# Patient Record
Sex: Female | Born: 1950 | Race: Black or African American | Hispanic: No | Marital: Married | Smoking: Former smoker
Health system: Southern US, Community
[De-identification: ages and names within clinical notes are randomized; demographics above are authoritative.]

## PROBLEM LIST (undated history)

## (undated) DIAGNOSIS — K219 Gastro-esophageal reflux disease without esophagitis: Secondary | ICD-10-CM

## (undated) DIAGNOSIS — F0390 Unspecified dementia without behavioral disturbance: Secondary | ICD-10-CM

## (undated) DIAGNOSIS — I10 Essential (primary) hypertension: Secondary | ICD-10-CM

## (undated) DIAGNOSIS — A0472 Enterocolitis due to Clostridium difficile, not specified as recurrent: Secondary | ICD-10-CM

## (undated) DIAGNOSIS — C649 Malignant neoplasm of unspecified kidney, except renal pelvis: Secondary | ICD-10-CM

## (undated) DIAGNOSIS — C55 Malignant neoplasm of uterus, part unspecified: Secondary | ICD-10-CM

## (undated) DIAGNOSIS — N289 Disorder of kidney and ureter, unspecified: Secondary | ICD-10-CM

## (undated) DIAGNOSIS — D649 Anemia, unspecified: Secondary | ICD-10-CM

## (undated) HISTORY — PX: AV FISTULA PLACEMENT: SHX1204

## (undated) HISTORY — PX: ABDOMINAL HYSTERECTOMY: SHX81

## (undated) HISTORY — PX: KIDNEY TRANSPLANT: SHX239

## (undated) HISTORY — PX: BREAST BIOPSY: SHX20

## (undated) HISTORY — PX: NEPHRECTOMY TRANSPLANTED ORGAN: SUR880

---

## 2014-02-21 ENCOUNTER — Inpatient Hospital Stay: Payer: Self-pay | Admitting: Internal Medicine

## 2014-02-21 LAB — COMPREHENSIVE METABOLIC PANEL
ANION GAP: 11 (ref 7–16)
AST: 21 U/L (ref 15–37)
Albumin: 2.6 g/dL — ABNORMAL LOW (ref 3.4–5.0)
Alkaline Phosphatase: 63 U/L
BUN: 16 mg/dL (ref 7–18)
Bilirubin,Total: 0.5 mg/dL (ref 0.2–1.0)
CHLORIDE: 104 mmol/L (ref 98–107)
CO2: 25 mmol/L (ref 21–32)
Calcium, Total: 8.2 mg/dL — ABNORMAL LOW (ref 8.5–10.1)
Creatinine: 1.68 mg/dL — ABNORMAL HIGH (ref 0.60–1.30)
EGFR (Non-African Amer.): 33 — ABNORMAL LOW
GFR CALC AF AMER: 40 — AB
Glucose: 120 mg/dL — ABNORMAL HIGH (ref 65–99)
Osmolality: 282 (ref 275–301)
POTASSIUM: 3.2 mmol/L — AB (ref 3.5–5.1)
SGPT (ALT): 14 U/L
Sodium: 140 mmol/L (ref 136–145)
TOTAL PROTEIN: 7 g/dL (ref 6.4–8.2)

## 2014-02-21 LAB — URINALYSIS, COMPLETE
Bilirubin,UR: NEGATIVE
GLUCOSE, UR: NEGATIVE mg/dL (ref 0–75)
Nitrite: POSITIVE
Ph: 6 (ref 4.5–8.0)
Protein: 100
RBC,UR: 2 /HPF (ref 0–5)
SPECIFIC GRAVITY: 1.013 (ref 1.003–1.030)
Squamous Epithelial: 8
WBC UR: 666 /HPF (ref 0–5)

## 2014-02-21 LAB — CBC
HCT: 31.7 % — ABNORMAL LOW (ref 35.0–47.0)
HGB: 10.2 g/dL — ABNORMAL LOW (ref 12.0–16.0)
MCH: 24.4 pg — ABNORMAL LOW (ref 26.0–34.0)
MCHC: 32.3 g/dL (ref 32.0–36.0)
MCV: 76 fL — AB (ref 80–100)
PLATELETS: 140 10*3/uL — AB (ref 150–440)
RBC: 4.19 10*6/uL (ref 3.80–5.20)
RDW: 14.8 % — ABNORMAL HIGH (ref 11.5–14.5)
WBC: 15.2 10*3/uL — AB (ref 3.6–11.0)

## 2014-02-22 LAB — BASIC METABOLIC PANEL
ANION GAP: 10 (ref 7–16)
BUN: 16 mg/dL (ref 7–18)
CALCIUM: 8.7 mg/dL (ref 8.5–10.1)
CO2: 25 mmol/L (ref 21–32)
Chloride: 105 mmol/L (ref 98–107)
Creatinine: 1.76 mg/dL — ABNORMAL HIGH (ref 0.60–1.30)
EGFR (African American): 38 — ABNORMAL LOW
EGFR (Non-African Amer.): 31 — ABNORMAL LOW
Glucose: 116 mg/dL — ABNORMAL HIGH (ref 65–99)
Osmolality: 282 (ref 275–301)
Potassium: 3.5 mmol/L (ref 3.5–5.1)
Sodium: 140 mmol/L (ref 136–145)

## 2014-02-22 LAB — CBC WITH DIFFERENTIAL/PLATELET
BASOS ABS: 0.1 10*3/uL (ref 0.0–0.1)
Basophil %: 0.8 %
EOS PCT: 0 %
Eosinophil #: 0 10*3/uL (ref 0.0–0.7)
HCT: 33.8 % — AB (ref 35.0–47.0)
HGB: 10.7 g/dL — ABNORMAL LOW (ref 12.0–16.0)
LYMPHS ABS: 1.1 10*3/uL (ref 1.0–3.6)
LYMPHS PCT: 7.3 %
MCH: 24.3 pg — ABNORMAL LOW (ref 26.0–34.0)
MCHC: 31.6 g/dL — ABNORMAL LOW (ref 32.0–36.0)
MCV: 77 fL — ABNORMAL LOW (ref 80–100)
Monocyte #: 0.9 x10 3/mm (ref 0.2–0.9)
Monocyte %: 6.2 %
NEUTROS ABS: 13 10*3/uL — AB (ref 1.4–6.5)
Neutrophil %: 85.7 %
PLATELETS: 131 10*3/uL — AB (ref 150–440)
RBC: 4.39 10*6/uL (ref 3.80–5.20)
RDW: 15.1 % — AB (ref 11.5–14.5)
WBC: 15.1 10*3/uL — ABNORMAL HIGH (ref 3.6–11.0)

## 2014-02-22 LAB — PHOSPHORUS: PHOSPHORUS: 3.7 mg/dL (ref 2.5–4.9)

## 2014-02-23 LAB — RENAL FUNCTION PANEL
Albumin: 2.1 g/dL — ABNORMAL LOW (ref 3.4–5.0)
Anion Gap: 8 (ref 7–16)
BUN: 14 mg/dL (ref 7–18)
CALCIUM: 7.8 mg/dL — AB (ref 8.5–10.1)
CHLORIDE: 106 mmol/L (ref 98–107)
Co2: 26 mmol/L (ref 21–32)
Creatinine: 1.35 mg/dL — ABNORMAL HIGH (ref 0.60–1.30)
EGFR (Non-African Amer.): 42 — ABNORMAL LOW
GFR CALC AF AMER: 51 — AB
Glucose: 90 mg/dL (ref 65–99)
Osmolality: 279 (ref 275–301)
PHOSPHORUS: 2.3 mg/dL — AB (ref 2.5–4.9)
POTASSIUM: 3.2 mmol/L — AB (ref 3.5–5.1)
Sodium: 140 mmol/L (ref 136–145)

## 2014-02-23 LAB — CBC WITH DIFFERENTIAL/PLATELET
BASOS PCT: 0.9 %
Basophil #: 0.1 10*3/uL (ref 0.0–0.1)
EOS PCT: 0.5 %
Eosinophil #: 0 10*3/uL (ref 0.0–0.7)
HCT: 28.5 % — AB (ref 35.0–47.0)
HGB: 9.1 g/dL — AB (ref 12.0–16.0)
LYMPHS ABS: 0.6 10*3/uL — AB (ref 1.0–3.6)
LYMPHS PCT: 6.6 %
MCH: 24.3 pg — ABNORMAL LOW (ref 26.0–34.0)
MCHC: 31.9 g/dL — ABNORMAL LOW (ref 32.0–36.0)
MCV: 76 fL — ABNORMAL LOW (ref 80–100)
MONOS PCT: 11.6 %
Monocyte #: 1.1 x10 3/mm — ABNORMAL HIGH (ref 0.2–0.9)
Neutrophil #: 7.3 10*3/uL — ABNORMAL HIGH (ref 1.4–6.5)
Neutrophil %: 80.4 %
Platelet: 115 10*3/uL — ABNORMAL LOW (ref 150–440)
RBC: 3.73 10*6/uL — AB (ref 3.80–5.20)
RDW: 14.6 % — ABNORMAL HIGH (ref 11.5–14.5)
WBC: 9 10*3/uL (ref 3.6–11.0)

## 2014-02-23 LAB — URINE CULTURE

## 2014-02-23 LAB — CLOSTRIDIUM DIFFICILE(ARMC)

## 2014-02-24 LAB — BASIC METABOLIC PANEL
ANION GAP: 6 — AB (ref 7–16)
BUN: 14 mg/dL (ref 7–18)
Calcium, Total: 7.8 mg/dL — ABNORMAL LOW (ref 8.5–10.1)
Chloride: 105 mmol/L (ref 98–107)
Co2: 28 mmol/L (ref 21–32)
Creatinine: 1.38 mg/dL — ABNORMAL HIGH (ref 0.60–1.30)
EGFR (African American): 50 — ABNORMAL LOW
EGFR (Non-African Amer.): 41 — ABNORMAL LOW
Glucose: 154 mg/dL — ABNORMAL HIGH (ref 65–99)
Osmolality: 281 (ref 275–301)
Potassium: 2.8 mmol/L — ABNORMAL LOW (ref 3.5–5.1)
SODIUM: 139 mmol/L (ref 136–145)

## 2014-02-24 LAB — FOLATE: FOLIC ACID: 25.9 ng/mL — AB (ref 3.1–17.5)

## 2014-02-24 LAB — TSH: THYROID STIMULATING HORM: 0.41 u[IU]/mL — AB

## 2014-02-25 LAB — URINALYSIS, COMPLETE
BACTERIA: NONE SEEN
Bilirubin,UR: NEGATIVE
GLUCOSE, UR: NEGATIVE mg/dL (ref 0–75)
Ketone: NEGATIVE
LEUKOCYTE ESTERASE: NEGATIVE
Nitrite: NEGATIVE
PROTEIN: NEGATIVE
Ph: 6 (ref 4.5–8.0)
Specific Gravity: 1.009 (ref 1.003–1.030)
Squamous Epithelial: 1

## 2014-02-25 LAB — T4, FREE: FREE THYROXINE: 1.37 ng/dL (ref 0.76–1.46)

## 2014-02-25 LAB — BASIC METABOLIC PANEL
Anion Gap: 8 (ref 7–16)
BUN: 11 mg/dL (ref 7–18)
CALCIUM: 7.7 mg/dL — AB (ref 8.5–10.1)
CO2: 24 mmol/L (ref 21–32)
Chloride: 110 mmol/L — ABNORMAL HIGH (ref 98–107)
Creatinine: 1.22 mg/dL (ref 0.60–1.30)
EGFR (African American): 57 — ABNORMAL LOW
EGFR (Non-African Amer.): 47 — ABNORMAL LOW
GLUCOSE: 90 mg/dL (ref 65–99)
Osmolality: 282 (ref 275–301)
POTASSIUM: 3.3 mmol/L — AB (ref 3.5–5.1)
Sodium: 142 mmol/L (ref 136–145)

## 2014-02-26 LAB — CULTURE, BLOOD (SINGLE)

## 2014-06-15 NOTE — H&P (Signed)
PATIENT NAME:  [name], [name] MR#:  [id number] DATE OF BIRTH:  Oct 28, 1950  DATE OF ADMISSION:  02/21/2014  REFERRING DOCTOR: Lennette Bihari A. Paduchowski, MD   PRIMARY CARE DOCTOR: Nonlocal.   ADMITTING DOCTOR: Juluis Mire, MD   CHIEF COMPLAINT:  1.  Fever and malaise  for the past 2 days.  2.  Dysuria for the past 3 to 4 days.   HISTORY OF PRESENT ILLNESS: A 64 year old African American female recently moved to [place] from [place] about 5 days ago, states that she has been having some urinary symptoms, namely frequency, urgency with dysuria for the past 3 to 4 days and this morning was noted to develop generalized weakness with malaise and fever. Hence brought to the Emergency Room for further evaluation. The  patient was residing in [place] and recently moved to [place] early part of this weekend, currently does not have a local doctor and is scheduled to be seen at [facility] for which she has an appointment in February 2016. Meanwhile she developed these urinary symptoms and fever with malaise. She does have a history of urinary tract infections. Last urinary tract infection was about 2 weeks ago for which she was treated with oral antibiotics and she does not know the name of the antibiotics. She does have some chills but denies any chest pain, no cough, no nausea, no vomiting, no diarrhea and no abdominal pain. She denies any blood in the urine. The patient has a history of renal transplantation done about 5 years ago and since then she has been doing okay until about a few months ago, that was about a couple of months ago, she had the first urinary tract infection, which was treated successfully with oral antibiotics and subsequently she had another urinary tract infection about 2 weeks ago, was treated with oral antibiotics at that time too.   In the Emergency Room, the patient was evaluated by the ED physician and was found to have a temperature of 102.3 degrees  Fahrenheit and the lab work revealed elevated white blood cell count of 15.2 and grossly abnormal urinalysis suggestive of urinary tract infection. Hence, hospitalist service was consulted for further evaluation and management. Blood cultures were obtained and patient was also started on IV ceftriaxone.    PAST MEDICAL HISTORY:  1.  Hypertension.  2.  Status post renal transplantation done about 5 years ago.  3.  History of renal cell carcinoma, status post left nephrectomy about 6 years ago.    PAST SURGICAL HISTORY:  1.  Status post left nephrectomy 6 years ago for renal carcinoma.  2.  Status post renal transplantation done about 5 years ago.  3.  Status post cholecystectomy in the remote past.   ALLERGIES: LATEX.   FAMILY HISTORY: Two brothers with end-stage renal disease, one sister with anemia and mother with hypertension and diabetes runs in many family members.   SOCIAL HISTORY: She is married, currently disabled, history of smoking in the past, quit about 20 years ago. Denies any alcohol or substance abuse. She just moved from [place] to [place] and currently living with her daughter.   HOME MEDICATIONS:  1.  Calcitriol 0.25 mcg oral capsule, 1 capsule orally once a day.  2.  Folic acid 1 mg oral tablet, 1 tablet orally once a day.  3.  Metoprolol 25 mg tablet orally, 1 tablet orally once a day.  4.  Mycophenolic acid 99991111 mg oral delayed release capsule, 1 capsule orally 2 times  a day.  5.  Ondansetron 4 mg oral tablet, 1 tablet orally every 8 hours as needed for nausea, vomiting.  6.  Prednisone 5 mg oral tablet, 1 tablet orally once a day.  7.  Sirolimus 1 mg tablet, 6 tablets orally once a day.    REVIEW OF SYSTEMS:  CONSTITUTIONAL: Positive for fever and chills and positive for generalized weakness and fatigue of 1 to 2 days' duration.  EYES: Negative for blurred vision, double vision. No pain. No redness. No discharge.  EARS, NOSE, AND THROAT: Negative for  tinnitus, ear pain, hearing loss, epistaxis, nasal discharge, difficulty swallowing.  RESPIRATORY: Negative for cough, wheezing, dyspnea, hemoptysis, painful respiration.  CARDIOVASCULAR: Negative for chest pain, palpitations, dizziness, syncopal episodes, orthopnea, dyspnea on exertion, pedal edema.  GASTROINTESTINAL: Negative for nausea, vomiting, diarrhea, abdominal pain, constipation, hematemesis, melena, rectal bleeding, GERD symptoms, change in bowel habits.  GENITOURINARY: Positive for dysuria, frequency and urgency for the past 4 to 5 days. No hematuria.  ENDOCRINE: Negative for polyuria, nocturia. No heat or cold intolerance.  HEMATOLOGIC AND LYMPHATICS: Negative for easy bruising, bleeding, swollen glands.  INTEGUMENTARY: Negative for acne, skin rash, or lesions.  MUSCULOSKELETAL: Negative for neck pain, back pain or shoulder pain. No history of arthritis, gout.  NEUROLOGICAL: Negative for focal weakness or numbness. No tremors. No history of CVA, TIA, seizure disorder.  PSYCHIATRIC: Negative for anxiety, insomnia, depression.   PHYSICAL EXAMINATION:  VITAL SIGNS: Temperature 102.3 degrees Fahrenheit, pulse rate of 100 per minute, respirations 27 per minute, blood pressure 152/89, O2 saturation is 96% on room air.  GENERAL: Well-built, well-nourished, alert, in no acute distress, comfortably resting in the bed at this time.  HEAD: Atraumatic, normocephalic.  EYES: Pupils equal, react to light and accommodation, no conjunctival pallor, no scleral icterus, extraocular movements intact.  NOSE: No drainage. No external lesions.  EARS: No drainage. No external lesions.  ORAL CAVITY: No mucosal lesions. No exudates.  NECK: Supple. No JVD. No thyromegaly. No carotid bruit. Range of motion of neck within normal limits.  RESPIRATORY: Good respiratory effort. Not using accessory muscles of respiration. Bilateral vesicular breath sounds present. No rales or rhonchi.  CARDIOVASCULAR: S1, S2  regular. No murmurs, gallops, or clicks appreciated. Peripheral pulses equal at carotid, femoral, and pedal pulses, no peripheral edema.  GASTROINTESTINAL: Abdomen soft, nontender. No hepatosplenomegaly. No masses. No rigidity. No guarding. Bowel sounds present and equal in all 4 quadrants. Transplanted kidney palpable in the right lower abdomen, no local tenderness.  GENITOURINARY: Deferred.  MUSCULOSKELETAL: No joint or tenderness or effusion. Range of motion adequate. Strength and tone equal bilaterally.  SKIN: Inspection within normal limits. No obvious wound.   LYMPHATIC: No cervical lymphadenopathy.  VASCULAR: Good dorsalis pedis and posterior tibial pulses. AV fistula on the left forearm with a palpable bruit present.  NEUROLOGICAL: Alert, awake, and oriented x 3. Cranial nerves II through XII grossly intact. No sensory deficit. Motor strength 5/5 in both upper and lower extremities. DTRs 2+ bilateral and symmetrical. Plantars downgoing.  PSYCHIATRIC: Alert, awake, and oriented x 3, judgment,  insight adequate.    ANCILLARY DATA:  LABS:  Serum glucose 120, BUN 16, creatinine 1.68, sodium 140, potassium 3.2, chloride 104, bicarbonate 25, total calcium 8.2, total protein 7.0, albumin 2.6, total bilirubin 0.5, alkaline phosphatase 63, AST is 21, ALT 14, WBC 15.2, hemoglobin 10.2, hematocrit 31.7, platelet count 140. Urine analysis: Cloudy, nitrite positive, leukocyte esterase 3+, WBCs 666 per high-power field, bacteria 2+, WBC clumps present.  IMAGING STUDY:  Chest x-ray:   No active disease.   ASSESSMENT AND PLAN: A 64 year old [place] American female with a past medical history of renal transplantation done about 5 years ago, hypertension, and history of renal carcinoma, status post left nephrectomy in the past, recently moved from [place] to [place], presents with fever, malaise of 2 day duration and dysuria for the past 3 to 4 days, found to have a fever of 102 degrees Fahrenheit  with leukocytosis and abnormal urinalysis consistent with urinary tract infection, rule out pyelonephritis.  1.  Urinary tract infection, rule out pyelonephritis.  Plan: Admit to medical floor. Blood and urine cultures obtained. IV antibiotics, Rocephin, Tylenol, follow up cultures and order renal ultrasound in view of transplanted kidney.  2.  History of renal transplantation done about 5 years ago, on immunosuppression medications. The patient is stable. Continue home medications.  3.  Hypertension, stable on home medications. Continue same.  4.  Elevated creatinine of 1.6. Baseline creatinine not known, acute versus chronic. Plan: Gentle IV hydration, monitor BMP, avoid nephrotoxic agents, obtain old medical records.  5.  Hypokalemia, mild. Plan: Potassium replacement, follow up BMP.  6.  Anemia, mild, seems chronic but obtain old medical records and monitor CBC and consider further workup accordingly.  7.  Deep vein thrombosis prophylaxis, subcutaneous Lovenox.  8.  Gastrointestinal prophylaxis- proton pump inhibitor.   CODE STATUS: Full code.   TIME SPENT: 55 minutes.   ____________________________ Juluis Mire, MD enr:AT D: 02/22/2014 00:54:08 ET T: 02/22/2014 01:25:11 ET JOB#: [id number]  cc: Juluis Mire, MD, <Dictator> Juluis Mire MD ELECTRONICALLY SIGNED 02/22/2014 15:34

## 2014-06-15 NOTE — Discharge Summary (Signed)
PATIENT NAME:  [name], BOLAN MR#:  [id number] DATE OF BIRTH:  1950-12-30  DATE OF ADMISSION:  02/21/2014 DATE OF DISCHARGE:  02/25/2014  ADMITTING PHYSICIAN: Juluis Mire, MD  DISCHARGING PHYSICIAN: Gladstone Lighter, MD   PRIMARY CARE PHYSICIAN: Nonlocal, in [place].  CONSULTATIONS IN THE HOSPITAL:  1.  Nephrology consultation with Mamie Levers, MD.  2.  Psychiatric consultation with Gonzella Lex, MD.  3.  Neurology consultation by Mila Homer Tamala Julian, MD.  DISCHARGE DIAGNOSES:  1.  Metabolic encephalopathy.  2.  Pseudodementia.  3.  Depression.  4.  Cognitive impairment.  5.  Acute renal failure.  6.  History of chronic kidney disease, status post renal transplant.  7.  Hypertension.  8.  Escherichia coli urinary tract infection.   DISCHARGE HOME MEDICATIONS:  1.  Prednisone 5 mg p.o. daily.  2.  Metoprolol 25 mg p.o. daily.  3.  Zofran 4 mg q. 8 hours p.r.n. for nausea and vomiting.  4.  Calcitriol 0.25 mcg oral capsule daily.  5.  Folic acid 1 mg p.o. daily.  6.  Sirolimus 6 mg p.o. daily.  7.  Mycophenolic acid 99991111 mg p.o. b.i.d.  8.  Levaquin 250 mg p.o. daily for 3 more days.  9.  Remeron 30 mg p.o. at bedtime.   DISCHARGE DIET: Low-sodium diet.   DISCHARGE ACTIVITY: As tolerated.    FOLLOWUP INSTRUCTIONS:  1.  Follow up with Dr. Juleen China in 1 week.  2.  PCP followup in 3 weeks.  3.  Outpatient physical therapy if needed.   LABORATORY AND IMAGING STUDIES PRIOR TO DISCHARGE: Urinalysis negative for any infection. Sodium 142, potassium 3.3, chloride 110, bicarbonate 24, BUN 11, creatinine 1.2, glucose 90, and calcium of 7.7. Free T4 is within normal limits at 1.37. Vitamin B12 is at 410 pg/mL. RPR test is nonreactive. Folic acid is elevated at 25. Stool for Clostridium difficile is negative. WBC 9.8, hemoglobin 9.1, hematocrit 28.3, platelet count 115,000.   Ultrasound of the kidneys bilaterally showing right kidney and left kidney are not  visualized. Right lower quadrant transplant kidney demonstrates diffusely increased parenchymal echotexture with loss of normal corticomedullary distinction.   Blood cultures are negative. Urine cultures growing pansensitive Escherichia coli.   Admission creatinine at 1.68, with a positive urine on admission.   BRIEF HOSPITAL COURSE: [name] is a 64 year old African American female with past medical history significant for end-stage renal disease, status post renal transplant, and hypertension who was brought in by her daughter secondary to fever, malaise and also cognitive impairment.  1.  Metabolic encephalopathy. Her change in mental status initially was thought secondary to metabolic encephalopathy from a urinary tract infection. Urine cultures were growing pansensitive Escherichia coli. She was appropriately treated and she will take Levaquin for 3 more days to finish off her course. However, her daughter was saying that the patient has been having waxing and waning mental status for almost 3 weeks; that was the reason the daughter got her from [place], as the patient was staying by herself and was having some memory issues. Initially thought to be dementia. CT of the head was done, which did not show any acute changes. Seen by a neurologist, who felt this was also due to dementia, triggered by a lot of stressors and worsened anxiety in life. A psychiatric consult was requested, and the patient was discharged on mirtazapine and was requested to follow up as an outpatient. However, with treatment of her underlying UTI, her mental status has  improved a lot, and she was oriented prior to discharge.  2.  Acute renal failure with history of renal transplant. Baseline creatinine not known; however, with gentle hydration and treatment of her urinary tract infection, creatinine improved up to 1.2 prior to discharge. She will follow with nephrology as an outpatient. All of her transplant medications were  continued at this time.   Her course has been otherwise uneventful in the hospital. She worked with physical therapy, who recommended outpatient services.   DISCHARGE CONDITION: Stable.   DISCHARGE DISPOSITION: Home.   TIME SPENT ON DISCHARGE: 40 minutes.    ____________________________ Gladstone Lighter, MD rk:MT D: 02/26/2014 11:24:00 ET T: 02/26/2014 15:14:41 ET JOB#: [id number]  cc: Gladstone Lighter, MD, <Dictator> Gladstone Lighter MD ELECTRONICALLY SIGNED 03/03/2014 16:19

## 2014-06-15 NOTE — Consult Note (Signed)
Referring Physician:  Azucena Freed :   Primary Care Physician:  Azucena Freed : [facility], [address], [place], [place] [address], [place] [phone]  Reason for Consult: Admit Date: 23-Feb-2014  Chief Complaint: confusion  Reason for Consult: confusion   History of Present Illness: History of Present Illness:   64 yo RHD F presents to [facility] from home due to increasing confusion.  Per daughter, pt originally lived in [place] but there have been a lot of health and personal issues going on so she bought pt back to [place].  Per family, over the past 3 weeks, pt has had a gradual decline in her mental status and has been forgetting more.  Pt knows that she is forgetting more and its stressing her out as well.  Pt denies headache or weakness or numbness.  Pt does report feeling sad.  ROS:  General fatigue   HEENT no complaints   Lungs no complaints   Cardiac no complaints   GI no complaints   GU no complaints   Musculoskeletal no complaints   Extremities no complaints   Skin no complaints   Neuro no complaints   Endocrine no complaints   Psych depression   Past Medical/Surgical Hx:  hypertension:   kidney transplant:   Past Medical/ Surgical Hx:  Past Medical History reviewed by me as above   Past Surgical History reviewed by me as above   Home Medications: Medication Instructions Last Modified Date/Time  calcitriol 0.25 mcg oral capsule 1 cap(s) orally once a day 08-Jan-16 23:10  predniSONE 5 mg oral tablet 1 tab(s) orally once a day 08-Jan-16 23:10  metoprolol 25 mg oral tablet 1 tab(s) orally once a day 08-Jan-16 23:10  sirolimus 1 mg oral tablet 6 tab(s) orally once a day 09-MM[facility]-68 08:81  folic acid 1 mg oral tablet 1 tab(s) orally once a day 08-Jan-16 10:31  mycophenolic acid 594 mg oral delayed release tablet 1 tab(s) orally 2 times a day 08-Jan-16 23:10  ondansetron 4 mg oral tablet 1 tab(s) orally every 8 hours, As Needed - for  Nausea, Vomiting 08-Jan-16 23:10   Allergies:  Latex: Hives  Allergies:  Allergies NKDA   Social/Family History: Employment Status: disabled  Lives With: children  Living Arrangements: apartment  Social History: no tob, no EtOH, no illicits  Family History: no seizures, no stroke   Vital Signs: **Vital Signs.:   11-Jan-16 08:26  Vital Signs Type Routine  Temperature Temperature (F) 98.2  Celsius 36.7  Temperature Source oral  Pulse Pulse 74  Respirations Respirations 18  Systolic BP Systolic BP 585  Diastolic BP (mmHg) Diastolic BP (mmHg) 71  Mean BP 83  Pulse Ox % Pulse Ox % 99  Pulse Ox Activity Level  At rest  Oxygen Delivery Room Air/ 21 %   Physical Exam: General: overweight, NAD, slightly depressed mood  HEENT: normocephalic, sclera nonicteric, oropharynx clear  Neck: supple, no JVD, no bruits  Chest: CTA B, no wheezing, good movement  Cardiac: RRR, no murmurs, no edema, 2+ pulses  Extremities: no C/C/E, FROM   Neurologic Exam: Mental Status: alert but oriented only to person and place, difficulty with concentration and short term memory, can only name 8 animals in one minute due to poor concentration, 23/30 MMSE due to above as well, nl speech and language  Cranial Nerves: PERRLA, EOMI, nl VF, face symmetric, tongue midline, shoulder shrug equal  Motor Exam: 5/5 B normal, tone, no tremor  Deep Tendon Reflexes: 1+/4 B, plantars  downgoing B, no Hoffman  Sensory Exam: pinprick, temperature, and vibration intact B  Coordination: FTN and HTS WNL   Lab Results: Hepatic:  08-Jan-16 21:53   Bilirubin, Total 0.5  Alkaline Phosphatase 63 (46-116 NOTE: New Reference Range 09/03/13)  SGPT (ALT) 14 (14-63 NOTE: New Reference Range 09/03/13)  SGOT (AST) 21  Total Protein, Serum 7.0  10-Jan-16 09:48   Albumin, Serum  2.1  Routine Micro:  08-Jan-16 21:53   Organism Name Escherichia coli  Organism Quantity >100,000 CFU/ML  Nitrofurantoin Sensitivity S   Cefazolin Sensitivity S  Ampicillin Sensitivity S  Ceftriaxone Sensitivity S  Ciprofloxacin Sensitivity S  Gentamicin Sensitivity S  Imipenem Sensitivity S  Levofloxacin Sensitivity S  Trimethoprim/Sulfamethoxazole Sensitivty S  Specimen Source CLEAN CATCH  Organism 1 >100,000 CFU/ML Escherichia coli  Result(s) reported on 23 Feb 2014 at 12:02PM.  Culture Comment ID TO FOLLOW SENSITIVITIES TO FOLLOW  Result(s) reported on 22 Feb 2014 at 11:01AM.  10-Jan-16 10:50   Micro Text Report CLOSTRIDIUM DIFFICILE   C.DIFFICILE ANTIGEN       C.DIFFICILE GDH ANTIGEN : NEGATIVE   C.DIFFICILE TOXIN A/B     C.DIFFICILE TOXINS A AND B : NEGATIVE   INTERPRETATION            Negative for C. difficile.    ANTIBIOTIC                        Routine Chem:  10-Jan-16 09:48   Phosphorus, Serum  2.3  11-Jan-16 10:12   Glucose, Serum  154  BUN 14  Creatinine (comp)  1.38  Sodium, Serum 139  Potassium, Serum  2.8  Chloride, Serum 105  CO2, Serum 28  Calcium (Total), Serum  7.8  Anion Gap  6  Osmolality (calc) 281  eGFR (African American)  50  eGFR (Non-African American)  41 (eGFR values <81m/min/1.73 m2 may be an indication of chronic kidney disease (CKD). Calculated eGFR, using the MRDR Study equation, is useful in  patients with stable renal function. The eGFR calculation will not be reliable in acutely ill patients when serum creatinine is changing rapidly. It is not useful in patients on dialysis. The eGFR calculation may not be applicable to patients at the low and high extremes of body sizes, pregnant women, and vegetarians.)  Routine UA:  08-Jan-16 21:53   Color (UA) Yellow  Clarity (UA) Cloudy  Glucose (UA) Negative  Bilirubin (UA) Negative  Ketones (UA) Trace  Specific Gravity (UA) 1.013  Blood (UA) 1+  pH (UA) 6.0  Protein (UA) 100 mg/dL  Nitrite (UA) Positive  Leukocyte Esterase (UA) 3+ (Result(s) reported on 21 Feb 2014 at 10:53PM.)  RBC (UA) 2 /HPF  WBC (UA) 666 /HPF   Bacteria (UA) 2+  Epithelial Cells (UA) 8 /HPF  WBC Clump (UA) PRESENT  Mucous (UA) PRESENT (Result(s) reported on 21 Feb 2014 at 10:53PM.)  Routine Hem:  10-Jan-16 09:48   WBC (CBC) 9.0  RBC (CBC)  3.73  Hemoglobin (CBC)  9.1  Hematocrit (CBC)  28.5  Platelet Count (CBC)  115  MCV  76  MCH  24.3  MCHC  31.9  RDW  14.6  Neutrophil % 80.4  Lymphocyte % 6.6  Monocyte % 11.6  Eosinophil % 0.5  Basophil % 0.9  Neutrophil #  7.3  Lymphocyte #  0.6  Monocyte #  1.1  Eosinophil # 0.0  Basophil # 0.1 (Result(s) reported on 23 Feb 2014 at 10:09AM.)   Radiology Results:  CT:    11-Jan-16 12:15, CT Head Without Contrast  CT Head Without Contrast   REASON FOR EXAM:    confusion  COMMENTS:       PROCEDURE: CT  - CT HEAD WITHOUT CONTRAST  - Feb 24 2014 12:15PM     CLINICAL DATA:  Altered mammary and confusion over the past 2-3  weeks.    EXAM:  CT HEAD WITHOUT CONTRAST    TECHNIQUE:  Contiguous axial images were obtained from the base of the skull  through the vertex without intravenous contrast.  COMPARISON:  None.    FINDINGS:  The ventricles are normal in size and configuration. No extra-axial  fluid collections are identified. The gray-white differentiation is  normal. No CT findings for acute intracranial process such as  hemorrhage or infarction. No mass lesions. The brainstem and  cerebellum are grossly normal.    The bony structures are intact. The paranasal sinuses and mastoid  air cells are clear. The globes are intact.     IMPRESSION:  No acute intracranial findings or mass lesions.  Electronically Signed    By: Kalman Jewels M.D.    On: 02/24/2014 12:47         Verified By: Marlane Hatcher, M.D.,   Radiology Impression: Radiology Impression: MRI of brain personally reviewed by me and shows trace atrophy and trace white matter changes   Impression/Recommendations: Recommendations:   EEG normal notes reviewed by me reviewed by me   Probable  pseudodementia-  pt has marked problems with concentration and is very anxious during exam.  Pt has a lot of stressors in her life including health and personal issues that is making this worse.  Highly doubt neurodegerative d/o or encephalopathy. psychiatric consult and probable neuropsych testing as outpatient check B12/folate, TSH and RPR to make sure they are not contributing will follow briefly  Electronic Signatures: Jamison Neighbor (MD)  (Signed 11-Jan-16 14:30)  Authored: REFERRING PHYSICIAN, Primary Care Physician, Consult, History of Present Illness, Review of Systems, PAST MEDICAL/SURGICAL HISTORY, HOME MEDICATIONS, ALLERGIES, Social/Family History, NURSING VITAL SIGNS, Physical Exam-, LAB RESULTS, RADIOLOGY RESULTS, Recommendations   Last Updated: 11-Jan-16 14:30 by Jamison Neighbor (MD)

## 2014-06-15 NOTE — Consult Note (Signed)
PATIENT NAME:  Sheri, Navarro MR#:  [id number] DATE OF BIRTH:  [birth year]  DATE OF CONSULTATION:  02/25/2014  CONSULTING PHYSICIAN:  Gonzella Lex, MD  IDENTIFYING INFORMATION AND REASON FOR CONSULTATION:  A 64 year old woman with minimal past known psychiatric history, who presented to the hospital several days ago with fever and sepsis from urinary tract infection. 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   CHIEF COMPLAINT:  "My memory is bad."   HISTORY OF PRESENT ILLNESS:  Information is obtained from the patient and the chart. Even after recovering from her urinary tract infection, the patient was noted to be slow in her thinking with impaired memory. Neurology was consulted. Dr. 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 That has been going on for months, but seems to be getting a little bit worse. She has difficulty sleeping at night, both falling asleep and waking up a lot. Appetite has been poor, and she says she has lost about 50 pounds in the last year. The patient denies any thought about killing herself, but says she does feel hopeless at times. The biggest stress she can identify relates to her separation from her husband. Even after talking to the patient and her daughter, I am still not clear on the timeline. It sounds like she and her husband have been separated for quite a while, but that it has been relatively recently that he struck up an affair with the patient's younger sister. According to both the patient and her daughter, this has been an extreme increase in stress on her that has made the patient unable to continue taking care of herself and so she has located herself from  [place] down here to [place] just within the last couple of weeks. She is not currently getting any kind of treatment for depression.   PAST PSYCHIATRIC HISTORY:  She says that she used to drink, but that it has been over 20 years ago since she was drinking quite a bit. She thinks that she may have seen a psychiatrist once, but cannot be sure. She does not know of any psychiatric medicine. No psychiatric hospitalization. No history of suicide attempts.   SOCIAL HISTORY:  Separated from her husband. She has an adult daughter who lives down here in [place], with whom she is now living. Two adult sons still live in [place], [place]. The patient said she had to get out of [facility] because it is too expensive. She was not a very good historian at telling me the full timeline of her recent move or explaining it in detail.   FAMILY HISTORY:  Positive family history of drinking and drug abuse.   PAST MEDICAL HISTORY:  The patient has renal failure and was on dialysis for years, but then got a kidney transplant. She has been status post kidney transplant for a few years now. She has a history of recurrent urinary tract infections, history of high blood pressure, and diarrhea, which is improved.   CURRENT MEDICATIONS:  Calcitriol 0.25 mg per day, levofloxacin 250 mg a day, metoprolol 25 mg a day, mycophenolic acid [id number] mg a day, prednisone 5 mg a day, Zantac 150 mg twice a day, potassium 40  mEq a day.   ALLERGIES:  LATEX.   SOCIAL HISTORY:  Just recently relocated to [place] from [place] to live with her daughter.   REVIEW OF SYSTEMS:  Feels hopeless, down, and negative. Poor sleep. Losing weight. Denies suicidal ideation. Denies hallucinations. Energy level is feeling better. No acute pain.   MENTAL STATUS EXAMINATION:  Slightly disheveled woman who looks older than her stated age. Cooperative with the interview. Good eye contact. Normal psychomotor activity. Speech is  normal rate, tone, and volume. Affect is somewhat flattish. Mood is stated as being worried. Thoughts are lucid but slow. Sometimes, her story does not hang together very well. She cannot remember many details. Denies auditory or visual hallucinations. Denies suicidal or homicidal ideation. She is oriented to her situation and the year. She can repeat 3 words immediately, but remembers none of them at 3 minutes. Dr. 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 Judgment and insight are possibly impaired. Intelligence is probably average at baseline.   LABORATORY RESULTS:  I had a repeat urinalysis done today just to make sure that her UTI was treated, and it is now normal. TSH is normal. Stabilizing chemistry profile. Head CT shows no acute intracranial findings.   VITAL SIGNS:  At the time of discharge, blood pressure 151/82, pulse 67, respirations 18, temperature 98.1.   ASSESSMENT:  A 64 year old woman who is reporting multiple symptoms that would be consistent with depression. Collateral information from her daughter is that the patient has been under a lot of stress recently. The patient is particularly interested in wanting to get some sleep. She could have some chronic cognitive impairment from her years on dialysis and the toll taken by that and by the transplant, but I agree it sounds like at least some of this is cognitive impairment from depression. She is not acutely suicidal or requiring hospital level treatment, but would benefit from followup treatment.   TREATMENT PLAN:  Reviewed history and treatment plan with the patient. Start mirtazapine 30 mg at night for depression. Side effects discussed. The patient agrees to the plan. Include this in her discharge information:  She should follow up with a primary care doctor.   DIAGNOSIS, PRINCIPAL AND PRIMARY:  AXIS I:  Depression, not otherwise specified.   SECONDARY DIAGNOSES: AXIS I:  Cognitive  impairment, unclear etiology, possibly related to depression.   AXIS II:  Deferred.   AXIS III:  Recent urinary tract infection, history of renal transplant, multiple life stresses.   ____________________________ Gonzella Lex, MD jtc:nb D: 02/25/2014 22:53:48 ET T: 02/26/2014 01:57:26 ET JOB#: [id number]  cc: Gonzella Lex, MD, <Dictator> Gonzella Lex MD ELECTRONICALLY SIGNED 03/26/2014 17:17

## 2014-06-15 NOTE — Consult Note (Signed)
Brief Consult Note: Diagnosis: depression nos.   Patient was seen by consultant.   Consult note dictated.   Recommend further assessment or treatment.   Orders entered.   Discussed with Attending MD.   Comments: Psychiatry: PAtient seen, chart reviewwed, discussed with hhospitalist and with daughter. PAtient with cognitive problems but also some symptoms c/w depression. Suggest trial of mirtazapine 30mg  qhs. Patient agrees. Script added. Will need follow up with PCP at least.  Electronic Signatures: Gonzella Lex (MD)  (Signed 12-Jan-16 16:31)  Authored: Brief Consult Note   Last Updated: 12-Jan-16 16:31 by Gonzella Lex (MD)

## 2014-07-06 ENCOUNTER — Encounter: Payer: Self-pay | Admitting: *Deleted

## 2014-07-06 ENCOUNTER — Inpatient Hospital Stay
Admission: EM | Admit: 2014-07-06 | Discharge: 2014-07-08 | DRG: 699 | Disposition: A | Discharge status: Home / Self Care | Payer: Medicare Other | Attending: Internal Medicine | Admitting: Internal Medicine

## 2014-07-06 DIAGNOSIS — N2581 Secondary hyperparathyroidism of renal origin: Secondary | ICD-10-CM | POA: Diagnosis present

## 2014-07-06 DIAGNOSIS — N179 Acute kidney failure, unspecified: Secondary | ICD-10-CM | POA: Diagnosis present

## 2014-07-06 DIAGNOSIS — Z87891 Personal history of nicotine dependence: Secondary | ICD-10-CM

## 2014-07-06 DIAGNOSIS — Y830 Surgical operation with transplant of whole organ as the cause of abnormal reaction of the patient, or of later complication, without mention of misadventure at the time of the procedure: Secondary | ICD-10-CM | POA: Diagnosis present

## 2014-07-06 DIAGNOSIS — D649 Anemia, unspecified: Secondary | ICD-10-CM | POA: Diagnosis present

## 2014-07-06 DIAGNOSIS — Z79899 Other long term (current) drug therapy: Secondary | ICD-10-CM

## 2014-07-06 DIAGNOSIS — Z7952 Long term (current) use of systemic steroids: Secondary | ICD-10-CM | POA: Diagnosis not present

## 2014-07-06 DIAGNOSIS — T8611 Kidney transplant rejection: Secondary | ICD-10-CM | POA: Diagnosis present

## 2014-07-06 DIAGNOSIS — I10 Essential (primary) hypertension: Secondary | ICD-10-CM | POA: Diagnosis present

## 2014-07-06 DIAGNOSIS — Z85528 Personal history of other malignant neoplasm of kidney: Secondary | ICD-10-CM

## 2014-07-06 DIAGNOSIS — Z905 Acquired absence of kidney: Secondary | ICD-10-CM | POA: Diagnosis present

## 2014-07-06 DIAGNOSIS — R233 Spontaneous ecchymoses: Secondary | ICD-10-CM

## 2014-07-06 DIAGNOSIS — Z940 Kidney transplant status: Secondary | ICD-10-CM

## 2014-07-06 DIAGNOSIS — R252 Cramp and spasm: Secondary | ICD-10-CM | POA: Diagnosis present

## 2014-07-06 HISTORY — DX: Essential (primary) hypertension: I10

## 2014-07-06 HISTORY — DX: Gastro-esophageal reflux disease without esophagitis: K21.9

## 2014-07-06 LAB — COMPREHENSIVE METABOLIC PANEL
ALT: 17 U/L (ref 14–54)
ANION GAP: 6 (ref 5–15)
AST: 23 U/L (ref 15–41)
Albumin: 3.5 g/dL (ref 3.5–5.0)
Alkaline Phosphatase: 42 U/L (ref 38–126)
BILIRUBIN TOTAL: 0.3 mg/dL (ref 0.3–1.2)
BUN: 28 mg/dL — ABNORMAL HIGH (ref 6–20)
CO2: 27 mmol/L (ref 22–32)
Calcium: 8.8 mg/dL — ABNORMAL LOW (ref 8.9–10.3)
Chloride: 109 mmol/L (ref 101–111)
Creatinine, Ser: 1.31 mg/dL — ABNORMAL HIGH (ref 0.44–1.00)
GFR calc Af Amer: 49 mL/min — ABNORMAL LOW (ref >60)
GFR calc non Af Amer: 42 mL/min — ABNORMAL LOW (ref >60)
Glucose, Bld: 104 mg/dL — ABNORMAL HIGH (ref 65–99)
Potassium: 3.9 mmol/L (ref 3.5–5.1)
Sodium: 142 mmol/L (ref 135–145)
Total Protein: 6.8 g/dL (ref 6.5–8.1)

## 2014-07-06 LAB — CBC WITH DIFFERENTIAL/PLATELET
BASOS PCT: 1 %
Basophils Absolute: 0.1 10*3/uL (ref 0–0.1)
EOS ABS: 0.1 10*3/uL (ref 0–0.7)
Eosinophils Relative: 2 %
HCT: 30.1 % — ABNORMAL LOW (ref 35.0–47.0)
Hemoglobin: 9.6 g/dL — ABNORMAL LOW (ref 12.0–16.0)
LYMPHS PCT: 28 %
Lymphs Abs: 1.2 10*3/uL (ref 1.0–3.6)
MCH: 25.3 pg — ABNORMAL LOW (ref 26.0–34.0)
MCHC: 32 g/dL (ref 32.0–36.0)
MCV: 79.2 fL — ABNORMAL LOW (ref 80.0–100.0)
MONOS PCT: 16 %
Monocytes Absolute: 0.7 10*3/uL (ref 0.2–0.9)
Neutro Abs: 2.3 10*3/uL (ref 1.4–6.5)
Neutrophils Relative %: 53 %
PLATELETS: 152 10*3/uL (ref 150–440)
RBC: 3.8 MIL/uL (ref 3.80–5.20)
RDW: 15.5 % — AB (ref 11.5–14.5)
WBC: 4.4 10*3/uL (ref 3.6–11.0)

## 2014-07-06 LAB — PROTIME-INR
INR: 0.98
PROTHROMBIN TIME: 13.2 s (ref 11.4–15.0)

## 2014-07-06 LAB — APTT: APTT: 34 s (ref 24–36)

## 2014-07-06 MED ORDER — HYDRALAZINE HCL 20 MG/ML IJ SOLN
10.0000 mg | INTRAMUSCULAR | Status: DC | PRN
  Administered 2014-07-06: 10 mg via INTRAVENOUS

## 2014-07-06 MED ORDER — ONDANSETRON HCL 4 MG PO TABS: 4.0000 mg | ORAL_TABLET | Freq: Four times a day (QID) | ORAL | Status: DC | PRN

## 2014-07-06 MED ORDER — MORPHINE SULFATE 2 MG/ML IJ SOLN: 2.0000 mg | INTRAMUSCULAR | Status: DC | PRN

## 2014-07-06 MED ORDER — ACETAMINOPHEN 650 MG RE SUPP: 650.0000 mg | Freq: Four times a day (QID) | RECTAL | Status: DC | PRN

## 2014-07-06 MED ORDER — METHYLPREDNISOLONE SODIUM SUCC 125 MG IJ SOLR
INTRAMUSCULAR | Status: AC
  Administered 2014-07-06: 125 mg via INTRAVENOUS
  Filled 2014-07-06: qty 2

## 2014-07-06 MED ORDER — HYDRALAZINE HCL 20 MG/ML IJ SOLN
INTRAMUSCULAR | Status: AC
  Filled 2014-07-06: qty 1

## 2014-07-06 MED ORDER — METHYLPREDNISOLONE SODIUM SUCC 125 MG IJ SOLR
60.0000 mg | Freq: Every day | INTRAMUSCULAR | Status: DC
  Administered 2014-07-07 – 2014-07-08 (×2): 60 mg via INTRAVENOUS
  Filled 2014-07-06 (×2): qty 2

## 2014-07-06 MED ORDER — ACETAMINOPHEN 325 MG PO TABS: 650.0000 mg | ORAL_TABLET | Freq: Four times a day (QID) | ORAL | Status: DC | PRN

## 2014-07-06 MED ORDER — SODIUM CHLORIDE 0.9 % IV SOLN
INTRAVENOUS | Status: DC
  Administered 2014-07-07 – 2014-07-08 (×4): via INTRAVENOUS

## 2014-07-06 MED ORDER — METHYLPREDNISOLONE SODIUM SUCC 125 MG IJ SOLR
125.0000 mg | Freq: Once | INTRAMUSCULAR | Status: AC
  Administered 2014-07-06: 125 mg via INTRAVENOUS

## 2014-07-06 MED ORDER — ONDANSETRON HCL 4 MG/2ML IJ SOLN: 4.0000 mg | Freq: Four times a day (QID) | INTRAMUSCULAR | Status: DC | PRN

## 2014-07-06 NOTE — H&P (Signed)
[facility] NAME: [name]    MR#:  [id number]  DATE OF BIRTH:  [birth year]   DATE OF ADMISSION:  07/06/2014  PRIMARY CARE PHYSICIAN: No PCP Per Patient   REQUESTING/REFERRING PHYSICIAN: Owens Shark  CHIEF COMPLAINT:   Chief Complaint  Patient presents with  . Bleeding/Bruising    HISTORY OF PRESENT ILLNESS:  [name]  is a 64 y.o. female with a known history of renal cell carcinoma status post right-sided nephrectomy, status post renal transplant 5 years ago presenting with unexplained bruising. She is a somewhat poor historian however states she is presenting to the hospital for bruising bilateral upper extremities which is been present for over one week total duration. She also attests to having some cramping in her extremities, as well as subjective chills. She denies any further symptomatology Emergency department course: Given laboratory data worsening GFR, case was discussed with nephrology who have concern for transplant rejection thus requested admission  PAST MEDICAL HISTORY:   Past Medical History  Diagnosis Date  . Cancer     PAST SURGICAL HISTORY:   Past Surgical History  Procedure Laterality Date  . Kidney transplant    . Abdominal hysterectomy      SOCIAL HISTORY:   History  Substance Use Topics  . Smoking status: Former Research scientist (life sciences)  . Smokeless tobacco: Not on file  . Alcohol Use: No    FAMILY HISTORY:  History reviewed. No pertinent family history.  DRUG ALLERGIES:   Allergies  Allergen Reactions  . Lisinopril Cough    REVIEW OF SYSTEMS:  REVIEW OF SYSTEMS:  CONSTITUTIONAL: Denies fevers, fatigue, weakness. Positive subjective chills EYES: Denies blurred vision, double vision, or eye pain.  EARS, NOSE, THROAT: Denies tinnitus, ear pain, hearing loss.  RESPIRATORY: denies cough, shortness of breath, wheezing  CARDIOVASCULAR: Denies chest pain, palpitations, edema.   GASTROINTESTINAL: Denies nausea, vomiting, diarrhea, abdominal pain.  GENITOURINARY: Denies dysuria, hematuria.  ENDOCRINE: Denies nocturia or thyroid problems. HEMATOLOGIC AND LYMPHATIC: Positive for easy bruising, denies bleeding SKIN: Denies rash or lesions.  MUSCULOSKELETAL: Denies pain in neck, back, shoulder, knees, hips, or further arthritic symptoms.  NEUROLOGIC: Denies paralysis, paresthesias.  PSYCHIATRIC: Denies anxiety or depressive symptoms. Otherwise full review of systems performed by me is negative.   MEDICATIONS AT HOME:   Prior to Admission medications   Medication Sig Start Date End Date Taking? Authorizing Provider  calcitRIOL (ROCALTROL) 0.25 MCG capsule Take 0.25 mcg by mouth daily.   Yes Historical Provider, MD  folic acid (FOLVITE) 1 MG tablet Take 1 mg by mouth daily.   Yes Historical Provider, MD  metoprolol succinate (TOPROL-XL) 25 MG 24 hr tablet Take 25 mg by mouth daily.   Yes Historical Provider, MD  mycophenolate (MYFORTIC) 180 MG EC tablet Take 180 mg by mouth 2 (two) times daily.   Yes Historical Provider, MD  predniSONE (DELTASONE) 5 MG tablet Take 5 mg by mouth daily with breakfast.   Yes Historical Provider, MD  sirolimus (RAPAMUNE) 1 MG tablet Take 6 mg by mouth daily.   Yes Historical Provider, MD      VITAL SIGNS:  Blood pressure 183/74, pulse 62, temperature 97.9 F (36.6 C), temperature source Oral, resp. rate 16, height 4\' 11"  (1.499 m), weight 200 lb (90.719 kg), SpO2 97 %.  PHYSICAL EXAMINATION:  VITAL SIGNS: Filed Vitals:   07/06/14 2300  BP: 183/74  Pulse: 62  Temp:   Resp:    GENERAL:63 y.o.female currently  in no acute distress.  HEAD: Normocephalic, atraumatic.  EYES: Pupils equal, round, reactive to light. Extraocular muscles intact. No scleral icterus.  MOUTH: Moist mucosal membrane. Dentition intact. No abscess noted.  EAR, NOSE, THROAT: Clear without exudates. No external lesions.  NECK: Supple. No thyromegaly. No  nodules. No JVD.  PULMONARY: Clear to ascultation, without wheeze rails or rhonci. No use of accessory muscles, Good respiratory effort. good air entry bilaterally CHEST: Nontender to palpation.  CARDIOVASCULAR: S1 and S2. Regular rate and rhythm. No murmurs, rubs, or gallops. No edema. Pedal pulses 2+ bilaterally.  GASTROINTESTINAL: Soft, nontender, nondistended. No masses. Positive bowel sounds. No hepatosplenomegaly.  MUSCULOSKELETAL: No swelling, clubbing, or edema. Range of motion full in all extremities.  NEUROLOGIC: Cranial nerves II through XII are intact. No gross focal neurological deficits. Sensation intact. Reflexes intact.  SKIN: No ulceration, lesions, rashes, or cyanosis. Skin warm and dry. Turgor intact. Small areas of ecchymosis bilateral upper extremities PSYCHIATRIC: Mood, affect within normal limits. The patient is awake, alert and oriented x 3. Insight, judgment intact.    LABORATORY PANEL:   CBC  Recent Labs Lab 07/06/14 2035  WBC 4.4  HGB 9.6*  HCT 30.1*  PLT 152   ------------------------------------------------------------------------------------------------------------------  Chemistries   Recent Labs Lab 07/06/14 2035  NA 142  K 3.9  CL 109  CO2 27  GLUCOSE 104*  BUN 28*  CREATININE 1.31*  CALCIUM 8.8*  AST 23  ALT 17  ALKPHOS 42  BILITOT 0.3   ------------------------------------------------------------------------------------------------------------------  Cardiac Enzymes No results for input(s): TROPONINI in the last 168 hours. ------------------------------------------------------------------------------------------------------------------  RADIOLOGY:  No results found.  EKG:  No orders found for this or any previous visit.  IMPRESSION AND PLAN:   64 year old African female history of renal cell carcinoma status post nephrectomy status post transplant presenting with bruising for one week total duration. Case discussed from ER  staff to nephrology concern for transplant rejection.  1. Renal transplant rejection: Received Solu-Medrol, continue with further steroids, sirolimus, Myfortic, consult nephrology as they are already aware of the case 2. Essential hypertension, poorly controlled: Restart home medications as when necessary hydralazine 3.Venous thromboembolism prophylactic: Heparin subcutaneous      All the records are reviewed and case discussed with ED provider. Management plans discussed with the patient, family and they are in agreement.  CODE STATUS: Full  TOTAL TIME TAKING CARE OF THIS PATIENT: 35 minutes.    Hower,  Karenann Cai.D on 07/06/2014 at 11:27 PM  Between 7am to 6pm - Pager - [phone]  After 6pm: House Pager: - [phone]  [facility]  Office  [phone]  CC: Primary care physician; No PCP Per Patient

## 2014-07-06 NOTE — ED Notes (Signed)
Patient states that she noticed bruising to bilateral arms last night with no injury. Patient denies taking blood thinners. Patient has a history of kidney transplant 5 years ago.

## 2014-07-06 NOTE — ED Provider Notes (Signed)
[facility] Emergency Department Provider Note  ____________________________________________  Time seen: 9:30 PM  I have reviewed the triage vital signs and the nursing notes.   HISTORY  Chief Complaint Bleeding/Bruising     HPI [name] is a 64 y.o. female presents with nontraumatic bruising in the bilateral upper extremities with no history of injury times one day. Patient denies any anticoagulation. Of note patient had a renal transplant performed 5 years ago at [facility] in [place]. Patient was last seen by Dr. Candiss Norse (nephrology) one month ago point was scheduled for October)   Past Medical History  Diagnosis Date  . Cancer     There are no active problems to display for this patient.   Past Surgical History  Procedure Laterality Date  . Kidney transplant    . Abdominal hysterectomy      No current outpatient prescriptions on file.  Allergies Lisinopril  History reviewed. No pertinent family history.  Social History History  Substance Use Topics  . Smoking status: Former Research scientist (life sciences)  . Smokeless tobacco: Not on file  . Alcohol Use: No    Review of Systems  Constitutional: Negative for fever. Eyes: Negative for visual changes. ENT: Negative for sore throat. Cardiovascular: Negative for chest pain. Respiratory: Negative for shortness of breath. Gastrointestinal: Negative for abdominal pain, vomiting and diarrhea. Genitourinary: Negative for dysuria. Musculoskeletal: Negative for back pain. Skin: Bilateral upper extremity bruising Neurological: Negative for headaches, focal weakness or numbness.   10-point ROS otherwise negative.  ____________________________________________   PHYSICAL EXAM:  VITAL SIGNS: ED Triage Vitals  Enc Vitals Group     BP 07/06/14 2023 181/80 mmHg     Pulse Rate 07/06/14 2023 76     Resp 07/06/14 2023 18     Temp 07/06/14 2023 97.9 F (36.6 C)     Temp Source 07/06/14 2023 Oral      SpO2 07/06/14 2023 97 %     Weight 07/06/14 2023 200 lb (90.719 kg)     Height 07/06/14 2023 4\' 11"  (1.499 m)     Head Cir --      Peak Flow --      Pain Score 07/06/14 2023 6     Pain Loc --      Pain Edu? --      Excl. in [place]? --      Constitutional: Alert and oriented. Well appearing and in no distress. Eyes: Conjunctivae are normal. PERRL. Normal extraocular movements. ENT   Head: Normocephalic and atraumatic.   Nose: No congestion/rhinnorhea.   Mouth/Throat: Mucous membranes are moist.   Neck: No stridor. Cardiovascular: Normal rate, regular rhythm. Normal and symmetric distal pulses are present in all extremities. No murmurs, rubs, or gallops. Respiratory: Normal respiratory effort without tachypnea nor retractions. Breath sounds are clear and equal bilaterally. No wheezes/rales/rhonchi. Gastrointestinal: Soft and nontender. No distention. There is no CVA tenderness. Genitourinary: deferred Musculoskeletal: Nontender with normal range of motion in all extremities. No joint effusions.  No lower extremity tenderness nor edema. Neurologic:  Normal speech and language. No gross focal neurologic deficits are appreciated. Speech is normal.  Skin:  Bilateral upper extremity bruising noted. Psychiatric: Mood and affect are normal. Speech and behavior are normal. Patient exhibits appropriate insight and judgment.  ____________________________________________    LABS (pertinent positives/negatives)  Labs Reviewed  CBC WITH DIFFERENTIAL/PLATELET - Abnormal; Notable for the following:    Hemoglobin 9.6 (*)    HCT 30.1 (*)    MCV 79.2 (*)  MCH 25.3 (*)    RDW 15.5 (*)    All other components within normal limits  COMPREHENSIVE METABOLIC PANEL - Abnormal; Notable for the following:    Glucose, Bld 104 (*)    BUN 28 (*)    Creatinine, Ser 1.31 (*)    Calcium 8.8 (*)    GFR calc non Af Amer 42 (*)    GFR calc Af Amer 49 (*)    All other components within normal  limits  PROTIME-INR  APTT     ________   ____________________________________________   INITIAL IMPRESSION / ASSESSMENT AND PLAN / ED COURSE  Pertinent labs & imaging results that were available during my care of the patient were reviewed by me and considered in my medical decision making (see chart for details).  History of physical exam, lab data concerning for renal transplant rejection. As such patient was discussed with Dr. Harvest Dark (nephrology) who agreed with this possibility given decrease in GFR from 64 in the office currently 49 in the emergency department. Dr. Landry Mellow Route agreed to IV Solu-Medrol administration here in the emergency department. He requested that the patient be admitted here at [facility] for further evaluation. As such patient was discussed with Dr. Lavetta Nielsen for hospital admission.  ____________________________________________   FINAL CLINICAL IMPRESSION(S) / ED DIAGNOSES  Final diagnoses:  Bruising, spontaneous  Acute renal transplant rejection      Gregor Hams, MD 07/06/14 2147

## 2014-07-07 ENCOUNTER — Encounter: Payer: Self-pay | Admitting: *Deleted

## 2014-07-07 LAB — C DIFFICILE QUICK SCREEN W PCR REFLEX
C DIFFICILE (CDIFF) INTERP: NEGATIVE
C DIFFICILE (CDIFF) TOXIN: NOT DETECTED
C Diff antigen: NOT DETECTED

## 2014-07-07 LAB — BASIC METABOLIC PANEL
ANION GAP: 6 (ref 5–15)
BUN: 27 mg/dL — ABNORMAL HIGH (ref 6–20)
CALCIUM: 8.4 mg/dL — AB (ref 8.9–10.3)
CO2: 23 mmol/L (ref 22–32)
Chloride: 108 mmol/L (ref 101–111)
Creatinine, Ser: 1.15 mg/dL — ABNORMAL HIGH (ref 0.44–1.00)
GFR calc Af Amer: 57 mL/min — ABNORMAL LOW (ref >60)
GFR, EST NON AFRICAN AMERICAN: 50 mL/min — AB (ref >60)
GLUCOSE: 192 mg/dL — AB (ref 65–99)
POTASSIUM: 3.8 mmol/L (ref 3.5–5.1)
Sodium: 137 mmol/L (ref 135–145)

## 2014-07-07 LAB — CBC
HCT: 32.1 % — ABNORMAL LOW (ref 35.0–47.0)
Hemoglobin: 10.4 g/dL — ABNORMAL LOW (ref 12.0–16.0)
MCH: 25.4 pg — AB (ref 26.0–34.0)
MCHC: 32.6 g/dL (ref 32.0–36.0)
MCV: 77.9 fL — ABNORMAL LOW (ref 80.0–100.0)
Platelets: 164 10*3/uL (ref 150–440)
RBC: 4.11 MIL/uL (ref 3.80–5.20)
RDW: 15.6 % — AB (ref 11.5–14.5)
WBC: 4.9 10*3/uL (ref 3.6–11.0)

## 2014-07-07 MED ORDER — CALCITRIOL 0.25 MCG PO CAPS
0.2500 ug | ORAL_CAPSULE | Freq: Every day | ORAL | Status: DC
  Administered 2014-07-07 – 2014-07-08 (×2): 0.25 ug via ORAL
  Filled 2014-07-07 (×2): qty 1

## 2014-07-07 MED ORDER — METOPROLOL SUCCINATE ER 25 MG PO TB24
25.0000 mg | ORAL_TABLET | Freq: Every day | ORAL | Status: DC
  Administered 2014-07-07 – 2014-07-08 (×2): 25 mg via ORAL
  Filled 2014-07-07 (×2): qty 1

## 2014-07-07 MED ORDER — DIPHENHYDRAMINE HCL 25 MG PO CAPS
25.0000 mg | ORAL_CAPSULE | Freq: Once | ORAL | Status: AC
  Administered 2014-07-07: 25 mg via ORAL
  Filled 2014-07-07: qty 1

## 2014-07-07 MED ORDER — FOLIC ACID 1 MG PO TABS
1.0000 mg | ORAL_TABLET | Freq: Every day | ORAL | Status: DC
  Administered 2014-07-07 – 2014-07-08 (×2): 1 mg via ORAL
  Filled 2014-07-07 (×2): qty 1

## 2014-07-07 MED ORDER — MYCOPHENOLATE SODIUM 180 MG PO TBEC
180.0000 mg | DELAYED_RELEASE_TABLET | Freq: Two times a day (BID) | ORAL | Status: DC
  Administered 2014-07-07 – 2014-07-08 (×3): 180 mg via ORAL
  Filled 2014-07-07 (×5): qty 1

## 2014-07-07 MED ORDER — SIROLIMUS 1 MG PO TABS
6.0000 mg | ORAL_TABLET | Freq: Every day | ORAL | Status: DC
  Administered 2014-07-07 – 2014-07-08 (×2): 6 mg via ORAL
  Filled 2014-07-07: qty 6
  Filled 2014-07-07: qty 1
  Filled 2014-07-07: qty 6

## 2014-07-07 NOTE — Progress Notes (Signed)
Initial Nutrition Assessment  DOCUMENTATION CODES:     INTERVENTION:   (Meals and snacks: Cater to pt prefences. )  NUTRITION DIAGNOSIS:   (no nutritional dx at this time) related to   as evidenced by  .    GOAL:  Other (Comment) (Goal would be for pt to eat at least 85% of meals)    MONITOR:   (Energy intake, Electrolyte and renal profile)  REASON FOR ASSESSMENT:  Other (Comment) (diagnosis)    ASSESSMENT:  Pt admitted with bleeding, bruising. Has not been taking antirejection medication for the last 1-2 weeks secondary to recent move.  Kidney transplant 5 years ago  Past Medical History  Diagnosis Date  . Cancer   . Hypertension   . GERD (gastroesophageal reflux disease)     Electrolyte and Renal Profile:    Recent Labs Lab 07/06/14 2035 07/07/14 0457  BUN 28* 27*  CREATININE 1.31* 1.15*  NA 142 137  K 3.9 3.8   Medications: NS at [id number], solumedrol, folic acid  Pt reports good appetite prior to admission and during admission   Height:  Ht Readings from Last 1 Encounters:  07/06/14 4\' 11"  (1.499 m)    Weight: stable weight prior to admission  Wt Readings from Last 1 Encounters:  07/06/14 200 lb (90.719 kg)      Wt Readings from Last 10 Encounters:  07/06/14 200 lb (90.719 kg)    BMI:  Body mass index is 40.37 kg/(m^2).     Skin:  Reviewed, no issues  Diet Order:  Diet regular Room service appropriate?: Yes; Fluid consistency:: Thin  EDUCATION NEEDS:  No education needs identified at this time   Intake/Output Summary (Last 24 hours) at 07/07/14 1348 Last data filed at 07/07/14 0900  Gross per 24 hour  Intake    905 ml  Output   1850 ml  Net   -945 ml      LOW Care Level Dyanna Seiter B. Zenia Resides, [place], [place] (pager)

## 2014-07-07 NOTE — Progress Notes (Signed)
[facility]                                                                                                                                                                                            Patient Demographics   [name], is a 63 y.o. female, DOB - 17-Sep-1950, [id number]  Admit date - 07/06/2014   Admitting Physician Lytle Butte, MD  Outpatient Primary MD for the patient is No PCP Per Patient  LOS - 1  Chief Complaint  Patient presents with  . Bleeding/Bruising      Patient with history of renal transplant done in [place], who is followed by local nephrology here state that she started having bleeding and bruising. She recently moved here from [place] 2 months ago and during the transition she did not have her anti transplant medications, for 1-2 weeks. Patient came to the ER with the complaint of bleeding and bruising on the nephrologist on-call told the ED physician that she should be admitted and started on IV steroids. Other than the bleeding and bruising she has no other complaints  Review of Systems:   CONSTITUTIONAL: No documented fever. No fatigue, weakness. No weight gain, no weight loss.  EYES: No blurry or double vision.  ENT: No tinnitus. No postnasal drip. No redness of the oropharynx.  RESPIRATORY: No cough, no wheeze, no hemoptysis. No dyspnea.  CARDIOVASCULAR: No chest pain. No orthopnea. No palpitations. No syncope.  GASTROINTESTINAL: No nausea, no vomiting or diarrhea. No abdominal pain. No melena or hematochezia.  GENITOURINARY: No dysuria or hematuria.  ENDOCRINE: No polyuria or nocturia. No heat or cold intolerance.  HEMATOLOGY: No anemia. Positive bruising. Positive bleeding.  INTEGUMENTARY: No rashes. No lesions.  MUSCULOSKELETAL: No arthritis. No swelling. No gout.  NEUROLOGIC: No numbness, tingling, or ataxia. No seizure-type activity.  PSYCHIATRIC: No anxiety. No insomnia. No ADD.     Vitals:   Filed Vitals:   07/06/14 2300 07/06/14 2330 07/06/14 2345 07/07/14 0826  BP: 183/74 183/74 177/67 132/60  Pulse: 62  68 43  Temp:   98 F (36.7 C) 97.5 F (36.4 C)  TempSrc:   Oral Oral  Resp:   24   Height:      Weight:      SpO2: 97%  100% 98%    Wt Readings from Last 3 Encounters:  07/06/14 90.719 kg (200 lb)     Intake/Output Summary (Last 24 hours) at 07/07/14 1211 Last data filed at 07/07/14 0900  Gross per 24 hour  Intake    905 ml  Output   1850 ml  Net   -945 ml    Physical Exam:   GENERAL: Pleasant-appearing in no apparent distress.  HEAD, EYES, EARS, NOSE AND THROAT: Atraumatic, normocephalic. Extraocular muscles are intact. Pupils equal and reactive to light. Sclerae anicteric. No conjunctival injection. No oro-pharyngeal erythema.  NECK: Supple. There is no jugular venous distention. No bruits, no lymphadenopathy, no thyromegaly.  HEART: Regular rate and rhythm, tachycardic. No murmurs, no rubs, no clicks.  LUNGS: Clear to auscultation bilaterally. No rales or rhonchi. No wheezes.  ABDOMEN: Soft, flat, nontender, nondistended. Has good bowel sounds. No hepatosplenomegaly appreciated.  EXTREMITIES: No evidence of any cyanosis, clubbing, or peripheral edema.  +2 pedal and radial pulses bilaterally.  NEUROLOGIC: The patient is alert, awake, and oriented x3 with no focal motor or sensory deficits appreciated bilaterally.  SKIN: Ecchymosis noted on the arms.  Psych: Not anxious, depressed LN: No inguinal LN enlargement    Antibiotics   Anti-infectives    None      Medications   Scheduled Meds: . calcitRIOL  0.25 mcg Oral Daily  . folic acid  1 mg Oral Daily  . methylPREDNISolone (SOLU-MEDROL) injection  60 mg Intravenous Daily  . metoprolol succinate  25 mg Oral Daily  . mycophenolate  180 mg Oral BID  . sirolimus  6 mg Oral Daily   Continuous Infusions: . sodium chloride 100 mL/hr at 07/07/14 1123   PRN Meds:.acetaminophen  **OR** acetaminophen, hydrALAZINE, morphine injection, ondansetron **OR** ondansetron (ZOFRAN) IV   Data Review:   Micro Results No results found for this or any previous visit (from the past 240 hour(s)).  Radiology Reports No results found.   CBC  Recent Labs Lab 07/06/14 2035 07/07/14 0457  WBC 4.4 4.9  HGB 9.6* 10.4*  HCT 30.1* 32.1*  PLT 152 164  MCV 79.2* 77.9*  MCH 25.3* 25.4*  MCHC 32.0 32.6  RDW 15.5* 15.6*  LYMPHSABS 1.2  --   MONOABS 0.7  --   EOSABS 0.1  --   BASOSABS 0.1  --     Chemistries   Recent Labs Lab 07/06/14 2035 07/07/14 0457  NA 142 137  K 3.9 3.8  CL 109 108  CO2 27 23  GLUCOSE 104* 192*  BUN 28* 27*  CREATININE 1.31* 1.15*  CALCIUM 8.8* 8.4*  AST 23  --   ALT 17  --   ALKPHOS 42  --   BILITOT 0.3  --    ------------------------------------------------------------------------------------------------------------------ estimated creatinine clearance is 49.2 mL/min (by C-G formula based on Cr of 1.15). ------------------------------------------------------------------------------------------------------------------ No results for input(s): HGBA1C in the last 72 hours. ------------------------------------------------------------------------------------------------------------------ No results for input(s): CHOL, HDL, LDLCALC, TRIG, CHOLHDL, LDLDIRECT in the last 72 hours. ------------------------------------------------------------------------------------------------------------------ No results for input(s): TSH, T4TOTAL, T3FREE, THYROIDAB in the last 72 hours.  Invalid input(s): FREET3 ------------------------------------------------------------------------------------------------------------------ No results for input(s): VITAMINB12, FOLATE, FERRITIN, TIBC, IRON, RETICCTPCT in the last 72 hours.  Coagulation profile  Recent Labs Lab 07/06/14 2035  INR 0.98    No results for input(s): DDIMER in the last 72 hours.  Cardiac  Enzymes No results for input(s): CKMB, TROPONINI, MYOGLOBIN in the last 168 hours.  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    Essential hypertension: Continue hydralazine when necessary, as well as metoprolol   H/O kidney transplant: As per nephrology      Code Status Orders  Start     Ordered   07/06/14 2201  Full code   Continuous     07/06/14 2201      Family Communication: patient  Disposition Plan: home   Procedures none   Consults  nephrology   DVT Prophylaxis  SCDs   Lab Results  Component Value Date   PLT 164 07/07/2014     Time Spent in minutes   72min   Dustin Flock M.D on 07/07/2014 at 12:11 PM  Between 7am to 6pm - Pager - [phone]  After 6pm go to [email or web address] - password [email or web address] [facility]   Office  [phone]

## 2014-07-07 NOTE — Progress Notes (Signed)
Subjective:  Patient is followed by our office for CKD post transplant She presented to ER because of few bumps on her arms and legs They are purplish lesions - not tender or itchy In ER was found to have cr higher than her baseline Admitted for evaluation Patient reports she ran out of sirolimus. Did not take for about a week Now she has her meds at home   Objective:  Vital signs in last 24 hours:  Temp:  [97.5 F (36.4 C)-98 F (36.7 C)] 97.5 F (36.4 C) (05/23 1554) Pulse Rate:  [43-76] 76 (05/23 1554) Resp:  [16-24] 24 (05/22 2345) BP: (132-183)/(60-80) 146/69 mmHg (05/23 1554) SpO2:  [97 %-100 %] 99 % (05/23 1554) Weight:  [90.719 kg (200 lb)] 90.719 kg (200 lb) (05/22 2023)  Weight change:  Filed Weights   07/06/14 2023  Weight: 90.719 kg (200 lb)    Intake/Output: I/O last 3 completed shifts: In: 0  Out: 1550 [Urine:1550]     Physical Exam: General: NAD, ambulatory  HEENT anicteric  Neck supple  Pulm/lungs Clear b/l  CVS/Heart Regular, no rub  Abdomen:  Soft, non tender  Extremities: No edema  Neurologic: Alert, otiented  Skin: Purplish lesions over arms and legs, not raised, no tender  Access: Left arm AVG       Basic Metabolic Panel:  Recent Labs Lab 07/06/14 2035 07/07/14 0457  NA 142 137  K 3.9 3.8  CL 109 108  CO2 27 23  GLUCOSE 104* 192*  BUN 28* 27*  CREATININE 1.31* 1.15*  CALCIUM 8.8* 8.4*     CBC:  Recent Labs Lab 07/06/14 2035 07/07/14 0457  WBC 4.4 4.9  NEUTROABS 2.3  --   HGB 9.6* 10.4*  HCT 30.1* 32.1*  MCV 79.2* 77.9*  PLT 152 164      Microbiology: Results for orders placed or performed during the hospital encounter of 07/06/14  C difficile quick scan w PCR reflex [facility])     Status: None   Collection Time: 07/07/14 12:46 PM  Result Value Ref Range Status   C Diff antigen NOT DETECTED  Final   C Diff toxin NOT DETECTED  Final   C Diff interpretation Negative for C. difficile  Final    Coagulation  Studies:  Recent Labs  07/06/14 2035  LABPROT 13.2  INR 0.98    Urinalysis: No results for input(s): COLORURINE, LABSPEC, PHURINE, GLUCOSEU, HGBUR, BILIRUBINUR, KETONESUR, PROTEINUR, UROBILINOGEN, NITRITE, LEUKOCYTESUR in the last 72 hours.  Invalid input(s): APPERANCEUR    Imaging: No results found.   Medications:   . sodium chloride 100 mL/hr at 07/07/14 1123   . calcitRIOL  0.25 mcg Oral Daily  . folic acid  1 mg Oral Daily  . methylPREDNISolone (SOLU-MEDROL) injection  60 mg Intravenous Daily  . metoprolol succinate  25 mg Oral Daily  . mycophenolate  180 mg Oral BID  . sirolimus  6 mg Oral Daily   acetaminophen **OR** acetaminophen, hydrALAZINE, morphine injection, ondansetron **OR** ondansetron (ZOFRAN) IV  Assessment/ Plan:  64 y.o. AA female with ESRD status post deceased donor renal transplant at [facility], [place], [employer] in 2011, hypertension, renal cell carcinoma status post left nephrectomy, cholecystectomy, secondary hyperparathyroidism, anemia  1. ARF 2. Renal Transplant status  Plan:  DDX include rejection, volume depletion S Cr has improved with iv solumedrol Patient has started taking her anti rejection meds Continue home dose of immunosuppression meds If S Cr remains good, may be able to be d/c tomorrow  LOS: 1 Seferino Oscar 5/23/20165:11 PM

## 2014-07-07 NOTE — Progress Notes (Signed)
   07/07/14 0900  Clinical Encounter Type  Visited With Patient  Visit Type Spiritual support  Referral From Nurse  Spiritual Encounters  Spiritual Needs Prayer  Stress Factors  Patient Stress Factors None identified  Family Stress Factors Exhausted  Advance Directives (For Healthcare)  Does patient have an advance directive? No  Would patient like information on creating an advanced directive? No - patient declined information  Chaplain Marcello Moores engaged patient and supported her with prayer and encouragement.

## 2014-07-08 LAB — BASIC METABOLIC PANEL
Anion gap: 6 (ref 5–15)
BUN: 33 mg/dL — AB (ref 6–20)
CO2: 26 mmol/L (ref 22–32)
Calcium: 8.3 mg/dL — ABNORMAL LOW (ref 8.9–10.3)
Chloride: 109 mmol/L (ref 101–111)
Creatinine, Ser: 1.17 mg/dL — ABNORMAL HIGH (ref 0.44–1.00)
GFR calc non Af Amer: 49 mL/min — ABNORMAL LOW (ref >60)
GFR, EST AFRICAN AMERICAN: 56 mL/min — AB (ref >60)
Glucose, Bld: 112 mg/dL — ABNORMAL HIGH (ref 65–99)
Potassium: 4 mmol/L (ref 3.5–5.1)
SODIUM: 141 mmol/L (ref 135–145)

## 2014-07-08 NOTE — Discharge Summary (Signed)
[name], 64 y.o., DOB 12-Oct-1950, MRN [id number]. Admission date: 07/06/2014 Discharge Date 07/08/2014 Primary MD No PCP Per Patient Admitting Physician Lytle Butte, MD  Admission Diagnosis  Acute renal transplant rejection [T86.11] Bruising, spontaneous [R23.3]  Discharge Diagnosis   Active Problems:   Renal transplant rejection   Essential hypertension   H/O kidney transplant   Past Medical History  Diagnosis Date  . Cancer   . Hypertension   . GERD (gastroesophageal reflux disease)     Past Surgical History  Procedure Laterality Date  . Kidney transplant    . Abdominal hysterectomy        Hospital Course See H&P, Labs, Consult and Test reports for all details in brief, patient was admitted for  Chief Complaint  Patient presents with  . Bleeding/Bruising   was seen in the ED with the complaint of having some bruising. There was some concern that she may have renal transplant rejection her creatinine was slightly abnormal. She was given IV fluids and with hydration her renal function did improve. Nephrology feels that this is not a renal transplant rejection. She'll be followed up this Friday for laboratory work and then she'll see Dr. Candiss Norse on Monday. Ration has no further symptoms and is stable for discharge.  Active Problems:   Renal transplant rejection   Essential hypertension   H/O kidney transplant    Consults  nephrology  Significant Tests:  See full reports for all details    No results found.     Today   Subjective:   [name] patient has no further symptoms and doing well seen by Dr. Candiss Norse of nephrology he has okayed her to be discharged.  Objective:   Blood pressure 154/65, pulse 62, temperature 97.6 F (36.4 C), temperature source Oral, resp. rate 16, height 4\' 11"  (1.499 m), weight 90.719 kg (200 lb), SpO2 100 %.  .  Intake/Output Summary (Last 24 hours) at 07/08/14 1618 Last data filed at 07/08/14 [id number]  Gross per 24 hour   Intake 3772.34 ml  Output    775 ml  Net 2997.34 ml    Exam VITAL SIGNS: Blood pressure 154/65, pulse 62, temperature 97.6 F (36.4 C), temperature source Oral, resp. rate 16, height 4\' 11"  (1.499 m), weight 90.719 kg (200 lb), SpO2 100 %.  GENERAL:  64 y.o.-year-old patient lying in the bed with no acute distress.  EYES: Pupils equal, round, reactive to light and accommodation. No scleral icterus. Extraocular muscles intact.  HEENT: Head atraumatic, normocephalic. Oropharynx and nasopharynx clear.  NECK:  Supple, no jugular venous distention. No thyroid enlargement, no tenderness.  LUNGS: Normal breath sounds bilaterally, no wheezing, rales,rhonchi or crepitation. No use of accessory muscles of respiration.  CARDIOVASCULAR: S1, S2 normal. No murmurs, rubs, or gallops.  ABDOMEN: Soft, nontender, nondistended. Bowel sounds present. No organomegaly or mass.  EXTREMITIES: No pedal edema, cyanosis, or clubbing.  NEUROLOGIC: Cranial nerves II through XII are intact. Muscle strength 5/5 in all extremities. Sensation intact. Gait not checked.  PSYCHIATRIC: The patient is alert and oriented x 3.  SKIN: No obvious rash, lesion, or ulcer.   Data Review   Cultures  CBC w Diff: Lab Results  Component Value Date   WBC 4.9 07/07/2014   WBC 9.0 02/23/2014   HGB 10.4* 07/07/2014   HGB 9.1* 02/23/2014   HCT 32.1* 07/07/2014   HCT 28.5* 02/23/2014   PLT 164 07/07/2014   PLT 115* 02/23/2014   LYMPHOPCT 28 07/06/2014   LYMPHOPCT 6.6 02/23/2014  MONOPCT 16 07/06/2014   MONOPCT 11.6 02/23/2014   EOSPCT 2 07/06/2014   EOSPCT 0.5 02/23/2014   BASOPCT 1 07/06/2014   BASOPCT 0.9 02/23/2014   CMP: Lab Results  Component Value Date   NA 141 07/08/2014   NA 142 02/25/2014   K 4.0 07/08/2014   K 3.3* 02/25/2014   CL 109 07/08/2014   CL 110* 02/25/2014   CO2 26 07/08/2014   CO2 24 02/25/2014   BUN 33* 07/08/2014   BUN 11 02/25/2014   CREATININE 1.17* 07/08/2014   CREATININE 1.22  02/25/2014   PROT 6.8 07/06/2014   PROT 7.0 02/21/2014   ALBUMIN 3.5 07/06/2014   ALBUMIN 2.1* 02/23/2014   BILITOT 0.3 07/06/2014   ALKPHOS 42 07/06/2014   ALKPHOS 63 02/21/2014   AST 23 07/06/2014   AST 21 02/21/2014   ALT 17 07/06/2014   ALT 14 02/21/2014  .  Micro Results Recent Results (from the past 240 hour(s))  C difficile quick scan w PCR reflex [facility])     Status: None   Collection Time: 07/07/14 12:46 PM  Result Value Ref Range Status   C Diff antigen NOT DETECTED  Final   C Diff toxin NOT DETECTED  Final   C Diff interpretation Negative for C. difficile  Final        Discharge Instructions      Follow-up Information    Follow up with Murlean Iba, MD. Go on 07/15/2014.   Specialty:  Internal Medicine   Why:  at 10:30am    Contact information:   [place]  [address] [phone]       Follow up with Murlean Iba, MD. Go on 07/11/2014.   Specialty:  Internal Medicine   Why:  Between 8:30am-4:00pm for lab work   Contact information:   [place] [address] [phone]       Discharge Medications     Medication List    TAKE these medications        calcitRIOL 0.25 MCG capsule  Commonly known as:  ROCALTROL  Take 0.25 mcg by mouth daily.     folic acid 1 MG tablet  Commonly known as:  FOLVITE  Take 1 mg by mouth daily.     meloxicam 15 MG tablet  Commonly known as:  MOBIC  Take 15 mg by mouth daily.     metoprolol succinate 25 MG 24 hr tablet  Commonly known as:  TOPROL-XL  Take 25 mg by mouth daily.     mycophenolate 180 MG EC tablet  Commonly known as:  MYFORTIC  Take 180 mg by mouth 2 (two) times daily.     predniSONE 5 MG tablet  Commonly known as:  DELTASONE  Take 5 mg by mouth daily with breakfast.     sirolimus 1 MG tablet  Commonly known as:  RAPAMUNE  Take 6 mg by mouth daily.         Total Time in preparing paper work, data evaluation and todays exam  - 35 minutes  Dustin Flock M.D on 07/08/2014 at [place] PM  [facility]   Office  [phone]

## 2014-07-08 NOTE — Progress Notes (Signed)
Pt discharged to home. IV sites removed. Pt discharge summary given. Ted hose given to patient as ordered.

## 2014-07-08 NOTE — Progress Notes (Signed)
  Subjective:  Patient is overall doing well. No complaints of SOB and leg swelling.  Ser Creatinine 1.17 today    Objective:  Vital signs in last 24 hours:  Temp:  [97.5 F (36.4 C)-97.6 F (36.4 C)] 97.6 F (36.4 C) (05/24 0922) Pulse Rate:  [62-76] 62 (05/24 0922) Resp:  [16] 16 (05/23 2339) BP: (146-154)/(46-69) 154/65 mmHg (05/24 0922) SpO2:  [99 %-100 %] 100 % (05/24 0922)  Weight change:  Filed Weights   07/06/14 2023  Weight: 90.719 kg (200 lb)    Intake/Output: I/O last 3 completed shifts: In: [id number] [P.O.:480; I.V.:4224] Out: 2375 [Urine:2375]     Physical Exam: General: NAD, ambulatory  HEENT anicteric  Neck supple  Pulm/lungs Clear b/l  CVS/Heart Regular, no rub  Abdomen:  Soft, non tender  Extremities: No edema  Neurologic: Alert, otiented  Skin: Purplish lesions over arms and legs, not raised, no tender-   ?bruises  Access: Left arm AVG       Basic Metabolic Panel:  Recent Labs Lab 07/06/14 2035 07/07/14 0457 07/08/14 0432  NA 142 137 141  K 3.9 3.8 4.0  CL 109 108 109  CO2 27 23 26   GLUCOSE 104* 192* 112*  BUN 28* 27* 33*  CREATININE 1.31* 1.15* 1.17*  CALCIUM 8.8* 8.4* 8.3*     CBC:  Recent Labs Lab 07/06/14 2035 07/07/14 0457  WBC 4.4 4.9  NEUTROABS 2.3  --   HGB 9.6* 10.4*  HCT 30.1* 32.1*  MCV 79.2* 77.9*  PLT 152 164      Microbiology: Results for orders placed or performed during the hospital encounter of 07/06/14  C difficile quick scan w PCR reflex [facility])     Status: None   Collection Time: 07/07/14 12:46 PM  Result Value Ref Range Status   C Diff antigen NOT DETECTED  Final   C Diff toxin NOT DETECTED  Final   C Diff interpretation Negative for C. difficile  Final    Coagulation Studies:  Recent Labs  07/06/14 2035  LABPROT 13.2  INR 0.98    Urinalysis: No results for input(s): COLORURINE, LABSPEC, PHURINE, GLUCOSEU, HGBUR, BILIRUBINUR, KETONESUR, PROTEINUR, UROBILINOGEN, NITRITE, LEUKOCYTESUR in  the last 72 hours.  Invalid input(s): APPERANCEUR    Imaging: No results found.   Medications:   . sodium chloride 100 mL/hr at 07/08/14 0711   . calcitRIOL  0.25 mcg Oral Daily  . folic acid  1 mg Oral Daily  . methylPREDNISolone (SOLU-MEDROL) injection  60 mg Intravenous Daily  . metoprolol succinate  25 mg Oral Daily  . mycophenolate  180 mg Oral BID  . sirolimus  6 mg Oral Daily   acetaminophen **OR** acetaminophen, hydrALAZINE, morphine injection, ondansetron **OR** ondansetron (ZOFRAN) IV  Assessment/ Plan:  64 y.o. AA female with ESRD status post deceased donor renal transplant at [facility], [place], [place] in 2011, hypertension, renal cell carcinoma status post left nephrectomy, cholecystectomy, secondary hyperparathyroidism, anemia  1. ARF 2. Renal Transplant status  Plan:   Continue home dose of immunosuppression meds Can be d/c today Follow up in office for labs later this week. Will schedule office visit for following week     LOS: 2 Yamilet Mcfayden 5/24/20169:58 AM

## 2014-07-08 NOTE — Discharge Instructions (Signed)
°  DIET:  Renal diet  DISCHARGE CONDITION:  Good  ACTIVITY:  Activity as tolerated  OXYGEN:  Home Oxygen: No.   Oxygen Delivery: room air  DISCHARGE LOCATION:  home   If you experience worsening of your admission symptoms, develop shortness of breath, life threatening emergency, suicidal or homicidal thoughts you must seek medical attention immediately by calling 911 or calling your MD immediately  if symptoms less severe.  You Must read complete instructions/literature along with all the possible adverse reactions/side effects for all the Medicines you take and that have been prescribed to you. Take any new Medicines after you have completely understood and accpet all the possible adverse reactions/side effects.   Please note  You were cared for by a hospitalist during your hospital stay. If you have any questions about your discharge medications or the care you received while you were in the hospital after you are discharged, you can call the unit and asked to speak with the hospitalist on call if the hospitalist that took care of you is not available. Once you are discharged, your primary care physician will handle any further medical issues. Please note that NO REFILLS for any discharge medications will be authorized once you are discharged, as it is imperative that you return to your primary care physician (or establish a relationship with a primary care physician if you do not have one) for your aftercare needs so that they can reassess your need for medications and monitor your lab values.

## 2014-07-19 ENCOUNTER — Emergency Department
Admission: EM | Admit: 2014-07-19 | Discharge: 2014-07-19 | Disposition: A | Discharge status: Home / Self Care | Payer: Medicare Other | Attending: Student | Admitting: Student

## 2014-07-19 ENCOUNTER — Emergency Department: Payer: Medicare Other

## 2014-07-19 DIAGNOSIS — Z7952 Long term (current) use of systemic steroids: Secondary | ICD-10-CM | POA: Insufficient documentation

## 2014-07-19 DIAGNOSIS — Z87891 Personal history of nicotine dependence: Secondary | ICD-10-CM | POA: Diagnosis not present

## 2014-07-19 DIAGNOSIS — Z79899 Other long term (current) drug therapy: Secondary | ICD-10-CM | POA: Diagnosis not present

## 2014-07-19 DIAGNOSIS — M79602 Pain in left arm: Secondary | ICD-10-CM | POA: Diagnosis not present

## 2014-07-19 DIAGNOSIS — I10 Essential (primary) hypertension: Secondary | ICD-10-CM | POA: Diagnosis not present

## 2014-07-19 HISTORY — DX: Anemia, unspecified: D64.9

## 2014-07-19 HISTORY — DX: Disorder of kidney and ureter, unspecified: N28.9

## 2014-07-19 LAB — CBC WITH DIFFERENTIAL/PLATELET
BASOS ABS: 0.1 10*3/uL (ref 0–0.1)
Basophils Relative: 1 %
Eosinophils Absolute: 0.1 10*3/uL (ref 0–0.7)
Eosinophils Relative: 2 %
HCT: 31.1 % — ABNORMAL LOW (ref 35.0–47.0)
Hemoglobin: 9.9 g/dL — ABNORMAL LOW (ref 12.0–16.0)
Lymphocytes Relative: 23 %
Lymphs Abs: 1.1 10*3/uL (ref 1.0–3.6)
MCH: 25 pg — ABNORMAL LOW (ref 26.0–34.0)
MCHC: 31.8 g/dL — ABNORMAL LOW (ref 32.0–36.0)
MCV: 78.7 fL — ABNORMAL LOW (ref 80.0–100.0)
Monocytes Absolute: 0.8 10*3/uL (ref 0.2–0.9)
Monocytes Relative: 17 %
NEUTROS ABS: 2.7 10*3/uL (ref 1.4–6.5)
NEUTROS PCT: 57 %
Platelets: 160 10*3/uL (ref 150–440)
RBC: 3.96 MIL/uL (ref 3.80–5.20)
RDW: 15.2 % — AB (ref 11.5–14.5)
WBC: 4.7 10*3/uL (ref 3.6–11.0)

## 2014-07-19 LAB — COMPREHENSIVE METABOLIC PANEL
ALBUMIN: 3.3 g/dL — AB (ref 3.5–5.0)
ALT: 14 U/L (ref 14–54)
ANION GAP: 6 (ref 5–15)
AST: 20 U/L (ref 15–41)
Alkaline Phosphatase: 41 U/L (ref 38–126)
BUN: 37 mg/dL — ABNORMAL HIGH (ref 6–20)
CALCIUM: 8.9 mg/dL (ref 8.9–10.3)
CO2: 28 mmol/L (ref 22–32)
CREATININE: 1.3 mg/dL — AB (ref 0.44–1.00)
Chloride: 106 mmol/L (ref 101–111)
GFR calc non Af Amer: 43 mL/min — ABNORMAL LOW (ref >60)
GFR, EST AFRICAN AMERICAN: 50 mL/min — AB (ref >60)
Glucose, Bld: 136 mg/dL — ABNORMAL HIGH (ref 65–99)
Potassium: 3.6 mmol/L (ref 3.5–5.1)
SODIUM: 140 mmol/L (ref 135–145)
Total Bilirubin: 0.3 mg/dL (ref 0.3–1.2)
Total Protein: 6.6 g/dL (ref 6.5–8.1)

## 2014-07-19 NOTE — Discharge Instructions (Signed)
See Dr. Candiss Norse as soon as possible for follow-up. Return to emergency department if you develop severe or worsening pain in your left arm, if the arm becomes numb, weak or cool to the touch, if it becomes red, swollen, is draining pus, fevers, chills, chest pain, difficulty breathing, swelling, or for any other concerns.

## 2014-07-19 NOTE — ED Provider Notes (Signed)
[facility] Emergency Department Provider Note  ____________________________________________  Time seen: Approximately 7:06 PM  I have reviewed the triage vital signs and the nursing notes.   HISTORY  Chief Complaint Arm Pain    HPI [name] is a 63 y.o. female with known history of renal cell carcinoma status post right-sided nephrectomy, status post renal transplant 5 years ago, HTN who presents for evaluation of atraumatic left arm pain. Patient reports that she has had constant mild pain in the left arm associated with her old AV fistula. She reports the fistula has not been accessed in 5 years, since her transplant. She reports the pain is dull, worse with movement. She's had no fevers or chills, no vomiting or diarrhea, no chest pain or difficulty breathing. Current severity is mild. Of note, she was admitted to the hospital and discharged on 07/08/2014 after having labs drawn which were concerning for acute renal transplant rejection however ultimately, she was thought not to be in rejection. She reports that she has been feeling well since discharge.   Past Medical History  Diagnosis Date  . Cancer   . Hypertension   . GERD (gastroesophageal reflux disease)   . Renal disorder   . Anemia     Patient Active Problem List   Diagnosis Date Noted  . 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 Essential hypertension 07/06/2014  . H/O kidney transplant 07/06/2014    Past Surgical History  Procedure Laterality Date  . Kidney transplant    . Abdominal hysterectomy    . Nephrectomy transplanted organ    . Av fistula placement      Current Outpatient Rx  Name  Route  Sig  Dispense  Refill  . calcitRIOL (ROCALTROL) 0.25 MCG capsule   Oral   Take 0.25 mcg by mouth daily.         . folic acid (FOLVITE) 1 MG tablet   Oral   Take 1 mg by mouth daily.         . meloxicam (MOBIC) 15 MG tablet   Oral   Take 15 mg by mouth daily.         .  metoprolol succinate (TOPROL-XL) 25 MG 24 hr tablet   Oral   Take 25 mg by mouth daily.         . mycophenolate (MYFORTIC) 180 MG EC tablet   Oral   Take 180 mg by mouth 2 (two) times daily.         . predniSONE (DELTASONE) 5 MG tablet   Oral   Take 5 mg by mouth daily with breakfast.         . sirolimus (RAPAMUNE) 1 MG tablet   Oral   Take 6 mg by mouth daily.           Allergies Lisinopril  No family history on file.  Social History History  Substance Use Topics  . Smoking status: Former Research scientist (life sciences)  . Smokeless tobacco: Never Used  . Alcohol Use: No    Review of Systems Constitutional: No fever/chills Eyes: No visual changes. ENT: No sore throat. Cardiovascular: Denies chest pain. Respiratory: Denies shortness of breath. Gastrointestinal: No abdominal pain.  No nausea, no vomiting.  No diarrhea.  No constipation. Genitourinary: Negative for dysuria. Musculoskeletal: Negative for back pain. Skin: Negative for rash. Neurological: Negative for headaches, focal weakness or numbness.  10-point ROS otherwise negative.  ____________________________________________   PHYSICAL EXAM:  VITAL SIGNS: ED Triage Vitals  Enc Vitals Group  BP 07/19/14 1718 134/64 mmHg     Pulse Rate 07/19/14 1718 74     Resp 07/19/14 1718 18     Temp 07/19/14 1718 98.4 F (36.9 C)     Temp Source 07/19/14 1718 Oral     SpO2 07/19/14 1718 99 %     Weight 07/19/14 1718 203 lb (92.08 kg)     Height 07/19/14 1718 4\' 11"  (1.499 m)     Head Cir --      Peak Flow --      Pain Score 07/19/14 1719 6     Pain Loc --      Pain Edu? --      Excl. in [place]? --     Constitutional: Alert and oriented. Well appearing and in no acute distress. Eyes: Conjunctivae are normal. PERRL. EOMI. Head: Atraumatic. Nose: No congestion/rhinnorhea. Mouth/Throat: Mucous membranes are moist.  Oropharynx non-erythematous. Neck: No stridor.   Cardiovascular: Normal rate, regular rhythm. Grossly normal  heart sounds.  Good peripheral circulation. Respiratory: Normal respiratory effort.  No retractions. Lungs CTAB. Gastrointestinal: Soft and nontender. No distention. No abdominal bruits. No CVA tenderness. Genitourinary: deferred Musculoskeletal: No lower extremity tenderness nor edema.  No joint effusions. Left arm with well-appearing AV fistula near the [facility] with palpable pulse and associated thrill, no erythema or drainage, no tenderness, induration or fluctuance. Neurologic:  Normal speech and language. No gross focal neurologic deficits are appreciated. Speech is normal. No gait instability. Skin:  Skin is warm, dry and intact. No rash noted. Psychiatric: Mood and affect are normal. Speech and behavior are normal.  ____________________________________________   LABS (all labs ordered are listed, but only abnormal results are displayed)  Labs Reviewed  CBC WITH DIFFERENTIAL/PLATELET  COMPREHENSIVE METABOLIC PANEL   ____________________________________________  EKG  none ____________________________________________  RADIOLOGY  Venous doppler ultrasound of left arm FINDINGS: Contralateral Subclavian Vein: Respiratory phasicity is normal and symmetric with the symptomatic side. No evidence of thrombus. Normal compressibility.  Internal Jugular Vein: No evidence of thrombus. Normal compressibility, respiratory phasicity and response to augmentation.  Subclavian Vein: No evidence of thrombus. Normal compressibility, respiratory phasicity and response to augmentation.  Axillary Vein: No evidence of thrombus. Normal compressibility, respiratory phasicity and response to augmentation.  Cephalic Vein: No evidence of thrombus. Normal compressibility, respiratory phasicity and response to augmentation.  Basilic Vein: No evidence of thrombus. Normal compressibility, respiratory phasicity and response to augmentation.  Brachial Veins: No evidence of thrombus. Normal  compressibility, respiratory phasicity and response to augmentation.  Radial Veins: No evidence of thrombus. Normal compressibility, respiratory phasicity and response to augmentation.  Ulnar Veins: No evidence of thrombus. Normal compressibility, respiratory phasicity and response to augmentation.  Venous Reflux: None visualized.  Other Findings: Probable fistula LEFT antecubital fossa at site of palpable concern is widely patent without thrombus.  IMPRESSION: No evidence of deep venous thrombosis in LEFT upper extremity. ____________________________________________   PROCEDURES  Procedure(s) performed: None  Critical Care performed: No  ____________________________________________   INITIAL IMPRESSION / ASSESSMENT AND PLAN / ED COURSE  Pertinent labs & imaging results that were available during my care of the patient were reviewed by me and considered in my medical decision making (see chart for details).  [name] is a 64 y.o. female with known history of renal cell carcinoma status post right-sided nephrectomy, status post renal transplant 5 years ago, HTN who presents for evaluation of atraumatic left arm pain. On exam, she is very well-appearing in no acute distress. Vital signs stable,  she is afebrile. Left arm with functioning AV fistula, no tenderness, no fluctuance, strong pulse is a with a fistula and she has a strong left radial pulse. Not consistent with cellulitis or abscess, not consistent with acute arterial occlusion. Known is accessed her fistula in over 5 years. She appears quite well. Will plan for basic screening labs and anticipate discharge.  ----------------------------------------- 9:31 PM on 07/19/2014 ----------------------------------------- CBC shows stable anemia. Creatinine 1.3, discussed with Dr. Candiss Norse and it was 1.21 on 07/11/2014 so this is near her baseline. He agrees with plan to discharge home and she will follow-up with him in clinic.  Return precautions discussed with the patient she is comfortable with discharge.  ____________________________________________   FINAL CLINICAL IMPRESSION(S) / ED DIAGNOSES  Final diagnoses:  Arm pain, left      Joanne Gavel, MD 07/19/14 2131

## 2014-07-19 NOTE — ED Notes (Signed)
Pt resting in bed, daughter at bedside, no distress noted.

## 2014-07-19 NOTE — ED Notes (Signed)
Pt c/o increasing pain to the dialysis fistula site in the left upper arm over the past 2 days.the patient is a kidney transplant

## 2014-09-25 ENCOUNTER — Encounter: Payer: Self-pay | Admitting: Emergency Medicine

## 2014-09-25 ENCOUNTER — Emergency Department
Admission: EM | Admit: 2014-09-25 | Discharge: 2014-09-25 | Disposition: A | Discharge status: Home / Self Care | Payer: Medicare Other | Attending: Student | Admitting: Student

## 2014-09-25 DIAGNOSIS — Z87891 Personal history of nicotine dependence: Secondary | ICD-10-CM | POA: Diagnosis not present

## 2014-09-25 DIAGNOSIS — Y9389 Activity, other specified: Secondary | ICD-10-CM | POA: Diagnosis not present

## 2014-09-25 DIAGNOSIS — I10 Essential (primary) hypertension: Secondary | ICD-10-CM | POA: Diagnosis not present

## 2014-09-25 DIAGNOSIS — X58XXXA Exposure to other specified factors, initial encounter: Secondary | ICD-10-CM | POA: Insufficient documentation

## 2014-09-25 DIAGNOSIS — M79602 Pain in left arm: Secondary | ICD-10-CM

## 2014-09-25 DIAGNOSIS — Z791 Long term (current) use of non-steroidal anti-inflammatories (NSAID): Secondary | ICD-10-CM | POA: Diagnosis not present

## 2014-09-25 DIAGNOSIS — Y9289 Other specified places as the place of occurrence of the external cause: Secondary | ICD-10-CM | POA: Diagnosis not present

## 2014-09-25 DIAGNOSIS — M62838 Other muscle spasm: Secondary | ICD-10-CM | POA: Diagnosis not present

## 2014-09-25 DIAGNOSIS — S4992XA Unspecified injury of left shoulder and upper arm, initial encounter: Secondary | ICD-10-CM | POA: Diagnosis not present

## 2014-09-25 DIAGNOSIS — Z79899 Other long term (current) drug therapy: Secondary | ICD-10-CM | POA: Diagnosis not present

## 2014-09-25 DIAGNOSIS — Y998 Other external cause status: Secondary | ICD-10-CM | POA: Diagnosis not present

## 2014-09-25 LAB — COMPREHENSIVE METABOLIC PANEL
ALBUMIN: 3.4 g/dL — AB (ref 3.5–5.0)
ALK PHOS: 48 U/L (ref 38–126)
ALT: 14 U/L (ref 14–54)
AST: 24 U/L (ref 15–41)
Anion gap: 9 (ref 5–15)
BUN: 32 mg/dL — AB (ref 6–20)
CO2: 26 mmol/L (ref 22–32)
Calcium: 9 mg/dL (ref 8.9–10.3)
Chloride: 104 mmol/L (ref 101–111)
Creatinine, Ser: 1.28 mg/dL — ABNORMAL HIGH (ref 0.44–1.00)
GFR calc Af Amer: 50 mL/min — ABNORMAL LOW (ref >60)
GFR calc non Af Amer: 44 mL/min — ABNORMAL LOW (ref >60)
Glucose, Bld: 140 mg/dL — ABNORMAL HIGH (ref 65–99)
POTASSIUM: 3.2 mmol/L — AB (ref 3.5–5.1)
SODIUM: 139 mmol/L (ref 135–145)
TOTAL PROTEIN: 7.2 g/dL (ref 6.5–8.1)
Total Bilirubin: <0.1 mg/dL — ABNORMAL LOW (ref 0.3–1.2)

## 2014-09-25 LAB — CK: Total CK: 154 U/L (ref 38–234)

## 2014-09-25 LAB — CBC WITH DIFFERENTIAL/PLATELET
Basophils Absolute: 0.1 10*3/uL (ref 0–0.1)
Basophils Relative: 1 %
Eosinophils Absolute: 0.1 10*3/uL (ref 0–0.7)
Eosinophils Relative: 3 %
HCT: 33.3 % — ABNORMAL LOW (ref 35.0–47.0)
Hemoglobin: 10.7 g/dL — ABNORMAL LOW (ref 12.0–16.0)
LYMPHS PCT: 27 %
Lymphs Abs: 1.1 10*3/uL (ref 1.0–3.6)
MCH: 25.2 pg — ABNORMAL LOW (ref 26.0–34.0)
MCHC: 32.1 g/dL (ref 32.0–36.0)
MCV: 78.4 fL — ABNORMAL LOW (ref 80.0–100.0)
Monocytes Absolute: 0.6 10*3/uL (ref 0.2–0.9)
Monocytes Relative: 15 %
NEUTROS ABS: 2.2 10*3/uL (ref 1.4–6.5)
Neutrophils Relative %: 54 %
PLATELETS: 155 10*3/uL (ref 150–440)
RBC: 4.25 MIL/uL (ref 3.80–5.20)
RDW: 15 % — AB (ref 11.5–14.5)
WBC: 4 10*3/uL (ref 3.6–11.0)

## 2014-09-25 NOTE — ED Notes (Signed)
States she has a fistula in left arm  And is having pain to same arm today described as muscle cramping type pain . Pt is not on dialysis due to transplant

## 2014-09-25 NOTE — ED Provider Notes (Signed)
[facility] Emergency Department Provider Note  ____________________________________________  Time seen: Approximately 6:56 PM  I have reviewed the triage vital signs and the nursing notes.   HISTORY  Chief Complaint Arm Pain    HPI [name] is a 64 y.o. female with known history of renal cell carcinoma status post right-sided nephrectomy, status post renal transplant 5 years ago, HTN who presents for evaluation of resolved atraumatic left arm pain, sudden onset today. 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 She reports she suddenly developed pain in the left arm near her old fistula. Her hands cramped up and contracted briefly. This resolved spontaneously. Currently she has no pain.  She reports the fistula has not been accessed in 5 years, since her transplant. No modifying factors. She's had no fevers or chills, no vomiting or diarrhea, no chest pain or difficulty breathing.   Past Medical History  Diagnosis Date  . Cancer   . Hypertension   . GERD (gastroesophageal reflux disease)   . Renal disorder   . Anemia     Patient Active Problem List   Diagnosis Date Noted  . 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 Essential hypertension 07/06/2014  . H/O kidney transplant 07/06/2014    Past Surgical History  Procedure Laterality Date  . Kidney transplant    . Abdominal hysterectomy    . Nephrectomy transplanted organ    . Av fistula placement      Current Outpatient Rx  Name  Route  Sig  Dispense  Refill  . calcitRIOL (ROCALTROL) 0.25 MCG capsule   Oral   Take 0.25 mcg by mouth daily.         . citalopram (CELEXA) 20 MG tablet   Oral   Take 20 mg by mouth daily.         . folic acid (FOLVITE) 1 MG tablet   Oral   Take 1 mg by mouth daily.         . furosemide (LASIX) 20 MG tablet   Oral   Take 20 mg by mouth daily.         Marland Kitchen losartan (COZAAR) 25 MG tablet   Oral   Take 25 mg by mouth  daily.         . meloxicam (MOBIC) 15 MG tablet   Oral   Take 15 mg by mouth daily.         . metoprolol succinate (TOPROL-XL) 25 MG 24 hr tablet   Oral   Take 25 mg by mouth daily.         . mycophenolate (MYFORTIC) 180 MG EC tablet   Oral   Take 180 mg by mouth 2 (two) times daily.         Marland Kitchen POTASSIUM PO   Oral   Take 1 tablet by mouth 2 (two) times daily.         . pravastatin (PRAVACHOL) 20 MG tablet   Oral   Take 20 mg by mouth at bedtime.         . predniSONE (DELTASONE) 5 MG tablet   Oral   Take 5 mg by mouth daily.          . sirolimus (RAPAMUNE) 1 MG tablet   Oral   Take 6 mg by mouth daily.           Allergies Lisinopril  No family history on file.  Social History Social History  Substance Use Topics  . Smoking  status: Former Research scientist (life sciences)  . Smokeless tobacco: Never Used  . Alcohol Use: No    Review of Systems Constitutional: No fever/chills Eyes: No visual changes. ENT: No sore throat. Cardiovascular: Denies chest pain. Respiratory: Denies shortness of breath. Gastrointestinal: No abdominal pain.  No nausea, no vomiting.  No diarrhea.  No constipation. Genitourinary: Negative for dysuria. Musculoskeletal: Negative for back pain. Skin: Negative for rash. Neurological: Negative for headaches, focal weakness or numbness.  10-point ROS otherwise negative.  ____________________________________________   PHYSICAL EXAM:  VITAL SIGNS: ED Triage Vitals  Enc Vitals Group     BP 09/25/14 1813 144/61 mmHg     Pulse Rate 09/25/14 1813 71     Resp 09/25/14 1813 18     Temp 09/25/14 1813 98.1 F (36.7 C)     Temp Source 09/25/14 1813 Oral     SpO2 09/25/14 1813 100 %     Weight 09/25/14 1813 217 lb (98.431 kg)     Height 09/25/14 1813 4\' 11"  (1.499 m)     Head Cir --      Peak Flow --      Pain Score 09/25/14 1813 3     Pain Loc --      Pain Edu? --      Excl. in [place]? --     Constitutional: Alert and oriented. Well appearing and  in no acute distress. Eyes: Conjunctivae are normal. PERRL. EOMI. Head: Atraumatic. Nose: No congestion/rhinnorhea. Mouth/Throat: Mucous membranes are moist.  Oropharynx non-erythematous. Neck: No stridor. Cardiovascular: Normal rate, regular rhythm. Grossly normal heart sounds.  Good peripheral circulation. Respiratory: Normal respiratory effort.  No retractions. Lungs CTAB. Gastrointestinal: Soft and nontender. No distention. No abdominal bruits. No CVA tenderness. Genitourinary: deferred Musculoskeletal: No lower extremity tenderness nor edema.  No joint effusions. Left arm with well-appearing AV fistula near the [facility] with palpable pulse and associated thrill, no erythema or drainage, no tenderness, induration or fluctuance.2+ left radial pulse Neurologic:  Normal speech and language. No gross focal neurologic deficits are appreciated. No gait instability. Skin:  Skin is warm, dry and intact. No rash noted. Psychiatric: Mood and affect are normal. Speech and behavior are normal.  ____________________________________________   LABS (all labs ordered are listed, but only abnormal results are displayed)  Labs Reviewed  CBC WITH DIFFERENTIAL/PLATELET - Abnormal; Notable for the following:    Hemoglobin 10.7 (*)    HCT 33.3 (*)    MCV 78.4 (*)    MCH 25.2 (*)    RDW 15.0 (*)    All other components within normal limits  COMPREHENSIVE METABOLIC PANEL - Abnormal; Notable for the following:    Potassium 3.2 (*)    Glucose, Bld 140 (*)    BUN 32 (*)    Creatinine, Ser 1.28 (*)    Albumin 3.4 (*)    Total Bilirubin <0.1 (*)    GFR calc non Af Amer 44 (*)    GFR calc Af Amer 50 (*)    All other components within normal limits  CK   ____________________________________________  EKG  none ____________________________________________  RADIOLOGY  none ____________________________________________   PROCEDURES  Procedure(s) performed: None  Critical Care performed:  No  ____________________________________________   INITIAL IMPRESSION / ASSESSMENT AND PLAN / ED COURSE  Pertinent labs & imaging results that were available during my care of the patient were reviewed by me and considered in my medical decision making (see chart for details).  [name] is a 64 y.o. female with known history  of renal cell carcinoma status post right-sided nephrectomy, status post renal transplant 5 years ago, HTN who presents for evaluation of resolved atraumatic left arm pain. On exam, she is very well-appearing and in no acute distress. Vital signs stable, she is afebrile. She has a very well-appearing fistula in the left arm with palpable thrill, no tenderness,  No erythema, no swelling or fluctuance - not consistent with cellulitis or abscess. She has no asymmetry of the upper extremities, no swelling and I saw her for similar pain in June of this year during which time ultrasound was negative for acute DVT. Suspect muscle spasm as the cause of her pain. Spasms have resolved at this time. Labs reviewed and are notable for chronic stable anemia. Creatinine at baseline. CK not elevated. Discussed return precautions, close PCP follow-up and she is comfortable with the discharge plan. ____________________________________________   FINAL CLINICAL IMPRESSION(S) / ED DIAGNOSES  Final diagnoses:  Pain of left arm  Muscle spasm      Joanne Gavel, MD 09/25/14 2138

## 2014-09-25 NOTE — Discharge Instructions (Signed)
Return immediately to the emergency department for recurrence of left arm pain, if the arm becomes swollen, warm or red, blue or cold, numb or weak, swollen or tender, or for any other concerns. Otherwise, see your primary care doctor as soon as possible regarding today's visit.

## 2014-12-03 ENCOUNTER — Emergency Department: Payer: Medicare Other

## 2014-12-03 ENCOUNTER — Encounter: Payer: Self-pay | Admitting: *Deleted

## 2014-12-03 ENCOUNTER — Emergency Department
Admission: EM | Admit: 2014-12-03 | Discharge: 2014-12-03 | Disposition: A | Discharge status: Home / Self Care | Payer: Medicare Other | Attending: Emergency Medicine | Admitting: Emergency Medicine

## 2014-12-03 DIAGNOSIS — Y998 Other external cause status: Secondary | ICD-10-CM | POA: Insufficient documentation

## 2014-12-03 DIAGNOSIS — S8991XA Unspecified injury of right lower leg, initial encounter: Secondary | ICD-10-CM | POA: Diagnosis present

## 2014-12-03 DIAGNOSIS — Z87891 Personal history of nicotine dependence: Secondary | ICD-10-CM | POA: Diagnosis not present

## 2014-12-03 DIAGNOSIS — Y9389 Activity, other specified: Secondary | ICD-10-CM | POA: Diagnosis not present

## 2014-12-03 DIAGNOSIS — Z791 Long term (current) use of non-steroidal anti-inflammatories (NSAID): Secondary | ICD-10-CM | POA: Insufficient documentation

## 2014-12-03 DIAGNOSIS — I10 Essential (primary) hypertension: Secondary | ICD-10-CM | POA: Insufficient documentation

## 2014-12-03 DIAGNOSIS — Y9289 Other specified places as the place of occurrence of the external cause: Secondary | ICD-10-CM | POA: Insufficient documentation

## 2014-12-03 DIAGNOSIS — Z79899 Other long term (current) drug therapy: Secondary | ICD-10-CM | POA: Diagnosis not present

## 2014-12-03 DIAGNOSIS — S40011A Contusion of right shoulder, initial encounter: Secondary | ICD-10-CM | POA: Diagnosis not present

## 2014-12-03 DIAGNOSIS — W19XXXA Unspecified fall, initial encounter: Secondary | ICD-10-CM | POA: Diagnosis not present

## 2014-12-03 DIAGNOSIS — S8002XA Contusion of left knee, initial encounter: Secondary | ICD-10-CM | POA: Diagnosis not present

## 2014-12-03 DIAGNOSIS — S8001XA Contusion of right knee, initial encounter: Secondary | ICD-10-CM | POA: Diagnosis not present

## 2014-12-03 MED ORDER — HYDROCODONE-ACETAMINOPHEN 5-325 MG PO TABS: 1.0000 | ORAL_TABLET | ORAL | Status: DC | PRN

## 2014-12-03 NOTE — ED Provider Notes (Signed)
[facility] Emergency Department Provider Note  ____________________________________________  Time seen: Approximately 12:46 PM  I have reviewed the triage vital signs and the nursing notes.   HISTORY  Chief Complaint Fall  HPI [name] is a 64 y.o. female is here with complaint of bilateral knee and right shoulder pain after a mechanical fall 2 weeks ago. Patient states that she fell to her knees and then over to the right. She denies any loss of consciousness with this injury. She has taken some Tylenol infrequently for her knee pain and shoulder pain. She states that range of motion with her shoulder is restricted that she has continued to walk. She has had a history of previous knee pain and recently got a cortisone injection in her left knee. She states her right knee is worse with pain in her left. Currently she rates her pain a 3 out of 10. Pain is worse with walking and decreased with lying down or sitting.   Past Medical History  Diagnosis Date  . Cancer ([place])   . Hypertension   . GERD (gastroesophageal reflux disease)   . Renal disorder   . Anemia     Patient Active Problem List   Diagnosis Date Noted  . 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 Essential hypertension 07/06/2014  . H/O kidney transplant 07/06/2014    Past Surgical History  Procedure Laterality Date  . Kidney transplant    . Abdominal hysterectomy    . Nephrectomy transplanted organ    . Av fistula placement      Current Outpatient Rx  Name  Route  Sig  Dispense  Refill  . calcitRIOL (ROCALTROL) 0.25 MCG capsule   Oral   Take 0.25 mcg by mouth daily.         . citalopram (CELEXA) 20 MG tablet   Oral   Take 20 mg by mouth daily.         . folic acid (FOLVITE) 1 MG tablet   Oral   Take 1 mg by mouth daily.         . furosemide (LASIX) 20 MG tablet   Oral   Take 20 mg by mouth daily.         Marland Kitchen HYDROcodone-acetaminophen (NORCO/VICODIN) 5-325 MG  tablet   Oral   Take 1 tablet by mouth every 4 (four) hours as needed for moderate pain.   20 tablet   0   . losartan (COZAAR) 25 MG tablet   Oral   Take 25 mg by mouth daily.         . meloxicam (MOBIC) 15 MG tablet   Oral   Take 15 mg by mouth daily.         . metoprolol succinate (TOPROL-XL) 25 MG 24 hr tablet   Oral   Take 25 mg by mouth daily.         . mycophenolate (MYFORTIC) 180 MG EC tablet   Oral   Take 180 mg by mouth 2 (two) times daily.         Marland Kitchen POTASSIUM PO   Oral   Take 1 tablet by mouth 2 (two) times daily.         . pravastatin (PRAVACHOL) 20 MG tablet   Oral   Take 20 mg by mouth at bedtime.         . predniSONE (DELTASONE) 5 MG tablet   Oral   Take 5 mg by mouth daily.          Marland Kitchen  sirolimus (RAPAMUNE) 1 MG tablet   Oral   Take 6 mg by mouth daily.           Allergies Lisinopril  No family history on file.  Social History Social History  Substance Use Topics  . Smoking status: Former Research scientist (life sciences)  . Smokeless tobacco: Never Used  . Alcohol Use: No    Review of Systems Constitutional: No fever/chills Eyes: No visual changes. Cardiovascular: Denies chest pain. Respiratory: Denies shortness of breath. Gastrointestinal: No abdominal pain.  No nausea, no vomiting. Musculoskeletal: Negative for back pain. Positive right shoulder pain and bilateral knee pain. Skin: Negative for rash. Neurological: Negative for headaches, focal weakness or numbness.  10-point ROS otherwise negative.  ____________________________________________   PHYSICAL EXAM:  VITAL SIGNS: ED Triage Vitals  Enc Vitals Group     BP 12/03/14 1211 138/57 mmHg     Pulse Rate 12/03/14 1211 69     Resp 12/03/14 1211 16     Temp 12/03/14 1211 98.3 F (36.8 C)     Temp src --      SpO2 12/03/14 1211 97 %     Weight 12/03/14 1211 217 lb (98.431 kg)     Height 12/03/14 1211 4\' 11"  (1.499 m)     Head Cir --      Peak Flow --      Pain Score 12/03/14 1210 3      Pain Loc --      Pain Edu? --      Excl. in [place]? --     Constitutional: Alert and oriented. Well appearing and in no acute distress. Eyes: Conjunctivae are normal. PERRL. EOMI. Head: Atraumatic. Nose: No congestion/rhinnorhea. Neck: No stridor.   Cardiovascular: Normal rate, regular rhythm. Grossly normal heart sounds.  Good peripheral circulation. Respiratory: Normal respiratory effort.  No retractions. Lungs CTAB. Gastrointestinal: Soft and nontender. No distention.  Musculoskeletal: Moderate tenderness on palpation of the right shoulder with restricted range of motion in abduction. No gross deformity was noted. Right knee with moderate tenderness on palpation anteriorly, no effusion was noted. Left knee no effusion and minimal tenderness on palpation. Range of motion bilateral knees with some crepitus and range of motion is slightly. Neurologic:  Normal speech and language. No gross focal neurologic deficits are appreciated. No gait instability. Skin:  Skin is warm, dry and intact. No rash noted. No abrasions or ecchymosis was noted Psychiatric: Mood and affect are normal. Speech and behavior are normal.  ____________________________________________   LABS (all labs ordered are listed, but only abnormal results are displayed)  Labs Reviewed - No data to display  RADIOLOGY X-ray right knee is negative for acute fracture or dislocation. There is no effusion seen per radiologist. Osteoarthritis is present. Right shoulder no acute injury per radiologist. Leana Gamer, personally viewed and evaluated these images (plain radiographs) as part of my medical decision making.  ____________________________________________   PROCEDURES  Procedure(s) performed: None  Critical Care performed: No  ____________________________________________   INITIAL IMPRESSION / ASSESSMENT AND PLAN / ED COURSE  Pertinent labs & imaging results that were available during my care of the  patient were reviewed by me and considered in my medical decision making (see chart for details)  Patient was made aware that her x-rays did not show any fracture but does show arthritis. Patient cannot take NSAIDs due to renal disease. Patient was given a prescription for Norco as needed for pain. She is to follow-up with her PCP or follow-up with Dr.  Sabra Heck if any continued problems with her knee..   ____________________________________________   FINAL CLINICAL IMPRESSION(S) / ED DIAGNOSES  Final diagnoses:  Contusion, knee, right, initial encounter  Contusion of left knee, initial encounter  Shoulder contusion, right, initial encounter      Johnn Hai, PA-C 12/03/14 1521  Lisa Roca, MD 12/03/14 1610

## 2014-12-03 NOTE — ED Notes (Signed)
Pt reports fall two weeks ago, continues to have right shoulder pain and bilateral knee pain.

## 2014-12-03 NOTE — Discharge Instructions (Signed)
Contusion °A contusion is a deep bruise. Contusions happen when an injury causes bleeding under the skin. Symptoms of bruising include pain, swelling, and discolored skin. The skin may turn blue, purple, or yellow. °HOME CARE  °· Rest the injured area. °· If told, put ice on the injured area. °· Put ice in a plastic bag. °· Place a towel between your skin and the bag. °· Leave the ice on for 20 minutes, 2-3 times per day. °· If told, put light pressure (compression) on the injured area using an elastic bandage. Make sure the bandage is not too tight. Remove it and put it back on as told by your doctor. °· If possible, raise (elevate) the injured area above the level of your heart while you are sitting or lying down. °· Take over-the-counter and prescription medicines only as told by your doctor. °GET HELP IF: °· Your symptoms do not get better after several days of treatment. °· Your symptoms get worse. °· You have trouble moving the injured area. °GET HELP RIGHT AWAY IF:  °· You have very bad pain. °· You have a loss of feeling (numbness) in a hand or foot. °· Your hand or foot turns pale or cold. °  °This information is not intended to replace advice given to you by your health care provider. Make sure you discuss any questions you have with your health care provider. °  °Document Released: 07/20/2007 Document Revised: 10/22/2014 Document Reviewed: 06/18/2014 °Elsevier Interactive Patient Education ©2016 Elsevier Inc[employer] ° °Cryotherapy °Cryotherapy is when you put ice on your injury. Ice helps lessen pain and puffiness (swelling) after an injury. Ice works the best when you start using it in the first 24 to 48 hours after an injury. °HOME CARE °· Put a dry or damp towel between the ice pack and your skin. °· You may press gently on the ice pack. °· Leave the ice on for no more than 10 to 20 minutes at a time. °· Check your skin after 5 minutes to make sure your skin is okay. °· Rest at least 20 minutes between ice  pack uses. °· Stop using ice when your skin loses feeling (numbness). °· Do not use ice on someone who cannot tell you when it hurts. This includes small children and people with memory problems (dementia). °GET HELP RIGHT AWAY IF: °· You have white spots on your skin. °· Your skin turns blue or pale. °· Your skin feels waxy or hard. °· Your puffiness gets worse. °MAKE SURE YOU:  °· Understand these instructions. °· Will watch your condition. °· Will get help right away if you are not doing well or get worse. °  °This information is not intended to replace advice given to you by your health care provider. Make sure you discuss any questions you have with your health care provider. °  °Document Released: 07/20/2007 Document Revised: 04/25/2011 Document Reviewed: 09/23/2010 °Elsevier Interactive Patient Education ©2016 Elsevier Inc. ° °

## 2015-02-01 ENCOUNTER — Emergency Department: Payer: Medicare Other

## 2015-02-01 ENCOUNTER — Encounter: Payer: Self-pay | Admitting: Emergency Medicine

## 2015-02-01 ENCOUNTER — Emergency Department
Admission: EM | Admit: 2015-02-01 | Discharge: 2015-02-01 | Disposition: A | Discharge status: Home / Self Care | Payer: Medicare Other | Attending: Emergency Medicine | Admitting: Emergency Medicine

## 2015-02-01 DIAGNOSIS — Z87891 Personal history of nicotine dependence: Secondary | ICD-10-CM | POA: Diagnosis not present

## 2015-02-01 DIAGNOSIS — I10 Essential (primary) hypertension: Secondary | ICD-10-CM | POA: Diagnosis not present

## 2015-02-01 DIAGNOSIS — Y9289 Other specified places as the place of occurrence of the external cause: Secondary | ICD-10-CM | POA: Insufficient documentation

## 2015-02-01 DIAGNOSIS — Y998 Other external cause status: Secondary | ICD-10-CM | POA: Insufficient documentation

## 2015-02-01 DIAGNOSIS — Y9389 Activity, other specified: Secondary | ICD-10-CM | POA: Insufficient documentation

## 2015-02-01 DIAGNOSIS — S20211A Contusion of right front wall of thorax, initial encounter: Secondary | ICD-10-CM | POA: Diagnosis not present

## 2015-02-01 DIAGNOSIS — W01198A Fall on same level from slipping, tripping and stumbling with subsequent striking against other object, initial encounter: Secondary | ICD-10-CM | POA: Insufficient documentation

## 2015-02-01 DIAGNOSIS — Z79899 Other long term (current) drug therapy: Secondary | ICD-10-CM | POA: Diagnosis not present

## 2015-02-01 DIAGNOSIS — Z791 Long term (current) use of non-steroidal anti-inflammatories (NSAID): Secondary | ICD-10-CM | POA: Diagnosis not present

## 2015-02-01 DIAGNOSIS — Z7952 Long term (current) use of systemic steroids: Secondary | ICD-10-CM | POA: Diagnosis not present

## 2015-02-01 DIAGNOSIS — S29001A Unspecified injury of muscle and tendon of front wall of thorax, initial encounter: Secondary | ICD-10-CM | POA: Diagnosis present

## 2015-02-01 MED ORDER — HYDROCODONE-ACETAMINOPHEN 5-325 MG PO TABS: 1.0000 | ORAL_TABLET | Freq: Four times a day (QID) | ORAL | Status: DC | PRN

## 2015-02-01 NOTE — ED Provider Notes (Signed)
[facility] Emergency Department Provider Note  ____________________________________________  Time seen: Approximately 10:44 AM  I have reviewed the triage vital signs and the nursing notes.   HISTORY  Chief Complaint Fall  HPI [name] is a 64 y.o. female to complain of right lateral rib pain. Patient has a history of falling 2 weeks ago against a chair that she tripped on. There was no head injury or loss of consciousness per patient and daughter. Daughter states that she is continued to have pain with deep breaths. Patient is been taking Tylenol without complete relief. She has not taken any Tylenol today.She denies any other pain elsewhere. Currently her pain is 8 out of 10.   Past Medical History  Diagnosis Date  . Cancer ([place])   . Hypertension   . GERD (gastroesophageal reflux disease)   . Renal disorder   . Anemia     Patient Active Problem List   Diagnosis Date Noted  . 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 Essential hypertension 07/06/2014  . H/O kidney transplant 07/06/2014    Past Surgical History  Procedure Laterality Date  . Kidney transplant    . Abdominal hysterectomy    . Nephrectomy transplanted organ    . Av fistula placement      Current Outpatient Rx  Name  Route  Sig  Dispense  Refill  . calcitRIOL (ROCALTROL) 0.25 MCG capsule   Oral   Take 0.25 mcg by mouth daily.         . citalopram (CELEXA) 20 MG tablet   Oral   Take 20 mg by mouth daily.         . folic acid (FOLVITE) 1 MG tablet   Oral   Take 1 mg by mouth daily.         . furosemide (LASIX) 20 MG tablet   Oral   Take 20 mg by mouth daily.         Marland Kitchen HYDROcodone-acetaminophen (NORCO/VICODIN) 5-325 MG tablet   Oral   Take 1 tablet by mouth every 6 (six) hours as needed for moderate pain.   20 tablet   0   . losartan (COZAAR) 25 MG tablet   Oral   Take 25 mg by mouth daily.         . meloxicam (MOBIC) 15 MG tablet   Oral   Take  15 mg by mouth daily.         . metoprolol succinate (TOPROL-XL) 25 MG 24 hr tablet   Oral   Take 25 mg by mouth daily.         . mycophenolate (MYFORTIC) 180 MG EC tablet   Oral   Take 180 mg by mouth 2 (two) times daily.         Marland Kitchen POTASSIUM PO   Oral   Take 1 tablet by mouth 2 (two) times daily.         . pravastatin (PRAVACHOL) 20 MG tablet   Oral   Take 20 mg by mouth at bedtime.         . predniSONE (DELTASONE) 5 MG tablet   Oral   Take 5 mg by mouth daily.          . sirolimus (RAPAMUNE) 1 MG tablet   Oral   Take 6 mg by mouth daily.           Allergies Lisinopril  History reviewed. No pertinent family history.  Social History Social History  Substance Use Topics  .  Smoking status: Former Research scientist (life sciences)  . Smokeless tobacco: Never Used  . Alcohol Use: No    Review of Systems Constitutional: No fever/chills Eyes: No visual changes. ENT: No trauma Cardiovascular: Denies chest pain. Respiratory: Denies shortness of breath. Right rib pain with deep breaths. Gastrointestinal: No abdominal pain.  No nausea, no vomiting.   Musculoskeletal: Negative for back pain. Rib pain. Skin: Negative for rash. Neurological: Negative for headaches, focal weakness or numbness.  10-point ROS otherwise negative.  ____________________________________________   PHYSICAL EXAM:  VITAL SIGNS: ED Triage Vitals  Enc Vitals Group     BP --      Pulse Rate 02/01/15 1012 75     Resp --      Temp 02/01/15 1012 98.4 F (36.9 C)     Temp Source 02/01/15 1012 Oral     SpO2 02/01/15 1012 94 %     Weight 02/01/15 1012 213 lb (96.616 kg)     Height 02/01/15 1012 4\' 11"  (1.499 m)     Head Cir --      Peak Flow --      Pain Score 02/01/15 1012 8     Pain Loc --      Pain Edu? --      Excl. in [place]? --     Constitutional: Alert and oriented. Well appearing and in no acute distress. Eyes: Conjunctivae are normal. PERRL. EOMI. Head: Atraumatic. Nose: No  congestion/rhinnorhea. Neck: No stridor.  No cervical tenderness on palpation posteriorly. Range of motion of neck without any restrictions. Cardiovascular: Normal rate, regular rhythm. Grossly normal heart sounds.  Good peripheral circulation. Respiratory: Normal respiratory effort.  No retractions. Lungs CTAB.  Tenderness right lateral rib cage without any gross deformity. No soft tissue edema present. Gastrointestinal: Soft and nontender. No distention.  Musculoskeletal: No lower extremity tenderness nor edema.  No joint effusions. Neurologic:  Normal speech and language. No gross focal neurologic deficits are appreciated. No gait instability. Skin:  Skin is warm, dry and intact. No rash noted. No ecchymosis or abrasions were noted. Psychiatric: Mood and affect are normal. Speech and behavior are normal.  ____________________________________________   LABS (all labs ordered are listed, but only abnormal results are displayed)  Labs Reviewed - No data to display   RADIOLOGY  Right rib x-ray per radiologist shows no fracture. I, Johnn Hai, personally viewed and evaluated these images (plain radiographs) as part of my medical decision making.  ____________________________________________   PROCEDURES  Procedure(s) performed: None  Critical Care performed: No  ____________________________________________   INITIAL IMPRESSION / ASSESSMENT AND PLAN / ED COURSE  Pertinent labs & imaging results that were available during my care of the patient were reviewed by me and considered in my medical decision making (see chart for details).  Patient is to follow-up with her primary care doctor if any continued problems. She is to continue taking Tylenol arthritis and began taking Norco every 6 hours as needed for severe pain. Daughter was present and will make arrangements for follow-up. ____________________________________________   FINAL CLINICAL IMPRESSION(S) / ED  DIAGNOSES  Final diagnoses:  Contusion of ribs, right, initial encounter      Johnn Hai, PA-C 02/01/15 1218  Ahmed Prima, MD 02/01/15 1538

## 2015-02-01 NOTE — ED Notes (Signed)
Pt reports that she fell 2 weeks ago and is now having pain in her right ribcage. She states she tripped over chair and hit her ribs then. Daughter states pain has been steady for 1 week and "every time she breathes it hurts." Pt alert, seems a bit confused and looks to daughter to answer questions.

## 2015-02-01 NOTE — ED Notes (Signed)
Pt presents to ER with rib pain due to a fall when she tripped and landed on right side. Pt denies hip pain . No obvious injuries present.

## 2015-02-01 NOTE — Discharge Instructions (Signed)
Chest Contusion A chest contusion is a deep bruise on your chest area. Contusions are the result of an injury that caused bleeding under the skin. A chest contusion may involve bruising of the skin, muscles, or ribs. The contusion may turn blue, purple, or yellow. Minor injuries will give you a painless contusion, but more severe contusions may stay painful and swollen for a few weeks. CAUSES  A contusion is usually caused by a blow, trauma, or direct force to an area of the body. SYMPTOMS   Swelling and redness of the injured area.  Discoloration of the injured area.  Tenderness and soreness of the injured area.  Pain. DIAGNOSIS  The diagnosis can be made by taking a history and performing a physical exam. An X-ray, CT scan, or MRI may be needed to determine if there were any associated injuries, such as broken bones (fractures) or internal injuries. TREATMENT  Often, the best treatment for a chest contusion is resting, icing, and applying cold compresses to the injured area. Deep breathing exercises may be recommended to reduce the risk of pneumonia. Over-the-counter medicines may also be recommended for pain control. HOME CARE INSTRUCTIONS   Put ice on the injured area.  Put ice in a plastic bag.  Place a towel between your skin and the bag.  Leave the ice on for 15-20 minutes, 03-04 times a day.  Only take over-the-counter or prescription medicines as directed by your caregiver. Your caregiver may recommend avoiding anti-inflammatory medicines (aspirin, ibuprofen, and naproxen) for 48 hours because these medicines may increase bruising.  Rest the injured area.  Perform deep-breathing exercises as directed by your caregiver.  Stop smoking if you smoke.  Do not lift objects over 5 pounds (2.3 kg) for 3 days or longer if recommended by your caregiver. SEEK IMMEDIATE MEDICAL CARE IF:   You have increased bruising or swelling.  You have pain that is getting worse.  You have  difficulty breathing.  You have dizziness, weakness, or fainting.  You have blood in your urine or stool.  You cough up or vomit blood.  Your swelling or pain is not relieved with medicines. MAKE SURE YOU:   Understand these instructions.  Will watch your condition.  Will get help right away if you are not doing well or get worse.   This information is not intended to replace advice given to you by your health care provider. Make sure you discuss any questions you have with your health care provider.   Document Released: 10/26/2000 Document Revised: 10/26/2011 Document Reviewed: 07/25/2011 Elsevier Interactive Patient Education 2016 [place] with your primary care doctor if any continued problems. Do not take Tylenol arthritis with this medication. Norco as needed for severe pain only yesterday active.

## 2015-02-13 ENCOUNTER — Emergency Department
Admission: EM | Admit: 2015-02-13 | Discharge: 2015-02-13 | Disposition: A | Discharge status: Home / Self Care | Payer: Medicare Other | Attending: Emergency Medicine | Admitting: Emergency Medicine

## 2015-02-13 DIAGNOSIS — Y998 Other external cause status: Secondary | ICD-10-CM | POA: Diagnosis not present

## 2015-02-13 DIAGNOSIS — Y93E1 Activity, personal bathing and showering: Secondary | ICD-10-CM | POA: Insufficient documentation

## 2015-02-13 DIAGNOSIS — Z79899 Other long term (current) drug therapy: Secondary | ICD-10-CM | POA: Diagnosis not present

## 2015-02-13 DIAGNOSIS — S0003XA Contusion of scalp, initial encounter: Secondary | ICD-10-CM | POA: Diagnosis not present

## 2015-02-13 DIAGNOSIS — Z7952 Long term (current) use of systemic steroids: Secondary | ICD-10-CM | POA: Insufficient documentation

## 2015-02-13 DIAGNOSIS — Z87891 Personal history of nicotine dependence: Secondary | ICD-10-CM | POA: Diagnosis not present

## 2015-02-13 DIAGNOSIS — I10 Essential (primary) hypertension: Secondary | ICD-10-CM | POA: Diagnosis not present

## 2015-02-13 DIAGNOSIS — Z791 Long term (current) use of non-steroidal anti-inflammatories (NSAID): Secondary | ICD-10-CM | POA: Insufficient documentation

## 2015-02-13 DIAGNOSIS — W2209XA Striking against other stationary object, initial encounter: Secondary | ICD-10-CM | POA: Diagnosis not present

## 2015-02-13 DIAGNOSIS — Y9289 Other specified places as the place of occurrence of the external cause: Secondary | ICD-10-CM | POA: Insufficient documentation

## 2015-02-13 DIAGNOSIS — S0990XA Unspecified injury of head, initial encounter: Secondary | ICD-10-CM | POA: Diagnosis present

## 2015-02-13 NOTE — ED Notes (Signed)
Pt states struck head on sharp cabinet while getting out of bath. Pt with small hematoma noted to right parietal scalp. Cms intact in all extremities, no loc per pt. Pt denies neck pain.

## 2015-02-13 NOTE — Discharge Instructions (Signed)

## 2015-02-13 NOTE — ED Provider Notes (Signed)
[facility] Emergency Department Provider Note ____________________________________________  Time seen: Approximately 11:11 PM  I have reviewed the triage vital signs and the nursing notes.   HISTORY  Chief Complaint Head Injury  HPI [name] is a 64 y.o. female who presents to the emergency department for evaluation of a head injury. While getting out of the shower she struck her head on the corner of the cabinet. She denies loss of consciousness. She denies vomiting or feeling dizzy. She has had no trouble concentrating. She denies laceration. She is not on any blood thinners.    Past Medical History  Diagnosis Date  . Cancer ([place])   . Hypertension   . GERD (gastroesophageal reflux disease)   . Renal disorder   . Anemia     Patient Active Problem List   Diagnosis Date Noted  . 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 Essential hypertension 07/06/2014  . H/O kidney transplant 07/06/2014    Past Surgical History  Procedure Laterality Date  . Kidney transplant    . Abdominal hysterectomy    . Nephrectomy transplanted organ    . Av fistula placement      Current Outpatient Rx  Name  Route  Sig  Dispense  Refill  . calcitRIOL (ROCALTROL) 0.25 MCG capsule   Oral   Take 0.25 mcg by mouth daily.         . citalopram (CELEXA) 20 MG tablet   Oral   Take 20 mg by mouth daily.         . folic acid (FOLVITE) 1 MG tablet   Oral   Take 1 mg by mouth daily.         . furosemide (LASIX) 20 MG tablet   Oral   Take 20 mg by mouth daily.         Marland Kitchen HYDROcodone-acetaminophen (NORCO/VICODIN) 5-325 MG tablet   Oral   Take 1 tablet by mouth every 6 (six) hours as needed for moderate pain.   20 tablet   0   . losartan (COZAAR) 25 MG tablet   Oral   Take 25 mg by mouth daily.         . meloxicam (MOBIC) 15 MG tablet   Oral   Take 15 mg by mouth daily.         . metoprolol succinate (TOPROL-XL) 25 MG 24 hr tablet   Oral   Take  25 mg by mouth daily.         . mycophenolate (MYFORTIC) 180 MG EC tablet   Oral   Take 180 mg by mouth 2 (two) times daily.         Marland Kitchen POTASSIUM PO   Oral   Take 1 tablet by mouth 2 (two) times daily.         . pravastatin (PRAVACHOL) 20 MG tablet   Oral   Take 20 mg by mouth at bedtime.         . predniSONE (DELTASONE) 5 MG tablet   Oral   Take 5 mg by mouth daily.          . sirolimus (RAPAMUNE) 1 MG tablet   Oral   Take 6 mg by mouth daily.           Allergies Lisinopril  No family history on file.  Social History Social History  Substance Use Topics  . Smoking status: Former Research scientist (life sciences)  . Smokeless tobacco: Never Used  . Alcohol Use: No    Review  of Systems Constitutional: No fever/chills Eyes: No visual changes. ENT: No sore throat. Cardiovascular: Denies chest pain. Respiratory: Denies shortness of breath. Gastrointestinal: No abdominal pain.  No nausea, no vomiting. Genitourinary: Negative for dysuria. Musculoskeletal: Negative for back pain. Skin: Negative for rash. Neurological: Negative for headaches, focal weakness or numbness.  10-point ROS otherwise negative.  ____________________________________________   PHYSICAL EXAM:  VITAL SIGNS: ED Triage Vitals  Enc Vitals Group     BP 02/13/15 2159 153/68 mmHg     Pulse Rate 02/13/15 2159 67     Resp 02/13/15 2159 16     Temp 02/13/15 2159 98 F (36.7 C)     Temp Source 02/13/15 2159 Oral     SpO2 02/13/15 2159 100 %     Weight 02/13/15 2159 210 lb (95.255 kg)     Height 02/13/15 2159 4\' 11"  (1.499 m)     Head Cir --      Peak Flow --      Pain Score 02/13/15 2159 8     Pain Loc --      Pain Edu? --      Excl. in [place]? --     Constitutional: Alert and oriented. Well appearing and in no acute distress. Eyes: Conjunctivae are normal. PERRL. EOMI. Head: Hematoma palpable in the parietal area without associated abrasion or laceration. Nose: No congestion/rhinnorhea. Mouth/Throat:  Mucous membranes are moist.  Oropharynx non-erythematous. Neck: No stridor.   Cardiovascular: Good peripheral circulation. Respiratory: Normal respiratory effort.   Gastrointestinal: Soft and nontender. No distention. Musculoskeletal: No lower extremity tenderness nor edema.  No joint effusions. Neurologic:  Normal speech and language. No gross focal neurologic deficits are appreciated. No gait instability. Romberg negative. Cranial nerves intact as tested. Skin:  Skin is warm, dry and intact. No rash noted. Psychiatric: Mood and affect are normal. Speech and behavior are normal.  ____________________________________________   LABS (all labs ordered are listed, but only abnormal results are displayed)  Labs Reviewed - No data to display ____________________________________________  EKG   ____________________________________________  RADIOLOGY  Not indicated ____________________________________________   PROCEDURES  Procedure(s) performed: None  Critical Care performed: No  ____________________________________________   INITIAL IMPRESSION / ASSESSMENT AND PLAN / ED COURSE  Pertinent labs & imaging results that were available during my care of the patient were reviewed by me and considered in my medical decision making (see chart for details).  Patient was advised to apply an ice pack to the hematoma. She was advised to return to the emergency department for any symptoms of concern. ____________________________________________   FINAL CLINICAL IMPRESSION(S) / ED DIAGNOSES  Final diagnoses:  Scalp hematoma, initial encounter      Victorino Dike, FNP 02/13/15 2316  Nance Pear, MD 02/14/15 0001

## 2015-02-13 NOTE — ED Notes (Signed)
AAOx3.  Skin warm and dry.  NAD 

## 2015-02-15 HISTORY — PX: BREAST BIOPSY: SHX20

## 2015-06-05 ENCOUNTER — Emergency Department: Payer: Medicare Other

## 2015-06-05 ENCOUNTER — Emergency Department
Admission: EM | Admit: 2015-06-05 | Discharge: 2015-06-05 | Disposition: A | Discharge status: Other Acute Inpatient Hospital | Payer: Medicare Other | Attending: Emergency Medicine | Admitting: Emergency Medicine

## 2015-06-05 DIAGNOSIS — Z859 Personal history of malignant neoplasm, unspecified: Secondary | ICD-10-CM | POA: Diagnosis not present

## 2015-06-05 DIAGNOSIS — Z87891 Personal history of nicotine dependence: Secondary | ICD-10-CM | POA: Insufficient documentation

## 2015-06-05 DIAGNOSIS — Z940 Kidney transplant status: Secondary | ICD-10-CM | POA: Diagnosis not present

## 2015-06-05 DIAGNOSIS — N12 Tubulo-interstitial nephritis, not specified as acute or chronic: Secondary | ICD-10-CM

## 2015-06-05 DIAGNOSIS — I10 Essential (primary) hypertension: Secondary | ICD-10-CM | POA: Insufficient documentation

## 2015-06-05 DIAGNOSIS — Z79899 Other long term (current) drug therapy: Secondary | ICD-10-CM | POA: Diagnosis not present

## 2015-06-05 DIAGNOSIS — R1031 Right lower quadrant pain: Secondary | ICD-10-CM | POA: Insufficient documentation

## 2015-06-05 DIAGNOSIS — Z7952 Long term (current) use of systemic steroids: Secondary | ICD-10-CM | POA: Diagnosis not present

## 2015-06-05 LAB — URINALYSIS COMPLETE WITH MICROSCOPIC (ARMC ONLY)
Bilirubin Urine: NEGATIVE
Glucose, UA: NEGATIVE mg/dL
Ketones, ur: NEGATIVE mg/dL
Nitrite: NEGATIVE
PH: 5 (ref 5.0–8.0)
Protein, ur: 100 mg/dL — AB
Specific Gravity, Urine: 1.012 (ref 1.005–1.030)

## 2015-06-05 LAB — COMPREHENSIVE METABOLIC PANEL
ALK PHOS: 62 U/L (ref 38–126)
ALT: 24 U/L (ref 14–54)
AST: 40 U/L (ref 15–41)
Albumin: 2.8 g/dL — ABNORMAL LOW (ref 3.5–5.0)
Anion gap: 12 (ref 5–15)
BUN: 37 mg/dL — AB (ref 6–20)
CALCIUM: 8.2 mg/dL — AB (ref 8.9–10.3)
CHLORIDE: 102 mmol/L (ref 101–111)
CO2: 24 mmol/L (ref 22–32)
CREATININE: 3.36 mg/dL — AB (ref 0.44–1.00)
GFR calc non Af Amer: 13 mL/min — ABNORMAL LOW (ref >60)
GFR, EST AFRICAN AMERICAN: 16 mL/min — AB (ref >60)
Glucose, Bld: 176 mg/dL — ABNORMAL HIGH (ref 65–99)
Potassium: 3.4 mmol/L — ABNORMAL LOW (ref 3.5–5.1)
Sodium: 138 mmol/L (ref 135–145)
Total Bilirubin: 1.4 mg/dL — ABNORMAL HIGH (ref 0.3–1.2)
Total Protein: 6.9 g/dL (ref 6.5–8.1)

## 2015-06-05 LAB — CBC
HCT: 30.9 % — ABNORMAL LOW (ref 35.0–47.0)
HEMOGLOBIN: 10 g/dL — AB (ref 12.0–16.0)
MCH: 23.7 pg — AB (ref 26.0–34.0)
MCHC: 32.5 g/dL (ref 32.0–36.0)
MCV: 72.9 fL — AB (ref 80.0–100.0)
PLATELETS: 111 10*3/uL — AB (ref 150–440)
RBC: 4.24 MIL/uL (ref 3.80–5.20)
RDW: 15.6 % — ABNORMAL HIGH (ref 11.5–14.5)
WBC: 13.2 10*3/uL — AB (ref 3.6–11.0)

## 2015-06-05 LAB — LIPASE, BLOOD: LIPASE: 10 U/L — AB (ref 11–51)

## 2015-06-05 LAB — LACTIC ACID, PLASMA: Lactic Acid, Venous: 1.3 mmol/L (ref 0.5–2.0)

## 2015-06-05 MED ORDER — ONDANSETRON HCL 4 MG/2ML IJ SOLN
4.0000 mg | Freq: Once | INTRAMUSCULAR | Status: AC
  Administered 2015-06-05: 4 mg via INTRAVENOUS
  Filled 2015-06-05: qty 2

## 2015-06-05 MED ORDER — HYDROMORPHONE HCL 1 MG/ML IJ SOLN
0.5000 mg | Freq: Once | INTRAMUSCULAR | Status: AC
  Administered 2015-06-05: 0.5 mg via INTRAVENOUS
  Filled 2015-06-05: qty 1

## 2015-06-05 MED ORDER — SODIUM CHLORIDE 0.9 % IV BOLUS (SEPSIS)
500.0000 mL | Freq: Once | INTRAVENOUS | Status: AC
  Administered 2015-06-05: 500 mL via INTRAVENOUS

## 2015-06-05 MED ORDER — DEXTROSE 5 % IV SOLN
1.0000 g | Freq: Once | INTRAVENOUS | Status: AC
  Administered 2015-06-05: 1 g via INTRAVENOUS
  Filled 2015-06-05: qty 10

## 2015-06-05 MED ORDER — DIATRIZOATE MEGLUMINE & SODIUM 66-10 % PO SOLN
15.0000 mL | ORAL | Status: AC
  Administered 2015-06-05 (×2): 15 mL via ORAL
  Filled 2015-06-05: qty 30

## 2015-06-05 NOTE — ED Notes (Signed)
Pt asked for a warm blanket but after rechecking their temp (new temp @ 1921: 100.7 F ) they were informed that she could only have a regular sheet. Pt. Asked for pain medication (verbalized that it was all throughout their body :pain scale of 10) RN has been notified

## 2015-06-05 NOTE — ED Notes (Signed)
Pt incontinent of stool. Pt cleaned and changed.

## 2015-06-05 NOTE — ED Notes (Signed)
Attempted to call report for the third time; transfer line reports that they were hung up on.  Transfer line made aware that pt is on the way via EMS.  Report given to my charge RN in the event [facility] calls back for report.

## 2015-06-05 NOTE — ED Notes (Signed)
Attempted to call report via transfer line; Tanya, charge RN unable to take report at this time and ask that we call back in 15 minutes.  Info passed on to next shift RN, Jun 13, 2022.

## 2015-06-05 NOTE — ED Notes (Signed)
Pt placed on 2L nasal cannula to maintain sat > 90%

## 2015-06-05 NOTE — ED Notes (Signed)
Esign pad not working; pt Barrister's clerk form for transfer

## 2015-06-05 NOTE — ED Provider Notes (Signed)
Time Seen: Approximately 1515  I have reviewed the triage notes  Chief Complaint: Abdominal Pain   History of Present Illness: [name] is a 65 y.o. female who presents with abdominal pain that started yesterday. According to EMS the patient's been having trouble with her bowels and has a history of a renal transplant. Patient is very poor historian and can't tell me when, her transplant occurred. She states her daughter may be able to answer more questions for [place]. She states that she knows it was done in [place]. The patient has a presentation with a fever. She denies any nausea or vomiting. When asked where her abdominal pain is she points to the right lower quadrant. She is not sure which side her renal transplant is located. She denies any back or flank pain. She denies any chest pain or shortness of breath.   Past Medical History  Diagnosis Date  . Cancer ([place])   . Hypertension   . GERD (gastroesophageal reflux disease)   . Renal disorder   . Anemia     Patient Active Problem List   Diagnosis Date Noted  . 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 Essential hypertension 07/06/2014  . H/O kidney transplant 07/06/2014    Past Surgical History  Procedure Laterality Date  . Kidney transplant    . Abdominal hysterectomy    . Nephrectomy transplanted organ    . Av fistula placement      Past Surgical History  Procedure Laterality Date  . Kidney transplant    . Abdominal hysterectomy    . Nephrectomy transplanted organ    . Av fistula placement      Current Outpatient Rx  Name  Route  Sig  Dispense  Refill  . calcitRIOL (ROCALTROL) 0.25 MCG capsule   Oral   Take 0.25 mcg by mouth daily.         . citalopram (CELEXA) 20 MG tablet   Oral   Take 20 mg by mouth daily.         . folic acid (FOLVITE) 1 MG tablet   Oral   Take 1 mg by mouth daily.         . furosemide (LASIX) 20 MG tablet   Oral   Take 20 mg by mouth daily.         Marland Kitchen  HYDROcodone-acetaminophen (NORCO/VICODIN) 5-325 MG tablet   Oral   Take 1 tablet by mouth every 6 (six) hours as needed for moderate pain.   20 tablet   0   . losartan (COZAAR) 25 MG tablet   Oral   Take 25 mg by mouth daily.         . meloxicam (MOBIC) 15 MG tablet   Oral   Take 15 mg by mouth daily.         . metoprolol succinate (TOPROL-XL) 25 MG 24 hr tablet   Oral   Take 25 mg by mouth daily.         . mycophenolate (MYFORTIC) 180 MG EC tablet   Oral   Take 180 mg by mouth 2 (two) times daily.         Marland Kitchen POTASSIUM PO   Oral   Take 1 tablet by mouth 2 (two) times daily.         . pravastatin (PRAVACHOL) 20 MG tablet   Oral   Take 20 mg by mouth at bedtime.         . predniSONE (DELTASONE) 5 MG tablet  Oral   Take 5 mg by mouth daily.          . sirolimus (RAPAMUNE) 1 MG tablet   Oral   Take 6 mg by mouth daily.           Allergies:  Lisinopril  Family History: No family history on file.  Social History: Social History  Substance Use Topics  . Smoking status: Former Research scientist (life sciences)  . Smokeless tobacco: Never Used  . Alcohol Use: No     Review of Systems:   10 point review of systems was performed and was otherwise negative: Review of systems is difficult due to the patient being a poor historian and I'm not sure what her baseline mental status is. Constitutional: Fever identified here in emergency department Eyes: No visual disturbances ENT: No sore throat, ear pain Cardiac: No chest pain Respiratory: No shortness of breath, wheezing, or stridor Abdomen: Patient points to the right lower quadrant Endocrine: No weight loss, No night sweats Extremities: No peripheral edema, cyanosis Skin: No rashes, easy bruising Neurologic: No focal weakness, trouble with speech or swollowing Urologic: No dysuria, Hematuria, or urinary frequency   Physical Exam:  ED Triage Vitals  Enc Vitals Group     BP 06/05/15 1436 133/66 mmHg     Pulse Rate  06/05/15 1436 112     Resp 06/05/15 1436 20     Temp 06/05/15 1436 100.2 F (37.9 C)     Temp Source 06/05/15 1436 Oral     SpO2 06/05/15 1436 100 %     Weight 06/05/15 1436 203 lb 11.2 oz (92.398 kg)     Height 06/05/15 1436 4\' 11"  (1.499 m)     Head Cir --      Peak Flow --      Pain Score --      Pain Loc --      Pain Edu? --      Excl. in [place]? --     General: Awake , Alert , and Oriented times 3; GCS 15 Head: Normal cephalic , atraumatic Eyes: Pupils equal , round, reactive to light Nose/Throat: No nasal drainage, patent upper airway without erythema or exudate. Very dry mucous membranes and poor dentition Neck: Supple, Full range of motion, No anterior adenopathy or palpable thyroid masses Lungs: Clear to ascultation without wheezes , rhonchi, or rales Heart: Regular rate, regular rhythm without murmurs , gallops , or rubs Abdomen: Mild tenderness to deep palpation without rebound, guarding , or rigidity; bowel sounds positive and symmetric in all 4 quadrants. No organomegaly .        Extremities: 2 plus symmetric pulses. No edema, clubbing or cyanosis Neurologic: normal ambulation, Motor symmetric without deficits, sensory intact Skin: warm, dry, no rashes   Labs:   All laboratory work was reviewed including any pertinent negatives or positives listed below:  Labs Reviewed  CBC - Abnormal; Notable for the following:    WBC 13.2 (*)    Hemoglobin 10.0 (*)    HCT 30.9 (*)    MCV 72.9 (*)    MCH 23.7 (*)    RDW 15.6 (*)    Platelets 111 (*)    All other components within normal limits  COMPREHENSIVE METABOLIC PANEL - Abnormal; Notable for the following:    Potassium 3.4 (*)    Glucose, Bld 176 (*)    BUN 37 (*)    Creatinine, Ser 3.36 (*)    Calcium 8.2 (*)    Albumin 2.8 (*)  Total Bilirubin 1.4 (*)    GFR calc non Af Amer 13 (*)    GFR calc Af Amer 16 (*)    All other components within normal limits  LIPASE, BLOOD - Abnormal; Notable for the following:     Lipase 10 (*)    All other components within normal limits  URINALYSIS COMPLETEWITH MICROSCOPIC ([facility] ONLY) - Abnormal; Notable for the following:    Color, Urine AMBER (*)    APPearance CLOUDY (*)    Hgb urine dipstick 2+ (*)    Protein, ur 100 (*)    Leukocytes, UA 2+ (*)    Bacteria, UA MANY (*)    Squamous Epithelial / LPF 0-5 (*)    All other components within normal limits  URINE CULTURE  CULTURE, BLOOD (ROUTINE X 2)  CULTURE, BLOOD (ROUTINE X 2)  LACTIC ACID, PLASMA  LACTIC ACID, PLASMA   Review of laboratory work showed some comparison renal insufficiency and also significant urinary tract infection. Blood cultures 2 and urine culture pending at this time      Radiology: *  CT ABDOMEN AND PELVIS WITHOUT CONTRAST  TECHNIQUE: Multidetector CT imaging of the abdomen and pelvis was performed following the standard protocol without IV contrast.  COMPARISON: None.  FINDINGS: Despite efforts by the technologist and patient, motion artifact is present on today's exam and could not be eliminated. This reduces exam sensitivity and specificity.  Lower chest: Mild airspace opacity in both lower lobes, part of which probably represents volume loss in the right lower lobe. Moderate pericardial effusion. Trace bilateral pleural effusions.  Hepatobiliary: Cholecystectomy.  Pancreas: Unremarkable  Spleen: Unremarkable  Adrenals/Urinary Tract: Left nephrectomy, severe atrophy of the native right kidney. There stranding around the transplant kidney in the right iliac fossa, but no overt hydronephrosis. Urinary bladder unremarkable.  Stomach/Bowel: Appendix not well seen. Orally administered contrast extends through to the rectum. There is some poor definition of fat planes in the right lower quadrant adjacent to the transplant kidney. No dilated bowel.  Vascular/Lymphatic: Aortoiliac atherosclerotic vascular disease.  Reproductive: Uterus absent. No contour  abnormality in the expected location of the ovaries.  Other: Trace free pelvic fluid.  Musculoskeletal: Ventral midline hernia mesh in the vicinity of the umbilicus. No significant recurrent hernia in this vicinity.  Avascular necrosis of both femoral heads, more notable on the right, without significant collapse. Degenerative loss of articular space in both hips.  IMPRESSION: 1. There stranding around the right pelvic transplant kidney but no hydronephrosis or hydroureter. Correlate with any clinical indicators of rejection or inflammation, including renal function and urinalysis. 2. Small amount of free pelvic fluid, etiology unknown. 3. Moderate size pericardial effusion. If the patient has jugular venous distension or other signs of tamponade such as backs triad, then further workup with echocardiography would be suggested. The chronicity of this pericardial effusion is not established. There are also trace bilateral pleural effusions. 4. Faint patchy airspace opacities in the lung bases, primarily from volume loss. 5. Aortoiliac atherosclerotic vascular disease. 6. 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 7. Appendix not well seen. Reduced negative predictive value for appendicitis given the stranding in the right lower quadrant in the vicinity of the renal transplant.      I personally reviewed the radiologic studies    Critical Care:  CRITICAL CARE Performed by: Daymon Larsen   Total critical care time: 37 minutes  Critical care time was exclusive of separately billable procedures  and treating other patients.  Critical care was necessary to treat or prevent imminent or life-threatening deterioration.  Critical care was time spent personally by me on the following activities: development of treatment plan with patient and/or surrogate as well as nursing, discussions with consultants, evaluation  of patient's response to treatment, examination of patient, obtaining history from patient or surrogate, ordering and performing treatments and interventions, ordering and review of laboratory studies, ordering and review of radiographic studies, pulse oximetry and re-evaluation of patient's condition. , evaluation, treatment for urinary tract infection with renal insufficiency and a renal transplant patient Initiation of transfer  ED Course:  Patient's stay here was uneventful and she was initiated on IV Rocephin after panculture was performed. Her differential includes acute appendicitis and/or rejection of her renal transplant which is most likely the highest concern. Patient's case was reviewed with local nephrology who felt since patient is a transplant patient that she should be transferred to a tertiary care center that can handle this complication. The patient has arrangements made with [facility] to be transferred to the hospital under the nephrology/transplant service. Was given IV Dilaudid here for pain and also initiated on fluid bolus. Conversation with family in representative at the bedside agrees with transfer at this time.    Assessment:  Renal insufficiency Urinary tract infection Possible renal transplant rejection   Final Clinical Impression:   Final diagnoses:  None     Plan: * Transferred to [facility].            Daymon Larsen, MD 06/05/15 2011

## 2015-06-05 NOTE — ED Notes (Signed)
Pt came to ED c/o abdominal pain since yesterday. Pt reports lower abdominal pain. Per EMS, patient was having trouble moving her bowels but pt denies. Pt has history of kidney transplant. Diagnosed with UTI and taking amoxicillin.

## 2015-06-05 NOTE — ED Notes (Signed)
Pt transported to CT ?

## 2015-06-06 LAB — BLOOD CULTURE ID PANEL (REFLEXED)
ACINETOBACTER BAUMANNII: NOT DETECTED
CANDIDA KRUSEI: NOT DETECTED
CANDIDA PARAPSILOSIS: NOT DETECTED
CARBAPENEM RESISTANCE: NOT DETECTED
Candida albicans: NOT DETECTED
Candida glabrata: NOT DETECTED
Candida tropicalis: NOT DETECTED
ENTEROCOCCUS SPECIES: NOT DETECTED
Enterobacter cloacae complex: NOT DETECTED
Enterobacteriaceae species: DETECTED — AB
Escherichia coli: NOT DETECTED
Haemophilus influenzae: NOT DETECTED
KLEBSIELLA OXYTOCA: NOT DETECTED
KLEBSIELLA PNEUMONIAE: DETECTED — AB
LISTERIA MONOCYTOGENES: NOT DETECTED
Methicillin resistance: NOT DETECTED
Neisseria meningitidis: NOT DETECTED
Proteus species: NOT DETECTED
Pseudomonas aeruginosa: NOT DETECTED
SERRATIA MARCESCENS: NOT DETECTED
STAPHYLOCOCCUS AUREUS BCID: NOT DETECTED
Staphylococcus species: NOT DETECTED
Streptococcus agalactiae: NOT DETECTED
Streptococcus pneumoniae: NOT DETECTED
Streptococcus pyogenes: NOT DETECTED
Streptococcus species: NOT DETECTED
VANCOMYCIN RESISTANCE: NOT DETECTED

## 2015-06-06 NOTE — Progress Notes (Signed)
Reported BCID results with Klebsiella pneumoniae to RN and MD at [place] [facility], PharmD Clinical Pharmacist

## 2015-06-07 LAB — URINE CULTURE

## 2015-06-08 LAB — CULTURE, BLOOD (ROUTINE X 2)

## 2015-06-09 NOTE — Progress Notes (Signed)
Reported blood culture sensitivities to RN Arliss Journey at [place], PharmD Clinical Pharmacist

## 2015-06-17 ENCOUNTER — Other Ambulatory Visit: Payer: Self-pay | Admitting: Preventative Medicine

## 2015-06-17 DIAGNOSIS — Z1231 Encounter for screening mammogram for malignant neoplasm of breast: Secondary | ICD-10-CM

## 2015-07-24 ENCOUNTER — Ambulatory Visit: Payer: Medicare Other

## 2015-07-26 ENCOUNTER — Emergency Department
Admission: EM | Admit: 2015-07-26 | Discharge: 2015-07-26 | Disposition: A | Discharge status: Home / Self Care | Payer: Medicare Other | Attending: Emergency Medicine | Admitting: Emergency Medicine

## 2015-07-26 DIAGNOSIS — Z87891 Personal history of nicotine dependence: Secondary | ICD-10-CM | POA: Insufficient documentation

## 2015-07-26 DIAGNOSIS — Z79899 Other long term (current) drug therapy: Secondary | ICD-10-CM | POA: Diagnosis not present

## 2015-07-26 DIAGNOSIS — I10 Essential (primary) hypertension: Secondary | ICD-10-CM | POA: Insufficient documentation

## 2015-07-26 DIAGNOSIS — R197 Diarrhea, unspecified: Secondary | ICD-10-CM | POA: Diagnosis present

## 2015-07-26 DIAGNOSIS — A0472 Enterocolitis due to Clostridium difficile, not specified as recurrent: Secondary | ICD-10-CM

## 2015-07-26 DIAGNOSIS — A047 Enterocolitis due to Clostridium difficile: Secondary | ICD-10-CM | POA: Insufficient documentation

## 2015-07-26 LAB — URINALYSIS COMPLETE WITH MICROSCOPIC (ARMC ONLY)
BACTERIA UA: NONE SEEN
Bilirubin Urine: NEGATIVE
GLUCOSE, UA: NEGATIVE mg/dL
Hgb urine dipstick: NEGATIVE
Ketones, ur: NEGATIVE mg/dL
Nitrite: NEGATIVE
Protein, ur: NEGATIVE mg/dL
Specific Gravity, Urine: 1.011 (ref 1.005–1.030)
pH: 7 (ref 5.0–8.0)

## 2015-07-26 LAB — CBC
HEMATOCRIT: 25.7 % — AB (ref 35.0–47.0)
Hemoglobin: 8.5 g/dL — ABNORMAL LOW (ref 12.0–16.0)
MCH: 24.7 pg — AB (ref 26.0–34.0)
MCHC: 33.2 g/dL (ref 32.0–36.0)
MCV: 74.6 fL — ABNORMAL LOW (ref 80.0–100.0)
Platelets: 200 10*3/uL (ref 150–440)
RBC: 3.45 MIL/uL — ABNORMAL LOW (ref 3.80–5.20)
RDW: 18 % — AB (ref 11.5–14.5)
WBC: 4 10*3/uL (ref 3.6–11.0)

## 2015-07-26 LAB — COMPREHENSIVE METABOLIC PANEL
ALBUMIN: 3.3 g/dL — AB (ref 3.5–5.0)
ALT: 9 U/L — ABNORMAL LOW (ref 14–54)
ANION GAP: 9 (ref 5–15)
AST: 13 U/L — ABNORMAL LOW (ref 15–41)
Alkaline Phosphatase: 42 U/L (ref 38–126)
BUN: 23 mg/dL — AB (ref 6–20)
CALCIUM: 8.5 mg/dL — AB (ref 8.9–10.3)
CO2: 25 mmol/L (ref 22–32)
CREATININE: 1.5 mg/dL — AB (ref 0.44–1.00)
Chloride: 104 mmol/L (ref 101–111)
GFR calc non Af Amer: 36 mL/min — ABNORMAL LOW (ref >60)
GFR, EST AFRICAN AMERICAN: 41 mL/min — AB (ref >60)
Glucose, Bld: 105 mg/dL — ABNORMAL HIGH (ref 65–99)
POTASSIUM: 3 mmol/L — AB (ref 3.5–5.1)
Sodium: 138 mmol/L (ref 135–145)
Total Protein: 7 g/dL (ref 6.5–8.1)

## 2015-07-26 MED ORDER — SODIUM CHLORIDE 0.9 % IV BOLUS (SEPSIS)
1000.0000 mL | Freq: Once | INTRAVENOUS | Status: AC
  Administered 2015-07-26: 1000 mL via INTRAVENOUS

## 2015-07-26 NOTE — ED Provider Notes (Signed)
[facility] Emergency Department Provider Note  Time seen: 10:24 AM  I have reviewed the triage vital signs and the nursing notes.   HISTORY  Chief Complaint Diarrhea    HPI [name] is a 65 y/o female with a past medical history of hypertension, gastric reflux, anemia, CK D, who presents to the emergency department with diarrhea. According to the patient for the past 3 weeks she has been having significant diarrhea. She has been followed at [facility]. Patient was diagnosed with C. difficile any urinary tract infection. She has been taking vancomycin now for 3 weeks, states over the past 24 hours the diarrhea has increased, she states at least 5 episodes over the past 24 hours. Denies any abdominal pain. Patient states she has been taken her vancomycin as prescribed. Denies any fever. States she came to the ER because of the increased diarrhea.    Past Medical History  Diagnosis Date  . Cancer ([place])   . Hypertension   . GERD (gastroesophageal reflux disease)   . Renal disorder   . Anemia     Patient Active Problem List   Diagnosis Date Noted  . 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 Essential hypertension 07/06/2014  . H/O kidney transplant 07/06/2014    Past Surgical History  Procedure Laterality Date  . Kidney transplant    . Abdominal hysterectomy    . Nephrectomy transplanted organ    . Av fistula placement      Current Outpatient Rx  Name  Route  Sig  Dispense  Refill  . atorvastatin (LIPITOR) 40 MG tablet   Oral   Take 40 mg by mouth at bedtime.         . calcitRIOL (ROCALTROL) 0.25 MCG capsule   Oral   Take 0.25 mcg by mouth daily.         . citalopram (CELEXA) 20 MG tablet   Oral   Take 20 mg by mouth daily.         . folic acid (FOLVITE) 1 MG tablet   Oral   Take 1 mg by mouth 2 (two) times daily.          . furosemide (LASIX) 20 MG tablet   Oral   Take 20 mg by mouth daily.         Marland Kitchen  HYDROcodone-acetaminophen (NORCO/VICODIN) 5-325 MG tablet   Oral   Take 1 tablet by mouth every 6 (six) hours as needed for moderate pain.   20 tablet   0   . losartan (COZAAR) 25 MG tablet   Oral   Take 25 mg by mouth daily.         . metoprolol succinate (TOPROL-XL) 25 MG 24 hr tablet   Oral   Take 25 mg by mouth daily.         . mycophenolate (MYFORTIC) 180 MG EC tablet   Oral   Take 180 mg by mouth 2 (two) times daily.         . Potassium 99 MG TABS   Oral   Take 99 mg by mouth 2 (two) times daily.         . predniSONE (DELTASONE) 5 MG tablet   Oral   Take 5 mg by mouth daily with breakfast.          . sirolimus (RAPAMUNE) 1 MG tablet   Oral   Take 6 mg by mouth daily.           Allergies Lisinopril  No family history on file.  Social History Social History  Substance Use Topics  . Smoking status: Former Research scientist (life sciences)  . Smokeless tobacco: Never Used  . Alcohol Use: No    Review of Systems Constitutional: Negative for fever. Cardiovascular: Negative for chest pain. Respiratory: Negative for shortness of breath. Gastrointestinal: Negative for abdominal pain. Positive for diarrhea. Musculoskeletal: Negative for back pain. Neurological: Negative for headache 10-point ROS otherwise negative.  ____________________________________________   PHYSICAL EXAM:  VITAL SIGNS: ED Triage Vitals  Enc Vitals Group     BP 07/26/15 0942 114/66 mmHg     Pulse Rate 07/26/15 0942 74     Resp 07/26/15 0942 18     Temp 07/26/15 0942 98.4 F (36.9 C)     Temp Source 07/26/15 0942 Oral     SpO2 07/26/15 0942 97 %     Weight 07/26/15 0942 196 lb (88.905 kg)     Height 07/26/15 0942 4\' 11"  (1.499 m)     Head Cir --      Peak Flow --      Pain Score 07/26/15 0942 0     Pain Loc --      Pain Edu? --      Excl. in [place]? --     Constitutional: Alert and oriented. Well appearing and in no distress. Eyes: Normal exam ENT   Head: Normocephalic and  atraumatic.   Mouth/Throat: Mucous membranes are moist. Cardiovascular: Normal rate, regular rhythm. No murmur Respiratory: Normal respiratory effort without tachypnea nor retractions. Breath sounds are clear Gastrointestinal: Soft and nontender. No distention. Musculoskeletal: Nontender with normal range of motion in all extremities.  Neurologic:  Normal speech and language. No gross focal neurologic deficits Skin:  Skin is warm, dry and intact.  Psychiatric: Mood and affect are normal. Speech and behavior are normal.   ____________________________________________   INITIAL IMPRESSION / ASSESSMENT AND PLAN / ED COURSE  Pertinent labs & imaging results that were available during my care of the patient were reviewed by me and considered in my medical decision making (see chart for details).  The patient presents the emergency department with diarrhea increasing over the past 1-2 days. Patient has had diarrhea for the past 3 weeks being treated with vancomycin. Patient states initial improvement but now the diarrhea has once again increased greater than 5 episodes of the past 24 hours. Patient states also she was diagnosed with a recent urinary tract infection. We will check urinalysis, check labs and close to monitor the patient in the emergency department. We'll IV hydrate. Overall the patient appears well, no distress with normal vitals.  Patient's labs are largely at her baseline/within normal limits. White blood cell count is normal. Urinalysis is equivocal for urinary tract infection, appears much improved from prior urinalysis. We will not start in about X at this time as the patient is currently taking oral vancomycin as well as oral Augmentin. I sent a urine culture. Patient has not been able to provide a stool stable in the emergency department states she has not had nursed have a bowel movement. Patient has been in the emergency department over 5 hours at this point. I discussed with  patient given normal labs, without very frequent bowel movements to continue the oral vancomycin, and follow up with her primary care doctor. Patient and her husband are agreeable to this plan.  ____________________________________________   FINAL CLINICAL IMPRESSION(S) / ED DIAGNOSES  C. difficile diarrhea   Harvest Dark, MD 07/26/15 1428

## 2015-07-26 NOTE — ED Notes (Addendum)
Patient presents to the ED with diarrhea over 3 weeks.  Patient's doctor prescribed a medication for patient's diarrhea but medication has not improved diarrhea.  Patient's husband states that the doctor told her she has C-diff and a urinary tract infection.  Patient reports about 5 episodes of diarrhea in the past 24 hours.  Patient denies that the doctor prescribed any antibiotics.  Patient is in no obvious distress at this time.  When this RN looked at patient's pills, the medication patient thought was to improve diarrhea was vancomycin.  Patient educated on the importance of taking antibiotics as prescribed.  Patient states, "I've been taking them four times a day like they said."

## 2015-07-26 NOTE — ED Notes (Signed)
Patient unable to give urine or stool sample at this time. Patient denies having any bowel movements while being here.

## 2015-07-26 NOTE — Discharge Instructions (Signed)
Clostridium Difficile Infection Clostridium difficile (C. difficile or C. diff) is a germ found in the intestines. C. difficile infection can happen after taking antibiotic medicines. Antiobiotics may cause the C. difficile to grow out of control and make a toxin that causes the infection. C. difficile infection can cause watery poop (diarrhea) or severe disease. HOME CARE  Drink enough fluids to keep your pee (urine) clear or pale yellow. Avoid milk, caffeine, and alcohol.  Ask your doctor how to replace the body fluids you lost (rehydrate).  Eat small meals often. Avoid eating large meals.  Take your antibiotics as told. Finish them even if you start to feel better.  Do not  take medicines that help watery poop. These medicines may stop you from healing as fast or cause problems.  Wash your hands after using the bathroom and before touching food. Make sure people who live with you wash their hands often.  Clean all surfaces in your home. Use a product that has chlorine bleach in it. GET HELP IF:  Your watery poop lasts longer than expected.  Your watery poop comes back after you finish your antibiotic medicine.  You feel very dry or thirsty (dehydrated).  You have a fever. GET HELP RIGHT AWAY IF:   You have more stomach pain or tenderness.  You have blood in your poop (stool). Your poop may look black and tar-like.  You cannot eat or drink without throwing up (vomiting).   This information is not intended to replace advice given to you by your health care provider. Make sure you discuss any questions you have with your health care provider.   Document Released: 11/28/2008 Document Revised: 02/21/2014 Document Reviewed: 08/04/2014 Elsevier Interactive Patient Education 2016 [place] What is Clostridium difficile infection?  Clostridium difficile [pronounced Klo-STRID-ee-um dif-uh-SEEL], also known as "C. diff" [See-dif], is a germ that can cause diarrhea. Most  cases of C. diff infection occur in patients taking antibiotics. The most common symptoms of a C. diff infection include:  Watery diarrhea  Fever  Loss of appetite  Nausea  Belly pain and tenderness Who is most likely to get C. diff infection? The elderly and people with certain medical problems have the greatest chance of getting C. diff. C. diff spores can live outside the human body for a very long time and may be found on things in the environment such as bed linens, bed rails, bathroom fixtures, and medical equipment. C. diff infection can spread from person-to-person on contaminated equipment and on the hands of doctors, nurses, other healthcare providers and visitors. Can C. diff infection be treated? Yes, there are antibiotics that can be used to treat C. diff. In some severe cases, a person might have to have surgery to remove the infected part of the intestines. This surgery is needed in only 1 or 2 out of every 100 persons with C. diff. What are some of the things that hospitals are doing to prevent C. diff infections? To prevent C. diff infections, doctors, nurses, and other healthcare providers:  Clean their hands with soap and water or an alcohol-based hand rub before and after caring for every patient. This can prevent C. diff and other germs from being passed from one patient to another on their hands.  Carefully clean hospital rooms and medical equipment that have been used for patients with C. diff.  Use Contact Precautions to prevent C. diff from spreading to other patients. Contact Precautions mean:  Whenever possible, patients with  C. diff will have a single room or share a room only with someone else who also has C. diff.  Healthcare providers will put on gloves and wear a gown over their clothing while taking care of patients with C. diff.  Visitors may also be asked to wear a gown and gloves.  When leaving the room, hospital providers and visitors remove their  gown and gloves and clean their hands.  Patients on Contact Precautions are asked to stay in their hospital rooms as much as possible. They should not go to common areas, such as the gift shop or cafeteria. They can go to other areas of the hospital for treatments and tests.  Only give patients antibiotics when it is necessary. What can I do to help prevent C. diff infections?  Make sure that all doctors, nurses, and other healthcare providers clean their hands with soap and water or an alcohol-based hand rub before and after caring for you.  If you do not see your providers clean their hands, please ask them to do so.  Only take antibiotics as prescribed by your doctor.  Be sure to clean your own hands often, especially after using the bathroom and before eating. Can my friends and family get C. diff when they visit me? C. diff infection usually does not occur in persons who are not taking antibiotics. Visitors are not likely to get C. diff. Still, to make it safer for visitors, they should:  Clean their hands before they enter your room and as they leave your room  Ask the nurse if they need to wear protective gowns and gloves when they visit you. What do I need to do when I go home from the hospital? Once you are back at home, you can return to your normal routine. Often, the diarrhea will be better or completely gone before you go home. This makes giving C. diff to other people much less likely. There are a few things you should do, however, to lower the chances of developing C. diff infection again or of spreading it to others.  If you are given a prescription to treat C. diff, take the medicine exactly as prescribed by your doctor and pharmacist. Do not take half-doses or stop before you run out.  Wash your hands often, especially after going to the bathroom and before preparing food.  People who live with you should wash their hands often as well.  If you develop more diarrhea  after you get home, tell your doctor immediately.  Your doctor may give you additional instructions. If you have questions, please ask your doctor or nurse. Developed and co-sponsored by [employer] for [place] [phone]); Infectious Diseases Society of [place] (IDSA); [place]; Association for Professionals in Infection Control and Epidemiology (APIC); Centers for Disease Control and Prevention (CDC); and [employer].   This information is not intended to replace advice given to you by your health care provider. Make sure you discuss any questions you have with your health care provider.   Document Released: 02/05/2013 Document Revised: 06/17/2014 Document Reviewed: 04/16/2014 Elsevier Interactive Patient Education [employer].

## 2015-08-07 ENCOUNTER — Other Ambulatory Visit: Payer: Self-pay | Admitting: Preventative Medicine

## 2015-08-07 ENCOUNTER — Ambulatory Visit
Admission: RE | Admit: 2015-08-07 | Discharge: 2015-08-07 | Disposition: A | Discharge status: Home / Self Care | Payer: Medicare Other | Source: Ambulatory Visit | Attending: Preventative Medicine | Admitting: Preventative Medicine

## 2015-08-07 DIAGNOSIS — R921 Mammographic calcification found on diagnostic imaging of breast: Secondary | ICD-10-CM | POA: Diagnosis not present

## 2015-08-07 DIAGNOSIS — Z1231 Encounter for screening mammogram for malignant neoplasm of breast: Secondary | ICD-10-CM | POA: Diagnosis present

## 2015-08-21 ENCOUNTER — Emergency Department
Admission: EM | Admit: 2015-08-21 | Discharge: 2015-08-21 | Disposition: A | Discharge status: Home / Self Care | Payer: Medicare Other | Attending: Student | Admitting: Student

## 2015-08-21 ENCOUNTER — Encounter: Payer: Self-pay | Admitting: Emergency Medicine

## 2015-08-21 DIAGNOSIS — I10 Essential (primary) hypertension: Secondary | ICD-10-CM | POA: Diagnosis not present

## 2015-08-21 DIAGNOSIS — N390 Urinary tract infection, site not specified: Secondary | ICD-10-CM

## 2015-08-21 DIAGNOSIS — Z79899 Other long term (current) drug therapy: Secondary | ICD-10-CM | POA: Diagnosis not present

## 2015-08-21 DIAGNOSIS — R300 Dysuria: Secondary | ICD-10-CM | POA: Diagnosis present

## 2015-08-21 DIAGNOSIS — Z87891 Personal history of nicotine dependence: Secondary | ICD-10-CM | POA: Insufficient documentation

## 2015-08-21 LAB — URINALYSIS COMPLETE WITH MICROSCOPIC (ARMC ONLY)
BILIRUBIN URINE: NEGATIVE
Glucose, UA: NEGATIVE mg/dL
Ketones, ur: NEGATIVE mg/dL
Nitrite: NEGATIVE
PH: 6 (ref 5.0–8.0)
PROTEIN: 30 mg/dL — AB
Specific Gravity, Urine: 1.01 (ref 1.005–1.030)

## 2015-08-21 MED ORDER — SULFAMETHOXAZOLE-TRIMETHOPRIM 800-160 MG PO TABS: 1.0000 | ORAL_TABLET | Freq: Two times a day (BID) | ORAL | Status: DC

## 2015-08-21 MED ORDER — PHENAZOPYRIDINE HCL 200 MG PO TABS: 200.0000 mg | ORAL_TABLET | Freq: Three times a day (TID) | ORAL | Status: DC | PRN

## 2015-08-21 NOTE — Discharge Instructions (Signed)

## 2015-08-21 NOTE — ED Provider Notes (Signed)
[facility] Emergency Department Provider Note  ____________________________________________  Time seen: Approximately 10:14 AM  I have reviewed the triage vital signs and the nursing notes.   HISTORY  Chief Complaint Dysuria    HPI [name] is a 65 y.o. female presents for evaluation of dysuria times last couple days. Denies any fever chills nausea vomiting. States it hurts to go to the bathroom. Denies any back pain. Describes pain as minimal and more uncomfortable illness.   Past Medical History  Diagnosis Date  . Cancer ([place])   . Hypertension   . GERD (gastroesophageal reflux disease)   . Renal disorder   . Anemia     Patient Active Problem List   Diagnosis Date Noted  . 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 Essential hypertension 07/06/2014  . H/O kidney transplant 07/06/2014    Past Surgical History  Procedure Laterality Date  . Kidney transplant    . Abdominal hysterectomy    . Nephrectomy transplanted organ    . Av fistula placement    . Breast biopsy      Patient is unsure but she thinks she remember a Bx    Current Outpatient Rx  Name  Route  Sig  Dispense  Refill  . atorvastatin (LIPITOR) 40 MG tablet   Oral   Take 40 mg by mouth at bedtime.         . calcitRIOL (ROCALTROL) 0.25 MCG capsule   Oral   Take 0.25 mcg by mouth daily.         . citalopram (CELEXA) 20 MG tablet   Oral   Take 20 mg by mouth daily.         . folic acid (FOLVITE) 1 MG tablet   Oral   Take 1 mg by mouth 2 (two) times daily.          . furosemide (LASIX) 20 MG tablet   Oral   Take 20 mg by mouth daily.         Marland Kitchen HYDROcodone-acetaminophen (NORCO/VICODIN) 5-325 MG tablet   Oral   Take 1 tablet by mouth every 6 (six) hours as needed for moderate pain.   20 tablet   0   . losartan (COZAAR) 25 MG tablet   Oral   Take 25 mg by mouth daily.         . metoprolol succinate (TOPROL-XL) 25 MG 24 hr tablet   Oral   Take 25  mg by mouth daily.         . mycophenolate (MYFORTIC) 180 MG EC tablet   Oral   Take 180 mg by mouth 2 (two) times daily.         . phenazopyridine (PYRIDIUM) 200 MG tablet   Oral   Take 1 tablet (200 mg total) by mouth 3 (three) times daily as needed for pain.   20 tablet   0   . Potassium 99 MG TABS   Oral   Take 99 mg by mouth 2 (two) times daily.         . predniSONE (DELTASONE) 5 MG tablet   Oral   Take 5 mg by mouth daily with breakfast.          . sirolimus (RAPAMUNE) 1 MG tablet   Oral   Take 6 mg by mouth daily.         Marland Kitchen sulfamethoxazole-trimethoprim (BACTRIM DS,SEPTRA DS) 800-160 MG tablet   Oral   Take 1 tablet by mouth 2 (two) times  daily.   20 tablet   0     Allergies Lisinopril  Family History  Problem Relation Age of Onset  . Breast cancer Neg Hx     Social History Social History  Substance Use Topics  . Smoking status: Former Research scientist (life sciences)  . Smokeless tobacco: Never Used  . Alcohol Use: No    Review of Systems Constitutional: No fever/chills Cardiovascular: Denies chest pain. Respiratory: Denies shortness of breath. Gastrointestinal: No abdominal pain.  No nausea, no vomiting.  No diarrhea.  No constipation. Genitourinary: Positive for dysuria Musculoskeletal: Negative for back pain. Skin: Negative for rash. Neurological: Negative for headaches, focal weakness or numbness.  10-point ROS otherwise negative.  ____________________________________________   PHYSICAL EXAM:  VITAL SIGNS: ED Triage Vitals  Enc Vitals Group     BP 08/21/15 1001 125/68 mmHg     Pulse Rate 08/21/15 1001 65     Resp 08/21/15 1001 18     Temp 08/21/15 1001 98.2 F (36.8 C)     Temp Source 08/21/15 1001 Oral     SpO2 08/21/15 1001 97 %     Weight 08/21/15 1001 200 lb (90.719 kg)     Height 08/21/15 1001 4\' 11"  (1.499 m)     Head Cir --      Peak Flow --      Pain Score 08/21/15 1000 1     Pain Loc --      Pain Edu? --      Excl. in [place]? --      Constitutional: Alert and oriented. Well appearing and in no acute distress. Cardiovascular: Normal rate, regular rhythm. Grossly normal heart sounds.  Good peripheral circulation. Respiratory: Normal respiratory effort.  No retractions. Lungs CTAB. Gastrointestinal: Soft and nontender. No distention. No abdominal bruits. No CVA tenderness. Musculoskeletal: No lower extremity tenderness nor edema.  No joint effusions. Neurologic:  Normal speech and language. No gross focal neurologic deficits are appreciated. No gait instability. Skin:  Skin is warm, dry and intact. No rash noted. Psychiatric: Mood and affect are normal. Speech and behavior are normal.  ____________________________________________   LABS (all labs ordered are listed, but only abnormal results are displayed)  Labs Reviewed  URINALYSIS COMPLETEWITH MICROSCOPIC ([place] ONLY) - Abnormal; Notable for the following:    Color, Urine YELLOW (*)    APPearance CLOUDY (*)    Hgb urine dipstick 1+ (*)    Protein, ur 30 (*)    Leukocytes, UA 3+ (*)    Bacteria, UA RARE (*)    Squamous Epithelial / LPF 0-5 (*)    All other components within normal limits     PROCEDURES  Procedure(s) performed: None  Critical Care performed: No  ____________________________________________   INITIAL IMPRESSION / ASSESSMENT AND PLAN / ED COURSE  Pertinent labs & imaging results that were available during my care of the patient were reviewed by me and considered in my medical decision making (see chart for details).  Acute urinary tract infection. Rx given for Bactrim DS twice a day 20 and Pyridium 200 mg 3 times a day #6. Patient follow-up with PCP or return to ER with any worsening symptomology. ____________________________________________   FINAL CLINICAL IMPRESSION(S) / ED DIAGNOSES  Final diagnoses:  Urinary tract infection without hematuria, site unspecified     This chart was dictated using voice recognition  software/Dragon. Despite best efforts to proofread, errors can occur which can change the meaning. Any change was purely unintentional.   Arlyss Repress, PA-C 08/21/15 1116  Joanne Gavel, MD 08/21/15 [phone]

## 2015-08-21 NOTE — ED Notes (Signed)
Per family she developed some dysuria 2 days ago  denies any fever or abd pain

## 2015-08-21 NOTE — ED Notes (Signed)
C/o burning with urination x 2 days.  Denies abd pain.

## 2015-09-04 ENCOUNTER — Other Ambulatory Visit: Payer: Self-pay | Admitting: Preventative Medicine

## 2015-09-04 DIAGNOSIS — R921 Mammographic calcification found on diagnostic imaging of breast: Secondary | ICD-10-CM

## 2015-09-16 ENCOUNTER — Ambulatory Visit
Admission: RE | Admit: 2015-09-16 | Discharge: 2015-09-16 | Disposition: A | Discharge status: Home / Self Care | Payer: Medicare Other | Source: Ambulatory Visit | Attending: Preventative Medicine | Admitting: Preventative Medicine

## 2015-09-16 DIAGNOSIS — R921 Mammographic calcification found on diagnostic imaging of breast: Secondary | ICD-10-CM

## 2015-09-18 ENCOUNTER — Other Ambulatory Visit: Payer: Self-pay | Admitting: Preventative Medicine

## 2015-09-18 DIAGNOSIS — R921 Mammographic calcification found on diagnostic imaging of breast: Secondary | ICD-10-CM

## 2015-09-25 ENCOUNTER — Ambulatory Visit
Admission: RE | Admit: 2015-09-25 | Discharge: 2015-09-25 | Disposition: A | Discharge status: Home / Self Care | Payer: Medicare Other | Source: Ambulatory Visit | Attending: Preventative Medicine | Admitting: Preventative Medicine

## 2015-09-25 DIAGNOSIS — R921 Mammographic calcification found on diagnostic imaging of breast: Secondary | ICD-10-CM | POA: Insufficient documentation

## 2015-09-28 LAB — SURGICAL PATHOLOGY

## 2015-12-02 ENCOUNTER — Other Ambulatory Visit: Payer: Self-pay | Admitting: Family Medicine

## 2015-12-02 DIAGNOSIS — Z13820 Encounter for screening for osteoporosis: Secondary | ICD-10-CM

## 2016-01-14 ENCOUNTER — Ambulatory Visit
Admission: RE | Admit: 2016-01-14 | Discharge: 2016-01-14 | Disposition: A | Discharge status: Home / Self Care | Payer: Medicare Other | Source: Ambulatory Visit | Attending: Family Medicine | Admitting: Family Medicine

## 2016-01-14 DIAGNOSIS — Z13820 Encounter for screening for osteoporosis: Secondary | ICD-10-CM

## 2016-01-14 DIAGNOSIS — M85851 Other specified disorders of bone density and structure, right thigh: Secondary | ICD-10-CM | POA: Insufficient documentation

## 2016-01-14 DIAGNOSIS — Z780 Asymptomatic menopausal state: Secondary | ICD-10-CM | POA: Insufficient documentation

## 2016-01-14 DIAGNOSIS — M8588 Other specified disorders of bone density and structure, other site: Secondary | ICD-10-CM | POA: Diagnosis not present

## 2016-06-15 ENCOUNTER — Emergency Department
Admission: EM | Admit: 2016-06-15 | Discharge: 2016-06-15 | Disposition: A | Discharge status: Home / Self Care | Payer: Medicare Other | Attending: Emergency Medicine | Admitting: Emergency Medicine

## 2016-06-15 ENCOUNTER — Encounter: Payer: Self-pay | Admitting: Emergency Medicine

## 2016-06-15 DIAGNOSIS — Z79899 Other long term (current) drug therapy: Secondary | ICD-10-CM | POA: Insufficient documentation

## 2016-06-15 DIAGNOSIS — M79641 Pain in right hand: Secondary | ICD-10-CM

## 2016-06-15 DIAGNOSIS — Z859 Personal history of malignant neoplasm, unspecified: Secondary | ICD-10-CM | POA: Insufficient documentation

## 2016-06-15 DIAGNOSIS — Z87891 Personal history of nicotine dependence: Secondary | ICD-10-CM | POA: Diagnosis not present

## 2016-06-15 DIAGNOSIS — I10 Essential (primary) hypertension: Secondary | ICD-10-CM | POA: Insufficient documentation

## 2016-06-15 LAB — CBC WITH DIFFERENTIAL/PLATELET
Basophils Absolute: 0.1 10*3/uL (ref 0–0.1)
Basophils Relative: 1 %
Eosinophils Absolute: 0 10*3/uL (ref 0–0.7)
Eosinophils Relative: 0 %
HCT: 32.8 % — ABNORMAL LOW (ref 35.0–47.0)
Hemoglobin: 10.6 g/dL — ABNORMAL LOW (ref 12.0–16.0)
LYMPHS ABS: 0.6 10*3/uL — AB (ref 1.0–3.6)
LYMPHS PCT: 12 %
MCH: 24.8 pg — ABNORMAL LOW (ref 26.0–34.0)
MCHC: 32.3 g/dL (ref 32.0–36.0)
MCV: 76.8 fL — AB (ref 80.0–100.0)
MONO ABS: 0.9 10*3/uL (ref 0.2–0.9)
MONOS PCT: 17 %
NEUTROS ABS: 3.7 10*3/uL (ref 1.4–6.5)
NEUTROS PCT: 70 %
PLATELETS: 173 10*3/uL (ref 150–440)
RBC: 4.27 MIL/uL (ref 3.80–5.20)
RDW: 14.8 % — AB (ref 11.5–14.5)
WBC: 5.2 10*3/uL (ref 3.6–11.0)

## 2016-06-15 LAB — BASIC METABOLIC PANEL
ANION GAP: 10 (ref 5–15)
BUN: 29 mg/dL — ABNORMAL HIGH (ref 6–20)
CALCIUM: 8 mg/dL — AB (ref 8.9–10.3)
CO2: 27 mmol/L (ref 22–32)
Chloride: 102 mmol/L (ref 101–111)
Creatinine, Ser: 1.81 mg/dL — ABNORMAL HIGH (ref 0.44–1.00)
GFR, EST AFRICAN AMERICAN: 33 mL/min — AB (ref >60)
GFR, EST NON AFRICAN AMERICAN: 28 mL/min — AB (ref >60)
GLUCOSE: 120 mg/dL — AB (ref 65–99)
POTASSIUM: 3.3 mmol/L — AB (ref 3.5–5.1)
Sodium: 139 mmol/L (ref 135–145)

## 2016-06-15 LAB — MAGNESIUM: MAGNESIUM: 2.3 mg/dL (ref 1.7–2.4)

## 2016-06-15 MED ORDER — POTASSIUM CHLORIDE CRYS ER 20 MEQ PO TBCR
20.0000 meq | EXTENDED_RELEASE_TABLET | Freq: Once | ORAL | Status: AC
  Administered 2016-06-15: 20 meq via ORAL
  Filled 2016-06-15: qty 1

## 2016-06-15 MED ORDER — SODIUM CHLORIDE 0.9 % IV SOLN
1.0000 g | Freq: Once | INTRAVENOUS | Status: AC
  Administered 2016-06-15: 1 g via INTRAVENOUS
  Filled 2016-06-15: qty 10

## 2016-06-15 MED ORDER — SODIUM CHLORIDE 0.9 % IV BOLUS (SEPSIS)
500.0000 mL | Freq: Once | INTRAVENOUS | Status: AC
  Administered 2016-06-15: 500 mL via INTRAVENOUS

## 2016-06-15 NOTE — ED Notes (Signed)
[name] pt's daughter  # [phone] Call if anything changes

## 2016-06-15 NOTE — ED Provider Notes (Signed)
[facility] Emergency Department Provider Note  ____________________________________________   I have reviewed the triage vital signs and the nursing notes.   HISTORY  Chief Complaint Hand Pain   History limited by: Not Limited   HPI [name] is a 66 y.o. female who presents to the emergency department today because of concerns for an episode of hand pain. The patient states that occurred earlier today. Came on all of a sudden. She describes the pain as being located over her palm. She also felt like her hand was cramping at that time. The symptoms lasted for a few minutes before resolving on their own. Patient states that at the same time she also had some face cramping. She denies any change in sensation or numbness to her face. Denies any change in vision. She states that this had resolved by the time of my examination. She denies any recent illness. No recent fevers, headache, chest pain or shortness of breath.    Past Medical History:  Diagnosis Date  . Anemia   . Cancer ([place])   . GERD (gastroesophageal reflux disease)   . Hypertension   . Renal disorder     Patient Active Problem List   Diagnosis Date Noted  . 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 Essential hypertension 07/06/2014  . H/O kidney transplant 07/06/2014    Past Surgical History:  Procedure Laterality Date  . ABDOMINAL HYSTERECTOMY    . AV FISTULA PLACEMENT    . BREAST BIOPSY     Patient is unsure but she thinks she remember a Bx  . KIDNEY TRANSPLANT    . NEPHRECTOMY TRANSPLANTED ORGAN      Prior to Admission medications   Medication Sig Start Date End Date Taking? Authorizing Provider  atorvastatin (LIPITOR) 40 MG tablet Take 40 mg by mouth at bedtime.    Historical Provider, MD  calcitRIOL (ROCALTROL) 0.25 MCG capsule Take 0.25 mcg by mouth daily.    Historical Provider, MD  citalopram (CELEXA) 20 MG tablet Take 20 mg by mouth daily.    Historical Provider, MD   folic acid (FOLVITE) 1 MG tablet Take 1 mg by mouth 2 (two) times daily.     Historical Provider, MD  furosemide (LASIX) 20 MG tablet Take 20 mg by mouth daily.    Historical Provider, MD  HYDROcodone-acetaminophen (NORCO/VICODIN) 5-325 MG tablet Take 1 tablet by mouth every 6 (six) hours as needed for moderate pain. 02/01/15   Johnn Hai, PA-C  losartan (COZAAR) 25 MG tablet Take 25 mg by mouth daily.    Historical Provider, MD  metoprolol succinate (TOPROL-XL) 25 MG 24 hr tablet Take 25 mg by mouth daily.    Historical Provider, MD  mycophenolate (MYFORTIC) 180 MG EC tablet Take 180 mg by mouth 2 (two) times daily.    Historical Provider, MD  phenazopyridine (PYRIDIUM) 200 MG tablet Take 1 tablet (200 mg total) by mouth 3 (three) times daily as needed for pain. 08/21/15 08/20/16  Pierce Crane Beers, PA-C  Potassium 99 MG TABS Take 99 mg by mouth 2 (two) times daily.    Historical Provider, MD  predniSONE (DELTASONE) 5 MG tablet Take 5 mg by mouth daily with breakfast.     Historical Provider, MD  sirolimus (RAPAMUNE) 1 MG tablet Take 6 mg by mouth daily.    Historical Provider, MD  sulfamethoxazole-trimethoprim (BACTRIM DS,SEPTRA DS) 800-160 MG tablet Take 1 tablet by mouth 2 (two) times daily. 08/21/15   Arlyss Repress, PA-C  Allergies Lisinopril  Family History  Problem Relation Age of Onset  . Breast cancer Neg Hx     Social History Social History  Substance Use Topics  . Smoking status: Former Research scientist (life sciences)  . Smokeless tobacco: Never Used  . Alcohol use No    Review of Systems Constitutional: No fever/chills Eyes: No visual changes. ENT: No sore throat. Cardiovascular: Denies chest pain. Respiratory: Denies shortness of breath. Gastrointestinal: No abdominal pain.  No nausea, no vomiting.  No diarrhea.   Genitourinary: Negative for dysuria. Musculoskeletal: Positive for right arm cramping and pain. Skin: Negative for rash. Neurological: Negative for headaches, focal weakness  or numbness.  ____________________________________________   PHYSICAL EXAM:  VITAL SIGNS: ED Triage Vitals  Enc Vitals Group     BP --      Pulse --      Resp --      Temp --      Temp src --      SpO2 --      Weight 06/15/16 1453 195 lb (88.5 kg)     Height 06/15/16 1453 4\' 11"  (1.499 m)     Head Circumference --      Peak Flow --      Pain Score 06/15/16 1452 5     Pain Loc --      Pain Edu? --      Excl. in [place]? --      Constitutional: Alert and oriented. Well appearing and in no distress. Eyes: Conjunctivae are normal. Normal extraocular movements. ENT   Head: Normocephalic and atraumatic.   Nose: No congestion/rhinnorhea.   Mouth/Throat: Mucous membranes are moist.   Neck: No stridor. Hematological/Lymphatic/Immunilogical: No cervical lymphadenopathy. Cardiovascular: Normal rate, regular rhythm.  No murmurs, rubs, or gallops.  Respiratory: Normal respiratory effort without tachypnea nor retractions. Breath sounds are clear and equal bilaterally. No wheezes/rales/rhonchi. Gastrointestinal: Soft and non tender. No rebound. No guarding.  Genitourinary: Deferred Musculoskeletal: Normal range of motion in all extremities. No lower extremity edema. Neurologic:  Normal speech and language. No gross focal neurologic deficits are appreciated.  Skin:  Skin is warm, dry and intact. No rash noted. Psychiatric: Mood and affect are normal. Speech and behavior are normal. Patient exhibits appropriate insight and judgment.  ____________________________________________    LABS (pertinent positives/negatives)  Labs Reviewed  CBC WITH DIFFERENTIAL/PLATELET - Abnormal; Notable for the following:       Result Value   Hemoglobin 10.6 (*)    HCT 32.8 (*)    MCV 76.8 (*)    MCH 24.8 (*)    RDW 14.8 (*)    Lymphs Abs 0.6 (*)    All other components within normal limits  BASIC METABOLIC PANEL - Abnormal; Notable for the following:    Potassium 3.3 (*)    Glucose,  Bld 120 (*)    BUN 29 (*)    Creatinine, Ser 1.81 (*)    Calcium 8.0 (*)    GFR calc non Af Amer 28 (*)    GFR calc Af Amer 33 (*)    All other components within normal limits  MAGNESIUM     ____________________________________________   EKG  None  ____________________________________________    RADIOLOGY  None  ____________________________________________   PROCEDURES  Procedures  ____________________________________________   INITIAL IMPRESSION / ASSESSMENT AND PLAN / ED COURSE  Pertinent labs & imaging results that were available during my care of the patient were reviewed by me and considered in my medical decision making (see chart for  details).  Patient presents to the emergency department today because of concerns for right hand pain and cramping as well as some facial cramping that has since resolved. Blood work does show that the patient is somewhat hypokalemic. Additionally the patient has a slightly elevated creatinine of her baseline. Will plan on giving IV fluids as well as some calcium and potassium for repletion. Given that the patient's symptoms have resolved I do feel she would be good pain candidate for further outpatient workup. Did discuss with patient importance of following up with her primary care physician. ____________________________________________   FINAL CLINICAL IMPRESSION(S) / ED DIAGNOSES  Final diagnoses:  Right hand pain     Note: This dictation was prepared with Dragon dictation. Any transcriptional errors that result from this process are unintentional     Nance Pear, MD 06/15/16 [phone]

## 2016-06-15 NOTE — ED Triage Notes (Signed)
States has had history of hand and leg cramping since kidney transplant approx 10 years ago, however states approx 1 hour ago got worse. Cam by EMS. Relieved by triage.

## 2016-06-15 NOTE — Discharge Instructions (Signed)
Please seek medical attention for any high fevers, chest pain, shortness of breath, change in behavior, persistent vomiting, bloody stool or any other new or concerning symptoms.  

## 2016-06-29 ENCOUNTER — Encounter: Payer: Self-pay | Admitting: Emergency Medicine

## 2016-06-29 ENCOUNTER — Emergency Department: Payer: Medicare Other

## 2016-06-29 ENCOUNTER — Inpatient Hospital Stay
Admission: EM | Admit: 2016-06-29 | Discharge: 2016-07-02 | DRG: 871 | Disposition: A | Discharge status: Home / Self Care | Payer: Medicare Other | Attending: Internal Medicine | Admitting: Internal Medicine

## 2016-06-29 DIAGNOSIS — Z823 Family history of stroke: Secondary | ICD-10-CM

## 2016-06-29 DIAGNOSIS — Z85528 Personal history of other malignant neoplasm of kidney: Secondary | ICD-10-CM | POA: Diagnosis not present

## 2016-06-29 DIAGNOSIS — Z9049 Acquired absence of other specified parts of digestive tract: Secondary | ICD-10-CM | POA: Diagnosis not present

## 2016-06-29 DIAGNOSIS — Z940 Kidney transplant status: Secondary | ICD-10-CM | POA: Diagnosis not present

## 2016-06-29 DIAGNOSIS — I313 Pericardial effusion (noninflammatory): Secondary | ICD-10-CM | POA: Diagnosis present

## 2016-06-29 DIAGNOSIS — R509 Fever, unspecified: Secondary | ICD-10-CM

## 2016-06-29 DIAGNOSIS — Z90710 Acquired absence of both cervix and uterus: Secondary | ICD-10-CM

## 2016-06-29 DIAGNOSIS — B961 Klebsiella pneumoniae [K. pneumoniae] as the cause of diseases classified elsewhere: Secondary | ICD-10-CM | POA: Diagnosis present

## 2016-06-29 DIAGNOSIS — N390 Urinary tract infection, site not specified: Secondary | ICD-10-CM | POA: Diagnosis present

## 2016-06-29 DIAGNOSIS — Z87440 Personal history of urinary (tract) infections: Secondary | ICD-10-CM

## 2016-06-29 DIAGNOSIS — K219 Gastro-esophageal reflux disease without esophagitis: Secondary | ICD-10-CM | POA: Diagnosis present

## 2016-06-29 DIAGNOSIS — G9341 Metabolic encephalopathy: Secondary | ICD-10-CM | POA: Diagnosis present

## 2016-06-29 DIAGNOSIS — I10 Essential (primary) hypertension: Secondary | ICD-10-CM | POA: Diagnosis present

## 2016-06-29 DIAGNOSIS — A4159 Other Gram-negative sepsis: Principal | ICD-10-CM | POA: Diagnosis present

## 2016-06-29 DIAGNOSIS — E782 Mixed hyperlipidemia: Secondary | ICD-10-CM | POA: Diagnosis present

## 2016-06-29 DIAGNOSIS — Z7952 Long term (current) use of systemic steroids: Secondary | ICD-10-CM

## 2016-06-29 DIAGNOSIS — Z905 Acquired absence of kidney: Secondary | ICD-10-CM | POA: Diagnosis not present

## 2016-06-29 DIAGNOSIS — Z79899 Other long term (current) drug therapy: Secondary | ICD-10-CM | POA: Diagnosis not present

## 2016-06-29 DIAGNOSIS — A419 Sepsis, unspecified organism: Secondary | ICD-10-CM | POA: Diagnosis present

## 2016-06-29 DIAGNOSIS — F0390 Unspecified dementia without behavioral disturbance: Secondary | ICD-10-CM | POA: Diagnosis present

## 2016-06-29 DIAGNOSIS — Z87891 Personal history of nicotine dependence: Secondary | ICD-10-CM

## 2016-06-29 HISTORY — DX: Unspecified dementia, unspecified severity, without behavioral disturbance, psychotic disturbance, mood disturbance, and anxiety: F03.90

## 2016-06-29 LAB — CBC WITH DIFFERENTIAL/PLATELET
BASOS ABS: 0.1 10*3/uL (ref 0–0.1)
Basophils Relative: 1 %
Eosinophils Absolute: 0 10*3/uL (ref 0–0.7)
Eosinophils Relative: 0 %
HEMATOCRIT: 31.4 % — AB (ref 35.0–47.0)
HEMOGLOBIN: 10.2 g/dL — AB (ref 12.0–16.0)
LYMPHS ABS: 0.9 10*3/uL — AB (ref 1.0–3.6)
LYMPHS PCT: 9 %
MCH: 25.2 pg — AB (ref 26.0–34.0)
MCHC: 32.5 g/dL (ref 32.0–36.0)
MCV: 77.4 fL — ABNORMAL LOW (ref 80.0–100.0)
Monocytes Absolute: 0.9 10*3/uL (ref 0.2–0.9)
Monocytes Relative: 8 %
NEUTROS ABS: 9 10*3/uL — AB (ref 1.4–6.5)
Neutrophils Relative %: 82 %
Platelets: 276 10*3/uL (ref 150–440)
RBC: 4.06 MIL/uL (ref 3.80–5.20)
RDW: 14.6 % — ABNORMAL HIGH (ref 11.5–14.5)
WBC: 10.9 10*3/uL (ref 3.6–11.0)

## 2016-06-29 LAB — URINALYSIS, COMPLETE (UACMP) WITH MICROSCOPIC
BILIRUBIN URINE: NEGATIVE
Glucose, UA: NEGATIVE mg/dL
Ketones, ur: NEGATIVE mg/dL
Nitrite: NEGATIVE
PH: 6 (ref 5.0–8.0)
Protein, ur: NEGATIVE mg/dL
SPECIFIC GRAVITY, URINE: 1.009 (ref 1.005–1.030)

## 2016-06-29 LAB — COMPREHENSIVE METABOLIC PANEL
ALT: 14 U/L (ref 14–54)
ANION GAP: 10 (ref 5–15)
AST: 18 U/L (ref 15–41)
Albumin: 3.2 g/dL — ABNORMAL LOW (ref 3.5–5.0)
Alkaline Phosphatase: 56 U/L (ref 38–126)
BUN: 21 mg/dL — AB (ref 6–20)
CO2: 28 mmol/L (ref 22–32)
Calcium: 8.3 mg/dL — ABNORMAL LOW (ref 8.9–10.3)
Chloride: 97 mmol/L — ABNORMAL LOW (ref 101–111)
Creatinine, Ser: 1.63 mg/dL — ABNORMAL HIGH (ref 0.44–1.00)
GFR calc Af Amer: 37 mL/min — ABNORMAL LOW (ref >60)
GFR, EST NON AFRICAN AMERICAN: 32 mL/min — AB (ref >60)
Glucose, Bld: 120 mg/dL — ABNORMAL HIGH (ref 65–99)
POTASSIUM: 3.4 mmol/L — AB (ref 3.5–5.1)
Sodium: 135 mmol/L (ref 135–145)
TOTAL PROTEIN: 7.7 g/dL (ref 6.5–8.1)
Total Bilirubin: 0.5 mg/dL (ref 0.3–1.2)

## 2016-06-29 LAB — LIPASE, BLOOD: LIPASE: 17 U/L (ref 11–51)

## 2016-06-29 LAB — TROPONIN I

## 2016-06-29 LAB — LACTIC ACID, PLASMA: Lactic Acid, Venous: 0.9 mmol/L (ref 0.5–1.9)

## 2016-06-29 MED ORDER — PIPERACILLIN-TAZOBACTAM 3.375 G IVPB
3.3750 g | Freq: Three times a day (TID) | INTRAVENOUS | Status: DC
Start: 2016-06-30 — End: 2016-06-30
  Administered 2016-06-30: 3.375 g via INTRAVENOUS
  Filled 2016-06-29 (×3): qty 50

## 2016-06-29 MED ORDER — DONEPEZIL HCL 5 MG PO TABS
5.0000 mg | ORAL_TABLET | Freq: Every day | ORAL | Status: DC
  Administered 2016-06-30 – 2016-07-02 (×3): 5 mg via ORAL
  Filled 2016-06-29 (×3): qty 1

## 2016-06-29 MED ORDER — ONDANSETRON HCL 4 MG/2ML IJ SOLN: 4.0000 mg | Freq: Four times a day (QID) | INTRAMUSCULAR | Status: DC | PRN

## 2016-06-29 MED ORDER — MYCOPHENOLATE SODIUM 180 MG PO TBEC
180.0000 mg | DELAYED_RELEASE_TABLET | Freq: Two times a day (BID) | ORAL | Status: DC
  Administered 2016-06-30 – 2016-07-02 (×6): 180 mg via ORAL
  Filled 2016-06-29 (×7): qty 1

## 2016-06-29 MED ORDER — VANCOMYCIN HCL IN DEXTROSE 1-5 GM/200ML-% IV SOLN
1000.0000 mg | Freq: Once | INTRAVENOUS | Status: AC
  Administered 2016-06-29: 1000 mg via INTRAVENOUS
  Filled 2016-06-29: qty 200

## 2016-06-29 MED ORDER — CITALOPRAM HYDROBROMIDE 20 MG PO TABS
20.0000 mg | ORAL_TABLET | Freq: Every day | ORAL | Status: DC
  Administered 2016-06-30 – 2016-07-02 (×3): 20 mg via ORAL
  Filled 2016-06-29 (×3): qty 1

## 2016-06-29 MED ORDER — ONDANSETRON HCL 4 MG/2ML IJ SOLN
INTRAMUSCULAR | Status: AC
  Administered 2016-06-29: 4 mg via INTRAVENOUS
  Filled 2016-06-29: qty 2

## 2016-06-29 MED ORDER — ONDANSETRON HCL 4 MG/2ML IJ SOLN
4.0000 mg | Freq: Once | INTRAMUSCULAR | Status: AC
  Administered 2016-06-29: 4 mg via INTRAVENOUS

## 2016-06-29 MED ORDER — METOPROLOL SUCCINATE ER 25 MG PO TB24
25.0000 mg | ORAL_TABLET | Freq: Every day | ORAL | Status: DC
  Administered 2016-06-30 – 2016-07-02 (×3): 25 mg via ORAL
  Filled 2016-06-29 (×3): qty 1

## 2016-06-29 MED ORDER — FUROSEMIDE 20 MG PO TABS: 20.0000 mg | ORAL_TABLET | Freq: Every day | ORAL | Status: DC

## 2016-06-29 MED ORDER — MORPHINE SULFATE (PF) 4 MG/ML IV SOLN
4.0000 mg | Freq: Once | INTRAVENOUS | Status: AC
  Administered 2016-06-29: 4 mg via INTRAVENOUS

## 2016-06-29 MED ORDER — HEPARIN SODIUM (PORCINE) 5000 UNIT/ML IJ SOLN
5000.0000 [IU] | Freq: Three times a day (TID) | INTRAMUSCULAR | Status: DC
  Administered 2016-06-30 – 2016-07-02 (×7): 5000 [IU] via SUBCUTANEOUS
  Filled 2016-06-29 (×7): qty 1

## 2016-06-29 MED ORDER — ACETAMINOPHEN 650 MG RE SUPP: 650.0000 mg | Freq: Four times a day (QID) | RECTAL | Status: DC | PRN

## 2016-06-29 MED ORDER — LOSARTAN POTASSIUM 25 MG PO TABS
25.0000 mg | ORAL_TABLET | Freq: Every day | ORAL | Status: DC
  Administered 2016-06-30 – 2016-07-02 (×3): 25 mg via ORAL
  Filled 2016-06-29 (×3): qty 1

## 2016-06-29 MED ORDER — ACETAMINOPHEN 325 MG PO TABS
650.0000 mg | ORAL_TABLET | Freq: Four times a day (QID) | ORAL | Status: DC | PRN
  Administered 2016-06-30: 650 mg via ORAL
  Filled 2016-06-29: qty 2

## 2016-06-29 MED ORDER — ONDANSETRON HCL 4 MG PO TABS: 4.0000 mg | ORAL_TABLET | Freq: Four times a day (QID) | ORAL | Status: DC | PRN

## 2016-06-29 MED ORDER — SIROLIMUS 1 MG PO TABS
3.0000 mg | ORAL_TABLET | Freq: Every day | ORAL | Status: DC
  Administered 2016-06-30 – 2016-07-02 (×3): 3 mg via ORAL
  Filled 2016-06-29 (×3): qty 3

## 2016-06-29 MED ORDER — VANCOMYCIN HCL IN DEXTROSE 1-5 GM/200ML-% IV SOLN
1000.0000 mg | INTRAVENOUS | Status: DC
  Filled 2016-06-29: qty 200

## 2016-06-29 MED ORDER — ATORVASTATIN CALCIUM 20 MG PO TABS
40.0000 mg | ORAL_TABLET | Freq: Every day | ORAL | Status: DC
Start: 2016-06-29 — End: 2016-07-02
  Administered 2016-06-30 – 2016-07-01 (×3): 40 mg via ORAL
  Filled 2016-06-29 (×3): qty 2

## 2016-06-29 MED ORDER — PIPERACILLIN-TAZOBACTAM 3.375 G IVPB 30 MIN
3.3750 g | Freq: Once | INTRAVENOUS | Status: AC
  Administered 2016-06-29: 3.375 g via INTRAVENOUS
  Filled 2016-06-29: qty 50

## 2016-06-29 MED ORDER — PREDNISONE 5 MG PO TABS
5.0000 mg | ORAL_TABLET | Freq: Every day | ORAL | Status: DC
  Administered 2016-06-30 – 2016-07-02 (×3): 5 mg via ORAL
  Filled 2016-06-29 (×3): qty 1

## 2016-06-29 MED ORDER — IOPAMIDOL (ISOVUE-300) INJECTION 61%
30.0000 mL | Freq: Once | INTRAVENOUS | Status: AC
  Administered 2016-06-29: 30 mL via ORAL

## 2016-06-29 MED ORDER — SODIUM CHLORIDE 0.9 % IV SOLN
INTRAVENOUS | Status: AC
  Administered 2016-06-30: via INTRAVENOUS

## 2016-06-29 MED ORDER — MORPHINE SULFATE (PF) 4 MG/ML IV SOLN
INTRAVENOUS | Status: AC
  Administered 2016-06-29: 4 mg via INTRAVENOUS
  Filled 2016-06-29: qty 1

## 2016-06-29 NOTE — H&P (Signed)
[facility] [place] NAME: [name]    MR#:  [id number]  DATE OF BIRTH:  11-Oct-1950  DATE OF ADMISSION:  06/29/2016  PRIMARY CARE PHYSICIAN: [facility] [place]   REQUESTING/REFERRING PHYSICIAN: Clearnce Hasten, MD  CHIEF COMPLAINT:   Chief Complaint  Patient presents with  . Abdominal Pain    HISTORY OF PRESENT ILLNESS:  [name]  is a 66 y.o. female who presents with Abdominal pain. Patient is demented and is unable to contribute much to her history of present illness, information was taken collaterally from her daughter. Her daughter is not present in the room when this Probation officer examined the patient. Per ED physician and her daughter stated that her mother began to have pain today in her abdomen, around the site of her renal transplant graft. She was brought to the ED for evaluation, here she was found to have UTI and sepsis criteria. Hospitalists were called for admission  PAST MEDICAL HISTORY:   Past Medical History:  Diagnosis Date  . Anemia   . Cancer ([place])   . Dementia   . GERD (gastroesophageal reflux disease)   . Hypertension   . Renal disorder     PAST SURGICAL HISTORY:   Past Surgical History:  Procedure Laterality Date  . ABDOMINAL HYSTERECTOMY    . AV FISTULA PLACEMENT    . BREAST BIOPSY     Patient is unsure but she thinks she remember a Bx  . KIDNEY TRANSPLANT    . NEPHRECTOMY TRANSPLANTED ORGAN      SOCIAL HISTORY:   Social History  Substance Use Topics  . Smoking status: Former Research scientist (life sciences)  . Smokeless tobacco: Never Used  . Alcohol use No    FAMILY HISTORY:   Family History  Problem Relation Age of Onset  . Stroke Mother   . Breast cancer Neg Hx     DRUG ALLERGIES:   Allergies  Allergen Reactions  . Ace Inhibitors Cough  . Lisinopril Cough    MEDICATIONS AT HOME:   Prior to Admission medications   Medication Sig Start Date End Date Taking? Authorizing Provider   atorvastatin (LIPITOR) 40 MG tablet Take 40 mg by mouth at bedtime.   Yes [provider]  calcitRIOL (ROCALTROL) 0.25 MCG capsule Take 0.25 mcg by mouth daily.   Yes [provider]  citalopram (CELEXA) 20 MG tablet Take 20 mg by mouth daily.   Yes [provider]  donepezil (ARICEPT) 5 MG tablet Take 5 mg by mouth daily. 04/05/16  Yes [provider]  folic acid (FOLVITE) 1 MG tablet Take 1 mg by mouth 2 (two) times daily.    Yes [provider]  furosemide (LASIX) 20 MG tablet Take 20 mg by mouth daily.   Yes [provider]  losartan (COZAAR) 25 MG tablet Take 25 mg by mouth daily.   Yes [provider]  mycophenolate (MYFORTIC) 180 MG EC tablet Take 180 mg by mouth 2 (two) times daily.   Yes [provider]  potassium chloride SA (K-DUR,KLOR-CON) 20 MEQ tablet Take 20 mEq by mouth daily. 05/13/16  Yes [provider]  predniSONE (DELTASONE) 5 MG tablet Take 5 mg by mouth daily with breakfast.    Yes [provider]  sirolimus (RAPAMUNE) 1 MG tablet Take 3 mg by mouth daily.    Yes [provider]  HYDROcodone-acetaminophen (NORCO/VICODIN) 5-325 MG tablet Take 1 tablet by mouth every 6 (six) hours as needed  for moderate pain. Patient not taking: Reported on 06/29/2016 02/01/15   Johnn Hai, PA-C  metoprolol succinate (TOPROL-XL) 25 MG 24 hr tablet Take 25 mg by mouth daily.    [provider]  phenazopyridine (PYRIDIUM) 200 MG tablet Take 1 tablet (200 mg total) by mouth 3 (three) times daily as needed for pain. Patient not taking: Reported on 06/29/2016 08/21/15 08/20/16  Beers, Pierce Crane, PA-C  sulfamethoxazole-trimethoprim (BACTRIM DS,SEPTRA DS) 800-160 MG tablet Take 1 tablet by mouth 2 (two) times daily. Patient not taking: Reported on 06/29/2016 08/21/15   Arlyss Repress, PA-C    REVIEW OF SYSTEMS:  Review of Systems  Unable to perform ROS: Dementia     VITAL SIGNS:    Vitals:   06/29/16 1903 06/29/16 2000 06/29/16 2030 06/29/16 2101  BP: (!) 182/84 (!) 133/93 129/77   Pulse:  77 78   Resp:  19 18   Temp:    99.7 F (37.6 C)  TempSrc:    Oral  SpO2:  97% 98%   Weight:       Wt Readings from Last 3 Encounters:  06/29/16 86.2 kg (190 lb)  06/15/16 88.5 kg (195 lb)  08/21/15 90.7 kg (200 lb)    PHYSICAL EXAMINATION:  Physical Exam  Vitals reviewed. Constitutional: She appears well-developed and well-nourished. No distress.  HENT:  Head: Normocephalic and atraumatic.  Mouth/Throat: Oropharynx is clear and moist.  Eyes: Conjunctivae and EOM are normal. Pupils are equal, round, and reactive to light. No scleral icterus.  Neck: Normal range of motion. Neck supple. No JVD present. No thyromegaly present.  Cardiovascular: Normal rate, regular rhythm and intact distal pulses.  Exam reveals no gallop and no friction rub.   No murmur heard. Respiratory: Effort normal and breath sounds normal. No respiratory distress. She has no wheezes. She has no rales.  GI: Soft. Bowel sounds are normal. She exhibits no distension. There is tenderness.  Musculoskeletal: Normal range of motion. She exhibits no edema.  No arthritis, no gout  Lymphadenopathy:    She has no cervical adenopathy.  Neurological: She is alert. No cranial nerve deficit.  Unable to fully assess due to dementia  Skin: Skin is warm and dry. No rash noted. No erythema.  Psychiatric:  Unable to assess due to dementia    LABORATORY PANEL:   CBC  Recent Labs Lab 06/29/16 1916  WBC 10.9  HGB 10.2*  HCT 31.4*  PLT 276   ------------------------------------------------------------------------------------------------------------------  Chemistries   Recent Labs Lab 06/29/16 1916  NA 135  K 3.4*  CL 97*  CO2 28  GLUCOSE 120*  BUN 21*  CREATININE 1.63*  CALCIUM 8.3*  AST 18  ALT 14  ALKPHOS 56  BILITOT 0.5    ------------------------------------------------------------------------------------------------------------------  Cardiac Enzymes  Recent Labs Lab 06/29/16 1916  TROPONINI <0.03   ------------------------------------------------------------------------------------------------------------------  RADIOLOGY:  Ct Abdomen Pelvis Wo Contrast  Result Date: 06/29/2016 CLINICAL DATA:  66 year old female with abdominal and pelvic pain with fever. History of kidney transplant. EXAM: CT ABDOMEN AND PELVIS WITHOUT CONTRAST TECHNIQUE: Multidetector CT imaging of the abdomen and pelvis was performed following the standard protocol without IV contrast. COMPARISON:  06/05/2015 FINDINGS: Please note that parenchymal abnormalities may be missed without intravenous contrast. Lower chest: A moderate pericardial effusion has slightly increased since 2017. Cardiomegaly again noted. Hepatobiliary: The liver is unremarkable. The patient is status post cholecystectomy. Mild intrahepatic and CBD dilatation noted to the ampulla without definite obstructing cause. Pancreas: Unremarkable Spleen: Unremarkable Adrenals/Urinary Tract:  A right pelvic renal transplant identified and has a similar appearance to 2017. Atrophic native kidneys noted. The adrenal glands and bladder are unremarkable. Stomach/Bowel: A definite bowel abnormality identified. No definite bowel wall thickening or bowel dilatation. Vascular/Lymphatic: Aortic atherosclerotic calcifications noted without aneurysm. No enlarged lymph nodes identified. Reproductive: Status post hysterectomy. No adnexal masses. Other: No ascites, focal collection or pneumoperitoneum. Musculoskeletal: No acute or significant osseous findings. IMPRESSION: Enlarging moderate pericardial effusion.  Cardiomegaly again noted. Mild intrahepatic and CBD dilatation to the ampulla without definite obstructing cause. Aortic atherosclerotic calcifications. Electronically Signed   By: Margarette Canada M.D.   On: 06/29/2016 21:41   Dg Chest Port 1 View  Result Date: 06/29/2016 CLINICAL DATA:  Initial evaluation for acute fever. EXAM: PORTABLE CHEST 1 VIEW COMPARISON:  Prior radiograph from Feb 22, 2015. FINDINGS: Fairly pronounced cardiomegaly, slightly progressed from previous. Mediastinal silhouette normal. Aortic atherosclerosis. Lungs normally inflated. No focal infiltrates. No pulmonary edema or pleural effusion. No pneumothorax. No acute osseus abnormality. IMPRESSION: 1. No active cardiopulmonary disease. 2. Pronounced cardiomegaly, slightly worsened relative to 2016. Electronically Signed   By: Jeannine Boga M.D.   On: 06/29/2016 19:39    EKG:   Orders placed or performed during the hospital encounter of 06/29/16  . ED EKG 12-Lead  . ED EKG 12-Lead    IMPRESSION AND PLAN:  Principal Problem:   Sepsis ([place]) - due to UTI, IV antibiotics in place, urine culture sent from the ED, patient is hemodynamically stable with lactic acid within normal limits Active Problems:   UTI (urinary tract infection) - antibiotics and culture as above   Essential hypertension - stable, continue home meds   H/O kidney transplant - continue immunosuppressants, nephrology consult   Dementia - continue home meds  All the records are reviewed and case discussed with ED provider. Management plans discussed with the patient and/or family.  DVT PROPHYLAXIS: SubQ heparin  PROPHYLAXIS: None  ADMISSION STATUS: Inpatient  CODE STATUS: Full Code Status History    Date Active Date Inactive Code Status Order ID Comments User Context   07/06/2014 10:01 PM 07/08/2014  3:37 PM Full Code [id number]  Hower, Aaron Mose, MD ED      TOTAL TIME TAKING CARE OF THIS PATIENT: 45 minutes.   Katesha Eichel [place] 06/29/2016, 10:28 PM  [facility]  Office  [phone]  CC: Primary care physician; [facility]  Note:  This document was prepared using Dragon voice  recognition software and may include unintentional dictation errors.

## 2016-06-29 NOTE — ED Triage Notes (Signed)
Pt has had abdominal pain but is unable to give much details. No vomiting/diarrhea.  Pt is febrile. New onset dementia. Pt disoriented to time and difficult to redirect for tasks.

## 2016-06-29 NOTE — Progress Notes (Signed)
Pharmacy Antibiotic Note  [name] is a 66 y.o. female admitted on 06/29/2016 with sepsis.  Pharmacy has been consulted for Zosyn and Vancomycin dosing. Patient received vancomycin 1gm IV and zosyn 3.375 IV x 1 dose in ED.   Plan: Ke: 0.028    T1/2: 25   Vd: 25  DW: 60kg   Will start patient on Vancomycin 1g IV every 36 hours with 18 hour stack dosing. Will plan for trough prior to 4th dose. Will monitor renal function daily and adjust dose as needed. Calculated trough at C[facility] is 15.  Will order Zosyn 3.375 IV EI every 8 hours.   Weight: 190 lb (86.2 kg)  Temp (24hrs), Avg:101.1 F (38.4 C), Min:101.1 F (38.4 C), Max:101.1 F (38.4 C)   Recent Labs Lab 06/29/16 1916  WBC 10.9    Estimated Creatinine Clearance: 29.5 mL/min (A) (by C-G formula based on SCr of 1.81 mg/dL (H)).    Allergies  Allergen Reactions  . Ace Inhibitors Cough  . Lisinopril Cough    Antimicrobials this admission: 5/16 vancomycin >>  5/16 Zosyn >>   Dose adjustments this admission:  Microbiology results: 5/16 BCx: sent 5/16 UCx: sent  Thank you for allowing pharmacy to be a part of this patient's care.  Pernell Dupre, PharmD, BCPS Clinical Pharmacist 06/29/2016 8:23 PM

## 2016-06-29 NOTE — ED Provider Notes (Signed)
[facility] Emergency Department Provider Note  ____________________________________________   First MD Initiated Contact with Patient 06/29/16 1900     (approximate)  I have reviewed the triage vital signs and the nursing notes.   HISTORY  Chief Complaint Abdominal Pain   HPI [name] is a 66 y.o. female with a history of dementia as well as a kidney transplant 10 years ago in [place] who is presenting to the emergency department with 2 hours of lower abdominal pain. Patient says that when she is standing there is no pain. However, she has a history that is confounded by her dementia. Daughter says the pain started about 2 hours ago. Otherwise she has been feeling well. No nausea vomiting or diarrhea. No cough or runny nose. Patient is compliant with her medications which include sirolimus.   Past Medical History:  Diagnosis Date  . Anemia   . Cancer ([place])   . Dementia   . GERD (gastroesophageal reflux disease)   . Hypertension   . Renal disorder     Patient Active Problem List   Diagnosis Date Noted  . 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 Essential hypertension 07/06/2014  . H/O kidney transplant 07/06/2014    Past Surgical History:  Procedure Laterality Date  . ABDOMINAL HYSTERECTOMY    . AV FISTULA PLACEMENT    . BREAST BIOPSY     Patient is unsure but she thinks she remember a Bx  . KIDNEY TRANSPLANT    . NEPHRECTOMY TRANSPLANTED ORGAN      Prior to Admission medications   Medication Sig Start Date End Date Taking? Authorizing Provider  atorvastatin (LIPITOR) 40 MG tablet Take 40 mg by mouth at bedtime.   Yes [provider]  calcitRIOL (ROCALTROL) 0.25 MCG capsule Take 0.25 mcg by mouth daily.   Yes [provider]  citalopram (CELEXA) 20 MG tablet Take 20 mg by mouth daily.   Yes [provider]  donepezil (ARICEPT) 5 MG tablet Take 5 mg by mouth daily. 04/05/16  Yes [provider]   folic acid (FOLVITE) 1 MG tablet Take 1 mg by mouth 2 (two) times daily.    Yes [provider]  furosemide (LASIX) 20 MG tablet Take 20 mg by mouth daily.   Yes [provider]  losartan (COZAAR) 25 MG tablet Take 25 mg by mouth daily.   Yes [provider]  mycophenolate (MYFORTIC) 180 MG EC tablet Take 180 mg by mouth 2 (two) times daily.   Yes [provider]  potassium chloride SA (K-DUR,KLOR-CON) 20 MEQ tablet Take 20 mEq by mouth daily. 05/13/16  Yes [provider]  predniSONE (DELTASONE) 5 MG tablet Take 5 mg by mouth daily with breakfast.    Yes [provider]  sirolimus (RAPAMUNE) 1 MG tablet Take 3 mg by mouth daily.    Yes [provider]  HYDROcodone-acetaminophen (NORCO/VICODIN) 5-325 MG tablet Take 1 tablet by mouth every 6 (six) hours as needed for moderate pain. Patient not taking: Reported on 06/29/2016 02/01/15   Johnn Hai, PA-C  metoprolol succinate (TOPROL-XL) 25 MG 24 hr tablet Take 25 mg by mouth daily.    [provider]  phenazopyridine (PYRIDIUM) 200 MG tablet Take 1 tablet (200 mg total) by mouth 3 (three) times daily as needed for pain. Patient not taking: Reported on 06/29/2016 08/21/15 08/20/16  Beers, Pierce Crane, PA-C  sulfamethoxazole-trimethoprim (BACTRIM DS,SEPTRA DS) 800-160 MG tablet Take 1 tablet by mouth 2 (two) times  daily. Patient not taking: Reported on 06/29/2016 08/21/15   Arlyss Repress, PA-C    Allergies Ace inhibitors and Lisinopril  Family History  Problem Relation Age of Onset  . Stroke Mother   . Breast cancer Neg Hx     Social History Social History  Substance Use Topics  . Smoking status: Former Research scientist (life sciences)  . Smokeless tobacco: Never Used  . Alcohol use No    Review of Systems  Constitutional: No fever/chills Eyes: No visual changes. ENT: No sore throat. Cardiovascular: Denies chest pain. Respiratory: Denies shortness of breath. Gastrointestinal:No nausea,  no vomiting.  No diarrhea.  No constipation. Genitourinary: Negative for dysuria. Musculoskeletal: Negative for back pain. Skin: Negative for rash. Neurological: Negative for headaches, focal weakness or numbness.   ____________________________________________   PHYSICAL EXAM:  VITAL SIGNS: ED Triage Vitals  Enc Vitals Group     BP 06/29/16 1903 (!) 182/84     Pulse Rate 06/29/16 1857 85     Resp 06/29/16 1857 20     Temp 06/29/16 1857 (!) 101.1 F (38.4 C)     Temp Source 06/29/16 1857 Oral     SpO2 06/29/16 1857 95 %     Weight 06/29/16 1854 190 lb (86.2 kg)     Height --      Head Circumference --      Peak Flow --      Pain Score --      Pain Loc --      Pain Edu? --      Excl. in [place]? --     Constitutional: Alert and oriented. Well appearing and in no acute distress. Eyes: Conjunctivae are normal.  Head: Atraumatic. Nose: No congestion/rhinnorhea. Mouth/Throat: Mucous membranes are moist.  Neck: No stridor.   Cardiovascular: Normal rate, regular rhythm. Grossly normal heart sounds.  Respiratory: Normal respiratory effort.  No retractions. Lungs CTAB. Gastrointestinal: Soft With mild to moderate lower abdominal tenderness which is worse to the left lower quadrant with the patient has her kidney graft.. No distention. No CVA tenderness. Musculoskeletal: No lower extremity tenderness nor edema.  No joint effusions. Neurologic:  Normal speech and language. No gross focal neurologic deficits are appreciated. Walks unassisted with a normal gait. Skin:  Skin is warm, dry and intact. No rash noted. Psychiatric: Mood and affect are normal. Speech and behavior are normal.  ____________________________________________   LABS (all labs ordered are listed, but only abnormal results are displayed)  Labs Reviewed  COMPREHENSIVE METABOLIC PANEL - Abnormal; Notable for the following:       Result Value   Potassium 3.4 (*)    Chloride 97 (*)    Glucose, Bld 120 (*)    BUN  21 (*)    Creatinine, Ser 1.63 (*)    Calcium 8.3 (*)    Albumin 3.2 (*)    GFR calc non Af Amer 32 (*)    GFR calc Af Amer 37 (*)    All other components within normal limits  CBC WITH DIFFERENTIAL/PLATELET - Abnormal; Notable for the following:    Hemoglobin 10.2 (*)    HCT 31.4 (*)    MCV 77.4 (*)    MCH 25.2 (*)    RDW 14.6 (*)    Neutro Abs 9.0 (*)    Lymphs Abs 0.9 (*)    All other components within normal limits  URINALYSIS, COMPLETE (UACMP) WITH MICROSCOPIC - Abnormal; Notable for the following:    Color, Urine YELLOW (*)    APPearance  HAZY (*)    Hgb urine dipstick SMALL (*)    Leukocytes, UA LARGE (*)    Bacteria, UA RARE (*)    Squamous Epithelial / LPF 0-5 (*)    All other components within normal limits  CULTURE, BLOOD (ROUTINE X 2)  CULTURE, BLOOD (ROUTINE X 2)  URINE CULTURE  LACTIC ACID, PLASMA  LIPASE, BLOOD  TROPONIN I  LACTIC ACID, PLASMA  SIROLIMUS LEVEL   ____________________________________________  EKG  ED ECG REPORT I, Doran Stabler, the attending physician, personally viewed and interpreted this ECG.   Date: 06/29/2016  EKG Time: 1912  Rate: 77  Rhythm: normal sinus rhythm  Axis: normal  Intervals:none  ST&T Change: No ST segment elevation or depression. No abnormal T-wave inversion.  ____________________________________________  RADIOLOGY  Chest x-ray without any active cardiopulmonary disease. CT of the abdomen and pelvis with an enlarging pericardial effusion. Mild intrahepatic CD dilatation. ____________________________________________   PROCEDURES  Procedure(s) performed:   Procedures  Critical Care performed:   ____________________________________________   INITIAL IMPRESSION / ASSESSMENT AND PLAN / ED COURSE  Pertinent labs & imaging results that were available during my care of the patient were reviewed by me and considered in my medical decision making (see chart for details).  Sepsis alert called. I also  discussed case with Dr. Holley Raring of nephrology who recommends that we do not do a CAT scan with contrast via the IV.    ----------------------------------------- 10:10 PM on 06/29/2016 -----------------------------------------  Patient without any distress. Appears to have UTI with fever on immunosuppression. Will be admitted to the hospital. Signed out to Dr. Jannifer Franklin. I went into the room twice to explain the diagnosis as well as treatment plan to the family. However, there is no family present. I did extend the diagnosis to the patient as well as need to stay overnight in the hospital. Intrahepatic CBD dilatation. However, normal bili as well as transaminases.  ____________________________________________   FINAL CLINICAL IMPRESSION(S) / ED DIAGNOSES  UTI. Fever.    NEW MEDICATIONS STARTED DURING THIS VISIT:  New Prescriptions   No medications on file     Note:  This document was prepared using Dragon voice recognition software and may include unintentional dictation errors.     Orbie Pyo, MD 06/29/16 [phone]

## 2016-06-30 ENCOUNTER — Inpatient Hospital Stay
Admit: 2016-06-30 | Discharge: 2016-06-30 | Disposition: A | Discharge status: Home / Self Care | Payer: Medicare Other | Attending: Internal Medicine | Admitting: Internal Medicine

## 2016-06-30 LAB — BLOOD CULTURE ID PANEL (REFLEXED)
Acinetobacter baumannii: NOT DETECTED
CANDIDA ALBICANS: NOT DETECTED
CANDIDA PARAPSILOSIS: NOT DETECTED
Candida glabrata: NOT DETECTED
Candida krusei: NOT DETECTED
Candida tropicalis: NOT DETECTED
Carbapenem resistance: NOT DETECTED
ENTEROBACTER CLOACAE COMPLEX: NOT DETECTED
ENTEROBACTERIACEAE SPECIES: DETECTED — AB
ENTEROCOCCUS SPECIES: NOT DETECTED
Escherichia coli: NOT DETECTED
HAEMOPHILUS INFLUENZAE: NOT DETECTED
KLEBSIELLA PNEUMONIAE: DETECTED — AB
Klebsiella oxytoca: NOT DETECTED
Listeria monocytogenes: NOT DETECTED
NEISSERIA MENINGITIDIS: NOT DETECTED
PSEUDOMONAS AERUGINOSA: NOT DETECTED
Proteus species: NOT DETECTED
STREPTOCOCCUS AGALACTIAE: NOT DETECTED
STREPTOCOCCUS PYOGENES: NOT DETECTED
STREPTOCOCCUS SPECIES: NOT DETECTED
Serratia marcescens: NOT DETECTED
Staphylococcus aureus (BCID): NOT DETECTED
Staphylococcus species: NOT DETECTED
Streptococcus pneumoniae: NOT DETECTED

## 2016-06-30 LAB — BASIC METABOLIC PANEL
ANION GAP: 12 (ref 5–15)
BUN: 18 mg/dL (ref 6–20)
CALCIUM: 8.1 mg/dL — AB (ref 8.9–10.3)
CO2: 26 mmol/L (ref 22–32)
Chloride: 101 mmol/L (ref 101–111)
Creatinine, Ser: 1.78 mg/dL — ABNORMAL HIGH (ref 0.44–1.00)
GFR, EST AFRICAN AMERICAN: 33 mL/min — AB (ref >60)
GFR, EST NON AFRICAN AMERICAN: 29 mL/min — AB (ref >60)
GLUCOSE: 153 mg/dL — AB (ref 65–99)
Potassium: 3.1 mmol/L — ABNORMAL LOW (ref 3.5–5.1)
Sodium: 139 mmol/L (ref 135–145)

## 2016-06-30 LAB — CBC
HEMATOCRIT: 30.2 % — AB (ref 35.0–47.0)
HEMOGLOBIN: 10.1 g/dL — AB (ref 12.0–16.0)
MCH: 25.5 pg — ABNORMAL LOW (ref 26.0–34.0)
MCHC: 33.5 g/dL (ref 32.0–36.0)
MCV: 76.2 fL — ABNORMAL LOW (ref 80.0–100.0)
Platelets: 253 10*3/uL (ref 150–440)
RBC: 3.97 MIL/uL (ref 3.80–5.20)
RDW: 14.5 % (ref 11.5–14.5)
WBC: 14.6 10*3/uL — AB (ref 3.6–11.0)

## 2016-06-30 MED ORDER — POTASSIUM CHLORIDE CRYS ER 20 MEQ PO TBCR
40.0000 meq | EXTENDED_RELEASE_TABLET | Freq: Once | ORAL | Status: AC
  Administered 2016-06-30: 40 meq via ORAL
  Filled 2016-06-30: qty 2

## 2016-06-30 MED ORDER — DIPHENHYDRAMINE HCL 25 MG PO CAPS
25.0000 mg | ORAL_CAPSULE | Freq: Every evening | ORAL | Status: DC | PRN
Start: 2016-06-30 — End: 2016-07-02
  Administered 2016-07-01: 25 mg via ORAL
  Filled 2016-06-30: qty 1

## 2016-06-30 MED ORDER — DEXTROSE 5 % IV SOLN
2.0000 g | INTRAVENOUS | Status: DC
  Administered 2016-06-30 – 2016-07-01 (×2): 2 g via INTRAVENOUS
  Filled 2016-06-30 (×3): qty 2

## 2016-06-30 NOTE — Progress Notes (Signed)
[facility]  ROUNDING NOTE   Subjective:  Patient well-known to [place]. In fact Dr. Candiss Norse saw the patient yesterday in the office. After her visit she presented with sudden abdominal pain. On urinalysis she was found to have too numerous to count WBCs. Patient has had a prior episode of transplant pyelonephritis. She's also had some recent diarrhea which could of placed her at risk for a urinary tract infection.   Objective:  Vital signs in last 24 hours:  Temp:  [97.9 F (36.6 C)-101.1 F (38.4 C)] 97.9 F (36.6 C) (05/17 1023) Pulse Rate:  [77-104] 77 (05/17 1018) Resp:  [18-24] 18 (05/17 0530) BP: (116-182)/(56-93) 116/56 (05/17 1018) SpO2:  [94 %-99 %] 94 % (05/17 0530) Weight:  [86.2 kg (190 lb)-92.6 kg (204 lb 1.6 oz)] 92.6 kg (204 lb 1.6 oz) (05/16 2323)  Weight change:  Filed Weights   06/29/16 1854 06/29/16 2323  Weight: 86.2 kg (190 lb) 92.6 kg (204 lb 1.6 oz)    Intake/Output: I/O last 3 completed shifts: In: 250 [IV Piggyback:250] Out: -    Intake/Output this shift:  Total I/O In: 530 [I.V.:486; IV Piggyback:44] Out: 0   Physical Exam: General: No acute distress  Head: Normocephalic, atraumatic. Moist oral mucosal membranes  Eyes: Anicteric  Neck: Supple, trachea midline  Lungs:  Clear to auscultation, normal effort  Heart: S1S2 no rubs  Abdomen:  Soft, nontender, bowel sounds present, no graft tenderness  Extremities: trace peripheral edema.  Neurologic: Awake, alert, following commands  Skin: No lesions       Basic Metabolic Panel:  Recent Labs Lab 06/29/16 1916 06/30/16 0408  NA 135 139  K 3.4* 3.1*  CL 97* 101  CO2 28 26  GLUCOSE 120* 153*  BUN 21* 18  CREATININE 1.63* 1.78*  CALCIUM 8.3* 8.1*    Liver Function Tests:  Recent Labs Lab 06/29/16 1916  AST 18  ALT 14  ALKPHOS 56  BILITOT 0.5  PROT 7.7  ALBUMIN 3.2*    Recent Labs Lab 06/29/16 1916  LIPASE 17   No results for input(s): AMMONIA in the last  168 hours.  CBC:  Recent Labs Lab 06/29/16 1916 06/30/16 0408  WBC 10.9 14.6*  NEUTROABS 9.0*  --   HGB 10.2* 10.1*  HCT 31.4* 30.2*  MCV 77.4* 76.2*  PLT 276 253    Cardiac Enzymes:  Recent Labs Lab 06/29/16 1916  TROPONINI <0.03    BNP: Invalid input(s): POCBNP  CBG: No results for input(s): GLUCAP in the last 168 hours.  Microbiology: Results for orders placed or performed during the hospital encounter of 06/29/16  Blood Culture (routine x 2)     Status: None (Preliminary result)   Collection Time: 06/29/16  7:17 PM  Result Value Ref Range Status   Specimen Description BLOOD R AC  Final   Special Requests   Final    Blood Culture results may not be optimal due to an excessive volume of blood received in culture bottles   Culture  Setup Time   Final    Organism ID to follow GRAM NEGATIVE RODS IN BOTH AEROBIC AND ANAEROBIC BOTTLES CRITICAL RESULT CALLED TO, READ BACK BY AND VERIFIED WITH: HANK ZOMPA AT 8250 ON 06/30/16 ALV    Culture GRAM NEGATIVE RODS  Final   Report Status PENDING  Incomplete  Blood Culture (routine x 2)     Status: None (Preliminary result)   Collection Time: 06/29/16  7:17 PM  Result Value Ref Range Status  Specimen Description BLOOD R HAND  Final   Special Requests Blood Culture adequate volume  Final   Culture NO GROWTH < 12 HOURS  Final   Report Status PENDING  Incomplete  Blood Culture ID Panel (Reflexed)     Status: Abnormal   Collection Time: 06/29/16  7:17 PM  Result Value Ref Range Status   Enterococcus species NOT DETECTED NOT DETECTED Final   Listeria monocytogenes NOT DETECTED NOT DETECTED Final   Staphylococcus species NOT DETECTED NOT DETECTED Final   Staphylococcus aureus NOT DETECTED NOT DETECTED Final   Streptococcus species NOT DETECTED NOT DETECTED Final   Streptococcus agalactiae NOT DETECTED NOT DETECTED Final   Streptococcus pneumoniae NOT DETECTED NOT DETECTED Final   Streptococcus pyogenes NOT DETECTED NOT  DETECTED Final   Acinetobacter baumannii NOT DETECTED NOT DETECTED Final   Enterobacteriaceae species DETECTED (A) NOT DETECTED Final    Comment: Enterobacteriaceae represent a large family of gram-negative bacteria, not a single organism. CRITICAL RESULT CALLED TO, READ BACK BY AND VERIFIED WITH: HANK ZOMPA AT 0092 ON 06/30/16 ALV    Enterobacter cloacae complex NOT DETECTED NOT DETECTED Final   Escherichia coli NOT DETECTED NOT DETECTED Final   Klebsiella oxytoca NOT DETECTED NOT DETECTED Final   Klebsiella pneumoniae DETECTED (A) NOT DETECTED Final    Comment: CRITICAL RESULT CALLED TO, READ BACK BY AND VERIFIED WITH: HANK ZOMPA AT 1246 ON 06/30/16 ALV    Proteus species NOT DETECTED NOT DETECTED Final   Serratia marcescens NOT DETECTED NOT DETECTED Final   Carbapenem resistance NOT DETECTED NOT DETECTED Final   Haemophilus influenzae NOT DETECTED NOT DETECTED Final   Neisseria meningitidis NOT DETECTED NOT DETECTED Final   Pseudomonas aeruginosa NOT DETECTED NOT DETECTED Final   Candida albicans NOT DETECTED NOT DETECTED Final   Candida glabrata NOT DETECTED NOT DETECTED Final   Candida krusei NOT DETECTED NOT DETECTED Final   Candida parapsilosis NOT DETECTED NOT DETECTED Final   Candida tropicalis NOT DETECTED NOT DETECTED Final    Coagulation Studies: No results for input(s): LABPROT, INR in the last 72 hours.  Urinalysis:  Recent Labs  06/29/16 [place] 1.009  PHURINE 6.0  GLUCOSEU NEGATIVE  HGBUR SMALL*  BILIRUBINUR NEGATIVE  KETONESUR NEGATIVE  PROTEINUR NEGATIVE  NITRITE NEGATIVE  LEUKOCYTESUR LARGE*      Imaging: Ct Abdomen Pelvis Wo Contrast  Result Date: 06/29/2016 CLINICAL DATA:  66 year old female with abdominal and pelvic pain with fever. History of kidney transplant. EXAM: CT ABDOMEN AND PELVIS WITHOUT CONTRAST TECHNIQUE: Multidetector CT imaging of the abdomen and pelvis was performed following the standard protocol without  IV contrast. COMPARISON:  06/05/2015 FINDINGS: Please note that parenchymal abnormalities may be missed without intravenous contrast. Lower chest: A moderate pericardial effusion has slightly increased since 2017. Cardiomegaly again noted. Hepatobiliary: The liver is unremarkable. The patient is status post cholecystectomy. Mild intrahepatic and CBD dilatation noted to the ampulla without definite obstructing cause. Pancreas: Unremarkable Spleen: Unremarkable Adrenals/Urinary Tract: A right pelvic renal transplant identified and has a similar appearance to 2017. Atrophic native kidneys noted. The adrenal glands and bladder are unremarkable. Stomach/Bowel: A definite bowel abnormality identified. No definite bowel wall thickening or bowel dilatation. Vascular/Lymphatic: Aortic atherosclerotic calcifications noted without aneurysm. No enlarged lymph nodes identified. Reproductive: Status post hysterectomy. No adnexal masses. Other: No ascites, focal collection or pneumoperitoneum. Musculoskeletal: No acute or significant osseous findings. IMPRESSION: Enlarging moderate pericardial effusion.  Cardiomegaly again noted. Mild intrahepatic and CBD dilatation to the  ampulla without definite obstructing cause. Aortic atherosclerotic calcifications. Electronically Signed   By: Margarette Canada M.D.   On: 06/29/2016 21:41   Dg Chest Port 1 View  Result Date: 06/29/2016 CLINICAL DATA:  Initial evaluation for acute fever. EXAM: PORTABLE CHEST 1 VIEW COMPARISON:  Prior radiograph from February 16, 2015. FINDINGS: Fairly pronounced cardiomegaly, slightly progressed from previous. Mediastinal silhouette normal. Aortic atherosclerosis. Lungs normally inflated. No focal infiltrates. No pulmonary edema or pleural effusion. No pneumothorax. No acute osseus abnormality. IMPRESSION: 1. No active cardiopulmonary disease. 2. Pronounced cardiomegaly, slightly worsened relative to 2016. Electronically Signed   By: Jeannine Boga M.D.   On:  06/29/2016 19:39     Medications:   . cefTRIAXone (ROCEPHIN)  IV     . atorvastatin  40 mg Oral QHS  . citalopram  20 mg Oral Daily  . donepezil  5 mg Oral Daily  . furosemide  20 mg Oral Daily  . heparin  5,000 Units Subcutaneous Q8H  . losartan  25 mg Oral Daily  . metoprolol succinate  25 mg Oral Daily  . mycophenolate  180 mg Oral BID  . potassium chloride  40 mEq Oral Once  . predniSONE  5 mg Oral Q breakfast  . sirolimus  3 mg Oral Daily   acetaminophen **OR** acetaminophen, ondansetron **OR** ondansetron (ZOFRAN) IV  Assessment/ Plan:  66 y.o. 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  1.  S/p renal transplantation. 2.  Chronic kidney disease stage III 3.  Hypertension. 4.  Urinary tract infection  Plan:  It appears that the patient has a recurrent urinary tract infection.  She appears to have Klebsiella in the urine which she's had before.  Agree with ceftriaxone at this time.  In terms of her immunosuppression she does not appear to be toxic now therefore we will maintain current doses of sirolimus, mycophenolate, and prednisone.  We will also maintain the patient on losartan and metoprolol for blood pressure control.  Thanks for consultation.   LOS: 1 Terrika Zuver 5/17/20182:38 PM

## 2016-06-30 NOTE — Progress Notes (Signed)
*  PRELIMINARY RESULTS* Echocardiogram 2D Echocardiogram has been performed.  [name] 06/30/2016, 3:01 PM

## 2016-06-30 NOTE — Plan of Care (Signed)
Problem: Education: Goal: Knowledge of [facility] Education information/materials will improve Outcome: Not Progressing Patient has dementia and needs assistance.  Problem: Health Behavior/Discharge Planning: Goal: Ability to manage health-related needs will improve Outcome: Not Progressing Patient has dementia and needs assistance.

## 2016-06-30 NOTE — Progress Notes (Signed)
PHARMACY - PHYSICIAN COMMUNICATION CRITICAL VALUE ALERT - BLOOD CULTURE IDENTIFICATION (BCID)  Results for orders placed or performed during the hospital encounter of 06/29/16  Blood Culture ID Panel (Reflexed) (Collected: 06/29/2016  7:17 PM)  Result Value Ref Range   Enterococcus species NOT DETECTED NOT DETECTED   Listeria monocytogenes NOT DETECTED NOT DETECTED   Staphylococcus species NOT DETECTED NOT DETECTED   Staphylococcus aureus NOT DETECTED NOT DETECTED   Streptococcus species NOT DETECTED NOT DETECTED   Streptococcus agalactiae NOT DETECTED NOT DETECTED   Streptococcus pneumoniae NOT DETECTED NOT DETECTED   Streptococcus pyogenes NOT DETECTED NOT DETECTED   Acinetobacter baumannii NOT DETECTED NOT DETECTED   Enterobacteriaceae species DETECTED (A) NOT DETECTED   Enterobacter cloacae complex NOT DETECTED NOT DETECTED   Escherichia coli NOT DETECTED NOT DETECTED   Klebsiella oxytoca NOT DETECTED NOT DETECTED   Klebsiella pneumoniae DETECTED (A) NOT DETECTED   Proteus species NOT DETECTED NOT DETECTED   Serratia marcescens NOT DETECTED NOT DETECTED   Carbapenem resistance NOT DETECTED NOT DETECTED   Haemophilus influenzae NOT DETECTED NOT DETECTED   Neisseria meningitidis NOT DETECTED NOT DETECTED   Pseudomonas aeruginosa NOT DETECTED NOT DETECTED   Candida albicans NOT DETECTED NOT DETECTED   Candida glabrata NOT DETECTED NOT DETECTED   Candida krusei NOT DETECTED NOT DETECTED   Candida parapsilosis NOT DETECTED NOT DETECTED   Candida tropicalis NOT DETECTED NOT DETECTED    Name of physician (or Provider) Contacted: [place]  Changes to prescribed antibiotics required: Will discontinue Vancomycin and Zosyn. Will order Ceftriaxone 2g IV q24h.  Paulina Fusi, PharmD, BCPS 06/30/2016 1:55 PM

## 2016-06-30 NOTE — Progress Notes (Signed)
Notified Dr. Leslye Peer of BP of 116/56. Per MD hold lasix and give losartan and metoprolol.

## 2016-06-30 NOTE — Evaluation (Signed)
Physical Therapy Evaluation and Discharge Patient Details Name: [name] MRN: [id number] DOB: 07-30-1950 Today's Date: 06/30/2016   History of Present Illness  Pt is a 66 y/o F who presented with abdominal pain.  Pt found to have UTI and was admitted for sepsis.  Pt's PMH includes dementia, cancer, anemia, kidney transplant.   Clinical Impression  Pt admitted with above diagnosis. Pt is independent with all mobility at baseline and is independent with all aspects of mobility today. Pt scored 23/24 on the DGI as she uses the railing to remain steady when ascending/descending steps.  No assist needed with this, pt independent when using railing.  Pt independent with all aspects of mobility but with dementia at baseline.  She is alone during the day when her daughter is at work and her son is at dialysis.  Given that she has cognitive impairments, she may benefit from supervision when her family is not home, CM made aware. No skilled PT needs identified, PT will sign off.    Follow Up Recommendations No PT follow up    Equipment Recommendations  None recommended by PT    Recommendations for Other Services       Precautions / Restrictions Precautions Precautions: Other (comment) Precaution Comments: Monitor HR Restrictions Weight Bearing Restrictions: No      Mobility  Bed Mobility Overal bed mobility: Independent             General bed mobility comments: No physical assist or cues needed.  No instability noted.  Transfers Overall transfer level: Independent Equipment used: None             General transfer comment: No physical assist or cues needed.  No instability noted.  Ambulation/Gait Ambulation/Gait assistance: Independent Ambulation Distance (Feet): 250 Feet Assistive device: None Gait Pattern/deviations: WFL(Within Functional Limits)   Gait velocity interpretation: at or above normal speed for age/gender General Gait Details: No abnormalities  noted.  No instability.  Pt independent, even with challenges to her balance while ambulating.  HR up to 144 when ambulating but pt asymptomatic.  Stairs            Wheelchair Mobility    Modified Rankin (Stroke Patients Only)       Balance Overall balance assessment: Independent                               Standardized Balance Assessment Standardized Balance Assessment : Dynamic Gait Index   Dynamic Gait Index Level Surface: Normal Change in Gait Speed: Normal Gait with Horizontal Head Turns: Normal Gait with Vertical Head Turns: Normal Gait and Pivot Turn: Normal Step Over Obstacle: Normal Step Around Obstacles: Normal Steps: Mild Impairment (uses rail) Total Score: 23       Pertinent Vitals/Pain Pain Assessment: No/denies pain    Home Living Family/patient expects to be discharged to:: Private residence Living Arrangements: Children Available Help at Discharge: Family;Available PRN/intermittently Type of Home: Apartment Home Access: Stairs to enter Entrance Stairs-Rails: Right Entrance Stairs-Number of Steps: 5 Home Layout: One level Home Equipment: Grab bars - tub/shower      Prior Function Level of Independence: Independent         Comments: Pt reports she does not use AD to ambulate and is able to ambulate community distances.  Ind with bathing, dressing, cooking, cleaning.  Does not drive, her daughter does this.  Denies any falls in the past 6 months.  Hand Dominance        Extremity/Trunk Assessment   Upper Extremity Assessment Upper Extremity Assessment: Overall WFL for tasks assessed    Lower Extremity Assessment Lower Extremity Assessment: Overall WFL for tasks assessed       Communication   Communication: No difficulties  Cognition Arousal/Alertness: Awake/alert Behavior During Therapy: WFL for tasks assessed/performed Overall Cognitive Status: History of cognitive impairments - at baseline                                  General Comments: Not oriented to time, situation.  Able to report that she is at [facility] but unable to report which specific hospital or which town/city she is in.      General Comments General comments (skin integrity, edema, etc.): Pt scored 23/24 on the DGI as she uses the railing to remain steady.  No assist needed with this, pt independent when using railing.  Pt independent with all aspects of mobility but with dementia at baseline.  She is alone during the day when her daughter is at work and her son is at dialysis.  Given that she has cognitive impairments, she may benefit from supervision when her family is not home, CM made aware.     Exercises     Assessment/Plan    PT Assessment Patent does not need any further PT services  PT Problem List         PT Treatment Interventions      PT Goals (Current goals can be found in the Care Plan section)  Acute Rehab PT Goals Patient Stated Goal: to go home PT Goal Formulation: All assessment and education complete, DC therapy    Frequency     Barriers to discharge        Co-evaluation               AM-PAC PT "6 Clicks" Daily Activity  Outcome Measure Difficulty turning over in bed (including adjusting bedclothes, sheets and blankets)?: None Difficulty moving from lying on back to sitting on the side of the bed? : None Difficulty sitting down on and standing up from a chair with arms (e.g., wheelchair, bedside commode, etc,.)?: None Help needed moving to and from a bed to chair (including a wheelchair)?: None Help needed walking in hospital room?: None Help needed climbing 3-5 steps with a railing? : None 6 Click Score: 24    End of Session Equipment Utilized During Treatment: Gait belt Activity Tolerance: Patient tolerated treatment well Patient left: in chair;with call bell/phone within reach;with chair alarm set Nurse Communication: Mobility status;Other (comment) (HR) PT Visit  Diagnosis: Unsteadiness on feet (R26.81)    Time: 2774-1287 PT Time Calculation (min) (ACUTE ONLY): 26 min   Charges:   PT Evaluation $PT Eval Low Complexity: 1 Procedure     PT G Codes:   PT G-Codes **NOT FOR INPATIENT CLASS** Functional Assessment Tool Used: Clinical judgement;AM-PAC 6 Clicks Basic Mobility;Dynamic Gait Index Functional Limitation: Mobility: Walking and moving around Mobility: Walking and Moving Around Current Status [phone]): 0 percent impaired, limited or restricted Mobility: Walking and Moving Around Goal Status [phone]): 0 percent impaired, limited or restricted Mobility: Walking and Moving Around Discharge Status [phone]): 0 percent impaired, limited or restricted     Collie Siad PT, DPT 06/30/2016, 3:28 PM

## 2016-06-30 NOTE — Progress Notes (Signed)
Patient ID: [name], female   DOB: Jan 14, 1951, 66 y.o.   MRN: [id number]  [employer] PROGRESS NOTE  [name] JFH:[id number] DOB: [birth year] DOA: 06/29/2016 PCP: [facility]  HPI/Subjective: Patient feeling better today. As per the nurse was a little bit weak. Patient not having any abdominal pain. No burning on urination. She did have some diarrhea prior to coming into the hospital. Blood culture this afternoon came back positive for Klebsiella. Patient's presenting symptoms were altered mental status and confusion and fever.  Objective: Vitals:   06/30/16 1023 06/30/16 1521  BP:  (!) 121/58  Pulse:  64  Resp:  20  Temp: 97.9 F (36.6 C) 98.9 F (37.2 C)    Filed Weights   06/29/16 1854 06/29/16 2323  Weight: 86.2 kg (190 lb) 92.6 kg (204 lb 1.6 oz)    ROS: Review of Systems  Constitutional: Negative for chills and fever.  Eyes: Negative for blurred vision.  Respiratory: Negative for cough and shortness of breath.   Cardiovascular: Negative for chest pain.  Gastrointestinal: Negative for abdominal pain, constipation, diarrhea, nausea and vomiting.  Genitourinary: Negative for dysuria.  Musculoskeletal: Negative for joint pain.  Neurological: Negative for dizziness and headaches.   Exam: Physical Exam  Constitutional: She is oriented to person, place, and time.  HENT:  Nose: No mucosal edema.  Mouth/Throat: No oropharyngeal exudate or posterior oropharyngeal edema.  Eyes: Conjunctivae, EOM and lids are normal. Pupils are equal, round, and reactive to light.  Neck: No JVD present. Carotid bruit is not present. No edema present. No thyroid mass and no thyromegaly present.  Cardiovascular: S1 normal and S2 normal.  Exam reveals no gallop.   No murmur heard. Pulses:      Dorsalis pedis pulses are 2+ on the right side, and 2+ on the left side.  Respiratory: No respiratory distress. She has no wheezes. She has no rhonchi. She has no rales.   GI: Soft. Bowel sounds are normal. There is no tenderness.  Musculoskeletal:       Right ankle: She exhibits no swelling.       Left ankle: She exhibits no swelling.  Lymphadenopathy:    She has no cervical adenopathy.  Neurological: She is alert and oriented to person, place, and time. No cranial nerve deficit.  Skin: Skin is warm. No rash noted. Nails show no clubbing.  Psychiatric: She has a normal mood and affect.      Data Reviewed: Basic Metabolic Panel:  Recent Labs Lab 06/29/16 1916 06/30/16 0408  NA 135 139  K 3.4* 3.1*  CL 97* 101  CO2 28 26  GLUCOSE 120* 153*  BUN 21* 18  CREATININE 1.63* 1.78*  CALCIUM 8.3* 8.1*   Liver Function Tests:  Recent Labs Lab 06/29/16 1916  AST 18  ALT 14  ALKPHOS 56  BILITOT 0.5  PROT 7.7  ALBUMIN 3.2*    Recent Labs Lab 06/29/16 1916  LIPASE 17   CBC:  Recent Labs Lab 06/29/16 1916 06/30/16 0408  WBC 10.9 14.6*  NEUTROABS 9.0*  --   HGB 10.2* 10.1*  HCT 31.4* 30.2*  MCV 77.4* 76.2*  PLT 276 253   Cardiac Enzymes:  Recent Labs Lab 06/29/16 1916  TROPONINI <0.03     Recent Results (from the past 240 hour(s))  Blood Culture (routine x 2)     Status: None (Preliminary result)   Collection Time: 06/29/16  7:17 PM  Result Value Ref Range Status   Specimen Description BLOOD  R AC  Final   Special Requests   Final    Blood Culture results may not be optimal due to an excessive volume of blood received in culture bottles   Culture  Setup Time   Final    Organism ID to follow GRAM NEGATIVE RODS IN BOTH AEROBIC AND ANAEROBIC BOTTLES CRITICAL RESULT CALLED TO, READ BACK BY AND VERIFIED WITH: HANK ZOMPA AT 6967 ON 06/30/16 ALV    Culture GRAM NEGATIVE RODS  Final   Report Status PENDING  Incomplete  Blood Culture (routine x 2)     Status: None (Preliminary result)   Collection Time: 06/29/16  7:17 PM  Result Value Ref Range Status   Specimen Description BLOOD R HAND  Final   Special Requests Blood  Culture adequate volume  Final   Culture NO GROWTH < 12 HOURS  Final   Report Status PENDING  Incomplete  Blood Culture ID Panel (Reflexed)     Status: Abnormal   Collection Time: 06/29/16  7:17 PM  Result Value Ref Range Status   Enterococcus species NOT DETECTED NOT DETECTED Final   Listeria monocytogenes NOT DETECTED NOT DETECTED Final   Staphylococcus species NOT DETECTED NOT DETECTED Final   Staphylococcus aureus NOT DETECTED NOT DETECTED Final   Streptococcus species NOT DETECTED NOT DETECTED Final   Streptococcus agalactiae NOT DETECTED NOT DETECTED Final   Streptococcus pneumoniae NOT DETECTED NOT DETECTED Final   Streptococcus pyogenes NOT DETECTED NOT DETECTED Final   Acinetobacter baumannii NOT DETECTED NOT DETECTED Final   Enterobacteriaceae species DETECTED (A) NOT DETECTED Final    Comment: Enterobacteriaceae represent a large family of gram-negative bacteria, not a single organism. CRITICAL RESULT CALLED TO, READ BACK BY AND VERIFIED WITH: HANK ZOMPA AT 8938 ON 06/30/16 ALV    Enterobacter cloacae complex NOT DETECTED NOT DETECTED Final   Escherichia coli NOT DETECTED NOT DETECTED Final   Klebsiella oxytoca NOT DETECTED NOT DETECTED Final   Klebsiella pneumoniae DETECTED (A) NOT DETECTED Final    Comment: CRITICAL RESULT CALLED TO, READ BACK BY AND VERIFIED WITH: HANK ZOMPA AT 1246 ON 06/30/16 ALV    Proteus species NOT DETECTED NOT DETECTED Final   Serratia marcescens NOT DETECTED NOT DETECTED Final   Carbapenem resistance NOT DETECTED NOT DETECTED Final   Haemophilus influenzae NOT DETECTED NOT DETECTED Final   Neisseria meningitidis NOT DETECTED NOT DETECTED Final   Pseudomonas aeruginosa NOT DETECTED NOT DETECTED Final   Candida albicans NOT DETECTED NOT DETECTED Final   Candida glabrata NOT DETECTED NOT DETECTED Final   Candida krusei NOT DETECTED NOT DETECTED Final   Candida parapsilosis NOT DETECTED NOT DETECTED Final   Candida tropicalis NOT DETECTED NOT  DETECTED Final     Studies: Ct Abdomen Pelvis Wo Contrast  Result Date: 06/29/2016 CLINICAL DATA:  66 year old female with abdominal and pelvic pain with fever. History of kidney transplant. EXAM: CT ABDOMEN AND PELVIS WITHOUT CONTRAST TECHNIQUE: Multidetector CT imaging of the abdomen and pelvis was performed following the standard protocol without IV contrast. COMPARISON:  06/05/2015 FINDINGS: Please note that parenchymal abnormalities may be missed without intravenous contrast. Lower chest: A moderate pericardial effusion has slightly increased since 2017. Cardiomegaly again noted. Hepatobiliary: The liver is unremarkable. The patient is status post cholecystectomy. Mild intrahepatic and CBD dilatation noted to the ampulla without definite obstructing cause. Pancreas: Unremarkable Spleen: Unremarkable Adrenals/Urinary Tract: A right pelvic renal transplant identified and has a similar appearance to 2017. Atrophic native kidneys noted. The adrenal glands and bladder  are unremarkable. Stomach/Bowel: A definite bowel abnormality identified. No definite bowel wall thickening or bowel dilatation. Vascular/Lymphatic: Aortic atherosclerotic calcifications noted without aneurysm. No enlarged lymph nodes identified. Reproductive: Status post hysterectomy. No adnexal masses. Other: No ascites, focal collection or pneumoperitoneum. Musculoskeletal: No acute or significant osseous findings. IMPRESSION: Enlarging moderate pericardial effusion.  Cardiomegaly again noted. Mild intrahepatic and CBD dilatation to the ampulla without definite obstructing cause. Aortic atherosclerotic calcifications. Electronically Signed   By: Margarette Canada M.D.   On: 06/29/2016 21:41   Dg Chest Port 1 View  Result Date: 06/29/2016 CLINICAL DATA:  Initial evaluation for acute fever. EXAM: PORTABLE CHEST 1 VIEW COMPARISON:  Prior radiograph from 02-18-15. FINDINGS: Fairly pronounced cardiomegaly, slightly progressed from previous.  Mediastinal silhouette normal. Aortic atherosclerosis. Lungs normally inflated. No focal infiltrates. No pulmonary edema or pleural effusion. No pneumothorax. No acute osseus abnormality. IMPRESSION: 1. No active cardiopulmonary disease. 2. Pronounced cardiomegaly, slightly worsened relative to 2016. Electronically Signed   By: Jeannine Boga M.D.   On: 06/29/2016 19:39    Scheduled Meds: . atorvastatin  40 mg Oral QHS  . citalopram  20 mg Oral Daily  . donepezil  5 mg Oral Daily  . furosemide  20 mg Oral Daily  . heparin  5,000 Units Subcutaneous Q8H  . losartan  25 mg Oral Daily  . metoprolol succinate  25 mg Oral Daily  . mycophenolate  180 mg Oral BID  . predniSONE  5 mg Oral Q breakfast  . sirolimus  3 mg Oral Daily   Continuous Infusions: . cefTRIAXone (ROCEPHIN)  IV      Assessment/Plan:  1. Sepsis with Klebsiella. Positive blood cultures. Still awaiting urine cultures. Changed aggressive antibiotics over to Rocephin. 2. Pericardial effusion seen on CT scan. Echocardiogram ordered. 3. Chronic kidney disease stage III on her transplanted kidney. Continue immunosuppressive medications. 4. Essential hypertension. Blood pressure was a little low this morning on asked him to hold Lasix. Okay to give losartan and metoprolol 5. Hyperlipidemia unspecified on atorvastatin 6. Depression on Celexa 7. 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

## 2016-06-30 NOTE — Progress Notes (Signed)
[place] responded to an OR for an AD in room 222. Pt was a bit groggy but alert enough to be educated on the AD. Pt appreciated the education and will have family review the material. CH left AD with the Pt and will follow up if desired completion at [facility].    06/30/16 1000  Clinical Encounter Type  Visited With Patient;Health care provider  Visit Type Initial;Spiritual support  Referral From Nurse  Spiritual Encounters  Spiritual Needs Literature

## 2016-06-30 NOTE — Progress Notes (Signed)
Called Dr. Jannifer Franklin regarding diet order for patient.  Appropriate orders were placed.  Christene Slates  06/30/2016  12:07 AM

## 2016-07-01 LAB — BASIC METABOLIC PANEL
ANION GAP: 8 (ref 5–15)
BUN: 17 mg/dL (ref 6–20)
CALCIUM: 8.1 mg/dL — AB (ref 8.9–10.3)
CHLORIDE: 103 mmol/L (ref 101–111)
CO2: 26 mmol/L (ref 22–32)
Creatinine, Ser: 1.48 mg/dL — ABNORMAL HIGH (ref 0.44–1.00)
GFR calc Af Amer: 42 mL/min — ABNORMAL LOW (ref >60)
GFR calc non Af Amer: 36 mL/min — ABNORMAL LOW (ref >60)
GLUCOSE: 101 mg/dL — AB (ref 65–99)
POTASSIUM: 3.3 mmol/L — AB (ref 3.5–5.1)
Sodium: 137 mmol/L (ref 135–145)

## 2016-07-01 LAB — ECHOCARDIOGRAM COMPLETE
HEIGHTINCHES: 59 in
WEIGHTICAEL: 3265.6 [oz_av]

## 2016-07-01 LAB — SIROLIMUS LEVEL: Sirolimus (Rapamycin): 8.2 ng/mL (ref 3.0–20.0)

## 2016-07-01 MED ORDER — POTASSIUM CHLORIDE CRYS ER 20 MEQ PO TBCR
40.0000 meq | EXTENDED_RELEASE_TABLET | Freq: Once | ORAL | Status: AC
  Administered 2016-07-01: 40 meq via ORAL
  Filled 2016-07-01: qty 2

## 2016-07-01 NOTE — Progress Notes (Signed)
Patient ID: [name], female   DOB: 07/13/1950, 66 y.o.   MRN: [id number]   [employer] PROGRESS NOTE  [name] MPN:[id number] DOB: 19-Aug-1950 DOA: 06/29/2016 PCP: [facility]  HPI/Subjective: Patient feels well. Strength is better. Nurses just call me this afternoon that she's having diarrhea  Objective: Vitals:   07/01/16 1056 07/01/16 1234  BP: 129/62 125/68  Pulse:  66  Resp:  20  Temp:  98.4 F (36.9 C)    Filed Weights   06/29/16 1854 06/29/16 2323  Weight: 86.2 kg (190 lb) 92.6 kg (204 lb 1.6 oz)    ROS: Review of Systems  Constitutional: Negative for chills and fever.  Eyes: Negative for blurred vision.  Respiratory: Negative for cough and shortness of breath.   Cardiovascular: Negative for chest pain.  Gastrointestinal: Positive for diarrhea. Negative for abdominal pain, constipation, nausea and vomiting.  Genitourinary: Negative for dysuria.  Musculoskeletal: Negative for joint pain.  Neurological: Negative for dizziness and headaches.   Exam: Physical Exam  Constitutional: She is oriented to person, place, and time.  HENT:  Nose: No mucosal edema.  Mouth/Throat: No oropharyngeal exudate or posterior oropharyngeal edema.  Eyes: Conjunctivae, EOM and lids are normal. Pupils are equal, round, and reactive to light.  Neck: No JVD present. Carotid bruit is not present. No edema present. No thyroid mass and no thyromegaly present.  Cardiovascular: S1 normal and S2 normal.  Exam reveals no gallop.   No murmur heard. Pulses:      Dorsalis pedis pulses are 2+ on the right side, and 2+ on the left side.  Respiratory: No respiratory distress. She has no wheezes. She has no rhonchi. She has no rales.  GI: Soft. Bowel sounds are normal. There is no tenderness.  Musculoskeletal:       Right ankle: She exhibits no swelling.       Left ankle: She exhibits no swelling.  Lymphadenopathy:    She has no cervical adenopathy.   Neurological: She is alert and oriented to person, place, and time. No cranial nerve deficit.  Skin: Skin is warm. No rash noted. Nails show no clubbing.  Psychiatric: She has a normal mood and affect.      Data Reviewed: Basic Metabolic Panel:  Recent Labs Lab 06/29/16 1916 06/30/16 0408 07/01/16 0816  NA 135 139 137  K 3.4* 3.1* 3.3*  CL 97* 101 103  CO2 28 26 26   GLUCOSE 120* 153* 101*  BUN 21* 18 17  CREATININE 1.63* 1.78* 1.48*  CALCIUM 8.3* 8.1* 8.1*   Liver Function Tests:  Recent Labs Lab 06/29/16 1916  AST 18  ALT 14  ALKPHOS 56  BILITOT 0.5  PROT 7.7  ALBUMIN 3.2*    Recent Labs Lab 06/29/16 1916  LIPASE 17   CBC:  Recent Labs Lab 06/29/16 1916 06/30/16 0408  WBC 10.9 14.6*  NEUTROABS 9.0*  --   HGB 10.2* 10.1*  HCT 31.4* 30.2*  MCV 77.4* 76.2*  PLT 276 253   Cardiac Enzymes:  Recent Labs Lab 06/29/16 1916  TROPONINI <0.03     Recent Results (from the past 240 hour(s))  Blood Culture (routine x 2)     Status: Abnormal (Preliminary result)   Collection Time: 06/29/16  7:17 PM  Result Value Ref Range Status   Specimen Description BLOOD R AC  Final   Special Requests   Final    Blood Culture results may not be optimal due to an excessive volume of blood received  in culture bottles   Culture  Setup Time   Final    GRAM NEGATIVE RODS IN BOTH AEROBIC AND ANAEROBIC BOTTLES CRITICAL RESULT CALLED TO, READ BACK BY AND VERIFIED WITH: HANK ZOMPA AT 6269 ON 06/30/16 ALV    Culture (A)  Final    KLEBSIELLA PNEUMONIAE SUSCEPTIBILITIES TO FOLLOW Performed at [facility], [place] [address] [place], [place] [address]    Report Status PENDING  Incomplete  Blood Culture (routine x 2)     Status: None (Preliminary result)   Collection Time: 06/29/16  7:17 PM  Result Value Ref Range Status   Specimen Description BLOOD R HAND  Final   Special Requests Blood Culture adequate volume  Final   Culture NO GROWTH 2 DAYS  Final   Report  Status PENDING  Incomplete  Urine culture     Status: Abnormal (Preliminary result)   Collection Time: 06/29/16  7:17 PM  Result Value Ref Range Status   Specimen Description URINE, RANDOM  Final   Special Requests NONE  Final   Culture (A)  Final    >=100,000 COLONIES/mL KLEBSIELLA PNEUMONIAE SUSCEPTIBILITIES TO FOLLOW Performed at [facility], [place] [address], [place], [place] [address]    Report Status PENDING  Incomplete  Blood Culture ID Panel (Reflexed)     Status: Abnormal   Collection Time: 06/29/16  7:17 PM  Result Value Ref Range Status   Enterococcus species NOT DETECTED NOT DETECTED Final   Listeria monocytogenes NOT DETECTED NOT DETECTED Final   Staphylococcus species NOT DETECTED NOT DETECTED Final   Staphylococcus aureus NOT DETECTED NOT DETECTED Final   Streptococcus species NOT DETECTED NOT DETECTED Final   Streptococcus agalactiae NOT DETECTED NOT DETECTED Final   Streptococcus pneumoniae NOT DETECTED NOT DETECTED Final   Streptococcus pyogenes NOT DETECTED NOT DETECTED Final   Acinetobacter baumannii NOT DETECTED NOT DETECTED Final   Enterobacteriaceae species DETECTED (A) NOT DETECTED Final    Comment: Enterobacteriaceae represent a large family of gram-negative bacteria, not a single organism. CRITICAL RESULT CALLED TO, READ BACK BY AND VERIFIED WITH: HANK ZOMPA AT 0093 ON 06/30/16 ALV    Enterobacter cloacae complex NOT DETECTED NOT DETECTED Final   Escherichia coli NOT DETECTED NOT DETECTED Final   Klebsiella oxytoca NOT DETECTED NOT DETECTED Final   Klebsiella pneumoniae DETECTED (A) NOT DETECTED Final    Comment: CRITICAL RESULT CALLED TO, READ BACK BY AND VERIFIED WITH: HANK ZOMPA AT 1246 ON 06/30/16 ALV    Proteus species NOT DETECTED NOT DETECTED Final   Serratia marcescens NOT DETECTED NOT DETECTED Final   Carbapenem resistance NOT DETECTED NOT DETECTED Final   Haemophilus influenzae NOT DETECTED NOT DETECTED Final   Neisseria meningitidis NOT  DETECTED NOT DETECTED Final   Pseudomonas aeruginosa NOT DETECTED NOT DETECTED Final   Candida albicans NOT DETECTED NOT DETECTED Final   Candida glabrata NOT DETECTED NOT DETECTED Final   Candida krusei NOT DETECTED NOT DETECTED Final   Candida parapsilosis NOT DETECTED NOT DETECTED Final   Candida tropicalis NOT DETECTED NOT DETECTED Final     Studies: Ct Abdomen Pelvis Wo Contrast  Result Date: 06/29/2016 CLINICAL DATA:  66 year old female with abdominal and pelvic pain with fever. History of kidney transplant. EXAM: CT ABDOMEN AND PELVIS WITHOUT CONTRAST TECHNIQUE: Multidetector CT imaging of the abdomen and pelvis was performed following the standard protocol without IV contrast. COMPARISON:  06/05/2015 FINDINGS: Please note that parenchymal abnormalities may be missed without intravenous contrast. Lower chest: A  moderate pericardial effusion has slightly increased since 2017. Cardiomegaly again noted. Hepatobiliary: The liver is unremarkable. The patient is status post cholecystectomy. Mild intrahepatic and CBD dilatation noted to the ampulla without definite obstructing cause. Pancreas: Unremarkable Spleen: Unremarkable Adrenals/Urinary Tract: A right pelvic renal transplant identified and has a similar appearance to 2017. Atrophic native kidneys noted. The adrenal glands and bladder are unremarkable. Stomach/Bowel: A definite bowel abnormality identified. No definite bowel wall thickening or bowel dilatation. Vascular/Lymphatic: Aortic atherosclerotic calcifications noted without aneurysm. No enlarged lymph nodes identified. Reproductive: Status post hysterectomy. No adnexal masses. Other: No ascites, focal collection or pneumoperitoneum. Musculoskeletal: No acute or significant osseous findings. IMPRESSION: Enlarging moderate pericardial effusion.  Cardiomegaly again noted. Mild intrahepatic and CBD dilatation to the ampulla without definite obstructing cause. Aortic atherosclerotic  calcifications. Electronically Signed   By: Margarette Canada M.D.   On: 06/29/2016 21:41   Dg Chest Port 1 View  Result Date: 06/29/2016 CLINICAL DATA:  Initial evaluation for acute fever. EXAM: PORTABLE CHEST 1 VIEW COMPARISON:  Prior radiograph from 02/24/2015. FINDINGS: Fairly pronounced cardiomegaly, slightly progressed from previous. Mediastinal silhouette normal. Aortic atherosclerosis. Lungs normally inflated. No focal infiltrates. No pulmonary edema or pleural effusion. No pneumothorax. No acute osseus abnormality. IMPRESSION: 1. No active cardiopulmonary disease. 2. Pronounced cardiomegaly, slightly worsened relative to 2016. Electronically Signed   By: Jeannine Boga M.D.   On: 06/29/2016 19:39    Scheduled Meds: . atorvastatin  40 mg Oral QHS  . citalopram  20 mg Oral Daily  . donepezil  5 mg Oral Daily  . heparin  5,000 Units Subcutaneous Q8H  . losartan  25 mg Oral Daily  . metoprolol succinate  25 mg Oral Daily  . mycophenolate  180 mg Oral BID  . predniSONE  5 mg Oral Q breakfast  . sirolimus  3 mg Oral Daily   Continuous Infusions: . cefTRIAXone (ROCEPHIN)  IV 2 g (07/01/16 1613)    Assessment/Plan:  1. Sepsis with Klebsiella. Positive blood cultures. Still awaiting urine cultures. Continue IV Rocephin.  Likely can switch over to by mouth Keflex upon discharge home 2. Moderate Pericardial effusion seen on CT scan and echocardiogram. Will obtain cardiology consult 3. Chronic kidney disease stage III on her transplanted kidney. Continue immunosuppressive medications. 4. Essential hypertension. hold Lasix. Continue losartan and metoprolol 5. Hyperlipidemia unspecified on atorvastatin 6. Depression on Celexa 7. History of mild dementia on Aricept  Code Status:     Code Status Orders        Start     Ordered   06/29/16 2314  Full code  Continuous     06/29/16 2314    Code Status History    Date Active Date Inactive Code Status Order ID Comments User Context    07/06/2014 10:01 PM 07/08/2014  3:37 PM Full Code [id number]  Hower, Aaron Mose, MD ED     Family Communication: Patient wanted me to speak in front of [place] Disposition Plan: Likely home over the weekend  Consultants:  Nephrology  Antibiotics:  Rocephin  Time spent: 26 minutes  Loletha Grayer  [employer]

## 2016-07-01 NOTE — Progress Notes (Signed)
[facility]  ROUNDING NOTE   Subjective:  Patient found to have Klebsiella in the blood and urine. Renal function has slightly improved. Case discussed with hospitalist. She is to be continued on intravenous antibiotics for 1 additional day.  Objective:  Vital signs in last 24 hours:  Temp:  [98 F (36.7 C)-98.9 F (37.2 C)] 98 F (36.7 C) (05/18 0610) Pulse Rate:  [54-72] 54 (05/18 0610) Resp:  [18-20] 19 (05/18 0610) BP: (121-140)/(57-62) 129/62 (05/18 1056) SpO2:  [97 %-100 %] 98 % (05/18 0610)  Weight change:  Filed Weights   06/29/16 1854 06/29/16 2323  Weight: 86.2 kg (190 lb) 92.6 kg (204 lb 1.6 oz)    Intake/Output: I/O last 3 completed shifts: In: 900 [P.O.:120; I.V.:486; IV Piggyback:294] Out: 250 [Urine:250]   Intake/Output this shift:  Total I/O In: 240 [P.O.:240] Out: -   Physical Exam: General: No acute distress  Head: Normocephalic, atraumatic. Moist oral mucosal membranes  Eyes: Anicteric  Neck: Supple, trachea midline  Lungs:  Clear to auscultation, normal effort  Heart: S1S2 no rubs  Abdomen:  Soft, nontender, bowel sounds present, no graft tenderness  Extremities: trace peripheral edema.  Neurologic: Awake, alert, following commands  Skin: No lesions       Basic Metabolic Panel:  Recent Labs Lab 06/29/16 1916 06/30/16 0408 07/01/16 0816  NA 135 139 137  K 3.4* 3.1* 3.3*  CL 97* 101 103  CO2 28 26 26   GLUCOSE 120* 153* 101*  BUN 21* 18 17  CREATININE 1.63* 1.78* 1.48*  CALCIUM 8.3* 8.1* 8.1*    Liver Function Tests:  Recent Labs Lab 06/29/16 1916  AST 18  ALT 14  ALKPHOS 56  BILITOT 0.5  PROT 7.7  ALBUMIN 3.2*    Recent Labs Lab 06/29/16 1916  LIPASE 17   No results for input(s): AMMONIA in the last 168 hours.  CBC:  Recent Labs Lab 06/29/16 1916 06/30/16 0408  WBC 10.9 14.6*  NEUTROABS 9.0*  --   HGB 10.2* 10.1*  HCT 31.4* 30.2*  MCV 77.4* 76.2*  PLT 276 253    Cardiac  Enzymes:  Recent Labs Lab 06/29/16 1916  TROPONINI <0.03    BNP: Invalid input(s): POCBNP  CBG: No results for input(s): GLUCAP in the last 168 hours.  Microbiology: Results for orders placed or performed during the hospital encounter of 06/29/16  Blood Culture (routine x 2)     Status: Abnormal (Preliminary result)   Collection Time: 06/29/16  7:17 PM  Result Value Ref Range Status   Specimen Description BLOOD R AC  Final   Special Requests   Final    Blood Culture results may not be optimal due to an excessive volume of blood received in culture bottles   Culture  Setup Time   Final    GRAM NEGATIVE RODS IN BOTH AEROBIC AND ANAEROBIC BOTTLES CRITICAL RESULT CALLED TO, READ BACK BY AND VERIFIED WITH: HANK ZOMPA AT 3557 ON 06/30/16 ALV    Culture (A)  Final    KLEBSIELLA PNEUMONIAE SUSCEPTIBILITIES TO FOLLOW Performed at [facility], [place] [address], [place], [place] [address]    Report Status PENDING  Incomplete  Blood Culture (routine x 2)     Status: None (Preliminary result)   Collection Time: 06/29/16  7:17 PM  Result Value Ref Range Status   Specimen Description BLOOD R HAND  Final   Special Requests Blood Culture adequate volume  Final   Culture NO GROWTH 2 DAYS  Final   Report Status PENDING  Incomplete  Urine culture     Status: Abnormal (Preliminary result)   Collection Time: 06/29/16  7:17 PM  Result Value Ref Range Status   Specimen Description URINE, RANDOM  Final   Special Requests NONE  Final   Culture (A)  Final    >=100,000 COLONIES/mL GRAM NEGATIVE RODS IDENTIFICATION AND SUSCEPTIBILITIES TO FOLLOW Performed at [facility], [address], [place],  [address]    Report Status PENDING  Incomplete  Blood Culture ID Panel (Reflexed)     Status: Abnormal   Collection Time: 06/29/16  7:17 PM  Result Value Ref Range Status   Enterococcus species NOT DETECTED NOT DETECTED Final   Listeria monocytogenes NOT DETECTED NOT DETECTED  Final   Staphylococcus species NOT DETECTED NOT DETECTED Final   Staphylococcus aureus NOT DETECTED NOT DETECTED Final   Streptococcus species NOT DETECTED NOT DETECTED Final   Streptococcus agalactiae NOT DETECTED NOT DETECTED Final   Streptococcus pneumoniae NOT DETECTED NOT DETECTED Final   Streptococcus pyogenes NOT DETECTED NOT DETECTED Final   Acinetobacter baumannii NOT DETECTED NOT DETECTED Final   Enterobacteriaceae species DETECTED (A) NOT DETECTED Final    Comment: Enterobacteriaceae represent a large family of gram-negative bacteria, not a single organism. CRITICAL RESULT CALLED TO, READ BACK BY AND VERIFIED WITH: HANK ZOMPA AT 8676 ON 06/30/16 ALV    Enterobacter cloacae complex NOT DETECTED NOT DETECTED Final   Escherichia coli NOT DETECTED NOT DETECTED Final   Klebsiella oxytoca NOT DETECTED NOT DETECTED Final   Klebsiella pneumoniae DETECTED (A) NOT DETECTED Final    Comment: CRITICAL RESULT CALLED TO, READ BACK BY AND VERIFIED WITH: HANK ZOMPA AT 1246 ON 06/30/16 ALV    Proteus species NOT DETECTED NOT DETECTED Final   Serratia marcescens NOT DETECTED NOT DETECTED Final   Carbapenem resistance NOT DETECTED NOT DETECTED Final   Haemophilus influenzae NOT DETECTED NOT DETECTED Final   Neisseria meningitidis NOT DETECTED NOT DETECTED Final   Pseudomonas aeruginosa NOT DETECTED NOT DETECTED Final   Candida albicans NOT DETECTED NOT DETECTED Final   Candida glabrata NOT DETECTED NOT DETECTED Final   Candida krusei NOT DETECTED NOT DETECTED Final   Candida parapsilosis NOT DETECTED NOT DETECTED Final   Candida tropicalis NOT DETECTED NOT DETECTED Final    Coagulation Studies: No results for input(s): LABPROT, INR in the last 72 hours.  Urinalysis:  Recent Labs  06/29/16 Levy 1.009  PHURINE 6.0  GLUCOSEU NEGATIVE  HGBUR SMALL*  BILIRUBINUR NEGATIVE  KETONESUR NEGATIVE  PROTEINUR NEGATIVE  NITRITE NEGATIVE  LEUKOCYTESUR LARGE*       Imaging: Ct Abdomen Pelvis Wo Contrast  Result Date: 06/29/2016 CLINICAL DATA:  66 year old female with abdominal and pelvic pain with fever. History of kidney transplant. EXAM: CT ABDOMEN AND PELVIS WITHOUT CONTRAST TECHNIQUE: Multidetector CT imaging of the abdomen and pelvis was performed following the standard protocol without IV contrast. COMPARISON:  06/05/2015 FINDINGS: Please note that parenchymal abnormalities may be missed without intravenous contrast. Lower chest: A moderate pericardial effusion has slightly increased since 2017. Cardiomegaly again noted. Hepatobiliary: The liver is unremarkable. The patient is status post cholecystectomy. Mild intrahepatic and CBD dilatation noted to the ampulla without definite obstructing cause. Pancreas: Unremarkable Spleen: Unremarkable Adrenals/Urinary Tract: A right pelvic renal transplant identified and has a similar appearance to 2017. Atrophic native kidneys noted. The adrenal glands and bladder are unremarkable. Stomach/Bowel: A definite bowel abnormality identified. No definite bowel  wall thickening or bowel dilatation. Vascular/Lymphatic: Aortic atherosclerotic calcifications noted without aneurysm. No enlarged lymph nodes identified. Reproductive: Status post hysterectomy. No adnexal masses. Other: No ascites, focal collection or pneumoperitoneum. Musculoskeletal: No acute or significant osseous findings. IMPRESSION: Enlarging moderate pericardial effusion.  Cardiomegaly again noted. Mild intrahepatic and CBD dilatation to the ampulla without definite obstructing cause. Aortic atherosclerotic calcifications. Electronically Signed   By: Margarette Canada M.D.   On: 06/29/2016 21:41   Dg Chest Port 1 View  Result Date: 06/29/2016 CLINICAL DATA:  Initial evaluation for acute fever. EXAM: PORTABLE CHEST 1 VIEW COMPARISON:  Prior radiograph from February 03, 2015. FINDINGS: Fairly pronounced cardiomegaly, slightly progressed from previous. Mediastinal silhouette  normal. Aortic atherosclerosis. Lungs normally inflated. No focal infiltrates. No pulmonary edema or pleural effusion. No pneumothorax. No acute osseus abnormality. IMPRESSION: 1. No active cardiopulmonary disease. 2. Pronounced cardiomegaly, slightly worsened relative to 2016. Electronically Signed   By: Jeannine Boga M.D.   On: 06/29/2016 19:39     Medications:   . cefTRIAXone (ROCEPHIN)  IV Stopped (06/30/16 1625)   . atorvastatin  40 mg Oral QHS  . citalopram  20 mg Oral Daily  . donepezil  5 mg Oral Daily  . heparin  5,000 Units Subcutaneous Q8H  . losartan  25 mg Oral Daily  . metoprolol succinate  25 mg Oral Daily  . mycophenolate  180 mg Oral BID  . predniSONE  5 mg Oral Q breakfast  . sirolimus  3 mg Oral Daily   acetaminophen **OR** acetaminophen, diphenhydrAMINE, ondansetron **OR** ondansetron (ZOFRAN) IV  Assessment/ Plan:  66 y.o. 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  1.  S/p renal transplantation. 2.  Chronic kidney disease stage III 3.  Hypertension. 4.  Urinary tract infection  Plan:  Patient seen at bedside. Her status appears to be improving. Renal function has slightly improved.  We will continue the patient on her current immunosuppression of seromas, mycophenolate, and prednisone. Antibiotic management as per hospitalist. Klebsiella noted in the urine and blood. She will need at least 2 weeks of antibiotic therapy. Continue to monitor renal function while here.   LOS: 2 Greer Koeppen 5/18/201811:33 AM

## 2016-07-01 NOTE — Consult Note (Signed)
[facility] Consultation Note  Patient ID: [name], MRN: [id number], DOB/AGE: 1950/11/07 66 y.o. Admit date: 06/29/2016   Date of Consult: 07/01/2016 Primary Physician: [facility] Primary Cardiologist:Paraschos  Chief Complaint:  Chief Complaint  Patient presents with  . Abdominal Pain   Reason for Consult: pericardial effusion  HPI: 66 y.o. female with the known essential hypertension makes hyperlipidemia status post kidney transplant who is recently admitted for significant Klebsiella bacteremia of unknown etiology. The patient is on appropriate medication management and is slowly improving at this time. She did feel weak and fatigued when admitted to the hospital and has had some improvements. She has had normal LV function by echocardiogram in the recent past with a small pericardial effusion. Repeat echocardiogram at this time shows normal ejection fraction and normal function with no evidence of significant valvular heart disease and a moderate pericardial effusion. This is not showing any evidence of hemodynamic compromise or tamponade. The patient is on appropriate medication management for her hyperlipidemia and blood pressure is well controlled on metoprolol and losartan at this time. There is no apparent reason or significant need for further investigation or treatment options of pericardial effusion other than surveillance at this time  Past Medical History:  Diagnosis Date  . Anemia   . Cancer ([place])   . Dementia   . GERD (gastroesophageal reflux disease)   . Hypertension   . Renal disorder       Surgical History:  Past Surgical History:  Procedure Laterality Date  . ABDOMINAL HYSTERECTOMY    . AV FISTULA PLACEMENT    . BREAST BIOPSY     Patient is unsure but she thinks she remember a Bx  . KIDNEY TRANSPLANT    . NEPHRECTOMY TRANSPLANTED ORGAN       Home Meds: Prior to Admission medications   Medication Sig Start Date End  Date Taking? Authorizing Provider  atorvastatin (LIPITOR) 40 MG tablet Take 40 mg by mouth at bedtime.   Yes [provider]  calcitRIOL (ROCALTROL) 0.25 MCG capsule Take 0.25 mcg by mouth daily.   Yes [provider]  citalopram (CELEXA) 20 MG tablet Take 20 mg by mouth daily.   Yes [provider]  donepezil (ARICEPT) 5 MG tablet Take 5 mg by mouth daily. 04/05/16  Yes [provider]  folic acid (FOLVITE) 1 MG tablet Take 1 mg by mouth 2 (two) times daily.    Yes [provider]  furosemide (LASIX) 20 MG tablet Take 20 mg by mouth daily.   Yes [provider]  losartan (COZAAR) 25 MG tablet Take 25 mg by mouth daily.   Yes [provider]  mycophenolate (MYFORTIC) 180 MG EC tablet Take 180 mg by mouth 2 (two) times daily.   Yes [provider]  potassium chloride SA (K-DUR,KLOR-CON) 20 MEQ tablet Take 20 mEq by mouth daily. 05/13/16  Yes [provider]  predniSONE (DELTASONE) 5 MG tablet Take 5 mg by mouth daily with breakfast.    Yes [provider]  sirolimus (RAPAMUNE) 1 MG tablet Take 3 mg by mouth daily.    Yes [provider]  HYDROcodone-acetaminophen (NORCO/VICODIN) 5-325 MG tablet Take 1 tablet by mouth every 6 (six) hours as needed for moderate pain. Patient not taking: Reported on 06/29/2016 02/01/15   Johnn Hai, PA-C  metoprolol succinate (TOPROL-XL) 25 MG 24 hr tablet Take 25 mg by mouth daily.    [provider]  phenazopyridine (PYRIDIUM) 200  MG tablet Take 1 tablet (200 mg total) by mouth 3 (three) times daily as needed for pain. Patient not taking: Reported on 06/29/2016 08/21/15 08/20/16  Beers, Pierce Crane, PA-C  sulfamethoxazole-trimethoprim (BACTRIM DS,SEPTRA DS) 800-160 MG tablet Take 1 tablet by mouth 2 (two) times daily. Patient not taking: Reported on 06/29/2016 08/21/15   Arlyss Repress, PA-C    Inpatient Medications:  . atorvastatin  40 mg Oral QHS  .  citalopram  20 mg Oral Daily  . donepezil  5 mg Oral Daily  . heparin  5,000 Units Subcutaneous Q8H  . losartan  25 mg Oral Daily  . metoprolol succinate  25 mg Oral Daily  . mycophenolate  180 mg Oral BID  . potassium chloride  40 mEq Oral Once  . predniSONE  5 mg Oral Q breakfast  . sirolimus  3 mg Oral Daily   . cefTRIAXone (ROCEPHIN)  IV 2 g (07/01/16 1613)    Allergies:  Allergies  Allergen Reactions  . Ace Inhibitors Cough  . Lisinopril Cough    Social History   Social History  . Marital status: Married    Spouse name: N/A  . Number of children: N/A  . Years of education: N/A   Occupational History  . Not on file.   Social History Main Topics  . Smoking status: Former Research scientist (life sciences)  . Smokeless tobacco: Never Used  . Alcohol use No  . Drug use: No  . Sexual activity: Not Currently   Other Topics Concern  . Not on file   Social History Narrative  . No narrative on file     Family History  Problem Relation Age of Onset  . Stroke Mother   . Breast cancer Neg Hx      Review of Systems Positive forWeakness fatigue Negative for: General:  chills, fever, night sweats or weight changes.  Cardiovascular: PND orthopnea syncope dizziness  Dermatological skin lesions rashes Respiratory: Cough congestion Urologic: Frequent urination urination at night and hematuria Abdominal: negative for nausea, vomiting, diarrhea, bright red blood per rectum, melena, or hematemesis Neurologic: negative for visual changes, and/or hearing changes  All other systems reviewed and are otherwise negative except as noted above.  Labs:  Recent Labs  06/29/16 1916  TROPONINI <0.03   Lab Results  Component Value Date   WBC 14.6 (H) 06/30/2016   HGB 10.1 (L) 06/30/2016   HCT 30.2 (L) 06/30/2016   MCV 76.2 (L) 06/30/2016   PLT 253 06/30/2016    Recent Labs Lab 06/29/16 1916  07/01/16 0816  NA 135  < > 137  K 3.4*  < > 3.3*  CL 97*  < > 103  CO2 28  < > 26  BUN 21*  < > 17   CREATININE 1.63*  < > 1.48*  CALCIUM 8.3*  < > 8.1*  PROT 7.7  --   --   BILITOT 0.5  --   --   ALKPHOS 56  --   --   ALT 14  --   --   AST 18  --   --   GLUCOSE 120*  < > 101*  < > = values in this interval not displayed. No results found for: CHOL, HDL, LDLCALC, TRIG No results found for: DDIMER  Radiology/Studies:  Ct Abdomen Pelvis Wo Contrast  Result Date: 06/29/2016 CLINICAL DATA:  66 year old female with abdominal and pelvic pain with fever. History of kidney transplant. EXAM: CT ABDOMEN AND PELVIS WITHOUT CONTRAST TECHNIQUE: Multidetector CT imaging  of the abdomen and pelvis was performed following the standard protocol without IV contrast. COMPARISON:  06/05/2015 FINDINGS: Please note that parenchymal abnormalities may be missed without intravenous contrast. Lower chest: A moderate pericardial effusion has slightly increased since 2017. Cardiomegaly again noted. Hepatobiliary: The liver is unremarkable. The patient is status post cholecystectomy. Mild intrahepatic and CBD dilatation noted to the ampulla without definite obstructing cause. Pancreas: Unremarkable Spleen: Unremarkable Adrenals/Urinary Tract: A right pelvic renal transplant identified and has a similar appearance to 2017. Atrophic native kidneys noted. The adrenal glands and bladder are unremarkable. Stomach/Bowel: A definite bowel abnormality identified. No definite bowel wall thickening or bowel dilatation. Vascular/Lymphatic: Aortic atherosclerotic calcifications noted without aneurysm. No enlarged lymph nodes identified. Reproductive: Status post hysterectomy. No adnexal masses. Other: No ascites, focal collection or pneumoperitoneum. Musculoskeletal: No acute or significant osseous findings. IMPRESSION: Enlarging moderate pericardial effusion.  Cardiomegaly again noted. Mild intrahepatic and CBD dilatation to the ampulla without definite obstructing cause. Aortic atherosclerotic calcifications. Electronically Signed   By:  Margarette Canada M.D.   On: 06/29/2016 21:41   Dg Chest Port 1 View  Result Date: 06/29/2016 CLINICAL DATA:  Initial evaluation for acute fever. EXAM: PORTABLE CHEST 1 VIEW COMPARISON:  Prior radiograph from 02-23-2015. FINDINGS: Fairly pronounced cardiomegaly, slightly progressed from previous. Mediastinal silhouette normal. Aortic atherosclerosis. Lungs normally inflated. No focal infiltrates. No pulmonary edema or pleural effusion. No pneumothorax. No acute osseus abnormality. IMPRESSION: 1. No active cardiopulmonary disease. 2. Pronounced cardiomegaly, slightly worsened relative to 2016. Electronically Signed   By: Jeannine Boga M.D.   On: 06/29/2016 19:39    EKG: Normal sinus rhythm  Weights: Filed Weights   06/29/16 1854 06/29/16 2323  Weight: 86.2 kg (190 lb) 92.6 kg (204 lb 1.6 oz)     Physical Exam: Blood pressure 125/68, pulse 66, temperature 98.4 F (36.9 C), temperature source Oral, resp. rate 20, height 4\' 11"  (1.499 m), weight 92.6 kg (204 lb 1.6 oz), SpO2 98 %. Body mass index is 41.22 kg/m. General: Well developed, well nourished, in no acute distress. Head eyes ears nose throat: Normocephalic, atraumatic, sclera non-icteric, no xanthomas, nares are without discharge. No apparent thyromegaly and/or mass  Lungs: Normal respiratory effort.  no wheezes, no rales, no rhonchi.  Heart: RRR with normal S1 S2. no murmur gallop, no rub, PMI is normal size and placement, carotid upstroke normal without bruit, jugular venous pressure is normal Abdomen: Soft, non-tender, non-distended with normoactive bowel sounds. No hepatomegaly. No rebound/guarding. No obvious abdominal masses. Abdominal aorta is normal size without bruit Extremities: No edema. no cyanosis, no clubbing, no ulcers  Peripheral : 2+ bilateral upper extremity pulses, 2+ bilateral femoral pulses, 2+ bilateral dorsal pedal pulse Neuro: Alert and oriented. No facial asymmetry. No focal deficit. Moves all extremities  spontaneously. Musculoskeletal: Normal muscle tone without kyphosis Psych:  Responds to questions appropriately with a normal affect.    Assessment: 66 year old female with essential hypertension mixed hyperlipidemia kidney transplant with small pericardial effusion increasing into a moderate pericardial effusion without evidence of tamponade   Plan: 1. Continue medication management for hypertension control without change today 2. Continue supportive care and treatment of bacteremia which may contribute to slightly larger pericardial effusion although no current significant concerns of hemodynamic compromise 3. High intensity cholesterol therapy 4. No further cardiac diagnostics necessary at this time 5. Okay for discharge home from cardiac standpoint with follow-up of pericardial effusion as outpatient  Signed, Corey Skains M.D. [facility] 07/01/2016, 4:58 PM

## 2016-07-01 NOTE — Care Management Important Message (Signed)
Important Message  Patient Details  Name: [name] MRN: [id number] Date of Birth: 02-Sep-1950   Medicare Important Message Given:  Yes    Beverly Sessions, RN 07/01/2016, 4:02 PM

## 2016-07-02 LAB — CBC
HCT: 27.6 % — ABNORMAL LOW (ref 35.0–47.0)
Hemoglobin: 9.2 g/dL — ABNORMAL LOW (ref 12.0–16.0)
MCH: 25.3 pg — AB (ref 26.0–34.0)
MCHC: 33.5 g/dL (ref 32.0–36.0)
MCV: 75.6 fL — ABNORMAL LOW (ref 80.0–100.0)
PLATELETS: 235 10*3/uL (ref 150–440)
RBC: 3.65 MIL/uL — ABNORMAL LOW (ref 3.80–5.20)
RDW: 14.9 % — AB (ref 11.5–14.5)
WBC: 6.6 10*3/uL (ref 3.6–11.0)

## 2016-07-02 LAB — BASIC METABOLIC PANEL
Anion gap: 10 (ref 5–15)
BUN: 19 mg/dL (ref 6–20)
CO2: 25 mmol/L (ref 22–32)
CREATININE: 1.41 mg/dL — AB (ref 0.44–1.00)
Calcium: 8.2 mg/dL — ABNORMAL LOW (ref 8.9–10.3)
Chloride: 104 mmol/L (ref 101–111)
GFR calc Af Amer: 44 mL/min — ABNORMAL LOW (ref >60)
GFR calc non Af Amer: 38 mL/min — ABNORMAL LOW (ref >60)
Glucose, Bld: 96 mg/dL (ref 65–99)
Potassium: 3.4 mmol/L — ABNORMAL LOW (ref 3.5–5.1)
SODIUM: 139 mmol/L (ref 135–145)

## 2016-07-02 LAB — CULTURE, BLOOD (ROUTINE X 2)

## 2016-07-02 LAB — C DIFFICILE QUICK SCREEN W PCR REFLEX
C DIFFICILE (CDIFF) TOXIN: NEGATIVE
C DIFFICLE (CDIFF) ANTIGEN: NEGATIVE
C Diff interpretation: NOT DETECTED

## 2016-07-02 LAB — URINE CULTURE: Culture: >=100000 — AB

## 2016-07-02 MED ORDER — DIPHENOXYLATE-ATROPINE 2.5-0.025 MG PO TABS: 1.0000 | ORAL_TABLET | Freq: Four times a day (QID) | ORAL | 1 refills | Status: DC | PRN

## 2016-07-02 MED ORDER — CEPHALEXIN 500 MG PO CAPS: 500.0000 mg | ORAL_CAPSULE | Freq: Three times a day (TID) | ORAL | 0 refills | Status: AC

## 2016-07-02 MED ORDER — METOPROLOL SUCCINATE ER 25 MG PO TB24: 25.0000 mg | ORAL_TABLET | Freq: Every day | ORAL | 2 refills | Status: DC

## 2016-07-02 NOTE — Progress Notes (Signed)
[facility]  ROUNDING NOTE   Subjective:  Renal function appears to have stabilized. Creatinine currently 1.4. Patient in good spirits at the moment.  Objective:  Vital signs in last 24 hours:  Temp:  [97.7 F (36.5 C)-98.1 F (36.7 C)] 97.7 F (36.5 C) (05/19 1210) Pulse Rate:  [64-66] 64 (05/19 1210) Resp:  [17-20] 17 (05/19 1210) BP: (131-138)/(46-68) 138/68 (05/19 1210) SpO2:  [94 %-100 %] 100 % (05/19 1210)  Weight change:  Filed Weights   06/29/16 1854 06/29/16 2323  Weight: 86.2 kg (190 lb) 92.6 kg (204 lb 1.6 oz)    Intake/Output: I/O last 3 completed shifts: In: 460 [P.O.:360; IV Piggyback:100] Out: -    Intake/Output this shift:  No intake/output data recorded.  Physical Exam: General: No acute distress  Head: Normocephalic, atraumatic. Moist oral mucosal membranes  Eyes: Anicteric  Neck: Supple, trachea midline  Lungs:  Clear to auscultation, normal effort  Heart: S1S2 no rubs  Abdomen:  Soft, nontender, bowel sounds present, no graft tenderness  Extremities: trace peripheral edema.  Neurologic: Awake, alert, following commands  Skin: No lesions       Basic Metabolic Panel:  Recent Labs Lab 06/29/16 1916 06/30/16 0408 07/01/16 0816 07/02/16 0430  NA 135 139 137 139  K 3.4* 3.1* 3.3* 3.4*  CL 97* 101 103 104  CO2 28 26 26 25   GLUCOSE 120* 153* 101* 96  BUN 21* 18 17 19   CREATININE 1.63* 1.78* 1.48* 1.41*  CALCIUM 8.3* 8.1* 8.1* 8.2*    Liver Function Tests:  Recent Labs Lab 06/29/16 1916  AST 18  ALT 14  ALKPHOS 56  BILITOT 0.5  PROT 7.7  ALBUMIN 3.2*    Recent Labs Lab 06/29/16 1916  LIPASE 17   No results for input(s): AMMONIA in the last 168 hours.  CBC:  Recent Labs Lab 06/29/16 1916 06/30/16 0408 07/02/16 0430  WBC 10.9 14.6* 6.6  NEUTROABS 9.0*  --   --   HGB 10.2* 10.1* 9.2*  HCT 31.4* 30.2* 27.6*  MCV 77.4* 76.2* 75.6*  PLT 276 253 235    Cardiac Enzymes:  Recent Labs Lab  06/29/16 1916  TROPONINI <0.03    BNP: Invalid input(s): POCBNP  CBG: No results for input(s): GLUCAP in the last 168 hours.  Microbiology: Results for orders placed or performed during the hospital encounter of 06/29/16  Blood Culture (routine x 2)     Status: Abnormal   Collection Time: 06/29/16  7:17 PM  Result Value Ref Range Status   Specimen Description BLOOD R AC  Final   Special Requests   Final    Blood Culture results may not be optimal due to an excessive volume of blood received in culture bottles   Culture  Setup Time   Final    GRAM NEGATIVE RODS IN BOTH AEROBIC AND ANAEROBIC BOTTLES CRITICAL RESULT CALLED TO, READ BACK BY AND VERIFIED WITH: HANK ZOMPA AT 4599 ON 06/30/16 ALV Performed at [facility], [place] [address] [place], [place] [address]    Culture KLEBSIELLA PNEUMONIAE (A)  Final   Report Status 07/02/2016 FINAL  Final   Organism ID, Bacteria KLEBSIELLA PNEUMONIAE  Final      Susceptibility   Klebsiella pneumoniae - MIC*    AMPICILLIN >=32 RESISTANT Resistant     CEFAZOLIN <=4 SENSITIVE Sensitive     CEFEPIME <=1 SENSITIVE Sensitive     CEFTAZIDIME <=1 SENSITIVE Sensitive     CEFTRIAXONE <=1 SENSITIVE Sensitive  CIPROFLOXACIN <=0.25 SENSITIVE Sensitive     GENTAMICIN <=1 SENSITIVE Sensitive     IMIPENEM <=0.25 SENSITIVE Sensitive     TRIMETH/SULFA <=20 SENSITIVE Sensitive     AMPICILLIN/SULBACTAM 4 SENSITIVE Sensitive     PIP/TAZO <=4 SENSITIVE Sensitive     Extended ESBL NEGATIVE Sensitive     * KLEBSIELLA PNEUMONIAE  Blood Culture (routine x 2)     Status: None (Preliminary result)   Collection Time: 06/29/16  7:17 PM  Result Value Ref Range Status   Specimen Description BLOOD R HAND  Final   Special Requests Blood Culture adequate volume  Final   Culture NO GROWTH 3 DAYS  Final   Report Status PENDING  Incomplete  Urine culture     Status: Abnormal   Collection Time: 06/29/16  7:17 PM  Result Value Ref Range Status   Specimen  Description URINE, RANDOM  Final   Special Requests NONE  Final   Culture >=100,000 COLONIES/mL KLEBSIELLA PNEUMONIAE (A)  Final   Report Status 07/02/2016 FINAL  Final   Organism ID, Bacteria KLEBSIELLA PNEUMONIAE (A)  Final      Susceptibility   Klebsiella pneumoniae - MIC*    AMPICILLIN RESISTANT Resistant     CEFAZOLIN <=4 SENSITIVE Sensitive     CEFTRIAXONE <=1 SENSITIVE Sensitive     CIPROFLOXACIN <=0.25 SENSITIVE Sensitive     GENTAMICIN <=1 SENSITIVE Sensitive     IMIPENEM <=0.25 SENSITIVE Sensitive     NITROFURANTOIN <=16 SENSITIVE Sensitive     TRIMETH/SULFA <=20 SENSITIVE Sensitive     AMPICILLIN/SULBACTAM 4 SENSITIVE Sensitive     PIP/TAZO <=4 SENSITIVE Sensitive     Extended ESBL NEGATIVE Sensitive     * >=100,000 COLONIES/mL KLEBSIELLA PNEUMONIAE  Blood Culture ID Panel (Reflexed)     Status: Abnormal   Collection Time: 06/29/16  7:17 PM  Result Value Ref Range Status   Enterococcus species NOT DETECTED NOT DETECTED Final   Listeria monocytogenes NOT DETECTED NOT DETECTED Final   Staphylococcus species NOT DETECTED NOT DETECTED Final   Staphylococcus aureus NOT DETECTED NOT DETECTED Final   Streptococcus species NOT DETECTED NOT DETECTED Final   Streptococcus agalactiae NOT DETECTED NOT DETECTED Final   Streptococcus pneumoniae NOT DETECTED NOT DETECTED Final   Streptococcus pyogenes NOT DETECTED NOT DETECTED Final   Acinetobacter baumannii NOT DETECTED NOT DETECTED Final   Enterobacteriaceae species DETECTED (A) NOT DETECTED Final    Comment: Enterobacteriaceae represent a large family of gram-negative bacteria, not a single organism. CRITICAL RESULT CALLED TO, READ BACK BY AND VERIFIED WITH: HANK ZOMPA AT 1856 ON 06/30/16 ALV    Enterobacter cloacae complex NOT DETECTED NOT DETECTED Final   Escherichia coli NOT DETECTED NOT DETECTED Final   Klebsiella oxytoca NOT DETECTED NOT DETECTED Final   Klebsiella pneumoniae DETECTED (A) NOT DETECTED Final    Comment:  CRITICAL RESULT CALLED TO, READ BACK BY AND VERIFIED WITH: HANK ZOMPA AT 1246 ON 06/30/16 ALV    Proteus species NOT DETECTED NOT DETECTED Final   Serratia marcescens NOT DETECTED NOT DETECTED Final   Carbapenem resistance NOT DETECTED NOT DETECTED Final   Haemophilus influenzae NOT DETECTED NOT DETECTED Final   Neisseria meningitidis NOT DETECTED NOT DETECTED Final   Pseudomonas aeruginosa NOT DETECTED NOT DETECTED Final   Candida albicans NOT DETECTED NOT DETECTED Final   Candida glabrata NOT DETECTED NOT DETECTED Final   Candida krusei NOT DETECTED NOT DETECTED Final   Candida parapsilosis NOT DETECTED NOT DETECTED Final   Candida tropicalis  NOT DETECTED NOT DETECTED Final  C difficile quick scan w PCR reflex     Status: None   Collection Time: 07/02/16  7:12 AM  Result Value Ref Range Status   C Diff antigen NEGATIVE NEGATIVE Final   C Diff toxin NEGATIVE NEGATIVE Final   C Diff interpretation No C. difficile detected.  Final    Coagulation Studies: No results for input(s): LABPROT, INR in the last 72 hours.  Urinalysis:  Recent Labs  06/29/16 1917  COLORURINE YELLOW*  LABSPEC 1.009  PHURINE 6.0  GLUCOSEU NEGATIVE  HGBUR SMALL*  BILIRUBINUR NEGATIVE  KETONESUR NEGATIVE  PROTEINUR NEGATIVE  NITRITE NEGATIVE  LEUKOCYTESUR LARGE*      Imaging: No results found.   Medications:   . cefTRIAXone (ROCEPHIN)  IV Stopped (07/01/16 1643)   . atorvastatin  40 mg Oral QHS  . citalopram  20 mg Oral Daily  . donepezil  5 mg Oral Daily  . heparin  5,000 Units Subcutaneous Q8H  . losartan  25 mg Oral Daily  . metoprolol succinate  25 mg Oral Daily  . mycophenolate  180 mg Oral BID  . predniSONE  5 mg Oral Q breakfast  . sirolimus  3 mg Oral Daily   acetaminophen **OR** acetaminophen, diphenhydrAMINE, ondansetron **OR** ondansetron (ZOFRAN) IV  Assessment/ Plan:  66 y.o. 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, left renal cell carcinoma status post left nephrectomy, hypertension, dementia, history of cholecystectomy who presented with abdominal pain and felt to have urinary tract infection.  1.  S/p renal transplantation. 2.  Chronic kidney disease stage III 3.  Hypertension. 4.  Urinary tract infection 5.  Pericardial effusion evaluated by cardiology.  Plan:  Patient has improved significantly as compared to admission. Her renal function has stabilized with a creatinine of 1.4 which is close to her baseline as an outpatient. Recommend continuation of current doses of sirolimus, prednisone, and mycophenolate. Treatment of urinary tract infection as per hospitalist. She also has a pericardial effusion that will need outpatient follow-up through cardiology. Appreciate cardiology input during this admission.   LOS: 3 Peggy Loge 5/19/20181:27 PM

## 2016-07-03 LAB — GASTROINTESTINAL PANEL BY PCR, STOOL (REPLACES STOOL CULTURE)
ADENOVIRUS F40/41: NOT DETECTED
Astrovirus: NOT DETECTED
CRYPTOSPORIDIUM: NOT DETECTED
Campylobacter species: NOT DETECTED
Cyclospora cayetanensis: NOT DETECTED
ENTEROAGGREGATIVE E COLI (EAEC): NOT DETECTED
Entamoeba histolytica: NOT DETECTED
Enteropathogenic E coli (EPEC): NOT DETECTED
Enterotoxigenic E coli (ETEC): NOT DETECTED
GIARDIA LAMBLIA: NOT DETECTED
Norovirus GI/GII: NOT DETECTED
Plesimonas shigelloides: NOT DETECTED
Rotavirus A: NOT DETECTED
Salmonella species: NOT DETECTED
Sapovirus (I, II, IV, and V): NOT DETECTED
Shiga like toxin producing E coli (STEC): NOT DETECTED
Shigella/Enteroinvasive E coli (EIEC): NOT DETECTED
VIBRIO SPECIES: NOT DETECTED
Vibrio cholerae: NOT DETECTED
YERSINIA ENTEROCOLITICA: NOT DETECTED

## 2016-07-03 NOTE — Discharge Summary (Signed)
[facility] [name] NAME: [name]    MR#:  [id number]  DATE OF BIRTH:  [birth year]  DATE OF ADMISSION:  06/29/2016   ADMITTING PHYSICIAN: Lance Coon, MD  DATE OF DISCHARGE: 07/02/2016  2:07 PM  PRIMARY CARE PHYSICIAN: [facility] [place]   ADMISSION DIAGNOSIS:   Fever, unspecified fever cause [R50.9] Urinary tract infection without hematuria, site unspecified [N39.0]  DISCHARGE DIAGNOSIS:   Principal Problem:   Sepsis ([name]) Active Problems:   Essential hypertension   H/O kidney transplant   UTI (urinary tract infection)   GERD (gastroesophageal reflux disease)   Dementia   SECONDARY DIAGNOSIS:   Past Medical History:  Diagnosis Date  . Anemia   . Cancer ([name])   . Dementia   . GERD (gastroesophageal reflux disease)   . Hypertension   . Renal disorder     HOSPITAL COURSE:   66 year old female with past medical history significant for history of left renal cell carcinoma status post left nephrectomy, with end-stage renal disease now status post renal transplantation, hypertension, dementia presents to the hospital secondary to sepsis.  #1 sepsis-secondary to Klebsiella UTI and bacteremia. -Responded well to IV Rocephin. Being changed to Keflex at discharge. We'll continue to weeks of antibiotics. -Had diarrhea in the hospital. C. difficile was negative. When necessary anti-diarrheal medicines can be taken  #2 CK D stage III-baseline creatinine around 1.4. Appreciate nephrology consult. -Patient is status post renal cell transplantation. Follow-up with nephrology as outpatient. -Continue her immunosuppressants.  #3 moderate pericardial effusion-incidentally noted on CT and confirmed with echocardiogram. -Appreciate cardiology consult. Recommended outpatient follow-up.  #3 hypertension-on metoprolol and losartan  #4 dementia-seems to be at baseline. Continue outpatient medications with  Aricept  Ambulating well to the bathroom. Medically otherwise stable. Will be discharged today.  DISCHARGE CONDITIONS:   Guarded  CONSULTS OBTAINED:   Treatment Team:  Anthonette Legato, MD Corey Skains, MD  DRUG ALLERGIES:   Allergies  Allergen Reactions  . Ace Inhibitors Cough  . Lisinopril Cough   DISCHARGE MEDICATIONS:   Allergies as of 07/02/2016      Reactions   Ace Inhibitors Cough   Lisinopril Cough      Medication List    STOP taking these medications   furosemide 20 MG tablet Commonly known as:  LASIX   HYDROcodone-acetaminophen 5-325 MG tablet Commonly known as:  NORCO/VICODIN   phenazopyridine 200 MG tablet Commonly known as:  PYRIDIUM   potassium chloride SA 20 MEQ tablet Commonly known as:  K-DUR,KLOR-CON   sulfamethoxazole-trimethoprim 800-160 MG tablet Commonly known as:  BACTRIM DS,SEPTRA DS     TAKE these medications   atorvastatin 40 MG tablet Commonly known as:  LIPITOR Take 40 mg by mouth at bedtime.   calcitRIOL 0.25 MCG capsule Commonly known as:  ROCALTROL Take 0.25 mcg by mouth daily.   cephALEXin 500 MG capsule Commonly known as:  KEFLEX Take 1 capsule (500 mg total) by mouth 3 (three) times daily. X 12 MORE DAYS   citalopram 20 MG tablet Commonly known as:  CELEXA Take 20 mg by mouth daily.   diphenoxylate-atropine 2.5-0.025 MG tablet Commonly known as:  LOMOTIL Take 1 tablet by mouth 4 (four) times daily as needed for diarrhea or loose stools.   donepezil 5 MG tablet Commonly known as:  ARICEPT Take 5 mg by mouth daily.   folic acid 1 MG tablet Commonly known as:  FOLVITE Take 1 mg by mouth 2 (two)  times daily.   losartan 25 MG tablet Commonly known as:  COZAAR Take 25 mg by mouth daily.   metoprolol succinate 25 MG 24 hr tablet Commonly known as:  TOPROL-XL Take 1 tablet (25 mg total) by mouth daily.   mycophenolate 180 MG EC tablet Commonly known as:  MYFORTIC Take 180 mg by mouth 2 (two) times  daily.   predniSONE 5 MG tablet Commonly known as:  DELTASONE Take 5 mg by mouth daily with breakfast.   sirolimus 1 MG tablet Commonly known as:  RAPAMUNE Take 3 mg by mouth daily.        DISCHARGE INSTRUCTIONS:   1. PCP f/u in 1 week 2. Nephrology f/u in 2 weeks  DIET:   Cardiac diet  ACTIVITY:   Activity as tolerated  OXYGEN:   Home Oxygen: No.  Oxygen Delivery: room air  DISCHARGE LOCATION:   home   If you experience worsening of your admission symptoms, develop shortness of breath, life threatening emergency, suicidal or homicidal thoughts you must seek medical attention immediately by calling 911 or calling your MD immediately  if symptoms less severe.  You Must read complete instructions/literature along with all the possible adverse reactions/side effects for all the Medicines you take and that have been prescribed to you. Take any new Medicines after you have completely understood and accpet all the possible adverse reactions/side effects.   Please note  You were cared for by a hospitalist during your hospital stay. If you have any questions about your discharge medications or the care you received while you were in the hospital after you are discharged, you can call the unit and asked to speak with the hospitalist on call if the hospitalist that took care of you is not available. Once you are discharged, your primary care physician will handle any further medical issues. Please note that NO REFILLS for any discharge medications will be authorized once you are discharged, as it is imperative that you return to your primary care physician (or establish a relationship with a primary care physician if you do not have one) for your aftercare needs so that they can reassess your need for medications and monitor your lab values.    On the day of Discharge:  VITAL SIGNS:   Blood pressure 138/68, pulse 64, temperature 97.7 F (36.5 C), temperature source Oral, resp.  rate 17, height 4\' 11"  (1.499 m), weight 92.6 kg (204 lb 1.6 oz), SpO2 100 %.  PHYSICAL EXAMINATION:    GENERAL:  66 y.o.-year-old patient lying in the bed with no acute distress.  EYES: Pupils equal, round, reactive to light and accommodation. No scleral icterus. Extraocular muscles intact.  HEENT: Head atraumatic, normocephalic. Oropharynx and nasopharynx clear.  NECK:  Supple, no jugular venous distention. No thyroid enlargement, no tenderness.  LUNGS: Normal breath sounds bilaterally, no wheezing, rales,rhonchi or crepitation. No use of accessory muscles of respiration. Decreased bibasilar breath sounds. CARDIOVASCULAR: S1, S2 normal. No rubs, or gallops. 2/6 systolic murmur present. ABDOMEN: Soft, non-tender, non-distended. Bowel sounds present. No organomegaly or mass.  EXTREMITIES: No pedal edema, cyanosis, or clubbing.  NEUROLOGIC: Cranial nerves II through XII are intact. Muscle strength 5/5 in all extremities. Sensation intact. Gait not checked.  PSYCHIATRIC: The patient is alert and oriented x 2. Confusion noted, confabulation present- baseline SKIN: No obvious rash, lesion, or ulcer.   DATA REVIEW:   CBC  Recent Labs Lab 07/02/16 0430  WBC 6.6  HGB 9.2*  HCT 27.6*  PLT 235  Chemistries   Recent Labs Lab 06/29/16 1916  07/02/16 0430  NA 135  < > 139  K 3.4*  < > 3.4*  CL 97*  < > 104  CO2 28  < > 25  GLUCOSE 120*  < > 96  BUN 21*  < > 19  CREATININE 1.63*  < > 1.41*  CALCIUM 8.3*  < > 8.2*  AST 18  --   --   ALT 14  --   --   ALKPHOS 56  --   --   BILITOT 0.5  --   --   < > = values in this interval not displayed.   Microbiology Results  Results for orders placed or performed during the hospital encounter of 06/29/16  Blood Culture (routine x 2)     Status: Abnormal   Collection Time: 06/29/16  7:17 PM  Result Value Ref Range Status   Specimen Description BLOOD R AC  Final   Special Requests   Final    Blood Culture results may not be optimal due  to an excessive volume of blood received in culture bottles   Culture  Setup Time   Final    GRAM NEGATIVE RODS IN BOTH AEROBIC AND ANAEROBIC BOTTLES CRITICAL RESULT CALLED TO, READ BACK BY AND VERIFIED WITH: HANK ZOMPA AT 1245 ON 06/30/16 ALV Performed at [facility], [place] [address], [place], [place] [address]    Culture KLEBSIELLA PNEUMONIAE (A)  Final   Report Status 07/02/2016 FINAL  Final   Organism ID, Bacteria KLEBSIELLA PNEUMONIAE  Final      Susceptibility   Klebsiella pneumoniae - MIC*    AMPICILLIN >=32 RESISTANT Resistant     CEFAZOLIN <=4 SENSITIVE Sensitive     CEFEPIME <=1 SENSITIVE Sensitive     CEFTAZIDIME <=1 SENSITIVE Sensitive     CEFTRIAXONE <=1 SENSITIVE Sensitive     CIPROFLOXACIN <=0.25 SENSITIVE Sensitive     GENTAMICIN <=1 SENSITIVE Sensitive     IMIPENEM <=0.25 SENSITIVE Sensitive     TRIMETH/SULFA <=20 SENSITIVE Sensitive     AMPICILLIN/SULBACTAM 4 SENSITIVE Sensitive     PIP/TAZO <=4 SENSITIVE Sensitive     Extended ESBL NEGATIVE Sensitive     * KLEBSIELLA PNEUMONIAE  Blood Culture (routine x 2)     Status: None (Preliminary result)   Collection Time: 06/29/16  7:17 PM  Result Value Ref Range Status   Specimen Description BLOOD R HAND  Final   Special Requests Blood Culture adequate volume  Final   Culture NO GROWTH 4 DAYS  Final   Report Status PENDING  Incomplete  Urine culture     Status: Abnormal   Collection Time: 06/29/16  7:17 PM  Result Value Ref Range Status   Specimen Description URINE, RANDOM  Final   Special Requests NONE  Final   Culture >=100,000 COLONIES/mL KLEBSIELLA PNEUMONIAE (A)  Final   Report Status 07/02/2016 FINAL  Final   Organism ID, Bacteria KLEBSIELLA PNEUMONIAE (A)  Final      Susceptibility   Klebsiella pneumoniae - MIC*    AMPICILLIN RESISTANT Resistant     CEFAZOLIN <=4 SENSITIVE Sensitive     CEFTRIAXONE <=1 SENSITIVE Sensitive     CIPROFLOXACIN <=0.25 SENSITIVE Sensitive     GENTAMICIN <=1 SENSITIVE  Sensitive     IMIPENEM <=0.25 SENSITIVE Sensitive     NITROFURANTOIN <=16 SENSITIVE Sensitive     TRIMETH/SULFA <=20 SENSITIVE Sensitive     AMPICILLIN/SULBACTAM 4 SENSITIVE Sensitive  PIP/TAZO <=4 SENSITIVE Sensitive     Extended ESBL NEGATIVE Sensitive     * >=100,000 COLONIES/mL KLEBSIELLA PNEUMONIAE  Blood Culture ID Panel (Reflexed)     Status: Abnormal   Collection Time: 06/29/16  7:17 PM  Result Value Ref Range Status   Enterococcus species NOT DETECTED NOT DETECTED Final   Listeria monocytogenes NOT DETECTED NOT DETECTED Final   Staphylococcus species NOT DETECTED NOT DETECTED Final   Staphylococcus aureus NOT DETECTED NOT DETECTED Final   Streptococcus species NOT DETECTED NOT DETECTED Final   Streptococcus agalactiae NOT DETECTED NOT DETECTED Final   Streptococcus pneumoniae NOT DETECTED NOT DETECTED Final   Streptococcus pyogenes NOT DETECTED NOT DETECTED Final   Acinetobacter baumannii NOT DETECTED NOT DETECTED Final   Enterobacteriaceae species DETECTED (A) NOT DETECTED Final    Comment: Enterobacteriaceae represent a large family of gram-negative bacteria, not a single organism. CRITICAL RESULT CALLED TO, READ BACK BY AND VERIFIED WITH: HANK ZOMPA AT 1027 ON 06/30/16 ALV    Enterobacter cloacae complex NOT DETECTED NOT DETECTED Final   Escherichia coli NOT DETECTED NOT DETECTED Final   Klebsiella oxytoca NOT DETECTED NOT DETECTED Final   Klebsiella pneumoniae DETECTED (A) NOT DETECTED Final    Comment: CRITICAL RESULT CALLED TO, READ BACK BY AND VERIFIED WITH: HANK ZOMPA AT 1246 ON 06/30/16 ALV    Proteus species NOT DETECTED NOT DETECTED Final   Serratia marcescens NOT DETECTED NOT DETECTED Final   Carbapenem resistance NOT DETECTED NOT DETECTED Final   Haemophilus influenzae NOT DETECTED NOT DETECTED Final   Neisseria meningitidis NOT DETECTED NOT DETECTED Final   Pseudomonas aeruginosa NOT DETECTED NOT DETECTED Final   Candida albicans NOT DETECTED NOT DETECTED  Final   Candida glabrata NOT DETECTED NOT DETECTED Final   Candida krusei NOT DETECTED NOT DETECTED Final   Candida parapsilosis NOT DETECTED NOT DETECTED Final   Candida tropicalis NOT DETECTED NOT DETECTED Final  Gastrointestinal Panel by PCR , Stool     Status: None   Collection Time: 07/02/16  7:12 AM  Result Value Ref Range Status   Campylobacter species NOT DETECTED NOT DETECTED Final   Plesimonas shigelloides NOT DETECTED NOT DETECTED Final   Salmonella species NOT DETECTED NOT DETECTED Final   Yersinia enterocolitica NOT DETECTED NOT DETECTED Final   Vibrio species NOT DETECTED NOT DETECTED Final   Vibrio cholerae NOT DETECTED NOT DETECTED Final   Enteroaggregative E coli (EAEC) NOT DETECTED NOT DETECTED Final   Enteropathogenic E coli (EPEC) NOT DETECTED NOT DETECTED Final   Enterotoxigenic E coli (ETEC) NOT DETECTED NOT DETECTED Final   Shiga like toxin producing E coli (STEC) NOT DETECTED NOT DETECTED Final   Shigella/Enteroinvasive E coli (EIEC) NOT DETECTED NOT DETECTED Final   Cryptosporidium NOT DETECTED NOT DETECTED Final   Cyclospora cayetanensis NOT DETECTED NOT DETECTED Final   Entamoeba histolytica NOT DETECTED NOT DETECTED Final   Giardia lamblia NOT DETECTED NOT DETECTED Final   Adenovirus F40/41 NOT DETECTED NOT DETECTED Final   Astrovirus NOT DETECTED NOT DETECTED Final   Norovirus GI/GII NOT DETECTED NOT DETECTED Final   Rotavirus A NOT DETECTED NOT DETECTED Final   Sapovirus (I, II, IV, and V) NOT DETECTED NOT DETECTED Final  C difficile quick scan w PCR reflex     Status: None   Collection Time: 07/02/16  7:12 AM  Result Value Ref Range Status   C Diff antigen NEGATIVE NEGATIVE Final   C Diff toxin NEGATIVE NEGATIVE Final   C Diff  interpretation No C. difficile detected.  Final    RADIOLOGY:  No results found.   Management plans discussed with the patient, family and they are in agreement.  CODE STATUS:  Code Status History    Date Active Date  Inactive Code Status Order ID Comments User Context   06/29/2016 11:14 PM 07/02/2016  5:12 PM Full Code [id number]  Lance Coon, MD Inpatient   07/06/2014 10:01 PM 07/08/2014  3:37 PM Full Code [id number]  Hower, Aaron Mose, MD ED      TOTAL TIME TAKING CARE OF THIS PATIENT: 37 minutes.    Amye Grego M.D on 07/03/2016 at 12:15 PM  Between 7am to 6pm - Pager - [phone]  After 6pm go to [email or web address] - Technical brewer [facility]  Office  [phone]  CC: Primary care physician; [facility]   Note: This dictation was prepared with Dragon dictation along with smaller phrase technology. Any transcriptional errors that result from this process are unintentional.

## 2016-07-04 LAB — CULTURE, BLOOD (ROUTINE X 2)
Culture: NO GROWTH
Special Requests: ADEQUATE

## 2016-09-27 ENCOUNTER — Encounter: Payer: Self-pay | Admitting: *Deleted

## 2016-09-27 ENCOUNTER — Emergency Department
Admission: EM | Admit: 2016-09-27 | Discharge: 2016-09-27 | Disposition: A | Discharge status: Home / Self Care | Payer: Medicare Other | Attending: Emergency Medicine | Admitting: Emergency Medicine

## 2016-09-27 DIAGNOSIS — Z87891 Personal history of nicotine dependence: Secondary | ICD-10-CM | POA: Diagnosis not present

## 2016-09-27 DIAGNOSIS — W230XXA Caught, crushed, jammed, or pinched between moving objects, initial encounter: Secondary | ICD-10-CM | POA: Diagnosis not present

## 2016-09-27 DIAGNOSIS — Y929 Unspecified place or not applicable: Secondary | ICD-10-CM | POA: Insufficient documentation

## 2016-09-27 DIAGNOSIS — M79601 Pain in right arm: Secondary | ICD-10-CM

## 2016-09-27 DIAGNOSIS — I10 Essential (primary) hypertension: Secondary | ICD-10-CM | POA: Diagnosis not present

## 2016-09-27 DIAGNOSIS — Z79899 Other long term (current) drug therapy: Secondary | ICD-10-CM | POA: Diagnosis not present

## 2016-09-27 DIAGNOSIS — Y939 Activity, unspecified: Secondary | ICD-10-CM | POA: Insufficient documentation

## 2016-09-27 DIAGNOSIS — S4991XA Unspecified injury of right shoulder and upper arm, initial encounter: Secondary | ICD-10-CM | POA: Diagnosis present

## 2016-09-27 DIAGNOSIS — S40811A Abrasion of right upper arm, initial encounter: Secondary | ICD-10-CM | POA: Insufficient documentation

## 2016-09-27 DIAGNOSIS — Y999 Unspecified external cause status: Secondary | ICD-10-CM | POA: Insufficient documentation

## 2016-09-27 MED ORDER — ACETAMINOPHEN 325 MG PO TABS
650.0000 mg | ORAL_TABLET | Freq: Once | ORAL | Status: AC
  Administered 2016-09-27: 650 mg via ORAL
  Filled 2016-09-27: qty 2

## 2016-09-27 MED ORDER — BACITRACIN ZINC 500 UNIT/GM EX OINT
TOPICAL_OINTMENT | Freq: Once | CUTANEOUS | Status: DC
  Filled 2016-09-27: qty 0.9

## 2016-09-27 NOTE — ED Triage Notes (Signed)
Per EMS report, patient injured right arm when trying to catch her adult son when he fell.

## 2016-09-27 NOTE — ED Provider Notes (Signed)
[facility] Emergency Department Provider Note ____________________________________________  Time seen: 1914  I have reviewed the triage vital signs and the nursing notes.  HISTORY  Chief Complaint  Arm Pain  HPI [name] is a 66 y.o. female presents to the ED via EMS, for evaluation of injury sustained while attempting to catch her adult son when he was falling. The patient's son was transitioning from the transport [place] following his dialysis appointment today. He apparently tripped while on the lift, and the patient got caught between the lift and her son. She presents now with an abrasion to the right elbow, and a contusion to the right thigh. She denies any head injury, loss of consciousness, or other injury at this time.  Past Medical History:  Diagnosis Date  . Anemia   . Cancer ([place])   . Dementia   . GERD (gastroesophageal reflux disease)   . Hypertension   . Renal disorder     Patient Active Problem List   Diagnosis Date Noted  . Sepsis ([place]) 06/29/2016  . UTI (urinary tract infection) 06/29/2016  . GERD (gastroesophageal reflux disease) 06/29/2016  . Dementia 06/29/2016  . 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 Essential hypertension 07/06/2014  . H/O kidney transplant 07/06/2014    Past Surgical History:  Procedure Laterality Date  . ABDOMINAL HYSTERECTOMY    . AV FISTULA PLACEMENT    . BREAST BIOPSY     Patient is unsure but she thinks she remember a Bx  . KIDNEY TRANSPLANT    . NEPHRECTOMY TRANSPLANTED ORGAN      Prior to Admission medications   Medication Sig Start Date End Date Taking? Authorizing Provider  atorvastatin (LIPITOR) 40 MG tablet Take 40 mg by mouth at bedtime.    [provider]  calcitRIOL (ROCALTROL) 0.25 MCG capsule Take 0.25 mcg by mouth daily.    [provider]  citalopram (CELEXA) 20 MG tablet Take 20 mg by mouth daily.    [provider]  diphenoxylate-atropine (LOMOTIL)  2.5-0.025 MG tablet Take 1 tablet by mouth 4 (four) times daily as needed for diarrhea or loose stools. 07/02/16 07/02/17  Gladstone Lighter, MD  donepezil (ARICEPT) 5 MG tablet Take 5 mg by mouth daily. 04/05/16   [provider]  folic acid (FOLVITE) 1 MG tablet Take 1 mg by mouth 2 (two) times daily.     [provider]  losartan (COZAAR) 25 MG tablet Take 25 mg by mouth daily.    [provider]  metoprolol succinate (TOPROL-XL) 25 MG 24 hr tablet Take 1 tablet (25 mg total) by mouth daily. 07/02/16   Gladstone Lighter, MD  mycophenolate (MYFORTIC) 180 MG EC tablet Take 180 mg by mouth 2 (two) times daily.    [provider]  predniSONE (DELTASONE) 5 MG tablet Take 5 mg by mouth daily with breakfast.     [provider]  sirolimus (RAPAMUNE) 1 MG tablet Take 3 mg by mouth daily.     [provider]    Allergies Ace inhibitors and Lisinopril  Family History  Problem Relation Age of Onset  . Stroke Mother   . Breast cancer Neg Hx     Social History Social History  Substance Use Topics  . Smoking status: Former Research scientist (life sciences)  . Smokeless tobacco: Never Used  . Alcohol use No    Review of Systems  Constitutional: Negative for fever. Cardiovascular: Negative for chest pain. Respiratory: Negative for shortness of breath. Gastrointestinal: Negative for abdominal pain,  vomiting and diarrhea. Musculoskeletal: Negative for back pain.  Skin: Negative for rash. Right arm abrasion Neurological: Negative for headaches, focal weakness or numbness. ____________________________________________  PHYSICAL EXAM:  VITAL SIGNS: ED Triage Vitals  Enc Vitals Group     BP 09/27/16 1823 (!) 153/72     Pulse Rate 09/27/16 1823 60     Resp 09/27/16 1823 18     Temp 09/27/16 1823 98.3 F (36.8 C)     Temp Source 09/27/16 1823 Oral     SpO2 09/27/16 1823 97 %     Weight 09/27/16 1824 200 lb (90.7 kg)     Height 09/27/16 1824 4\' 11"  (1.499 m)      Head Circumference --      Peak Flow --      Pain Score 09/27/16 1823 7     Pain Loc --      Pain Edu? --      Excl. in [place]? --     Constitutional: Alert and oriented. Well appearing and in no distress. Head: Normocephalic and atraumatic. Eyes: Conjunctivae are normal. Normal extraocular movements Cardiovascular: Normal rate, regular rhythm. Normal distal pulses. Respiratory: Normal respiratory effort. No wheezes/rales/rhonchi. Musculoskeletal: Nontender with normal range of motion in all extremities. Normal composite fist bilaterally.  Neurologic:  Normal gait without ataxia. Normal speech and language. No gross focal neurologic deficits are appreciated. Skin:  Skin is warm, dry and intact. No rash noted. Superficial abrasion to the lateral right forearm.  ____________________________________________   RADIOLOGY  Not indicated. ____________________________________________  PROCEDURES  Tylenol 650 mg PO ____________________________________________  INITIAL IMPRESSION / ASSESSMENT AND PLAN / ED COURSE  Patient with ED evaluation of injury sustained following a mechanical fall. She is found to have a forearm abrasion and thigh contusion. Her exam is otherwise benign. She will be discharged with instructions to follow-up with her PCP and dose Tylenol as needed.  ____________________________________________  FINAL CLINICAL IMPRESSION(S) / ED DIAGNOSES  Final diagnoses:  Right arm pain  Abrasion of arm, right, initial encounter      Melvenia Needles, PA-C 09/27/16 2009    Nance Pear, MD 09/27/16 2129

## 2016-09-27 NOTE — Discharge Instructions (Signed)
Your exam is essentially normal following your fall. Take Tylenol as needed for pain relief. Follow-up with your provider for continued symptoms. Apply ice to reduce any pain and stiffness.

## 2016-12-08 ENCOUNTER — Other Ambulatory Visit: Payer: Self-pay | Admitting: Nurse Practitioner

## 2016-12-08 DIAGNOSIS — Z1239 Encounter for other screening for malignant neoplasm of breast: Secondary | ICD-10-CM

## 2017-02-12 ENCOUNTER — Other Ambulatory Visit: Payer: Self-pay

## 2017-02-12 ENCOUNTER — Emergency Department
Admission: EM | Admit: 2017-02-12 | Discharge: 2017-02-13 | Disposition: A | Discharge status: Home / Self Care | Payer: Medicare Other | Attending: Emergency Medicine | Admitting: Emergency Medicine

## 2017-02-12 DIAGNOSIS — R252 Cramp and spasm: Secondary | ICD-10-CM | POA: Diagnosis not present

## 2017-02-12 DIAGNOSIS — Z859 Personal history of malignant neoplasm, unspecified: Secondary | ICD-10-CM | POA: Insufficient documentation

## 2017-02-12 DIAGNOSIS — Z79899 Other long term (current) drug therapy: Secondary | ICD-10-CM | POA: Diagnosis not present

## 2017-02-12 DIAGNOSIS — M7918 Myalgia, other site: Secondary | ICD-10-CM | POA: Diagnosis not present

## 2017-02-12 DIAGNOSIS — I10 Essential (primary) hypertension: Secondary | ICD-10-CM | POA: Insufficient documentation

## 2017-02-12 DIAGNOSIS — Z87891 Personal history of nicotine dependence: Secondary | ICD-10-CM | POA: Diagnosis not present

## 2017-02-12 DIAGNOSIS — Z940 Kidney transplant status: Secondary | ICD-10-CM | POA: Diagnosis not present

## 2017-02-12 LAB — CK: CK TOTAL: 223 U/L (ref 38–234)

## 2017-02-12 LAB — URINALYSIS, COMPLETE (UACMP) WITH MICROSCOPIC
Bilirubin Urine: NEGATIVE
Glucose, UA: NEGATIVE mg/dL
Ketones, ur: NEGATIVE mg/dL
Nitrite: NEGATIVE
PROTEIN: NEGATIVE mg/dL
SPECIFIC GRAVITY, URINE: 1.02 (ref 1.005–1.030)
pH: 5 (ref 5.0–8.0)

## 2017-02-12 LAB — CBC
HCT: 35.8 % (ref 35.0–47.0)
Hemoglobin: 11.5 g/dL — ABNORMAL LOW (ref 12.0–16.0)
MCH: 25.3 pg — AB (ref 26.0–34.0)
MCHC: 32.2 g/dL (ref 32.0–36.0)
MCV: 78.7 fL — ABNORMAL LOW (ref 80.0–100.0)
PLATELETS: 193 10*3/uL (ref 150–440)
RBC: 4.55 MIL/uL (ref 3.80–5.20)
RDW: 15.8 % — AB (ref 11.5–14.5)
WBC: 5.1 10*3/uL (ref 3.6–11.0)

## 2017-02-12 LAB — BASIC METABOLIC PANEL
Anion gap: 10 (ref 5–15)
BUN: 28 mg/dL — AB (ref 6–20)
CALCIUM: 8.2 mg/dL — AB (ref 8.9–10.3)
CO2: 29 mmol/L (ref 22–32)
CREATININE: 1.54 mg/dL — AB (ref 0.44–1.00)
Chloride: 102 mmol/L (ref 101–111)
GFR calc Af Amer: 39 mL/min — ABNORMAL LOW (ref >60)
GFR, EST NON AFRICAN AMERICAN: 34 mL/min — AB (ref >60)
GLUCOSE: 125 mg/dL — AB (ref 65–99)
Potassium: 3.3 mmol/L — ABNORMAL LOW (ref 3.5–5.1)
SODIUM: 141 mmol/L (ref 135–145)

## 2017-02-12 LAB — MAGNESIUM: MAGNESIUM: 2 mg/dL (ref 1.7–2.4)

## 2017-02-12 NOTE — Discharge Instructions (Signed)
Please follow up with your primary care physician. Please ensure you are taking your medications and drinking adequate water. Please return with any worsened condition

## 2017-02-12 NOTE — ED Notes (Signed)
Pt reports muscle pain/ cramping since this morning. Pt states her hand and legs started cramping when she was cleaning. Pt is A/O x4 with husband at bedside. Awaiting EDP. Pt prefers to sit on chair and not bed

## 2017-02-12 NOTE — ED Provider Notes (Signed)
[facility] Emergency Department Provider Note   ____________________________________________   First MD Initiated Contact with Patient 02/12/17 2314     (approximate)  I have reviewed the triage vital signs and the nursing notes.   HISTORY  Chief Complaint Muscle Pain    HPI [name] is a 66 y.o. female who comes into the hospital today with cramps in her legs and hands which started earlier today.  The patient states that she was not doing anything but sitting when the symptoms started.  She did not do anything for the symptoms.  She has had this before and usually waits until it goes away but the patient spouse reports that she was inpatient today and came into the get checked out.  The patient states that her pain is a 0 out of 10 in intensity currently.  She has spoken to her doctor about it and she supposed be taking potassium and Tums daily.  The patient's past states that she does not always take her medicines.  She does have a history of a kidney transplant.  The patient has no other complaints at this time.   Past Medical History:  Diagnosis Date  . Anemia   . Cancer ([place])   . Dementia   . GERD (gastroesophageal reflux disease)   . Hypertension   . Renal disorder     Patient Active Problem List   Diagnosis Date Noted  . Sepsis ([place]) 06/29/2016  . UTI (urinary tract infection) 06/29/2016  . GERD (gastroesophageal reflux disease) 06/29/2016  . Dementia 06/29/2016  . 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 Essential hypertension 07/06/2014  . H/O kidney transplant 07/06/2014    Past Surgical History:  Procedure Laterality Date  . ABDOMINAL HYSTERECTOMY    . AV FISTULA PLACEMENT    . BREAST BIOPSY     Patient is unsure but she thinks she remember a Bx  . KIDNEY TRANSPLANT    . NEPHRECTOMY TRANSPLANTED ORGAN      Prior to Admission medications   Medication Sig Start Date End Date Taking? Authorizing Provider  atorvastatin  (LIPITOR) 40 MG tablet Take 40 mg by mouth at bedtime.    [provider]  calcitRIOL (ROCALTROL) 0.25 MCG capsule Take 0.25 mcg by mouth daily.    [provider]  citalopram (CELEXA) 20 MG tablet Take 20 mg by mouth daily.    [provider]  diphenoxylate-atropine (LOMOTIL) 2.5-0.025 MG tablet Take 1 tablet by mouth 4 (four) times daily as needed for diarrhea or loose stools. 07/02/16 07/02/17  Gladstone Lighter, MD  donepezil (ARICEPT) 5 MG tablet Take 5 mg by mouth daily. 04/05/16   [provider]  folic acid (FOLVITE) 1 MG tablet Take 1 mg by mouth 2 (two) times daily.     [provider]  losartan (COZAAR) 25 MG tablet Take 25 mg by mouth daily.    [provider]  metoprolol succinate (TOPROL-XL) 25 MG 24 hr tablet Take 1 tablet (25 mg total) by mouth daily. 07/02/16   Gladstone Lighter, MD  mycophenolate (MYFORTIC) 180 MG EC tablet Take 180 mg by mouth 2 (two) times daily.    [provider]  predniSONE (DELTASONE) 5 MG tablet Take 5 mg by mouth daily with breakfast.     [provider]  sirolimus (RAPAMUNE) 1 MG tablet Take 3 mg by mouth daily.     [provider]    Allergies Ace inhibitors and Lisinopril  Family History  Problem  Relation Age of Onset  . Stroke Mother   . Breast cancer Neg Hx     Social History Social History   Tobacco Use  . Smoking status: Former Research scientist (life sciences)  . Smokeless tobacco: Never Used  Substance Use Topics  . Alcohol use: No  . Drug use: No    Review of Systems  Constitutional: No fever/chills Eyes: No visual changes. ENT: No sore throat. Cardiovascular: Denies chest pain. Respiratory: Denies shortness of breath. Gastrointestinal: No abdominal pain.  No nausea, no vomiting.  No diarrhea.  No constipation. Genitourinary: Negative for dysuria. Musculoskeletal: muscle cramps Skin: Negative for rash. Neurological: Negative for headaches, focal weakness or  numbness.   ____________________________________________   PHYSICAL EXAM:  VITAL SIGNS: ED Triage Vitals  Enc Vitals Group     BP 02/12/17 2026 (!) 149/83     Pulse Rate 02/12/17 2026 77     Resp 02/12/17 2026 16     Temp 02/12/17 2026 98.7 F (37.1 C)     Temp Source 02/12/17 2026 Oral     SpO2 02/12/17 2026 98 %     Weight 02/12/17 2027 201 lb (91.2 kg)     Height 02/12/17 2027 4\' 11"  (1.499 m)     Head Circumference --      Peak Flow --      Pain Score --      Pain Loc --      Pain Edu? --      Excl. in [place]? --     Constitutional: Alert and oriented. Well appearing and in no acute distress. Eyes: Conjunctivae are normal. PERRL. EOMI. Head: Atraumatic. Nose: No congestion/rhinnorhea. Mouth/Throat: Mucous membranes are moist.  Oropharynx non-erythematous. Cardiovascular: Normal rate, regular rhythm. Grossly normal heart sounds.  Good peripheral circulation. Respiratory: Normal respiratory effort.  No retractions. Lungs CTAB. Gastrointestinal: Soft and nontender. No distention. Positive bowel sounds Musculoskeletal: No lower extremity tenderness nor edema.   Neurologic:  Normal speech and language.  Skin:  Skin is warm, dry and intact. Psychiatric: Mood and affect are normal.   ____________________________________________   LABS (all labs ordered are listed, but only abnormal results are displayed)  Labs Reviewed  CBC - Abnormal; Notable for the following components:      Result Value   Hemoglobin 11.5 (*)    MCV 78.7 (*)    MCH 25.3 (*)    RDW 15.8 (*)    All other components within normal limits  BASIC METABOLIC PANEL - Abnormal; Notable for the following components:   Potassium 3.3 (*)    Glucose, Bld 125 (*)    BUN 28 (*)    Creatinine, Ser 1.54 (*)    Calcium 8.2 (*)    GFR calc non Af Amer 34 (*)    GFR calc Af Amer 39 (*)    All other components within normal limits  URINALYSIS, COMPLETE (UACMP) WITH MICROSCOPIC - Abnormal; Notable for the  following components:   Color, Urine YELLOW (*)    APPearance HAZY (*)    Hgb urine dipstick MODERATE (*)    Leukocytes, UA LARGE (*)    Bacteria, UA RARE (*)    Squamous Epithelial / LPF 0-5 (*)    Non Squamous Epithelial 0-5 (*)    All other components within normal limits  URINE CULTURE  CK  MAGNESIUM   ____________________________________________  EKG  none ____________________________________________  RADIOLOGY  No results found.  ____________________________________________   PROCEDURES  Procedure(s) performed: None  Procedures  Critical Care performed:  No  ____________________________________________   INITIAL IMPRESSION / ASSESSMENT AND PLAN / ED COURSE  As part of my medical decision making, I reviewed the following data within the electronic MEDICAL RECORD NUMBER Notes from prior ED visits and [place] Controlled Substance Database   This is a 66 year old female who comes into the hospital today with some muscle cramps  The differential diagnosis includes electrolyte abnormality, muscle pain, rhabdomyolysis.  The patient had some blood work checked and it was unremarkable.  Her CK was normal and her magnesium was normal.  When I went in to evaluate the patient her pain was all gone.  I feel that this is the patient's typical muscle pain.  She could try taking some Tylenol if she has this pain in the future and she reports that she drinks a lot of water.  The patient's creatinine is 1.54 which is for the patient.  Since her pain is improved I will discharge her home and have her follow-up with her primary care physician.  Again her blood work is unremarkable.  The patient did have some leukocytes and some hemoglobin in her urine.  I will send a urine culture and again have the patient follow-up with her nephrologist.      ____________________________________________   FINAL CLINICAL IMPRESSION(S) / ED DIAGNOSES  Final diagnoses:  Muscle cramps   Musculoskeletal pain     ED Discharge Orders    None       Note:  This document was prepared using Dragon voice recognition software and may include unintentional dictation errors.    Loney Hering, MD 02/12/17 [phone]

## 2017-02-12 NOTE — ED Triage Notes (Signed)
Pt states that she has been having muscle cramping through out the day, pt states that she has done this in the  past

## 2017-02-13 NOTE — ED Notes (Signed)

## 2017-02-14 LAB — URINE CULTURE

## 2017-06-01 ENCOUNTER — Emergency Department: Payer: Medicare Other

## 2017-06-01 ENCOUNTER — Inpatient Hospital Stay: Payer: Medicare Other

## 2017-06-01 ENCOUNTER — Inpatient Hospital Stay
Admission: EM | Admit: 2017-06-01 | Discharge: 2017-06-03 | DRG: 641 | Disposition: A | Discharge status: Home Health | Payer: Medicare Other | Attending: Specialist | Admitting: Specialist

## 2017-06-01 ENCOUNTER — Other Ambulatory Visit: Payer: Self-pay

## 2017-06-01 ENCOUNTER — Encounter: Payer: Self-pay | Admitting: Emergency Medicine

## 2017-06-01 DIAGNOSIS — Y92009 Unspecified place in unspecified non-institutional (private) residence as the place of occurrence of the external cause: Secondary | ICD-10-CM | POA: Diagnosis not present

## 2017-06-01 DIAGNOSIS — F0390 Unspecified dementia without behavioral disturbance: Secondary | ICD-10-CM | POA: Diagnosis present

## 2017-06-01 DIAGNOSIS — F329 Major depressive disorder, single episode, unspecified: Secondary | ICD-10-CM | POA: Diagnosis present

## 2017-06-01 DIAGNOSIS — Z940 Kidney transplant status: Secondary | ICD-10-CM

## 2017-06-01 DIAGNOSIS — N183 Chronic kidney disease, stage 3 (moderate): Secondary | ICD-10-CM | POA: Diagnosis present

## 2017-06-01 DIAGNOSIS — I129 Hypertensive chronic kidney disease with stage 1 through stage 4 chronic kidney disease, or unspecified chronic kidney disease: Secondary | ICD-10-CM | POA: Diagnosis present

## 2017-06-01 DIAGNOSIS — Z6841 Body Mass Index (BMI) 40.0 and over, adult: Secondary | ICD-10-CM

## 2017-06-01 DIAGNOSIS — Z888 Allergy status to other drugs, medicaments and biological substances status: Secondary | ICD-10-CM

## 2017-06-01 DIAGNOSIS — M25462 Effusion, left knee: Secondary | ICD-10-CM | POA: Diagnosis present

## 2017-06-01 DIAGNOSIS — D631 Anemia in chronic kidney disease: Secondary | ICD-10-CM | POA: Diagnosis present

## 2017-06-01 DIAGNOSIS — E876 Hypokalemia: Secondary | ICD-10-CM | POA: Diagnosis not present

## 2017-06-01 DIAGNOSIS — M1710 Unilateral primary osteoarthritis, unspecified knee: Secondary | ICD-10-CM | POA: Diagnosis present

## 2017-06-01 DIAGNOSIS — R197 Diarrhea, unspecified: Secondary | ICD-10-CM | POA: Diagnosis present

## 2017-06-01 DIAGNOSIS — W19XXXA Unspecified fall, initial encounter: Secondary | ICD-10-CM | POA: Diagnosis present

## 2017-06-01 DIAGNOSIS — E669 Obesity, unspecified: Secondary | ICD-10-CM | POA: Diagnosis present

## 2017-06-01 DIAGNOSIS — Z79899 Other long term (current) drug therapy: Secondary | ICD-10-CM | POA: Diagnosis not present

## 2017-06-01 DIAGNOSIS — Z87891 Personal history of nicotine dependence: Secondary | ICD-10-CM | POA: Diagnosis not present

## 2017-06-01 DIAGNOSIS — R262 Difficulty in walking, not elsewhere classified: Secondary | ICD-10-CM

## 2017-06-01 DIAGNOSIS — M1712 Unilateral primary osteoarthritis, left knee: Secondary | ICD-10-CM | POA: Diagnosis present

## 2017-06-01 DIAGNOSIS — Z95828 Presence of other vascular implants and grafts: Secondary | ICD-10-CM | POA: Diagnosis not present

## 2017-06-01 DIAGNOSIS — M25562 Pain in left knee: Secondary | ICD-10-CM | POA: Diagnosis present

## 2017-06-01 LAB — GASTROINTESTINAL PANEL BY PCR, STOOL (REPLACES STOOL CULTURE)
ADENOVIRUS F40/41: NOT DETECTED
ASTROVIRUS: NOT DETECTED
CYCLOSPORA CAYETANENSIS: NOT DETECTED
Campylobacter species: NOT DETECTED
Cryptosporidium: NOT DETECTED
ENTEROPATHOGENIC E COLI (EPEC): NOT DETECTED
ENTEROTOXIGENIC E COLI (ETEC): NOT DETECTED
Entamoeba histolytica: NOT DETECTED
Enteroaggregative E coli (EAEC): NOT DETECTED
Giardia lamblia: NOT DETECTED
NOROVIRUS GI/GII: NOT DETECTED
Plesimonas shigelloides: NOT DETECTED
Rotavirus A: NOT DETECTED
Salmonella species: NOT DETECTED
Sapovirus (I, II, IV, and V): NOT DETECTED
Shiga like toxin producing E coli (STEC): NOT DETECTED
Shigella/Enteroinvasive E coli (EIEC): NOT DETECTED
VIBRIO SPECIES: NOT DETECTED
Vibrio cholerae: NOT DETECTED
Yersinia enterocolitica: NOT DETECTED

## 2017-06-01 LAB — COMPREHENSIVE METABOLIC PANEL
ALBUMIN: 3.3 g/dL — AB (ref 3.5–5.0)
ALT: 14 U/L (ref 14–54)
ANION GAP: 7 (ref 5–15)
AST: 21 U/L (ref 15–41)
Alkaline Phosphatase: 60 U/L (ref 38–126)
BUN: 18 mg/dL (ref 6–20)
CHLORIDE: 104 mmol/L (ref 101–111)
CO2: 28 mmol/L (ref 22–32)
Calcium: 7.9 mg/dL — ABNORMAL LOW (ref 8.9–10.3)
Creatinine, Ser: 1.42 mg/dL — ABNORMAL HIGH (ref 0.44–1.00)
GFR calc Af Amer: 44 mL/min — ABNORMAL LOW (ref >60)
GFR calc non Af Amer: 38 mL/min — ABNORMAL LOW (ref >60)
GLUCOSE: 112 mg/dL — AB (ref 65–99)
Potassium: 2.8 mmol/L — ABNORMAL LOW (ref 3.5–5.1)
SODIUM: 139 mmol/L (ref 135–145)
Total Bilirubin: 0.8 mg/dL (ref 0.3–1.2)
Total Protein: 7.1 g/dL (ref 6.5–8.1)

## 2017-06-01 LAB — URINALYSIS, COMPLETE (UACMP) WITH MICROSCOPIC
BILIRUBIN URINE: NEGATIVE
Bacteria, UA: NONE SEEN
Glucose, UA: NEGATIVE mg/dL
HGB URINE DIPSTICK: NEGATIVE
KETONES UR: 5 mg/dL — AB
LEUKOCYTES UA: NEGATIVE
NITRITE: NEGATIVE
PROTEIN: NEGATIVE mg/dL
SPECIFIC GRAVITY, URINE: 1.013 (ref 1.005–1.030)
pH: 6 (ref 5.0–8.0)

## 2017-06-01 LAB — TROPONIN I: Troponin I: <0.03 ng/mL (ref <0.03)

## 2017-06-01 LAB — CBC
HCT: 29.1 % — ABNORMAL LOW (ref 35.0–47.0)
Hemoglobin: 10.1 g/dL — ABNORMAL LOW (ref 12.0–16.0)
MCH: 26.7 pg (ref 26.0–34.0)
MCHC: 34.6 g/dL (ref 32.0–36.0)
MCV: 77.3 fL — ABNORMAL LOW (ref 80.0–100.0)
PLATELETS: 173 10*3/uL (ref 150–440)
RBC: 3.76 MIL/uL — AB (ref 3.80–5.20)
RDW: 15.4 % — ABNORMAL HIGH (ref 11.5–14.5)
WBC: 4.6 10*3/uL (ref 3.6–11.0)

## 2017-06-01 MED ORDER — CITALOPRAM HYDROBROMIDE 20 MG PO TABS
20.0000 mg | ORAL_TABLET | Freq: Every day | ORAL | Status: DC
Start: 2017-06-02 — End: 2017-06-03
  Administered 2017-06-02 – 2017-06-03 (×2): 20 mg via ORAL
  Filled 2017-06-01 (×2): qty 1

## 2017-06-01 MED ORDER — ACETAMINOPHEN 325 MG PO TABS: 650.0000 mg | ORAL_TABLET | Freq: Four times a day (QID) | ORAL | Status: DC | PRN

## 2017-06-01 MED ORDER — SODIUM CHLORIDE 0.9 % IV SOLN
1.0000 g | Freq: Once | INTRAVENOUS | Status: AC
  Administered 2017-06-01: 1 g via INTRAVENOUS
  Filled 2017-06-01: qty 10

## 2017-06-01 MED ORDER — POTASSIUM CHLORIDE 20 MEQ/15ML (10%) PO SOLN
40.0000 meq | Freq: Once | ORAL | Status: AC
  Administered 2017-06-01: 40 meq via ORAL
  Filled 2017-06-01: qty 30

## 2017-06-01 MED ORDER — CALCIUM CARBONATE ANTACID 500 MG PO CHEW
2.0000 | CHEWABLE_TABLET | Freq: Every day | ORAL | Status: DC | PRN
  Filled 2017-06-01: qty 2

## 2017-06-01 MED ORDER — MORPHINE SULFATE (PF) 2 MG/ML IV SOLN: 2.0000 mg | Freq: Once | INTRAVENOUS | Status: DC

## 2017-06-01 MED ORDER — ATORVASTATIN CALCIUM 20 MG PO TABS
40.0000 mg | ORAL_TABLET | Freq: Every day | ORAL | Status: DC
  Administered 2017-06-01 – 2017-06-02 (×2): 40 mg via ORAL
  Filled 2017-06-01 (×2): qty 2

## 2017-06-01 MED ORDER — ONDANSETRON HCL 4 MG/2ML IJ SOLN: 4.0000 mg | Freq: Four times a day (QID) | INTRAMUSCULAR | Status: DC | PRN

## 2017-06-01 MED ORDER — POTASSIUM CHLORIDE CRYS ER 20 MEQ PO TBCR
20.0000 meq | EXTENDED_RELEASE_TABLET | Freq: Two times a day (BID) | ORAL | Status: DC
  Administered 2017-06-01 – 2017-06-02 (×2): 20 meq via ORAL
  Filled 2017-06-01: qty 1

## 2017-06-01 MED ORDER — ONDANSETRON HCL 4 MG PO TABS: 4.0000 mg | ORAL_TABLET | Freq: Four times a day (QID) | ORAL | Status: DC | PRN

## 2017-06-01 MED ORDER — ACETAMINOPHEN 325 MG PO TABS
650.0000 mg | ORAL_TABLET | Freq: Once | ORAL | Status: AC
  Administered 2017-06-01: 650 mg via ORAL

## 2017-06-01 MED ORDER — ACETAMINOPHEN 650 MG RE SUPP: 650.0000 mg | Freq: Four times a day (QID) | RECTAL | Status: DC | PRN

## 2017-06-01 MED ORDER — OXYCODONE HCL 5 MG PO TABS
5.0000 mg | ORAL_TABLET | ORAL | Status: DC | PRN
  Administered 2017-06-01 – 2017-06-02 (×5): 5 mg via ORAL
  Filled 2017-06-01 (×5): qty 1

## 2017-06-01 MED ORDER — FUROSEMIDE 40 MG PO TABS: 20.0000 mg | ORAL_TABLET | Freq: Two times a day (BID) | ORAL | Status: DC

## 2017-06-01 MED ORDER — ENOXAPARIN SODIUM 40 MG/0.4ML ~~LOC~~ SOLN
40.0000 mg | SUBCUTANEOUS | Status: DC
  Administered 2017-06-01: 40 mg via SUBCUTANEOUS
  Filled 2017-06-01: qty 0.4

## 2017-06-01 MED ORDER — MYCOPHENOLATE SODIUM 180 MG PO TBEC
180.0000 mg | DELAYED_RELEASE_TABLET | Freq: Two times a day (BID) | ORAL | Status: DC
  Administered 2017-06-01 – 2017-06-03 (×4): 180 mg via ORAL
  Filled 2017-06-01 (×5): qty 1

## 2017-06-01 MED ORDER — FOLIC ACID 1 MG PO TABS
1.0000 mg | ORAL_TABLET | Freq: Two times a day (BID) | ORAL | Status: DC
  Administered 2017-06-02 – 2017-06-03 (×3): 1 mg via ORAL
  Filled 2017-06-01 (×4): qty 1

## 2017-06-01 MED ORDER — MAGNESIUM SULFATE 2 GM/50ML IV SOLN
2.0000 g | Freq: Once | INTRAVENOUS | Status: AC
  Administered 2017-06-01: 2 g via INTRAVENOUS
  Filled 2017-06-01: qty 50

## 2017-06-01 MED ORDER — SIROLIMUS 1 MG PO TABS
3.0000 mg | ORAL_TABLET | Freq: Every day | ORAL | Status: DC
  Administered 2017-06-02: 3 mg via ORAL
  Filled 2017-06-01 (×2): qty 3

## 2017-06-01 MED ORDER — ACETAMINOPHEN 325 MG PO TABS
ORAL_TABLET | ORAL | Status: AC
Start: 2017-06-01 — End: 2017-06-01
  Administered 2017-06-01: 650 mg via ORAL
  Filled 2017-06-01: qty 2

## 2017-06-01 MED ORDER — LOSARTAN POTASSIUM 50 MG PO TABS
25.0000 mg | ORAL_TABLET | Freq: Every day | ORAL | Status: DC
  Administered 2017-06-02 – 2017-06-03 (×2): 25 mg via ORAL
  Filled 2017-06-01 (×2): qty 1

## 2017-06-01 MED ORDER — METOPROLOL SUCCINATE ER 50 MG PO TB24
25.0000 mg | ORAL_TABLET | Freq: Every day | ORAL | Status: DC
  Administered 2017-06-02 – 2017-06-03 (×2): 25 mg via ORAL
  Filled 2017-06-01 (×2): qty 1

## 2017-06-01 MED ORDER — CALCITRIOL 0.25 MCG PO CAPS
0.2500 ug | ORAL_CAPSULE | Freq: Every day | ORAL | Status: DC
  Administered 2017-06-02 – 2017-06-03 (×2): 0.25 ug via ORAL
  Filled 2017-06-01 (×2): qty 1

## 2017-06-01 MED ORDER — DONEPEZIL HCL 5 MG PO TABS
5.0000 mg | ORAL_TABLET | Freq: Every evening | ORAL | Status: DC
  Administered 2017-06-01 – 2017-06-02 (×2): 5 mg via ORAL
  Filled 2017-06-01 (×3): qty 1

## 2017-06-01 MED ORDER — CALCIUM CARBONATE ANTACID 1000 MG PO CHEW: 1000.0000 mg | CHEWABLE_TABLET | Freq: Every day | ORAL | Status: DC | PRN

## 2017-06-01 NOTE — ED Triage Notes (Signed)
Pt to ED via EMS from home with c/o increased unwitnessed falls the past 24hrs. PT hx of dementia, lives with daughter. MD at bedside, VSS. Pt LFT knee has severe swelling noted.

## 2017-06-01 NOTE — ED Notes (Signed)
Family at bedside. 

## 2017-06-01 NOTE — ED Notes (Signed)
Pt unable to ambulate or sit on side of bed due to LFT knee pain. MD made aware

## 2017-06-01 NOTE — ED Notes (Signed)
The EKG was completed and signed by Dr. Owens Shark. The EKG was also exported into the system.

## 2017-06-01 NOTE — ED Notes (Signed)
Fall risk alarm placed on pt at this time

## 2017-06-01 NOTE — ED Provider Notes (Signed)
[facility] Emergency Department Provider Note    First MD Initiated Contact with Patient 06/01/17 1327     (approximate)  I have reviewed the triage vital signs and the nursing notes.   HISTORY  Chief Complaint Fall    HPI [name] is a 67 y.o. female with bolus of chronic medical conditions including dementia and kidney transplant resents to the emergency department after fall at home. EMS states that the patient fell on bilateral knees and has recurrent left knee pain. EMS states that the patient was attempting to take the trash out to the dumpster when she had the fall. Patient denies any head injury no loss of consciousness. Patient states current pain score is 10 out of 10   Past Medical History:  Diagnosis Date  . Anemia   . Cancer ([place])   . Dementia   . GERD (gastroesophageal reflux disease)   . Hypertension   . Renal disorder     Patient Active Problem List   Diagnosis Date Noted  . Hypokalemia 06/01/2017  . Sepsis ([place]) 06/29/2016  . UTI (urinary tract infection) 06/29/2016  . GERD (gastroesophageal reflux disease) 06/29/2016  . Dementia 06/29/2016  . 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 Essential hypertension 07/06/2014  . H/O kidney transplant 07/06/2014    Past Surgical History:  Procedure Laterality Date  . ABDOMINAL HYSTERECTOMY    . AV FISTULA PLACEMENT    . BREAST BIOPSY     Patient is unsure but she thinks she remember a Bx  . KIDNEY TRANSPLANT    . NEPHRECTOMY TRANSPLANTED ORGAN      Prior to Admission medications   Medication Sig Start Date End Date Taking? Authorizing Provider  atorvastatin (LIPITOR) 40 MG tablet Take 40 mg by mouth at bedtime.   Yes [provider]  calcitRIOL (ROCALTROL) 0.25 MCG capsule Take 0.25 mcg by mouth daily.   Yes [provider]  calcium elemental as carbonate (TUMS ULTRA 1000) 400 MG chewable tablet Chew 1,000 mg by mouth daily as needed for heartburn.    Yes [provider]  citalopram (CELEXA) 20 MG tablet Take 20 mg by mouth daily.   Yes [provider]  donepezil (ARICEPT) 5 MG tablet Take 5 mg by mouth every evening.  04/05/16  Yes [provider]  folic acid (FOLVITE) 1 MG tablet Take 1 mg by mouth 2 (two) times daily.    Yes [provider]  furosemide (LASIX) 20 MG tablet Take 20 mg by mouth 2 (two) times daily.   Yes [provider]  losartan (COZAAR) 25 MG tablet Take 25 mg by mouth daily.   Yes [provider]  metoprolol succinate (TOPROL-XL) 25 MG 24 hr tablet Take 1 tablet (25 mg total) by mouth daily. 07/02/16  Yes Gladstone Lighter, MD  mycophenolate (MYFORTIC) 180 MG EC tablet Take 180 mg by mouth 2 (two) times daily.   Yes [provider]  potassium chloride (K-DUR,KLOR-CON) 10 MEQ tablet Take 20 mEq by mouth 2 (two) times daily.   Yes [provider]  sirolimus (RAPAMUNE) 1 MG tablet Take 3 mg by mouth daily.    Yes [provider]    Allergies Ace inhibitors and Lisinopril  Family History  Problem Relation Age of Onset  . Stroke Mother   . Alcohol abuse Father   . Breast cancer Neg Hx     Social History Social History   Tobacco Use  . Smoking status: Former Research scientist (life sciences)  .  Smokeless tobacco: Never Used  Substance Use Topics  . Alcohol use: No  . Drug use: No    Review of Systems Constitutional: No fever/chills Eyes: No visual changes. ENT: No sore throat. Cardiovascular: Denies chest pain. Respiratory: Denies shortness of breath. Gastrointestinal: No abdominal pain.  No nausea, no vomiting.  No diarrhea.  No constipation. Genitourinary: Negative for dysuria. Musculoskeletal: Negative for neck pain.  Negative for back pain.positive for left knee pain Integumentary: Negative for rash. Neurological: Negative for headaches, focal weakness or numbness.   ____________________________________________   PHYSICAL EXAM:  VITAL  SIGNS: ED Triage Vitals  Enc Vitals Group     BP 06/01/17 0843 (!) 159/74     Pulse Rate 06/01/17 0843 64     Resp 06/01/17 0843 16     Temp 06/01/17 0843 98.3 F (36.8 C)     Temp Source 06/01/17 0843 Oral     SpO2 06/01/17 0843 98 %     Weight --      Height --      Head Circumference --      Peak Flow --      Pain Score 06/01/17 0842 0     Pain Loc --      Pain Edu? --      Excl. in [place]? --    Constitutional: Alert with apparent discomfort Eyes: Conjunctivae are normal. Head: Atraumatic. Mouth/Throat: Mucous membranes are moist.  Oropharynx non-erythematous. Neck: No stridor.  No cervical spine tenderness to palpation. Cardiovascular: Normal rate, regular rhythm. Good peripheral circulation. Grossly normal heart sounds. Respiratory: Normal respiratory effort.  No retractions. Lungs CTAB. Gastrointestinal: Soft and nontender. No distention.  Musculoskeletal: left knee pain with suprapatellar swelling Neurologic:  Normal speech and language. No gross focal neurologic deficits are appreciated.  Skin:  Skin is warm, dry and intact. No rash noted. Psychiatric: Mood and affect are normal. Speech and behavior are normal.  ____________________________________________   LABS (all labs ordered are listed, but only abnormal results are displayed)  Labs Reviewed  CBC - Abnormal; Notable for the following components:      Result Value   RBC 3.76 (*)    Hemoglobin 10.1 (*)    HCT 29.1 (*)    MCV 77.3 (*)    RDW 15.4 (*)    All other components within normal limits  COMPREHENSIVE METABOLIC PANEL - Abnormal; Notable for the following components:   Potassium 2.8 (*)    Glucose, Bld 112 (*)    Creatinine, Ser 1.42 (*)    Calcium 7.9 (*)    Albumin 3.3 (*)    GFR calc non Af Amer 38 (*)    GFR calc Af Amer 44 (*)    All other components within normal limits  URINALYSIS, COMPLETE (UACMP) WITH MICROSCOPIC - Abnormal; Notable for the following components:   Color, Urine YELLOW (*)     APPearance CLEAR (*)    Ketones, ur 5 (*)    Squamous Epithelial / LPF 0-5 (*)    All other components within normal limits  GASTROINTESTINAL PANEL BY PCR, STOOL (REPLACES STOOL CULTURE)  C DIFFICILE QUICK SCREEN W PCR REFLEX  TROPONIN I   ____________________________________________  EKG  ED ECG REPORT I, Weed N BROWN, the attending physician, personally viewed and interpreted this ECG.   Date: 06/02/2017  EKG Time: 8:46 AM  Rate: 66  Rhythm: normal sinus rhythm with}  Axis: normal  Intervals:normal  ST&T Change: none  ____________________________________________  RADIOLOGY I, [place] Ernst Bowler, personally  viewed and evaluated these images (plain radiographs) as part of my medical decision making, as well as reviewing the written report by the radiologist.  ED MD interpretation:  CT head revealed no acute intracranial findings. Knee x-ray possible joint effusion per radiologist.  Official radiology report(s): Ct Head Wo Contrast  Result Date: 06/01/2017 CLINICAL DATA:  Increased falling over the last day. History of dementia. EXAM: CT HEAD WITHOUT CONTRAST TECHNIQUE: Contiguous axial images were obtained from the base of the skull through the vertex without intravenous contrast. COMPARISON:  02/24/2014 FINDINGS: Brain: Generalized brain atrophy as seen previously. No sign of acute infarction, mass lesion, hemorrhage, hydrocephalus or extra-axial collection. Mild chronic small-vessel change of the hemispheric white matter. Vascular: There is atherosclerotic calcification of the major vessels at the base of the brain. Skull: Negative Sinuses/Orbits: Clear/normal Other: None IMPRESSION: No acute or traumatic finding. Atrophy as seen previously. Mild small vessel change of the white matter. Electronically Signed   By: Nelson Chimes M.D.   On: 06/01/2017 09:29   Dg Knee Complete 4 Views Left  Result Date: 06/01/2017 CLINICAL DATA:  Fall.  Left knee pain. EXAM: LEFT KNEE -  COMPLETE 4+ VIEW COMPARISON:  No recent. FINDINGS: Knee joint effusion may be present. Diffuse osteopenia and tricompartment degenerative change. No acute bony abnormality. Small loose bodies may be present. No acute bony abnormality. No evidence of fracture or dislocation. IMPRESSION: 1. Diffuse osteopenia and tricompartment degenerative change. Small loose bodies may be present. Knee joint effusion may be present. 2.  No acute bony abnormality. Electronically Signed   By: Marcello Moores  Register   On: 06/01/2017 09:39     Procedures   ____________________________________________   INITIAL IMPRESSION / ASSESSMENT AND PLAN / ED COURSE  As part of my medical decision making, I reviewed the following data within the electronic MEDICAL RECORD NUMBER   67 year old female presenting with above stated history of physical exam secondary to fall with resultant left knee pain. Patient given IV morphine emergency department with continued 10 out of 10 pain. Patient unable to ambulate secondary to discomfort.laboratory data revealed hypokalemia with a potassium of 2.8 hypocalcemia with a calcium of 7.9. Patient received 40 mEq of by mouth potassium chloride as well as 1 g of IV calcium gluconate. Given patient's inability to ambulate and continued discomfort patient discussed with Dr. Leslye Peer hospitalist for hospital admission for further evaluation and management. ____________________________________________  FINAL CLINICAL IMPRESSION(S) / ED DIAGNOSES  Intractable Pain Hypokalemic Left knee pain   MEDICATIONS GIVEN DURING THIS VISIT:  Medications  morphine 2 MG/ML injection 2 mg (0 mg Intravenous Hold 06/01/17 1330)  magnesium sulfate IVPB 2 g 50 mL (has no administration in time range)  potassium chloride SA (K-DUR,KLOR-CON) CR tablet 20 mEq (has no administration in time range)  atorvastatin (LIPITOR) tablet 40 mg (has no administration in time range)  calcitRIOL (ROCALTROL) capsule 0.25 mcg (has no  administration in time range)  citalopram (CELEXA) tablet 20 mg (has no administration in time range)  donepezil (ARICEPT) tablet 5 mg (has no administration in time range)  folic acid (FOLVITE) tablet 1 mg (has no administration in time range)  losartan (COZAAR) tablet 25 mg (has no administration in time range)  metoprolol succinate (TOPROL-XL) 24 hr tablet 25 mg (has no administration in time range)  mycophenolate (MYFORTIC) EC tablet 180 mg (has no administration in time range)  sirolimus (RAPAMUNE) tablet 3 mg (has no administration in time range)  calcium carbonate (BARIATRIC TUMS ULTRA) 1000 mg with  elemental calcium 400 mg/chewable tablet (has no administration in time range)  enoxaparin (LOVENOX) injection 30 mg (has no administration in time range)  acetaminophen (TYLENOL) tablet 650 mg (has no administration in time range)    Or  acetaminophen (TYLENOL) suppository 650 mg (has no administration in time range)  oxyCODONE (Oxy IR/ROXICODONE) immediate release tablet 5 mg (has no administration in time range)  ondansetron (ZOFRAN) tablet 4 mg (has no administration in time range)    Or  ondansetron (ZOFRAN) injection 4 mg (has no administration in time range)  acetaminophen (TYLENOL) tablet 650 mg (650 mg Oral Given 06/01/17 1107)  potassium chloride 20 MEQ/15ML (10%) solution 40 mEq (40 mEq Oral Given 06/01/17 1241)  calcium gluconate 1 g in sodium chloride 0.9 % 100 mL IVPB (0 g Intravenous Stopped 06/01/17 1334)     ED Discharge Orders    None       Note:  This document was prepared using Dragon voice recognition software and may include unintentional dictation errors.    Gregor Hams, MD 06/02/17 [phone]

## 2017-06-01 NOTE — H&P (Signed)
[place] [facility] [place] NAME: [name]    MR#:  [id number]  DATE OF BIRTH:  08/21/1950  DATE OF ADMISSION:  06/01/2017  PRIMARY CARE PHYSICIAN: [facility] [place]   REQUESTING/REFERRING PHYSICIAN: Dr Marjean Donna  CHIEF COMPLAINT:   Chief Complaint  Patient presents with  . Fall    HISTORY OF PRESENT ILLNESS:  [name]  is a 67 y.o. female with a known history of dementia and kidney transplant presented after a fall.  The patient states that she has had diarrhea and some pain in the stomach.  Currently not having any pain in the stomach.  The patient cannot tell me anything what happened.  The ER physician stated that the patient had a fall and is unable to walk and has left knee pain.  I was able to speak with the daughter on the phone.  She states that the patient had a fall when she was taking trash out to the dumpster.  She was able to get up at that time.  She was also having some diarrhea.  PAST MEDICAL HISTORY:   Past Medical History:  Diagnosis Date  . Anemia   . Cancer ([place])   . Dementia   . GERD (gastroesophageal reflux disease)   . Hypertension   . Renal disorder     PAST SURGICAL HISTORY:   Past Surgical History:  Procedure Laterality Date  . ABDOMINAL HYSTERECTOMY    . AV FISTULA PLACEMENT    . BREAST BIOPSY     Patient is unsure but she thinks she remember a Bx  . KIDNEY TRANSPLANT    . NEPHRECTOMY TRANSPLANTED ORGAN      SOCIAL HISTORY:   Social History   Tobacco Use  . Smoking status: Former Research scientist (life sciences)  . Smokeless tobacco: Never Used  Substance Use Topics  . Alcohol use: No    FAMILY HISTORY:   Family History  Problem Relation Age of Onset  . Stroke Mother   . Alcohol abuse Father   . Breast cancer Neg Hx     DRUG ALLERGIES:   Allergies  Allergen Reactions  . Ace Inhibitors Cough  . Lisinopril Cough    REVIEW OF SYSTEMS:  CONSTITUTIONAL: No fever, fatigue  or weakness.  EYES: No blurred or double vision.  EARS, NOSE, AND THROAT: No tinnitus or ear pain. No sore throat RESPIRATORY: No cough, shortness of breath, wheezing or hemoptysis.  CARDIOVASCULAR: No chest pain, orthopnea, edema.  GASTROINTESTINAL: No nausea, vomiting.  Positive for diarrhea  and abdominal pain. No blood in bowel movements GENITOURINARY: No dysuria, hematuria.  ENDOCRINE: No polyuria, nocturia,  HEMATOLOGY: No anemia, easy bruising or bleeding SKIN: No rash or lesion. MUSCULOSKELETAL:  left knee pain NEUROLOGIC: No tingling, numbness, weakness.  PSYCHIATRY: No anxiety or depression.   MEDICATIONS AT HOME:   Prior to Admission medications   Medication Sig Start Date End Date Taking? Authorizing Provider  atorvastatin (LIPITOR) 40 MG tablet Take 40 mg by mouth at bedtime.   Yes [provider]  calcitRIOL (ROCALTROL) 0.25 MCG capsule Take 0.25 mcg by mouth daily.   Yes [provider]  calcium elemental as carbonate (TUMS ULTRA 1000) 400 MG chewable tablet Chew 1,000 mg by mouth daily as needed for heartburn.   Yes [provider]  citalopram (CELEXA) 20 MG tablet Take 20 mg by mouth daily.   Yes [provider]  donepezil (ARICEPT) 5 MG tablet Take 5 mg by mouth  every evening.  04/05/16  Yes [provider]  folic acid (FOLVITE) 1 MG tablet Take 1 mg by mouth 2 (two) times daily.    Yes [provider]  furosemide (LASIX) 20 MG tablet Take 20 mg by mouth 2 (two) times daily.   Yes [provider]  losartan (COZAAR) 25 MG tablet Take 25 mg by mouth daily.   Yes [provider]  metoprolol succinate (TOPROL-XL) 25 MG 24 hr tablet Take 1 tablet (25 mg total) by mouth daily. 07/02/16  Yes Gladstone Lighter, MD  mycophenolate (MYFORTIC) 180 MG EC tablet Take 180 mg by mouth 2 (two) times daily.   Yes [provider]  potassium chloride (K-DUR,KLOR-CON) 10 MEQ tablet Take 20 mEq by mouth 2 (two)  times daily.   Yes [provider]  sirolimus (RAPAMUNE) 1 MG tablet Take 3 mg by mouth daily.    Yes [provider]      VITAL SIGNS:  Blood pressure 136/75, pulse 74, temperature 98.3 F (36.8 C), temperature source Oral, resp. rate 17, SpO2 98 %.  PHYSICAL EXAMINATION:  GENERAL:  67 y.o.-year-old patient lying in the bed with no acute distress.  EYES: Pupils equal, round, reactive to light and accommodation. No scleral icterus. Extraocular muscles intact.  HEENT: Head atraumatic, normocephalic. Oropharynx and nasopharynx clear.  NECK:  Supple, no jugular venous distention. No thyroid enlargement, no tenderness.  LUNGS: Normal breath sounds bilaterally, no wheezing, rales,rhonchi or crepitation. No use of accessory muscles of respiration.  CARDIOVASCULAR: S1, S2 normal. No murmurs, rubs, or gallops.  ABDOMEN: Soft, nontender, nondistended. Bowel sounds present. No organomegaly or mass.  EXTREMITIES: 3+ pedal edema, no cyanosis, or clubbing.  Left knee difficult to move secondary to pain.  Pain over the patella and sides of the left knee.  NEUROLOGIC: Cranial nerves II through XII are intact. Muscle strength 5/5 in all extremities. Sensation intact. Gait not checked.  PSYCHIATRIC: The patient is alert and  answers questions.  SKIN: No rash, lesion, or ulcer.   LABORATORY PANEL:   CBC Recent Labs  Lab 06/01/17 0846  WBC 4.6  HGB 10.1*  HCT 29.1*  PLT 173   ------------------------------------------------------------------------------------------------------------------  Chemistries  Recent Labs  Lab 06/01/17 0846  NA 139  K 2.8*  CL 104  CO2 28  GLUCOSE 112*  BUN 18  CREATININE 1.42*  CALCIUM 7.9*  AST 21  ALT 14  ALKPHOS 60  BILITOT 0.8   ------------------------------------------------------------------------------------------------------------------  Cardiac Enzymes Recent Labs  Lab 06/01/17 0846  TROPONINI <0.03    ------------------------------------------------------------------------------------------------------------------  RADIOLOGY:  Ct Head Wo Contrast  Result Date: 06/01/2017 CLINICAL DATA:  Increased falling over the last day. History of dementia. EXAM: CT HEAD WITHOUT CONTRAST TECHNIQUE: Contiguous axial images were obtained from the base of the skull through the vertex without intravenous contrast. COMPARISON:  02/24/2014 FINDINGS: Brain: Generalized brain atrophy as seen previously. No sign of acute infarction, mass lesion, hemorrhage, hydrocephalus or extra-axial collection. Mild chronic small-vessel change of the hemispheric white matter. Vascular: There is atherosclerotic calcification of the major vessels at the base of the brain. Skull: Negative Sinuses/Orbits: Clear/normal Other: None IMPRESSION: No acute or traumatic finding. Atrophy as seen previously. Mild small vessel change of the white matter. Electronically Signed   By: Nelson Chimes M.D.   On: 06/01/2017 09:29   Dg Knee Complete 4 Views Left  Result Date: 06/01/2017 CLINICAL DATA:  Fall.  Left knee pain. EXAM: LEFT KNEE - COMPLETE 4+ VIEW COMPARISON:  No  recent. FINDINGS: Knee joint effusion may be present. Diffuse osteopenia and tricompartment degenerative change. No acute bony abnormality. Small loose bodies may be present. No acute bony abnormality. No evidence of fracture or dislocation. IMPRESSION: 1. Diffuse osteopenia and tricompartment degenerative change. Small loose bodies may be present. Knee joint effusion may be present. 2.  No acute bony abnormality. Electronically Signed   By: Marcello Moores  Register   On: 06/01/2017 09:39    EKG:   Sinus rhythm 66 bpm flattening of T waves laterally  IMPRESSION AND PLAN:   1.  Hypokalemia replace potassium orally.  Replace magnesium and check a magnesium tomorrow morning.  2.   hypocalcemia.  Calcium replaced IV in the ER. 3.  Diarrhea.  Send off stool studies 4.  Left knee pain.  ER  physician ordered an MRI of the left knee.  Degenerative changes seen on x-ray.  Will get orthopedic consultation and physical therapy consultation. 5.  Renal transplant patient with chronic kidney disease stage III.  Continue immunosuppressive's.  Hold Lasix today.  Can consider restarting tomorrow. 6.  History of dementia 7.  History of GERD 8.  Essential hypertension continue usual medications  All the records are reviewed and case discussed with ED provider. Management plans discussed with the patient, and daughter on the phone and they are in agreement.  CODE STATUS: Full code  TOTAL TIME TAKING CARE OF THIS PATIENT: 50 minutes.    Loletha Grayer M.D on 06/01/2017 at 1:49 PM  Between 7am to 6pm - Pager - [phone]  After 6pm call admission pager [phone]  [facility]  [phone]  CC: Primary care physician; [facility]

## 2017-06-01 NOTE — ED Notes (Signed)
Patient transported to MRI 

## 2017-06-02 LAB — MAGNESIUM: Magnesium: 2.1 mg/dL (ref 1.7–2.4)

## 2017-06-02 LAB — CBC
HCT: 32 % — ABNORMAL LOW (ref 35.0–47.0)
Hemoglobin: 10.6 g/dL — ABNORMAL LOW (ref 12.0–16.0)
MCH: 26 pg (ref 26.0–34.0)
MCHC: 33.3 g/dL (ref 32.0–36.0)
MCV: 78.2 fL — ABNORMAL LOW (ref 80.0–100.0)
Platelets: 177 K/uL (ref 150–440)
RBC: 4.09 MIL/uL (ref 3.80–5.20)
RDW: 15.6 % — ABNORMAL HIGH (ref 11.5–14.5)
WBC: 4.9 K/uL (ref 3.6–11.0)

## 2017-06-02 LAB — C DIFFICILE QUICK SCREEN W PCR REFLEX
C Diff antigen: NEGATIVE
C Diff interpretation: NOT DETECTED
C Diff toxin: NEGATIVE

## 2017-06-02 LAB — BASIC METABOLIC PANEL WITH GFR
Anion gap: 10 (ref 5–15)
BUN: 12 mg/dL (ref 6–20)
CO2: 24 mmol/L (ref 22–32)
Calcium: 8.2 mg/dL — ABNORMAL LOW (ref 8.9–10.3)
Chloride: 106 mmol/L (ref 101–111)
Creatinine, Ser: 1.18 mg/dL — ABNORMAL HIGH (ref 0.44–1.00)
GFR calc Af Amer: 54 mL/min — ABNORMAL LOW
GFR calc non Af Amer: 47 mL/min — ABNORMAL LOW
Glucose, Bld: 118 mg/dL — ABNORMAL HIGH (ref 65–99)
Potassium: 2.8 mmol/L — ABNORMAL LOW (ref 3.5–5.1)
Sodium: 140 mmol/L (ref 135–145)

## 2017-06-02 MED ORDER — SIROLIMUS 1 MG PO TABS
3.0000 mg | ORAL_TABLET | Freq: Every day | ORAL | Status: DC
  Administered 2017-06-03: 3 mg via ORAL
  Filled 2017-06-02: qty 3

## 2017-06-02 MED ORDER — POTASSIUM CHLORIDE CRYS ER 20 MEQ PO TBCR
20.0000 meq | EXTENDED_RELEASE_TABLET | Freq: Three times a day (TID) | ORAL | Status: AC
  Administered 2017-06-02 (×2): 20 meq via ORAL
  Filled 2017-06-02 (×2): qty 1

## 2017-06-02 MED ORDER — ENOXAPARIN SODIUM 40 MG/0.4ML ~~LOC~~ SOLN
40.0000 mg | Freq: Two times a day (BID) | SUBCUTANEOUS | Status: DC
  Administered 2017-06-02 – 2017-06-03 (×2): 40 mg via SUBCUTANEOUS
  Filled 2017-06-02 (×2): qty 0.4

## 2017-06-02 NOTE — Progress Notes (Signed)
Anticoagulation monitoring(Lovenox):  66yo  F ordered Lovenox 40 mg Q24h  Filed Weights   06/01/17 1700  Weight: 215 lb 6.2 oz (97.7 kg)   BMI 43.48   Lab Results  Component Value Date   CREATININE 1.18 (H) 06/02/2017   CREATININE 1.42 (H) 06/01/2017   CREATININE 1.54 (H) 02/12/2017   Estimated Creatinine Clearance: 48.1 mL/min (A) (by C-G formula based on SCr of 1.18 mg/dL (H)). Hemoglobin & Hematocrit     Component Value Date/Time   HGB 10.6 (L) 06/02/2017 0707   HGB 9.1 (L) 02/23/2014 0948   HCT 32.0 (L) 06/02/2017 0707   HCT 28.5 (L) 02/23/2014 6219     Per Protocol for Patient with estCrcl > 30 ml/min and BMI > 40, will transition to Lovenox 40 mg Q12h.      [name] PharmD Clinical Pharmacist 06/02/2017

## 2017-06-02 NOTE — Progress Notes (Signed)
PT Cancellation Note  Patient Details Name: [name] MRN: [id number] DOB: Dec 28, 1950   Cancelled Treatment:    Reason Eval/Treat Not Completed: Medical issues which prohibited therapy(Chart reviewed: Potassium out of safe range for exertional OOB assessment. Pt also with meniscal lesion per MRI, awaiting ortho consult. Will follow acutely and attempt again at later date/time. )  10:54 AM, 06/02/17 Etta Grandchild, PT, DPT Physical Therapist - [facility]  [phone] ([place])     Buccola,Allan C 06/02/2017, 10:53 AM

## 2017-06-02 NOTE — Consult Note (Signed)
ORTHOPAEDIC CONSULTATION  REQUESTING PHYSICIAN: Henreitta Leber, MD  Chief Complaint: Left knee pain  HPI: [name] is a 67 y.o. female who complains of pain in left knee.   States pain is lateral and posterior.  Denies any recent significant trauma.  States pain has been chronic but worsening recently.  Patient unable to have PT evaluation this AM due to hypokalemia.  Past Medical History:  Diagnosis Date  . Anemia   . Cancer ([place])   . Dementia   . GERD (gastroesophageal reflux disease)   . Hypertension   . Renal disorder    Past Surgical History:  Procedure Laterality Date  . ABDOMINAL HYSTERECTOMY    . AV FISTULA PLACEMENT    . BREAST BIOPSY     Patient is unsure but she thinks she remember a Bx  . KIDNEY TRANSPLANT    . NEPHRECTOMY TRANSPLANTED ORGAN     Social History   Socioeconomic History  . Marital status: Married    Spouse name: Not on file  . Number of children: Not on file  . Years of education: Not on file  . Highest education level: Not on file  Occupational History  . Not on file  Social Needs  . Financial resource strain: Not on file  . Food insecurity:    Worry: Not on file    Inability: Not on file  . Transportation needs:    Medical: Not on file    Non-medical: Not on file  Tobacco Use  . Smoking status: Former Research scientist (life sciences)  . Smokeless tobacco: Never Used  Substance and Sexual Activity  . Alcohol use: No  . Drug use: No  . Sexual activity: Not Currently  Lifestyle  . Physical activity:    Days per week: Not on file    Minutes per session: Not on file  . Stress: Not on file  Relationships  . Social connections:    Talks on phone: Not on file    Gets together: Not on file    Attends religious service: Not on file    Active member of club or organization: Not on file    Attends meetings of clubs or organizations: Not on file    Relationship status: Not on file  Other Topics Concern  . Not on file  Social History Narrative  . Not  on file   Family History  Problem Relation Age of Onset  . Stroke Mother   . Alcohol abuse Father   . Breast cancer Neg Hx    Allergies  Allergen Reactions  . Ace Inhibitors Cough  . Lisinopril Cough   Prior to Admission medications   Medication Sig Start Date End Date Taking? Authorizing Provider  atorvastatin (LIPITOR) 40 MG tablet Take 40 mg by mouth at bedtime.   Yes [provider]  calcitRIOL (ROCALTROL) 0.25 MCG capsule Take 0.25 mcg by mouth daily.   Yes [provider]  calcium elemental as carbonate (TUMS ULTRA 1000) 400 MG chewable tablet Chew 1,000 mg by mouth daily as needed for heartburn.   Yes [provider]  citalopram (CELEXA) 20 MG tablet Take 20 mg by mouth daily.   Yes [provider]  donepezil (ARICEPT) 5 MG tablet Take 5 mg by mouth every evening.  04/05/16  Yes [provider]  folic acid (FOLVITE) 1 MG tablet Take 1 mg by mouth 2 (two) times daily.    Yes [provider]  furosemide (LASIX) 20 MG tablet Take 20 mg by  mouth 2 (two) times daily.   Yes [provider]  losartan (COZAAR) 25 MG tablet Take 25 mg by mouth daily.   Yes [provider]  metoprolol succinate (TOPROL-XL) 25 MG 24 hr tablet Take 1 tablet (25 mg total) by mouth daily. 07/02/16  Yes Gladstone Lighter, MD  mycophenolate (MYFORTIC) 180 MG EC tablet Take 180 mg by mouth 2 (two) times daily.   Yes [provider]  potassium chloride (K-DUR,KLOR-CON) 10 MEQ tablet Take 20 mEq by mouth 2 (two) times daily.   Yes [provider]  sirolimus (RAPAMUNE) 1 MG tablet Take 3 mg by mouth daily.    Yes [provider]   Ct Head Wo Contrast  Result Date: 06/01/2017 CLINICAL DATA:  Increased falling over the last day. History of dementia. EXAM: CT HEAD WITHOUT CONTRAST TECHNIQUE: Contiguous axial images were obtained from the base of the skull through the vertex without intravenous contrast. COMPARISON:   02/24/2014 FINDINGS: Brain: Generalized brain atrophy as seen previously. No sign of acute infarction, mass lesion, hemorrhage, hydrocephalus or extra-axial collection. Mild chronic small-vessel change of the hemispheric white matter. Vascular: There is atherosclerotic calcification of the major vessels at the base of the brain. Skull: Negative Sinuses/Orbits: Clear/normal Other: None IMPRESSION: No acute or traumatic finding. Atrophy as seen previously. Mild small vessel change of the white matter. Electronically Signed   By: Nelson Chimes M.D.   On: 06/01/2017 09:29   Mr Knee Left Wo Contrast  Result Date: 06/01/2017 CLINICAL DATA:  Left knee pain after fall. EXAM: MRI OF THE LEFT KNEE WITHOUT CONTRAST TECHNIQUE: Multiplanar, multisequence MR imaging of the knee was performed. No intravenous contrast was administered. COMPARISON:  Left knee x-rays from same day. FINDINGS: MENISCI Medial meniscus:  Horizontal tear of the posterior horn. Lateral meniscus: Degenerative signal within the body and posterior horn without discrete tear. LIGAMENTS Cruciates:  Intact ACL and PCL. Collaterals: Medial collateral ligament is intact. Lateral collateral ligament complex is intact. CARTILAGE Patellofemoral: High-grade near full-thickness cartilage loss over the patella and trochlea with underlying patellar subchondral marrow edema. Medial: Small area of delamination over the central weight-bearing medial femoral condyle. No focal defect. Lateral: Partial-thickness cartilage loss over the lateral femoral condyle. No full-thickness defect. Joint: Large joint effusion. Prominent edema versus loculated ganglion cyst in Hoffa's fat. No plical thickening. Popliteal Fossa:  Small Baker cyst.  Intact popliteus tendon. Extensor Mechanism: Intact quadriceps tendon and patellar tendon. Intact medial and lateral patellar retinaculum. Intact MPFL. Bones: No acute fracture or dislocation. Subchondral cystic change along the periphery of  the entire medial tibial plateau. Tricompartmental osteophytes. Other: Mild subcutaneous edema about the knee.  No fluid collection. IMPRESSION: 1. Horizontal tear of the medial meniscus posterior horn. 2. 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 3. Large joint effusion.  Small Baker cyst. Electronically Signed   By: Titus Dubin M.D.   On: 06/01/2017 15:02   Dg Knee Complete 4 Views Left  Result Date: 06/01/2017 CLINICAL DATA:  Fall.  Left knee pain. EXAM: LEFT KNEE - COMPLETE 4+ VIEW COMPARISON:  No recent. FINDINGS: Knee joint effusion may be present. Diffuse osteopenia and tricompartment degenerative change. No acute bony abnormality. Small loose bodies may be present. No acute bony abnormality. No evidence of fracture or dislocation. IMPRESSION: 1. Diffuse osteopenia and tricompartment degenerative change. Small loose bodies may be present. Knee joint effusion may be present. 2.  No acute bony abnormality. Electronically Signed   By: Marcello Moores  Register   On:  06/01/2017 09:39    Positive ROS: All other systems have been reviewed and were otherwise negative with the exception of those mentioned in the HPI and as above.  Physical Exam: General: Alert, no acute distress  MUSCULOSKELETAL: Left knee:  Skin intact.  + effusion, no erythema or ecchymosis.  ROM limited due to swelling.  NVI.  Intact motor function.    Assessment: Left knee effusion and pain secondary to underlying osteoarthritis  Plan: I have examined the patient and reviewed the patient's xray and MRI.  Patient has tricompartmental osteoarthritis.  Her effusion is likely exacerbation of her underlying arthritis.   Patient may benefit from course of NSAIDs or a steroid taper.   Compression with an ACE wrap, ice to the left knee and elevation will also help the effusion.   Agree with PT evaluation once hypokalemia is treated.  Patient may be WBAT.   Patient will need a walker and may need a knee  immobilizer to help her ambulate.   She can follow up in my office in 2 weeks for re-evaluation if her knee is still painful and swollen.    Thornton Park, MD    06/02/2017 1:33 PM

## 2017-06-02 NOTE — Progress Notes (Signed)
[facility] [place] NAME: [name]    MR#:  [id number]  DATE OF BIRTH:  1950-04-15  SUBJECTIVE:   She is here after a fall and noted to have left knee pain.  She was also noted to be hypokalemic.  Potassium level stools at 2.8 today.  Awaiting orthopedic evaluation.  Patient's MRI of the left knee did show a medial meniscal partial tear.  REVIEW OF SYSTEMS:    Review of Systems  Constitutional: Negative for chills and fever.  HENT: Negative for congestion and tinnitus.   Eyes: Negative for blurred vision and double vision.  Respiratory: Negative for cough, shortness of breath and wheezing.   Cardiovascular: Negative for chest pain, orthopnea and PND.  Gastrointestinal: Negative for abdominal pain, diarrhea, nausea and vomiting.  Genitourinary: Negative for dysuria and hematuria.  Musculoskeletal: Positive for falls and joint pain (Left Knee pain).  Neurological: Negative for dizziness, sensory change and focal weakness.  All other systems reviewed and are negative.   Nutrition: Heart Healthy Tolerating Diet: Yes Tolerating PT: Await Eval.    DRUG ALLERGIES:   Allergies  Allergen Reactions  . Ace Inhibitors Cough  . Lisinopril Cough    VITALS:  Blood pressure 133/74, pulse 94, temperature 98.6 F (37 C), resp. rate 19, height 4\' 11"  (1.499 m), weight 97.7 kg (215 lb 6.2 oz), SpO2 100 %.  PHYSICAL EXAMINATION:   Physical Exam  GENERAL:  67 y.o.-year-old obese patient lying in bed in no acute distress.  EYES: Pupils equal, round, reactive to light and accommodation. No scleral icterus. Extraocular muscles intact.  HEENT: Head atraumatic, normocephalic. Oropharynx and nasopharynx clear.  NECK:  Supple, no jugular venous distention. No thyroid enlargement, no tenderness.  LUNGS: Normal breath sounds bilaterally, no wheezing, rales, rhonchi. No use of accessory muscles of respiration.  CARDIOVASCULAR: S1, S2 normal. No  murmurs, rubs, or gallops.  ABDOMEN: Soft, nontender, nondistended. Bowel sounds present. No organomegaly or mass.  EXTREMITIES: No cyanosis, clubbing or edema b/l.  Left knee swelling/pain with limited ROM.    NEUROLOGIC: Cranial nerves II through XII are intact. No focal Motor or sensory deficits b/l.  Globally weak.  PSYCHIATRIC: The patient is alert and oriented x 3.  SKIN: No obvious rash, lesion, or ulcer.    LABORATORY PANEL:   CBC Recent Labs  Lab 06/02/17 0707  WBC 4.9  HGB 10.6*  HCT 32.0*  PLT 177   ------------------------------------------------------------------------------------------------------------------  Chemistries  Recent Labs  Lab 06/01/17 0846 06/02/17 0707  NA 139 140  K 2.8* 2.8*  CL 104 106  CO2 28 24  GLUCOSE 112* 118*  BUN 18 12  CREATININE 1.42* 1.18*  CALCIUM 7.9* 8.2*  MG  --  2.1  AST 21  --   ALT 14  --   ALKPHOS 60  --   BILITOT 0.8  --    ------------------------------------------------------------------------------------------------------------------  Cardiac Enzymes Recent Labs  Lab 06/01/17 0846  TROPONINI <0.03   ------------------------------------------------------------------------------------------------------------------  RADIOLOGY:  Ct Head Wo Contrast  Result Date: 06/01/2017 CLINICAL DATA:  Increased falling over the last day. History of dementia. EXAM: CT HEAD WITHOUT CONTRAST TECHNIQUE: Contiguous axial images were obtained from the base of the skull through the vertex without intravenous contrast. COMPARISON:  02/24/2014 FINDINGS: Brain: Generalized brain atrophy as seen previously. No sign of acute infarction, mass lesion, hemorrhage, hydrocephalus or extra-axial collection. Mild chronic small-vessel change of the hemispheric white matter. Vascular: There is atherosclerotic calcification of the major  vessels at the base of the brain. Skull: Negative Sinuses/Orbits: Clear/normal Other: None IMPRESSION: No acute or  traumatic finding. Atrophy as seen previously. Mild small vessel change of the white matter. Electronically Signed   By: Nelson Chimes M.D.   On: 06/01/2017 09:29   Mr Knee Left Wo Contrast  Result Date: 06/01/2017 CLINICAL DATA:  Left knee pain after fall. EXAM: MRI OF THE LEFT KNEE WITHOUT CONTRAST TECHNIQUE: Multiplanar, multisequence MR imaging of the knee was performed. No intravenous contrast was administered. COMPARISON:  Left knee x-rays from same day. FINDINGS: MENISCI Medial meniscus:  Horizontal tear of the posterior horn. Lateral meniscus: Degenerative signal within the body and posterior horn without discrete tear. LIGAMENTS Cruciates:  Intact ACL and PCL. Collaterals: Medial collateral ligament is intact. Lateral collateral ligament complex is intact. CARTILAGE Patellofemoral: High-grade near full-thickness cartilage loss over the patella and trochlea with underlying patellar subchondral marrow edema. Medial: Small area of delamination over the central weight-bearing medial femoral condyle. No focal defect. Lateral: Partial-thickness cartilage loss over the lateral femoral condyle. No full-thickness defect. Joint: Large joint effusion. Prominent edema versus loculated ganglion cyst in Hoffa's fat. No plical thickening. Popliteal Fossa:  Small Baker cyst.  Intact popliteus tendon. Extensor Mechanism: Intact quadriceps tendon and patellar tendon. Intact medial and lateral patellar retinaculum. Intact MPFL. Bones: No acute fracture or dislocation. Subchondral cystic change along the periphery of the entire medial tibial plateau. Tricompartmental osteophytes. Other: Mild subcutaneous edema about the knee.  No fluid collection. IMPRESSION: 1. Horizontal tear of the medial meniscus posterior horn. 2. 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 3. Large joint effusion.  Small Baker cyst. Electronically Signed   By: Titus Dubin M.D.   On: 06/01/2017 15:02   Dg Knee  Complete 4 Views Left  Result Date: 06/01/2017 CLINICAL DATA:  Fall.  Left knee pain. EXAM: LEFT KNEE - COMPLETE 4+ VIEW COMPARISON:  No recent. FINDINGS: Knee joint effusion may be present. Diffuse osteopenia and tricompartment degenerative change. No acute bony abnormality. Small loose bodies may be present. No acute bony abnormality. No evidence of fracture or dislocation. IMPRESSION: 1. Diffuse osteopenia and tricompartment degenerative change. Small loose bodies may be present. Knee joint effusion may be present. 2.  No acute bony abnormality. Electronically Signed   By: Marcello Moores  Register   On: 06/01/2017 09:39     ASSESSMENT AND PLAN:   67 year old female with past medical history of obesity, dementia, GERD, hypertension, history of renal transplant Who presented to the hospital due to a fall and complaining of left knee pain.  1.  Status post fall and left knee pain- patient's MRI of the left knee yesterday showed a partial medial meniscal tear. -Await further orthopedics input.  Continue supportive care with pain control.  Await physical therapy evaluation.  2.  Hypokalemia- I will increase the patient's potassium supplementation.  Mag level is normal. -Repeat level in the morning.  3. Diarrhea - resolved.  Stool for C. Diff and comp. Culture is (-).   4.  Status post renal transplant- continue Sirolimus, Myfortic.   5.  Essential hypertension-continue Toprol, losartan.  6.  Dementia-continue Aricept.  7.  History of depression-continue Celexa.    All the records are reviewed and case discussed with Care Management/Social Worker. Management plans discussed with the patient, family and they are in agreement.  CODE STATUS: Full code  DVT Prophylaxis: Lovenox  TOTAL TIME TAKING CARE OF THIS PATIENT: 30 minutes.   POSSIBLE D/C IN 1-2 DAYS, DEPENDING  ON CLINICAL CONDITION.   Henreitta Leber M.D on 06/02/2017 at 12:50 PM  Between 7am to 6pm - Pager - [phone]  After  6pm go to [email or web address] - [facility]  Office  [phone]  CC: Primary care physician; Center, [facility]

## 2017-06-03 LAB — POTASSIUM: Potassium: 3.3 mmol/L — ABNORMAL LOW (ref 3.5–5.1)

## 2017-06-03 MED ORDER — OXYCODONE HCL 5 MG PO TABS: 5.0000 mg | ORAL_TABLET | ORAL | 0 refills | Status: DC | PRN

## 2017-06-03 MED ORDER — PREDNISONE 10 MG PO TABS: ORAL_TABLET | ORAL | 0 refills | Status: DC

## 2017-06-03 NOTE — Care Management Note (Signed)
Case Management Note  Patient Details  Name: Tea Collums MRN: [id number] Date of Birth: 12/24/1950  Subjective/Objective:     Admitted to [facility] with the diagnosis of hypokalemia. Daughter lives in the home.                Action/Plan: Physical therapy evaluation completed. Recommending home with home health/physical therapy, [facility]. Brenton Grills, [place] representative updated   Expected Discharge Date:  06/03/17               Expected Discharge Plan:     In-House Referral:     Discharge planning Services   yes  Post Acute Care Choice:   yes Choice offered to:   patient  DME Arranged:    DME Agency:     HH Arranged:   yes [facility] Agency:   [facility]   Status of Service:     If discussed at [place] of Stay Meetings, dates discussed:    Additional Comments:  Shelbie Ammons, RN MSN CCM Care Management [phone] 06/03/2017, 1:05 PM

## 2017-06-03 NOTE — Progress Notes (Signed)
Pt discharged per MD order. IV removed. Discharge instructions reviewed with pt and her husband. [facility] spoke with family in regards to [email or web address] Prescriptions given to pt. All questions answered to pt satisfaction. Pt taken to car in wheelchair by staff.

## 2017-06-03 NOTE — Discharge Summary (Signed)
[facility] NAME: [name]    MR#:  [id number]  DATE OF BIRTH:  1951-02-11  DATE OF ADMISSION:  06/01/2017 ADMITTING PHYSICIAN: Loletha Grayer, MD  DATE OF DISCHARGE: 06/03/2017  PRIMARY CARE PHYSICIAN: [facility]    ADMISSION DIAGNOSIS:  fall  DISCHARGE DIAGNOSIS:  Active Problems:   Hypokalemia   SECONDARY DIAGNOSIS:   Past Medical History:  Diagnosis Date  . Anemia   . Cancer ([place])   . Dementia   . GERD (gastroesophageal reflux disease)   . Hypertension   . Renal disorder     HOSPITAL COURSE:   67 year old female with past medical history of obesity, dementia, GERD, hypertension, history of renal transplant Who presented to the hospital due to a fall and complaining of left knee pain.  1.  Status post fall and left knee pain- patient's MRI of the left knee showed a partial medial meniscal tear. -Seen by orthopedics while in the hospital and after their evaluation and also reviewing the images they did not think the patient had a meniscal tear but this was likely severe osteoarthritis.  The recommended a short course of steroids, NSAIDs and physical therapy. -Patient was seen by physical therapy recommend home health services services which is being arranged for her prior to discharge.  Patient will also be discharged on a short prednisone taper.  Follow-up with orthopedics in the next 2 weeks.  2.  Hypokalemia- supplemented and has improved.  Patient will resume her oral potassium supplements.  3. Diarrhea - resolved.  Stool for C. Diff and comp. Culture was (-).   4.  Status post renal transplant- she will continue Sirolimus, Myfortic.   5.  Essential hypertension- she will continue Toprol, losartan.  6.  Dementia-she will continue Aricept.  7.  History of depression-she will continue Celexa.    DISCHARGE CONDITIONS:   Stable.   CONSULTS OBTAINED:  Treatment Team:   Thornton Park, MD  DRUG ALLERGIES:   Allergies  Allergen Reactions  . Ace Inhibitors Cough  . Lisinopril Cough    DISCHARGE MEDICATIONS:   Allergies as of 06/03/2017      Reactions   Ace Inhibitors Cough   Lisinopril Cough      Medication List    TAKE these medications   atorvastatin 40 MG tablet Commonly known as:  LIPITOR Take 40 mg by mouth at bedtime.   calcitRIOL 0.25 MCG capsule Commonly known as:  ROCALTROL Take 0.25 mcg by mouth daily.   citalopram 20 MG tablet Commonly known as:  CELEXA Take 20 mg by mouth daily.   donepezil 5 MG tablet Commonly known as:  ARICEPT Take 5 mg by mouth every evening.   folic acid 1 MG tablet Commonly known as:  FOLVITE Take 1 mg by mouth 2 (two) times daily.   furosemide 20 MG tablet Commonly known as:  LASIX Take 20 mg by mouth 2 (two) times daily.   losartan 25 MG tablet Commonly known as:  COZAAR Take 25 mg by mouth daily.   metoprolol succinate 25 MG 24 hr tablet Commonly known as:  TOPROL-XL Take 1 tablet (25 mg total) by mouth daily.   mycophenolate 180 MG EC tablet Commonly known as:  MYFORTIC Take 180 mg by mouth 2 (two) times daily.   oxyCODONE 5 MG immediate release tablet Commonly known as:  Oxy IR/ROXICODONE Take 1 tablet (5 mg total) by mouth every 4 (four) hours as needed for moderate  pain or severe pain.   potassium chloride 10 MEQ tablet Commonly known as:  K-DUR,KLOR-CON Take 20 mEq by mouth 2 (two) times daily.   predniSONE 10 MG tablet Commonly known as:  DELTASONE Label  & dispense according to the schedule below. 5 Pills PO for 1 day then, 4 Pills PO for 1 day, 3 Pills PO for 1 day, 2 Pills PO for 1 day, 1 Pill PO for 1 days then STOP.   sirolimus 1 MG tablet Commonly known as:  RAPAMUNE Take 3 mg by mouth daily.   TUMS ULTRA 1000 400 MG chewable tablet Generic drug:  calcium elemental as carbonate Chew 1,000 mg by mouth daily as needed for heartburn.         DISCHARGE  INSTRUCTIONS:   DIET:  Cardiac diet  DISCHARGE CONDITION:  Stable  ACTIVITY:  Activity as tolerated  OXYGEN:  Home Oxygen: No.   Oxygen Delivery: room air  DISCHARGE LOCATION:  Home with Home health PT, RN.    If you experience worsening of your admission symptoms, develop shortness of breath, life threatening emergency, suicidal or homicidal thoughts you must seek medical attention immediately by calling 911 or calling your MD immediately  if symptoms less severe.  You Must read complete instructions/literature along with all the possible adverse reactions/side effects for all the Medicines you take and that have been prescribed to you. Take any new Medicines after you have completely understood and accpet all the possible adverse reactions/side effects.   Please note  You were cared for by a hospitalist during your hospital stay. If you have any questions about your discharge medications or the care you received while you were in the hospital after you are discharged, you can call the unit and asked to speak with the hospitalist on call if the hospitalist that took care of you is not available. Once you are discharged, your primary care physician will handle any further medical issues. Please note that NO REFILLS for any discharge medications will be authorized once you are discharged, as it is imperative that you return to your primary care physician (or establish a relationship with a primary care physician if you do not have one) for your aftercare needs so that they can reassess your need for medications and monitor your lab values.     Today   Has some left  knee pain but improved since yesterday.  Hypokalemia has improved.  Will discharge home with home health services today.  VITAL SIGNS:  Blood pressure 114/61, pulse 69, temperature 98.3 F (36.8 C), temperature source Oral, resp. rate 18, height 4\' 11"  (1.499 m), weight 97.7 kg (215 lb 6.2 oz), SpO2 98 %.  I/O:     Intake/Output Summary (Last 24 hours) at 06/03/2017 1246 Last data filed at 06/03/2017 0438 Gross per 24 hour  Intake 0 ml  Output -  Net 0 ml    PHYSICAL EXAMINATION:    GENERAL:  67 y.o.-year-old obese patient lying in bed in no acute distress.  EYES: Pupils equal, round, reactive to light and accommodation. No scleral icterus. Extraocular muscles intact.  HEENT: Head atraumatic, normocephalic. Oropharynx and nasopharynx clear.  NECK:  Supple, no jugular venous distention. No thyroid enlargement, no tenderness.  LUNGS: Normal breath sounds bilaterally, no wheezing, rales, rhonchi. No use of accessory muscles of respiration.  CARDIOVASCULAR: S1, S2 normal. No murmurs, rubs, or gallops.  ABDOMEN: Soft, nontender, nondistended. Bowel sounds present. No organomegaly or mass.  EXTREMITIES: No cyanosis, clubbing or  edema b/l.  Left knee swelling/pain with limited ROM.    NEUROLOGIC: Cranial nerves II through XII are intact. No focal Motor or sensory deficits b/l.   PSYCHIATRIC: The patient is alert and oriented x 3.  SKIN: No obvious rash, lesion, or ulcer.   DATA REVIEW:   CBC Recent Labs  Lab 06/02/17 0707  WBC 4.9  HGB 10.6*  HCT 32.0*  PLT 177    Chemistries  Recent Labs  Lab 06/01/17 0846 06/02/17 0707 06/03/17 0514  NA 139 140  --   K 2.8* 2.8* 3.3*  CL 104 106  --   CO2 28 24  --   GLUCOSE 112* 118*  --   BUN 18 12  --   CREATININE 1.42* 1.18*  --   CALCIUM 7.9* 8.2*  --   MG  --  2.1  --   AST 21  --   --   ALT 14  --   --   ALKPHOS 60  --   --   BILITOT 0.8  --   --     Cardiac Enzymes Recent Labs  Lab 06/01/17 0846  TROPONINI <0.03    Microbiology Results  Results for orders placed or performed during the hospital encounter of 06/01/17  Gastrointestinal Panel by PCR , Stool     Status: None   Collection Time: 06/01/17  7:49 PM  Result Value Ref Range Status   Campylobacter species NOT DETECTED NOT DETECTED Final   Plesimonas shigelloides  NOT DETECTED NOT DETECTED Final   Salmonella species NOT DETECTED NOT DETECTED Final   Yersinia enterocolitica NOT DETECTED NOT DETECTED Final   Vibrio species NOT DETECTED NOT DETECTED Final   Vibrio cholerae NOT DETECTED NOT DETECTED Final   Enteroaggregative E coli (EAEC) NOT DETECTED NOT DETECTED Final   Enteropathogenic E coli (EPEC) NOT DETECTED NOT DETECTED Final   Enterotoxigenic E coli (ETEC) NOT DETECTED NOT DETECTED Final   Shiga like toxin producing E coli (STEC) NOT DETECTED NOT DETECTED Final   Shigella/Enteroinvasive E coli (EIEC) NOT DETECTED NOT DETECTED Final   Cryptosporidium NOT DETECTED NOT DETECTED Final   Cyclospora cayetanensis NOT DETECTED NOT DETECTED Final   Entamoeba histolytica NOT DETECTED NOT DETECTED Final   Giardia lamblia NOT DETECTED NOT DETECTED Final   Adenovirus F40/41 NOT DETECTED NOT DETECTED Final   Astrovirus NOT DETECTED NOT DETECTED Final   Norovirus GI/GII NOT DETECTED NOT DETECTED Final   Rotavirus A NOT DETECTED NOT DETECTED Final   Sapovirus (I, II, IV, and V) NOT DETECTED NOT DETECTED Final    Comment: Performed at [facility], [address] [place], [place] [address]  C difficile quick scan w PCR reflex     Status: None   Collection Time: 06/01/17  7:49 PM  Result Value Ref Range Status   C Diff antigen NEGATIVE NEGATIVE Final   C Diff toxin NEGATIVE NEGATIVE Final   C Diff interpretation No C. difficile detected.  Final    Comment: Performed at [facility], [address] [place], [place] [address]    RADIOLOGY:  Mr Knee Left Wo Contrast  Result Date: 06/01/2017 CLINICAL DATA:  Left knee pain after fall. EXAM: MRI OF THE LEFT KNEE WITHOUT CONTRAST TECHNIQUE: Multiplanar, multisequence MR imaging of the knee was performed. No intravenous contrast was administered. COMPARISON:  Left knee x-rays from same day. FINDINGS: MENISCI Medial meniscus:  Horizontal tear of the posterior horn. Lateral meniscus:  Degenerative signal within the body and  posterior horn without discrete tear. LIGAMENTS Cruciates:  Intact ACL and PCL. Collaterals: Medial collateral ligament is intact. Lateral collateral ligament complex is intact. CARTILAGE Patellofemoral: High-grade near full-thickness cartilage loss over the patella and trochlea with underlying patellar subchondral marrow edema. Medial: Small area of delamination over the central weight-bearing medial femoral condyle. No focal defect. Lateral: Partial-thickness cartilage loss over the lateral femoral condyle. No full-thickness defect. Joint: Large joint effusion. Prominent edema versus loculated ganglion cyst in Hoffa's fat. No plical thickening. Popliteal Fossa:  Small Baker cyst.  Intact popliteus tendon. Extensor Mechanism: Intact quadriceps tendon and patellar tendon. Intact medial and lateral patellar retinaculum. Intact MPFL. Bones: No acute fracture or dislocation. Subchondral cystic change along the periphery of the entire medial tibial plateau. Tricompartmental osteophytes. Other: Mild subcutaneous edema about the knee.  No fluid collection. IMPRESSION: 1. Horizontal tear of the medial meniscus posterior horn. 2. 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 3. Large joint effusion.  Small Baker cyst. Electronically Signed   By: Titus Dubin M.D.   On: 06/01/2017 15:02      Management plans discussed with the patient, family and they are in agreement.  CODE STATUS:     Code Status Orders  (From admission, onward)        Start     Ordered   06/01/17 1402  Full code  Continuous     06/01/17 1402    TOTAL TIME TAKING CARE OF THIS PATIENT: 40 minutes.    Henreitta Leber M.D on 06/03/2017 at 12:46 PM  Between 7am to 6pm - Pager - [phone]  After 6pm go to [email or web address] - [employer] [facility]  Office  [phone]  CC: Primary care physician; [facility]

## 2017-06-03 NOTE — Evaluation (Signed)
Physical Therapy Evaluation Patient Details Name: [name] MRN: [id number] DOB: 06/24/1950 Today's Date: 06/03/2017   History of Present Illness  Pt is a 67 y/o F who presented s/p fall and L knee pain. Pt reports pain in L knee is chronic but is much worse after fall.  Imaging revealed tricompartmental osteoarthritis, horizontal tear of medial meniscus posterior horn, small baker cyst, large joint effusion. Pt reports having some recent dirrhea.  Pt's PMH includes dementia, kidney transplat, cancer.    Clinical Impression  Pt admitted with above diagnosis. Pt currently with functional limitations due to the deficits listed below (see PT Problem List). [name] is a poor historian and presents with short term memory loss, making her an unreliable historian.  Pt currently requires min assist for bed mobility and sit<>stand.  She requires close min guard assist when ambulating with RW. The pt demonstrates very poor safety awareness and safety is also affected by her short term memory loss.  Thus recommending 24/7 assist/supervision at d/c. Pt will benefit from skilled PT to increase their independence and safety with mobility to allow discharge to the venue listed below.      Follow Up Recommendations Home health PT;Supervision/Assistance - 24 hour    Equipment Recommendations  None recommended by PT(pt reports she has a RW at home)    Recommendations for Other Services       Precautions / Restrictions Precautions Precautions: Fall Required Braces or Orthoses: Knee Immobilizer - Left Knee Immobilizer - Left: Other (comment)(If pt unable to WB through LLE, PT to wean (per order)) Restrictions Weight Bearing Restrictions: Yes LLE Weight Bearing: Weight bearing as tolerated      Mobility  Bed Mobility Overal bed mobility: Needs Assistance Bed Mobility: Supine to Sit     Supine to sit: Min assist;HOB elevated     General bed mobility comments: Cues for sequencing and pt  requires assist to bring LLE to EOB.  Pt uses bed rail to pull to sitting.   Transfers Overall transfer level: Needs assistance Equipment used: Rolling walker (2 wheeled) Transfers: Sit to/from Stand Sit to Stand: Min assist;Min guard;From elevated surface         General transfer comment: Bed slightly elevated to bed level height at home and pt requires min assist to boost to standing.  Pt requires cues for hand placement.  Min guard to stand from [facility].   Ambulation/Gait Ambulation/Gait assistance: Min guard Ambulation Distance (Feet): 80 Feet Assistive device: Rolling walker (2 wheeled) Gait Pattern/deviations: Step-to pattern;Decreased stride length;Decreased weight shift to left;Decreased stance time - left;Antalgic;Trunk flexed Gait velocity: decreased Gait velocity interpretation: <1.31 ft/sec, indicative of household ambulator General Gait Details: Pt requries mod verbal cues to keep feet within BOS of RW as she has tendency to push RW too far ahead and does not keep feet inside of RW when turning.  Pt requires cues for upright posture and for sequencing using RW.    Stairs            Wheelchair Mobility    Modified Rankin (Stroke Patients Only)       Balance Overall balance assessment: Needs assistance Sitting-balance support: No upper extremity supported;Feet supported Sitting balance-Leahy Scale: Good     Standing balance support: During functional activity;No upper extremity supported Standing balance-Leahy Scale: Fair Standing balance comment: Pt able to stand statically without UE support but does require UE assit for dynamic activities  Pertinent Vitals/Pain Pain Assessment: Faces Faces Pain Scale: Hurts little more Pain Location: L knee Pain Descriptors / Indicators: Aching;Guarding Pain Intervention(s): Limited activity within patient's tolerance;Monitored during session;Repositioned    Home Living  Family/patient expects to be discharged to:: Private residence Living Arrangements: Children(daughter) Available Help at Discharge: Available PRN/intermittently(daughter works during the day) Type of Home: Apartment Home Access: Stairs to enter Entrance Stairs-Rails: Right Entrance Stairs-Number of Steps: 2 Home Layout: One level Home Equipment: Environmental consultant - 2 wheels;Wheelchair - manual Additional Comments: Pt lives with her daughter who goes to work during the day. The pt says that her and her daughter do not get along well.  Pt reports her husband lives nearby and can provide assist during the day if needed.     Prior Function Level of Independence: Independent         Comments: Pt retired.  Pt ambulates without AD, no other falls in the past 6 months. Pt has never driven. Pt does the cooking and cleaning.       Hand Dominance   Dominant Hand: Right    Extremity/Trunk Assessment   Upper Extremity Assessment Upper Extremity Assessment: Overall WFL for tasks assessed    Lower Extremity Assessment Lower Extremity Assessment: LLE deficits/detail LLE Deficits / Details: Significant L knee swelling with ace bandage wrap.  Pt requires assist to perform SLR and heel slides LLE: Unable to fully assess due to pain    Cervical / Trunk Assessment Cervical / Trunk Assessment: Kyphotic  Communication   Communication: No difficulties  Cognition Arousal/Alertness: Awake/alert Behavior During Therapy: WFL for tasks assessed/performed Overall Cognitive Status: History of cognitive impairments - at baseline                                 General Comments: Short term memory loss.  When taking history pt has to think if her husband lives with her or not.       General Comments      Exercises General Exercises - Lower Extremity Ankle Circles/Pumps: AROM;Both;10 reps;Supine Quad Sets: Strengthening;Both;5 reps;Supine Heel Slides: AAROM;Left;5 reps;Supine Straight Leg  Raises: AAROM;Left;5 reps;Supine   Assessment/Plan    PT Assessment Patient needs continued PT services  PT Problem List Decreased strength;Decreased range of motion;Decreased activity tolerance;Decreased balance;Decreased mobility;Decreased cognition;Decreased knowledge of use of DME;Decreased safety awareness;Decreased knowledge of precautions;Pain       PT Treatment Interventions DME instruction;Gait training;Stair training;Functional mobility training;Therapeutic activities;Therapeutic exercise;Balance training;Neuromuscular re-education;Cognitive remediation;Patient/family education;Wheelchair mobility training;Modalities    PT Goals (Current goals can be found in the Care Plan section)  Acute Rehab PT Goals Patient Stated Goal: to return to PLOF PT Goal Formulation: With patient Time For Goal Achievement: 06/17/17 Potential to Achieve Goals: Good    Frequency 7X/week   Barriers to discharge Other (comment) Unsure of the amount of assist available, pt is a poor historian    Co-evaluation               AM-PAC PT "6 Clicks" Daily Activity  Outcome Measure Difficulty turning over in bed (including adjusting bedclothes, sheets and blankets)?: A Little Difficulty moving from lying on back to sitting on the side of the bed? : A Little Difficulty sitting down on and standing up from a chair with arms (e.g., wheelchair, bedside commode, etc,.)?: A Little Help needed moving to and from a bed to chair (including a wheelchair)?: Total Help needed walking in hospital room?: A Lot Help  needed climbing 3-5 steps with a railing? : Total 6 Click Score: 13    End of Session Equipment Utilized During Treatment: Gait belt;Left knee immobilizer Activity Tolerance: Patient tolerated treatment well Patient left: in chair;with call bell/phone within reach;with chair alarm set Nurse Communication: Mobility status;Weight bearing status PT Visit Diagnosis: Pain;Unsteadiness on feet  (R26.81);Other abnormalities of gait and mobility (R26.89);Muscle weakness (generalized) (M62.81) Pain - Right/Left: Left Pain - part of body: Knee    Time: 3794-3276 PT Time Calculation (min) (ACUTE ONLY): 46 min   Charges:   PT Evaluation $PT Eval Low Complexity: 1 Low PT Treatments $Gait Training: 8-22 mins $Therapeutic Activity: 8-22 mins   PT G Codes:        Collie Siad PT, DPT 06/03/2017, 11:09 AM

## 2017-06-06 ENCOUNTER — Telehealth: Payer: Self-pay

## 2017-06-06 NOTE — Telephone Encounter (Signed)
Flagged on EMMI report for not having a follow up scheduled.  1st attempt to reach patient made 06/06/17 at 11:46am, however had to leave voicemail encouraging callback.  Will attempt at later time.

## 2017-06-07 NOTE — Telephone Encounter (Signed)
Reached patient upon 2nd attempt on 06/07/17 at 11:45am.  She has a follow up scheduled with [facility] on 06/15/17 at [place] number for Dr. Alfonso Ramus office to schedule follow up appointment mentioned on discharge papers.  No further questions or concerns at this time.  I thanked her for her time and informed her that she would receive one more automated call in the next few days checking on her a final time.

## 2017-10-01 ENCOUNTER — Encounter: Payer: Self-pay | Admitting: Emergency Medicine

## 2017-10-01 ENCOUNTER — Emergency Department
Admission: EM | Admit: 2017-10-01 | Discharge: 2017-10-01 | Disposition: A | Discharge status: Home / Self Care | Payer: Medicare Other | Attending: Emergency Medicine | Admitting: Emergency Medicine

## 2017-10-01 ENCOUNTER — Other Ambulatory Visit: Payer: Self-pay

## 2017-10-01 DIAGNOSIS — R109 Unspecified abdominal pain: Secondary | ICD-10-CM | POA: Diagnosis present

## 2017-10-01 DIAGNOSIS — Z5321 Procedure and treatment not carried out due to patient leaving prior to being seen by health care provider: Secondary | ICD-10-CM | POA: Insufficient documentation

## 2017-10-01 LAB — CBC
HCT: 30.1 % — ABNORMAL LOW (ref 35.0–47.0)
Hemoglobin: 9.8 g/dL — ABNORMAL LOW (ref 12.0–16.0)
MCH: 25 pg — ABNORMAL LOW (ref 26.0–34.0)
MCHC: 32.6 g/dL (ref 32.0–36.0)
MCV: 76.8 fL — AB (ref 80.0–100.0)
PLATELETS: 181 10*3/uL (ref 150–440)
RBC: 3.91 MIL/uL (ref 3.80–5.20)
RDW: 15.6 % — ABNORMAL HIGH (ref 11.5–14.5)
WBC: 4 10*3/uL (ref 3.6–11.0)

## 2017-10-01 LAB — COMPREHENSIVE METABOLIC PANEL
ALBUMIN: 3.5 g/dL (ref 3.5–5.0)
ALK PHOS: 59 U/L (ref 38–126)
ALT: 10 U/L (ref 0–44)
AST: 18 U/L (ref 15–41)
Anion gap: 9 (ref 5–15)
BUN: 26 mg/dL — AB (ref 8–23)
CALCIUM: 8.9 mg/dL (ref 8.9–10.3)
CHLORIDE: 105 mmol/L (ref 98–111)
CO2: 28 mmol/L (ref 22–32)
CREATININE: 1.31 mg/dL — AB (ref 0.44–1.00)
GFR calc Af Amer: 48 mL/min — ABNORMAL LOW (ref >60)
GFR calc non Af Amer: 41 mL/min — ABNORMAL LOW (ref >60)
GLUCOSE: 107 mg/dL — AB (ref 70–99)
Potassium: 3.7 mmol/L (ref 3.5–5.1)
SODIUM: 142 mmol/L (ref 135–145)
Total Bilirubin: 0.4 mg/dL (ref 0.3–1.2)
Total Protein: 7.2 g/dL (ref 6.5–8.1)

## 2017-10-01 LAB — LIPASE, BLOOD: Lipase: 25 U/L (ref 11–51)

## 2017-10-01 NOTE — ED Triage Notes (Signed)
Pt comes into the ED via [facility] from home c/o RUQ abdominal pain that also caused some shortness of breath.  All symptoms since then have resolved.  Patient is a right kidney transplant patient.  Patient in NAD at this time with even and unlabored respirations. Patient denies any pain anymore.

## 2017-10-01 NOTE — ED Notes (Addendum)
IV removed per patient request.  (Task delegated by RN Arrie Aran) Catheter intact. Skin clean and dry at site.

## 2017-10-02 ENCOUNTER — Telehealth: Payer: Self-pay | Admitting: Emergency Medicine

## 2017-10-02 NOTE — Telephone Encounter (Signed)
Called patient due to lwot to inquire about condition and follow up plans. Left message.   

## 2017-10-07 ENCOUNTER — Emergency Department: Payer: Medicare Other

## 2017-10-07 ENCOUNTER — Emergency Department
Admission: EM | Admit: 2017-10-07 | Discharge: 2017-10-07 | Disposition: A | Discharge status: Home / Self Care | Payer: Medicare Other | Attending: Emergency Medicine | Admitting: Emergency Medicine

## 2017-10-07 ENCOUNTER — Other Ambulatory Visit: Payer: Self-pay

## 2017-10-07 DIAGNOSIS — Z940 Kidney transplant status: Secondary | ICD-10-CM | POA: Diagnosis not present

## 2017-10-07 DIAGNOSIS — I10 Essential (primary) hypertension: Secondary | ICD-10-CM | POA: Diagnosis not present

## 2017-10-07 DIAGNOSIS — M549 Dorsalgia, unspecified: Secondary | ICD-10-CM | POA: Diagnosis present

## 2017-10-07 DIAGNOSIS — M545 Low back pain, unspecified: Secondary | ICD-10-CM

## 2017-10-07 DIAGNOSIS — Z79899 Other long term (current) drug therapy: Secondary | ICD-10-CM | POA: Insufficient documentation

## 2017-10-07 DIAGNOSIS — R52 Pain, unspecified: Secondary | ICD-10-CM

## 2017-10-07 LAB — LIPASE, BLOOD: Lipase: 22 U/L (ref 11–51)

## 2017-10-07 LAB — CBC WITH DIFFERENTIAL/PLATELET
BASOS ABS: 0 10*3/uL (ref 0–0.1)
BASOS PCT: 1 %
EOS ABS: 0.1 10*3/uL (ref 0–0.7)
EOS PCT: 2 %
HCT: 30.2 % — ABNORMAL LOW (ref 35.0–47.0)
Hemoglobin: 10.1 g/dL — ABNORMAL LOW (ref 12.0–16.0)
Lymphocytes Relative: 19 %
Lymphs Abs: 0.8 10*3/uL — ABNORMAL LOW (ref 1.0–3.6)
MCH: 25.6 pg — ABNORMAL LOW (ref 26.0–34.0)
MCHC: 33.3 g/dL (ref 32.0–36.0)
MCV: 76.7 fL — ABNORMAL LOW (ref 80.0–100.0)
MONO ABS: 0.4 10*3/uL (ref 0.2–0.9)
MONOS PCT: 10 %
Neutro Abs: 3 10*3/uL (ref 1.4–6.5)
Neutrophils Relative %: 68 %
Platelets: 180 10*3/uL (ref 150–440)
RBC: 3.93 MIL/uL (ref 3.80–5.20)
RDW: 15.6 % — AB (ref 11.5–14.5)
WBC: 4.3 10*3/uL (ref 3.6–11.0)

## 2017-10-07 LAB — COMPREHENSIVE METABOLIC PANEL
ALT: 12 U/L (ref 0–44)
AST: 17 U/L (ref 15–41)
Albumin: 3.5 g/dL (ref 3.5–5.0)
Alkaline Phosphatase: 61 U/L (ref 38–126)
Anion gap: 6 (ref 5–15)
BILIRUBIN TOTAL: 0.5 mg/dL (ref 0.3–1.2)
BUN: 19 mg/dL (ref 8–23)
CO2: 27 mmol/L (ref 22–32)
CREATININE: 1.23 mg/dL — AB (ref 0.44–1.00)
Calcium: 8.7 mg/dL — ABNORMAL LOW (ref 8.9–10.3)
Chloride: 106 mmol/L (ref 98–111)
GFR, EST AFRICAN AMERICAN: 51 mL/min — AB (ref >60)
GFR, EST NON AFRICAN AMERICAN: 44 mL/min — AB (ref >60)
Glucose, Bld: 94 mg/dL (ref 70–99)
Potassium: 3.4 mmol/L — ABNORMAL LOW (ref 3.5–5.1)
Sodium: 139 mmol/L (ref 135–145)
TOTAL PROTEIN: 6.9 g/dL (ref 6.5–8.1)

## 2017-10-07 LAB — URINALYSIS, COMPLETE (UACMP) WITH MICROSCOPIC
Bacteria, UA: NONE SEEN
Bilirubin Urine: NEGATIVE
Glucose, UA: NEGATIVE mg/dL
HGB URINE DIPSTICK: NEGATIVE
KETONES UR: 5 mg/dL — AB
Leukocytes, UA: NEGATIVE
Nitrite: NEGATIVE
PH: 5 (ref 5.0–8.0)
PROTEIN: NEGATIVE mg/dL
Specific Gravity, Urine: 1.009 (ref 1.005–1.030)

## 2017-10-07 LAB — AMMONIA: Ammonia: <9 umol/L — ABNORMAL LOW (ref 9–35)

## 2017-10-07 LAB — TROPONIN I: Troponin I: <0.03 ng/mL (ref <0.03)

## 2017-10-07 MED ORDER — HYDROCODONE-ACETAMINOPHEN 5-325 MG PO TABS: 1.0000 | ORAL_TABLET | Freq: Four times a day (QID) | ORAL | 0 refills | Status: DC | PRN

## 2017-10-07 MED ORDER — HYDROCODONE-ACETAMINOPHEN 5-325 MG PO TABS
1.0000 | ORAL_TABLET | Freq: Once | ORAL | Status: AC
  Administered 2017-10-07: 1 via ORAL
  Filled 2017-10-07: qty 1

## 2017-10-07 MED ORDER — FENTANYL CITRATE (PF) 100 MCG/2ML IJ SOLN
50.0000 ug | Freq: Once | INTRAMUSCULAR | Status: AC
  Administered 2017-10-07: 50 ug via INTRAVENOUS
  Filled 2017-10-07: qty 2

## 2017-10-07 MED ORDER — LIDOCAINE 5 % EX PTCH: 1.0000 | MEDICATED_PATCH | Freq: Two times a day (BID) | CUTANEOUS | 0 refills | Status: DC

## 2017-10-07 NOTE — Discharge Instructions (Signed)
Fortunately today your blood work and your urinalysis were reassuring.  Please take your pain medication as needed for severe symptoms and follow-up with orthopedic surgery as needed for reevaluation.  Return to the emergency department sooner for any concerns.  It was a pleasure to take care of you today, and thank you for coming to our emergency department.  If you have any questions or concerns before leaving please ask the nurse to grab me and I'm more than happy to go through your aftercare instructions again.  If you were prescribed any opioid pain medication today such as Norco, Vicodin, Percocet, morphine, hydrocodone, or oxycodone please make sure you do not drive when you are taking this medication as it can alter your ability to drive safely.  If you have any concerns once you are home that you are not improving or are in fact getting worse before you can make it to your follow-up appointment, please do not hesitate to call [phone] and come back for further evaluation.  Darel Hong, MD  Results for orders placed or performed during the hospital encounter of 10/07/17  Urinalysis, Complete w Microscopic  Result Value Ref Range   Color, Urine STRAW (A) YELLOW   APPearance CLEAR (A) CLEAR   Specific Gravity, Urine 1.009 1.005 - 1.030   pH 5.0 5.0 - 8.0   Glucose, UA NEGATIVE NEGATIVE mg/dL   Hgb urine dipstick NEGATIVE NEGATIVE   Bilirubin Urine NEGATIVE NEGATIVE   Ketones, ur 5 (A) NEGATIVE mg/dL   Protein, ur NEGATIVE NEGATIVE mg/dL   Nitrite NEGATIVE NEGATIVE   Leukocytes, UA NEGATIVE NEGATIVE   RBC / HPF 0-5 0 - 5 RBC/hpf   WBC, UA 0-5 0 - 5 WBC/hpf   Bacteria, UA NONE SEEN NONE SEEN   Squamous Epithelial / LPF 0-5 0 - 5   Mucus PRESENT   Comprehensive metabolic panel  Result Value Ref Range   Sodium 139 135 - 145 mmol/L   Potassium 3.4 (L) 3.5 - 5.1 mmol/L   Chloride 106 98 - 111 mmol/L   CO2 27 22 - 32 mmol/L   Glucose, Bld 94 70 - 99 mg/dL   BUN 19 8 - 23 mg/dL   Creatinine, Ser 1.23 (H) 0.44 - 1.00 mg/dL   Calcium 8.7 (L) 8.9 - 10.3 mg/dL   Total Protein 6.9 6.5 - 8.1 g/dL   Albumin 3.5 3.5 - 5.0 g/dL   AST 17 15 - 41 U/L   ALT 12 0 - 44 U/L   Alkaline Phosphatase 61 38 - 126 U/L   Total Bilirubin 0.5 0.3 - 1.2 mg/dL   GFR calc non Af Amer 44 (L) >60 mL/min   GFR calc Af Amer 51 (L) >60 mL/min   Anion gap 6 5 - 15  Lipase, blood  Result Value Ref Range   Lipase 22 11 - 51 U/L  Troponin I  Result Value Ref Range   Troponin I <0.03 <0.03 ng/mL  CBC with Differential  Result Value Ref Range   WBC 4.3 3.6 - 11.0 K/uL   RBC 3.93 3.80 - 5.20 MIL/uL   Hemoglobin 10.1 (L) 12.0 - 16.0 g/dL   HCT 30.2 (L) 35.0 - 47.0 %   MCV 76.7 (L) 80.0 - 100.0 fL   MCH 25.6 (L) 26.0 - 34.0 pg   MCHC 33.3 32.0 - 36.0 g/dL   RDW 15.6 (H) 11.5 - 14.5 %   Platelets 180 150 - 440 K/uL   Neutrophils Relative % 68 %  Neutro Abs 3.0 1.4 - 6.5 K/uL   Lymphocytes Relative 19 %   Lymphs Abs 0.8 (L) 1.0 - 3.6 K/uL   Monocytes Relative 10 %   Monocytes Absolute 0.4 0.2 - 0.9 K/uL   Eosinophils Relative 2 %   Eosinophils Absolute 0.1 0 - 0.7 K/uL   Basophils Relative 1 %   Basophils Absolute 0.0 0 - 0.1 K/uL  Ammonia  Result Value Ref Range   Ammonia <9 (L) 9 - 35 umol/L   Ct L-spine No Charge  Result Date: 10/07/2017 CLINICAL DATA:  Right-sided low back pain beginning yesterday. History of renal transplant. EXAM: CT LUMBAR SPINE WITHOUT CONTRAST TECHNIQUE: Multidetector CT imaging of the lumbar spine was performed without intravenous contrast administration. Multiplanar CT image reconstructions were also generated. COMPARISON:  06/29/2016 FINDINGS: Segmentation: 5 lumbar type vertebral bodies. Alignment: Normal except for 2 mm of anterolisthesis L4-5. Vertebrae: No fracture or acute bone finding. Paraspinal and other soft tissues: See results of CT abdomen. Disc levels: No significant finding from T11-12 through L2-3. L3-4: Disc bulge with annular calcification.  Minimal facet hypertrophy. No compressive stenosis. L4-5: Bilateral facet arthropathy with 2 mm of anterolisthesis. Circumferential bulging of the disc. Multifactorial spinal stenosis that could cause neural compression. L5-S1: Bulging of the disc. Bilateral facet degeneration. Mild narrowing of the subarticular lateral recesses without definite neural compression. IMPRESSION: No evidence of fracture or other acute finding. L4-5 spinal stenosis. Bilateral facet arthropathy with 2 mm of anterolisthesis. Bulging of the disc. Neural compression could occur at this level. The appearance could worsen with standing or flexion. Electronically Signed   By: Nelson Chimes M.D.   On: 10/07/2017 13:07   Ct Renal Stone Study  Result Date: 10/07/2017 CLINICAL DATA:  Right-sided low back pain beginning yesterday. Previous kidney transplant. EXAM: CT ABDOMEN AND PELVIS WITHOUT CONTRAST TECHNIQUE: Multidetector CT imaging of the abdomen and pelvis was performed following the standard protocol without IV contrast. COMPARISON:  06/29/2016 FINDINGS: Lower chest: Moderate to large pericardial effusion as seen previously. Lung bases are clear. Hepatobiliary: Liver parenchyma appears normal. Previous cholecystectomy. Pancreas: Normal Spleen: Normal Adrenals/Urinary Tract: Adrenal glands are normal. Left kidney is surgically absent. Native right kidney is. Atrophic without focal or significant finding. Right iliac fossa transplant kidney appears normal by CT. No evidence of swelling or hydronephrosis. Bladder appears normal. Stomach/Bowel: Pills in the stomach. No sign of bowel obstruction or inflammation. Vascular/Lymphatic: Aortic atherosclerosis.  No aneurysm. Reproductive: Previous hysterectomy. No pelvic mass or lymphadenopathy. Other: No free fluid or air.  Previous ventral hernia repair. Musculoskeletal: Lumbar degenerative changes as described on that report. No acute pelvic finding. IMPRESSION: No acute finding. Transplant  kidney in the right iliac fossa without evidence of swelling or hydronephrosis. Moderate to large pericardial effusion as seen on the previous study as well. Aortic atherosclerosis. Previous hysterectomy and cholecystectomy. Electronically Signed   By: Nelson Chimes M.D.   On: 10/07/2017 13:11

## 2017-10-07 NOTE — ED Provider Notes (Signed)
[facility] Emergency Department Provider Note  ____________________________________________   First MD Initiated Contact with Patient 10/07/17 1146     (approximate)  I have reviewed the triage vital signs and the nursing notes.   HISTORY  Chief Complaint Back Pain  Level 5 exemption history is limited as the patient appears to be confused  HPI [name] is a 67 y.o. female who comes to the emergency department by EMS with 15 minutes of right mid back pain.  She is a challenging historian and at first says the pain began 15 minutes prior to calling EMS and she subsequently then said she actually had pain beginning last night.  The pain is sharp and cramping in her mid right back.  She is not sure if it radiates.  She says the pain is not constant but she is not sure what makes it better or worse.  She asked me to direct all of these questions to her daughter who is not yet at bedside.  The patient has a past medical history of a renal transplant although she is not sure when but thinks it was "at least may be a few years ago".  She is not sure what medication she takes.  She does say she has a long-standing history of back pain but she is not sure if she is ever had x-rays, what her diagnosis is, or what any doctors have done for it.    Past Medical History:  Diagnosis Date  . Anemia   . Cancer ([place])   . Dementia   . GERD (gastroesophageal reflux disease)   . Hypertension   . Renal disorder     Patient Active Problem List   Diagnosis Date Noted  . Hypokalemia 06/01/2017  . Sepsis ([place]) 06/29/2016  . UTI (urinary tract infection) 06/29/2016  . GERD (gastroesophageal reflux disease) 06/29/2016  . Dementia 06/29/2016  . 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 Essential hypertension 07/06/2014  . H/O kidney transplant 07/06/2014    Past Surgical History:  Procedure Laterality Date  . ABDOMINAL HYSTERECTOMY    . AV FISTULA PLACEMENT    .  BREAST BIOPSY     Patient is unsure but she thinks she remember a Bx  . KIDNEY TRANSPLANT    . NEPHRECTOMY TRANSPLANTED ORGAN      Prior to Admission medications   Medication Sig Start Date End Date Taking? Authorizing Provider  atorvastatin (LIPITOR) 40 MG tablet Take 40 mg by mouth at bedtime.    [provider]  calcitRIOL (ROCALTROL) 0.25 MCG capsule Take 0.25 mcg by mouth daily.    [provider]  calcium elemental as carbonate (TUMS ULTRA 1000) 400 MG chewable tablet Chew 1,000 mg by mouth daily as needed for heartburn.    [provider]  citalopram (CELEXA) 20 MG tablet Take 20 mg by mouth daily.    [provider]  donepezil (ARICEPT) 5 MG tablet Take 5 mg by mouth every evening.  04/05/16   [provider]  folic acid (FOLVITE) 1 MG tablet Take 1 mg by mouth 2 (two) times daily.     [provider]  furosemide (LASIX) 20 MG tablet Take 20 mg by mouth 2 (two) times daily.    [provider]  HYDROcodone-acetaminophen (NORCO) 5-325 MG tablet Take 1 tablet by mouth every 6 (six) hours as needed for up to 7 doses for severe pain. 10/07/17   Darel Hong, MD  lidocaine (LIDODERM) 5 % Place 1  patch onto the skin every 12 (twelve) hours. Remove & Discard patch within 12 hours or as directed by MD 10/07/17 10/07/18  Darel Hong, MD  losartan (COZAAR) 25 MG tablet Take 25 mg by mouth daily.    [provider]  metoprolol succinate (TOPROL-XL) 25 MG 24 hr tablet Take 1 tablet (25 mg total) by mouth daily. 07/02/16   Gladstone Lighter, MD  mycophenolate (MYFORTIC) 180 MG EC tablet Take 180 mg by mouth 2 (two) times daily.    [provider]  oxyCODONE (OXY IR/ROXICODONE) 5 MG immediate release tablet Take 1 tablet (5 mg total) by mouth every 4 (four) hours as needed for moderate pain or severe pain. 06/03/17   Henreitta Leber, MD  potassium chloride (K-DUR,KLOR-CON) 10 MEQ tablet Take 20 mEq by mouth 2 (two)  times daily.    [provider]  predniSONE (DELTASONE) 10 MG tablet Label  & dispense according to the schedule below. 5 Pills PO for 1 day then, 4 Pills PO for 1 day, 3 Pills PO for 1 day, 2 Pills PO for 1 day, 1 Pill PO for 1 days then STOP. 06/03/17   Sainani, Belia Heman, MD  sirolimus (RAPAMUNE) 1 MG tablet Take 3 mg by mouth daily.     [provider]    Allergies Ace inhibitors and Lisinopril  Family History  Problem Relation Age of Onset  . Stroke Mother   . Alcohol abuse Father   . Breast cancer Neg Hx     Social History Social History   Tobacco Use  . Smoking status: Former Research scientist (life sciences)  . Smokeless tobacco: Never Used  Substance Use Topics  . Alcohol use: No  . Drug use: No    Review of Systems Level 5 exemption history limited by the patient's confusion  ____________________________________________   PHYSICAL EXAM:  VITAL SIGNS: ED Triage Vitals [10/07/17 1144]  Enc Vitals Group     BP      Pulse      Resp      Temp      Temp src      SpO2 100 %     Weight      Height      Head Circumference      Peak Flow      Pain Score      Pain Loc      Pain Edu?      Excl. in [place]?     Constitutional: Pleasant and cooperative although speaks in slow methodical sentences and seems somewhat confused Eyes: PERRL EOMI. Head: Atraumatic. Nose: No congestion/rhinnorhea. Mouth/Throat: No trismus Neck: No stridor.   Cardiovascular: Normal rate, regular rhythm. Grossly normal heart sounds.  Good peripheral circulation. Respiratory: Normal respiratory effort.  No retractions. 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  She has mild focal musculoskeletal pain to her mid back on the right Neurologic:  Normal speech and language. No gross focal neurologic deficits are appreciated. Skin:  Skin is warm, dry and intact. No rash noted. Psychiatric: Seems somewhat  confused   ____________________________________________   DIFFERENTIAL includes but not limited to  Pyelonephritis, musculoskeletal pain, radicular pain, nephrolithiasis ____________________________________________   LABS (all labs ordered are listed, but only abnormal results are displayed)  Labs Reviewed  URINALYSIS, COMPLETE (UACMP) WITH MICROSCOPIC - Abnormal; Notable for the following components:      Result Value   Color, Urine STRAW (*)    APPearance CLEAR (*)  Ketones, ur 5 (*)    All other components within normal limits  COMPREHENSIVE METABOLIC PANEL - Abnormal; Notable for the following components:   Potassium 3.4 (*)    Creatinine, Ser 1.23 (*)    Calcium 8.7 (*)    GFR calc non Af Amer 44 (*)    GFR calc Af Amer 51 (*)    All other components within normal limits  CBC WITH DIFFERENTIAL/PLATELET - Abnormal; Notable for the following components:   Hemoglobin 10.1 (*)    HCT 30.2 (*)    MCV 76.7 (*)    MCH 25.6 (*)    RDW 15.6 (*)    Lymphs Abs 0.8 (*)    All other components within normal limits  AMMONIA - Abnormal; Notable for the following components:   Ammonia <9 (*)    All other components within normal limits  LIPASE, BLOOD  TROPONIN I  TACROLIMUS LEVEL  MYCOPHENOLIC ACID (CELLCEPT)  SIROLIMUS LEVEL    Lab work reviewed by me with normal renal function, negative ammonia, and no evidence of urinary tract infection __________________________________________  EKG   ____________________________________________  RADIOLOGY  CT stone reviewed by me with no acute disease CT lumbar spine reviewed by me which could be consistent with right-sided lumbar nerve impingement ____________________________________________   PROCEDURES  Procedure(s) performed: no  Procedures  Critical Care performed: no  ____________________________________________   INITIAL IMPRESSION / ASSESSMENT AND PLAN / ED COURSE  Pertinent labs & imaging results that were  available during my care of the patient were reviewed by me and considered in my medical decision making (see chart for details).   As part of my medical decision making, I reviewed the following data within the [place] History obtained from family if available, nursing notes, old chart and ekg, as well as notes from prior ED visits.  History is challenging to obtain as the patient is a very poor historian.  She does arrive with a significant amount of right flank pain so IV fentanyl 50 mcg given for pain and broad work-up including urinalysis and antirejection drug levels are pending.  I think she will require a CT stone and lumbar spine.  The patient's pain is improved after IV fentanyl.  CT scan is concerning for possible nerve impingement on the right although she is neuro intact.  I will prescribe her a short course of hydrocodone and Lidoderm patch and have her follow-up with orthopedic surgery for management of her back pain.  She is discharged home in improved condition.     ____________________________________________   FINAL CLINICAL IMPRESSION(S) / ED DIAGNOSES  Final diagnoses:  Pain  Acute right-sided low back pain without sciatica      NEW MEDICATIONS STARTED DURING THIS VISIT:  Discharge Medication List as of 10/07/2017  2:10 PM    START taking these medications   Details  HYDROcodone-acetaminophen (NORCO) 5-325 MG tablet Take 1 tablet by mouth every 6 (six) hours as needed for up to 7 doses for severe pain., Starting Sat 10/07/2017, Print    lidocaine (LIDODERM) 5 % Place 1 patch onto the skin every 12 (twelve) hours. Remove & Discard patch within 12 hours or as directed by MD, Starting Sat 10/07/2017, Until Sun 10/07/2018, Print         Note:  This document was prepared using Dragon voice recognition software and may include unintentional dictation errors.     Darel Hong, MD 10/08/17 1745

## 2017-10-07 NOTE — ED Notes (Signed)
Patient refers to her daughter for answers to questions about doctors and her history. Daughter is not at bedside at this time.

## 2017-10-07 NOTE — ED Triage Notes (Signed)
Per EMS report, Patient c/o right-side lower back pain that began yesterday. Patient has a history of kidney transplant. Patient c/o increased pain when getting in and out of bed. Patient denies dysuria. Patient is calm and cooperative, but appears confused.

## 2017-10-07 NOTE — ED Notes (Signed)
E signature pad not working 

## 2017-10-10 LAB — MYCOPHENOLIC ACID (CELLCEPT)
MPA Glucuronide: 41 ug/mL (ref 15–125)
MPA: 5.3 ug/mL — AB (ref 1.0–3.5)

## 2017-10-13 LAB — SIROLIMUS LEVEL: SIROLIMUS (RAPAMYCIN): 14.6 ng/mL (ref 3.0–20.0)

## 2017-10-13 LAB — TACROLIMUS LEVEL: TACROLIMUS (FK506) - LABCORP: NOT DETECTED ng/mL (ref 2.0–20.0)

## 2017-11-21 ENCOUNTER — Inpatient Hospital Stay
Admission: EM | Admit: 2017-11-21 | Discharge: 2017-11-23 | DRG: 872 | Disposition: A | Discharge status: Home Health | Payer: Medicare Other | Attending: Internal Medicine | Admitting: Internal Medicine

## 2017-11-21 ENCOUNTER — Emergency Department: Payer: Medicare Other

## 2017-11-21 ENCOUNTER — Other Ambulatory Visit: Payer: Self-pay

## 2017-11-21 DIAGNOSIS — Z940 Kidney transplant status: Secondary | ICD-10-CM

## 2017-11-21 DIAGNOSIS — N390 Urinary tract infection, site not specified: Secondary | ICD-10-CM | POA: Diagnosis not present

## 2017-11-21 DIAGNOSIS — Z992 Dependence on renal dialysis: Secondary | ICD-10-CM

## 2017-11-21 DIAGNOSIS — K219 Gastro-esophageal reflux disease without esophagitis: Secondary | ICD-10-CM | POA: Diagnosis present

## 2017-11-21 DIAGNOSIS — Z905 Acquired absence of kidney: Secondary | ICD-10-CM | POA: Diagnosis not present

## 2017-11-21 DIAGNOSIS — A4189 Other specified sepsis: Secondary | ICD-10-CM | POA: Diagnosis not present

## 2017-11-21 DIAGNOSIS — D631 Anemia in chronic kidney disease: Secondary | ICD-10-CM | POA: Diagnosis present

## 2017-11-21 DIAGNOSIS — N179 Acute kidney failure, unspecified: Secondary | ICD-10-CM | POA: Diagnosis present

## 2017-11-21 DIAGNOSIS — I129 Hypertensive chronic kidney disease with stage 1 through stage 4 chronic kidney disease, or unspecified chronic kidney disease: Secondary | ICD-10-CM | POA: Diagnosis present

## 2017-11-21 DIAGNOSIS — Z888 Allergy status to other drugs, medicaments and biological substances status: Secondary | ICD-10-CM | POA: Diagnosis not present

## 2017-11-21 DIAGNOSIS — F0390 Unspecified dementia without behavioral disturbance: Secondary | ICD-10-CM | POA: Diagnosis present

## 2017-11-21 DIAGNOSIS — R6520 Severe sepsis without septic shock: Secondary | ICD-10-CM | POA: Diagnosis present

## 2017-11-21 DIAGNOSIS — B9689 Other specified bacterial agents as the cause of diseases classified elsewhere: Secondary | ICD-10-CM | POA: Diagnosis present

## 2017-11-21 DIAGNOSIS — Z23 Encounter for immunization: Secondary | ICD-10-CM

## 2017-11-21 DIAGNOSIS — R509 Fever, unspecified: Secondary | ICD-10-CM

## 2017-11-21 DIAGNOSIS — Z85528 Personal history of other malignant neoplasm of kidney: Secondary | ICD-10-CM

## 2017-11-21 DIAGNOSIS — A419 Sepsis, unspecified organism: Secondary | ICD-10-CM | POA: Diagnosis present

## 2017-11-21 DIAGNOSIS — B961 Klebsiella pneumoniae [K. pneumoniae] as the cause of diseases classified elsewhere: Secondary | ICD-10-CM | POA: Diagnosis present

## 2017-11-21 DIAGNOSIS — N183 Chronic kidney disease, stage 3 (moderate): Secondary | ICD-10-CM | POA: Diagnosis present

## 2017-11-21 LAB — COMPREHENSIVE METABOLIC PANEL
ALK PHOS: 58 U/L (ref 38–126)
ALT: 15 U/L (ref 0–44)
AST: 23 U/L (ref 15–41)
Albumin: 3.6 g/dL (ref 3.5–5.0)
Anion gap: 9 (ref 5–15)
BUN: 20 mg/dL (ref 8–23)
CALCIUM: 8 mg/dL — AB (ref 8.9–10.3)
CO2: 25 mmol/L (ref 22–32)
CREATININE: 1.64 mg/dL — AB (ref 0.44–1.00)
Chloride: 103 mmol/L (ref 98–111)
GFR calc non Af Amer: 31 mL/min — ABNORMAL LOW (ref >60)
GFR, EST AFRICAN AMERICAN: 36 mL/min — AB (ref >60)
Glucose, Bld: 109 mg/dL — ABNORMAL HIGH (ref 70–99)
Potassium: 3.3 mmol/L — ABNORMAL LOW (ref 3.5–5.1)
SODIUM: 137 mmol/L (ref 135–145)
Total Bilirubin: 0.8 mg/dL (ref 0.3–1.2)
Total Protein: 7.3 g/dL (ref 6.5–8.1)

## 2017-11-21 LAB — CBC WITH DIFFERENTIAL/PLATELET
BASOS ABS: 0.1 10*3/uL (ref 0–0.1)
Basophils Relative: 1 %
Eosinophils Absolute: 0 10*3/uL (ref 0–0.7)
Eosinophils Relative: 0 %
HCT: 32.3 % — ABNORMAL LOW (ref 35.0–47.0)
HEMOGLOBIN: 10.3 g/dL — AB (ref 12.0–16.0)
LYMPHS ABS: 0.8 10*3/uL — AB (ref 1.0–3.6)
LYMPHS PCT: 10 %
MCH: 24.4 pg — AB (ref 26.0–34.0)
MCHC: 31.9 g/dL — ABNORMAL LOW (ref 32.0–36.0)
MCV: 76.5 fL — AB (ref 80.0–100.0)
Monocytes Absolute: 1 10*3/uL — ABNORMAL HIGH (ref 0.2–0.9)
Monocytes Relative: 13 %
NEUTROS PCT: 76 %
Neutro Abs: 5.7 10*3/uL (ref 1.4–6.5)
Platelets: 164 10*3/uL (ref 150–440)
RBC: 4.23 MIL/uL (ref 3.80–5.20)
RDW: 15.3 % — ABNORMAL HIGH (ref 11.5–14.5)
WBC: 7.6 10*3/uL (ref 3.6–11.0)

## 2017-11-21 LAB — URINALYSIS, COMPLETE (UACMP) WITH MICROSCOPIC
BILIRUBIN URINE: NEGATIVE
Glucose, UA: NEGATIVE mg/dL
KETONES UR: 20 mg/dL — AB
Nitrite: NEGATIVE
PH: 6 (ref 5.0–8.0)
Protein, ur: 100 mg/dL — AB
Specific Gravity, Urine: 1.012 (ref 1.005–1.030)

## 2017-11-21 LAB — LACTIC ACID, PLASMA
LACTIC ACID, VENOUS: 0.7 mmol/L (ref 0.5–1.9)
Lactic Acid, Venous: 0.9 mmol/L (ref 0.5–1.9)

## 2017-11-21 LAB — MAGNESIUM: Magnesium: 1.9 mg/dL (ref 1.7–2.4)

## 2017-11-21 MED ORDER — FOLIC ACID 1 MG PO TABS
1.0000 mg | ORAL_TABLET | Freq: Two times a day (BID) | ORAL | Status: DC
  Administered 2017-11-21 – 2017-11-23 (×5): 1 mg via ORAL
  Filled 2017-11-21 (×5): qty 1

## 2017-11-21 MED ORDER — CITALOPRAM HYDROBROMIDE 20 MG PO TABS
20.0000 mg | ORAL_TABLET | Freq: Every day | ORAL | Status: DC
  Administered 2017-11-21 – 2017-11-23 (×3): 20 mg via ORAL
  Filled 2017-11-21 (×3): qty 1

## 2017-11-21 MED ORDER — POTASSIUM CHLORIDE IN NACL 20-0.9 MEQ/L-% IV SOLN
INTRAVENOUS | Status: DC
  Administered 2017-11-21 – 2017-11-23 (×3): via INTRAVENOUS
  Filled 2017-11-21 (×4): qty 1000

## 2017-11-21 MED ORDER — CEPHALEXIN 500 MG PO CAPS: 500.0000 mg | ORAL_CAPSULE | Freq: Three times a day (TID) | ORAL | 0 refills | Status: DC

## 2017-11-21 MED ORDER — ONDANSETRON HCL 4 MG/2ML IJ SOLN: 4.0000 mg | Freq: Four times a day (QID) | INTRAMUSCULAR | Status: DC | PRN

## 2017-11-21 MED ORDER — ACETAMINOPHEN 325 MG PO TABS
650.0000 mg | ORAL_TABLET | Freq: Four times a day (QID) | ORAL | Status: DC | PRN
  Administered 2017-11-21 – 2017-11-23 (×2): 650 mg via ORAL
  Filled 2017-11-21 (×2): qty 2

## 2017-11-21 MED ORDER — ACETAMINOPHEN 650 MG RE SUPP: 650.0000 mg | Freq: Four times a day (QID) | RECTAL | Status: DC | PRN

## 2017-11-21 MED ORDER — ONDANSETRON HCL 4 MG PO TABS: 4.0000 mg | ORAL_TABLET | Freq: Four times a day (QID) | ORAL | Status: DC | PRN

## 2017-11-21 MED ORDER — INFLUENZA VAC SPLIT HIGH-DOSE 0.5 ML IM SUSY
0.5000 mL | PREFILLED_SYRINGE | INTRAMUSCULAR | Status: AC
  Administered 2017-11-22: 0.5 mL via INTRAMUSCULAR
  Filled 2017-11-21: qty 0.5

## 2017-11-21 MED ORDER — SODIUM CHLORIDE 0.9 % IV SOLN
1.0000 g | Freq: Once | INTRAVENOUS | Status: AC
  Administered 2017-11-21: 1 g via INTRAVENOUS
  Filled 2017-11-21: qty 10

## 2017-11-21 MED ORDER — HEPARIN SODIUM (PORCINE) 5000 UNIT/ML IJ SOLN
5000.0000 [IU] | Freq: Three times a day (TID) | INTRAMUSCULAR | Status: DC
  Administered 2017-11-21 – 2017-11-23 (×5): 5000 [IU] via SUBCUTANEOUS
  Filled 2017-11-21 (×5): qty 1

## 2017-11-21 MED ORDER — SIROLIMUS 1 MG PO TABS
3.0000 mg | ORAL_TABLET | Freq: Every day | ORAL | Status: DC
  Administered 2017-11-21 – 2017-11-23 (×2): 3 mg via ORAL
  Filled 2017-11-21 (×3): qty 3

## 2017-11-21 MED ORDER — PNEUMOCOCCAL VAC POLYVALENT 25 MCG/0.5ML IJ INJ
0.5000 mL | INJECTION | INTRAMUSCULAR | Status: AC
  Administered 2017-11-22: 0.5 mL via INTRAMUSCULAR
  Filled 2017-11-21: qty 0.5

## 2017-11-21 MED ORDER — ATORVASTATIN CALCIUM 20 MG PO TABS
40.0000 mg | ORAL_TABLET | Freq: Every day | ORAL | Status: DC
  Administered 2017-11-21 – 2017-11-22 (×2): 40 mg via ORAL
  Filled 2017-11-21 (×2): qty 2

## 2017-11-21 MED ORDER — POTASSIUM CHLORIDE CRYS ER 20 MEQ PO TBCR
20.0000 meq | EXTENDED_RELEASE_TABLET | Freq: Two times a day (BID) | ORAL | Status: DC
  Administered 2017-11-21 – 2017-11-23 (×5): 20 meq via ORAL
  Filled 2017-11-21 (×5): qty 1

## 2017-11-21 MED ORDER — SODIUM CHLORIDE 0.9 % IV SOLN
1.0000 g | INTRAVENOUS | Status: DC
  Administered 2017-11-22 – 2017-11-23 (×2): 1 g via INTRAVENOUS
  Filled 2017-11-21: qty 1
  Filled 2017-11-21: qty 10

## 2017-11-21 MED ORDER — POLYETHYLENE GLYCOL 3350 17 G PO PACK: 17.0000 g | PACK | Freq: Every day | ORAL | Status: DC | PRN

## 2017-11-21 MED ORDER — DONEPEZIL HCL 5 MG PO TABS
5.0000 mg | ORAL_TABLET | Freq: Every evening | ORAL | Status: DC
  Administered 2017-11-21 – 2017-11-22 (×2): 5 mg via ORAL
  Filled 2017-11-21 (×3): qty 1

## 2017-11-21 MED ORDER — CALCITRIOL 0.25 MCG PO CAPS
0.2500 ug | ORAL_CAPSULE | Freq: Every day | ORAL | Status: DC
  Administered 2017-11-21 – 2017-11-23 (×3): 0.25 ug via ORAL
  Filled 2017-11-21 (×3): qty 1

## 2017-11-21 MED ORDER — MYCOPHENOLATE SODIUM 180 MG PO TBEC
180.0000 mg | DELAYED_RELEASE_TABLET | Freq: Two times a day (BID) | ORAL | Status: DC
  Administered 2017-11-21 – 2017-11-23 (×5): 180 mg via ORAL
  Filled 2017-11-21 (×7): qty 1

## 2017-11-21 MED ORDER — METOPROLOL SUCCINATE ER 25 MG PO TB24
25.0000 mg | ORAL_TABLET | Freq: Every day | ORAL | Status: DC
  Administered 2017-11-22 – 2017-11-23 (×2): 25 mg via ORAL
  Filled 2017-11-21 (×3): qty 1

## 2017-11-21 MED ORDER — ACETAMINOPHEN 500 MG PO TABS
1000.0000 mg | ORAL_TABLET | Freq: Once | ORAL | Status: AC
  Administered 2017-11-21: 1000 mg via ORAL
  Filled 2017-11-21: qty 2

## 2017-11-21 NOTE — ED Notes (Signed)
Pts husband to pick pt up at 0800.

## 2017-11-21 NOTE — ED Notes (Signed)
Pts family (Daughter) not reachable. Will call back for discharge.

## 2017-11-21 NOTE — H&P (Addendum)
[facility] NAME: [name]    MR#:  [id number]  DATE OF BIRTH:  Jun 30, 1950  DATE OF ADMISSION:  11/21/2017  PRIMARY CARE PHYSICIAN: [facility] [place]   REQUESTING/REFERRING PHYSICIAN: Dr. Beather Arbour  CHIEF COMPLAINT:   Fever and urinated as well as had stool all over her. Patient has baseline dementia lives with daughter at home. No family in the ER. Not able to give me much history. HISTORY OF PRESENT ILLNESS:  [name]  is a 66 y.o. female with a known history of dementia, hypertension, CKD stage II, chronic anemia comes to the emergency room brought in by EMS after daughter found patient covered in urine and feces. She had a fever low-grade at home however here temperature was 102.3. Was noted patient have UTI. Patient denies any complaints other than dysuria. She denies any chest pain shortness of breath. She is not the best historian. No family in the ER patient received IV Rocephin receiving IV fluids and being admitted for sepsis due to UTI.  PAST MEDICAL HISTORY:   Past Medical History:  Diagnosis Date  . Anemia   . Cancer ([place])   . Dementia   . GERD (gastroesophageal reflux disease)   . Hypertension   . Renal disorder     PAST SURGICAL HISTOIRY:   Past Surgical History:  Procedure Laterality Date  . ABDOMINAL HYSTERECTOMY    . AV FISTULA PLACEMENT    . BREAST BIOPSY     Patient is unsure but she thinks she remember a Bx  . KIDNEY TRANSPLANT    . NEPHRECTOMY TRANSPLANTED ORGAN      SOCIAL HISTORY:   Social History   Tobacco Use  . Smoking status: Former Research scientist (life sciences)  . Smokeless tobacco: Never Used  Substance Use Topics  . Alcohol use: No    FAMILY HISTORY:   Family History  Problem Relation Age of Onset  . Stroke Mother   . Alcohol abuse Father   . Breast cancer Neg Hx     DRUG ALLERGIES:   Allergies  Allergen Reactions  . Ace Inhibitors Cough  . Lisinopril Cough     REVIEW OF SYSTEMS:  Review of Systems  Unable to perform ROS: Dementia     MEDICATIONS AT HOME:   Prior to Admission medications   Medication Sig Start Date End Date Taking? Authorizing Provider  atorvastatin (LIPITOR) 40 MG tablet Take 40 mg by mouth at bedtime.   Yes [provider]  calcitRIOL (ROCALTROL) 0.25 MCG capsule Take 0.25 mcg by mouth daily.   Yes [provider]  calcium elemental as carbonate (TUMS ULTRA 1000) 400 MG chewable tablet Chew 1,000 mg by mouth daily as needed for heartburn.   Yes [provider]  citalopram (CELEXA) 20 MG tablet Take 20 mg by mouth daily.   Yes [provider]  donepezil (ARICEPT) 5 MG tablet Take 5 mg by mouth every evening.  04/05/16  Yes [provider]  folic acid (FOLVITE) 1 MG tablet Take 1 mg by mouth 2 (two) times daily.    Yes [provider]  furosemide (LASIX) 20 MG tablet Take 20 mg by mouth 2 (two) times daily.   Yes [provider]  metoprolol succinate (TOPROL-XL) 25 MG 24 hr tablet Take 1 tablet (25 mg total) by mouth daily. 07/02/16  Yes Gladstone Lighter, MD  mycophenolate (MYFORTIC) 180 MG EC tablet Take 180 mg by mouth 2 (two) times daily.  Yes [provider]  potassium chloride (K-DUR,KLOR-CON) 10 MEQ tablet Take 20 mEq by mouth 2 (two) times daily.   Yes [provider]  sirolimus (RAPAMUNE) 1 MG tablet Take 3 mg by mouth daily.    Yes [provider]  cephALEXin (KEFLEX) 500 MG capsule Take 1 capsule (500 mg total) by mouth 3 (three) times daily. 11/21/17   Paulette Blanch, MD  HYDROcodone-acetaminophen (NORCO) 5-325 MG tablet Take 1 tablet by mouth every 6 (six) hours as needed for up to 7 doses for severe pain. Patient not taking: Reported on 11/21/2017 10/07/17   Darel Hong, MD  lidocaine (LIDODERM) 5 % Place 1 patch onto the skin every 12 (twelve) hours. Remove & Discard patch within 12 hours or as directed by MD Patient not  taking: Reported on 11/21/2017 10/07/17 10/07/18  Darel Hong, MD  oxyCODONE (OXY IR/ROXICODONE) 5 MG immediate release tablet Take 1 tablet (5 mg total) by mouth every 4 (four) hours as needed for moderate pain or severe pain. Patient not taking: Reported on 11/21/2017 06/03/17   Henreitta Leber, MD  predniSONE (DELTASONE) 10 MG tablet Label  & dispense according to the schedule below. 5 Pills PO for 1 day then, 4 Pills PO for 1 day, 3 Pills PO for 1 day, 2 Pills PO for 1 day, 1 Pill PO for 1 days then STOP. Patient not taking: Reported on 11/21/2017 06/03/17   Henreitta Leber, MD      VITAL SIGNS:  Blood pressure 103/61, pulse 85, temperature 99.2 F (37.3 C), resp. rate 17, weight 90.5 kg, SpO2 95 %.  PHYSICAL EXAMINATION:  GENERAL:  67 y.o.-year-old patient lying in the bed with no acute distress. Obese EYES: Pupils equal, round, reactive to light and accommodation. No scleral icterus. Extraocular muscles intact.  HEENT: Head atraumatic, normocephalic. Oropharynx and nasopharynx clear.  NECK:  Supple, no jugular venous distention. No thyroid enlargement, no tenderness.  LUNGS: Normal breath sounds bilaterally, no wheezing, rales,rhonchi or crepitation. No use of accessory muscles of respiration.  CARDIOVASCULAR: S1, S2 normal. No murmurs, rubs, or gallops.  ABDOMEN: Soft, nontender, nondistended. Bowel sounds present. No organomegaly or mass.  EXTREMITIES: No pedal edema, cyanosis, or clubbing.  NEUROLOGIC: Cranial nerves II through XII are intact. Muscle strength 5/5 in all extremities. Sensation intact. Gait not checked.  PSYCHIATRIC: The patient is alert and awake SKIN: No obvious rash, lesion, or ulcer.   LABORATORY PANEL:   CBC Recent Labs  Lab 11/21/17 0205  WBC 7.6  HGB 10.3*  HCT 32.3*  PLT 164   ------------------------------------------------------------------------------------------------------------------  Chemistries  Recent Labs  Lab 11/21/17 0205  NA 137   K 3.3*  CL 103  CO2 25  GLUCOSE 109*  BUN 20  CREATININE 1.64*  CALCIUM 8.0*  AST 23  ALT 15  ALKPHOS 58  BILITOT 0.8   ------------------------------------------------------------------------------------------------------------------  Cardiac Enzymes No results for input(s): TROPONINI in the last 168 hours. ------------------------------------------------------------------------------------------------------------------  RADIOLOGY:  Dg Chest Port 1 View  Result Date: 11/21/2017 CLINICAL DATA:  Weakness. Patient was found on couch. History of dementia. Possible urinary tract infection. EXAM: PORTABLE CHEST 1 VIEW COMPARISON:  06/29/2016 FINDINGS: Cardiac enlargement without vascular congestion or edema. No consolidation or airspace disease in the lungs. No blunting of costophrenic angles. No pneumothorax. Mediastinal contours appear intact. Calcification of the aorta. Degenerative changes in the spine and shoulders. IMPRESSION: Cardiac enlargement. No evidence of active pulmonary disease. Electronically Signed   By: Oren Beckmann.D.  On: 11/21/2017 02:21    EKG:    IMPRESSION AND PLAN:   [name]  is a 67 y.o. female with a known history of dementia, hypertension, CKD stage II, chronic anemia comes to the emergency room brought in by EMS after daughter found patient covered in urine and feces. She had a fever low-grade at home however here temperature was 102.3. Was noted patient have UTI.  1. sepsis due to UTI -came in with high-grade fever of 102, mild tachycardia, UA positive for UTI -IV Rocephin -follow-up urine culture -PRN Tylenol for fever  2 acute on chronic renal failure stage II/III. Patient has transplant kidney. -Nephrology consultation with dr Juleen China -she is not on hemodialysis at present. This was confirmed with patient's husband Mr. Marland Kitchen she used to be on dialysis before her kidney transplant -IV fluids at 50 cc /hr -hold nephrotoxins -she  has history of renal transplant. Continue her immunosuppressants  3. Dementia continue Aricept  4. Hyperlipidemia continue atorvastatin  pt is ambulatory at home.  No family member in the ER.    All the records are reviewed and case discussed with ED provider. Management plans discussed with the patient, family and they are in agreement.  CODE STATUS: full (for now till we discussed with family)  TOTAL TIME TAKING CARE OF THIS PATIENT: *45* minutes.    Fritzi Mandes M.D on 11/21/2017 at 8:50 AM  Between 7am to 6pm - Pager - [phone]  After 6pm go to [email or web address] - password [email or web address] [facility]  [facility]  Office  [phone]  CC: Primary care physician; [facility]

## 2017-11-21 NOTE — Progress Notes (Signed)
Pt.'s home medication, sirolimus was given to RN to store in pharmacy so pt is able to receive medication daily. Pt received her daily dose this evening. Pt and pt.'s family is aware that medication is locked up in the pharmacy.  [name] [employer]

## 2017-11-21 NOTE — ED Provider Notes (Signed)
[facility] Emergency Department Provider Note   ____________________________________________   First MD Initiated Contact with Patient 11/21/17 0149     (approximate)  I have reviewed the triage vital signs and the nursing notes.   HISTORY  Chief Complaint "UTI"  Level V caveat: history limited by dementia  HPI Kienna Moncada is a 67 y.o. female brought to the ED from home via EMS with a chief complaint of possible UTI.  Patient lives at home with her daughter who states patient has been on the couch for approximately 8 hours.  EMS reports patient was sitting alone on the couch covered in urine and feces.  History of UTIs.  Also has a history of renal transplant, end-stage renal disease on hemodialysis Tuesday/Thursday/Saturday.  Rest of history unobtainable given patient's dementia.  Patient voices no complaints.   Past Medical History:  Diagnosis Date  . Anemia   . Cancer ([place])   . Dementia   . GERD (gastroesophageal reflux disease)   . Hypertension   . Renal disorder     Patient Active Problem List   Diagnosis Date Noted  . Hypokalemia 06/01/2017  . Sepsis ([place]) 06/29/2016  . UTI (urinary tract infection) 06/29/2016  . GERD (gastroesophageal reflux disease) 06/29/2016  . Dementia ([place]) 06/29/2016  . 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 Essential hypertension 07/06/2014  . H/O kidney transplant 07/06/2014    Past Surgical History:  Procedure Laterality Date  . ABDOMINAL HYSTERECTOMY    . AV FISTULA PLACEMENT    . BREAST BIOPSY     Patient is unsure but she thinks she remember a Bx  . KIDNEY TRANSPLANT    . NEPHRECTOMY TRANSPLANTED ORGAN      Prior to Admission medications   Medication Sig Start Date End Date Taking? Authorizing Provider  atorvastatin (LIPITOR) 40 MG tablet Take 40 mg by mouth at bedtime.    [provider]  calcitRIOL (ROCALTROL) 0.25 MCG capsule Take 0.25 mcg by mouth daily.    [provider]  calcium elemental as carbonate (TUMS ULTRA 1000) 400 MG chewable tablet Chew 1,000 mg by mouth daily as needed for heartburn.    [provider]  cephALEXin (KEFLEX) 500 MG capsule Take 1 capsule (500 mg total) by mouth 3 (three) times daily. 11/21/17   Paulette Blanch, MD  citalopram (CELEXA) 20 MG tablet Take 20 mg by mouth daily.    [provider]  donepezil (ARICEPT) 5 MG tablet Take 5 mg by mouth every evening.  04/05/16   [provider]  folic acid (FOLVITE) 1 MG tablet Take 1 mg by mouth 2 (two) times daily.     [provider]  furosemide (LASIX) 20 MG tablet Take 20 mg by mouth 2 (two) times daily.    [provider]  HYDROcodone-acetaminophen (NORCO) 5-325 MG tablet Take 1 tablet by mouth every 6 (six) hours as needed for up to 7 doses for severe pain. 10/07/17   Darel Hong, MD  lidocaine (LIDODERM) 5 % Place 1 patch onto the skin every 12 (twelve) hours. Remove & Discard patch within 12 hours or as directed by MD 10/07/17 10/07/18  Darel Hong, MD  losartan (COZAAR) 25 MG tablet Take 25 mg by mouth daily.    [provider]  metoprolol succinate (TOPROL-XL) 25 MG 24 hr tablet Take 1 tablet (25 mg total) by mouth daily. 07/02/16   Gladstone Lighter, MD  mycophenolate (MYFORTIC) 180 MG EC tablet Take 180 mg by  mouth 2 (two) times daily.    [provider]  oxyCODONE (OXY IR/ROXICODONE) 5 MG immediate release tablet Take 1 tablet (5 mg total) by mouth every 4 (four) hours as needed for moderate pain or severe pain. 06/03/17   Henreitta Leber, MD  potassium chloride (K-DUR,KLOR-CON) 10 MEQ tablet Take 20 mEq by mouth 2 (two) times daily.    [provider]  predniSONE (DELTASONE) 10 MG tablet Label  & dispense according to the schedule below. 5 Pills PO for 1 day then, 4 Pills PO for 1 day, 3 Pills PO for 1 day, 2 Pills PO for 1 day, 1 Pill PO for 1 days then STOP. Patient not taking: Reported on 11/21/2017 06/03/17    Henreitta Leber, MD  sirolimus (RAPAMUNE) 1 MG tablet Take 3 mg by mouth daily.     [provider]    Allergies Ace inhibitors and Lisinopril  Family History  Problem Relation Age of Onset  . Stroke Mother   . Alcohol abuse Father   . Breast cancer Neg Hx     Social History Social History   Tobacco Use  . Smoking status: Former Research scientist (life sciences)  . Smokeless tobacco: Never Used  Substance Use Topics  . Alcohol use: No  . Drug use: No    Review of Systems  Constitutional: No fever/chills Eyes: No visual changes. ENT: No sore throat. Cardiovascular: Denies chest pain. Respiratory: Denies shortness of breath. Gastrointestinal: No abdominal pain.  No nausea, no vomiting.  No diarrhea.  No constipation. Genitourinary: Positive for "UTI".  Negative for dysuria. Musculoskeletal: Negative for back pain. Skin: Negative for rash. Neurological: Negative for headaches, focal weakness or numbness.   ____________________________________________   PHYSICAL EXAM:  VITAL SIGNS: ED Triage Vitals  Enc Vitals Group     BP      Pulse      Resp      Temp      Temp src      SpO2      Weight      Height      Head Circumference      Peak Flow      Pain Score      Pain Loc      Pain Edu?      Excl. in [place]?     Constitutional: Alert and oriented.  Elderly appearing and in no acute distress. Eyes: Conjunctivae are normal. PERRL. EOMI. Head: Atraumatic. Nose: No congestion/rhinnorhea. Mouth/Throat: Mucous membranes are moist.  Oropharynx non-erythematous. Neck: No stridor.   Cardiovascular: Normal rate, regular rhythm. Grossly normal heart sounds.  Good peripheral circulation. Respiratory: Normal respiratory effort.  No retractions. Lungs CTAB. Gastrointestinal: Soft and nontender to light or deep palpation. No distention. No abdominal bruits. No CVA tenderness. Musculoskeletal: No lower extremity tenderness nor edema.  No joint effusions. Neurologic: Alert and oriented to  person and place.  Normal speech and language. No gross focal neurologic deficits are appreciated. MAEx4. Skin:  Skin is warm, dry and intact. No rash noted. Psychiatric: Mood and affect are normal. Speech and behavior are normal.  ____________________________________________   LABS (all labs ordered are listed, but only abnormal results are displayed)  Labs Reviewed  CBC WITH DIFFERENTIAL/PLATELET - Abnormal; Notable for the following components:      Result Value   Hemoglobin 10.3 (*)    HCT 32.3 (*)    MCV 76.5 (*)    MCH 24.4 (*)    MCHC 31.9 (*)  RDW 15.3 (*)    Lymphs Abs 0.8 (*)    Monocytes Absolute 1.0 (*)    All other components within normal limits  COMPREHENSIVE METABOLIC PANEL - Abnormal; Notable for the following components:   Potassium 3.3 (*)    Glucose, Bld 109 (*)    Creatinine, Ser 1.64 (*)    Calcium 8.0 (*)    GFR calc non Af Amer 31 (*)    GFR calc Af Amer 36 (*)    All other components within normal limits  URINALYSIS, COMPLETE (UACMP) WITH MICROSCOPIC - Abnormal; Notable for the following components:   Color, Urine YELLOW (*)    APPearance TURBID (*)    Hgb urine dipstick MODERATE (*)    Ketones, ur 20 (*)    Protein, ur 100 (*)    Leukocytes, UA LARGE (*)    WBC, UA >50 (*)    Bacteria, UA RARE (*)    All other components within normal limits  CULTURE, BLOOD (ROUTINE X 2)  CULTURE, BLOOD (ROUTINE X 2)  URINE CULTURE  LACTIC ACID, PLASMA  LACTIC ACID, PLASMA   ____________________________________________  EKG  ED ECG REPORT I, SUNG,JADE J, the attending physician, personally viewed and interpreted this ECG.   Date: 11/21/2017  EKG Time: 0200  Rate: 95  Rhythm: normal EKG, normal sinus rhythm  Axis: Normal  Intervals:none  ST&T Change: Nonspecific  ____________________________________________  RADIOLOGY  ED MD interpretation: No acute cardiopulmonary process  Official radiology report(s): Dg Chest Port 1 View  Result Date:  11/21/2017 CLINICAL DATA:  Weakness. Patient was found on couch. History of dementia. Possible urinary tract infection. EXAM: PORTABLE CHEST 1 VIEW COMPARISON:  06/29/2016 FINDINGS: Cardiac enlargement without vascular congestion or edema. No consolidation or airspace disease in the lungs. No blunting of costophrenic angles. No pneumothorax. Mediastinal contours appear intact. Calcification of the aorta. Degenerative changes in the spine and shoulders. IMPRESSION: Cardiac enlargement. No evidence of active pulmonary disease. Electronically Signed   By: Lucienne Capers M.D.   On: 11/21/2017 02:21    ____________________________________________   PROCEDURES  Procedure(s) performed: None  Procedures  Critical Care performed: No  ____________________________________________   INITIAL IMPRESSION / ASSESSMENT AND PLAN / ED COURSE  As part of my medical decision making, I reviewed the following data within the [facility] notes reviewed and incorporated, Labs reviewed, EKG interpreted, Old chart reviewed, Radiograph reviewed and Notes from prior ED visits   67 year old female with dementia, status post renal transplant, on hemodialysis who presents with possible UTI.  EMS reports patient was left on her couch all day covered in urine and feces.  Differential diagnosis includes but is not limited to infectious, metabolic, electrolyte abnormalities, etc.  Will obtain screening lab work, blood cultures, lactic, urinalysis and reassess.   Clinical Course as of Nov 22 719  Tue Nov 21, 2017  0327 Updated patient on laboratory and urinalysis results.  Normal WBC and lactic.  Small bump in creatinine; large leukocytes on urinalysis.  Will administer 1 g IV Rocephin.  Urine culture added.  Will discharge home on Keflex.  Patient has dialysis this morning.   [JS]  0457 IV Rocephin completed.  Patient resting no acute distress.  Strict return precautions given.  Patient  verbalizes understanding agrees with plan of care.   [JS]  0708 Patient felt hot to the touch.  Recheck temperature which is now 102.3 F.  Tylenol ordered.  Discussed with hospitalist services to evaluate patient in the emergency  department for admission in this elderly dementia patient status post renal transplant on immunosuppressant and hemodialysis.   [JS]    Clinical Course User Index [JS] Paulette Blanch, MD     ____________________________________________   FINAL CLINICAL IMPRESSION(S) / ED DIAGNOSES  Final diagnoses:  Lower urinary tract infectious disease  Fever, unspecified fever cause  Renal transplant recipient  Hemodialysis patient [facility])     ED Discharge Orders         Ordered    cephALEXin (KEFLEX) 500 MG capsule  3 times daily     11/21/17 0329           Note:  This document was prepared using Dragon voice recognition software and may include unintentional dictation errors.    Paulette Blanch, MD 11/21/17 [phone]

## 2017-11-21 NOTE — ED Notes (Signed)
Admitting MD at bedside at this time.

## 2017-11-21 NOTE — ED Triage Notes (Signed)
Pt brought in from home by [facility] for possible UTI. Pt has history of the same, lives with daughter. Pt was found on couch, has been there for approx 8 hrs. Pt covered in urine and stool. Daughter is caretaker, pt has dementia and oriented to self and place only. Pt states she has no complaints unsure of why she is here. 18g to left forearm per EMS, fsbs 134. Pt is a dialysis pt with fistula to left forearm.

## 2017-11-21 NOTE — Progress Notes (Signed)
RN talked to daughter, [name], that pharmacy does not have one of her kidney transplant medications- sirolimus. Pt.'s daughter states that she will bring this medication to the floor once she gets off of work around 1600. RN also completed admission questions with daughter.   Jada Kuhnert [employer]

## 2017-11-21 NOTE — ED Notes (Signed)
Pt repositioned in bed for comfort. PureWick applied by this RN and Konrad Dolores, EDT-P. Pt also placed in clean brief as soiled with stool. Will continue to monitor for further patient needs.

## 2017-11-21 NOTE — Progress Notes (Signed)
[facility]  ROUNDING NOTE   Subjective:   [name] admitted to [facility] on 11/21/2017 for Lower urinary tract infectious disease [N39.0] Hemodialysis patient ([place]) [Z99.2] Fever, unspecified fever cause [R50.9] Renal transplant recipient [Z94.0]   Objective:  Vital signs in last 24 hours:  Temp:  [98.9 F (37.2 C)-102.3 F (39.1 C)] 98.9 F (37.2 C) (10/08 1409) Pulse Rate:  [81-91] 87 (10/08 1409) Resp:  [16-21] 18 (10/08 1409) BP: (103-155)/(60-89) 112/60 (10/08 1409) SpO2:  [95 %-100 %] 96 % (10/08 1409) Weight:  [90.5 kg] 90.5 kg (10/08 0156)  Weight change:  Filed Weights   11/21/17 0156  Weight: 90.5 kg    Intake/Output: No intake/output data recorded.   Intake/Output this shift:  Total I/O In: 240 [P.O.:240] Out: -   Physical Exam: General: NAD,   Head: Normocephalic, atraumatic. Moist oral mucosal membranes  Eyes: Anicteric, PERRL  Neck: Supple, trachea midline  Lungs:  Clear to auscultation  Heart: Regular rate and rhythm  Abdomen:  Soft, nontender,   Extremities: no peripheral edema.  Neurologic: Alert to self and place  Skin: No lesions  Access: Left AVF    Basic Metabolic Panel: Recent Labs  Lab 11/21/17 0205 11/21/17 0853  NA 137  --   K 3.3*  --   CL 103  --   CO2 25  --   GLUCOSE 109*  --   BUN 20  --   CREATININE 1.64*  --   CALCIUM 8.0*  --   MG  --  1.9    Liver Function Tests: Recent Labs  Lab 11/21/17 0205  AST 23  ALT 15  ALKPHOS 58  BILITOT 0.8  PROT 7.3  ALBUMIN 3.6   No results for input(s): LIPASE, AMYLASE in the last 168 hours. No results for input(s): AMMONIA in the last 168 hours.  CBC: Recent Labs  Lab 11/21/17 0205  WBC 7.6  NEUTROABS 5.7  HGB 10.3*  HCT 32.3*  MCV 76.5*  PLT 164    Cardiac Enzymes: No results for input(s): CKTOTAL, CKMB, CKMBINDEX, TROPONINI in the last 168 hours.  BNP: Invalid input(s): POCBNP  CBG: No results for input(s): GLUCAP in the last 168  hours.  Microbiology: Results for orders placed or performed during the hospital encounter of 11/21/17  Culture, blood (routine x 2)     Status: None (Preliminary result)   Collection Time: 11/21/17  2:05 AM  Result Value Ref Range Status   Specimen Description BLOOD RIGHT WRIST  Final   Special Requests   Final    BOTTLES DRAWN AEROBIC AND ANAEROBIC Blood Culture adequate volume   Culture   Final    NO GROWTH < 12 HOURS Performed at [facility], [address] [place], [place] [address]    Report Status PENDING  Incomplete  Culture, blood (routine x 2)     Status: None (Preliminary result)   Collection Time: 11/21/17  2:05 AM  Result Value Ref Range Status   Specimen Description BLOOD RIGHT ASSIST CONTROL  Final   Special Requests   Final    BOTTLES DRAWN AEROBIC AND ANAEROBIC Blood Culture results may not be optimal due to an excessive volume of blood received in culture bottles   Culture   Final    NO GROWTH < 12 HOURS Performed at [facility], [address] [place], [place] [address]    Report Status PENDING  Incomplete    Coagulation Studies: No results for input(s): LABPROT, INR  in the last 72 hours.  Urinalysis: Recent Labs    11/21/17 0205  COLORURINE YELLOW*  LABSPEC 1.012  PHURINE 6.0  GLUCOSEU NEGATIVE  HGBUR MODERATE*  BILIRUBINUR NEGATIVE  KETONESUR 20*  PROTEINUR 100*  NITRITE NEGATIVE  LEUKOCYTESUR LARGE*      Imaging: Dg Chest Port 1 View  Result Date: 11/21/2017 CLINICAL DATA:  Weakness. Patient was found on couch. History of dementia. Possible urinary tract infection. EXAM: PORTABLE CHEST 1 VIEW COMPARISON:  06/29/2016 FINDINGS: Cardiac enlargement without vascular congestion or edema. No consolidation or airspace disease in the lungs. No blunting of costophrenic angles. No pneumothorax. Mediastinal contours appear intact. Calcification of the aorta. Degenerative changes in the spine and shoulders. IMPRESSION: Cardiac  enlargement. No evidence of active pulmonary disease. Electronically Signed   By: Lucienne Capers M.D.   On: 11/21/2017 02:21     Medications:   . 0.9 % NaCl with KCl 20 mEq / L 50 mL/hr at 11/21/17 0955  . [START ON 11/22/2017] cefTRIAXone (ROCEPHIN)  IV     . atorvastatin  40 mg Oral QHS  . calcitRIOL  0.25 mcg Oral Daily  . citalopram  20 mg Oral Daily  . donepezil  5 mg Oral QPM  . folic acid  1 mg Oral BID  . heparin  5,000 Units Subcutaneous Q8H  . metoprolol succinate  25 mg Oral Daily  . mycophenolate  180 mg Oral BID  . potassium chloride  20 mEq Oral BID  . sirolimus  3 mg Oral Daily   acetaminophen **OR** acetaminophen, ondansetron **OR** ondansetron (ZOFRAN) IV, polyethylene glycol  Assessment/ Plan:  [name] is a 67 y.o. black female with renal transplant 2009 on sirolimus, mycophenolate, prednisone, baseline creatinine of 1.38 on 09/14/17.  Past medical history of hypertension, dementia and renal cell carcinoma status post nephrectomy  1. Acute renal failure on chronic kidney disease stage III-T: baseline creatinine of 1.38, GFR of 46 on 09/14/17.  Immunosuppression of sirolimus, mycophenolate and prednisone.  - Continue IV fluids: NS with 20KCl at 75mL/hr  2. Hypertension: blood pressure at goal.  - metoprolol  3. Urinary Tract Infection: empiric ceftriaxone.    LOS: 0 Ramces Shomaker 10/8/20193:21 PM

## 2017-11-22 LAB — BASIC METABOLIC PANEL
Anion gap: 12 (ref 5–15)
BUN: 18 mg/dL (ref 8–23)
CHLORIDE: 104 mmol/L (ref 98–111)
CO2: 23 mmol/L (ref 22–32)
Calcium: 7.9 mg/dL — ABNORMAL LOW (ref 8.9–10.3)
Creatinine, Ser: 1.57 mg/dL — ABNORMAL HIGH (ref 0.44–1.00)
GFR calc Af Amer: 38 mL/min — ABNORMAL LOW (ref >60)
GFR calc non Af Amer: 33 mL/min — ABNORMAL LOW (ref >60)
GLUCOSE: 110 mg/dL — AB (ref 70–99)
POTASSIUM: 3.7 mmol/L (ref 3.5–5.1)
Sodium: 139 mmol/L (ref 135–145)

## 2017-11-22 MED ORDER — PREDNISONE 5 MG PO TABS
5.0000 mg | ORAL_TABLET | Freq: Every day | ORAL | Status: DC
  Administered 2017-11-23: 5 mg via ORAL
  Filled 2017-11-22: qty 1

## 2017-11-22 NOTE — Progress Notes (Signed)
[facility]  ROUNDING NOTE   Subjective:   Creatinine 1.57 (1.64)  Patient states she is feeling better. No complaints. No family at bedside.   Objective:  Vital signs in last 24 hours:  Temp:  [98.9 F (37.2 C)-99.2 F (37.3 C)] 98.9 F (37.2 C) (10/09 0619) Pulse Rate:  [67-87] 80 (10/09 0619) Resp:  [18] 18 (10/09 0619) BP: (112-131)/(57-60) 131/58 (10/09 0619) SpO2:  [96 %-99 %] 99 % (10/09 0619)  Weight change:  Filed Weights   11/21/17 0156  Weight: 90.5 kg    Intake/Output: I/O last 3 completed shifts: In: 1342.7 [P.O.:240; I.V.:1002.7; IV Piggyback:100] Out: 4 [Urine:2; Stool:2]   Intake/Output this shift:  No intake/output data recorded.  Physical Exam: General: NAD,   Head: Normocephalic, atraumatic. Moist oral mucosal membranes  Eyes: Anicteric, PERRL  Neck: Supple, trachea midline  Lungs:  Clear to auscultation  Heart: Regular rate and rhythm  Abdomen:  Soft, nontender,   Extremities: no peripheral edema.  Neurologic: Alert to self and place  Skin: No lesions  Access: Left AVF    Basic Metabolic Panel: Recent Labs  Lab 11/21/17 0205 11/21/17 0853 11/22/17 0353  NA 137  --  139  K 3.3*  --  3.7  CL 103  --  104  CO2 25  --  23  GLUCOSE 109*  --  110*  BUN 20  --  18  CREATININE 1.64*  --  1.57*  CALCIUM 8.0*  --  7.9*  MG  --  1.9  --     Liver Function Tests: Recent Labs  Lab 11/21/17 0205  AST 23  ALT 15  ALKPHOS 58  BILITOT 0.8  PROT 7.3  ALBUMIN 3.6   No results for input(s): LIPASE, AMYLASE in the last 168 hours. No results for input(s): AMMONIA in the last 168 hours.  CBC: Recent Labs  Lab 11/21/17 0205  WBC 7.6  NEUTROABS 5.7  HGB 10.3*  HCT 32.3*  MCV 76.5*  PLT 164    Cardiac Enzymes: No results for input(s): CKTOTAL, CKMB, CKMBINDEX, TROPONINI in the last 168 hours.  BNP: Invalid input(s): POCBNP  CBG: No results for input(s): GLUCAP in the last 168 hours.  Microbiology: Results for  orders placed or performed during the hospital encounter of 11/21/17  Culture, blood (routine x 2)     Status: None (Preliminary result)   Collection Time: 11/21/17  2:05 AM  Result Value Ref Range Status   Specimen Description BLOOD RIGHT WRIST  Final   Special Requests   Final    BOTTLES DRAWN AEROBIC AND ANAEROBIC Blood Culture adequate volume   Culture   Final    NO GROWTH 1 DAY Performed at [facility], [address], [place], [place] [address]    Report Status PENDING  Incomplete  Culture, blood (routine x 2)     Status: None (Preliminary result)   Collection Time: 11/21/17  2:05 AM  Result Value Ref Range Status   Specimen Description BLOOD RIGHT ASSIST CONTROL  Final   Special Requests   Final    BOTTLES DRAWN AEROBIC AND ANAEROBIC Blood Culture results may not be optimal due to an excessive volume of blood received in culture bottles   Culture   Final    NO GROWTH 1 DAY Performed at [facility], [address], [place], [place] [address]    Report Status PENDING  Incomplete  Urine culture     Status: Abnormal (Preliminary result)  Collection Time: 11/21/17  2:05 AM  Result Value Ref Range Status   Specimen Description   Final    URINE, RANDOM Performed at [facility], [address], [place], [place] [address]    Special Requests   Final    NONE Performed at [facility], [address] [place], [place] [address]    Culture >=100,000 COLONIES/mL GRAM NEGATIVE RODS (A)  Final   Report Status PENDING  Incomplete    Coagulation Studies: No results for input(s): LABPROT, INR in the last 72 hours.  Urinalysis: Recent Labs    11/21/17 0205  COLORURINE YELLOW*  LABSPEC 1.012  PHURINE 6.0  GLUCOSEU NEGATIVE  HGBUR MODERATE*  BILIRUBINUR NEGATIVE  KETONESUR 20*  PROTEINUR 100*  NITRITE NEGATIVE  LEUKOCYTESUR LARGE*      Imaging: Dg Chest Port 1 View  Result Date: 11/21/2017 CLINICAL DATA:  Weakness. Patient was  found on couch. History of dementia. Possible urinary tract infection. EXAM: PORTABLE CHEST 1 VIEW COMPARISON:  06/29/2016 FINDINGS: Cardiac enlargement without vascular congestion or edema. No consolidation or airspace disease in the lungs. No blunting of costophrenic angles. No pneumothorax. Mediastinal contours appear intact. Calcification of the aorta. Degenerative changes in the spine and shoulders. IMPRESSION: Cardiac enlargement. No evidence of active pulmonary disease. Electronically Signed   By: Lucienne Capers M.D.   On: 11/21/2017 02:21     Medications:   . 0.9 % NaCl with KCl 20 mEq / L 50 mL/hr at 11/22/17 0631  . cefTRIAXone (ROCEPHIN)  IV Stopped (11/22/17 0342)   . atorvastatin  40 mg Oral QHS  . calcitRIOL  0.25 mcg Oral Daily  . citalopram  20 mg Oral Daily  . donepezil  5 mg Oral QPM  . folic acid  1 mg Oral BID  . heparin  5,000 Units Subcutaneous Q8H  . Influenza vac split quadrivalent PF  0.5 mL Intramuscular Tomorrow-1000  . metoprolol succinate  25 mg Oral Daily  . mycophenolate  180 mg Oral BID  . pneumococcal 23 valent vaccine  0.5 mL Intramuscular Tomorrow-1000  . potassium chloride  20 mEq Oral BID  . [START ON 11/23/2017] predniSONE  5 mg Oral Q breakfast  . sirolimus  3 mg Oral Daily   acetaminophen **OR** acetaminophen, ondansetron **OR** ondansetron (ZOFRAN) IV, polyethylene glycol  Assessment/ Plan:  [name] is a 67 y.o. black female with renal transplant 2009 on sirolimus, mycophenolate, prednisone, baseline creatinine of 1.38 on 09/14/17.  Past medical history of hypertension, dementia and renal cell carcinoma status post nephrectomy  1. Acute renal failure on chronic kidney disease stage III-T: baseline creatinine of 1.38, GFR of 46 on 09/14/17.  Immunosuppression of sirolimus, mycophenolate and prednisone.  - IV fluids: NS with 20KCl at 81mL/hr - Encourage PO intake  2. Hypertension: blood pressure at goal.  - metoprolol  3. Urinary  Tract Infection: empiric ceftriaxone. Gram negative rods on urine culture 10/8.    LOS: 1 [name] 10/9/201910:07 AM

## 2017-11-22 NOTE — Progress Notes (Signed)
[facility] NAME: [name]    MR#:  [id number]  DATE OF BIRTH:  1950/11/19  SUBJECTIVE:  CHIEF COMPLAINT:   Chief Complaint  Patient presents with  . Urinary Tract Infection   Confused.  No pain.  Afebrile today.  REVIEW OF SYSTEMS:    Review of Systems  Unable to perform ROS: Dementia    DRUG ALLERGIES:   Allergies  Allergen Reactions  . Ace Inhibitors Cough  . Lisinopril Cough    VITALS:  Blood pressure (!) 131/58, pulse 80, temperature 98.9 F (37.2 C), temperature source Oral, resp. rate 18, weight 90.5 kg, SpO2 99 %.  PHYSICAL EXAMINATION:   Physical Exam  GENERAL:  67 y.o.-year-old patient lying in the bed with no acute distress.  EYES: Pupils equal, round, reactive to light and accommodation. No scleral icterus. Extraocular muscles intact.  HEENT: Head atraumatic, normocephalic. Oropharynx and nasopharynx clear.  NECK:  Supple, no jugular venous distention. No thyroid enlargement, no tenderness.  LUNGS: Normal breath sounds bilaterally, no wheezing, rales, rhonchi. No use of accessory muscles of respiration.  CARDIOVASCULAR: S1, S2 normal. No murmurs, rubs, or gallops.  ABDOMEN: Soft, nontender, nondistended. Bowel sounds present. No organomegaly or mass.  EXTREMITIES: No cyanosis, clubbing or edema b/l.    NEUROLOGIC: Cranial nerves II through XII are intact. No focal Motor or sensory deficits b/l.   PSYCHIATRIC: The patient is alert and awake.  Not oriented to place or time SKIN: No obvious rash, lesion, or ulcer.   LABORATORY PANEL:   CBC Recent Labs  Lab 11/21/17 0205  WBC 7.6  HGB 10.3*  HCT 32.3*  PLT 164   ------------------------------------------------------------------------------------------------------------------ Chemistries  Recent Labs  Lab 11/21/17 0205 11/21/17 0853 11/22/17 0353  NA 137  --  139  K 3.3*  --  3.7  CL 103  --  104  CO2 25  --  23  GLUCOSE 109*  --   110*  BUN 20  --  18  CREATININE 1.64*  --  1.57*  CALCIUM 8.0*  --  7.9*  MG  --  1.9  --   AST 23  --   --   ALT 15  --   --   ALKPHOS 58  --   --   BILITOT 0.8  --   --    ------------------------------------------------------------------------------------------------------------------  Cardiac Enzymes No results for input(s): TROPONINI in the last 168 hours. ------------------------------------------------------------------------------------------------------------------  RADIOLOGY:  Dg Chest Port 1 View  Result Date: 11/21/2017 CLINICAL DATA:  Weakness. Patient was found on couch. History of dementia. Possible urinary tract infection. EXAM: PORTABLE CHEST 1 VIEW COMPARISON:  06/29/2016 FINDINGS: Cardiac enlargement without vascular congestion or edema. No consolidation or airspace disease in the lungs. No blunting of costophrenic angles. No pneumothorax. Mediastinal contours appear intact. Calcification of the aorta. Degenerative changes in the spine and shoulders. IMPRESSION: Cardiac enlargement. No evidence of active pulmonary disease. Electronically Signed   By: Lucienne Capers M.D.   On: 11/21/2017 02:21     ASSESSMENT AND PLAN:   *Gram-negative rod UTI Continue IV ceftriaxone.  Waiting for final sensitivities.  Patient on immunosuppressant drugs.  *Acute kidney injury over CKD stage III.  Improved with IV fluids.  Appreciate nephrology input.  Continue immunosuppressant therapy for kidney transplant.  *Dementia.  Monitor for inpatient delirium.  *DVT prophylaxis with Lovenox  All the records are reviewed and case discussed with Care Management/Social Worker Management plans discussed with the patient,  family and they are in agreement.  CODE STATUS: FULL CODE  DVT Prophylaxis: SCDs  TOTAL TIME TAKING CARE OF THIS PATIENT: 35 minutes.   POSSIBLE D/C IN 1-2 DAYS, DEPENDING ON CLINICAL CONDITION.  Leia Alf Blayke Cordrey M.D on 11/22/2017 at 12:27 PM  Between 7am to 6pm -  Pager - [phone]  After 6pm go to [email or web address] - password [email or web address] [facility]  Office  [phone]  CC: Primary care physician; Center, [facility]  Note: This dictation was prepared with Dragon dictation along with smaller phrase technology. Any transcriptional errors that result from this process are unintentional.

## 2017-11-22 NOTE — Progress Notes (Signed)
Patient more agitated than before. Pulling at IV and does not appear to understand why she can't take it out. Patient has jumped out of bed twice with no regard for the bed alarm. Ordered a low bed and when transfer patient onto low bed when it arrives on the floor.

## 2017-11-23 LAB — URINE CULTURE

## 2017-11-23 MED ORDER — CIPROFLOXACIN HCL 250 MG PO TABS: 250.0000 mg | ORAL_TABLET | Freq: Two times a day (BID) | ORAL | 0 refills | Status: AC

## 2017-11-23 NOTE — Progress Notes (Signed)
[facility]  ROUNDING NOTE   Subjective:   Creatinine No New labs  Worked with PT this morning  Objective:  Vital signs in last 24 hours:  Temp:  [98.4 F (36.9 C)-98.9 F (37.2 C)] 98.9 F (37.2 C) (10/09 2000) Pulse Rate:  [70-85] 85 (10/09 2000) Resp:  [21-22] 22 (10/09 2000) BP: (127-157)/(54-79) 157/79 (10/09 2000) SpO2:  [100 %] 100 % (10/09 2000)  Weight change:  Filed Weights   11/21/17 0156  Weight: 90.5 kg    Intake/Output: I/O last 3 completed shifts: In: 2229.5 [P.O.:240; I.V.:1789.5; IV Piggyback:200] Out: -    Intake/Output this shift:  Total I/O In: 178 [I.V.:178] Out: -   Physical Exam: General: NAD,   Head: Normocephalic, atraumatic. Moist oral mucosal membranes  Eyes: Anicteric, PERRL  Neck: Supple, trachea midline  Lungs:  Clear to auscultation  Heart: Regular rate and rhythm  Abdomen:  Soft, nontender,   Extremities: no peripheral edema.  Neurologic: Alert to self and place  Skin: No lesions  Access: Left AVF    Basic Metabolic Panel: Recent Labs  Lab 11/21/17 0205 11/21/17 0853 11/22/17 0353  NA 137  --  139  K 3.3*  --  3.7  CL 103  --  104  CO2 25  --  23  GLUCOSE 109*  --  110*  BUN 20  --  18  CREATININE 1.64*  --  1.57*  CALCIUM 8.0*  --  7.9*  MG  --  1.9  --     Liver Function Tests: Recent Labs  Lab 11/21/17 0205  AST 23  ALT 15  ALKPHOS 58  BILITOT 0.8  PROT 7.3  ALBUMIN 3.6   No results for input(s): LIPASE, AMYLASE in the last 168 hours. No results for input(s): AMMONIA in the last 168 hours.  CBC: Recent Labs  Lab 11/21/17 0205  WBC 7.6  NEUTROABS 5.7  HGB 10.3*  HCT 32.3*  MCV 76.5*  PLT 164    Cardiac Enzymes: No results for input(s): CKTOTAL, CKMB, CKMBINDEX, TROPONINI in the last 168 hours.  BNP: Invalid input(s): POCBNP  CBG: No results for input(s): GLUCAP in the last 168 hours.  Microbiology: Results for orders placed or performed during the hospital encounter  of 11/21/17  Culture, blood (routine x 2)     Status: None (Preliminary result)   Collection Time: 11/21/17  2:05 AM  Result Value Ref Range Status   Specimen Description BLOOD RIGHT WRIST  Final   Special Requests   Final    BOTTLES DRAWN AEROBIC AND ANAEROBIC Blood Culture adequate volume   Culture   Final    NO GROWTH 2 DAYS Performed at [facility], [address] [place], [place] [address]    Report Status PENDING  Incomplete  Culture, blood (routine x 2)     Status: None (Preliminary result)   Collection Time: 11/21/17  2:05 AM  Result Value Ref Range Status   Specimen Description BLOOD RIGHT ASSIST CONTROL  Final   Special Requests   Final    BOTTLES DRAWN AEROBIC AND ANAEROBIC Blood Culture results may not be optimal due to an excessive volume of blood received in culture bottles   Culture   Final    NO GROWTH 2 DAYS Performed at [facility], [address] [place], [place] [address]    Report Status PENDING  Incomplete  Urine culture     Status: Abnormal   Collection Time: 11/21/17  2:05 AM  Result Value Ref Range Status   Specimen Description   Final    URINE, RANDOM Performed at [facility], [address] [place], [place] [address]    Special Requests   Final    NONE Performed at [facility], [place], [place] [address]    Culture >=100,000 COLONIES/mL KLEBSIELLA PNEUMONIAE (A)  Final   Report Status 11/23/2017 FINAL  Final   Organism ID, Bacteria KLEBSIELLA PNEUMONIAE (A)  Final      Susceptibility   Klebsiella pneumoniae - MIC*    AMPICILLIN RESISTANT Resistant     CEFAZOLIN <=4 SENSITIVE Sensitive     CEFTRIAXONE <=1 SENSITIVE Sensitive     CIPROFLOXACIN <=0.25 SENSITIVE Sensitive     GENTAMICIN <=1 SENSITIVE Sensitive     IMIPENEM <=0.25 SENSITIVE Sensitive     NITROFURANTOIN 32 SENSITIVE Sensitive     TRIMETH/SULFA <=20 SENSITIVE Sensitive     AMPICILLIN/SULBACTAM 4 SENSITIVE Sensitive      PIP/TAZO <=4 SENSITIVE Sensitive     Extended ESBL NEGATIVE Sensitive     * >=100,000 COLONIES/mL KLEBSIELLA PNEUMONIAE    Coagulation Studies: No results for input(s): LABPROT, INR in the last 72 hours.  Urinalysis: Recent Labs    11/21/17 0205  COLORURINE YELLOW*  LABSPEC 1.012  PHURINE 6.0  GLUCOSEU NEGATIVE  HGBUR MODERATE*  BILIRUBINUR NEGATIVE  KETONESUR 20*  PROTEINUR 100*  NITRITE NEGATIVE  LEUKOCYTESUR LARGE*      Imaging: No results found.   Medications:   . 0.9 % NaCl with KCl 20 mEq / L 50 mL/hr at 11/23/17 1004  . cefTRIAXone (ROCEPHIN)  IV Stopped (11/23/17 0346)   . atorvastatin  40 mg Oral QHS  . calcitRIOL  0.25 mcg Oral Daily  . citalopram  20 mg Oral Daily  . donepezil  5 mg Oral QPM  . folic acid  1 mg Oral BID  . heparin  5,000 Units Subcutaneous Q8H  . metoprolol succinate  25 mg Oral Daily  . mycophenolate  180 mg Oral BID  . potassium chloride  20 mEq Oral BID  . predniSONE  5 mg Oral Q breakfast  . sirolimus  3 mg Oral Daily   acetaminophen **OR** acetaminophen, ondansetron **OR** ondansetron (ZOFRAN) IV, polyethylene glycol  Assessment/ Plan:  [name] is a 67 y.o. black female with renal transplant 2009 on sirolimus, mycophenolate, prednisone, baseline creatinine of 1.38 on 09/14/17.  Past medical history of hypertension, dementia and renal cell carcinoma status post nephrectomy  1. Acute renal failure on chronic kidney disease stage III-T: baseline creatinine of 1.38, GFR of 46 on 09/14/17.  Immunosuppression of sirolimus, mycophenolate and prednisone.  - Encourage PO intake  2. Hypertension: blood pressure at goal.  - metoprolol  3. Urinary Tract Infection: empiric ceftriaxone. Klebsiella - transition to PO antibiotics today   LOS: 2 [name] 10/10/201911:52 AM

## 2017-11-23 NOTE — Care Management Note (Signed)
Case Management Note  Patient Details  Name: [name] MRN: [id number] Date of Birth: [birth year]   Patient was discharged home today .  Patient was admitted with UTI.  Patient with history of dementia.  Patient lives at home with daughter.  Assessment completed via phone with daughter [name].  PT has assessed patient and recommends home health.  Daughter agreeable to services.  States they have used [facility] in the past and would like to use them again.  Referral made to [facility] with [facility]. Daughter request hat PCS services be set up.  RNCm explained that I had a reference list of PCS services, however they would be private pay.  Daughter declined to receive this list.  She states she would pursue through her Medicaid in obtaining PCS hours.  Daughter states that the patient's husband (they are separated) he lives in a different home, however he provides transportation to all of her doctors appointment.  BSC was ordered.  Daughter request that it be delivered to home.  Brad with [facility] notified. RNCM signing off.   Subjective/Objective:                    Action/Plan:   Expected Discharge Date:  11/23/17               Expected Discharge Plan:  [facility]  In-House Referral:     Discharge planning Services  CM Consult  Post Acute Care Choice:  Home Health, Durable Medical Equipment Choice offered to:  Adult Children  DME Arranged:  3-N-1 DME Agency:     HH Arranged:  PT, Nurse's Aide, Social Work [employer] Agency:  [facility]  Status of Service:  Completed, signed off  If discussed at [employer], dates discussed:    Additional Comments:  Sheri Sessions, RN 11/23/2017, 3:58 PM

## 2017-11-23 NOTE — Evaluation (Signed)
Physical Therapy Evaluation Patient Details Name: [name] MRN: [id number] DOB: 23-May-1950 Today's Date: 11/23/2017   History of Present Illness  Pt is a 67 y.o female presenting with UTI and brought in after found sitting in feces and urine at home. PMH significant for dementia, renal transplant, HTN, and cancer.   Clinical Impression  Upon arrival pt sleeping but quickly waking up to name. Pt pleasant but only oriented to person. Pt needing no physical assist for basic bed mobility however requiring significant time and clearly confused with where to place her UE and LE. Pt requires mod assist to perform sit to stand and significant VC for hand placement, scooting of hips, and holding RW once standing. Pt ambulates well with RW but 1 LOB noted during ambulation with no RW. Pt's sister indicating over the phone that pt does not want to use an AD at home but she fell most recently last week. Pt confused t/o treatment however this seems to be baseline. Evaluation paused in the middle for pt having a bowel movement assisted RN with set up. Pt would benefit from skilled PT via HHPT to improve safety at home and improve functional deficits.    Follow Up Recommendations Home health PT    Equipment Recommendations  Rolling walker with 5" wheels(pt has RW at home but does not use it)    Recommendations for Other Services       Precautions / Restrictions Precautions Precautions: Fall Restrictions Weight Bearing Restrictions: No      Mobility  Bed Mobility Overal bed mobility: Needs Assistance Bed Mobility: Supine to Sit     Supine to sit: Supervision     General bed mobility comments: Pt requiring significant cuing and increased time to perform bed mobility. Pt confused and having difficulty understand where to put her hands and where to move her legs however able to complete without physical assist.   Transfers Overall transfer level: Needs assistance Equipment used: Rolling  walker (2 wheeled) Transfers: Sit to/from Stand Sit to Stand: Mod assist         General transfer comment: Pt with good effort, mod assist required for 2/3 attempts, pt sitting down immediately following first attempt for unknown reason, after second attempt pt able to maintain standing but frequently letting go of RW and forgetting to hold on. Pt performing sit to stand from chair with only min assist but requiring increased time and significant verbal cues for hand placement and to hold onto walker once standing.   Ambulation/Gait Ambulation/Gait assistance: Min guard Gait Distance (Feet): 200 Feet Assistive device: Rolling walker (2 wheeled)       General Gait Details: RW utilized for first 125 feet and no AD for 75 feet. Pt steady with RW but letting go occasionally and forgetting she has it. 1 LOB with no AD requiring 1 hand hold on railing to regain balance. No differences in gait mechanics noted with RW versus no AD  Stairs            Wheelchair Mobility    Modified Rankin (Stroke Patients Only)       Balance Overall balance assessment: Mild deficits observed, not formally tested                                           Pertinent Vitals/Pain Pain Assessment: Faces Faces Pain Scale: Hurts little more Pain Location: R  shoulder Pain Intervention(s): Monitored during session    Home Living Family/patient expects to be discharged to:: Private residence Living Arrangements: Children Available Help at Discharge: Available PRN/intermittently Type of Home: Apartment Home Access: Stairs to enter Entrance Stairs-Rails: Right Entrance Stairs-Number of Steps: pt reporting a few but cannot identify exactly how many, last PT eval indicating 2 Home Layout: One level Home Equipment: Walker - 2 wheels;Cane - single point      Prior Function Level of Independence: Independent         Comments: Pt lives with daughter who works, pt daughter on phone  indicating pt fell last week. Pt independent during the day with no AD because according to daughter pt refuses to use RW or SPC.      Hand Dominance        Extremity/Trunk Assessment   Upper Extremity Assessment Upper Extremity Assessment: Generalized weakness(age appropriate deficits noted, R UE grossly 3/5 but unable to tolerate resistance in shoudler due to pain, L UE >3/5 for tasks assessed)    Lower Extremity Assessment Lower Extremity Assessment: Generalized weakness(age appropriate deficits noted grossly >3/5 for all LE assessed)       Communication   Communication: No difficulties  Cognition Arousal/Alertness: Awake/alert Behavior During Therapy: WFL for tasks assessed/performed Overall Cognitive Status: History of cognitive impairments - at baseline                                 General Comments: Pt with dementia and confusion however at baseline cognitively during evaluation      General Comments General comments (skin integrity, edema, etc.): HR in mid 80's and SpO2 in mid 90's prior to evaluation not tested during ambulation d/t pt confusion when stopped in the middle of ambulating and no reported symptoms    Exercises     Assessment/Plan    PT Assessment Patient needs continued PT services  PT Problem List Decreased strength;Decreased activity tolerance;Decreased balance;Decreased mobility;Decreased cognition;Decreased knowledge of use of DME;Decreased safety awareness       PT Treatment Interventions DME instruction;Gait training;Stair training;Functional mobility training;Therapeutic exercise;Therapeutic activities;Balance training;Cognitive remediation;Patient/family education    PT Goals (Current goals can be found in the Care Plan section)  Acute Rehab PT Goals Patient Stated Goal: return home and be safe PT Goal Formulation: With patient Time For Goal Achievement: 12/07/17 Potential to Achieve Goals: Good    Frequency Min  2X/week   Barriers to discharge        Co-evaluation               AM-PAC PT "6 Clicks" Daily Activity  Outcome Measure Difficulty turning over in bed (including adjusting bedclothes, sheets and blankets)?: A Little Difficulty moving from lying on back to sitting on the side of the bed? : A Little Difficulty sitting down on and standing up from a chair with arms (e.g., wheelchair, bedside commode, etc,.)?: Unable Help needed moving to and from a bed to chair (including a wheelchair)?: A Little Help needed walking in hospital room?: A Little Help needed climbing 3-5 steps with a railing? : A Little 6 Click Score: 16    End of Session Equipment Utilized During Treatment: Gait belt Activity Tolerance: Patient tolerated treatment well Patient left: in chair;with call bell/phone within reach;with chair alarm set Nurse Communication: Mobility status PT Visit Diagnosis: Unsteadiness on feet (R26.81);History of falling (Z91.81);Muscle weakness (generalized) (M62.81);Difficulty in walking, not elsewhere classified (R26.2)  Time: 0927-1009 PT Time Calculation (min) (ACUTE ONLY): 42 min   Charges:              Ernie Avena, SPT 11/23/2017, 11:50 AM

## 2017-11-24 NOTE — Discharge Summary (Signed)
[facility] NAME: [name]    MR#:  [id number]  DATE OF BIRTH:  11-22-1950  DATE OF ADMISSION:  11/21/2017 ADMITTING PHYSICIAN: Fritzi Mandes, MD  DATE OF DISCHARGE: 11/23/2017  2:03 PM  PRIMARY CARE PHYSICIAN: [facility] DIAGNOSIS:  Lower urinary tract infectious disease [N39.0] Hemodialysis patient ([place]) [Z99.2] Fever, unspecified fever cause [R50.9] Renal transplant recipient [Z94.0]  DISCHARGE DIAGNOSIS:  Active Problems:   Sepsis ([place])   SECONDARY DIAGNOSIS:   Past Medical History:  Diagnosis Date  . Anemia   . Cancer ([place])   . Dementia ([place])   . GERD (gastroesophageal reflux disease)   . Hypertension   . Renal disorder      ADMITTING HISTORY  HISTORY OF PRESENT ILLNESS:  [name]  is a 67 y.o. female with a known history of dementia, hypertension, CKD stage II, chronic anemia comes to the emergency room brought in by EMS after daughter found patient covered in urine and feces. She had a fever low-grade at home however here temperature was 102.3. Was noted patient have UTI. Patient denies any complaints other than dysuria. She denies any chest pain shortness of breath. She is not the best historian. No family in the ER patient received IV Rocephin receiving IV fluids and being admitted for sepsis due to UTI.  HOSPITAL COURSE:   *Klebsiella UTI Treated with IV ceftriaxone on admission.  Ciprofloxacin on discharge Afebrile and normal WBC by day of discharge  *Acute kidney injury over CKD stage III.  Improved with IV fluids.  Appreciate nephrology input.  Continued immunosuppressant therapy for kidney transplant.  *Dementia.  Monitor for inpatient delirium.  Patient was discharged home in stable condition.  CONSULTS OBTAINED:    DRUG ALLERGIES:   Allergies  Allergen Reactions  . Ace Inhibitors Cough  . Lisinopril Cough    DISCHARGE MEDICATIONS:    Allergies as of 11/23/2017      Reactions   Ace Inhibitors Cough   Lisinopril Cough      Medication List    TAKE these medications   atorvastatin 40 MG tablet Commonly known as:  LIPITOR Take 40 mg by mouth at bedtime.   calcitRIOL 0.25 MCG capsule Commonly known as:  ROCALTROL Take 0.25 mcg by mouth daily.   ciprofloxacin 250 MG tablet Commonly known as:  CIPRO Take 1 tablet (250 mg total) by mouth 2 (two) times daily for 3 days.   citalopram 20 MG tablet Commonly known as:  CELEXA Take 20 mg by mouth daily.   donepezil 5 MG tablet Commonly known as:  ARICEPT Take 5 mg by mouth every evening.   folic acid 1 MG tablet Commonly known as:  FOLVITE Take 1 mg by mouth 2 (two) times daily.   furosemide 20 MG tablet Commonly known as:  LASIX Take 20 mg by mouth 2 (two) times daily.   metoprolol succinate 25 MG 24 hr tablet Commonly known as:  TOPROL-XL Take 1 tablet (25 mg total) by mouth daily.   mycophenolate 180 MG EC tablet Commonly known as:  MYFORTIC Take 180 mg by mouth 2 (two) times daily.   potassium chloride 10 MEQ tablet Commonly known as:  K-DUR,KLOR-CON Take 20 mEq by mouth 2 (two) times daily.   sirolimus 1 MG tablet Commonly known as:  RAPAMUNE Take 3 mg by mouth daily.   TUMS ULTRA 1000 400 MG chewable tablet Generic drug:  calcium elemental as carbonate Chew  1,000 mg by mouth daily as needed for heartburn.       Today   VITAL SIGNS:  Blood pressure (!) 157/79, pulse 85, temperature 98.9 F (37.2 C), temperature source Oral, resp. rate (!) 22, weight 90.5 kg, SpO2 100 %.  I/O:  No intake or output data in the 24 hours ending 11/24/17 1428  PHYSICAL EXAMINATION:  Physical Exam  GENERAL:  66 y.o.-year-old patient lying in the bed with no acute distress.  LUNGS: Normal breath sounds bilaterally, no wheezing, rales,rhonchi or crepitation. No use of accessory muscles of respiration.  CARDIOVASCULAR: S1, S2 normal. No murmurs, rubs, or  gallops.  ABDOMEN: Soft, non-tender, non-distended. Bowel sounds present. No organomegaly or mass.  NEUROLOGIC: Moves all 4 extremities. PSYCHIATRIC: The patient is alert and awake. confused SKIN: No obvious rash, lesion, or ulcer.   DATA REVIEW:   CBC Recent Labs  Lab 11/21/17 0205  WBC 7.6  HGB 10.3*  HCT 32.3*  PLT 164    Chemistries  Recent Labs  Lab 11/21/17 0205 11/21/17 0853 11/22/17 0353  NA 137  --  139  K 3.3*  --  3.7  CL 103  --  104  CO2 25  --  23  GLUCOSE 109*  --  110*  BUN 20  --  18  CREATININE 1.64*  --  1.57*  CALCIUM 8.0*  --  7.9*  MG  --  1.9  --   AST 23  --   --   ALT 15  --   --   ALKPHOS 58  --   --   BILITOT 0.8  --   --     Cardiac Enzymes No results for input(s): TROPONINI in the last 168 hours.  Microbiology Results  Results for orders placed or performed during the hospital encounter of 11/21/17  Culture, blood (routine x 2)     Status: None (Preliminary result)   Collection Time: 11/21/17  2:05 AM  Result Value Ref Range Status   Specimen Description BLOOD RIGHT WRIST  Final   Special Requests   Final    BOTTLES DRAWN AEROBIC AND ANAEROBIC Blood Culture adequate volume   Culture   Final    NO GROWTH 2 DAYS Performed at [facility], [address], [place], [place] [address]    Report Status PENDING  Incomplete  Culture, blood (routine x 2)     Status: None (Preliminary result)   Collection Time: 11/21/17  2:05 AM  Result Value Ref Range Status   Specimen Description BLOOD RIGHT ASSIST CONTROL  Final   Special Requests   Final    BOTTLES DRAWN AEROBIC AND ANAEROBIC Blood Culture results may not be optimal due to an excessive volume of blood received in culture bottles   Culture   Final    NO GROWTH 2 DAYS Performed at [facility], [address], [place], [place] [address]    Report Status PENDING  Incomplete  Urine culture     Status: Abnormal   Collection Time: 11/21/17  2:05 AM  Result Value  Ref Range Status   Specimen Description   Final    URINE, RANDOM Performed at [facility], [address] [place], [place] [address]    Special Requests   Final    NONE Performed at [facility], [address] [place], [place] [address]    Culture >=100,000 COLONIES/mL KLEBSIELLA PNEUMONIAE (A)  Final   Report Status 11/23/2017 FINAL  Final  Organism ID, Bacteria KLEBSIELLA PNEUMONIAE (A)  Final      Susceptibility   Klebsiella pneumoniae - MIC*    AMPICILLIN RESISTANT Resistant     CEFAZOLIN <=4 SENSITIVE Sensitive     CEFTRIAXONE <=1 SENSITIVE Sensitive     CIPROFLOXACIN <=0.25 SENSITIVE Sensitive     GENTAMICIN <=1 SENSITIVE Sensitive     IMIPENEM <=0.25 SENSITIVE Sensitive     NITROFURANTOIN 32 SENSITIVE Sensitive     TRIMETH/SULFA <=20 SENSITIVE Sensitive     AMPICILLIN/SULBACTAM 4 SENSITIVE Sensitive     PIP/TAZO <=4 SENSITIVE Sensitive     Extended ESBL NEGATIVE Sensitive     * >=100,000 COLONIES/mL KLEBSIELLA PNEUMONIAE    RADIOLOGY:  No results found.  Follow up with PCP in 1 week.  Management plans discussed with the patient, family and they are in agreement.  CODE STATUS:  Code Status History    Date Active Date Inactive Code Status Order ID Comments User Context   11/21/2017 0839 11/23/2017 1708 Full Code [id number]  Fritzi Mandes, MD Inpatient   06/01/2017 1402 06/03/2017 1831 Full Code [id number]  Loletha Grayer, MD ED   06/29/2016 2314 07/02/2016 1712 Full Code [id number]  Lance Coon, MD Inpatient   07/06/2014 2201 07/08/2014 1537 Full Code [id number]  Hower, Aaron Mose, MD ED      TOTAL TIME TAKING CARE OF THIS PATIENT ON DAY OF DISCHARGE: more than 30 minutes.   [name] M.D on 11/24/2017 at 2:28 PM  Between 7am to 6pm - Pager - [phone]  After 6pm go to [email or web address] - password [email or web address] [facility]  Office  [phone]  CC: Primary care physician; [facility], [facility]  Note: This  dictation was prepared with Dragon dictation along with smaller phrase technology. Any transcriptional errors that result from this process are unintentional.

## 2017-11-26 LAB — CULTURE, BLOOD (ROUTINE X 2)
Culture: NO GROWTH
Culture: NO GROWTH
SPECIAL REQUESTS: ADEQUATE

## 2017-11-28 ENCOUNTER — Telehealth: Payer: Self-pay

## 2017-11-28 NOTE — Telephone Encounter (Signed)
Flagged on EMMI report for not knowing who to call about changes in condition and being unsure if she received her discharge papers.  First attempt to reach patient made 11/28/17 at 3:43pm, however unable to reach patient.  Left voicemail encouraging callback. Will attempt at later time.

## 2018-01-10 ENCOUNTER — Other Ambulatory Visit: Payer: Self-pay | Admitting: Family Medicine

## 2018-01-10 DIAGNOSIS — Z13820 Encounter for screening for osteoporosis: Secondary | ICD-10-CM

## 2018-01-10 DIAGNOSIS — Z1231 Encounter for screening mammogram for malignant neoplasm of breast: Secondary | ICD-10-CM

## 2018-03-06 ENCOUNTER — Ambulatory Visit
Admission: RE | Admit: 2018-03-06 | Discharge: 2018-03-06 | Disposition: A | Discharge status: Home / Self Care | Payer: Medicare Other | Source: Ambulatory Visit | Attending: Family Medicine | Admitting: Family Medicine

## 2018-03-06 DIAGNOSIS — Z1231 Encounter for screening mammogram for malignant neoplasm of breast: Secondary | ICD-10-CM | POA: Insufficient documentation

## 2018-03-06 DIAGNOSIS — Z13820 Encounter for screening for osteoporosis: Secondary | ICD-10-CM | POA: Insufficient documentation

## 2018-03-06 DIAGNOSIS — M810 Age-related osteoporosis without current pathological fracture: Secondary | ICD-10-CM | POA: Diagnosis not present

## 2018-03-06 HISTORY — DX: Malignant neoplasm of unspecified kidney, except renal pelvis: C64.9

## 2018-03-06 HISTORY — DX: Malignant neoplasm of uterus, part unspecified: C55

## 2018-03-22 ENCOUNTER — Emergency Department: Payer: Medicare Other

## 2018-03-22 ENCOUNTER — Inpatient Hospital Stay
Admission: EM | Admit: 2018-03-22 | Discharge: 2018-03-26 | DRG: 689 | Disposition: A | Discharge status: Home Health | Payer: Medicare Other | Attending: Internal Medicine | Admitting: Internal Medicine

## 2018-03-22 ENCOUNTER — Other Ambulatory Visit: Payer: Self-pay

## 2018-03-22 DIAGNOSIS — N390 Urinary tract infection, site not specified: Principal | ICD-10-CM | POA: Diagnosis present

## 2018-03-22 DIAGNOSIS — N186 End stage renal disease: Secondary | ICD-10-CM

## 2018-03-22 DIAGNOSIS — G9341 Metabolic encephalopathy: Secondary | ICD-10-CM | POA: Diagnosis present

## 2018-03-22 DIAGNOSIS — Z823 Family history of stroke: Secondary | ICD-10-CM

## 2018-03-22 DIAGNOSIS — N2581 Secondary hyperparathyroidism of renal origin: Secondary | ICD-10-CM | POA: Diagnosis present

## 2018-03-22 DIAGNOSIS — Z79899 Other long term (current) drug therapy: Secondary | ICD-10-CM

## 2018-03-22 DIAGNOSIS — R410 Disorientation, unspecified: Secondary | ICD-10-CM | POA: Diagnosis present

## 2018-03-22 DIAGNOSIS — Z90710 Acquired absence of both cervix and uterus: Secondary | ICD-10-CM

## 2018-03-22 DIAGNOSIS — Z888 Allergy status to other drugs, medicaments and biological substances status: Secondary | ICD-10-CM

## 2018-03-22 DIAGNOSIS — Z811 Family history of alcohol abuse and dependence: Secondary | ICD-10-CM

## 2018-03-22 DIAGNOSIS — N183 Chronic kidney disease, stage 3 (moderate): Secondary | ICD-10-CM | POA: Diagnosis present

## 2018-03-22 DIAGNOSIS — R4182 Altered mental status, unspecified: Secondary | ICD-10-CM

## 2018-03-22 DIAGNOSIS — E785 Hyperlipidemia, unspecified: Secondary | ICD-10-CM | POA: Diagnosis present

## 2018-03-22 DIAGNOSIS — K219 Gastro-esophageal reflux disease without esophagitis: Secondary | ICD-10-CM | POA: Diagnosis present

## 2018-03-22 DIAGNOSIS — Z87891 Personal history of nicotine dependence: Secondary | ICD-10-CM

## 2018-03-22 DIAGNOSIS — F0390 Unspecified dementia without behavioral disturbance: Secondary | ICD-10-CM | POA: Diagnosis present

## 2018-03-22 DIAGNOSIS — I129 Hypertensive chronic kidney disease with stage 1 through stage 4 chronic kidney disease, or unspecified chronic kidney disease: Secondary | ICD-10-CM | POA: Diagnosis present

## 2018-03-22 DIAGNOSIS — Z85528 Personal history of other malignant neoplasm of kidney: Secondary | ICD-10-CM

## 2018-03-22 DIAGNOSIS — Z8542 Personal history of malignant neoplasm of other parts of uterus: Secondary | ICD-10-CM

## 2018-03-22 DIAGNOSIS — Z905 Acquired absence of kidney: Secondary | ICD-10-CM

## 2018-03-22 DIAGNOSIS — Z940 Kidney transplant status: Secondary | ICD-10-CM

## 2018-03-22 DIAGNOSIS — Z992 Dependence on renal dialysis: Secondary | ICD-10-CM

## 2018-03-22 DIAGNOSIS — B957 Other staphylococcus as the cause of diseases classified elsewhere: Secondary | ICD-10-CM | POA: Diagnosis present

## 2018-03-22 DIAGNOSIS — Z7952 Long term (current) use of systemic steroids: Secondary | ICD-10-CM

## 2018-03-22 NOTE — ED Provider Notes (Signed)
[facility] Emergency Department Provider Note   ____________________________________________   First MD Initiated Contact with Patient 03/22/18 2344     (approximate)  I have reviewed the triage vital signs and the nursing notes.   HISTORY  Chief Complaint Dysuria and Altered Mental Status  Level V caveat: Limited by dementia  HPI [name] is a 68 y.o. female brought to the ED via EMS from home with a chief complaint of altered mentation and possible UTI.  Reportedly daughter told EMS that patient seemed more confused than usual and has a history of frequent UTIs.  From the records, it looks like patient has had a renal transplant previously and is on dialysis Tuesday/Thursday/Saturday.   Patient is pleasantly demented and voices no complaints of fever, chills, chest pain, shortness of breath, abdominal pain, nausea, vomiting or dysuria.   Past Medical History:  Diagnosis Date  . Anemia   . Cancer of kidney ([place])   . Dementia ([place])   . GERD (gastroesophageal reflux disease)   . Hypertension   . Renal disorder   . Uterine cancer [facility])     Patient Active Problem List   Diagnosis Date Noted  . Hypokalemia 06/01/2017  . Sepsis ([place]) 06/29/2016  . UTI (urinary tract infection) 06/29/2016  . GERD (gastroesophageal reflux disease) 06/29/2016  . Dementia ([place]) 06/29/2016  . 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 Essential hypertension 07/06/2014  . H/O kidney transplant 07/06/2014    Past Surgical History:  Procedure Laterality Date  . ABDOMINAL HYSTERECTOMY    . AV FISTULA PLACEMENT    . BREAST BIOPSY     Patient is unsure but she thinks she remember a Bx  . BREAST BIOPSY Right 2017   benign  . KIDNEY TRANSPLANT    . NEPHRECTOMY TRANSPLANTED ORGAN      Prior to Admission medications   Medication Sig Start Date End Date Taking? Authorizing Provider  atorvastatin (LIPITOR) 40 MG tablet Take 40 mg by mouth at bedtime.   Yes  [provider]  calcitRIOL (ROCALTROL) 0.25 MCG capsule Take 0.25 mcg by mouth daily.   Yes [provider]  calcium elemental as carbonate (TUMS ULTRA 1000) 400 MG chewable tablet Chew 1,000 mg by mouth daily as needed for heartburn.   Yes [provider]  citalopram (CELEXA) 20 MG tablet Take 20 mg by mouth daily.   Yes [provider]  donepezil (ARICEPT) 5 MG tablet Take 5 mg by mouth 2 (two) times daily.  04/05/16  Yes [provider]  folic acid (FOLVITE) 1 MG tablet Take 1 mg by mouth 2 (two) times daily.    Yes [provider]  furosemide (LASIX) 20 MG tablet Take 20 mg by mouth 2 (two) times daily.   Yes [provider]  metoprolol succinate (TOPROL-XL) 25 MG 24 hr tablet Take 1 tablet (25 mg total) by mouth daily. 07/02/16  Yes Gladstone Lighter, MD  mycophenolate (MYFORTIC) 180 MG EC tablet Take 180 mg by mouth 2 (two) times daily.   Yes [provider]  potassium chloride (K-DUR) 10 MEQ tablet Take 20 mEq by mouth 2 (two) times daily. 01/26/18  Yes [provider]  sirolimus (RAPAMUNE) 1 MG tablet Take 3 mg by mouth daily.    Yes [provider]    Allergies Ace inhibitors and Lisinopril  Family History  Problem Relation Age of Onset  . Stroke Mother   . Alcohol abuse Father   . Breast cancer Neg  Hx     Social History Social History   Tobacco Use  . Smoking status: Former Research scientist (life sciences)  . Smokeless tobacco: Never Used  Substance Use Topics  . Alcohol use: No  . Drug use: No    Review of Systems  Constitutional: No fever/chills Eyes: No visual changes. ENT: No sore throat. Cardiovascular: Denies chest pain. Respiratory: Denies shortness of breath. Gastrointestinal: No abdominal pain.  No nausea, no vomiting.  No diarrhea.  No constipation. Genitourinary: Negative for dysuria. Musculoskeletal: Negative for back pain. Skin: Negative for rash. Neurological: Positive for altered  mentation.  Negative for headaches, focal weakness or numbness.   ____________________________________________   PHYSICAL EXAM:  VITAL SIGNS: ED Triage Vitals  Enc Vitals Group     BP 03/22/18 2338 (!) 162/87     Pulse Rate 03/22/18 2338 77     Resp 03/22/18 2338 16     Temp 03/22/18 2338 97.9 F (36.6 C)     Temp Source 03/22/18 2338 Oral     SpO2 03/22/18 2338 99 %     Weight 03/22/18 2339 198 lb 6.6 oz (90 kg)     Height 03/22/18 2339 4\' 11"  (1.499 m)     Head Circumference --      Peak Flow --      Pain Score 03/22/18 2338 0     Pain Loc --      Pain Edu? --      Excl. in [place]? --     Constitutional: Alert and oriented. Well appearing and in no acute distress. Eyes: Conjunctivae are normal. PERRL. EOMI. Head: Atraumatic. Nose: No congestion/rhinnorhea. Mouth/Throat: Mucous membranes are moist.  Oropharynx non-erythematous. Neck: No stridor.   Cardiovascular: Normal rate, regular rhythm. Grossly normal heart sounds.  Good peripheral circulation. Respiratory: Normal respiratory effort.  No retractions. Lungs CTAB. Gastrointestinal: Soft and nontender to light or deep palpation. No distention. No abdominal bruits. No CVA tenderness. Musculoskeletal: No lower extremity tenderness nor edema.  No joint effusions. Neurologic: Alert and oriented to self only.  CN II-XII grossly intact.  Normal speech and language. No gross focal neurologic deficits are appreciated. MAEx4. Skin:  Skin is warm, dry and intact. No rash noted. Psychiatric: Mood and affect are normal. Speech and behavior are normal.  ____________________________________________   LABS (all labs ordered are listed, but only abnormal results are displayed)  Labs Reviewed  CBC WITH DIFFERENTIAL/PLATELET - Abnormal; Notable for the following components:      Result Value   RBC 3.84 (*)    Hemoglobin 9.3 (*)    HCT 30.9 (*)    MCH 24.2 (*)    All other components within normal limits  COMPREHENSIVE METABOLIC  PANEL - Abnormal; Notable for the following components:   Potassium 3.4 (*)    Glucose, Bld 159 (*)    BUN 36 (*)    Creatinine, Ser 1.43 (*)    Calcium 8.2 (*)    GFR calc non Af Amer 38 (*)    GFR calc Af Amer 44 (*)    All other components within normal limits  URINALYSIS, COMPLETE (UACMP) WITH MICROSCOPIC - Abnormal; Notable for the following components:   Color, Urine STRAW (*)    APPearance CLEAR (*)    Hgb urine dipstick SMALL (*)    Leukocytes, UA MODERATE (*)    WBC, UA >50 (*)    Bacteria, UA RARE (*)    All other components within normal limits  CULTURE, BLOOD (ROUTINE X 2)  CULTURE,  BLOOD (ROUTINE X 2)  URINE CULTURE  TROPONIN I  LACTIC ACID, PLASMA   ____________________________________________  EKG  ED ECG REPORT I, Foch Rosenwald J, the attending physician, personally viewed and interpreted this ECG.   Date: 03/23/2018  EKG Time: 0036  Rate: 62  Rhythm: normal EKG, normal sinus rhythm  Axis: Normal  Intervals:none  ST&T Change: Nonspecific  ____________________________________________  RADIOLOGY  ED MD interpretation: No ICH, no acute cardiopulmonary process  Official radiology report(s): Ct Head Wo Contrast  Result Date: 03/23/2018 CLINICAL DATA:  Possibly urinary tract infection. Dysuria. Increased confusion. Baseline dementia. EXAM: CT HEAD WITHOUT CONTRAST TECHNIQUE: Contiguous axial images were obtained from the base of the skull through the vertex without intravenous contrast. COMPARISON:  02/24/2014 FINDINGS: Brain: Mild diffuse cerebral atrophy. Ventricular dilatation consistent with central atrophy. Patchy low-attenuation changes in the deep white matter consistent with small vessel ischemia. No mass-effect or midline shift. No abnormal extra-axial fluid collections. Gray-white matter junctions are distinct. Basal cisterns are not effaced. No acute intracranial hemorrhage. Vascular: No hyperdense vessel or unexpected calcification. Skull: Calvarium  appears intact. Sinuses/Orbits: Paranasal sinuses and mastoid air cells are clear. Other: No significant change since prior study. IMPRESSION: No acute intracranial abnormalities. Chronic atrophy and small vessel ischemic changes. Electronically Signed   By: Lucienne Capers M.D.   On: 03/23/2018 00:51   Dg Chest Port 1 View  Result Date: 03/23/2018 CLINICAL DATA:  Initial evaluation for acute confusion. EXAM: PORTABLE CHEST 1 VIEW COMPARISON:  Prior radiograph from 11/21/2017 FINDINGS: Cardiomegaly, stable from previous. Mediastinal silhouette within normal limits. Aortic atherosclerosis. Lungs normally inflated. No focal infiltrates, edema, or effusion. No pneumothorax. No acute osseous abnormality.  Osteopenia noted. IMPRESSION: 1. No active cardiopulmonary disease. 2. Cardiomegaly without pulmonary edema. 3. Aortic atherosclerosis. Electronically Signed   By: Jeannine Boga M.D.   On: 03/23/2018 00:01    ____________________________________________   PROCEDURES  Procedure(s) performed: None  Procedures  Critical Care performed: Yes, see critical care note(s)   CRITICAL CARE Performed by: Paulette Blanch   Total critical care time: 30 minutes  Critical care time was exclusive of separately billable procedures and treating other patients.  Critical care was necessary to treat or prevent imminent or life-threatening deterioration.  Critical care was time spent personally by me on the following activities: development of treatment plan with patient and/or surrogate as well as nursing, discussions with consultants, evaluation of patient's response to treatment, examination of patient, obtaining history from patient or surrogate, ordering and performing treatments and interventions, ordering and review of laboratory studies, ordering and review of radiographic studies, pulse oximetry and re-evaluation of patient's condition.  ____________________________________________   INITIAL  IMPRESSION / ASSESSMENT AND PLAN / ED COURSE  As part of my medical decision making, I reviewed the following data within the [place] notes reviewed and incorporated, Labs reviewed, EKG interpreted, Old chart reviewed, Radiograph reviewed, Discussed with admitting physician and Notes from prior ED visits    68 year old female on dialysis, history of renal transplant brought for confusion; history of UTIs.  Last admission for similar presentation in October 2019.  Differential diagnosis includes but is not limited to uremia, UTI, metabolic, CVA, CAD etiologies, etc.  Will obtain screening sepsis work-up, CT head and reassess. Clinical Course as of Mar 24 107  Fri Mar 23, 2018  0106 Updated patient on lab results.  Will order IV Rocephin.  Discussed with hospitalist Dr. Jannifer Franklin who will evaluate patient in emergency department for admission for this  elderly, immunosuppressed, demented renal failure patient with altered mentation secondary to UTI.   [JS]    Clinical Course User Index [JS] Paulette Blanch, MD     ____________________________________________   FINAL CLINICAL IMPRESSION(S) / ED DIAGNOSES  Final diagnoses:  Lower urinary tract infectious disease  Altered mental status, unspecified altered mental status type  Renal transplant recipient  Stage 5 chronic kidney disease on chronic dialysis [facility])     ED Discharge Orders    None       Note:  This document was prepared using Dragon voice recognition software and may include unintentional dictation errors.    Paulette Blanch, MD 03/23/18 0600

## 2018-03-22 NOTE — ED Triage Notes (Signed)
Pt [name] from home for concern of possible UTI. Pt reports some dysuria. EMS report that the daughter called EMS because the pt was more confused than normal and has frequent UTI's. Pt does have a baseline of dementia. Afebrile. On dialysis. VSS.

## 2018-03-23 ENCOUNTER — Emergency Department: Payer: Medicare Other

## 2018-03-23 ENCOUNTER — Other Ambulatory Visit: Payer: Self-pay

## 2018-03-23 DIAGNOSIS — R410 Disorientation, unspecified: Secondary | ICD-10-CM | POA: Diagnosis present

## 2018-03-23 LAB — URINALYSIS, COMPLETE (UACMP) WITH MICROSCOPIC
Bilirubin Urine: NEGATIVE
Glucose, UA: NEGATIVE mg/dL
Ketones, ur: NEGATIVE mg/dL
Nitrite: NEGATIVE
Protein, ur: NEGATIVE mg/dL
SPECIFIC GRAVITY, URINE: 1.015 (ref 1.005–1.030)
WBC, UA: >50 WBC/hpf — ABNORMAL HIGH (ref 0–5)
pH: 6 (ref 5.0–8.0)

## 2018-03-23 LAB — COMPREHENSIVE METABOLIC PANEL
ALT: 15 U/L (ref 0–44)
AST: 20 U/L (ref 15–41)
Albumin: 3.5 g/dL (ref 3.5–5.0)
Alkaline Phosphatase: 45 U/L (ref 38–126)
Anion gap: 9 (ref 5–15)
BUN: 36 mg/dL — ABNORMAL HIGH (ref 8–23)
CO2: 22 mmol/L (ref 22–32)
Calcium: 8.2 mg/dL — ABNORMAL LOW (ref 8.9–10.3)
Chloride: 107 mmol/L (ref 98–111)
Creatinine, Ser: 1.43 mg/dL — ABNORMAL HIGH (ref 0.44–1.00)
GFR calc Af Amer: 44 mL/min — ABNORMAL LOW (ref >60)
GFR calc non Af Amer: 38 mL/min — ABNORMAL LOW (ref >60)
Glucose, Bld: 159 mg/dL — ABNORMAL HIGH (ref 70–99)
Potassium: 3.4 mmol/L — ABNORMAL LOW (ref 3.5–5.1)
Sodium: 138 mmol/L (ref 135–145)
Total Bilirubin: 0.4 mg/dL (ref 0.3–1.2)
Total Protein: 6.8 g/dL (ref 6.5–8.1)

## 2018-03-23 LAB — CBC WITH DIFFERENTIAL/PLATELET
Abs Immature Granulocytes: 0.01 10*3/uL (ref 0.00–0.07)
Basophils Absolute: 0 10*3/uL (ref 0.0–0.1)
Basophils Relative: 0 %
Eosinophils Absolute: 0 10*3/uL (ref 0.0–0.5)
Eosinophils Relative: 0 %
HCT: 30.9 % — ABNORMAL LOW (ref 36.0–46.0)
Hemoglobin: 9.3 g/dL — ABNORMAL LOW (ref 12.0–15.0)
Immature Granulocytes: 0 %
Lymphocytes Relative: 14 %
Lymphs Abs: 0.7 10*3/uL (ref 0.7–4.0)
MCH: 24.2 pg — AB (ref 26.0–34.0)
MCHC: 30.1 g/dL (ref 30.0–36.0)
MCV: 80.5 fL (ref 80.0–100.0)
Monocytes Absolute: 0.3 10*3/uL (ref 0.1–1.0)
Monocytes Relative: 7 %
NRBC: 0 % (ref 0.0–0.2)
Neutro Abs: 4 10*3/uL (ref 1.7–7.7)
Neutrophils Relative %: 79 %
Platelets: 196 10*3/uL (ref 150–400)
RBC: 3.84 MIL/uL — ABNORMAL LOW (ref 3.87–5.11)
RDW: 14.8 % (ref 11.5–15.5)
WBC: 5 10*3/uL (ref 4.0–10.5)

## 2018-03-23 LAB — TSH: TSH: 0.457 u[IU]/mL (ref 0.350–4.500)

## 2018-03-23 LAB — TROPONIN I: Troponin I: <0.03 ng/mL (ref <0.03)

## 2018-03-23 LAB — LACTIC ACID, PLASMA: Lactic Acid, Venous: 1.4 mmol/L (ref 0.5–1.9)

## 2018-03-23 MED ORDER — DOCUSATE SODIUM 100 MG PO CAPS
100.0000 mg | ORAL_CAPSULE | Freq: Two times a day (BID) | ORAL | Status: DC
  Administered 2018-03-23 – 2018-03-26 (×7): 100 mg via ORAL
  Filled 2018-03-23 (×7): qty 1

## 2018-03-23 MED ORDER — NITROGLYCERIN 2 % TD OINT
0.5000 [in_us] | TOPICAL_OINTMENT | Freq: Once | TRANSDERMAL | Status: AC
  Administered 2018-03-23: 0.5 [in_us] via TOPICAL
  Filled 2018-03-23: qty 1

## 2018-03-23 MED ORDER — MYCOPHENOLATE SODIUM 180 MG PO TBEC
180.0000 mg | DELAYED_RELEASE_TABLET | Freq: Two times a day (BID) | ORAL | Status: DC
  Administered 2018-03-23 – 2018-03-26 (×7): 180 mg via ORAL
  Filled 2018-03-23 (×8): qty 1

## 2018-03-23 MED ORDER — CITALOPRAM HYDROBROMIDE 20 MG PO TABS
20.0000 mg | ORAL_TABLET | Freq: Every day | ORAL | Status: DC
  Administered 2018-03-23 – 2018-03-26 (×4): 20 mg via ORAL
  Filled 2018-03-23 (×5): qty 1

## 2018-03-23 MED ORDER — ATORVASTATIN CALCIUM 20 MG PO TABS
40.0000 mg | ORAL_TABLET | Freq: Every day | ORAL | Status: DC
  Administered 2018-03-23 – 2018-03-25 (×3): 40 mg via ORAL
  Filled 2018-03-23 (×3): qty 2

## 2018-03-23 MED ORDER — ACETAMINOPHEN 325 MG PO TABS
650.0000 mg | ORAL_TABLET | Freq: Four times a day (QID) | ORAL | Status: DC | PRN
  Administered 2018-03-24 (×2): 650 mg via ORAL
  Filled 2018-03-23 (×2): qty 2

## 2018-03-23 MED ORDER — POTASSIUM CHLORIDE CRYS ER 20 MEQ PO TBCR
20.0000 meq | EXTENDED_RELEASE_TABLET | Freq: Two times a day (BID) | ORAL | Status: DC
  Administered 2018-03-23 – 2018-03-26 (×7): 20 meq via ORAL
  Filled 2018-03-23 (×7): qty 1

## 2018-03-23 MED ORDER — SODIUM CHLORIDE 0.9 % IV SOLN
INTRAVENOUS | Status: DC
  Administered 2018-03-23: 05:00:00 via INTRAVENOUS

## 2018-03-23 MED ORDER — METOPROLOL SUCCINATE ER 25 MG PO TB24
25.0000 mg | ORAL_TABLET | Freq: Every day | ORAL | Status: DC
  Administered 2018-03-23 – 2018-03-26 (×4): 25 mg via ORAL
  Filled 2018-03-23 (×4): qty 1

## 2018-03-23 MED ORDER — HEPARIN SODIUM (PORCINE) 5000 UNIT/ML IJ SOLN
5000.0000 [IU] | Freq: Three times a day (TID) | INTRAMUSCULAR | Status: DC
  Administered 2018-03-23 – 2018-03-26 (×9): 5000 [IU] via SUBCUTANEOUS
  Filled 2018-03-23 (×9): qty 1

## 2018-03-23 MED ORDER — FUROSEMIDE 20 MG PO TABS
20.0000 mg | ORAL_TABLET | Freq: Two times a day (BID) | ORAL | Status: DC
  Administered 2018-03-23 – 2018-03-26 (×7): 20 mg via ORAL
  Filled 2018-03-23 (×7): qty 1

## 2018-03-23 MED ORDER — SODIUM CHLORIDE 0.9 % IV SOLN
1.0000 g | Freq: Once | INTRAVENOUS | Status: AC
  Administered 2018-03-23: 1 g via INTRAVENOUS
  Filled 2018-03-23: qty 10

## 2018-03-23 MED ORDER — FOLIC ACID 1 MG PO TABS
1.0000 mg | ORAL_TABLET | Freq: Two times a day (BID) | ORAL | Status: DC
  Administered 2018-03-23 – 2018-03-26 (×7): 1 mg via ORAL
  Filled 2018-03-23 (×7): qty 1

## 2018-03-23 MED ORDER — ONDANSETRON HCL 4 MG/2ML IJ SOLN: 4.0000 mg | Freq: Four times a day (QID) | INTRAMUSCULAR | Status: DC | PRN

## 2018-03-23 MED ORDER — CALCITRIOL 0.25 MCG PO CAPS
0.2500 ug | ORAL_CAPSULE | Freq: Every day | ORAL | Status: DC
  Administered 2018-03-23 – 2018-03-26 (×4): 0.25 ug via ORAL
  Filled 2018-03-23 (×4): qty 1

## 2018-03-23 MED ORDER — SODIUM CHLORIDE 0.9 % IV SOLN
1.0000 g | INTRAVENOUS | Status: DC
  Administered 2018-03-24 – 2018-03-25 (×2): 1 g via INTRAVENOUS
  Filled 2018-03-23 (×2): qty 1
  Filled 2018-03-23: qty 10

## 2018-03-23 MED ORDER — SIROLIMUS 1 MG PO TABS: 3.0000 mg | ORAL_TABLET | Freq: Every day | ORAL | Status: DC

## 2018-03-23 MED ORDER — ONDANSETRON HCL 4 MG PO TABS: 4.0000 mg | ORAL_TABLET | Freq: Four times a day (QID) | ORAL | Status: DC | PRN

## 2018-03-23 MED ORDER — ACETAMINOPHEN 650 MG RE SUPP: 650.0000 mg | Freq: Four times a day (QID) | RECTAL | Status: DC | PRN

## 2018-03-23 MED ORDER — DONEPEZIL HCL 5 MG PO TABS
5.0000 mg | ORAL_TABLET | Freq: Two times a day (BID) | ORAL | Status: DC
  Administered 2018-03-23 – 2018-03-26 (×7): 5 mg via ORAL
  Filled 2018-03-23 (×8): qty 1

## 2018-03-23 MED ORDER — CALCIUM CARBONATE ANTACID 500 MG PO CHEW: 1000.0000 mg | CHEWABLE_TABLET | Freq: Every day | ORAL | Status: DC | PRN

## 2018-03-23 MED ORDER — ENSURE ENLIVE PO LIQD
237.0000 mL | Freq: Two times a day (BID) | ORAL | Status: DC
  Administered 2018-03-24 – 2018-03-26 (×5): 237 mL via ORAL
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
## 2018-03-23 NOTE — Care Management Obs Status (Signed)
[place] NOTIFICATION   Patient Details  Name: [name] MRN: [id number] Date of Birth: 13-Aug-1950   Medicare Observation Status Notification Given:  Yes(Telephone reivewed with daughter Ms. Loura Halt, Hildegarde Dunaway T, RN 03/23/2018, 2:53 PM

## 2018-03-23 NOTE — ED Notes (Signed)
Patient transported to CT 

## 2018-03-23 NOTE — ED Notes (Signed)
Pt transported to the floor by RN via wheelchair.

## 2018-03-23 NOTE — H&P (Signed)
[name] is an 68 y.o. female.   Chief Complaint: Altered mental status HPI: The patient with past medical history of hypertension, kidney transplant and dementia presents to the emergency department with altered mental status.  The patient had complained of dysuria at some point earlier in the day.  Urinalysis was consistent with infection.  The patient was given a dose of ceftriaxone prior to the emergency department staff calling the hospitalist service for admission.  Past Medical History:  Diagnosis Date  . Anemia   . Cancer of kidney ([place])   . Dementia ([place])   . GERD (gastroesophageal reflux disease)   . Hypertension   . Renal disorder   . Uterine cancer [facility])     Past Surgical History:  Procedure Laterality Date  . ABDOMINAL HYSTERECTOMY    . AV FISTULA PLACEMENT    . BREAST BIOPSY     Patient is unsure but she thinks she remember a Bx  . BREAST BIOPSY Right 2017   benign  . KIDNEY TRANSPLANT    . NEPHRECTOMY TRANSPLANTED ORGAN      Family History  Problem Relation Age of Onset  . Stroke Mother   . Alcohol abuse Father   . Breast cancer Neg Hx    Social History:  reports that she has quit smoking. She has never used smokeless tobacco. She reports that she does not drink alcohol or use drugs.  Allergies:  Allergies  Allergen Reactions  . Ace Inhibitors Cough  . Lisinopril Cough    Medications Prior to Admission  Medication Sig Dispense Refill  . atorvastatin (LIPITOR) 40 MG tablet Take 40 mg by mouth at bedtime.    . calcitRIOL (ROCALTROL) 0.25 MCG capsule Take 0.25 mcg by mouth daily.    . calcium elemental as carbonate (TUMS ULTRA 1000) 400 MG chewable tablet Chew 1,000 mg by mouth daily as needed for heartburn.    . citalopram (CELEXA) 20 MG tablet Take 20 mg by mouth daily.    Marland Kitchen donepezil (ARICEPT) 5 MG tablet Take 5 mg by mouth 2 (two) times daily.     . folic acid (FOLVITE) 1 MG tablet Take 1 mg by mouth 2 (two) times daily.     . furosemide  (LASIX) 20 MG tablet Take 20 mg by mouth 2 (two) times daily.    . metoprolol succinate (TOPROL-XL) 25 MG 24 hr tablet Take 1 tablet (25 mg total) by mouth daily. 30 tablet 2  . mycophenolate (MYFORTIC) 180 MG EC tablet Take 180 mg by mouth 2 (two) times daily.    . potassium chloride (K-DUR) 10 MEQ tablet Take 20 mEq by mouth 2 (two) times daily.    . sirolimus (RAPAMUNE) 1 MG tablet Take 3 mg by mouth daily.       Results for orders placed or performed during the hospital encounter of 03/22/18 (from the past 48 hour(s))  Culture, blood (routine x 2)     Status: None (Preliminary result)   Collection Time: 03/23/18 12:04 AM  Result Value Ref Range   Specimen Description BLOOD RIGHT ANTECUBITAL    Special Requests      BOTTLES DRAWN AEROBIC AND ANAEROBIC Blood Culture adequate volume   Culture      NO GROWTH < 12 HOURS Performed at [facility], [address] [place], [place] [address]    Report Status PENDING   CBC with Differential     Status: Abnormal   Collection Time: 03/23/18 12:04 AM  Result Value Ref  Range   WBC 5.0 4.0 - 10.5 K/uL    Comment: WHITE COUNT CONFIRMED ON SMEAR   RBC 3.84 (L) 3.87 - 5.11 MIL/uL   Hemoglobin 9.3 (L) 12.0 - 15.0 g/dL   HCT 30.9 (L) 36.0 - 46.0 %   MCV 80.5 80.0 - 100.0 fL   MCH 24.2 (L) 26.0 - 34.0 pg   MCHC 30.1 30.0 - 36.0 g/dL   RDW 14.8 11.5 - 15.5 %   Platelets 196 150 - 400 K/uL   nRBC 0.0 0.0 - 0.2 %   Neutrophils Relative % 79 %   Neutro Abs 4.0 1.7 - 7.7 K/uL   Lymphocytes Relative 14 %   Lymphs Abs 0.7 0.7 - 4.0 K/uL   Monocytes Relative 7 %   Monocytes Absolute 0.3 0.1 - 1.0 K/uL   Eosinophils Relative 0 %   Eosinophils Absolute 0.0 0.0 - 0.5 K/uL   Basophils Relative 0 %   Basophils Absolute 0.0 0.0 - 0.1 K/uL   WBC Morphology MORPHOLOGY UNREMARKABLE    Immature Granulocytes 0 %   Abs Immature Granulocytes 0.01 0.00 - 0.07 K/uL    Comment: Performed at [facility], [address] [place], [place]  [address]  Comprehensive metabolic panel     Status: Abnormal   Collection Time: 03/23/18 12:04 AM  Result Value Ref Range   Sodium 138 135 - 145 mmol/L   Potassium 3.4 (L) 3.5 - 5.1 mmol/L   Chloride 107 98 - 111 mmol/L   CO2 22 22 - 32 mmol/L   Glucose, Bld 159 (H) 70 - 99 mg/dL   BUN 36 (H) 8 - 23 mg/dL   Creatinine, Ser 1.43 (H) 0.44 - 1.00 mg/dL   Calcium 8.2 (L) 8.9 - 10.3 mg/dL   Total Protein 6.8 6.5 - 8.1 g/dL   Albumin 3.5 3.5 - 5.0 g/dL   AST 20 15 - 41 U/L   ALT 15 0 - 44 U/L   Alkaline Phosphatase 45 38 - 126 U/L   Total Bilirubin 0.4 0.3 - 1.2 mg/dL   GFR calc non Af Amer 38 (L) >60 mL/min   GFR calc Af Amer 44 (L) >60 mL/min   Anion gap 9 5 - 15    Comment: Performed at [facility], [address] [place], [place] [address]  Troponin I - Once     Status: None   Collection Time: 03/23/18 12:04 AM  Result Value Ref Range   Troponin I <0.03 <0.03 ng/mL    Comment: Performed at [facility], [address] [place], [place] [address]  Lactic acid, plasma     Status: None   Collection Time: 03/23/18 12:04 AM  Result Value Ref Range   Lactic Acid, Venous 1.4 0.5 - 1.9 mmol/L    Comment: Performed at [facility], [address] [place], [place] [address]  Urinalysis, Complete w Microscopic     Status: Abnormal   Collection Time: 03/23/18 12:04 AM  Result Value Ref Range   Color, Urine STRAW (A) YELLOW   APPearance CLEAR (A) CLEAR   Specific Gravity, Urine 1.015 1.005 - 1.030   pH 6.0 5.0 - 8.0   Glucose, UA NEGATIVE NEGATIVE mg/dL   Hgb urine dipstick SMALL (A) NEGATIVE   Bilirubin Urine NEGATIVE NEGATIVE   Ketones, ur NEGATIVE NEGATIVE mg/dL   Protein, ur NEGATIVE NEGATIVE mg/dL   Nitrite NEGATIVE NEGATIVE   Leukocytes, UA MODERATE (A) NEGATIVE   RBC / HPF 0-5 0 -  5 RBC/hpf   WBC, UA >50 (H) 0 - 5 WBC/hpf   Bacteria, UA RARE (A) NONE SEEN   Squamous Epithelial / LPF 0-5 0 - 5    Comment: Performed at [facility], [address] [place], [place] [address]  TSH     Status: None   Collection Time: 03/23/18 12:04 AM  Result Value Ref Range   TSH 0.457 0.350 - 4.500 uIU/mL    Comment: Performed by a 3rd Generation assay with a functional sensitivity of <=0.01 uIU/mL. Performed at [facility], [address] [place], [place] [address]   Culture, blood (routine x 2)     Status: None (Preliminary result)   Collection Time: 03/23/18 12:11 AM  Result Value Ref Range   Specimen Description BLOOD BLOOD RIGHT HAND    Special Requests      BOTTLES DRAWN AEROBIC AND ANAEROBIC Blood Culture results may not be optimal due to an excessive volume of blood received in culture bottles   Culture      NO GROWTH < 12 HOURS Performed at [facility], [address], [place], [place] [address]    Report Status PENDING    Ct Head Wo Contrast  Result Date: 03/23/2018 CLINICAL DATA:  Possibly urinary tract infection. Dysuria. Increased confusion. Baseline dementia. EXAM: CT HEAD WITHOUT CONTRAST TECHNIQUE: Contiguous axial images were obtained from the base of the skull through the vertex without intravenous contrast. COMPARISON:  02/24/2014 FINDINGS: Brain: Mild diffuse cerebral atrophy. Ventricular dilatation consistent with central atrophy. Patchy low-attenuation changes in the deep white matter consistent with small vessel ischemia. No mass-effect or midline shift. No abnormal extra-axial fluid collections. Gray-white matter junctions are distinct. Basal cisterns are not effaced. No acute intracranial hemorrhage. Vascular: No hyperdense vessel or unexpected calcification. Skull: Calvarium appears intact. Sinuses/Orbits: Paranasal sinuses and mastoid air cells are clear. Other: No significant change since prior study. IMPRESSION: No acute intracranial abnormalities. Chronic atrophy and small vessel ischemic changes. Electronically Signed   By: Lucienne Capers M.D.   On: 03/23/2018 00:51   Dg Chest Port 1  View  Result Date: 03/23/2018 CLINICAL DATA:  Initial evaluation for acute confusion. EXAM: PORTABLE CHEST 1 VIEW COMPARISON:  Prior radiograph from 11/21/2017 FINDINGS: Cardiomegaly, stable from previous. Mediastinal silhouette within normal limits. Aortic atherosclerosis. Lungs normally inflated. No focal infiltrates, edema, or effusion. No pneumothorax. No acute osseous abnormality.  Osteopenia noted. IMPRESSION: 1. No active cardiopulmonary disease. 2. Cardiomegaly without pulmonary edema. 3. Aortic atherosclerosis. Electronically Signed   By: Jeannine Boga M.D.   On: 03/23/2018 00:01    Review of Systems  Unable to perform ROS: Mental status change    Blood pressure (!) 162/75, pulse 66, temperature (!) 97.5 F (36.4 C), temperature source Oral, resp. rate 18, height 4\' 11"  (1.499 m), weight 92.5 kg, SpO2 100 %. Physical Exam  Vitals reviewed. Constitutional: She is oriented to person, place, and time. She appears well-developed and well-nourished. No distress.  HENT:  Head: Normocephalic and atraumatic.  Mouth/Throat: Oropharynx is clear and moist.  Eyes: Pupils are equal, round, and reactive to light. Conjunctivae and EOM are normal. No scleral icterus.  Neck: Normal range of motion. Neck supple. No JVD present. No tracheal deviation present. No thyromegaly present.  Cardiovascular: Normal rate, regular rhythm and normal heart sounds. Exam reveals no gallop and no friction rub.  No murmur heard. Respiratory: Effort normal and breath sounds normal.  GI: Soft. Bowel sounds are normal. She exhibits no distension.  There is no abdominal tenderness.  Genitourinary:    Genitourinary Comments: Deferred   Musculoskeletal: Normal range of motion.        General: No edema.  Lymphadenopathy:    She has no cervical adenopathy.  Neurological: She is alert and oriented to person, place, and time. No cranial nerve deficit. She exhibits normal muscle tone.  Skin: Skin is warm and dry. No  rash noted. No erythema.  Psychiatric: She has a normal mood and affect. Her behavior is normal.  Patient is confused     Assessment/Plan This is a 68 year old female admitted for confusion. 1.  Altered mental status: Confusion; likely related to UTI.  Continue to monitor for improvement in mental status although it is difficult to know given her baseline dementia. 2.  UTI: Present on admission.  Continue ceftriaxone 3.  Hypertension: Uncontrolled; continue metoprolol.  Consider hydralazine as needed. 4.  CKD: Stage III; avoid nephrotoxic agents.  Hydrate with intravenous fluid. 5.  Renal transplant: Baseline creatinine 1.2-1.7 per nephrology notes from [facility].  No evidence of rejection at this time.  Continue immunosuppressants.  Also continue Lasix 6.  Dementia: Continue donepezil and Celexa 7.  Hyperlipidemia: Continue statin therapy 8.  DVT prophylaxis: Heparin 9.  GI prophylaxis: None The patient is a full code.  Time spent on admission orders and patient care approximately 45 minutes  Harrie Foreman, MD 03/23/2018, 9:29 AM

## 2018-03-23 NOTE — Progress Notes (Addendum)
Admitted this morning for altered mental status secondary to UTI.  On IV Rocephin, now this afternoon alert, awake, oriented states that she is not on dialysis anymore.  Previous blood work, antibiotics and further medicines. Kidney transplant recipient: Continue immunosuppressants, continue gentle hydration for today.

## 2018-03-23 NOTE — Care Management (Signed)
Patient admitted from home with AMS due to UTI.  Patient with hx dementia.  Assessment completed via phone with daughter [name].  Patient lives at home with daughter.  Patient has Medicaid PCS services [facility] for 3 hours a day.  Patient's ex husband lives locally and provides transportation to all appointments. PCP [facility] at [facility].  Pharmacy [facility] . Denies issues obtaining medications or with transportation.  Patient has BSC, shower seat, and RW in the home.  Has used  [facility] in the past.  Per daughter does not feel like the patient will require home health RN or PT at discharge.

## 2018-03-24 ENCOUNTER — Other Ambulatory Visit: Payer: Self-pay

## 2018-03-24 MED ORDER — SODIUM CHLORIDE 0.9 % IV SOLN
INTRAVENOUS | Status: DC | PRN
  Administered 2018-03-25 (×2): 250 mL via INTRAVENOUS

## 2018-03-24 NOTE — Progress Notes (Signed)
[facility]  ROUNDING NOTE   Subjective:  Patient known to [place] from the office. She has history of end-stage renal disease status post renal transplant. Graft function has been stable. Presents with worsening confusion. Urinalysis shows moderate leukocytes with greater than 50 WBCs per high-power field.   Objective:  Vital signs in last 24 hours:  Temp:  [97.4 F (36.3 C)-98.3 F (36.8 C)] 97.4 F (36.3 C) (02/08 1200) Pulse Rate:  [65-75] 65 (02/08 1200) Resp:  [16-20] 16 (02/08 1200) BP: (133-150)/(62-89) 133/62 (02/08 1200) SpO2:  [98 %-100 %] 100 % (02/08 1200) Weight:  [93.8 kg] 93.8 kg (02/08 0439)  Weight change: 3.8 kg Filed Weights   03/22/18 2339 03/23/18 0500 03/24/18 0439  Weight: 90 kg 92.5 kg 93.8 kg    Intake/Output: I/O last 3 completed shifts: In: 1166.9 [I.V.:965.9; IV Piggyback:200.9] Out: 0    Intake/Output this shift:  No intake/output data recorded.  Physical Exam: General: No acute distress  Head: Normocephalic, atraumatic. Moist oral mucosal membranes  Eyes: Anicteric  Neck: Supple, trachea midline  Lungs:  Clear to auscultation, normal effort  Heart: S1S2 no rubs  Abdomen:  Soft, nontender, bowel sounds present  Extremities: No peripheral edema.  Neurologic: Awake, but confused  Skin: No lesions       Basic Metabolic Panel: Recent Labs  Lab 03/23/18 0004  NA 138  K 3.4*  CL 107  CO2 22  GLUCOSE 159*  BUN 36*  CREATININE 1.43*  CALCIUM 8.2*    Liver Function Tests: Recent Labs  Lab 03/23/18 0004  AST 20  ALT 15  ALKPHOS 45  BILITOT 0.4  PROT 6.8  ALBUMIN 3.5   No results for input(s): LIPASE, AMYLASE in the last 168 hours. No results for input(s): AMMONIA in the last 168 hours.  CBC: Recent Labs  Lab 03/23/18 0004  WBC 5.0  NEUTROABS 4.0  HGB 9.3*  HCT 30.9*  MCV 80.5  PLT 196    Cardiac Enzymes: Recent Labs  Lab 03/23/18 0004  TROPONINI <0.03    BNP: Invalid input(s):  POCBNP  CBG: No results for input(s): GLUCAP in the last 168 hours.  Microbiology: Results for orders placed or performed during the hospital encounter of 03/22/18  Culture, blood (routine x 2)     Status: None (Preliminary result)   Collection Time: 03/23/18 12:04 AM  Result Value Ref Range Status   Specimen Description BLOOD RIGHT ANTECUBITAL  Final   Special Requests   Final    BOTTLES DRAWN AEROBIC AND ANAEROBIC Blood Culture adequate volume   Culture   Final    NO GROWTH 1 DAY Performed at [facility], [address], [place], [place] [address]    Report Status PENDING  Incomplete  Urine culture     Status: Abnormal (Preliminary result)   Collection Time: 03/23/18 12:04 AM  Result Value Ref Range Status   Specimen Description URINE, RANDOM  Final   Special Requests   Final    NONE Performed at [facility], [address], [place], [place] [address]    Culture >=100,000 COLONIES/mL UNIDENTIFIED ORGANISM (A)  Final   Report Status PENDING  Incomplete  Culture, blood (routine x 2)     Status: None (Preliminary result)   Collection Time: 03/23/18 12:11 AM  Result Value Ref Range Status   Specimen Description BLOOD BLOOD RIGHT HAND  Final   Special Requests   Final    BOTTLES DRAWN AEROBIC AND ANAEROBIC Blood Culture results  may not be optimal due to an excessive volume of blood received in culture bottles   Culture   Final    NO GROWTH 1 DAY Performed at [facility], [address] [place], [place] [address]    Report Status PENDING  Incomplete    Coagulation Studies: No results for input(s): LABPROT, INR in the last 72 hours.  Urinalysis: Recent Labs    03/23/18 0004  COLORURINE STRAW*  LABSPEC 1.015  PHURINE 6.0  GLUCOSEU NEGATIVE  HGBUR SMALL*  BILIRUBINUR NEGATIVE  KETONESUR NEGATIVE  PROTEINUR NEGATIVE  NITRITE NEGATIVE  LEUKOCYTESUR MODERATE*      Imaging: Ct Head Wo Contrast  Result Date: 03/23/2018 CLINICAL  DATA:  Possibly urinary tract infection. Dysuria. Increased confusion. Baseline dementia. EXAM: CT HEAD WITHOUT CONTRAST TECHNIQUE: Contiguous axial images were obtained from the base of the skull through the vertex without intravenous contrast. COMPARISON:  02/24/2014 FINDINGS: Brain: Mild diffuse cerebral atrophy. Ventricular dilatation consistent with central atrophy. Patchy low-attenuation changes in the deep white matter consistent with small vessel ischemia. No mass-effect or midline shift. No abnormal extra-axial fluid collections. Gray-white matter junctions are distinct. Basal cisterns are not effaced. No acute intracranial hemorrhage. Vascular: No hyperdense vessel or unexpected calcification. Skull: Calvarium appears intact. Sinuses/Orbits: Paranasal sinuses and mastoid air cells are clear. Other: No significant change since prior study. IMPRESSION: No acute intracranial abnormalities. Chronic atrophy and small vessel ischemic changes. Electronically Signed   By: Lucienne Capers M.D.   On: 03/23/2018 00:51   Dg Chest Port 1 View  Result Date: 03/23/2018 CLINICAL DATA:  Initial evaluation for acute confusion. EXAM: PORTABLE CHEST 1 VIEW COMPARISON:  Prior radiograph from 11/21/2017 FINDINGS: Cardiomegaly, stable from previous. Mediastinal silhouette within normal limits. Aortic atherosclerosis. Lungs normally inflated. No focal infiltrates, edema, or effusion. No pneumothorax. No acute osseous abnormality.  Osteopenia noted. IMPRESSION: 1. No active cardiopulmonary disease. 2. Cardiomegaly without pulmonary edema. 3. Aortic atherosclerosis. Electronically Signed   By: Jeannine Boga M.D.   On: 03/23/2018 00:01     Medications:   . sodium chloride    . cefTRIAXone (ROCEPHIN)  IV Stopped (03/24/18 0041)   . atorvastatin  40 mg Oral QHS  . calcitRIOL  0.25 mcg Oral Daily  . citalopram  20 mg Oral Daily  . docusate sodium  100 mg Oral BID  . donepezil  5 mg Oral BID  . feeding  supplement (ENSURE ENLIVE)  237 mL Oral BID BM  . folic acid  1 mg Oral BID  . furosemide  20 mg Oral BID  . heparin  5,000 Units Subcutaneous Q8H  . metoprolol succinate  25 mg Oral Daily  . mycophenolate  180 mg Oral BID  . potassium chloride SA  20 mEq Oral BID  . sirolimus  3 mg Oral Daily   sodium chloride, acetaminophen **OR** acetaminophen, calcium carbonate, ondansetron **OR** ondansetron (ZOFRAN) IV  Assessment/ Plan:  68 y.o. female  with renal transplant 2009 on sirolimus, mycophenolate, prednisone  1.  Chronic kidney disease stage III status post renal transplantation.  Patient to be maintained on seromatous, mycophenolate, and prednisone at the current doses.  Renal graft function appears to be stable.  2.  Urinary tract infection.  100,000 colony-forming units of unidentified organism noted.  Continue ceftriaxone at this time.  3.  Secondary hyperparathyroidism.  Maintain the patient on Calcitrol 0.25 mcg p.o. daily and continue to periodically monitor bone mineral metabolism parameters.  4.  Hypertension.  Maintain the patient on metoprolol  25 mg daily.  LOS: 0 Adelai Achey 2/8/202012:45 PM

## 2018-03-24 NOTE — Progress Notes (Signed)
[place] [facility] [place] NAME: [name]    MR#:  [id number]  DATE OF BIRTH:  28-Jun-1950  SUBJECTIVE:  CHIEF COMPLAINT:   Chief Complaint  Patient presents with  . Dysuria  . Altered Mental Status   No new complaints.  Patient sitting up in chair.  Having breakfast.  Pleasantly confused due to underlying dementia.  REVIEW OF SYSTEMS:  ROS  Unobtainable due to underlying dementia.  DRUG ALLERGIES:   Allergies  Allergen Reactions  . Ace Inhibitors Cough  . Lisinopril Cough   VITALS:  Blood pressure 133/62, pulse 65, temperature (!) 97.4 F (36.3 C), temperature source Oral, resp. rate 16, height 4\' 11"  (1.499 m), weight 93.8 kg, SpO2 100 %. PHYSICAL EXAMINATION:     Physical Exam  Constitutional: She is well-developed, well-nourished, and in no distress.  HENT:  Head: Normocephalic and atraumatic.  Eyes: Pupils are equal, round, and reactive to light. EOM are normal.  Neck: Normal range of motion. Neck supple.  Cardiovascular: Normal rate, regular rhythm and normal heart sounds.  Pulmonary/Chest: Effort normal and breath sounds normal. No respiratory distress.  Abdominal: Soft. Bowel sounds are normal. She exhibits no distension. There is no abdominal tenderness.  Musculoskeletal: Normal range of motion.  Neurological: She is alert.  Pleasantly confused due to underlying dementia  Skin: Skin is warm. She is not diaphoretic.  Psychiatric: Affect normal.   LABORATORY PANEL:  Female CBC Recent Labs  Lab 03/23/18 0004  WBC 5.0  HGB 9.3*  HCT 30.9*  PLT 196   ------------------------------------------------------------------------------------------------------------------ Chemistries  Recent Labs  Lab 03/23/18 0004  NA 138  K 3.4*  CL 107  CO2 22  GLUCOSE 159*  BUN 36*  CREATININE 1.43*  CALCIUM 8.2*  AST 20  ALT 15  ALKPHOS 45  BILITOT 0.4   RADIOLOGY:  No results found. ASSESSMENT AND PLAN:   This  is a 68 year old female admitted for confusion. 1.  Acute metabolic encephalopathy Secondary to urinary tract infection in a patient with underlying dementia Appears to be improving with ongoing treatment of UTI.  2.  UTI: Present on admission.   Urine culture growing greater than 100,000 colonies of unidentified organism.  Continue IV Rocephin pending results of urine culture and sensitivities   3.  Hypertension: Blood pressure better controlled on current regimen. continue metoprolol.  Consider hydralazine as needed.  4.  CKD: Stage III; avoid nephrotoxic agents.  Hydrate with intravenous fluid. Renal function remains fairly stable. Nephrology service following.  5.  Renal transplant: Baseline creatinine 1.2-1.7 per nephrology notes from [facility].  No evidence of rejection at this time.  Continue immunosuppressants.    6.  Dementia: Continue donepezil and Celexa  7.  Hyperlipidemia: Continue statin therapy   DVT prophylaxis: Heparin   All the records are reviewed and case discussed with Care Management/Social Worker. Management plans discussed with the patient, family and they are in agreement.  CODE STATUS: Full Code  TOTAL TIME TAKING CARE OF THIS PATIENT: 36 minutes.  Called patient's daughter listed in the chart Ms. [name] for update but no response.  Left voice message.  More than 50% of the time was spent in counseling/coordination of care: YES  POSSIBLE D/C IN 2 DAYS, DEPENDING ON CLINICAL CONDITION.   Etola Mull M.D on 03/24/2018 at 1:57 PM  Between 7am to 6pm - Pager - [phone]  After 6pm go to [email or web address] - [employer] [facility]  Office  [phone]  CC: Primary care physician; Center, [facility]  Note: This dictation was prepared with Dragon dictation along with smaller phrase technology. Any transcriptional errors that result from this process are unintentional.

## 2018-03-25 DIAGNOSIS — N390 Urinary tract infection, site not specified: Secondary | ICD-10-CM | POA: Diagnosis present

## 2018-03-25 DIAGNOSIS — F0390 Unspecified dementia without behavioral disturbance: Secondary | ICD-10-CM | POA: Diagnosis present

## 2018-03-25 DIAGNOSIS — Z905 Acquired absence of kidney: Secondary | ICD-10-CM | POA: Diagnosis not present

## 2018-03-25 DIAGNOSIS — G9341 Metabolic encephalopathy: Secondary | ICD-10-CM | POA: Diagnosis present

## 2018-03-25 DIAGNOSIS — Z823 Family history of stroke: Secondary | ICD-10-CM | POA: Diagnosis not present

## 2018-03-25 DIAGNOSIS — Z85528 Personal history of other malignant neoplasm of kidney: Secondary | ICD-10-CM | POA: Diagnosis not present

## 2018-03-25 DIAGNOSIS — Z90710 Acquired absence of both cervix and uterus: Secondary | ICD-10-CM | POA: Diagnosis not present

## 2018-03-25 DIAGNOSIS — Z940 Kidney transplant status: Secondary | ICD-10-CM | POA: Diagnosis not present

## 2018-03-25 DIAGNOSIS — I129 Hypertensive chronic kidney disease with stage 1 through stage 4 chronic kidney disease, or unspecified chronic kidney disease: Secondary | ICD-10-CM | POA: Diagnosis present

## 2018-03-25 DIAGNOSIS — N2581 Secondary hyperparathyroidism of renal origin: Secondary | ICD-10-CM | POA: Diagnosis present

## 2018-03-25 DIAGNOSIS — Z811 Family history of alcohol abuse and dependence: Secondary | ICD-10-CM | POA: Diagnosis not present

## 2018-03-25 DIAGNOSIS — K219 Gastro-esophageal reflux disease without esophagitis: Secondary | ICD-10-CM | POA: Diagnosis present

## 2018-03-25 DIAGNOSIS — B957 Other staphylococcus as the cause of diseases classified elsewhere: Secondary | ICD-10-CM | POA: Diagnosis present

## 2018-03-25 DIAGNOSIS — E785 Hyperlipidemia, unspecified: Secondary | ICD-10-CM | POA: Diagnosis present

## 2018-03-25 DIAGNOSIS — Z8542 Personal history of malignant neoplasm of other parts of uterus: Secondary | ICD-10-CM | POA: Diagnosis not present

## 2018-03-25 DIAGNOSIS — R4182 Altered mental status, unspecified: Secondary | ICD-10-CM | POA: Diagnosis present

## 2018-03-25 DIAGNOSIS — Z79899 Other long term (current) drug therapy: Secondary | ICD-10-CM | POA: Diagnosis not present

## 2018-03-25 DIAGNOSIS — Z7952 Long term (current) use of systemic steroids: Secondary | ICD-10-CM | POA: Diagnosis not present

## 2018-03-25 DIAGNOSIS — Z87891 Personal history of nicotine dependence: Secondary | ICD-10-CM | POA: Diagnosis not present

## 2018-03-25 DIAGNOSIS — Z888 Allergy status to other drugs, medicaments and biological substances status: Secondary | ICD-10-CM | POA: Diagnosis not present

## 2018-03-25 DIAGNOSIS — N183 Chronic kidney disease, stage 3 (moderate): Secondary | ICD-10-CM | POA: Diagnosis present

## 2018-03-25 LAB — BASIC METABOLIC PANEL
Anion gap: 5 (ref 5–15)
BUN: 38 mg/dL — ABNORMAL HIGH (ref 8–23)
CHLORIDE: 109 mmol/L (ref 98–111)
CO2: 26 mmol/L (ref 22–32)
Calcium: 8.4 mg/dL — ABNORMAL LOW (ref 8.9–10.3)
Creatinine, Ser: 1.39 mg/dL — ABNORMAL HIGH (ref 0.44–1.00)
GFR calc Af Amer: 45 mL/min — ABNORMAL LOW (ref >60)
GFR calc non Af Amer: 39 mL/min — ABNORMAL LOW (ref >60)
Glucose, Bld: 108 mg/dL — ABNORMAL HIGH (ref 70–99)
Potassium: 4.2 mmol/L (ref 3.5–5.1)
Sodium: 140 mmol/L (ref 135–145)

## 2018-03-25 LAB — CBC
HEMATOCRIT: 34.3 % — AB (ref 36.0–46.0)
Hemoglobin: 10.4 g/dL — ABNORMAL LOW (ref 12.0–15.0)
MCH: 24.6 pg — ABNORMAL LOW (ref 26.0–34.0)
MCHC: 30.3 g/dL (ref 30.0–36.0)
MCV: 81.3 fL (ref 80.0–100.0)
Platelets: 216 10*3/uL (ref 150–400)
RBC: 4.22 MIL/uL (ref 3.87–5.11)
RDW: 15 % (ref 11.5–15.5)
WBC: 5 10*3/uL (ref 4.0–10.5)
nRBC: 0 % (ref 0.0–0.2)

## 2018-03-25 LAB — URINE CULTURE: Culture: >=100000 — AB

## 2018-03-25 LAB — MAGNESIUM: Magnesium: 2.3 mg/dL (ref 1.7–2.4)

## 2018-03-25 MED ORDER — DIPHENHYDRAMINE HCL 25 MG PO CAPS
25.0000 mg | ORAL_CAPSULE | Freq: Four times a day (QID) | ORAL | Status: DC | PRN
  Administered 2018-03-25: 25 mg via ORAL
  Filled 2018-03-25: qty 1

## 2018-03-25 MED ORDER — CIPROFLOXACIN IN D5W 400 MG/200ML IV SOLN
400.0000 mg | Freq: Two times a day (BID) | INTRAVENOUS | Status: DC
  Administered 2018-03-25 (×2): 400 mg via INTRAVENOUS
  Filled 2018-03-25 (×4): qty 200

## 2018-03-25 NOTE — Progress Notes (Signed)
[facility]  ROUNDING NOTE   Subjective:  Renal graft function appears stable. Creatinine currently 1.39. EGFR currently 39.   Objective:  Vital signs in last 24 hours:  Temp:  [97.4 F (36.3 C)-97.8 F (36.6 C)] 97.6 F (36.4 C) (02/09 1139) Pulse Rate:  [58-72] 63 (02/09 1139) Resp:  [15-20] 15 (02/09 1139) BP: (104-183)/(58-76) 104/58 (02/09 1139) SpO2:  [96 %-100 %] 100 % (02/09 1139) Weight:  [93.2 kg] 93.2 kg (02/09 0618)  Weight change: -0.6 kg Filed Weights   03/23/18 0500 03/24/18 0439 03/25/18 0618  Weight: 92.5 kg 93.8 kg 93.2 kg    Intake/Output: I/O last 3 completed shifts: In: 203.6 [I.V.:2.7; IV Piggyback:200.9] Out: 200 [Urine:200]   Intake/Output this shift:  Total I/O In: 240 [P.O.:240] Out: -   Physical Exam: General: No acute distress  Head: Normocephalic, atraumatic. Moist oral mucosal membranes  Eyes: Anicteric  Neck: Supple, trachea midline  Lungs:  Clear to auscultation, normal effort  Heart: S1S2 no rubs  Abdomen:  Soft, nontender, bowel sounds present  Extremities: No peripheral edema.  Neurologic: Awake, but confused  Skin: No lesions       Basic Metabolic Panel: Recent Labs  Lab 03/23/18 0004 03/25/18 0622  NA 138 140  K 3.4* 4.2  CL 107 109  CO2 22 26  GLUCOSE 159* 108*  BUN 36* 38*  CREATININE 1.43* 1.39*  CALCIUM 8.2* 8.4*  MG  --  2.3    Liver Function Tests: Recent Labs  Lab 03/23/18 0004  AST 20  ALT 15  ALKPHOS 45  BILITOT 0.4  PROT 6.8  ALBUMIN 3.5   No results for input(s): LIPASE, AMYLASE in the last 168 hours. No results for input(s): AMMONIA in the last 168 hours.  CBC: Recent Labs  Lab 03/23/18 0004 03/25/18 0622  WBC 5.0 5.0  NEUTROABS 4.0  --   HGB 9.3* 10.4*  HCT 30.9* 34.3*  MCV 80.5 81.3  PLT 196 216    Cardiac Enzymes: Recent Labs  Lab 03/23/18 0004  TROPONINI <0.03    BNP: Invalid input(s): POCBNP  CBG: No results for input(s): GLUCAP in the last 168  hours.  Microbiology: Results for orders placed or performed during the hospital encounter of 03/22/18  Culture, blood (routine x 2)     Status: None (Preliminary result)   Collection Time: 03/23/18 12:04 AM  Result Value Ref Range Status   Specimen Description BLOOD RIGHT ANTECUBITAL  Final   Special Requests   Final    BOTTLES DRAWN AEROBIC AND ANAEROBIC Blood Culture adequate volume   Culture   Final    NO GROWTH 2 DAYS Performed at [facility], [address] [place], [place] [address]    Report Status PENDING  Incomplete  Urine culture     Status: Abnormal   Collection Time: 03/23/18 12:04 AM  Result Value Ref Range Status   Specimen Description URINE, RANDOM  Final   Special Requests   Final    NONE Performed at [facility], [address] [place], [place] [address]    Culture >=100,000 COLONIES/mL STAPHYLOCOCCUS LUGDUNENSIS (A)  Final   Report Status 03/25/2018 FINAL  Final   Organism ID, Bacteria STAPHYLOCOCCUS LUGDUNENSIS (A)  Final      Susceptibility   Staphylococcus lugdunensis - MIC*    CIPROFLOXACIN <=0.5 SENSITIVE Sensitive     GENTAMICIN <=0.5 SENSITIVE Sensitive     NITROFURANTOIN <=16 SENSITIVE Sensitive     OXACILLIN >=4 RESISTANT Resistant  TETRACYCLINE <=1 SENSITIVE Sensitive     VANCOMYCIN <=0.5 SENSITIVE Sensitive     TRIMETH/SULFA <=10 SENSITIVE Sensitive     CLINDAMYCIN <=0.25 SENSITIVE Sensitive     RIFAMPIN <=0.5 SENSITIVE Sensitive     Inducible Clindamycin NEGATIVE Sensitive     * >=100,000 COLONIES/mL STAPHYLOCOCCUS LUGDUNENSIS  Culture, blood (routine x 2)     Status: None (Preliminary result)   Collection Time: 03/23/18 12:11 AM  Result Value Ref Range Status   Specimen Description BLOOD BLOOD RIGHT HAND  Final   Special Requests   Final    BOTTLES DRAWN AEROBIC AND ANAEROBIC Blood Culture results may not be optimal due to an excessive volume of blood received in culture bottles   Culture   Final    NO GROWTH 2  DAYS Performed at [facility], [address] [place], [place] [address]    Report Status PENDING  Incomplete    Coagulation Studies: No results for input(s): LABPROT, INR in the last 72 hours.  Urinalysis: Recent Labs    03/23/18 0004  COLORURINE STRAW*  LABSPEC 1.015  PHURINE 6.0  GLUCOSEU NEGATIVE  HGBUR SMALL*  BILIRUBINUR NEGATIVE  KETONESUR NEGATIVE  PROTEINUR NEGATIVE  NITRITE NEGATIVE  LEUKOCYTESUR MODERATE*      Imaging: No results found.   Medications:   . sodium chloride Stopped (03/25/18 0926)  . ciprofloxacin 400 mg (03/25/18 1327)   . atorvastatin  40 mg Oral QHS  . calcitRIOL  0.25 mcg Oral Daily  . citalopram  20 mg Oral Daily  . docusate sodium  100 mg Oral BID  . donepezil  5 mg Oral BID  . feeding supplement (ENSURE ENLIVE)  237 mL Oral BID BM  . folic acid  1 mg Oral BID  . furosemide  20 mg Oral BID  . heparin  5,000 Units Subcutaneous Q8H  . metoprolol succinate  25 mg Oral Daily  . mycophenolate  180 mg Oral BID  . potassium chloride SA  20 mEq Oral BID  . sirolimus  3 mg Oral Daily   sodium chloride, acetaminophen **OR** acetaminophen, calcium carbonate, ondansetron **OR** ondansetron (ZOFRAN) IV  Assessment/ Plan:  68 y.o. female  with renal transplant 2009 on sirolimus, mycophenolate, prednisone  1.  Chronic kidney disease stage III status post renal transplantation.  Renal graft function remains stable.  Continue sirolimus, mycophenolate, and prednisone.  2.  Urinary tract infection.  Klebsiella growing in the urine.  Patient currently on ciprofloxacin.  3.  Secondary hyperparathyroidism.  Continue calcitriol 0.25 mcg p.o. daily.  4.  Hypertension.  Maintain the patient on metoprolol 25 mg daily.  LOS: 0 Amed Datta 2/9/20201:57 PM

## 2018-03-25 NOTE — Progress Notes (Signed)
[facility] [place] NAME: [name]    MR#:  [id number]  DATE OF BIRTH:  06-28-1950  SUBJECTIVE:  CHIEF COMPLAINT:   Chief Complaint  Patient presents with  . Dysuria  . Altered Mental Status   No new complaints.  Patient sitting up in chair.  Having breakfast.  REVIEW OF SYSTEMS:  ROS  Unobtainable due to underlying dementia.  DRUG ALLERGIES:   Allergies  Allergen Reactions  . Ace Inhibitors Cough  . Lisinopril Cough   VITALS:  Blood pressure (!) 104/58, pulse 63, temperature 97.6 F (36.4 C), temperature source Oral, resp. rate 15, height 4\' 11"  (1.499 m), weight 93.2 kg, SpO2 100 %. PHYSICAL EXAMINATION:     Physical Exam  Constitutional: She is well-developed, well-nourished, and in no distress.  HENT:  Head: Normocephalic and atraumatic.  Eyes: Pupils are equal, round, and reactive to light. EOM are normal.  Neck: Normal range of motion. Neck supple.  Cardiovascular: Normal rate, regular rhythm and normal heart sounds.  Pulmonary/Chest: Effort normal and breath sounds normal. No respiratory distress.  Abdominal: Soft. Bowel sounds are normal. She exhibits no distension. There is no abdominal tenderness.  Musculoskeletal: Normal range of motion.  Neurological: She is alert.  Pleasantly confused due to underlying dementia  Skin: Skin is warm. She is not diaphoretic.  Psychiatric: Affect normal.   LABORATORY PANEL:  Female CBC Recent Labs  Lab 03/25/18 0622  WBC 5.0  HGB 10.4*  HCT 34.3*  PLT 216   ------------------------------------------------------------------------------------------------------------------ Chemistries  Recent Labs  Lab 03/23/18 0004 03/25/18 0622  NA 138 140  K 3.4* 4.2  CL 107 109  CO2 22 26  GLUCOSE 159* 108*  BUN 36* 38*  CREATININE 1.43* 1.39*  CALCIUM 8.2* 8.4*  MG  --  2.3  AST 20  --   ALT 15  --   ALKPHOS 45  --   BILITOT 0.4  --    RADIOLOGY:  No results  found. ASSESSMENT AND PLAN:   This is a 68 year old female admitted for confusion. 1.  Acute metabolic encephalopathy Secondary to urinary tract infection in a patient with underlying dementia Continues to improve with ongoing treatment of UTI  2.  UTI: Present on admission.   Urine culture growing Staphylococcus Lugdunensis sensitive to ciprofloxacin.  Rocephin discontinued and patient started on IV ciprofloxacin.  Plans to discharge on p.o. ciprofloxacin to complete treatment duration possibly in a.m.   3.  Hypertension: Blood pressure better controlled on current regimen. continue metoprolol.   4.  CKD: Stage III; avoid nephrotoxic agents.  Hydrate with intravenous fluid. Renal function improved and remains fairly stable. Nephrology service following.  5.  Renal transplant: Baseline creatinine 1.2-1.7 per nephrology notes from [facility].  No evidence of rejection at this time.  Continue immunosuppressants.    6.  Dementia: Continue donepezil and Celexa  7.  Hyperlipidemia: Continue statin therapy   DVT prophylaxis: Heparin   All the records are reviewed and case discussed with Care Management/Social Worker. Management plans discussed with the patient, family and they are in agreement. Change from observation status to inpatient status.  CODE STATUS: Full Code  TOTAL TIME TAKING CARE OF THIS PATIENT: 25 minutes.  Called patient's daughter listed in the chart Ms. [name] recently for update but no response.  Left voice message.  More than 50% of the time was spent in counseling/coordination of care: YES  POSSIBLE D/C IN 1DAYS, DEPENDING ON CLINICAL CONDITION.  Melania Kirks M.D on 03/25/2018 at 12:07 PM  Between 7am to 6pm - Pager - [phone]  After 6pm go to [email or web address] - Technical [employer]wer [facility]  Office  [phone]  CC: Primary care physician; Center, [facility]  Note: This dictation was prepared with Dragon dictation  along with smaller phrase technology. Any transcriptional errors that result from this process are unintentional.

## 2018-03-25 NOTE — Progress Notes (Signed)
Spoke with patient's daughter [name] re: need patient's medication, Sirolimus, due to our Pharmacy not carrying it. Patient verbalized her understanding.

## 2018-03-25 NOTE — Progress Notes (Signed)
Spoke with patient's daughter [name] again re: updating on her Mother's plan of care and that we need her Sirolimus medication. Daughter verbalized her understanding (she was calling from work).

## 2018-03-26 MED ORDER — DOXYCYCLINE HYCLATE 100 MG PO CAPS: 100.0000 mg | ORAL_CAPSULE | Freq: Two times a day (BID) | ORAL | 0 refills | Status: AC

## 2018-03-26 MED ORDER — CLINDAMYCIN HCL 300 MG PO CAPS: 300.0000 mg | ORAL_CAPSULE | Freq: Three times a day (TID) | ORAL | 0 refills | Status: DC

## 2018-03-26 NOTE — Discharge Summary (Addendum)
[facility] [place] NAME: [name]    MR#:  [id number]  DATE OF BIRTH:  Aug 31, 1950  DATE OF ADMISSION:  03/22/2018   ADMITTING PHYSICIAN: Harrie Foreman, MD  DATE OF DISCHARGE:  03/26/2018  PRIMARY CARE PHYSICIAN: Center, [facility]   ADMISSION DIAGNOSIS:  Lower urinary tract infectious disease [N39.0] Altered mental status, unspecified altered mental status type [R41.82] Renal transplant recipient [Z94.0] Stage 5 chronic kidney disease on chronic dialysis ([place]) [N18.6, Z99.2] DISCHARGE DIAGNOSIS:  Active Problems:   UTI (urinary tract infection)   Confusion  SECONDARY DIAGNOSIS:   Past Medical History:  Diagnosis Date  . Anemia   . Cancer of kidney ([place])   . Dementia ([place])   . GERD (gastroesophageal reflux disease)   . Hypertension   . Renal disorder   . Uterine cancer [facility])    HOSPITAL COURSE:   Chief Complaint: Altered mental status  HPI: The patient with past medical history of hypertension, kidney transplant and dementia presented to the emergency department with altered mental status.  The patient had complained of dysuria at some point earlier in the day.  Urinalysis was consistent with infection.  The patient was given a dose of ceftriaxone prior to the emergency department staff calling the hospitalist service for admission.  Please refer to the H&P dictated for further details  HOSPITAL COURSE; 1.  Acute metabolic encephalopathy Secondary to urinary tract infection in a patient with underlying dementia.  Significantly improved clinically.  Patient appears back to baseline with treatment of urinary tract infection.  Follow-up with primary care physician.  2. UTI: Present on admission.  Urine culture growing Staphylococcus Lugdunensis sensitive to ciprofloxacin.  Rocephin previously discontinued and patient started on IV ciprofloxacin.    Patient clinically stable.  Based on urine culture and  sensitivities decision was not to continue with fluoroquinolones due to risk of tendon rupture.  Patient being discharged on p.o. doxycycline as recommended by infectious disease for 5-day treatment duration.    3. Hypertension: Blood pressure better controlled on current regimen. continue metoprolol.   4. CKD: Stage III; avoid nephrotoxic agents.  Adequately hydrated with IV fluids.  Renal function improved and remains fairly stable.  Appreciate nephrology input   5. Renal transplant: Baseline creatinine 1.2-1.7 per nephrology notes from [facility]. No evidence of rejection at this time. Continue immunosuppressants.   6. Dementia: Continue donepezil and Celexa  7. Hyperlipidemia: Continue statin therapy   DISCHARGE CONDITIONS:  Stable CONSULTS OBTAINED:   DRUG ALLERGIES:   Allergies  Allergen Reactions  . Ace Inhibitors Cough  . Lisinopril Cough   DISCHARGE MEDICATIONS:   Allergies as of 03/26/2018      Reactions   Ace Inhibitors Cough   Lisinopril Cough      Medication List    TAKE these medications   atorvastatin 40 MG tablet Commonly known as:  LIPITOR Take 40 mg by mouth at bedtime.   calcitRIOL 0.25 MCG capsule Commonly known as:  ROCALTROL Take 0.25 mcg by mouth daily.   citalopram 20 MG tablet Commonly known as:  CELEXA Take 20 mg by mouth daily.   clindamycin 300 MG capsule Commonly known as:  CLEOCIN Take 1 capsule (300 mg total) by mouth 3 (three) times daily for 4 days.   donepezil 5 MG tablet Commonly known as:  ARICEPT Take 5 mg by mouth 2 (two) times daily.   folic acid 1 MG tablet Commonly known as:  FOLVITE Take  1 mg by mouth 2 (two) times daily.   furosemide 20 MG tablet Commonly known as:  LASIX Take 20 mg by mouth 2 (two) times daily.   metoprolol succinate 25 MG 24 hr tablet Commonly known as:  TOPROL-XL Take 1 tablet (25 mg total) by mouth daily.   mycophenolate 180 MG EC tablet Commonly known as:  MYFORTIC Take 180 mg  by mouth 2 (two) times daily.   potassium chloride 10 MEQ tablet Commonly known as:  K-DUR Take 20 mEq by mouth 2 (two) times daily.   sirolimus 1 MG tablet Commonly known as:  RAPAMUNE Take 3 mg by mouth daily.   TUMS ULTRA 1000 400 MG chewable tablet Generic drug:  calcium elemental as carbonate Chew 1,000 mg by mouth daily as needed for heartburn.        DISCHARGE INSTRUCTIONS:   DIET:  Cardiac diet DISCHARGE CONDITION:  Stable ACTIVITY:  Activity as tolerated OXYGEN:  Home Oxygen: No.  Oxygen Delivery: room air DISCHARGE LOCATION:  home   If you experience worsening of your admission symptoms, develop shortness of breath, life threatening emergency, suicidal or homicidal thoughts you must seek medical attention immediately by calling 911 or calling your MD immediately  if symptoms less severe.  You Must read complete instructions/literature along with all the possible adverse reactions/side effects for all the Medicines you take and that have been prescribed to you. Take any new Medicines after you have completely understood and accpet all the possible adverse reactions/side effects.   Please note  You were cared for by a hospitalist during your hospital stay. If you have any questions about your discharge medications or the care you received while you were in the hospital after you are discharged, you can call the unit and asked to speak with the hospitalist on call if the hospitalist that took care of you is not available. Once you are discharged, your primary care physician will handle any further medical issues. Please note that NO REFILLS for any discharge medications will be authorized once you are discharged, as it is imperative that you return to your primary care physician (or establish a relationship with a primary care physician if you do not have one) for your aftercare needs so that they can reassess your need for medications and monitor your lab  values.    On the day of Discharge:  VITAL SIGNS:  Blood pressure (!) 143/82, pulse 63, temperature 97.8 F (36.6 C), temperature source Oral, resp. rate 18, height 4\' 11"  (1.499 m), weight 93.2 kg, SpO2 100 %. PHYSICAL EXAMINATION:  GENERAL:  68 y.o.-year-old patient lying in the bed with no acute distress.  EYES: Pupils equal, round, reactive to light and accommodation. No scleral icterus. Extraocular muscles intact.  HEENT: Head atraumatic, normocephalic. Oropharynx and nasopharynx clear.  NECK:  Supple, no jugular venous distention. No thyroid enlargement, no tenderness.  LUNGS: Normal breath sounds bilaterally, no wheezing, rales,rhonchi or crepitation. No use of accessory muscles of respiration.  CARDIOVASCULAR: S1, S2 normal. No murmurs, rubs, or gallops.  ABDOMEN: Soft, non-tender, non-distended. Bowel sounds present. No organomegaly or mass.  EXTREMITIES: No pedal edema, cyanosis, or clubbing.  NEUROLOGIC: Cranial nerves II through XII are intact. Muscle strength 5/5 in all extremities. Sensation intact. Gait not checked.  PSYCHIATRIC: The patient is alert and oriented x 3.  SKIN: No obvious rash, lesion, or ulcer.  DATA REVIEW:   CBC Recent Labs  Lab 03/25/18 0622  WBC 5.0  HGB 10.4*  HCT  34.3*  PLT 216    Chemistries  Recent Labs  Lab 03/23/18 0004 03/25/18 0622  NA 138 140  K 3.4* 4.2  CL 107 109  CO2 22 26  GLUCOSE 159* 108*  BUN 36* 38*  CREATININE 1.43* 1.39*  CALCIUM 8.2* 8.4*  MG  --  2.3  AST 20  --   ALT 15  --   ALKPHOS 45  --   BILITOT 0.4  --      Microbiology Results  Results for orders placed or performed during the hospital encounter of 03/22/18  Culture, blood (routine x 2)     Status: None (Preliminary result)   Collection Time: 03/23/18 12:04 AM  Result Value Ref Range Status   Specimen Description BLOOD RIGHT ANTECUBITAL  Final   Special Requests   Final    BOTTLES DRAWN AEROBIC AND ANAEROBIC Blood Culture adequate volume    Culture   Final    NO GROWTH 3 DAYS Performed at [facility], [address] [place], [place] [address]    Report Status PENDING  Incomplete  Urine culture     Status: Abnormal   Collection Time: 03/23/18 12:04 AM  Result Value Ref Range Status   Specimen Description URINE, RANDOM  Final   Special Requests   Final    NONE Performed at [facility], [address] [place], [place] [address]    Culture >=100,000 COLONIES/mL STAPHYLOCOCCUS LUGDUNENSIS (A)  Final   Report Status 03/25/2018 FINAL  Final   Organism ID, Bacteria STAPHYLOCOCCUS LUGDUNENSIS (A)  Final      Susceptibility   Staphylococcus lugdunensis - MIC*    CIPROFLOXACIN <=0.5 SENSITIVE Sensitive     GENTAMICIN <=0.5 SENSITIVE Sensitive     NITROFURANTOIN <=16 SENSITIVE Sensitive     OXACILLIN >=4 RESISTANT Resistant     TETRACYCLINE <=1 SENSITIVE Sensitive     VANCOMYCIN <=0.5 SENSITIVE Sensitive     TRIMETH/SULFA <=10 SENSITIVE Sensitive     CLINDAMYCIN <=0.25 SENSITIVE Sensitive     RIFAMPIN <=0.5 SENSITIVE Sensitive     Inducible Clindamycin NEGATIVE Sensitive     * >=100,000 COLONIES/mL STAPHYLOCOCCUS LUGDUNENSIS  Culture, blood (routine x 2)     Status: None (Preliminary result)   Collection Time: 03/23/18 12:11 AM  Result Value Ref Range Status   Specimen Description BLOOD BLOOD RIGHT HAND  Final   Special Requests   Final    BOTTLES DRAWN AEROBIC AND ANAEROBIC Blood Culture results may not be optimal due to an excessive volume of blood received in culture bottles   Culture   Final    NO GROWTH 3 DAYS Performed at [facility], [address], [place], [place] [address]    Report Status PENDING  Incomplete    RADIOLOGY:  No results found.   Management plans discussed with the patient, family and they are in agreement.  CODE STATUS: Full Code   TOTAL TIME TAKING CARE OF THIS PATIENT: 36 minutes.    Dannica Bickham M.D on 03/26/2018 at 10:20 AM  Between 7am to 6pm - Pager  - [phone]  After 6pm go to [email or web address] - Technical brewer [facility]  Office  [phone]  CC: Primary care physician; Center, [facility]   Note: This dictation was prepared with Dragon dictation along with smaller phrase technology. Any transcriptional errors that result from this process are unintentional.

## 2018-03-26 NOTE — Care Management Note (Signed)
Case Management Note  Patient Details  Name: [name] MRN: [id number] Date of Birth: [birth year]  Subjective/Objective:     Patient is from home with husband, daughter and son.  She was admitted with AMS and UTI.  Discharging to home today.  Does not use any DME at home.  Current with PCP.  Obtains medications at [facility] in [place] without difficulty.  Offered CMS.gov list of [place] agencies.  Patient would like to use [facility] for RN, PT, Aide and SW.  Referral made to [facility] with [facility]. Her daughter or husband drives her to appointments.   No further needs identified at this time by care management.                Action/Plan:   Expected Discharge Date:  03/26/18               Expected Discharge Plan:  [place]  In-House Referral:     Discharge planning Services  CM Consult  Post Acute Care Choice:  Home Health Choice offered to:  Patient  DME Arranged:    DME Agency:     HH Arranged:  RN, PT, Nurse's Aide, Social Work [employer] Agency:  [place]  Status of Service:  Completed, signed off  If discussed at [employer], dates discussed:    Additional Comments:  Elza Rafter, RN 03/26/2018, 10:28 AM

## 2018-03-26 NOTE — Discharge Instructions (Signed)
Antibiotic Medicine, Adult ° °Antibiotic medicines are used to treat infections caused by bacteria, such as strep throat and urinary tract infection (UTI). Antibiotic medicines will not work for viral illnesses, such as colds or the flu (influenza). They work by killing the bacteria that is making you sick. Antibiotics can also have serious side effects. It is important that you take antibiotic medicines safely and only when needed. °When do I need to take antibiotics? °Antibiotics are medicines that treat bacterial infections. You may need antibiotics for: °· UTI. °· Strep throat. °· Meningitis. This infection affects the spinal cord and brain. °· Bacterial sinusitis. °· Serious lung infection. °You may start antibiotics while your health care provider waits for test results to come back. Common tests may include throat, urine, blood, or mucus culture. Your health care provider may change or stop the antibiotic depending on your test results. °When are antibiotics not needed? °You do not need antibiotics for most common illnesses. These illnesses may be caused by a virus, not a bacteria. You do not need antibiotics for: °· The common cold. °· Influenza. °· Sore throat. °· Discolored mucus. °· Bronchitis. °Antibiotics are not always needed for all bacterial infections. Many of these infections clear up without antibiotic treatment. Do not ask for or take antibiotics when they are not necessary. °How long should I take the antibiotic? °You must take the entire prescription. Continue to take your antibiotic for as long as told by your health care provider. Do not stop taking it even if you start to feel better. If you stop taking it too soon: °· You may start to feel sick again. °· Your infection may become harder to treat. °· Complications may develop. °Each course of antibiotics needs a different amount of time to work. Some antibiotic courses last only a few days. Some last about a week to 10 days. In some cases,  you may need to take antibiotics for a few weeks to completely treat the infection. °What if I miss a dose? °Try not to miss any doses of medicine. If you miss a dose, call your health care provider or pharmacist for advice. Sometimes it is okay to take the missed dose as soon as possible. °What are the risks of taking antibiotics? °Most antibiotics can cause an infection called Clostridioides difficile (C. difficile or C. diff), which causes severe diarrhea. This infection happens when the antibiotics kill the healthy bacteria in your intestines. This allows C. diff to grow. The infection needs to be treated right away. Let your health care provider know if: °· You have diarrhea while taking an antibiotic. °· You have diarrhea after you stop taking an antibiotic. C. diff infection can start weeks after stopping the antibiotic. °Taking an antibiotic also puts you at risk for getting a bacteria that does not respond to medicine (antibiotic-resistant infection) in the future. Antibiotics can cause bacteria to change so that if the antibiotic is taken again, the medicine is not able to kill the bacteria. These infections can be more serious and, in some cases, life-threatening. °Do antibiotics affect birth control? °Birth control pills may not work while you are on antibiotics. If you are taking birth control pills, continue taking them as usual and use a second form of birth control, such as a condom, to avoid unwanted pregnancy. Continue using the second form of birth control until your health care provider says you can stop. °What else should I know about taking antibiotics? °It is important for you to   take antibiotics exactly as told. Make sure that you: °· Take the entire course of antibiotic that was prescribed. Do not stop taking your antibiotics even if your symptoms improve. °· Take the correct amount of medicine each day. °· Ask your health care provider: °? How long to wait in between doses. °? If the  antibiotic should be taken with food. °? If there are any foods, drinks, or medicines that you should avoid while taking the antibiotics. °? If there are any side effects you should be aware of. °· Only use the antibiotics prescribed for you by your health care provider. Do not use antibiotics prescribed for someone else. °· Drink a large glass of water along with the antibiotics. °· Ask the pharmacist for a syringe, cup, or spoon that properly measures the antibiotics. °· Throw away any leftover medicine. °Contact a health care provider if: °· Your symptoms get worse. °· You have new joint pain or muscle aches that begin after starting the antibiotic. °When should I seek immediate medical care? °· You have signs of a serious allergic reaction to antibiotics. If you have signs of a severe allergic reaction, stop taking the antibiotic right away. Signs may include: °? Hives, which are raised, itchy, red bumps on the skin. °? Skin rash. °? Trouble breathing. °? A wheezing sound when you breathe. °? Swelling anywhere on your body. °? Feeling dizzy. °? Vomiting. °· Your urine turns dark or becomes blood-colored. °· Your skin turns yellow. °· You bruise or bleed easily. °· You have severe diarrhea and abdominal cramps. °· You have a severe headache. °Summary °· Antibiotic medicines are used to treat infections caused by bacteria, such as strep throat and UTIs. It is important that you take antibiotic medicines only when needed. °· Your health care provider may change or stop the antibiotic depending on your test results. °· Most antibiotics can cause an infection called Clostridioides difficile (C. difficile or C. diff), which causes severe diarrhea. Let your health care provider know if you develop diarrhea while taking an antibiotic. °· Take the entire course of antibiotic that was prescribed. °This information is not intended to replace advice given to you by your health care provider. Make sure you discuss any  questions you have with your health care provider. °Document Released: 10/14/2003 Document Revised: 08/01/2017 Document Reviewed: 02/02/2016 °Elsevier Interactive Patient Education © 2019 [employer] ° °

## 2018-03-26 NOTE — Progress Notes (Signed)
Nsg Discharge Note  Admit Date:  03/22/2018 Discharge date: 03/26/2018   Vrinda Heckstall to be D/C'd Home per MD order.  AVS completed.  Copy for chart, and copy for patient signed, and dated. Patient/caregiver able to verbalize understanding.  Discharge Medication: Allergies as of 03/26/2018      Reactions   Ace Inhibitors Cough   Lisinopril Cough      Medication List    TAKE these medications   atorvastatin 40 MG tablet Commonly known as:  LIPITOR Take 40 mg by mouth at bedtime.   calcitRIOL 0.25 MCG capsule Commonly known as:  ROCALTROL Take 0.25 mcg by mouth daily.   citalopram 20 MG tablet Commonly known as:  CELEXA Take 20 mg by mouth daily.   donepezil 5 MG tablet Commonly known as:  ARICEPT Take 5 mg by mouth 2 (two) times daily.   doxycycline 100 MG capsule Commonly known as:  VIBRAMYCIN Take 1 capsule (100 mg total) by mouth 2 (two) times daily for 5 days.   folic acid 1 MG tablet Commonly known as:  FOLVITE Take 1 mg by mouth 2 (two) times daily.   furosemide 20 MG tablet Commonly known as:  LASIX Take 20 mg by mouth 2 (two) times daily.   metoprolol succinate 25 MG 24 hr tablet Commonly known as:  TOPROL-XL Take 1 tablet (25 mg total) by mouth daily.   mycophenolate 180 MG EC tablet Commonly known as:  MYFORTIC Take 180 mg by mouth 2 (two) times daily.   potassium chloride 10 MEQ tablet Commonly known as:  K-DUR Take 20 mEq by mouth 2 (two) times daily.   sirolimus 1 MG tablet Commonly known as:  RAPAMUNE Take 3 mg by mouth daily.   TUMS ULTRA 1000 400 MG chewable tablet Generic drug:  calcium elemental as carbonate Chew 1,000 mg by mouth daily as needed for heartburn.       Discharge Assessment: Vitals:   03/26/18 0444 03/26/18 0919  BP: (!) 128/55 (!) 143/82  Pulse: 81 63  Resp: 18   Temp: 97.8 F (36.6 C)   SpO2: 100%    Skin clean, dry and intact without evidence of skin break down, no evidence of skin tears noted. IV catheter  discontinued intact. Site without signs and symptoms of complications - no redness or edema noted at insertion site, patient denies c/o pain - only slight tenderness at site.  Dressing with slight pressure applied.  D/c Instructions-Education: Discharge instructions given to patient/family with verbalized understanding. D/c education completed with patient/family including follow up instructions, medication list, d/c activities limitations if indicated, with other d/c instructions as indicated by MD - patient able to verbalize understanding, all questions fully answered. Patient instructed to return to ED, call 911, or call MD for any changes in condition.  Patient escorted via [place], and D/C home via private auto.  Eda Keys, RN 03/26/2018 1:02 PM

## 2018-03-26 NOTE — Progress Notes (Signed)
[facility]  ROUNDING NOTE   Subjective:   Patient with no complaints. Plan on discharge today.   Objective:  Vital signs in last 24 hours:  Temp:  [97.8 F (36.6 C)-98.4 F (36.9 C)] 97.8 F (36.6 C) (02/10 0444) Pulse Rate:  [62-81] 63 (02/10 0919) Resp:  [18-20] 18 (02/10 0444) BP: (128-145)/(55-82) 143/82 (02/10 0919) SpO2:  [100 %] 100 % (02/10 0444)  Weight change:  Filed Weights   03/23/18 0500 03/24/18 0439 03/25/18 0618  Weight: 92.5 kg 93.8 kg 93.2 kg    Intake/Output: I/O last 3 completed shifts: In: 571.2 [P.O.:240; I.V.:24.1; IV Piggyback:307.1] Out: 200 [Urine:200]   Intake/Output this shift:  No intake/output data recorded.  Physical Exam: General: No acute distress  Head: Normocephalic, atraumatic. Moist oral mucosal membranes  Eyes: Anicteric  Neck: Supple, trachea midline  Lungs:  Clear to auscultation, normal effort  Heart: S1S2 no rubs  Abdomen:  Soft, nontender, bowel sounds present  Extremities: No peripheral edema.  Neurologic: Awake, but confused  Skin: No lesions       Basic Metabolic Panel: Recent Labs  Lab 03/23/18 0004 03/25/18 0622  NA 138 140  K 3.4* 4.2  CL 107 109  CO2 22 26  GLUCOSE 159* 108*  BUN 36* 38*  CREATININE 1.43* 1.39*  CALCIUM 8.2* 8.4*  MG  --  2.3    Liver Function Tests: Recent Labs  Lab 03/23/18 0004  AST 20  ALT 15  ALKPHOS 45  BILITOT 0.4  PROT 6.8  ALBUMIN 3.5   No results for input(s): LIPASE, AMYLASE in the last 168 hours. No results for input(s): AMMONIA in the last 168 hours.  CBC: Recent Labs  Lab 03/23/18 0004 03/25/18 0622  WBC 5.0 5.0  NEUTROABS 4.0  --   HGB 9.3* 10.4*  HCT 30.9* 34.3*  MCV 80.5 81.3  PLT 196 216    Cardiac Enzymes: Recent Labs  Lab 03/23/18 0004  TROPONINI <0.03    BNP: Invalid input(s): POCBNP  CBG: No results for input(s): GLUCAP in the last 168 hours.  Microbiology: Results for orders placed or performed during the  hospital encounter of 03/22/18  Culture, blood (routine x 2)     Status: None (Preliminary result)   Collection Time: 03/23/18 12:04 AM  Result Value Ref Range Status   Specimen Description BLOOD RIGHT ANTECUBITAL  Final   Special Requests   Final    BOTTLES DRAWN AEROBIC AND ANAEROBIC Blood Culture adequate volume   Culture   Final    NO GROWTH 3 DAYS Performed at [facility], [address] [place], [place] [address]    Report Status PENDING  Incomplete  Urine culture     Status: Abnormal   Collection Time: 03/23/18 12:04 AM  Result Value Ref Range Status   Specimen Description URINE, RANDOM  Final   Special Requests   Final    NONE Performed at [facility], [address] [place], [place] [address]    Culture >=100,000 COLONIES/mL STAPHYLOCOCCUS LUGDUNENSIS (A)  Final   Report Status 03/25/2018 FINAL  Final   Organism ID, Bacteria STAPHYLOCOCCUS LUGDUNENSIS (A)  Final      Susceptibility   Staphylococcus lugdunensis - MIC*    CIPROFLOXACIN <=0.5 SENSITIVE Sensitive     GENTAMICIN <=0.5 SENSITIVE Sensitive     NITROFURANTOIN <=16 SENSITIVE Sensitive     OXACILLIN >=4 RESISTANT Resistant     TETRACYCLINE <=1 SENSITIVE Sensitive     VANCOMYCIN <=0.5 SENSITIVE Sensitive  TRIMETH/SULFA <=10 SENSITIVE Sensitive     CLINDAMYCIN <=0.25 SENSITIVE Sensitive     RIFAMPIN <=0.5 SENSITIVE Sensitive     Inducible Clindamycin NEGATIVE Sensitive     * >=100,000 COLONIES/mL STAPHYLOCOCCUS LUGDUNENSIS  Culture, blood (routine x 2)     Status: None (Preliminary result)   Collection Time: 03/23/18 12:11 AM  Result Value Ref Range Status   Specimen Description BLOOD BLOOD RIGHT HAND  Final   Special Requests   Final    BOTTLES DRAWN AEROBIC AND ANAEROBIC Blood Culture results may not be optimal due to an excessive volume of blood received in culture bottles   Culture   Final    NO GROWTH 3 DAYS Performed at [facility], [address] [place],  [place] [address]    Report Status PENDING  Incomplete    Coagulation Studies: No results for input(s): LABPROT, INR in the last 72 hours.  Urinalysis: No results for input(s): COLORURINE, LABSPEC, PHURINE, GLUCOSEU, HGBUR, BILIRUBINUR, KETONESUR, PROTEINUR, UROBILINOGEN, NITRITE, LEUKOCYTESUR in the last 72 hours.  Invalid input(s): APPERANCEUR    Imaging: No results found.   Medications:   . sodium chloride Stopped (03/26/18 0701)  . ciprofloxacin Stopped (03/26/18 0701)   . atorvastatin  40 mg Oral QHS  . calcitRIOL  0.25 mcg Oral Daily  . citalopram  20 mg Oral Daily  . docusate sodium  100 mg Oral BID  . donepezil  5 mg Oral BID  . feeding supplement (ENSURE ENLIVE)  237 mL Oral BID BM  . folic acid  1 mg Oral BID  . furosemide  20 mg Oral BID  . heparin  5,000 Units Subcutaneous Q8H  . metoprolol succinate  25 mg Oral Daily  . mycophenolate  180 mg Oral BID  . potassium chloride SA  20 mEq Oral BID  . sirolimus  3 mg Oral Daily   sodium chloride, acetaminophen **OR** acetaminophen, calcium carbonate, diphenhydrAMINE, ondansetron **OR** ondansetron (ZOFRAN) IV  Assessment/ Plan:   [name] is a 68 y.o. black female with renal transplant 2009 on sirolimus, mycophenolate, prednisone  1.  Chronic kidney disease stage III status post renal transplantation.  - Renal graft function remains stable.   - Continue sirolimus, mycophenolate, and prednisone.  2.  Urinary tract infection.  Staph species on 2/7 - ciprofloxacin.  3.  Secondary hyperparathyroidism.   - Continue calcitriol 0.25 mcg p.o. daily.  4.  Hypertension.   - Metoprolol  Follow up with Dr. Candiss [facility] 2/24 at 9am   LOS: [place] 2/10/20201:19 PM

## 2018-03-28 LAB — CULTURE, BLOOD (ROUTINE X 2)
Culture: NO GROWTH
Culture: NO GROWTH
Special Requests: ADEQUATE

## 2018-04-05 ENCOUNTER — Emergency Department: Payer: Medicare Other

## 2018-04-05 ENCOUNTER — Other Ambulatory Visit: Payer: Self-pay

## 2018-04-05 ENCOUNTER — Emergency Department
Admission: EM | Admit: 2018-04-05 | Discharge: 2018-04-05 | Disposition: A | Discharge status: Home / Self Care | Payer: Medicare Other | Attending: Emergency Medicine | Admitting: Emergency Medicine

## 2018-04-05 ENCOUNTER — Encounter: Payer: Self-pay | Admitting: Emergency Medicine

## 2018-04-05 DIAGNOSIS — Z79899 Other long term (current) drug therapy: Secondary | ICD-10-CM | POA: Diagnosis not present

## 2018-04-05 DIAGNOSIS — R197 Diarrhea, unspecified: Secondary | ICD-10-CM | POA: Insufficient documentation

## 2018-04-05 DIAGNOSIS — R4182 Altered mental status, unspecified: Secondary | ICD-10-CM | POA: Insufficient documentation

## 2018-04-05 DIAGNOSIS — Y69 Unspecified misadventure during surgical and medical care: Secondary | ICD-10-CM | POA: Insufficient documentation

## 2018-04-05 DIAGNOSIS — Z8542 Personal history of malignant neoplasm of other parts of uterus: Secondary | ICD-10-CM | POA: Diagnosis not present

## 2018-04-05 DIAGNOSIS — T8611 Kidney transplant rejection: Secondary | ICD-10-CM | POA: Insufficient documentation

## 2018-04-05 DIAGNOSIS — F0390 Unspecified dementia without behavioral disturbance: Secondary | ICD-10-CM | POA: Insufficient documentation

## 2018-04-05 DIAGNOSIS — Z85528 Personal history of other malignant neoplasm of kidney: Secondary | ICD-10-CM | POA: Insufficient documentation

## 2018-04-05 DIAGNOSIS — Z940 Kidney transplant status: Secondary | ICD-10-CM | POA: Insufficient documentation

## 2018-04-05 DIAGNOSIS — Z87891 Personal history of nicotine dependence: Secondary | ICD-10-CM | POA: Diagnosis not present

## 2018-04-05 LAB — URINE DRUG SCREEN, QUALITATIVE (ARMC ONLY)
Amphetamines, Ur Screen: NOT DETECTED
Barbiturates, Ur Screen: NOT DETECTED
Benzodiazepine, Ur Scrn: NOT DETECTED
COCAINE METABOLITE, UR ~~LOC~~: NOT DETECTED
Cannabinoid 50 Ng, Ur ~~LOC~~: NOT DETECTED
MDMA (Ecstasy)Ur Screen: NOT DETECTED
Methadone Scn, Ur: NOT DETECTED
Opiate, Ur Screen: NOT DETECTED
Phencyclidine (PCP) Ur S: NOT DETECTED
TRICYCLIC, UR SCREEN: NOT DETECTED

## 2018-04-05 LAB — CBC WITH DIFFERENTIAL/PLATELET
Abs Immature Granulocytes: 0.02 10*3/uL (ref 0.00–0.07)
Basophils Absolute: 0 10*3/uL (ref 0.0–0.1)
Basophils Relative: 1 %
Eosinophils Absolute: 0.1 10*3/uL (ref 0.0–0.5)
Eosinophils Relative: 3 %
HEMATOCRIT: 33.6 % — AB (ref 36.0–46.0)
HEMOGLOBIN: 10 g/dL — AB (ref 12.0–15.0)
Immature Granulocytes: 1 %
Lymphocytes Relative: 23 %
Lymphs Abs: 1 10*3/uL (ref 0.7–4.0)
MCH: 25.2 pg — ABNORMAL LOW (ref 26.0–34.0)
MCHC: 29.8 g/dL — ABNORMAL LOW (ref 30.0–36.0)
MCV: 84.6 fL (ref 80.0–100.0)
Monocytes Absolute: 0.7 10*3/uL (ref 0.1–1.0)
Monocytes Relative: 15 %
NRBC: 0 % (ref 0.0–0.2)
Neutro Abs: 2.5 10*3/uL (ref 1.7–7.7)
Neutrophils Relative %: 57 %
Platelets: 185 10*3/uL (ref 150–400)
RBC: 3.97 MIL/uL (ref 3.87–5.11)
RDW: 15.9 % — ABNORMAL HIGH (ref 11.5–15.5)
WBC: 4.3 10*3/uL (ref 4.0–10.5)

## 2018-04-05 LAB — COMPREHENSIVE METABOLIC PANEL
ALT: 22 U/L (ref 0–44)
AST: 20 U/L (ref 15–41)
Albumin: 3.4 g/dL — ABNORMAL LOW (ref 3.5–5.0)
Alkaline Phosphatase: 42 U/L (ref 38–126)
Anion gap: 6 (ref 5–15)
BUN: 31 mg/dL — ABNORMAL HIGH (ref 8–23)
CO2: 22 mmol/L (ref 22–32)
CREATININE: 1.41 mg/dL — AB (ref 0.44–1.00)
Calcium: 8.1 mg/dL — ABNORMAL LOW (ref 8.9–10.3)
Chloride: 111 mmol/L (ref 98–111)
GFR calc non Af Amer: 38 mL/min — ABNORMAL LOW (ref >60)
GFR, EST AFRICAN AMERICAN: 45 mL/min — AB (ref >60)
Glucose, Bld: 79 mg/dL (ref 70–99)
Potassium: 4.4 mmol/L (ref 3.5–5.1)
Sodium: 139 mmol/L (ref 135–145)
Total Bilirubin: 0.6 mg/dL (ref 0.3–1.2)
Total Protein: 6.7 g/dL (ref 6.5–8.1)

## 2018-04-05 LAB — URINALYSIS, COMPLETE (UACMP) WITH MICROSCOPIC
Bacteria, UA: NONE SEEN
Bilirubin Urine: NEGATIVE
Glucose, UA: NEGATIVE mg/dL
Ketones, ur: NEGATIVE mg/dL
Nitrite: NEGATIVE
Protein, ur: NEGATIVE mg/dL
Specific Gravity, Urine: 1.016 (ref 1.005–1.030)
pH: 6 (ref 5.0–8.0)

## 2018-04-05 NOTE — ED Provider Notes (Signed)
[facility] Emergency Department Provider Note   ____________________________________________   First MD Initiated Contact with Patient 04/05/18 1112     (approximate)  I have reviewed the triage vital signs and the nursing notes.   HISTORY  Chief Complaint Altered Mental Status  Chief complaint is confusion  HPI [name] is a 68 y.o. female who comes in by EMS.  Her daughter told EMS that she was confused this morning saying she would go into the kitchen and went somewhere else she realized she was confused.  She is been treated for UTIs frequently has a kidney transplant.  Patient herself notes that she has been confused.  She denies any nausea vomiting diarrhea fever chest pain cough dysuria headache or any other complaints.  Patient's daughter who I spoke with on the phone says that she has been having a lot of diarrhea last day or 2.  She has not had any stools here in the emergency room.  Past Medical History:  Diagnosis Date  . Anemia   . Cancer of kidney ([place])   . Dementia ([place])   . GERD (gastroesophageal reflux disease)   . Hypertension   . Renal disorder   . Uterine cancer [facility])     Patient Active Problem List   Diagnosis Date Noted  . Confusion 03/23/2018  . Hypokalemia 06/01/2017  . Sepsis ([place]) 06/29/2016  . UTI (urinary tract infection) 06/29/2016  . GERD (gastroesophageal reflux disease) 06/29/2016  . Dementia ([place]) 06/29/2016  . 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 Essential hypertension 07/06/2014  . H/O kidney transplant 07/06/2014    Past Surgical History:  Procedure Laterality Date  . ABDOMINAL HYSTERECTOMY    . AV FISTULA PLACEMENT    . BREAST BIOPSY     Patient is unsure but she thinks she remember a Bx  . BREAST BIOPSY Right 2017   benign  . KIDNEY TRANSPLANT    . NEPHRECTOMY TRANSPLANTED ORGAN      Prior to Admission medications   Medication Sig Start Date End Date Taking? Authorizing Provider    atorvastatin (LIPITOR) 40 MG tablet Take 40 mg by mouth at bedtime.    [provider]  calcitRIOL (ROCALTROL) 0.25 MCG capsule Take 0.25 mcg by mouth daily.    [provider]  calcium elemental as carbonate (TUMS ULTRA 1000) 400 MG chewable tablet Chew 1,000 mg by mouth daily as needed for heartburn.    [provider]  citalopram (CELEXA) 20 MG tablet Take 20 mg by mouth daily.    [provider]  donepezil (ARICEPT) 5 MG tablet Take 5 mg by mouth 2 (two) times daily.  04/05/16   [provider]  folic acid (FOLVITE) 1 MG tablet Take 1 mg by mouth 2 (two) times daily.     [provider]  furosemide (LASIX) 20 MG tablet Take 20 mg by mouth 2 (two) times daily.    [provider]  metoprolol succinate (TOPROL-XL) 25 MG 24 hr tablet Take 1 tablet (25 mg total) by mouth daily. 07/02/16   Gladstone Lighter, MD  mycophenolate (MYFORTIC) 180 MG EC tablet Take 180 mg by mouth 2 (two) times daily.    [provider]  potassium chloride (K-DUR) 10 MEQ tablet Take 20 mEq by mouth 2 (two) times daily. 01/26/18   [provider]  sirolimus (RAPAMUNE) 1 MG tablet Take 3 mg by mouth daily.     [provider]    Allergies Ace inhibitors and  Lisinopril  Family History  Problem Relation Age of Onset  . Stroke Mother   . Alcohol abuse Father   . Breast cancer Neg Hx     Social History Social History   Tobacco Use  . Smoking status: Former Research scientist (life sciences)  . Smokeless tobacco: Never Used  Substance Use Topics  . Alcohol use: No  . Drug use: No    Review of Systems  Constitutional: No fever/chills Eyes: No visual changes. ENT: No sore throat. Cardiovascular: Denies chest pain. Respiratory: Denies shortness of breath. Gastrointestinal: No abdominal pain.  No nausea, no vomiting.  No diarrhea.  No constipation. Genitourinary: Negative for dysuria. Musculoskeletal: Negative for back pain. Skin: Negative for  rash. Neurological: Negative for headaches, focal weakness   ____________________________________________   PHYSICAL EXAM:  VITAL SIGNS: ED Triage Vitals  Enc Vitals Group     BP      Pulse      Resp      Temp      Temp src      SpO2      Weight      Height      Head Circumference      Peak Flow      Pain Score      Pain Loc      Pain Edu?      Excl. in [place]?     Constitutional: Alert and oriented. Well appearing and in no acute distress. Eyes: Conjunctivae are normal. PER. EOMI. Head: Atraumatic. Nose: No congestion/rhinnorhea. Mouth/Throat: Mucous membranes are moist.  Oropharynx non-erythematous. Neck: No stridor.   Cardiovascular: Normal rate, regular rhythm. Grossly normal heart sounds.  Good peripheral circulation. Respiratory: Normal respiratory effort.  No retractions. Lungs CTAB. Gastrointestinal: Soft and nontender. No distention. No abdominal bruits. No CVA tenderness. Musculoskeletal: No lower extremity tenderness trace bilateral edema. . Neurologic:  Normal speech and language. No gross focal neurologic deficits are appreciated.  Skin:  Skin is warm, dry and intact. No rash noted.   ____________________________________________   LABS (all labs ordered are listed, but only abnormal results are displayed)  Labs Reviewed  COMPREHENSIVE METABOLIC PANEL - Abnormal; Notable for the following components:      Result Value   BUN 31 (*)    Creatinine, Ser 1.41 (*)    Calcium 8.1 (*)    Albumin 3.4 (*)    GFR calc non Af Amer 38 (*)    GFR calc Af Amer 45 (*)    All other components within normal limits  CBC WITH DIFFERENTIAL/PLATELET - Abnormal; Notable for the following components:   Hemoglobin 10.0 (*)    HCT 33.6 (*)    MCH 25.2 (*)    MCHC 29.8 (*)    RDW 15.9 (*)    All other components within normal limits  URINALYSIS, COMPLETE (UACMP) WITH MICROSCOPIC - Abnormal; Notable for the following components:   Color, Urine YELLOW (*)    APPearance  HAZY (*)    Hgb urine dipstick SMALL (*)    Leukocytes,Ua TRACE (*)    All other components within normal limits  GASTROINTESTINAL PANEL BY PCR, STOOL (REPLACES STOOL CULTURE)  C DIFFICILE QUICK SCREEN W PCR REFLEX  URINE DRUG SCREEN, QUALITATIVE ([facility] ONLY)   ____________________________________________  EKG   ____________________________________________  RADIOLOGY  ED MD interpretation  Official radiology report(s): Dg Chest 2 View  Result Date: 04/05/2018 CLINICAL DATA:  Altered mental status, history dementia, hypertension, uterine cancer, GERD, renal cancer EXAM: CHEST - 2 VIEW COMPARISON:  03/22/2018 FINDINGS: Enlargement of cardiac silhouette. Atherosclerotic calcification aorta. Mediastinal contours and pulmonary vascularity otherwise normal. Lungs clear. No infiltrate, pleural effusion or pneumothorax. Bones demineralized. IMPRESSION: Enlargement of cardiac silhouette. No acute abnormalities. Electronically Signed   By: Lavonia Dana M.D.   On: 04/05/2018 12:17   Ct Head Wo Contrast  Result Date: 04/05/2018 CLINICAL DATA:  68 year old female with history of increasing confusion. Urinary tract infection last week. EXAM: CT HEAD WITHOUT CONTRAST TECHNIQUE: Contiguous axial images were obtained from the base of the skull through the vertex without intravenous contrast. COMPARISON:  Head CT 03/23/2018. FINDINGS: Brain: Mild cerebral atrophy. Patchy and confluent areas of decreased attenuation are noted throughout the deep and periventricular white matter of the cerebral hemispheres bilaterally, compatible with chronic microvascular ischemic disease. No evidence of acute infarction, hemorrhage, hydrocephalus, extra-axial collection or mass lesion/mass effect. Vascular: No hyperdense vessel or unexpected calcification. Skull: Normal. Negative for fracture or focal lesion. Sinuses/Orbits: No acute finding. Other: None. IMPRESSION: 1. No acute intracranial abnormalities. Cerebral atrophy  and chronic microvascular ischemic changes in the cerebral white matter, similar to prior studies. Electronically Signed   By: Vinnie Langton M.D.   On: 04/05/2018 12:04    ____________________________________________   PROCEDURES  Procedure(s) performed:   Procedures  Critical Care performed:   ____________________________________________   INITIAL IMPRESSION / ASSESSMENT AND PLAN / ED COURSE    Here in the emergency room patient is calm and cooperative.  She does not seem to be confused.  She has urinated a couple times in the last few hours but urinalysis does not show any white cells or red cells or bacteria.   Clinical Course as of Apr 06 1455  Thu Apr 05, 2018  1249 MCHC(!): 29.8 [PM]    Clinical Course User Index [PM] Nena Polio, MD     ____________________________________________   FINAL CLINICAL IMPRESSION(S) / ED DIAGNOSES  Final diagnoses:  Altered mental status, unspecified altered mental status type     ED Discharge Orders    None       Note:  This document was prepared using Dragon voice recognition software and may include unintentional dictation errors.    Nena Polio, MD 04/05/18 [id number]

## 2018-04-05 NOTE — Discharge Instructions (Addendum)
The test that I have done today have all been negative.  I am not sure what is going on and she seems to be more with it now.  Please return here if she gets any worse.  Please have her follow-up with her regular doctor in the next day or 2 for recheck.

## 2018-04-05 NOTE — ED Notes (Signed)
Pt to the toilet for 2nd times. Urinated only.

## 2018-04-05 NOTE — ED Notes (Signed)
No esign available. Husband received DC instructions and signed for them d/t wife's dementia.

## 2018-04-05 NOTE — ED Triage Notes (Signed)
Per ems daughter sent in for increased confusion. Dx with uti last week, with hx of frequent uti's.

## 2018-05-11 ENCOUNTER — Other Ambulatory Visit: Payer: Self-pay

## 2018-05-11 ENCOUNTER — Emergency Department
Admission: EM | Admit: 2018-05-11 | Discharge: 2018-05-11 | Disposition: A | Discharge status: Home / Self Care | Payer: Medicare Other | Attending: Emergency Medicine | Admitting: Emergency Medicine

## 2018-05-11 ENCOUNTER — Encounter: Payer: Self-pay | Admitting: Emergency Medicine

## 2018-05-11 ENCOUNTER — Emergency Department: Payer: Medicare Other

## 2018-05-11 DIAGNOSIS — N186 End stage renal disease: Secondary | ICD-10-CM | POA: Insufficient documentation

## 2018-05-11 DIAGNOSIS — Z79899 Other long term (current) drug therapy: Secondary | ICD-10-CM | POA: Diagnosis not present

## 2018-05-11 DIAGNOSIS — Z940 Kidney transplant status: Secondary | ICD-10-CM | POA: Insufficient documentation

## 2018-05-11 DIAGNOSIS — Z8541 Personal history of malignant neoplasm of cervix uteri: Secondary | ICD-10-CM | POA: Insufficient documentation

## 2018-05-11 DIAGNOSIS — Z87891 Personal history of nicotine dependence: Secondary | ICD-10-CM | POA: Insufficient documentation

## 2018-05-11 DIAGNOSIS — R079 Chest pain, unspecified: Secondary | ICD-10-CM | POA: Diagnosis not present

## 2018-05-11 DIAGNOSIS — I120 Hypertensive chronic kidney disease with stage 5 chronic kidney disease or end stage renal disease: Secondary | ICD-10-CM | POA: Diagnosis not present

## 2018-05-11 DIAGNOSIS — Z85528 Personal history of other malignant neoplasm of kidney: Secondary | ICD-10-CM | POA: Diagnosis not present

## 2018-05-11 LAB — BASIC METABOLIC PANEL
Anion gap: 10 (ref 5–15)
BUN: 29 mg/dL — ABNORMAL HIGH (ref 8–23)
CHLORIDE: 108 mmol/L (ref 98–111)
CO2: 27 mmol/L (ref 22–32)
Calcium: 8.7 mg/dL — ABNORMAL LOW (ref 8.9–10.3)
Creatinine, Ser: 1.58 mg/dL — ABNORMAL HIGH (ref 0.44–1.00)
GFR calc Af Amer: 39 mL/min — ABNORMAL LOW (ref >60)
GFR calc non Af Amer: 34 mL/min — ABNORMAL LOW (ref >60)
Glucose, Bld: 100 mg/dL — ABNORMAL HIGH (ref 70–99)
Potassium: 4.1 mmol/L (ref 3.5–5.1)
Sodium: 145 mmol/L (ref 135–145)

## 2018-05-11 LAB — CBC
HEMATOCRIT: 32 % — AB (ref 36.0–46.0)
Hemoglobin: 9.7 g/dL — ABNORMAL LOW (ref 12.0–15.0)
MCH: 25.5 pg — ABNORMAL LOW (ref 26.0–34.0)
MCHC: 30.3 g/dL (ref 30.0–36.0)
MCV: 84 fL (ref 80.0–100.0)
Platelets: 170 10*3/uL (ref 150–400)
RBC: 3.81 MIL/uL — ABNORMAL LOW (ref 3.87–5.11)
RDW: 16.1 % — ABNORMAL HIGH (ref 11.5–15.5)
WBC: 6 10*3/uL (ref 4.0–10.5)
nRBC: 0 % (ref 0.0–0.2)

## 2018-05-11 LAB — TROPONIN I: Troponin I: <0.03 ng/mL (ref <0.03)

## 2018-05-11 NOTE — ED Triage Notes (Signed)
Pt arrives with concerns over a 10-15 minutes episode of chest pain that happened "a little while ago." Pt denies current chest pain or complaints. Family wanted pt to be evaluated. Pt poor historian due to dementia.

## 2018-05-11 NOTE — ED Notes (Addendum)
Reviewed discharge instructions, follow-up care with patient and family. Patient and family verbalized understanding of all information reviewed. Patient stable, with no distress noted at this time.

## 2018-05-11 NOTE — ED Notes (Signed)
Patient transported to X-ray 

## 2018-05-11 NOTE — ED Notes (Signed)
Admitting MD at bedside.

## 2018-05-11 NOTE — ED Provider Notes (Signed)
[facility] Emergency Department Provider Note  Time seen: 3:58 PM  I have reviewed the triage vital signs and the nursing notes.   HISTORY  Chief Complaint Chest Pain   HPI [name] is a 68 y.o. female with a past medical history of anemia, mild dementia, gastric reflux, ESRD status post renal transplant 3 years ago presents to the emergency department for chest discomfort.  According to the patient approximately 30 minutes ago she developed chest pain described as a weight on her chest lasting approximately 10 minutes and then resolving on its own.  Patient states a history of chest pain fairly frequently, does not believe she is ever had a heart attack but is not sure.  Patient denies any cough, congestion or fever.  Denies any complaints such as chest pain or shortness of breath currently.  Denies any diaphoresis or nausea at any point.    Past Medical History:  Diagnosis Date  . Anemia   . Cancer of kidney ([place])   . Dementia ([place])   . GERD (gastroesophageal reflux disease)   . Hypertension   . Renal disorder   . Uterine cancer [name])     Patient Active Problem List   Diagnosis Date Noted  . Confusion 03/23/2018  . Hypokalemia 06/01/2017  . Sepsis ([place]) 06/29/2016  . UTI (urinary tract infection) 06/29/2016  . GERD (gastroesophageal reflux disease) 06/29/2016  . Dementia ([place]) 06/29/2016  . 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 Essential hypertension 07/06/2014  . H/O kidney transplant 07/06/2014    Past Surgical History:  Procedure Laterality Date  . ABDOMINAL HYSTERECTOMY    . AV FISTULA PLACEMENT    . BREAST BIOPSY     Patient is unsure but she thinks she remember a Bx  . BREAST BIOPSY Right 2017   benign  . KIDNEY TRANSPLANT    . NEPHRECTOMY TRANSPLANTED ORGAN      Prior to Admission medications   Medication Sig Start Date End Date Taking? Authorizing Provider  atorvastatin (LIPITOR) 40 MG tablet Take 40 mg by mouth at  bedtime.    [provider]  calcitRIOL (ROCALTROL) 0.25 MCG capsule Take 0.25 mcg by mouth daily.    [provider]  calcium elemental as carbonate (TUMS ULTRA 1000) 400 MG chewable tablet Chew 1,000 mg by mouth daily as needed for heartburn.    [provider]  citalopram (CELEXA) 20 MG tablet Take 20 mg by mouth daily.    [provider]  donepezil (ARICEPT) 5 MG tablet Take 5 mg by mouth 2 (two) times daily.  04/05/16   [provider]  folic acid (FOLVITE) 1 MG tablet Take 1 mg by mouth 2 (two) times daily.     [provider]  furosemide (LASIX) 20 MG tablet Take 20 mg by mouth 2 (two) times daily.    [provider]  metoprolol succinate (TOPROL-XL) 25 MG 24 hr tablet Take 1 tablet (25 mg total) by mouth daily. 07/02/16   Gladstone Lighter, MD  mycophenolate (MYFORTIC) 180 MG EC tablet Take 180 mg by mouth 2 (two) times daily.    [provider]  potassium chloride (K-DUR) 10 MEQ tablet Take 20 mEq by mouth 2 (two) times daily. 01/26/18   [provider]  sirolimus (RAPAMUNE) 1 MG tablet Take 3 mg by mouth daily.     [provider]    Allergies  Allergen Reactions  . Ace Inhibitors Cough  . Lisinopril Cough    Family  History  Problem Relation Age of Onset  . Stroke Mother   . Alcohol abuse Father   . Breast cancer Neg Hx     Social History Social History   Tobacco Use  . Smoking status: Former Research scientist (life sciences)  . Smokeless tobacco: Never Used  Substance Use Topics  . Alcohol use: No  . Drug use: No    Review of Systems Constitutional: Negative for fever. Cardiovascular: Positive for chest pain last approximate 10 minutes and now resolved.  No complaints currently. Respiratory: Negative for shortness of breath. Gastrointestinal: Negative for abdominal pain, vomiting Musculoskeletal: Negative for musculoskeletal complaints Skin: Negative for skin complaints  Neurological: Negative for  headache All other ROS negative  ____________________________________________   PHYSICAL EXAM:  VITAL SIGNS: ED Triage Vitals  Enc Vitals Group     BP 05/11/18 1555 (!) 179/91     Pulse Rate 05/11/18 1555 71     Resp 05/11/18 1555 18     Temp --      Temp Source 05/11/18 1555 Oral     SpO2 05/11/18 1555 100 %     Weight 05/11/18 1553 162 lb (73.5 kg)     Height 05/11/18 1553 4\' 11"  (1.499 m)     Head Circumference --      Peak Flow --      Pain Score 05/11/18 1553 0     Pain Loc --      Pain Edu? --      Excl. in [place]? --    Constitutional: Alert and oriented. Well appearing and in no distress. Eyes: Normal exam ENT   Head: Normocephalic and atraumatic.   Mouth/Throat: Mucous membranes are moist. Cardiovascular: Normal rate, regular rhythm. Respiratory: Normal respiratory effort without tachypnea nor retractions. Breath sounds are clear Gastrointestinal: Soft and nontender. No distention.   Musculoskeletal: Nontender with normal range of motion in all extremities.  Neurologic:  Normal speech and language. No gross focal neurologic deficits  Skin:  Skin is warm, dry and intact.  Psychiatric: Mood and affect are normal.   ____________________________________________    EKG  EKG viewed and interpreted by myself shows a normal sinus rhythm at 61 bpm with a narrow QRS, normal axis, normal intervals, nonspecific ST changes.  ____________________________________________    RADIOLOGY  Chest x-ray negative  ____________________________________________   INITIAL IMPRESSION / ASSESSMENT AND PLAN / ED COURSE  Pertinent labs & imaging results that were available during my care of the patient were reviewed by me and considered in my medical decision making (see chart for details).  Patient presents to the emergency department for chest pain lasting approximately 10 to 15 minutes and then resolving on its own.  Patient states the pain felt like a weight or pressure  on her chest, denies any pain currently.  Patient does states she gets chest pain rather frequently but her family wanted her to get evaluated per patient.  Differential would include ACS, chest wall pain, angina, costochondritis.  We will check labs, chest x-ray, EKG and continue to closely monitor.  Currently the patient appears very well with no complaints.  Chest x-ray is negative.  Labs are largely at baseline/reassuring.  Repeat troponin is negative.  Overall the patient appears extremely well and continues to be asymptomatic in the emergency department.  We will discharge with PCP/cardiology follow-up.  ____________________________________________   FINAL CLINICAL IMPRESSION(S) / ED DIAGNOSES  Chest pain   Harvest Dark, MD 05/11/18 [phone]

## 2018-06-19 ENCOUNTER — Emergency Department: Payer: Medicare Other

## 2018-06-19 ENCOUNTER — Inpatient Hospital Stay
Admission: EM | Admit: 2018-06-19 | Discharge: 2018-06-21 | DRG: 690 | Disposition: A | Discharge status: Home / Self Care | Payer: Medicare Other | Attending: Internal Medicine | Admitting: Internal Medicine

## 2018-06-19 ENCOUNTER — Other Ambulatory Visit: Payer: Self-pay

## 2018-06-19 DIAGNOSIS — R410 Disorientation, unspecified: Secondary | ICD-10-CM | POA: Diagnosis present

## 2018-06-19 DIAGNOSIS — I10 Essential (primary) hypertension: Secondary | ICD-10-CM | POA: Diagnosis present

## 2018-06-19 DIAGNOSIS — Z90710 Acquired absence of both cervix and uterus: Secondary | ICD-10-CM

## 2018-06-19 DIAGNOSIS — K219 Gastro-esophageal reflux disease without esophagitis: Secondary | ICD-10-CM | POA: Diagnosis present

## 2018-06-19 DIAGNOSIS — Z20828 Contact with and (suspected) exposure to other viral communicable diseases: Secondary | ICD-10-CM | POA: Diagnosis present

## 2018-06-19 DIAGNOSIS — F0390 Unspecified dementia without behavioral disturbance: Secondary | ICD-10-CM | POA: Diagnosis present

## 2018-06-19 DIAGNOSIS — Z79899 Other long term (current) drug therapy: Secondary | ICD-10-CM | POA: Diagnosis not present

## 2018-06-19 DIAGNOSIS — N390 Urinary tract infection, site not specified: Principal | ICD-10-CM | POA: Diagnosis present

## 2018-06-19 DIAGNOSIS — Z8542 Personal history of malignant neoplasm of other parts of uterus: Secondary | ICD-10-CM

## 2018-06-19 DIAGNOSIS — B961 Klebsiella pneumoniae [K. pneumoniae] as the cause of diseases classified elsewhere: Secondary | ICD-10-CM | POA: Diagnosis present

## 2018-06-19 DIAGNOSIS — Z87891 Personal history of nicotine dependence: Secondary | ICD-10-CM

## 2018-06-19 DIAGNOSIS — Z940 Kidney transplant status: Secondary | ICD-10-CM | POA: Diagnosis not present

## 2018-06-19 DIAGNOSIS — Z85528 Personal history of other malignant neoplasm of kidney: Secondary | ICD-10-CM | POA: Diagnosis not present

## 2018-06-19 LAB — CBC
HCT: 30.6 % — ABNORMAL LOW (ref 36.0–46.0)
Hemoglobin: 9.2 g/dL — ABNORMAL LOW (ref 12.0–15.0)
MCH: 24.7 pg — ABNORMAL LOW (ref 26.0–34.0)
MCHC: 30.1 g/dL (ref 30.0–36.0)
MCV: 82.3 fL (ref 80.0–100.0)
Platelets: 158 10*3/uL (ref 150–400)
RBC: 3.72 MIL/uL — ABNORMAL LOW (ref 3.87–5.11)
RDW: 14.9 % (ref 11.5–15.5)
WBC: 7.4 10*3/uL (ref 4.0–10.5)
nRBC: 0 % (ref 0.0–0.2)

## 2018-06-19 LAB — COMPREHENSIVE METABOLIC PANEL
ALT: 16 U/L (ref 0–44)
AST: 20 U/L (ref 15–41)
Albumin: 3.6 g/dL (ref 3.5–5.0)
Alkaline Phosphatase: 57 U/L (ref 38–126)
Anion gap: 10 (ref 5–15)
BUN: 28 mg/dL — ABNORMAL HIGH (ref 8–23)
CO2: 26 mmol/L (ref 22–32)
Calcium: 8.1 mg/dL — ABNORMAL LOW (ref 8.9–10.3)
Chloride: 103 mmol/L (ref 98–111)
Creatinine, Ser: 1.7 mg/dL — ABNORMAL HIGH (ref 0.44–1.00)
GFR calc Af Amer: 36 mL/min — ABNORMAL LOW (ref >60)
GFR calc non Af Amer: 31 mL/min — ABNORMAL LOW (ref >60)
Glucose, Bld: 118 mg/dL — ABNORMAL HIGH (ref 70–99)
Potassium: 4.1 mmol/L (ref 3.5–5.1)
Sodium: 139 mmol/L (ref 135–145)
Total Bilirubin: 0.7 mg/dL (ref 0.3–1.2)
Total Protein: 7.1 g/dL (ref 6.5–8.1)

## 2018-06-19 LAB — TROPONIN I: Troponin I: <0.03 ng/mL (ref <0.03)

## 2018-06-19 MED ORDER — SODIUM CHLORIDE 0.9 % IV SOLN
1.0000 g | Freq: Once | INTRAVENOUS | Status: AC
  Administered 2018-06-19: 22:00:00 1 g via INTRAVENOUS
  Filled 2018-06-19: qty 10

## 2018-06-19 NOTE — ED Triage Notes (Signed)
Pt to ED via EMS from home with daughter. Per ems pts daughter states pt is altered. Per ems pts family couldn't give pts baseline. Pt is alert to self and place, unaware of time or situation. Pt does have dementia. Hx of kidney transplant and chf. Pt states she has burning sensation in genitals, pt arrives coughing.

## 2018-06-19 NOTE — H&P (Signed)
[facility] [place] NAME: [name]    MR#:  [id number]  DATE OF BIRTH:  04-10-1950  DATE OF ADMISSION:  06/19/2018  PRIMARY CARE PHYSICIAN: [facility] [place]   REQUESTING/REFERRING PHYSICIAN: Kerman Passey, MD  CHIEF COMPLAINT:   Chief Complaint  Patient presents with  . Altered Mental Status    HISTORY OF PRESENT ILLNESS:  [name]  is a 68 y.o. female who presents with chief complaint as above.  Patient brought to the ED by family member for evaluation given altered mental status.  Patient's daughter states that she is more confused than normal, but is unable to give a good description of her baseline.  Here the patient is alert and oriented to person and place but not to time or circumstance.  On evaluation in the ED she was found to have a urinary tract infection.  Hospitalist were called for admission  PAST MEDICAL HISTORY:   Past Medical History:  Diagnosis Date  . Anemia   . Cancer of kidney ([place])   . Dementia ([place])   . GERD (gastroesophageal reflux disease)   . Hypertension   . Renal disorder   . Uterine cancer ([place])      PAST SURGICAL HISTORY:   Past Surgical History:  Procedure Laterality Date  . ABDOMINAL HYSTERECTOMY    . AV FISTULA PLACEMENT    . BREAST BIOPSY     Patient is unsure but she thinks she remember a Bx  . BREAST BIOPSY Right 2017   benign  . KIDNEY TRANSPLANT    . NEPHRECTOMY TRANSPLANTED ORGAN       SOCIAL HISTORY:   Social History   Tobacco Use  . Smoking status: Former Research scientist (life sciences)  . Smokeless tobacco: Never Used  Substance Use Topics  . Alcohol use: No     FAMILY HISTORY:   Family History  Problem Relation Age of Onset  . Stroke Mother   . Alcohol abuse Father   . Breast cancer Neg Hx      DRUG ALLERGIES:   Allergies  Allergen Reactions  . Ace Inhibitors Cough  . Lisinopril Cough    MEDICATIONS AT HOME:   Prior to Admission medications    Medication Sig Start Date End Date Taking? Authorizing Provider  atorvastatin (LIPITOR) 40 MG tablet Take 40 mg by mouth at bedtime.   Yes [provider]  calcitRIOL (ROCALTROL) 0.25 MCG capsule Take 0.25 mcg by mouth daily.   Yes [provider]  calcium elemental as carbonate (TUMS ULTRA 1000) 400 MG chewable tablet Chew 1,000 mg by mouth daily as needed for heartburn.   Yes [provider]  citalopram (CELEXA) 20 MG tablet Take 20 mg by mouth daily.   Yes [provider]  donepezil (ARICEPT) 5 MG tablet Take 5 mg by mouth 2 (two) times daily.  04/05/16  Yes [provider]  folic acid (FOLVITE) 1 MG tablet Take 1 mg by mouth 2 (two) times daily.    Yes [provider]  furosemide (LASIX) 20 MG tablet Take 20 mg by mouth 2 (two) times daily.   Yes [provider]  meloxicam (MOBIC) 15 MG tablet Take 15 mg by mouth daily. 06/14/18  Yes [provider]  metoprolol succinate (TOPROL-XL) 25 MG 24 hr tablet Take 1 tablet (25 mg total) by mouth daily. 07/02/16  Yes Gladstone Lighter, MD  mycophenolate (MYFORTIC) 180 MG EC tablet Take 180 mg by mouth 2 (two)  times daily.   Yes [provider]  potassium chloride (K-DUR) 10 MEQ tablet Take 20 mEq by mouth 2 (two) times daily. 01/26/18  Yes [provider]  sirolimus (RAPAMUNE) 1 MG tablet Take 3 mg by mouth daily.    Yes [provider]    REVIEW OF SYSTEMS:  Review of Systems  Unable to perform ROS: Acuity of condition     VITAL SIGNS:   Vitals:   06/19/18 2033 06/19/18 2037  BP:  (!) 152/80  Pulse:  85  Resp:  (!) 25  Temp:  99.7 F (37.6 C)  TempSrc:  Oral  SpO2:  100%  Weight: 81.6 kg   Height: 5\' 7"  (1.702 m)    Wt Readings from Last 3 Encounters:  06/19/18 81.6 kg  05/11/18 73.5 kg  04/05/18 93 kg    PHYSICAL EXAMINATION:  Physical Exam  Vitals reviewed. Constitutional: She appears well-developed and well-nourished. No  distress.  HENT:  Head: Normocephalic and atraumatic.  Mouth/Throat: Oropharynx is clear and moist.  Eyes: Pupils are equal, round, and reactive to light. Conjunctivae and EOM are normal. No scleral icterus.  Neck: Normal range of motion. Neck supple. No JVD present. No thyromegaly present.  Cardiovascular: Normal rate, regular rhythm and intact distal pulses. Exam reveals no gallop and no friction rub.  No murmur heard. Respiratory: Effort normal and breath sounds normal. No respiratory distress. She has no wheezes. She has no rales.  GI: Soft. Bowel sounds are normal. She exhibits no distension. There is no abdominal tenderness.  Musculoskeletal: Normal range of motion.        General: No edema.     Comments: No arthritis, no gout  Lymphadenopathy:    She has no cervical adenopathy.  Neurological: She is alert. No cranial nerve deficit.  Oriented to person and place but not to time or circumstance.  No dysarthria, no aphasia  Skin: Skin is warm and dry. No rash noted. No erythema.  Psychiatric: She has a normal mood and affect. Her behavior is normal. Judgment and thought content normal.    LABORATORY PANEL:   CBC Recent Labs  Lab 06/19/18 2045  WBC 7.4  HGB 9.2*  HCT 30.6*  PLT 158   ------------------------------------------------------------------------------------------------------------------  Chemistries  Recent Labs  Lab 06/19/18 2045  NA 139  K 4.1  CL 103  CO2 26  GLUCOSE 118*  BUN 28*  CREATININE 1.70*  CALCIUM 8.1*  AST 20  ALT 16  ALKPHOS 57  BILITOT 0.7   ------------------------------------------------------------------------------------------------------------------  Cardiac Enzymes Recent Labs  Lab 06/19/18 2045  TROPONINI <0.03   ------------------------------------------------------------------------------------------------------------------  RADIOLOGY:  Dg Chest Portable 1 View  Result Date: 06/19/2018 CLINICAL DATA:  Altered mental  status EXAM: PORTABLE CHEST 1 VIEW COMPARISON:  05/11/2018, 03/22/2018 FINDINGS: Marked cardiomegaly with globular cardiac configuration. Mild central congestion. No focal opacity or pleural effusion. Aortic atherosclerosis. No pneumothorax. Surgical changes at the thoracic inlet. IMPRESSION: Marked cardiomegaly with slight central congestion. Consider pericardial effusion given globular cardiac configuration Electronically Signed   By: Donavan Foil M.D.   On: 06/19/2018 21:32    EKG:   Orders placed or performed during the hospital encounter of 05/11/18  . ED EKG  . ED EKG  . EKG 12-Lead  . EKG 12-Lead    IMPRESSION AND PLAN:  Principal Problem:   UTI (urinary tract infection) -IV antibiotics initiated, urine culture sent Active Problems:   Confusion -due to UTI as above, monitor for improvement with above treatment  Essential hypertension -continue home dose antihypertensives   H/O kidney transplant -continue home meds   GERD (gastroesophageal reflux disease) -home dose PPI  Chart review performed and case discussed with ED provider. Labs, imaging and/or ECG reviewed by provider and discussed with patient/family. Management plans discussed with the patient and/or family.  COVID-19 status: Pending     DVT PROPHYLAXIS: SubQ heparin  GI PROPHYLAXIS:  PPI   ADMISSION STATUS: Inpatient     CODE STATUS: Full Code Status History    Date Active Date Inactive Code Status Order ID Comments User Context   03/23/2018 6047 03/26/2018 1620 Full Code [id number]  Harrie Foreman, MD Inpatient   11/21/2017 0839 11/23/2017 1708 Full Code [id number]  Fritzi Mandes, MD Inpatient   06/01/2017 1402 06/03/2017 1831 Full Code [id number]  Loletha Grayer, MD ED   06/29/2016 2314 07/02/2016 1712 Full Code [id number]  Lance Coon, MD Inpatient   07/06/2014 2201 07/08/2014 1537 Full Code [id number]  Hower, Aaron Mose, MD ED      TOTAL TIME TAKING CARE OF THIS PATIENT: 45 minutes.   This patient was evaluated  in the context of the global COVID-19 pandemic, which necessitated consideration that the patient might be at risk for infection with the SARS-CoV-2 virus that causes COVID-19. Institutional protocols and algorithms that pertain to the evaluation of patients at risk for COVID-19 are in a state of rapid change based on information released by regulatory bodies including the CDC and federal and state organizations. These policies and algorithms were followed to the best of this provider's knowledge to date during the patient's care at this facility.  Ethlyn Daniels 06/19/2018, 11:15 PM  [facility]  Office  [phone]  CC: Primary care physician; [facility]  Note:  This document was prepared using Systems analyst and may include unintentional dictation errors.

## 2018-06-19 NOTE — ED Notes (Addendum)
ED TO INPATIENT HANDOFF REPORT  ED Nurse Name and Phone #: Metta Clines [phone]  S Name/Age/Gender [name] 68 y.o. female Room/Bed: ED10A/ED10A  Code Status   Code Status: Prior  Home/SNF/Other Home Patient oriented to: self and place Is this baseline? No   Triage Complete: Triage complete  Chief Complaint Fever  Triage Note Pt to ED via EMS from home with daughter. Per ems pts daughter states pt is altered. Per ems pts family couldn't give pts baseline. Pt is alert to self and place, unaware of time or situation. Pt does have dementia. Hx of kidney transplant and chf. Pt states she has burning sensation in genitals, pt arrives coughing.     Allergies Allergies  Allergen Reactions  . Ace Inhibitors Cough  . Lisinopril Cough    Level of Care/Admitting Diagnosis ED Disposition    ED Disposition Condition [facility]: [place] [[id number]]  Level of Care: Med-Surg [16]  Covid Evaluation: N/A  Diagnosis: UTI (urinary tract infection) [053976]  Admitting Physician: Lance Coon [[id number]]  Attending Physician: Lance Coon [phone]  Estimated length of stay: past midnight tomorrow  Certification:: I certify this patient will need inpatient services for at least 2 midnights  PT Class (Do Not Modify): Inpatient [101]  PT Acc Code (Do Not Modify): Private [1]       B Medical/Surgery History Past Medical History:  Diagnosis Date  . Anemia   . Cancer of kidney ([place])   . Dementia ([place])   . GERD (gastroesophageal reflux disease)   . Hypertension   . Renal disorder   . Uterine cancer [facility])    Past Surgical History:  Procedure Laterality Date  . ABDOMINAL HYSTERECTOMY    . AV FISTULA PLACEMENT    . BREAST BIOPSY     Patient is unsure but she thinks she remember a Bx  . BREAST BIOPSY Right 2017   benign  . KIDNEY TRANSPLANT    . NEPHRECTOMY TRANSPLANTED ORGAN       A IV Location/Drains/Wounds Patient  Lines/Drains/Airways Status   Active Line/Drains/Airways    Name:   Placement date:   Placement time:   Site:   Days:   Peripheral IV 06/19/18 Right Antecubital   06/19/18    2045    Antecubital   less than 1          Intake/Output Last 24 hours  Intake/Output Summary (Last 24 hours) at 06/19/2018 2333 Last data filed at 06/19/2018 2221 Gross per 24 hour  Intake 97.86 ml  Output -  Net 97.86 ml    Labs/Imaging Results for orders placed or performed during the hospital encounter of 06/19/18 (from the past 48 hour(s))  Comprehensive metabolic panel     Status: Abnormal   Collection Time: 06/19/18  8:45 PM  Result Value Ref Range   Sodium 139 135 - 145 mmol/L   Potassium 4.1 3.5 - 5.1 mmol/L   Chloride 103 98 - 111 mmol/L   CO2 26 22 - 32 mmol/L   Glucose, Bld 118 (H) 70 - 99 mg/dL   BUN 28 (H) 8 - 23 mg/dL   Creatinine, Ser 1.70 (H) 0.44 - 1.00 mg/dL   Calcium 8.1 (L) 8.9 - 10.3 mg/dL   Total Protein 7.1 6.5 - 8.1 g/dL   Albumin 3.6 3.5 - 5.0 g/dL   AST 20 15 - 41 U/L   ALT 16 0 - 44 U/L   Alkaline Phosphatase 57 38 - 126 U/L  Total Bilirubin 0.7 0.3 - 1.2 mg/dL   GFR calc non Af Amer 31 (L) >60 mL/min   GFR calc Af Amer 36 (L) >60 mL/min   Anion gap 10 5 - 15    Comment: Performed at [facility], [address] [place], [place] [address]  CBC     Status: Abnormal   Collection Time: 06/19/18  8:45 PM  Result Value Ref Range   WBC 7.4 4.0 - 10.5 K/uL   RBC 3.72 (L) 3.87 - 5.11 MIL/uL   Hemoglobin 9.2 (L) 12.0 - 15.0 g/dL   HCT 30.6 (L) 36.0 - 46.0 %   MCV 82.3 80.0 - 100.0 fL   MCH 24.7 (L) 26.0 - 34.0 pg   MCHC 30.1 30.0 - 36.0 g/dL   RDW 14.9 11.5 - 15.5 %   Platelets 158 150 - 400 K/uL   nRBC 0.0 0.0 - 0.2 %    Comment: Performed at [facility], [address] [place], [place] [address]  Troponin I - ONCE - STAT     Status: None   Collection Time: 06/19/18  8:45 PM  Result Value Ref Range   Troponin I <0.03 <0.03 ng/mL    Comment:  Performed at [facility], [address] [place], [place] [address]  Urinalysis, Complete w Microscopic     Status: Abnormal   Collection Time: 06/19/18  8:45 PM  Result Value Ref Range   Color, Urine YELLOW (A) YELLOW   APPearance CLOUDY (A) CLEAR   Specific Gravity, Urine 1.012 1.005 - 1.030   pH 5.0 5.0 - 8.0   Glucose, UA NEGATIVE NEGATIVE mg/dL   Hgb urine dipstick MODERATE (A) NEGATIVE   Bilirubin Urine NEGATIVE NEGATIVE   Ketones, ur NEGATIVE NEGATIVE mg/dL   Protein, ur NEGATIVE NEGATIVE mg/dL   Nitrite POSITIVE (A) NEGATIVE   Leukocytes,Ua LARGE (A) NEGATIVE    Comment: Performed at [facility], [address], [place], [place] [address]   Dg Chest Portable 1 View  Result Date: 06/19/2018 CLINICAL DATA:  Altered mental status EXAM: PORTABLE CHEST 1 VIEW COMPARISON:  05/11/2018, 03/22/2018 FINDINGS: Marked cardiomegaly with globular cardiac configuration. Mild central congestion. No focal opacity or pleural effusion. Aortic atherosclerosis. No pneumothorax. Surgical changes at the thoracic inlet. IMPRESSION: Marked cardiomegaly with slight central congestion. Consider pericardial effusion given globular cardiac configuration Electronically Signed   By: Donavan Foil M.D.   On: 06/19/2018 21:32    Pending Labs Unresulted Labs (From admission, onward)    Start     Ordered   06/19/18 2106  SARS Coronavirus 2 (CEPHEID - Performed in [place] hospital lab), Hosp Order  (Asymptomatic Patients Labs)  Once,   STAT    Question:  Rule Out  Answer:  Yes   06/19/18 2105   06/19/18 2105  Urine culture  ONCE - STAT,   STAT     06/19/18 2105   Signed and Held  HIV antibody (Routine Testing)  Once,   R     Signed and Held   Signed and Held  CBC  (heparin)  Once,   R    Comments:  Baseline for heparin therapy IF NOT ALREADY DRAWN.  Notify MD if PLT < 100 K.    Signed and Held   Signed and Held  Creatinine, serum  (heparin)  Once,   R    Comments:  Baseline for  heparin therapy IF NOT ALREADY DRAWN.    Signed and Held  Signed and Held  Basic metabolic panel  Tomorrow morning,   R     Signed and Held   Signed and Held  CBC  Tomorrow morning,   R     Signed and Held          Vitals/Pain Today's Vitals   06/19/18 2033 06/19/18 2037  BP:  (!) 152/80  Pulse:  85  Resp:  (!) 25  Temp:  99.7 F (37.6 C)  TempSrc:  Oral  SpO2:  100%  Weight: 81.6 kg   Height: 5\' 7"  (1.702 m)   PainSc: 0-No pain     Isolation Precautions No active isolations  Medications Medications  cefTRIAXone (ROCEPHIN) 1 g in sodium chloride 0.9 % 100 mL IVPB (0 g Intravenous Stopped 06/19/18 2221)    Mobility walks with device/person assist High fall risk   Focused Assessments Alert to self; place and situation at times but not always.    R Recommendations: See Admitting Provider Note  Report given to:   Additional Notes:  Please ask.

## 2018-06-19 NOTE — ED Provider Notes (Signed)
[facility] Emergency Department Provider Note  Time seen: 9:11 PM  I have reviewed the triage vital signs and the nursing notes.   HISTORY  Chief Complaint Altered Mental Status   HPI [name] is a 68 y.o. female with a past medical history of dementia, anemia, gastric reflux, hypertension, end-stage renal disease on hemodialysis, presents to the emergency department for altered mental status/increased confusion.   According to EMS patient lives at home with her daughter.  Daughter states that the patient is more altered and confused than her normal but they also state the daughter was unable to give a good description of the patient's baseline.  Here the patient is awake and alert.  She is oriented to person and place but unaware of time or why she is here.  Patient is unable to contribute to her history or review of systems at this time.  Patient is noted to have a low-grade temperature 99.7 upon arrival with mild tachypnea.  Past Medical History:  Diagnosis Date  . Anemia   . Cancer of kidney ([place])   . Dementia ([place])   . GERD (gastroesophageal reflux disease)   . Hypertension   . Renal disorder   . Uterine cancer [facility])     Patient Active Problem List   Diagnosis Date Noted  . Confusion 03/23/2018  . Hypokalemia 06/01/2017  . Sepsis ([place]) 06/29/2016  . UTI (urinary tract infection) 06/29/2016  . GERD (gastroesophageal reflux disease) 06/29/2016  . Dementia ([place]) 06/29/2016  . 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 Essential hypertension 07/06/2014  . H/O kidney transplant 07/06/2014    Past Surgical History:  Procedure Laterality Date  . ABDOMINAL HYSTERECTOMY    . AV FISTULA PLACEMENT    . BREAST BIOPSY     Patient is unsure but she thinks she remember a Bx  . BREAST BIOPSY Right 2017   benign  . KIDNEY TRANSPLANT    . NEPHRECTOMY TRANSPLANTED ORGAN      Prior to Admission medications   Medication Sig Start Date End Date Taking?  Authorizing Provider  atorvastatin (LIPITOR) 40 MG tablet Take 40 mg by mouth at bedtime.    [provider]  calcitRIOL (ROCALTROL) 0.25 MCG capsule Take 0.25 mcg by mouth daily.    [provider]  calcium elemental as carbonate (TUMS ULTRA 1000) 400 MG chewable tablet Chew 1,000 mg by mouth daily as needed for heartburn.    [provider]  citalopram (CELEXA) 20 MG tablet Take 20 mg by mouth daily.    [provider]  donepezil (ARICEPT) 5 MG tablet Take 5 mg by mouth 2 (two) times daily.  04/05/16   [provider]  folic acid (FOLVITE) 1 MG tablet Take 1 mg by mouth 2 (two) times daily.     [provider]  furosemide (LASIX) 20 MG tablet Take 20 mg by mouth 2 (two) times daily.    [provider]  metoprolol succinate (TOPROL-XL) 25 MG 24 hr tablet Take 1 tablet (25 mg total) by mouth daily. 07/02/16   Gladstone Lighter, MD  mycophenolate (MYFORTIC) 180 MG EC tablet Take 180 mg by mouth 2 (two) times daily.    [provider]  potassium chloride (K-DUR) 10 MEQ tablet Take 20 mEq by mouth 2 (two) times daily. 01/26/18   [provider]  sirolimus (RAPAMUNE) 1 MG tablet Take 3 mg by mouth daily.     [provider]    Allergies  Allergen Reactions  .  Ace Inhibitors Cough  . Lisinopril Cough    Family History  Problem Relation Age of Onset  . Stroke Mother   . Alcohol abuse Father   . Breast cancer Neg Hx     Social History Social History   Tobacco Use  . Smoking status: Former Research scientist (life sciences)  . Smokeless tobacco: Never Used  Substance Use Topics  . Alcohol use: No  . Drug use: No    Review of Systems Unable to obtain an adequate/accurate review of systems secondary to altered mental status/baseline dementia.  ____________________________________________   PHYSICAL EXAM:  VITAL SIGNS: ED Triage Vitals  Enc Vitals Group     BP 06/19/18 2037 (!) 152/80     Pulse Rate 06/19/18 2037 85      Resp 06/19/18 2037 (!) 25     Temp 06/19/18 2037 99.7 F (37.6 C)     Temp Source 06/19/18 2037 Oral     SpO2 06/19/18 2037 100 %     Weight 06/19/18 2033 180 lb (81.6 kg)     Height 06/19/18 2033 5\' 7"  (1.702 m)     Head Circumference --      Peak Flow --      Pain Score 06/19/18 2033 0     Pain Loc --      Pain Edu? --      Excl. in [place]? --     Constitutional: Patient is awake and alert, oriented to person and place only.  No distress. Eyes: Normal exam ENT      Head: Normocephalic and atraumatic.      Mouth/Throat: Mucous membranes are moist. Cardiovascular: Normal rate, regular rhythm.  Respiratory: Normal respiratory effort without tachypnea nor retractions. Breath sounds are clear  Gastrointestinal: Soft and nontender. No distention.  Musculoskeletal: Left upper extremity AV fistula with good thrill. Neurologic:  Normal speech and language. No gross focal neurologic deficits Skin:  Skin is warm, dry and intact.  Psychiatric: Mood and affect are normal.   ____________________________________________    EKG  EKG viewed and interpreted by myself shows a normal sinus rhythm 83 bpm with a narrow QRS, normal axis, largely normal intervals with nonspecific ST changes.  ____________________________________________    RADIOLOGY  X-rays negative  ____________________________________________   INITIAL IMPRESSION / ASSESSMENT AND PLAN / ED COURSE  Pertinent labs & imaging results that were available during my care of the patient were reviewed by me and considered in my medical decision making (see chart for details).   Patient presents to the emergency department for altered mental status.  History of dementia at baseline.  Patient is oriented to person and place only at this time.  Is not entirely clear what her baseline is.  Patient does have a low-grade temperature 99.7.  Differential is quite broad would include baseline dementia, infectious etiologies such as  pneumonia or urinary tract infection.  We will also test for coronavirus as a precaution in addition to sending other basic labs.  Patient is calm cooperative and in no distress.  Chest x-ray is negative.  I discussed the patient with her daughter, patient had a renal transplant, is no longer on hemodialysis.  Patient does appear to have a urinary tract infection, given her history of renal transplant and increased confusion at home as well as low-grade fever 99.7 we will admit to the hospital service for IV antibiotics.  Daughter agreeable to plan of care.   Anjolina Byrer was evaluated in Emergency Department on 06/19/2018 for the symptoms described  in the history of present illness. She was evaluated in the context of the global COVID-19 pandemic, which necessitated consideration that the patient might be at risk for infection with the SARS-CoV-2 virus that causes COVID-19. Institutional protocols and algorithms that pertain to the evaluation of patients at risk for COVID-19 are in a state of rapid change based on information released by regulatory bodies including the CDC and federal and state organizations. These policies and algorithms were followed during the patient's care in the ED.  ____________________________________________   FINAL CLINICAL IMPRESSION(S) / ED DIAGNOSES  Confusion Urinary tract infection   Harvest Dark, MD 06/19/18 2140

## 2018-06-20 LAB — CBC
HCT: 28.8 % — ABNORMAL LOW (ref 36.0–46.0)
Hemoglobin: 8.8 g/dL — ABNORMAL LOW (ref 12.0–15.0)
MCH: 25.1 pg — ABNORMAL LOW (ref 26.0–34.0)
MCHC: 30.6 g/dL (ref 30.0–36.0)
MCV: 82.1 fL (ref 80.0–100.0)
Platelets: 155 10*3/uL (ref 150–400)
RBC: 3.51 MIL/uL — ABNORMAL LOW (ref 3.87–5.11)
RDW: 14.8 % (ref 11.5–15.5)
WBC: 6.9 10*3/uL (ref 4.0–10.5)
nRBC: 0 % (ref 0.0–0.2)

## 2018-06-20 LAB — URINALYSIS, COMPLETE (UACMP) WITH MICROSCOPIC
Bilirubin Urine: NEGATIVE
Glucose, UA: NEGATIVE mg/dL
Ketones, ur: NEGATIVE mg/dL
Nitrite: POSITIVE — AB
Protein, ur: NEGATIVE mg/dL
Specific Gravity, Urine: 1.012 (ref 1.005–1.030)
WBC, UA: >50 WBC/hpf (ref 0–5)
pH: 5 (ref 5.0–8.0)

## 2018-06-20 LAB — BASIC METABOLIC PANEL
Anion gap: 11 (ref 5–15)
BUN: 24 mg/dL — ABNORMAL HIGH (ref 8–23)
CO2: 25 mmol/L (ref 22–32)
Calcium: 8.2 mg/dL — ABNORMAL LOW (ref 8.9–10.3)
Chloride: 105 mmol/L (ref 98–111)
Creatinine, Ser: 1.61 mg/dL — ABNORMAL HIGH (ref 0.44–1.00)
GFR calc Af Amer: 38 mL/min — ABNORMAL LOW (ref >60)
GFR calc non Af Amer: 33 mL/min — ABNORMAL LOW (ref >60)
Glucose, Bld: 113 mg/dL — ABNORMAL HIGH (ref 70–99)
Potassium: 3.6 mmol/L (ref 3.5–5.1)
Sodium: 141 mmol/L (ref 135–145)

## 2018-06-20 LAB — SARS CORONAVIRUS 2 BY RT PCR (HOSPITAL ORDER, PERFORMED IN ~~LOC~~ HOSPITAL LAB): SARS Coronavirus 2: NEGATIVE

## 2018-06-20 MED ORDER — ACETAMINOPHEN 325 MG PO TABS
650.0000 mg | ORAL_TABLET | Freq: Four times a day (QID) | ORAL | Status: DC | PRN
  Administered 2018-06-20 (×2): 650 mg via ORAL
  Filled 2018-06-20 (×2): qty 2

## 2018-06-20 MED ORDER — SODIUM CHLORIDE 0.9 % IV SOLN
INTRAVENOUS | Status: DC
  Administered 2018-06-20 – 2018-06-21 (×2): via INTRAVENOUS

## 2018-06-20 MED ORDER — HEPARIN SODIUM (PORCINE) 5000 UNIT/ML IJ SOLN
5000.0000 [IU] | Freq: Three times a day (TID) | INTRAMUSCULAR | Status: DC
  Administered 2018-06-20 – 2018-06-21 (×4): 5000 [IU] via SUBCUTANEOUS
  Filled 2018-06-20 (×4): qty 1

## 2018-06-20 MED ORDER — MELATONIN 5 MG PO TABS
20.0000 mg | ORAL_TABLET | Freq: Every day | ORAL | Status: DC
  Administered 2018-06-20: 23:00:00 20 mg via ORAL
  Filled 2018-06-20 (×2): qty 4

## 2018-06-20 MED ORDER — MYCOPHENOLATE SODIUM 180 MG PO TBEC
180.0000 mg | DELAYED_RELEASE_TABLET | Freq: Two times a day (BID) | ORAL | Status: DC
  Administered 2018-06-20 – 2018-06-21 (×3): 180 mg via ORAL
  Filled 2018-06-20 (×4): qty 1

## 2018-06-20 MED ORDER — CITALOPRAM HYDROBROMIDE 20 MG PO TABS
20.0000 mg | ORAL_TABLET | Freq: Every day | ORAL | Status: DC
  Administered 2018-06-20 – 2018-06-21 (×2): 20 mg via ORAL
  Filled 2018-06-20 (×2): qty 1

## 2018-06-20 MED ORDER — SIROLIMUS 1 MG PO TABS
3.0000 mg | ORAL_TABLET | Freq: Every day | ORAL | Status: DC
  Administered 2018-06-20 – 2018-06-21 (×2): 3 mg via ORAL
  Filled 2018-06-20 (×2): qty 3

## 2018-06-20 MED ORDER — FOLIC ACID 1 MG PO TABS
1.0000 mg | ORAL_TABLET | Freq: Two times a day (BID) | ORAL | Status: DC
  Administered 2018-06-20 – 2018-06-21 (×3): 1 mg via ORAL
  Filled 2018-06-20 (×3): qty 1

## 2018-06-20 MED ORDER — SODIUM CHLORIDE 0.9 % IV SOLN
1.0000 g | INTRAVENOUS | Status: DC
  Administered 2018-06-20: 23:00:00 1 g via INTRAVENOUS
  Filled 2018-06-20: qty 1
  Filled 2018-06-20: qty 10

## 2018-06-20 MED ORDER — METOPROLOL SUCCINATE ER 25 MG PO TB24
25.0000 mg | ORAL_TABLET | Freq: Every day | ORAL | Status: DC
  Administered 2018-06-20 – 2018-06-21 (×2): 25 mg via ORAL
  Filled 2018-06-20 (×2): qty 1

## 2018-06-20 MED ORDER — ONDANSETRON HCL 4 MG PO TABS: 4.0000 mg | ORAL_TABLET | Freq: Four times a day (QID) | ORAL | Status: DC | PRN

## 2018-06-20 MED ORDER — MUPIROCIN 2 % EX OINT
1.0000 "application " | TOPICAL_OINTMENT | Freq: Two times a day (BID) | CUTANEOUS | Status: DC
  Administered 2018-06-21: 1 via TOPICAL
  Filled 2018-06-20: qty 22

## 2018-06-20 MED ORDER — ONDANSETRON HCL 4 MG/2ML IJ SOLN: 4.0000 mg | Freq: Four times a day (QID) | INTRAMUSCULAR | Status: DC | PRN

## 2018-06-20 MED ORDER — CALCITRIOL 0.25 MCG PO CAPS
0.2500 ug | ORAL_CAPSULE | Freq: Every day | ORAL | Status: DC
  Administered 2018-06-20 – 2018-06-21 (×2): 0.25 ug via ORAL
  Filled 2018-06-20 (×2): qty 1

## 2018-06-20 MED ORDER — ACETAMINOPHEN 650 MG RE SUPP: 650.0000 mg | Freq: Four times a day (QID) | RECTAL | Status: DC | PRN

## 2018-06-20 MED ORDER — DONEPEZIL HCL 5 MG PO TABS
5.0000 mg | ORAL_TABLET | Freq: Two times a day (BID) | ORAL | Status: DC
  Administered 2018-06-20 – 2018-06-21 (×3): 5 mg via ORAL
  Filled 2018-06-20 (×4): qty 1

## 2018-06-20 MED ORDER — ATORVASTATIN CALCIUM 20 MG PO TABS
40.0000 mg | ORAL_TABLET | Freq: Every day | ORAL | Status: DC
  Administered 2018-06-20: 23:00:00 40 mg via ORAL
  Filled 2018-06-20: qty 2

## 2018-06-20 NOTE — Progress Notes (Signed)
Attempted to call patients daughter to complete admission profile, no answer with number available.   Primary RN made aware. Madlyn Frankel, RN

## 2018-06-20 NOTE — ED Notes (Signed)
Pt urinated 350cc

## 2018-06-20 NOTE — Progress Notes (Signed)
[facility] [place] NAME: [name]    MR#:  [id number]  DATE OF BIRTH:  1950/06/06  SUBJECTIVE:  CHIEF COMPLAINT:   Chief Complaint  Patient presents with  . Altered Mental Status   Here with UTI, confusion. No complains.   REVIEW OF SYSTEMS:  CONSTITUTIONAL: No fever, fatigue or weakness.  EYES: No blurred or double vision.  EARS, NOSE, AND THROAT: No tinnitus or ear pain.  RESPIRATORY: No cough, shortness of breath, wheezing or hemoptysis.  CARDIOVASCULAR: No chest pain, orthopnea, edema.  GASTROINTESTINAL: No nausea, vomiting, diarrhea or abdominal pain.  GENITOURINARY: No dysuria, hematuria.  ENDOCRINE: No polyuria, nocturia,  HEMATOLOGY: No anemia, easy bruising or bleeding SKIN: No rash or lesion. MUSCULOSKELETAL: No joint pain or arthritis.   NEUROLOGIC: No tingling, numbness, weakness.  PSYCHIATRY: No anxiety or depression.   ROS  DRUG ALLERGIES:   Allergies  Allergen Reactions  . Ace Inhibitors Cough  . Lisinopril Cough    VITALS:  Blood pressure 130/75, pulse 78, temperature 99.9 F (37.7 C), temperature source Oral, resp. rate (!) 24, height 4\' 11"  (1.499 m), weight 112.8 kg, SpO2 99 %.  PHYSICAL EXAMINATION:  GENERAL:  68 y.o.-year-old patient lying in the bed with no acute distress.  EYES: Pupils equal, round, reactive to light and accommodation. No scleral icterus. Extraocular muscles intact.  HEENT: Head atraumatic, normocephalic. Oropharynx and nasopharynx clear.  NECK:  Supple, no jugular venous distention. No thyroid enlargement, no tenderness.  LUNGS: Normal breath sounds bilaterally, no wheezing, rales,rhonchi or crepitation. No use of accessory muscles of respiration.  CARDIOVASCULAR: S1, S2 normal. No murmurs, rubs, or gallops.  ABDOMEN: Soft, nontender, nondistended. Bowel sounds present. No organomegaly or mass.  EXTREMITIES: No pedal edema, cyanosis, or clubbing.  NEUROLOGIC: Cranial nerves  II through XII are intact. Muscle strength 4/5 in all extremities. Sensation intact. Gait not checked.  PSYCHIATRIC: The patient is alert and oriented x 1.  SKIN: No obvious rash, lesion, or ulcer.   Physical Exam LABORATORY PANEL:   CBC Recent Labs  Lab 06/20/18 0332  WBC 6.9  HGB 8.8*  HCT 28.8*  PLT 155   ------------------------------------------------------------------------------------------------------------------  Chemistries  Recent Labs  Lab 06/19/18 2045 06/20/18 0332  NA 139 141  K 4.1 3.6  CL 103 105  CO2 26 25  GLUCOSE 118* 113*  BUN 28* 24*  CREATININE 1.70* 1.61*  CALCIUM 8.1* 8.2*  AST 20  --   ALT 16  --   ALKPHOS 57  --   BILITOT 0.7  --    ------------------------------------------------------------------------------------------------------------------  Cardiac Enzymes Recent Labs  Lab 06/19/18 2045  TROPONINI <0.03   ------------------------------------------------------------------------------------------------------------------  RADIOLOGY:  Dg Chest Portable 1 View  Result Date: 06/19/2018 CLINICAL DATA:  Altered mental status EXAM: PORTABLE CHEST 1 VIEW COMPARISON:  05/11/2018, 03/22/2018 FINDINGS: Marked cardiomegaly with globular cardiac configuration. Mild central congestion. No focal opacity or pleural effusion. Aortic atherosclerosis. No pneumothorax. Surgical changes at the thoracic inlet. IMPRESSION: Marked cardiomegaly with slight central congestion. Consider pericardial effusion given globular cardiac configuration Electronically Signed   By: Donavan Foil M.D.   On: 06/19/2018 21:32    ASSESSMENT AND PLAN:   Principal Problem:   UTI (urinary tract infection) Active Problems:   Essential hypertension   H/O kidney transplant   GERD (gastroesophageal reflux disease)   Confusion   UTI (urinary tract infection) -IV antibiotics initiated, urine culture sent    Confusion -due to UTI as above, to patient's  daughter, she does  have some underlying dementia, monitor for improvement with above treatment   Essential hypertension -continue home dose antihypertensives   H/O kidney transplant -continue home meds   GERD (gastroesophageal reflux disease) -home dose PPI    All the records are reviewed and case discussed with Care Management/Social Workerr. Management plans discussed with the patient, family and they are in agreement.  CODE STATUS: Full.  TOTAL TIME TAKING CARE OF THIS PATIENT: 35 minutes.     POSSIBLE D/C IN *1-2 DAYS, DEPENDING ON CLINICAL CONDITION.   Vaughan Basta M.D on 06/20/2018   Between 7am to 6pm - Pager - [phone]  After 6pm go to [email or web address] - password [email or web address]  Office  [phone]  CC: Primary care physician; Center, [facility]  Note: This dictation was prepared with Dragon dictation along with smaller phrase technology. Any transcriptional errors that result from this process are unintentional.

## 2018-06-21 LAB — HIV ANTIBODY (ROUTINE TESTING W REFLEX): HIV Screen 4th Generation wRfx: NONREACTIVE

## 2018-06-21 MED ORDER — CEPHALEXIN 500 MG PO CAPS: 500.0000 mg | ORAL_CAPSULE | Freq: Two times a day (BID) | ORAL | 0 refills | Status: AC

## 2018-06-21 NOTE — Discharge Summary (Signed)
[facility] NAME: [name]    MR#:  [id number]  DATE OF BIRTH:  05/18/1950  DATE OF ADMISSION:  06/19/2018 ADMITTING PHYSICIAN: Lance Coon, MD  DATE OF DISCHARGE: 06/21/2018   PRIMARY CARE PHYSICIAN: [facility] [place]    ADMISSION DIAGNOSIS:  Confusion [R41.0] Urinary tract infection without hematuria, site unspecified [N39.0]  DISCHARGE DIAGNOSIS:  Principal Problem:   UTI (urinary tract infection) Active Problems:   Essential hypertension   H/O kidney transplant   GERD (gastroesophageal reflux disease)   Confusion   SECONDARY DIAGNOSIS:   Past Medical History:  Diagnosis Date  . Anemia   . Cancer of kidney ([place])   . Dementia ([place])   . GERD (gastroesophageal reflux disease)   . Hypertension   . Renal disorder   . Uterine cancer [facility])     HOSPITAL COURSE:   UTI (urinary tract infection) -IV antibiotics initiated, urine culture sent Reviewed the report, Klebsiella pneumoniae which is sensitive to ceftriaxone and Keflex.  Give oral Keflex on discharge As she has recurrent UTIs per daughter, I would recommend to follow with urology clinic.  Confusion -due to UTI as above, to patient's daughter, she does have some underlying dementia, monitor for improvement with above treatment    She improved much and appears to be baseline. Essential hypertension -continue home dose antihypertensives H/O kidney transplant -continue home meds GERD (gastroesophageal reflux disease) -home dose PPI  Patient was able to walk with the physical therapist in the hospital. DISCHARGE CONDITIONS:   Stable.  CONSULTS OBTAINED:    DRUG ALLERGIES:   Allergies  Allergen Reactions  . Ace Inhibitors Cough  . Lisinopril Cough    DISCHARGE MEDICATIONS:   Allergies as of 06/21/2018      Reactions   Ace Inhibitors Cough   Lisinopril Cough      Medication List    TAKE these medications    atorvastatin 40 MG tablet Commonly known as:  LIPITOR Take 40 mg by mouth at bedtime.   calcitRIOL 0.25 MCG capsule Commonly known as:  ROCALTROL Take 0.25 mcg by mouth daily.   cephALEXin 500 MG capsule Commonly known as:  KEFLEX Take 1 capsule (500 mg total) by mouth 2 (two) times daily for 4 days.   citalopram 20 MG tablet Commonly known as:  CELEXA Take 20 mg by mouth daily.   donepezil 5 MG tablet Commonly known as:  ARICEPT Take 5 mg by mouth 2 (two) times daily.   folic acid 1 MG tablet Commonly known as:  FOLVITE Take 1 mg by mouth 2 (two) times daily.   furosemide 20 MG tablet Commonly known as:  LASIX Take 20 mg by mouth 2 (two) times daily.   Melatonin 10 MG Tabs Take 20 mg by mouth at bedtime.   meloxicam 15 MG tablet Commonly known as:  MOBIC Take 15 mg by mouth daily.   metoprolol succinate 25 MG 24 hr tablet Commonly known as:  TOPROL-XL Take 1 tablet (25 mg total) by mouth daily.   mupirocin ointment 2 % Commonly known as:  BACTROBAN Apply 1 application topically 2 (two) times daily.   mycophenolate 180 MG EC tablet Commonly known as:  MYFORTIC Take 180 mg by mouth 2 (two) times daily.   potassium chloride 10 MEQ tablet Commonly known as:  K-DUR Take 20 mEq by mouth 2 (two) times daily.   sirolimus 1 MG tablet Commonly known as:  RAPAMUNE Take 3 mg  by mouth daily.   Tums Ultra 1000 400 MG chewable tablet Generic drug:  calcium elemental as carbonate Chew 1,000 mg by mouth daily as needed for heartburn.        DISCHARGE INSTRUCTIONS:    Follow with urology clinic in 1 to 2 weeks.  If you experience worsening of your admission symptoms, develop shortness of breath, life threatening emergency, suicidal or homicidal thoughts you must seek medical attention immediately by calling 911 or calling your MD immediately  if symptoms less severe.  You Must read complete instructions/literature along with all the possible adverse  reactions/side effects for all the Medicines you take and that have been prescribed to you. Take any new Medicines after you have completely understood and accept all the possible adverse reactions/side effects.   Please note  You were cared for by a hospitalist during your hospital stay. If you have any questions about your discharge medications or the care you received while you were in the hospital after you are discharged, you can call the unit and asked to speak with the hospitalist on call if the hospitalist that took care of you is not available. Once you are discharged, your primary care physician will handle any further medical issues. Please note that NO REFILLS for any discharge medications will be authorized once you are discharged, as it is imperative that you return to your primary care physician (or establish a relationship with a primary care physician if you do not have one) for your aftercare needs so that they can reassess your need for medications and monitor your lab values.    Today   CHIEF COMPLAINT:   Chief Complaint  Patient presents with  . Altered Mental Status    HISTORY OF PRESENT ILLNESS:  [name]  is a 68 y.o. female presents with chief complaint as above.  Patient brought to the ED by family member for evaluation given altered mental status.  Patient's daughter states that she is more confused than normal, but is unable to give a good description of her baseline.  Here the patient is alert and oriented to person and place but not to time or circumstance.  On evaluation in the ED she was found to have a urinary tract infection.  Hospitalist were called for admission   VITAL SIGNS:  Blood pressure (!) 139/98, pulse 68, temperature 97.8 F (36.6 C), temperature source Oral, resp. rate 17, height 4\' 11"  (1.499 m), weight 112.8 kg, SpO2 100 %.  I/O:    Intake/Output Summary (Last 24 hours) at 06/21/2018 1223 Last data filed at 06/21/2018 1200 Gross per 24  hour  Intake 2300.94 ml  Output 950 ml  Net 1350.94 ml    PHYSICAL EXAMINATION:   GENERAL:  68 y.o.-year-old patient lying in the bed with no acute distress.  EYES: Pupils equal, round, reactive to light and accommodation. No scleral icterus. Extraocular muscles intact.  HEENT: Head atraumatic, normocephalic. Oropharynx and nasopharynx clear.  NECK:  Supple, no jugular venous distention. No thyroid enlargement, no tenderness.  LUNGS: Normal breath sounds bilaterally, no wheezing, rales,rhonchi or crepitation. No use of accessory muscles of respiration.  CARDIOVASCULAR: S1, S2 normal. No murmurs, rubs, or gallops.  ABDOMEN: Soft, nontender, nondistended. Bowel sounds present. No organomegaly or mass.  EXTREMITIES: No pedal edema, cyanosis, or clubbing.  NEUROLOGIC: Cranial nerves II through XII are intact. Muscle strength 4/5 in all extremities. Sensation intact. Gait not checked.  PSYCHIATRIC: The patient is alert and oriented x 2.  SKIN:  No obvious rash, lesion, or ulcer.   DATA REVIEW:   CBC Recent Labs  Lab 06/20/18 0332  WBC 6.9  HGB 8.8*  HCT 28.8*  PLT 155    Chemistries  Recent Labs  Lab 06/19/18 2045 06/20/18 0332  NA 139 141  K 4.1 3.6  CL 103 105  CO2 26 25  GLUCOSE 118* 113*  BUN 28* 24*  CREATININE 1.70* 1.61*  CALCIUM 8.1* 8.2*  AST 20  --   ALT 16  --   ALKPHOS 57  --   BILITOT 0.7  --     Cardiac Enzymes Recent Labs  Lab 06/19/18 2045  TROPONINI <0.03    Microbiology Results  Results for orders placed or performed during the hospital encounter of 06/19/18  Urine culture     Status: Abnormal (Preliminary result)   Collection Time: 06/19/18  8:45 PM  Result Value Ref Range Status   Specimen Description   Final    URINE, RANDOM Performed at [facility], [address], [place], [place] [address]    Special Requests   Final    NONE Performed at [facility], [address], [place], [place] [address]    Culture (A)   Final    >=100,000 COLONIES/mL KLEBSIELLA PNEUMONIAE 80,000 COLONIES/mL GRAM NEGATIVE RODS IDENTIFICATION AND SUSCEPTIBILITIES TO FOLLOW Performed at [facility], [place] [address], [place], [place] [address]    Report Status PENDING  Incomplete   Organism ID, Bacteria KLEBSIELLA PNEUMONIAE (A)  Final      Susceptibility   Klebsiella pneumoniae - MIC*    AMPICILLIN RESISTANT Resistant     CEFAZOLIN <=4 SENSITIVE Sensitive     CEFTRIAXONE <=1 SENSITIVE Sensitive     CIPROFLOXACIN <=0.25 SENSITIVE Sensitive     GENTAMICIN <=1 SENSITIVE Sensitive     IMIPENEM <=0.25 SENSITIVE Sensitive     NITROFURANTOIN 32 SENSITIVE Sensitive     TRIMETH/SULFA <=20 SENSITIVE Sensitive     AMPICILLIN/SULBACTAM 4 SENSITIVE Sensitive     PIP/TAZO <=4 SENSITIVE Sensitive     Extended ESBL NEGATIVE Sensitive     * >=100,000 COLONIES/mL KLEBSIELLA PNEUMONIAE  SARS Coronavirus 2 (CEPHEID - Performed in [place] hospital lab), Hosp Order     Status: None   Collection Time: 06/19/18  9:01 PM  Result Value Ref Range Status   SARS Coronavirus 2 NEGATIVE NEGATIVE Final    Comment: (NOTE) If result is NEGATIVE SARS-CoV-2 target nucleic acids are NOT DETECTED. The SARS-CoV-2 RNA is generally detectable in upper and lower  respiratory specimens during the acute phase of infection. The lowest  concentration of SARS-CoV-2 viral copies this assay can detect is 250  copies / mL. A negative result does not preclude SARS-CoV-2 infection  and should not be used as the sole basis for treatment or other  patient management decisions.  A negative result may occur with  improper specimen collection / handling, submission of specimen other  than nasopharyngeal swab, presence of viral mutation(s) within the  areas targeted by this assay, and inadequate number of viral copies  (<250 copies / mL). A negative result must be combined with clinical  observations, patient history, and epidemiological information. If  result is POSITIVE SARS-CoV-2 target nucleic acids are DETECTED. The SARS-CoV-2 RNA is generally detectable in upper and lower  respiratory specimens dur ing the acute phase of infection.  Positive  results are indicative of active infection with SARS-CoV-2.  Clinical  correlation with patient history and other diagnostic  information is  necessary to determine patient infection status.  Positive results do  not rule out bacterial infection or co-infection with other viruses. If result is PRESUMPTIVE POSTIVE SARS-CoV-2 nucleic acids MAY BE PRESENT.   A presumptive positive result was obtained on the submitted specimen  and confirmed on repeat testing.  While 2019 novel coronavirus  (SARS-CoV-2) nucleic acids may be present in the submitted sample  additional confirmatory testing may be necessary for epidemiological  and / or clinical management purposes  to differentiate between  SARS-CoV-2 and other Sarbecovirus currently known to infect humans.  If clinically indicated additional testing with an alternate test  methodology [phone]) is advised. The SARS-CoV-2 RNA is generally  detectable in upper and lower respiratory sp ecimens during the acute  phase of infection. The expected result is Negative. Fact Sheet for Patients:  StrictlyIdeas.no Fact Sheet for Healthcare Providers: BankingDealers.co.za This test is not yet approved or cleared by the [place] FDA and has been authorized for detection and/or diagnosis of SARS-CoV-2 by FDA under an Emergency Use Authorization (EUA).  This EUA will remain in effect (meaning this test can be used) for the duration of the COVID-19 declaration under Section 564(b)(1) of the Act, 21 U.S.C. section 360bbb-3(b)(1), unless the authorization is terminated or revoked sooner. Performed at [facility], [address] [place], [place] [address]     RADIOLOGY:  Dg Chest Portable 1  View  Result Date: 06/19/2018 CLINICAL DATA:  Altered mental status EXAM: PORTABLE CHEST 1 VIEW COMPARISON:  05/11/2018, 03/22/2018 FINDINGS: Marked cardiomegaly with globular cardiac configuration. Mild central congestion. No focal opacity or pleural effusion. Aortic atherosclerosis. No pneumothorax. Surgical changes at the thoracic inlet. IMPRESSION: Marked cardiomegaly with slight central congestion. Consider pericardial effusion given globular cardiac configuration Electronically Signed   By: Donavan Foil M.D.   On: 06/19/2018 21:32    EKG:   Orders placed or performed during the hospital encounter of 05/11/18  . ED EKG  . ED EKG  . EKG 12-Lead  . EKG 12-Lead      Management plans discussed with the patient, family and they are in agreement.  CODE STATUS: Full.    Code Status Orders  (From admission, onward)         Start     Ordered   06/20/18 0042  Full code  Continuous     06/20/18 0041        Code Status History    Date Active Date Inactive Code Status Order ID Comments User Context   03/23/2018 0357 03/26/2018 1620 Full Code [id number]  Harrie Foreman, MD Inpatient   11/21/2017 0839 11/23/2017 1708 Full Code [id number]  Fritzi Mandes, MD Inpatient   06/01/2017 1402 06/03/2017 1831 Full Code [id number]  Loletha Grayer, MD ED   06/29/2016 2314 07/02/2016 1712 Full Code [id number]  Lance Coon, MD Inpatient   07/06/2014 2201 07/08/2014 1537 Full Code [id number]  Hower, Aaron Mose, MD ED      TOTAL TIME TAKING CARE OF THIS PATIENT: 35 minutes.    Vaughan Basta M.D on 06/21/2018 at 12:23 PM  Between 7am to 6pm - Pager - [phone]  After 6pm go to [email or web address] - password [email or web address] [facility]  Office  [phone]  CC: Primary care physician; [facility]   Note: This dictation was prepared with Dragon dictation along with smaller phrase technology. Any transcriptional errors that result from this process are unintentional.

## 2018-06-21 NOTE — Plan of Care (Signed)

## 2018-06-21 NOTE — Evaluation (Signed)
Physical Therapy Evaluation Patient Details Name: [name] MRN: [id number] DOB: 05/15/1950 Today's Date: 06/21/2018   History of Present Illness  68 yo Female came to ED with AMS diagnosed with UTI; PMH significant for dementia, GERD, HTN, Renal disorder, Uterine and kidney cancer with history of kidney transplant; patient lives at home with her daughter;   Clinical Impression  68 yo Female diagnosed with UTI and AMS has a history of dementia at baseline. She is not oriented x4 but is alert and able to follow single step commands. Patient is currently functioning well. She is mod I for bed mobility. She transferred sit<>stand supervision with and without AD. Patient ambulated 25 feet x2 with RW and without AD exhibiting step through gait pattern. She does require supervision for safety awareness and direction due to confusion but otherwise is functioning well. She reports that she is functioning at her baseline. No additional acute care skill needs identified.    Follow Up Recommendations No PT follow up    Equipment Recommendations  None recommended by PT    Recommendations for Other Services       Precautions / Restrictions Precautions Precautions: Fall Restrictions Weight Bearing Restrictions: No      Mobility  Bed Mobility Overal bed mobility: Modified Independent             General bed mobility comments: able to sit edge of bed with elevated head of bed and bed rails without difficulty;   Transfers Overall transfer level: Needs assistance Equipment used: Rolling walker (2 wheeled);None Transfers: Sit to/from Stand Sit to Stand: Supervision         General transfer comment: supervision for safety; patient transferred sit<>stand from bed with RW with min VCs for hand placement; pt transferred sit<>stand from commode with grab bar, supervision; transferred to chair without AD supervision for safety awareness; patient reports minimal difficulty with transfers    Ambulation/Gait Ambulation/Gait assistance: Supervision Gait Distance (Feet): 25 Feet(25 feet x2 (with and without AD)) Assistive device: Rolling walker (2 wheeled);None Gait Pattern/deviations: Step-through pattern Gait velocity: slightly decreased, although patient reports she is walking at her baseline;    General Gait Details: patient ambulated with RW x25 feet requiring supervision and min VCs for walker placement and management; She walked 25 feet without AD, supervision for direction and safety awareness; patient exhibits good step through gait pattern with good foot placement and control;   Stairs            Wheelchair Mobility    Modified Rankin (Stroke Patients Only)       Balance Overall balance assessment: Needs assistance Sitting-balance support: No upper extremity supported;Feet supported Sitting balance-Leahy Scale: Normal     Standing balance support: No upper extremity supported;During functional activity Standing balance-Leahy Scale: Good Standing balance comment: able to stand feet together eyes closed, no loss of balance; able to keep balance against moderate pertubations;                              Pertinent Vitals/Pain Pain Assessment: No/denies pain    Home Living Family/patient expects to be discharged to:: Private residence Living Arrangements: Children Available Help at Discharge: Available PRN/intermittently Type of Home: Apartment Home Access: Stairs to enter Entrance Stairs-Rails: Right;Left;Can reach both Entrance Stairs-Number of Steps: pt reporting a few but cannot identify exactly how many, last PT eval indicating 2 Home Layout: One level Home Equipment: Walker - 2 wheels;Cane - single point Additional  Comments: Pt lives with her daughter; her daughter is gone during the day at work; pt reports using [place] intermittently for ambulation; no recent falls reported;     Prior Function Level of Independence: Independent with  assistive device(s)         Comments: used SPC intermittently; pt reports that she does the cooking/cleaning although unsure of accuracy as patient is not oriented to date, person, place or situation;      Hand Dominance   Dominant Hand: Right    Extremity/Trunk Assessment   Upper Extremity Assessment Upper Extremity Assessment: Overall WFL for tasks assessed    Lower Extremity Assessment Lower Extremity Assessment: Overall WFL for tasks assessed    Cervical / Trunk Assessment Cervical / Trunk Assessment: Normal  Communication   Communication: No difficulties  Cognition Arousal/Alertness: Awake/alert Behavior During Therapy: WFL for tasks assessed/performed Overall Cognitive Status: Difficult to assess                                 General Comments: has dementia with history of cognitive impairments; unsure of baseline; patient not oriented to date, place, person or situation;       General Comments      Exercises     Assessment/Plan    PT Assessment Patent does not need any further PT services  PT Problem List         PT Treatment Interventions      PT Goals (Current goals can be found in the Care Plan section)  Acute Rehab PT Goals Patient Stated Goal: to go home PT Goal Formulation: With patient Time For Goal Achievement: 06/21/18 Potential to Achieve Goals: Good    Frequency     Barriers to discharge        Co-evaluation               AM-PAC PT "6 Clicks" Mobility  Outcome Measure Help needed turning from your back to your side while in a flat bed without using bedrails?: None Help needed moving from lying on your back to sitting on the side of a flat bed without using bedrails?: None Help needed moving to and from a bed to a chair (including a wheelchair)?: None Help needed standing up from a chair using your arms (e.g., wheelchair or bedside chair)?: None Help needed to walk in hospital room?: None Help needed  climbing 3-5 steps with a railing? : A Little 6 Click Score: 23    End of Session Equipment Utilized During Treatment: Gait belt Activity Tolerance: Patient tolerated treatment well;No increased pain Patient left: in chair;with call bell/phone within reach;with chair alarm set Nurse Communication: Mobility status PT Visit Diagnosis: Difficulty in walking, not elsewhere classified (R26.2)    Time: 6295-2841 PT Time Calculation (min) (ACUTE ONLY): 36 min   Charges:   PT Evaluation $PT Eval Low Complexity: 1 Low            Micheal Sheen PT, DPT 06/21/2018, 12:29 PM

## 2018-06-21 NOTE — Progress Notes (Signed)
Patient to be discharged home today under the care of her family. Patient will take PO antibiotics X 4 days for the UTI for which she was admitted. Patient's bottle of home medications, Sacrolimus, was retrieved from the [place] by this RN and returned to the patient. Discharge instructions were provided to the patient's daughter by this RN over the phone. All questions were answered. Patient's meds and D/C instructions will be handed to the patient's spouse, who is scheduled to pick-up the patient from here at 1300 hrs today.

## 2018-06-22 LAB — URINE CULTURE: Culture: >=100000 — AB

## 2018-08-01 ENCOUNTER — Emergency Department
Admission: EM | Admit: 2018-08-01 | Discharge: 2018-08-01 | Disposition: A | Discharge status: Home / Self Care | Payer: Medicare Other | Attending: Emergency Medicine | Admitting: Emergency Medicine

## 2018-08-01 ENCOUNTER — Emergency Department: Payer: Medicare Other

## 2018-08-01 ENCOUNTER — Other Ambulatory Visit: Payer: Self-pay

## 2018-08-01 ENCOUNTER — Encounter: Payer: Self-pay | Admitting: Emergency Medicine

## 2018-08-01 DIAGNOSIS — F0390 Unspecified dementia without behavioral disturbance: Secondary | ICD-10-CM | POA: Diagnosis not present

## 2018-08-01 DIAGNOSIS — W19XXXA Unspecified fall, initial encounter: Secondary | ICD-10-CM | POA: Diagnosis not present

## 2018-08-01 DIAGNOSIS — S40012A Contusion of left shoulder, initial encounter: Secondary | ICD-10-CM | POA: Diagnosis not present

## 2018-08-01 DIAGNOSIS — Y92013 Bedroom of single-family (private) house as the place of occurrence of the external cause: Secondary | ICD-10-CM | POA: Diagnosis not present

## 2018-08-01 DIAGNOSIS — Z87891 Personal history of nicotine dependence: Secondary | ICD-10-CM | POA: Insufficient documentation

## 2018-08-01 DIAGNOSIS — Y939 Activity, unspecified: Secondary | ICD-10-CM | POA: Insufficient documentation

## 2018-08-01 DIAGNOSIS — Y998 Other external cause status: Secondary | ICD-10-CM | POA: Diagnosis not present

## 2018-08-01 DIAGNOSIS — S4992XA Unspecified injury of left shoulder and upper arm, initial encounter: Secondary | ICD-10-CM | POA: Diagnosis present

## 2018-08-01 DIAGNOSIS — Z940 Kidney transplant status: Secondary | ICD-10-CM | POA: Insufficient documentation

## 2018-08-01 DIAGNOSIS — I10 Essential (primary) hypertension: Secondary | ICD-10-CM | POA: Insufficient documentation

## 2018-08-01 DIAGNOSIS — N390 Urinary tract infection, site not specified: Secondary | ICD-10-CM | POA: Diagnosis not present

## 2018-08-01 DIAGNOSIS — Z79899 Other long term (current) drug therapy: Secondary | ICD-10-CM | POA: Diagnosis not present

## 2018-08-01 DIAGNOSIS — Z85528 Personal history of other malignant neoplasm of kidney: Secondary | ICD-10-CM | POA: Insufficient documentation

## 2018-08-01 LAB — CBC WITH DIFFERENTIAL/PLATELET
Abs Immature Granulocytes: 0.02 10*3/uL (ref 0.00–0.07)
Basophils Absolute: 0 10*3/uL (ref 0.0–0.1)
Basophils Relative: 1 %
Eosinophils Absolute: 0.1 10*3/uL (ref 0.0–0.5)
Eosinophils Relative: 1 %
HCT: 28.5 % — ABNORMAL LOW (ref 36.0–46.0)
Hemoglobin: 8.7 g/dL — ABNORMAL LOW (ref 12.0–15.0)
Immature Granulocytes: 0 %
Lymphocytes Relative: 15 %
Lymphs Abs: 0.8 10*3/uL (ref 0.7–4.0)
MCH: 24.6 pg — ABNORMAL LOW (ref 26.0–34.0)
MCHC: 30.5 g/dL (ref 30.0–36.0)
MCV: 80.7 fL (ref 80.0–100.0)
Monocytes Absolute: 0.8 10*3/uL (ref 0.1–1.0)
Monocytes Relative: 16 %
Neutro Abs: 3.4 10*3/uL (ref 1.7–7.7)
Neutrophils Relative %: 67 %
Platelets: 168 10*3/uL (ref 150–400)
RBC: 3.53 MIL/uL — ABNORMAL LOW (ref 3.87–5.11)
RDW: 15.2 % (ref 11.5–15.5)
WBC: 5.1 10*3/uL (ref 4.0–10.5)
nRBC: 0 % (ref 0.0–0.2)

## 2018-08-01 LAB — TROPONIN I: Troponin I: 0.03 ng/mL (ref <0.03)

## 2018-08-01 LAB — URINALYSIS, COMPLETE (UACMP) WITH MICROSCOPIC
Bilirubin Urine: NEGATIVE
Glucose, UA: NEGATIVE mg/dL
Ketones, ur: NEGATIVE mg/dL
Nitrite: POSITIVE — AB
Protein, ur: 30 mg/dL — AB
Specific Gravity, Urine: 1.014 (ref 1.005–1.030)
WBC, UA: >50 WBC/hpf — ABNORMAL HIGH (ref 0–5)
pH: 6 (ref 5.0–8.0)

## 2018-08-01 LAB — COMPREHENSIVE METABOLIC PANEL
ALT: 13 U/L (ref 0–44)
AST: 18 U/L (ref 15–41)
Albumin: 3.3 g/dL — ABNORMAL LOW (ref 3.5–5.0)
Alkaline Phosphatase: 50 U/L (ref 38–126)
Anion gap: 10 (ref 5–15)
BUN: 28 mg/dL — ABNORMAL HIGH (ref 8–23)
CO2: 24 mmol/L (ref 22–32)
Calcium: 8.2 mg/dL — ABNORMAL LOW (ref 8.9–10.3)
Chloride: 105 mmol/L (ref 98–111)
Creatinine, Ser: 1.85 mg/dL — ABNORMAL HIGH (ref 0.44–1.00)
GFR calc Af Amer: 32 mL/min — ABNORMAL LOW (ref >60)
GFR calc non Af Amer: 28 mL/min — ABNORMAL LOW (ref >60)
Glucose, Bld: 112 mg/dL — ABNORMAL HIGH (ref 70–99)
Potassium: 3.8 mmol/L (ref 3.5–5.1)
Sodium: 139 mmol/L (ref 135–145)
Total Bilirubin: 0.4 mg/dL (ref 0.3–1.2)
Total Protein: 6.8 g/dL (ref 6.5–8.1)

## 2018-08-01 LAB — CK: Total CK: 174 U/L (ref 38–234)

## 2018-08-01 MED ORDER — CEFDINIR 300 MG PO CAPS: 300.0000 mg | ORAL_CAPSULE | Freq: Two times a day (BID) | ORAL | 0 refills | Status: DC

## 2018-08-01 MED ORDER — SODIUM CHLORIDE 0.9 % IV SOLN
1.0000 g | Freq: Once | INTRAVENOUS | Status: AC
  Administered 2018-08-01: 18:00:00 1 g via INTRAVENOUS
  Filled 2018-08-01: qty 10

## 2018-08-01 NOTE — Discharge Instructions (Addendum)
Follow-up with your regular doctor as needed.  Return emergency department as needed.  Take medication as prescribed.  We have ordered a urine culture to ensure you are on the proper antibiotic.

## 2018-08-01 NOTE — ED Notes (Signed)
Pt placed on bedpan

## 2018-08-01 NOTE — ED Notes (Signed)
Unable to obtain urine sample at this time.

## 2018-08-01 NOTE — ED Provider Notes (Signed)
[facility] Emergency Department Provider Note  ____________________________________________   None    (approximate)  I have reviewed the triage vital signs and the nursing notes.   HISTORY  Chief Complaint Fall    HPI [name] is a 68 y.o. female with history of dementia and kidney transplant presents to the emergency department via EMS.  The daughter states that her mother was found on the floor by her aide this morning.  She had fallen out of bed last night.  There was urine all over the carpet.  She does live with her daughter and the aide comes daily.  She states the mother did not call out for her during the night.  The patient has a bruise on the left shoulder.  She has dementia and cannot answer my questions appropriately.  Poor historian.    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 Authorizing Provider  atorvastatin (LIPITOR) 40 MG tablet Take 40 mg by mouth at bedtime.    [provider]  calcitRIOL (ROCALTROL) 0.25 MCG capsule Take 0.25  mcg by mouth daily.    [provider]  calcium elemental as carbonate (TUMS ULTRA 1000) 400 MG chewable tablet Chew 1,000 mg by mouth daily as needed for heartburn.    [provider]  cefdinir (OMNICEF) 300 MG capsule Take 1 capsule (300 mg total) by mouth 2 (two) times daily. 08/01/18   Deontre Allsup, [name] Dolin, PA-C  citalopram (CELEXA) 20 MG tablet Take 20 mg by mouth daily.    [provider]  donepezil (ARICEPT) 5 MG tablet Take 5 mg by mouth 2 (two) times daily.  04/05/16   [provider]  folic acid (FOLVITE) 1 MG tablet Take 1 mg by mouth 2 (two) times daily.     [provider]  furosemide (LASIX) 20 MG tablet Take 20 mg by mouth 2 (two) times daily.    [provider]  Melatonin 10 MG TABS Take 20 mg by mouth at bedtime.    [provider]  meloxicam (MOBIC) 15 MG tablet Take 15 mg by mouth daily. 06/14/18   [provider]  metoprolol succinate (TOPROL-XL) 25 MG 24 hr tablet Take 1 tablet (25 mg total) by mouth daily. 07/02/16   Gladstone Lighter, MD  mupirocin ointment (BACTROBAN) 2 % Apply 1 application topically 2 (two) times daily. 05/30/18   [provider]  mycophenolate (MYFORTIC) 180 MG EC tablet Take 180 mg by  mouth 2 (two) times daily.    [provider]  potassium chloride (K-DUR) 10 MEQ tablet Take 20 mEq by mouth 2 (two) times daily. 01/26/18   [provider]  sirolimus (RAPAMUNE) 1 MG tablet Take 3 mg by mouth daily.     [provider]    Allergies Ace inhibitors and Lisinopril  Family History  Problem Relation Age of Onset   Stroke Mother    Alcohol abuse Father    Breast cancer Neg Hx     Social History Social History   Tobacco Use   Smoking status: Former Smoker   Smokeless tobacco: Never Used  Substance Use Topics   Alcohol use: No   Drug use: No    Review of Systems  Constitutional: No fever/chills, questionable head injury due to fall Eyes:  No visual changes. ENT: No sore throat. Respiratory: Denies cough Genitourinary: Negative for dysuria. Musculoskeletal: Negative for back pain.  Positive left shoulder injury Skin: Negative for rash.    ____________________________________________   PHYSICAL EXAM:  VITAL SIGNS: ED Triage Vitals  Enc Vitals Group     BP 08/01/18 1215 (!) 148/93     Pulse Rate 08/01/18 1215 95     Resp 08/01/18 1215 16     Temp 08/01/18 1215 98.2 F (36.8 C)     Temp Source 08/01/18 1215 Oral     SpO2 08/01/18 1215 100 %     Weight 08/01/18 1216 246 lb 14.6 oz (112 kg)     Height 08/01/18 1216 4\' 11"  (1.499 m)     Head Circumference --      Peak Flow --      Pain Score 08/01/18 1216 0     Pain Loc --      Pain Edu? --      Excl. in [place]? --     Constitutional: Alert , positive for dementia,. Well appearing and in no acute distress. Eyes: Conjunctivae are normal.  Head: Atraumatic. Nose: No congestion/rhinnorhea. Mouth/Throat: Mucous membranes are moist.   Neck:  supple no lymphadenopathy noted Cardiovascular: Normal rate, regular rhythm. Heart sounds are normal Respiratory: Normal respiratory effort.  No retractions, lungs c t a  Abd: soft nontender bs normal all 4 quad GU: deferred Musculoskeletal: FROM all extremities, warm and well perfused, bruise noted on the left shoulder Neurologic:  Normal speech and language.  Skin:  Skin is warm, dry and intact. No rash noted. Psychiatric: Mood and affect are normal. Speech and behavior are normal.  ____________________________________________   LABS (all labs ordered are listed, but only abnormal results are displayed)  Labs Reviewed  COMPREHENSIVE METABOLIC PANEL - Abnormal; Notable for the following components:      Result Value   Glucose, Bld 112 (*)    BUN 28 (*)    Creatinine, Ser 1.85 (*)    Calcium 8.2 (*)    Albumin 3.3 (*)    GFR calc non Af Amer 28 (*)    GFR calc Af Amer 32 (*)    All other components within normal  limits  TROPONIN I - Abnormal; Notable for the following components:   Troponin I 0.03 (*)    All other components within normal limits  CBC WITH DIFFERENTIAL/PLATELET - Abnormal; Notable for the following components:   RBC 3.53 (*)    Hemoglobin 8.7 (*)    HCT 28.5 (*)    MCH 24.6 (*)    All other components within normal limits  URINALYSIS, COMPLETE (UACMP) WITH  MICROSCOPIC - Abnormal; Notable for the following components:   Color, Urine YELLOW (*)    APPearance CLOUDY (*)    Hgb urine dipstick SMALL (*)    Protein, ur 30 (*)    Nitrite POSITIVE (*)    Leukocytes,Ua LARGE (*)    WBC, UA >50 (*)    Bacteria, UA RARE (*)    All other components within normal limits  URINE CULTURE  CK   ____________________________________________   ____________________________________________  RADIOLOGY  CT of the head negative Chest x-ray is normal X-ray of the left shoulder is normal ____________________________________________   PROCEDURES  Procedure(s) performed: Rocephin 1 g IV   Procedures    ____________________________________________   INITIAL IMPRESSION / ASSESSMENT AND PLAN / ED COURSE  Pertinent labs & imaging results that were available during my care of the patient were reviewed by me and considered in my medical decision making (see chart for details).   Patient 68 year old female presents emergency department via EMS.  Patient has history of dementia.  Information was gathered from her daughter via phone. Patient was on the floor this morning after falling out of bed.  Unsure of how long she has been on the floor.  The carpet was soaked with urine.  Physical exam the patient is not oriented.  She does answer questions but they seem to be erratic.  Bruise noted on the left shoulder.  X-ray of the left shoulder, chest x-ray, CT of the head  EKG shows sinus rhythm with ventricular trigeminy  Labs for CBC, comprehensive metabolic panel, CK total, troponin, and  UA were ordered  ----------------------------------------- 5:02 PM on 08/01/2018 ----------------------------------------- CBC is at the patient's norm, troponin was slightly elevated at 0.03 , comprehensive metabolic panel has elevated values but are the patient's norm, CK total is 174 which is normal, urinalysis shows nitrite positive large amount of leuks, greater than 50 WBCs and rare bacteria.  Urine culture was added.  Discussed with Dr. Burlene Arnt and Dr. Quentin Cornwall.  Patient be given Rocephin 1 g IV. Called patient's daughter.  She agrees she would like to take her mother home.  They have home health daily and she also lives with her.  She states as long as we give her an antibiotic for the UTI she is fine with taking her mother home.  The patient was given a prescription for Omnicef.  She will be discharged after the Rocephin .  Patient was discharged in stable condition.    As part of my medical decision making, I reviewed the following data within the [place] History obtained from family, Nursing notes reviewed and incorporated, Labs reviewed see above, EKG interpreted NSR, Old chart reviewed, Radiograph reviewed T the head is negative, x-ray left shoulder is negative, chest x-ray is normal, Evaluated by EM attending Dr. Burlene Arnt, Notes from prior ED visits and [place] Controlled Substance Database  ____________________________________________   FINAL CLINICAL IMPRESSION(S) / ED DIAGNOSES  Final diagnoses:  Urinary tract infection without hematuria, site unspecified  Fall, initial encounter      NEW MEDICATIONS STARTED DURING THIS VISIT:  New Prescriptions   CEFDINIR (OMNICEF) 300 MG CAPSULE    Take 1 capsule (300 mg total) by mouth 2 (two) times daily.     Note:  This document was prepared using Dragon voice recognition software and may include unintentional dictation errors.    Versie Starks, PA-C 08/01/18 Johnnye Lana    Schuyler Amor, MD 08/02/18 1032

## 2018-08-01 NOTE — ED Notes (Signed)
Date and time results received: 08/01/18 1:42 PM (use smartphrase ".now" to insert current time)  Test: Troponin Critical Value: 0.03  Name of Provider Notified: Ashok Cordia, PA  Orders Received? Or Actions Taken?: No new orders at this time.

## 2018-08-01 NOTE — ED Triage Notes (Signed)
Pt to ED from home via EMS c/o fall yesterday and today per EMS.  Pt with hx of dementia, oriented to self, place, disoriented to time and situation.  Patient does not remember falls when asked, denies pain at this time.  EMS reports bruise to left shoulder from fall.  Pt fell from bed to floor today and was found today on ground by home health nurse.  Hx of dementia, CHF, left arm fistula.

## 2018-08-03 LAB — URINE CULTURE: Culture: >=100000 — AB

## 2018-08-04 ENCOUNTER — Encounter: Payer: Self-pay | Admitting: Emergency Medicine

## 2018-08-04 ENCOUNTER — Emergency Department
Admission: EM | Admit: 2018-08-04 | Discharge: 2018-08-04 | Disposition: A | Discharge status: Home / Self Care | Payer: Medicare Other | Attending: Emergency Medicine | Admitting: Emergency Medicine

## 2018-08-04 DIAGNOSIS — N390 Urinary tract infection, site not specified: Secondary | ICD-10-CM | POA: Insufficient documentation

## 2018-08-04 DIAGNOSIS — Z87891 Personal history of nicotine dependence: Secondary | ICD-10-CM | POA: Diagnosis not present

## 2018-08-04 DIAGNOSIS — Z940 Kidney transplant status: Secondary | ICD-10-CM | POA: Diagnosis not present

## 2018-08-04 DIAGNOSIS — R197 Diarrhea, unspecified: Secondary | ICD-10-CM

## 2018-08-04 DIAGNOSIS — I10 Essential (primary) hypertension: Secondary | ICD-10-CM | POA: Insufficient documentation

## 2018-08-04 DIAGNOSIS — Z8051 Family history of malignant neoplasm of kidney: Secondary | ICD-10-CM | POA: Insufficient documentation

## 2018-08-04 DIAGNOSIS — F0390 Unspecified dementia without behavioral disturbance: Secondary | ICD-10-CM | POA: Insufficient documentation

## 2018-08-04 DIAGNOSIS — Z79899 Other long term (current) drug therapy: Secondary | ICD-10-CM | POA: Diagnosis not present

## 2018-08-04 LAB — COMPREHENSIVE METABOLIC PANEL
ALT: 19 U/L (ref 0–44)
AST: 26 U/L (ref 15–41)
Albumin: 3.2 g/dL — ABNORMAL LOW (ref 3.5–5.0)
Alkaline Phosphatase: 53 U/L (ref 38–126)
Anion gap: 13 (ref 5–15)
BUN: 34 mg/dL — ABNORMAL HIGH (ref 8–23)
CO2: 22 mmol/L (ref 22–32)
Calcium: 8.4 mg/dL — ABNORMAL LOW (ref 8.9–10.3)
Chloride: 106 mmol/L (ref 98–111)
Creatinine, Ser: 1.88 mg/dL — ABNORMAL HIGH (ref 0.44–1.00)
GFR calc Af Amer: 31 mL/min — ABNORMAL LOW (ref >60)
GFR calc non Af Amer: 27 mL/min — ABNORMAL LOW (ref >60)
Glucose, Bld: 101 mg/dL — ABNORMAL HIGH (ref 70–99)
Potassium: 3.7 mmol/L (ref 3.5–5.1)
Sodium: 141 mmol/L (ref 135–145)
Total Bilirubin: 0.3 mg/dL (ref 0.3–1.2)
Total Protein: 7 g/dL (ref 6.5–8.1)

## 2018-08-04 LAB — CBC
HCT: 29.6 % — ABNORMAL LOW (ref 36.0–46.0)
Hemoglobin: 8.7 g/dL — ABNORMAL LOW (ref 12.0–15.0)
MCH: 24.2 pg — ABNORMAL LOW (ref 26.0–34.0)
MCHC: 29.4 g/dL — ABNORMAL LOW (ref 30.0–36.0)
MCV: 82.5 fL (ref 80.0–100.0)
Platelets: 176 10*3/uL (ref 150–400)
RBC: 3.59 MIL/uL — ABNORMAL LOW (ref 3.87–5.11)
RDW: 15.3 % (ref 11.5–15.5)
WBC: 4.7 10*3/uL (ref 4.0–10.5)
nRBC: 0 % (ref 0.0–0.2)

## 2018-08-04 LAB — URINALYSIS, COMPLETE (UACMP) WITH MICROSCOPIC
Bilirubin Urine: NEGATIVE
Glucose, UA: NEGATIVE mg/dL
Ketones, ur: NEGATIVE mg/dL
Nitrite: NEGATIVE
Protein, ur: NEGATIVE mg/dL
Specific Gravity, Urine: 1.008 (ref 1.005–1.030)
pH: 5 (ref 5.0–8.0)

## 2018-08-04 LAB — C DIFFICILE QUICK SCREEN W PCR REFLEX
C Diff antigen: NEGATIVE
C Diff interpretation: NOT DETECTED
C Diff toxin: NEGATIVE

## 2018-08-04 MED ORDER — SODIUM CHLORIDE 0.9 % IV BOLUS
1000.0000 mL | Freq: Once | INTRAVENOUS | Status: AC
  Administered 2018-08-04: 1000 mL via INTRAVENOUS

## 2018-08-04 MED ORDER — CIPROFLOXACIN HCL 500 MG PO TABS: 500.0000 mg | ORAL_TABLET | Freq: Two times a day (BID) | ORAL | 0 refills | Status: DC

## 2018-08-04 NOTE — ED Notes (Signed)
Pt verbalized understanding of discharge instructions. Discharge instructions reviewed with daughter. NAD at this time.

## 2018-08-04 NOTE — ED Provider Notes (Signed)
[facility] Emergency Department Provider Note  Time seen: 1:24 PM  I have reviewed the triage vital signs and the nursing notes.   HISTORY  Chief Complaint Diarrhea   HPI [name] is a 68 y.o. female with a past medical history of dementia, gastric reflux, hypertension, presents to the emergency department for diarrhea.  According to the patient's daughter who I spoke to on the phone she has been experiencing diarrhea since last night, states she was on the toilet for an hour this morning due to significant amounts of diarrhea.  Denies any known fever at any point.  Currently the patient appears well, she is calm cooperative and pleasant.  Patient has dementia and cannot contribute to her history or review of systems.   Past Medical History:  Diagnosis Date  . Anemia   . Cancer of kidney ([place])   . Dementia ([place])   . GERD (gastroesophageal reflux disease)   . Hypertension   . Renal disorder   . Uterine cancer [facility])     Patient Active Problem List   Diagnosis Date Noted  . Confusion 03/23/2018  . Hypokalemia 06/01/2017  . Sepsis ([name]) 06/29/2016  . UTI (urinary tract infection) 06/29/2016  . GERD (gastroesophageal reflux disease) 06/29/2016  . Dementia ([place]) 06/29/2016  . 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 Essential hypertension 07/06/2014  . H/O kidney transplant 07/06/2014    Past Surgical History:  Procedure Laterality Date  . ABDOMINAL HYSTERECTOMY    . AV FISTULA PLACEMENT    . BREAST BIOPSY     Patient is unsure but she thinks she remember a Bx  . BREAST BIOPSY Right 2017   benign  . KIDNEY TRANSPLANT    . NEPHRECTOMY TRANSPLANTED ORGAN      Prior to Admission medications   Medication Sig Start Date End Date Taking? Authorizing Provider  atorvastatin (LIPITOR) 40 MG tablet Take 40 mg by mouth at bedtime.    [provider]  calcitRIOL (ROCALTROL) 0.25 MCG capsule Take 0.25 mcg by mouth daily.    [provider]  calcium elemental as carbonate (TUMS ULTRA 1000) 400 MG chewable tablet Chew 1,000 mg by mouth daily as needed for heartburn.    [provider]  cefdinir (OMNICEF) 300 MG capsule Take 1 capsule (300 mg total) by mouth 2 (two) times daily. 08/01/18   Fisher, [name] Dolin, PA-C  citalopram (CELEXA) 20 MG tablet Take 20 mg by mouth daily.    [provider]  donepezil (ARICEPT) 5 MG tablet Take 5 mg by mouth 2 (two) times daily.  04/05/16   [provider]  folic acid (FOLVITE) 1 MG tablet Take 1 mg by mouth 2 (two) times daily.     [provider]  furosemide (LASIX) 20 MG tablet Take 20 mg by mouth 2 (two) times daily.    [provider]  Melatonin 10 MG TABS Take 20 mg by mouth at bedtime.    [provider]  meloxicam (MOBIC) 15 MG tablet Take 15 mg by mouth daily. 06/14/18   [provider]  metoprolol succinate (TOPROL-XL) 25 MG 24 hr tablet Take 1 tablet (25 mg total) by mouth daily. 07/02/16   Gladstone Lighter, MD  mupirocin ointment (BACTROBAN) 2 % Apply 1 application topically 2 (two) times daily. 05/30/18   [provider]  mycophenolate (MYFORTIC) 180 MG EC tablet Take 180 mg by mouth 2 (two) times daily.    [provider]  potassium chloride (K-DUR)  10 MEQ tablet Take 20 mEq by mouth 2 (two) times daily. 01/26/18   [provider]  sirolimus (RAPAMUNE) 1 MG tablet Take 3 mg by mouth daily.     [provider]    Allergies  Allergen Reactions  . Ace Inhibitors Cough  . Lisinopril Cough    Family History  Problem Relation Age of Onset  . Stroke Mother   . Alcohol abuse Father   . Breast cancer Neg Hx     Social History Social History   Tobacco Use  . Smoking status: Former Research scientist (life sciences)  . Smokeless tobacco: Never Used  Substance Use Topics  . Alcohol use: No  . Drug use: No    Review of Systems Unable to obtain adequate/accurate review of systems secondary to  baseline dementia  ____________________________________________   PHYSICAL EXAM:  VITAL SIGNS: ED Triage Vitals [08/04/18 1258]  Enc Vitals Group     BP      Pulse      Resp      Temp      Temp src      SpO2      Weight      Height 4\' 11"  (1.499 m)     Head Circumference      Peak Flow      Pain Score 0     Pain Loc      Pain Edu?      Excl. in [place]?     Constitutional: Awake alert, calm cooperative and pleasant.  No distress. Eyes: Normal exam ENT      Head: Normocephalic and atraumatic.      Mouth/Throat: Mucous membranes are moist. Cardiovascular: Normal rate, regular rhythm. No murmur Respiratory: Normal respiratory effort without tachypnea nor retractions. Breath sounds are clear  Gastrointestinal: Soft and nontender. No distention.   Musculoskeletal: Nontender with normal range of motion in all extremities.  Neurologic:  Normal speech and language. No gross focal neurologic deficits  Skin:  Skin is warm, dry and intact.  Psychiatric: Mood and affect are normal.  ____________________________________________  EKG viewed and interpreted by myself shows a normal sinus rhythm at 72 bpm with a narrow QRS, normal axis, normal intervals, no concerning ST changes   INITIAL IMPRESSION / ASSESSMENT AND PLAN / ED COURSE  Pertinent labs & imaging results that were available during my care of the patient were reviewed by me and considered in my medical decision making (see chart for details).   Patient presents to the emergency department for diarrhea, lives at home with her daughter.  States diarrhea since last night.  Patient was seen in the emergency department 08/01/2018 diagnosed with urinary tract infection and placed on antibiotics.  Patient has a benign abdominal exam.  Differential this time would include antibiotic induced diarrhea, enteritis, C. difficile colitis, gastroenteritis.  We will check labs, IV hydrate attempt to obtain a stool sample for C. difficile and  biofire testing.  Patient and daughter agreeable to plan of care.  EKG shows no acute abnormality.  Patient's lab work is largely at baseline.  Renal insufficiency is baseline for the patient.  Awaiting stool sample and urinalysis.  Patient care signed out to oncoming physician.  Jlynn Ly was evaluated in Emergency Department on 08/04/2018 for the symptoms described in the history of present illness. She was evaluated in the context of the global COVID-19 pandemic, which necessitated consideration that the patient might be at risk for infection with the SARS-CoV-2 virus that causes COVID-19. Institutional protocols  and algorithms that pertain to the evaluation of patients at risk for COVID-19 are in a state of rapid change based on information released by regulatory bodies including the CDC and federal and state organizations. These policies and algorithms were followed during the patient's care in the ED.  ____________________________________________   FINAL CLINICAL IMPRESSION(S) / ED DIAGNOSES  Diarrhea   Harvest Dark, MD 08/04/18 1422

## 2018-08-04 NOTE — ED Triage Notes (Signed)
Pt to ED by EMS from home with c/o of diarrhea for 1 week. Pt recently seen for UTI and prescribed antibiotics.

## 2018-08-04 NOTE — Discharge Instructions (Signed)
You have been seen in the Emergency Department (ED) today loose stools and diarrhea.  Suspect this may be related to your antibiotic.  Will change you to a different antibiotic, please discontinue cefdinir.  Start ciprofloxacin.   Call your regular doctor to schedule the next available appointment to follow up on todays ED visit, or return immediately to the ED if your pain worsens, you have decreased urine production, develop fever, persistent vomiting, or other symptoms that concern you.

## 2018-08-04 NOTE — ED Provider Notes (Signed)
Vitals:   08/04/18 1600 08/04/18 1921  BP: (!) 177/99 (!) 182/80  Pulse:  68  Resp: 15 16  SpO2:  100%     ----------------------------------------- 7:41 PM on 08/04/2018 -----------------------------------------  Patient very pleasant, resting comfortably without distress.  She did have one stool that was collected negative for C. difficile.  No further stooling or diarrhea.  She appears well, hydrated in no acute distress without signs or symptoms of decompensation or sepsis.  I do suspect her loose stools may related to her antibiotic use.  We will change her from cefdinir to ciprofloxacin for treatment of urinary tract infection.  Return precautions and treatment recommendations and follow-up discussed with the patient and her daughter [name]) who is agreeable with the plan. Family will pick her up (patient's husband).     Delman Kitten, MD 08/04/18 1946

## 2018-08-07 LAB — GASTROINTESTINAL PANEL BY PCR, STOOL (REPLACES STOOL CULTURE)

## 2018-08-12 ENCOUNTER — Other Ambulatory Visit: Payer: Self-pay

## 2018-08-12 ENCOUNTER — Emergency Department: Payer: Medicare Other

## 2018-08-12 ENCOUNTER — Inpatient Hospital Stay
Admission: EM | Admit: 2018-08-12 | Discharge: 2018-08-15 | DRG: 699 | Disposition: A | Discharge status: Home Health | Payer: Medicare Other | Attending: Internal Medicine | Admitting: Internal Medicine

## 2018-08-12 DIAGNOSIS — D638 Anemia in other chronic diseases classified elsewhere: Secondary | ICD-10-CM | POA: Diagnosis present

## 2018-08-12 DIAGNOSIS — T8619 Other complication of kidney transplant: Principal | ICD-10-CM | POA: Diagnosis present

## 2018-08-12 DIAGNOSIS — Z888 Allergy status to other drugs, medicaments and biological substances status: Secondary | ICD-10-CM | POA: Diagnosis not present

## 2018-08-12 DIAGNOSIS — Z1159 Encounter for screening for other viral diseases: Secondary | ICD-10-CM | POA: Diagnosis not present

## 2018-08-12 DIAGNOSIS — K219 Gastro-esophageal reflux disease without esophagitis: Secondary | ICD-10-CM | POA: Diagnosis present

## 2018-08-12 DIAGNOSIS — Z940 Kidney transplant status: Secondary | ICD-10-CM

## 2018-08-12 DIAGNOSIS — N3090 Cystitis, unspecified without hematuria: Secondary | ICD-10-CM | POA: Diagnosis present

## 2018-08-12 DIAGNOSIS — R197 Diarrhea, unspecified: Secondary | ICD-10-CM | POA: Diagnosis present

## 2018-08-12 DIAGNOSIS — Z85528 Personal history of other malignant neoplasm of kidney: Secondary | ICD-10-CM

## 2018-08-12 DIAGNOSIS — E860 Dehydration: Secondary | ICD-10-CM | POA: Diagnosis present

## 2018-08-12 DIAGNOSIS — N179 Acute kidney failure, unspecified: Secondary | ICD-10-CM | POA: Diagnosis present

## 2018-08-12 DIAGNOSIS — Z823 Family history of stroke: Secondary | ICD-10-CM

## 2018-08-12 DIAGNOSIS — F0390 Unspecified dementia without behavioral disturbance: Secondary | ICD-10-CM | POA: Diagnosis present

## 2018-08-12 DIAGNOSIS — I129 Hypertensive chronic kidney disease with stage 1 through stage 4 chronic kidney disease, or unspecified chronic kidney disease: Secondary | ICD-10-CM | POA: Diagnosis present

## 2018-08-12 DIAGNOSIS — N189 Chronic kidney disease, unspecified: Secondary | ICD-10-CM | POA: Diagnosis present

## 2018-08-12 DIAGNOSIS — Z8542 Personal history of malignant neoplasm of other parts of uterus: Secondary | ICD-10-CM | POA: Diagnosis not present

## 2018-08-12 DIAGNOSIS — Z811 Family history of alcohol abuse and dependence: Secondary | ICD-10-CM | POA: Diagnosis not present

## 2018-08-12 DIAGNOSIS — Z79899 Other long term (current) drug therapy: Secondary | ICD-10-CM

## 2018-08-12 DIAGNOSIS — Z87891 Personal history of nicotine dependence: Secondary | ICD-10-CM

## 2018-08-12 DIAGNOSIS — N184 Chronic kidney disease, stage 4 (severe): Secondary | ICD-10-CM | POA: Diagnosis present

## 2018-08-12 LAB — URINALYSIS, COMPLETE (UACMP) WITH MICROSCOPIC
Bilirubin Urine: NEGATIVE
Glucose, UA: NEGATIVE mg/dL
Ketones, ur: NEGATIVE mg/dL
Nitrite: NEGATIVE
Protein, ur: NEGATIVE mg/dL
RBC / HPF: >50 RBC/hpf — ABNORMAL HIGH (ref 0–5)
Specific Gravity, Urine: 1.009 (ref 1.005–1.030)
WBC, UA: >50 WBC/hpf — ABNORMAL HIGH (ref 0–5)
pH: 5 (ref 5.0–8.0)

## 2018-08-12 LAB — COMPREHENSIVE METABOLIC PANEL
ALT: 16 U/L (ref 0–44)
AST: 21 U/L (ref 15–41)
Albumin: 3.3 g/dL — ABNORMAL LOW (ref 3.5–5.0)
Alkaline Phosphatase: 56 U/L (ref 38–126)
Anion gap: 9 (ref 5–15)
BUN: 33 mg/dL — ABNORMAL HIGH (ref 8–23)
CO2: 22 mmol/L (ref 22–32)
Calcium: 8.4 mg/dL — ABNORMAL LOW (ref 8.9–10.3)
Chloride: 110 mmol/L (ref 98–111)
Creatinine, Ser: 2.36 mg/dL — ABNORMAL HIGH (ref 0.44–1.00)
GFR calc Af Amer: 24 mL/min — ABNORMAL LOW (ref >60)
GFR calc non Af Amer: 21 mL/min — ABNORMAL LOW (ref >60)
Glucose, Bld: 93 mg/dL (ref 70–99)
Potassium: 4.6 mmol/L (ref 3.5–5.1)
Sodium: 141 mmol/L (ref 135–145)
Total Bilirubin: 0.4 mg/dL (ref 0.3–1.2)
Total Protein: 6.7 g/dL (ref 6.5–8.1)

## 2018-08-12 LAB — GASTROINTESTINAL PANEL BY PCR, STOOL (REPLACES STOOL CULTURE)

## 2018-08-12 LAB — CBC
HCT: 28.6 % — ABNORMAL LOW (ref 36.0–46.0)
Hemoglobin: 8.5 g/dL — ABNORMAL LOW (ref 12.0–15.0)
MCH: 23.9 pg — ABNORMAL LOW (ref 26.0–34.0)
MCHC: 29.7 g/dL — ABNORMAL LOW (ref 30.0–36.0)
MCV: 80.3 fL (ref 80.0–100.0)
Platelets: 245 10*3/uL (ref 150–400)
RBC: 3.56 MIL/uL — ABNORMAL LOW (ref 3.87–5.11)
RDW: 15.6 % — ABNORMAL HIGH (ref 11.5–15.5)
WBC: 3.7 10*3/uL — ABNORMAL LOW (ref 4.0–10.5)
nRBC: 0 % (ref 0.0–0.2)

## 2018-08-12 LAB — C DIFFICILE QUICK SCREEN W PCR REFLEX
C Diff antigen: NEGATIVE
C Diff interpretation: NOT DETECTED
C Diff toxin: NEGATIVE

## 2018-08-12 LAB — SARS CORONAVIRUS 2 BY RT PCR (HOSPITAL ORDER, PERFORMED IN ~~LOC~~ HOSPITAL LAB): SARS Coronavirus 2: NEGATIVE

## 2018-08-12 LAB — LIPASE, BLOOD: Lipase: 27 U/L (ref 11–51)

## 2018-08-12 LAB — MAGNESIUM: Magnesium: 2.3 mg/dL (ref 1.7–2.4)

## 2018-08-12 MED ORDER — CITALOPRAM HYDROBROMIDE 20 MG PO TABS
20.0000 mg | ORAL_TABLET | Freq: Every day | ORAL | Status: DC
  Administered 2018-08-14 – 2018-08-15 (×2): 20 mg via ORAL
  Filled 2018-08-12 (×2): qty 1

## 2018-08-12 MED ORDER — POTASSIUM CHLORIDE CRYS ER 20 MEQ PO TBCR
20.0000 meq | EXTENDED_RELEASE_TABLET | Freq: Two times a day (BID) | ORAL | Status: DC
  Administered 2018-08-12 – 2018-08-15 (×5): 20 meq via ORAL
  Filled 2018-08-12 (×2): qty 1
  Filled 2018-08-12: qty 2
  Filled 2018-08-12 (×3): qty 1

## 2018-08-12 MED ORDER — DONEPEZIL HCL 5 MG PO TABS
5.0000 mg | ORAL_TABLET | Freq: Every day | ORAL | Status: DC
  Administered 2018-08-12 – 2018-08-14 (×3): 5 mg via ORAL
  Filled 2018-08-12 (×5): qty 1

## 2018-08-12 MED ORDER — MYCOPHENOLATE SODIUM 180 MG PO TBEC
180.0000 mg | DELAYED_RELEASE_TABLET | Freq: Two times a day (BID) | ORAL | Status: DC
  Administered 2018-08-12 – 2018-08-15 (×5): 180 mg via ORAL
  Filled 2018-08-12 (×8): qty 1

## 2018-08-12 MED ORDER — ACETAMINOPHEN 650 MG RE SUPP: 650.0000 mg | Freq: Four times a day (QID) | RECTAL | Status: DC | PRN

## 2018-08-12 MED ORDER — HYDROCODONE-ACETAMINOPHEN 5-325 MG PO TABS: 1.0000 | ORAL_TABLET | ORAL | Status: DC | PRN

## 2018-08-12 MED ORDER — FOLIC ACID 1 MG PO TABS
1.0000 mg | ORAL_TABLET | Freq: Two times a day (BID) | ORAL | Status: DC
  Administered 2018-08-12 – 2018-08-15 (×5): 1 mg via ORAL
  Filled 2018-08-12 (×5): qty 1

## 2018-08-12 MED ORDER — LOPERAMIDE HCL 2 MG PO CAPS: 2.0000 mg | ORAL_CAPSULE | Freq: Four times a day (QID) | ORAL | Status: DC | PRN

## 2018-08-12 MED ORDER — METOPROLOL SUCCINATE ER 25 MG PO TB24
25.0000 mg | ORAL_TABLET | Freq: Every day | ORAL | Status: DC
  Administered 2018-08-14: 25 mg via ORAL
  Filled 2018-08-12 (×2): qty 1

## 2018-08-12 MED ORDER — ACETAMINOPHEN 325 MG PO TABS: 650.0000 mg | ORAL_TABLET | Freq: Four times a day (QID) | ORAL | Status: DC | PRN

## 2018-08-12 MED ORDER — ALBUTEROL SULFATE (2.5 MG/3ML) 0.083% IN NEBU: 2.5000 mg | INHALATION_SOLUTION | RESPIRATORY_TRACT | Status: DC | PRN

## 2018-08-12 MED ORDER — SODIUM CHLORIDE 0.9% FLUSH
3.0000 mL | Freq: Once | INTRAVENOUS | Status: AC
  Administered 2018-08-12: 3 mL via INTRAVENOUS

## 2018-08-12 MED ORDER — MELATONIN 5 MG PO TABS
20.0000 mg | ORAL_TABLET | Freq: Every day | ORAL | Status: DC
  Administered 2018-08-12 – 2018-08-14 (×3): 20 mg via ORAL
  Filled 2018-08-12 (×5): qty 4

## 2018-08-12 MED ORDER — ATORVASTATIN CALCIUM 20 MG PO TABS
40.0000 mg | ORAL_TABLET | Freq: Every day | ORAL | Status: DC
  Administered 2018-08-14 – 2018-08-15 (×2): 40 mg via ORAL
  Filled 2018-08-12 (×2): qty 2

## 2018-08-12 MED ORDER — SODIUM CHLORIDE 0.9 % IV SOLN: INTRAVENOUS | Status: DC | PRN

## 2018-08-12 MED ORDER — SODIUM CHLORIDE 0.9 % IV BOLUS
500.0000 mL | Freq: Once | INTRAVENOUS | Status: AC
  Administered 2018-08-12: 500 mL via INTRAVENOUS

## 2018-08-12 MED ORDER — HEPARIN SODIUM (PORCINE) 5000 UNIT/ML IJ SOLN
5000.0000 [IU] | Freq: Three times a day (TID) | INTRAMUSCULAR | Status: DC
  Administered 2018-08-12 – 2018-08-15 (×7): 5000 [IU] via SUBCUTANEOUS
  Filled 2018-08-12 (×7): qty 1

## 2018-08-12 MED ORDER — SIROLIMUS 1 MG PO TABS
3.0000 mg | ORAL_TABLET | Freq: Every day | ORAL | Status: DC
  Administered 2018-08-14 – 2018-08-15 (×2): 3 mg via ORAL
  Filled 2018-08-12 (×4): qty 3

## 2018-08-12 MED ORDER — CALCITRIOL 0.25 MCG PO CAPS
0.2500 ug | ORAL_CAPSULE | Freq: Every day | ORAL | Status: DC
  Administered 2018-08-14 – 2018-08-15 (×2): 0.25 ug via ORAL
  Filled 2018-08-12 (×3): qty 1

## 2018-08-12 MED ORDER — ONDANSETRON HCL 4 MG PO TABS: 4.0000 mg | ORAL_TABLET | Freq: Four times a day (QID) | ORAL | Status: DC | PRN

## 2018-08-12 MED ORDER — POTASSIUM CHLORIDE IN NACL 20-0.9 MEQ/L-% IV SOLN
INTRAVENOUS | Status: DC
  Administered 2018-08-12 – 2018-08-14 (×3): via INTRAVENOUS
  Filled 2018-08-12 (×5): qty 1000

## 2018-08-12 MED ORDER — ONDANSETRON HCL 4 MG/2ML IJ SOLN: 4.0000 mg | Freq: Four times a day (QID) | INTRAMUSCULAR | Status: DC | PRN

## 2018-08-12 MED ORDER — SODIUM CHLORIDE 0.9 % IV SOLN
1.0000 g | INTRAVENOUS | Status: DC
  Administered 2018-08-12 – 2018-08-14 (×3): 1 g via INTRAVENOUS
  Filled 2018-08-12 (×2): qty 10
  Filled 2018-08-12 (×2): qty 1

## 2018-08-12 NOTE — H&P (Signed)
[place] [facility] [place] NAME: [name]    MR#:  [id number]  DATE OF BIRTH:  1950/07/29  DATE OF ADMISSION:  08/12/2018  PRIMARY CARE PHYSICIAN: [facility] [place]   REQUESTING/REFERRING PHYSICIAN: Lannie Fields, [place]  CHIEF COMPLAINT:   Chief Complaint  Patient presents with  . Diarrhea   Persistent diarrhea for 9 days. HISTORY OF PRESENT ILLNESS:  [name]  is a 68 y.o. female with a known history of anemia, kidney cancer, GERD, hypertension, uterine cancer and dementia.  Patient is sent to ED due to above chief complaints.  The patient has dementia and cannot provide any information.  The patient has been on antibiotics for UTI and developed persistent watery diarrhea for the past 9 days.  She is found acute renal failure and dehydration.  ED PA request admission. PAST MEDICAL HISTORY:   Past Medical History:  Diagnosis Date  . Anemia   . Cancer of kidney ([place])   . Dementia ([place])   . GERD (gastroesophageal reflux disease)   . Hypertension   . Renal disorder   . Uterine cancer ([place])    PAST SURGICAL HISTORY:   Past Surgical History:  Procedure Laterality Date  . ABDOMINAL HYSTERECTOMY    . AV FISTULA PLACEMENT    . BREAST BIOPSY     Patient is unsure but she thinks she remember a Bx  . BREAST BIOPSY Right 2017   benign  . KIDNEY TRANSPLANT    . NEPHRECTOMY TRANSPLANTED ORGAN      SOCIAL HISTORY:   Social History   Tobacco Use  . Smoking status: Former Research scientist (life sciences)  . Smokeless tobacco: Never Used  Substance Use Topics  . Alcohol use: No    FAMILY HISTORY:   Family History  Problem Relation Age of Onset  . Stroke Mother   . Alcohol abuse Father   . Breast cancer Neg Hx     DRUG ALLERGIES:   Allergies  Allergen Reactions  . Ace Inhibitors Cough  . Lisinopril Cough    REVIEW OF SYSTEMS:   Review of Systems  Unable to perform ROS: Dementia    MEDICATIONS AT HOME:   Prior to  Admission medications   Medication Sig Start Date End Date Taking? Authorizing Provider  atorvastatin (LIPITOR) 40 MG tablet Take 40 mg by mouth at bedtime.    [provider]  calcitRIOL (ROCALTROL) 0.25 MCG capsule Take 0.25 mcg by mouth daily.    [provider]  calcium elemental as carbonate (TUMS ULTRA 1000) 400 MG chewable tablet Chew 1,000 mg by mouth daily as needed for heartburn.    [provider]  ciprofloxacin (CIPRO) 500 MG tablet Take 1 tablet (500 mg total) by mouth 2 (two) times daily. 08/04/18   Delman Kitten, MD  citalopram (CELEXA) 20 MG tablet Take 20 mg by mouth daily.    [provider]  donepezil (ARICEPT) 5 MG tablet Take 5 mg by mouth 2 (two) times daily.  04/05/16   [provider]  folic acid (FOLVITE) 1 MG tablet Take 1 mg by mouth 2 (two) times daily.     [provider]  furosemide (LASIX) 20 MG tablet Take 20 mg by mouth 2 (two) times daily.    [provider]  Melatonin 10 MG TABS Take 20 mg by mouth at bedtime.    [provider]  meloxicam (MOBIC) 15 MG tablet Take 15 mg by mouth daily. 06/14/18  [provider]  metoprolol succinate (TOPROL-XL) 25 MG 24 hr tablet Take 1 tablet (25 mg total) by mouth daily. 07/02/16   Gladstone Lighter, MD  mupirocin ointment (BACTROBAN) 2 % Apply 1 application topically 2 (two) times daily. 05/30/18   [provider]  mycophenolate (MYFORTIC) 180 MG EC tablet Take 180 mg by mouth 2 (two) times daily.    [provider]  potassium chloride (K-DUR) 10 MEQ tablet Take 20 mEq by mouth 2 (two) times daily. 01/26/18   [provider]  sirolimus (RAPAMUNE) 1 MG tablet Take 3 mg by mouth daily.     [provider]      VITAL SIGNS:  Blood pressure 139/88, pulse 63, temperature 98.1 F (36.7 C), temperature source Oral, resp. rate 20, height 4\' 11"  (1.499 m), weight 112 kg, SpO2 100 %.  PHYSICAL EXAMINATION:  Physical  Exam  GENERAL:  68 y.o.-year-old patient lying in the bed with no acute distress.  EYES: Pupils equal, round, reactive to light and accommodation. No scleral icterus. Extraocular muscles intact.  HEENT: Head atraumatic, normocephalic.  NECK:  Supple, no jugular venous distention. No thyroid enlargement, no tenderness.  LUNGS: Normal breath sounds bilaterally, no wheezing, rales,rhonchi or crepitation. No use of accessory muscles of respiration.  CARDIOVASCULAR: S1, S2 normal. No murmurs, rubs, or gallops.  ABDOMEN: Soft, nontender, nondistended. Bowel sounds present. No organomegaly or mass.  EXTREMITIES: No pedal edema, cyanosis, or clubbing.  NEUROLOGIC: Not follow commands, unable to exam. PSYCHIATRIC: The patient is demented. SKIN: No obvious rash, lesion, or ulcer.   LABORATORY PANEL:   CBC Recent Labs  Lab 08/12/18 1134  WBC 3.7*  HGB 8.5*  HCT 28.6*  PLT 245   ------------------------------------------------------------------------------------------------------------------  Chemistries  Recent Labs  Lab 08/12/18 1134  NA 141  K 4.6  CL 110  CO2 22  GLUCOSE 93  BUN 33*  CREATININE 2.36*  CALCIUM 8.4*  AST 21  ALT 16  ALKPHOS 56  BILITOT 0.4   ------------------------------------------------------------------------------------------------------------------  Cardiac Enzymes No results for input(s): TROPONINI in the last 168 hours. ------------------------------------------------------------------------------------------------------------------  RADIOLOGY:  Ct Renal Stone Study  Result Date: 08/12/2018 CLINICAL DATA:  Diarrhea, increased confusion EXAM: CT ABDOMEN AND PELVIS WITHOUT CONTRAST TECHNIQUE: Multidetector CT imaging of the abdomen and pelvis was performed following the standard protocol without IV contrast. COMPARISON:  10/07/2017 FINDINGS: Lower chest: Moderate pericardial effusion, unchanged from prior. Hepatobiliary: No focal liver abnormality is  seen. Status post cholecystectomy. Postoperative biliary ductal dilatation. Pancreas: Unremarkable. No pancreatic ductal dilatation or surrounding inflammatory changes. Spleen: Normal in size without significant abnormality. Adrenals/Urinary Tract: Adrenal glands are unremarkable. Status post left nephrectomy. Atrophic native right kidney. There is a right lower quadrant renal transplant graft which is unremarkable in appearance. No hydronephrosis. Bladder is unremarkable. Stomach/Bowel: Stomach is within normal limits. Appendix is not clearly visualized. No evidence of bowel wall thickening, distention, or inflammatory changes. Vascular/Lymphatic: Calcific atherosclerosis. No enlarged abdominal or pelvic lymph nodes. Reproductive: Status post hysterectomy. Other: Evidence of prior ventral hernia mesh repair. No abdominopelvic ascites. Musculoskeletal: No acute or significant osseous findings. IMPRESSION: 1.  No acute noncontrast CT findings of the abdomen or pelvis. 2. Chronic, incidental, and postoperative findings including left nephrectomy and right lower quadrant renal transplant graft. Electronically Signed   By: Eddie Candle M.D.   On: 08/12/2018 14:23      IMPRESSION AND PLAN:   Acute renal failure on CKD stage III due to persistent diarrhea. Patient will be admitted to medical  floor. IV fluid support and follow-up BMP.  C. difficile test and a GI panel are negative.  Imodium as needed.  UTI.  Rocephin IV and follow-up urine culture. Anemia of chronic disease.  Stable. Dementia.  Aspiration fall precaution.  All the records are reviewed and case discussed with ED provider. Management plans discussed with the patient's daughter and they are in agreement.  CODE STATUS:    TOTAL TIME TAKING CARE OF THIS PATIENT: 45 minutes.    Demetrios Loll M.D on 08/12/2018 at 3:08 PM  Between 7am to 6pm - Pager - [phone]  After 6pm go to [email or web address] - Technical brewer  [facility]  Office  [phone]  CC: Primary care physician; [facility]   Note: This dictation was prepared with Dragon dictation along with smaller phrase technology. Any transcriptional errors that result from this process are unin

## 2018-08-12 NOTE — ED Notes (Signed)
ED TO INPATIENT HANDOFF REPORT  ED Nurse Name and Phone #: [phone]  S Name/Age/Gender [name] 68 y.o. female Room/Bed: ED08A/ED08A  Code Status   Code Status: Prior  Home/SNF/Other Home Patient oriented to: self and place Is this baseline? 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 Ace Inhibitors Cough  . Lisinopril Cough    Level of Care/Admitting Diagnosis ED Disposition    ED Disposition Condition [name] Area: [place] [[id number]]  Level of Care: Med-Surg [16]  Covid Evaluation: Screening Protocol (No Symptoms)  Diagnosis: Renal failure (ARF), acute on chronic [facility]) [062376]  Admitting Physician: [name] [phone]  Attending Physician: [name] [[id number]]  Estimated length of stay: past midnight tomorrow  Certification:: I certify this patient will need inpatient services for at least 2 midnights  PT Class (Do Not Modify): Inpatient [101]  PT Acc Code (Do Not Modify): Private [1]       B Medical/Surgery History Past Medical History:  Diagnosis Date  . Anemia   . Cancer of kidney ([place])   . Dementia ([place])   . GERD (gastroesophageal reflux disease)   . Hypertension   . Renal disorder   . Uterine cancer [facility])    Past Surgical History:  Procedure Laterality Date  . ABDOMINAL HYSTERECTOMY    . AV FISTULA PLACEMENT    . BREAST BIOPSY     Patient is unsure but she thinks she remember a Bx  . BREAST BIOPSY Right 2017   benign  . KIDNEY TRANSPLANT    . NEPHRECTOMY TRANSPLANTED ORGAN       A IV Location/Drains/Wounds Patient Lines/Drains/Airways Status   Active  Line/Drains/Airways    Name:   Placement date:   Placement time:   Site:   Days:   Peripheral IV 08/12/18 Left Antecubital   08/12/18    1136    Antecubital   less than 1          Intake/Output Last 24 hours  Intake/Output Summary (Last 24 hours) at 08/12/2018 1534 Last data filed at 08/12/2018 1251 Gross per 24 hour  Intake 500 ml  Output -  Net 500 ml    Labs/Imaging Results for orders placed or performed during the Navarro encounter of 08/12/18 (from the past 48 hour(s))  Lipase, blood     Status: None   Collection Time: 08/12/18 11:34 AM  Result Value Ref Range   Lipase 27 11 - 51 U/L    Comment: Performed at [facility], [address] [place], [place] [address]  Comprehensive metabolic panel     Status: Abnormal   Collection Time: 08/12/18 11:34 AM  Result Value Ref Range   Sodium 141 135 - 145 mmol/L   Potassium 4.6 3.5 - 5.1 mmol/L   Chloride 110 98 - 111 mmol/L   CO2 22 22 - 32 mmol/L   Glucose, Bld 93 70 - 99 mg/dL   BUN 33 (H) 8 - 23 mg/dL   Creatinine, Ser 2.36 (H) 0.44 - 1.00 mg/dL   Calcium 8.4 (L) 8.9 - 10.3 mg/dL   Total  Protein 6.7 6.5 - 8.1 g/dL   Albumin 3.3 (L) 3.5 - 5.0 g/dL   AST 21 15 - 41 U/L   ALT 16 0 - 44 U/L   Alkaline Phosphatase 56 38 - 126 U/L   Total Bilirubin 0.4 0.3 - 1.2 mg/dL   GFR calc non Af Amer 21 (L) >60 mL/min   GFR calc Af Amer 24 (L) >60 mL/min   Anion gap 9 5 - 15    Comment: Performed at [facility], [address] [place], [place] [address]  CBC     Status: Abnormal   Collection Time: 08/12/18 11:34 AM  Result Value Ref Range   WBC 3.7 (L) 4.0 - 10.5 K/uL   RBC 3.56 (L) 3.87 - 5.11 MIL/uL   Hemoglobin 8.5 (L) 12.0 - 15.0 g/dL   HCT 28.6 (L) 36.0 - 46.0 %   MCV 80.3 80.0 - 100.0 fL   MCH 23.9 (L) 26.0 - 34.0 pg   MCHC 29.7 (L) 30.0 - 36.0 g/dL   RDW 15.6 (H) 11.5 - 15.5 %   Platelets 245 150 - 400 K/uL   nRBC 0.0 0.0 - 0.2 %    Comment: Performed at [facility], [address] [place], [place] [address]  Urinalysis, Complete w Microscopic     Status: Abnormal   Collection Time: 08/12/18 11:38 AM  Result Value Ref Range   Color, Urine YELLOW (A) YELLOW   APPearance TURBID (A) CLEAR   Specific Gravity, Urine 1.009 1.005 - 1.030   pH 5.0 5.0 - 8.0   Glucose, UA NEGATIVE NEGATIVE mg/dL   Hgb urine dipstick LARGE (A) NEGATIVE   Bilirubin Urine NEGATIVE NEGATIVE   Ketones, ur NEGATIVE NEGATIVE mg/dL   Protein, ur NEGATIVE NEGATIVE mg/dL   Nitrite NEGATIVE NEGATIVE   Leukocytes,Ua LARGE (A) NEGATIVE   RBC / HPF >50 (H) 0 - 5 RBC/hpf   WBC, UA >50 (H) 0 - 5 WBC/hpf   Bacteria, UA FEW (A) NONE SEEN   Squamous Epithelial / LPF 0-5 0 - 5   WBC Clumps PRESENT    Budding Yeast PRESENT     Comment: Performed at [facility], [address] [place], [place] [address]  Gastrointestinal Panel by PCR , Stool     Status: None   Collection Time: 08/12/18 11:38 AM   Specimen: Stool  Result Value Ref Range   Campylobacter species NOT DETECTED NOT DETECTED   Plesimonas shigelloides NOT DETECTED NOT DETECTED   Salmonella species NOT DETECTED NOT DETECTED   Yersinia enterocolitica NOT DETECTED NOT DETECTED   Vibrio species NOT DETECTED NOT DETECTED   Vibrio cholerae NOT DETECTED NOT DETECTED   Enteroaggregative E coli (EAEC) NOT DETECTED NOT DETECTED   Enteropathogenic E coli (EPEC) NOT DETECTED NOT DETECTED   Enterotoxigenic E coli (ETEC) NOT DETECTED NOT DETECTED   Shiga like toxin producing E coli (STEC) NOT DETECTED NOT DETECTED   Shigella/Enteroinvasive E coli (EIEC) NOT DETECTED NOT DETECTED   Cryptosporidium NOT DETECTED NOT DETECTED   Cyclospora cayetanensis NOT DETECTED NOT DETECTED   Entamoeba histolytica NOT DETECTED NOT DETECTED   Giardia lamblia NOT DETECTED NOT DETECTED   Adenovirus F40/41 NOT DETECTED NOT DETECTED   Astrovirus NOT DETECTED NOT DETECTED   Norovirus GI/GII NOT DETECTED NOT DETECTED   Rotavirus A NOT DETECTED NOT DETECTED    Sapovirus (I, II, IV, and V) NOT DETECTED NOT DETECTED    Comment: Performed at [facility], [address] [place], [place] [address]  C difficile quick scan w PCR reflex     Status: None   Collection Time: 08/12/18 11:38 AM   Specimen: Stool  Result Value Ref Range   C Diff antigen NEGATIVE NEGATIVE   C Diff toxin NEGATIVE NEGATIVE   C Diff interpretation No C. difficile detected.     Comment: Performed at [facility], [address] [place], [place] [address]   Ct Renal Joaquim Lai Study  Result Date: 08/12/2018 CLINICAL DATA:  Diarrhea, increased confusion EXAM: CT ABDOMEN AND PELVIS WITHOUT CONTRAST TECHNIQUE: Multidetector CT imaging of the abdomen and pelvis was performed following the standard protocol without IV contrast. COMPARISON:  10/07/2017 FINDINGS: Lower chest: Moderate pericardial effusion, unchanged from prior. Hepatobiliary: No focal liver abnormality is seen. Status post cholecystectomy. Postoperative biliary ductal dilatation. Pancreas: Unremarkable. No pancreatic ductal dilatation or surrounding inflammatory changes. Spleen: Normal in size without significant abnormality. Adrenals/Urinary Tract: Adrenal glands are unremarkable. Status post left nephrectomy. Atrophic native right kidney. There is a right lower quadrant renal transplant graft which is unremarkable in appearance. No hydronephrosis. Bladder is unremarkable. Stomach/Bowel: Stomach is within normal limits. Appendix is not clearly visualized. No evidence of bowel wall thickening, distention, or inflammatory changes. Vascular/Lymphatic: Calcific atherosclerosis. No enlarged abdominal or pelvic lymph nodes. Reproductive: Status post hysterectomy. Other: Evidence of prior ventral hernia mesh repair. No abdominopelvic ascites. Musculoskeletal: No acute or significant osseous findings. IMPRESSION: 1.  No acute noncontrast CT findings of the abdomen or pelvis. 2. Chronic, incidental, and postoperative findings  including left nephrectomy and right lower quadrant renal transplant graft. Electronically Signed   By: Eddie Candle M.D.   On: 08/12/2018 14:23    Pending Labs Unresulted Labs (From admission, onward)    Start     Ordered   08/12/18 1447  SARS Coronavirus 2 (CEPHEID - Performed in [facility] lab), [facility] Order  Once,   STAT    Question:  Rule Out  Answer:  Yes   08/12/18 1446   Signed and Held  Creatinine, serum  (heparin)  Once,   R    Comments: Baseline for heparin therapy IF NOT ALREADY DRAWN.    Signed and Held   Signed and Held  Basic metabolic panel  Tomorrow morning,   R     Signed and Held   Signed and Held  CBC  Tomorrow morning,   R     Signed and Held   Signed and Held  Magnesium  Add-on,   R     Signed and Held   Signed and Held  Urine Culture  Add-on,   STAT     Signed and Held          Vitals/Pain Today's Vitals   08/12/18 1300 08/12/18 1330 08/12/18 1443 08/12/18 1530  BP: 118/67 108/64 139/88 122/60  Pulse:   63 61  Resp: (!) 9 10 20 14   Temp:      TempSrc:      SpO2:   100% 100%  Weight:      Height:      PainSc:   0-No pain     Isolation Precautions Enteric precautions (UV disinfection)  Medications Medications  sodium chloride flush (NS) 0.9 % injection 3 mL (3 mLs Intravenous Given 08/12/18 1239)  sodium chloride 0.9 % bolus 500 mL (0 mLs Intravenous Stopped 08/12/18 1251)    Mobility non-ambulatory Low fall risk   Focused Assessments Cardiac Assessment Handoff:    Lab Results  Component Value Date   CKTOTAL 174 08/01/2018   TROPONINI 0.03 ([place]) 08/01/2018   No results found for: DDIMER Does the Patient currently have chest pain? No     R Recommendations: See Admitting Provider Note  Report given to:   Additional Notes:

## 2018-08-12 NOTE — Plan of Care (Signed)

## 2018-08-12 NOTE — ED Provider Notes (Signed)
[facility] Emergency Department Provider Note  ____________________________________________  Time seen: Approximately 12:38 PM  I have reviewed the triage vital signs and the nursing notes.   HISTORY  Chief Complaint Diarrhea    HPI [name] is a 68 y.o. female with a history of dementia, GERD and hypertension presents to the emergency department with diarrhea for the past 9 days.  Patient has a history of dementia and cannot fully contribute to history.  She denies any type of abdominal pain and appears to be resting comfortably in emergency department.  She was afebrile in triage.        Past Medical History:  Diagnosis Date  . Anemia   . Cancer of kidney ([place])   . Dementia ([place])   . GERD (gastroesophageal reflux disease)   . Hypertension   . Renal disorder   . Uterine cancer [facility])     Patient Active Problem List   Diagnosis Date Noted  . Confusion 03/23/2018  . Hypokalemia 06/01/2017  . Sepsis ([place]) 06/29/2016  . UTI (urinary tract infection) 06/29/2016  . GERD (gastroesophageal reflux disease) 06/29/2016  . Dementia ([place]) 06/29/2016  . 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 Essential hypertension 07/06/2014  . H/O kidney transplant 07/06/2014    Past Surgical History:  Procedure Laterality Date  . ABDOMINAL HYSTERECTOMY    . AV FISTULA PLACEMENT    . BREAST BIOPSY     Patient is unsure but she thinks she remember a Bx  . BREAST BIOPSY Right 2017   benign  . KIDNEY TRANSPLANT    . NEPHRECTOMY TRANSPLANTED ORGAN      Prior to Admission medications   Medication Sig Start Date End Date Taking? Authorizing Provider  atorvastatin (LIPITOR) 40 MG tablet Take 40 mg by mouth at bedtime.    [provider]  calcitRIOL (ROCALTROL) 0.25 MCG capsule Take 0.25 mcg by mouth daily.    [provider]  calcium elemental as carbonate (TUMS ULTRA 1000) 400 MG chewable tablet Chew 1,000 mg by mouth daily as needed for  heartburn.    [provider]  ciprofloxacin (CIPRO) 500 MG tablet Take 1 tablet (500 mg total) by mouth 2 (two) times daily. 08/04/18   Delman Kitten, MD  citalopram (CELEXA) 20 MG tablet Take 20 mg by mouth daily.    [provider]  donepezil (ARICEPT) 5 MG tablet Take 5 mg by mouth 2 (two) times daily.  04/05/16   [provider]  folic acid (FOLVITE) 1 MG tablet Take 1 mg by mouth 2 (two) times daily.     [provider]  furosemide (LASIX) 20 MG tablet Take 20 mg by mouth 2 (two) times daily.    [provider]  Melatonin 10 MG TABS Take 20 mg by mouth at bedtime.    [provider]  meloxicam (MOBIC) 15 MG tablet Take 15 mg by mouth daily. 06/14/18   [provider]  metoprolol succinate (TOPROL-XL) 25 MG 24 hr tablet Take 1 tablet (25 mg total) by mouth daily. 07/02/16   Gladstone Lighter, MD  mupirocin ointment (BACTROBAN) 2 % Apply 1 application topically 2 (two) times daily. 05/30/18   [provider]  mycophenolate (MYFORTIC) 180 MG EC tablet Take 180 mg by mouth 2 (two) times daily.    [provider]  potassium chloride (K-DUR) 10 MEQ tablet Take 20 mEq by mouth 2 (two) times daily. 01/26/18   [provider]  sirolimus (RAPAMUNE) 1 MG tablet Take  3 mg by mouth daily.     [provider]    Allergies Ace inhibitors and Lisinopril  Family History  Problem Relation Age of Onset  . Stroke Mother   . Alcohol abuse Father   . Breast cancer Neg Hx     Social History Social History   Tobacco Use  . Smoking status: Former Research scientist (life sciences)  . Smokeless tobacco: Never Used  Substance Use Topics  . Alcohol use: No  . Drug use: No     Review of Systems  Constitutional: No fever/chills Eyes: No visual changes. No discharge ENT: No upper respiratory complaints. Cardiovascular: no chest pain. Respiratory: no cough. No SOB. Gastrointestinal: No abdominal pain.  No nausea, no vomiting. Patient  has diarrhea. No constipation. Genitourinary: Negative for dysuria. No hematuria Musculoskeletal: Negative for musculoskeletal pain. Skin: Negative for rash, abrasions, lacerations, ecchymosis. Neurological: Negative for headaches, focal weakness or numbness.   ____________________________________________   PHYSICAL EXAM:  VITAL SIGNS: ED Triage Vitals  Enc Vitals Group     BP 08/12/18 1130 124/64     Pulse Rate 08/12/18 1130 68     Resp 08/12/18 1130 17     Temp 08/12/18 1130 98.1 F (36.7 C)     Temp Source 08/12/18 1130 Oral     SpO2 08/12/18 1130 100 %     Weight 08/12/18 1132 246 lb 14.6 oz (112 kg)     Height 08/12/18 1132 4\' 11"  (1.499 m)     Head Circumference --      Peak Flow --      Pain Score 08/12/18 1132 0     Pain Loc --      Pain Edu? --      Excl. in [place]? --      Constitutional: Alert and oriented. Well appearing and in no acute distress. Eyes: Conjunctivae are normal. PERRL. EOMI. Head: Atraumatic. Cardiovascular: Normal rate, regular rhythm. Normal S1 and S2.  Good peripheral circulation. Respiratory: Normal respiratory effort without tachypnea or retractions. Lungs CTAB. Good air entry to the bases with no decreased or absent breath sounds. Gastrointestinal: Bowel sounds 4 quadrants. Soft and nontender to palpation. No guarding or rigidity. No palpable masses. No distention. No CVA tenderness. Musculoskeletal: Full range of motion to all extremities. No gross deformities appreciated. Neurologic:  Normal speech and language. No gross focal neurologic deficits are appreciated.  Skin:  Skin is warm, dry and intact. No rash noted. Psychiatric: Mood and affect are normal. Speech and behavior are normal. Patient exhibits appropriate insight and judgement.   ____________________________________________   LABS (all labs ordered are listed, but only abnormal results are displayed)  Labs Reviewed  COMPREHENSIVE METABOLIC PANEL - Abnormal; Notable for the  following components:      Result Value   BUN 33 (*)    Creatinine, Ser 2.36 (*)    Calcium 8.4 (*)    Albumin 3.3 (*)    GFR calc non Af Amer 21 (*)    GFR calc Af Amer 24 (*)    All other components within normal limits  CBC - Abnormal; Notable for the following components:   WBC 3.7 (*)    RBC 3.56 (*)    Hemoglobin 8.5 (*)    HCT 28.6 (*)    MCH 23.9 (*)    MCHC 29.7 (*)    RDW 15.6 (*)    All other components within normal limits  URINALYSIS, COMPLETE (UACMP) WITH MICROSCOPIC - Abnormal; Notable for the following components:  Color, Urine YELLOW (*)    APPearance TURBID (*)    Hgb urine dipstick LARGE (*)    Leukocytes,Ua LARGE (*)    RBC / HPF >50 (*)    WBC, UA >50 (*)    Bacteria, UA FEW (*)    All other components within normal limits  GASTROINTESTINAL PANEL BY PCR, STOOL (REPLACES STOOL CULTURE)  C DIFFICILE QUICK SCREEN W PCR REFLEX  SARS CORONAVIRUS 2 (HOSPITAL ORDER, PERFORMED IN [place] [facility])  LIPASE, BLOOD   ____________________________________________  EKG   ____________________________________________  RADIOLOGY   Ct Renal Stone Study  Result Date: 08/12/2018 CLINICAL DATA:  Diarrhea, increased confusion EXAM: CT ABDOMEN AND PELVIS WITHOUT CONTRAST TECHNIQUE: Multidetector CT imaging of the abdomen and pelvis was performed following the standard protocol without IV contrast. COMPARISON:  10/07/2017 FINDINGS: Lower chest: Moderate pericardial effusion, unchanged from prior. Hepatobiliary: No focal liver abnormality is seen. Status post cholecystectomy. Postoperative biliary ductal dilatation. Pancreas: Unremarkable. No pancreatic ductal dilatation or surrounding inflammatory changes. Spleen: Normal in size without significant abnormality. Adrenals/Urinary Tract: Adrenal glands are unremarkable. Status post left nephrectomy. Atrophic native right kidney. There is a right lower quadrant renal transplant graft which is unremarkable in  appearance. No hydronephrosis. Bladder is unremarkable. Stomach/Bowel: Stomach is within normal limits. Appendix is not clearly visualized. No evidence of bowel wall thickening, distention, or inflammatory changes. Vascular/Lymphatic: Calcific atherosclerosis. No enlarged abdominal or pelvic lymph nodes. Reproductive: Status post hysterectomy. Other: Evidence of prior ventral hernia mesh repair. No abdominopelvic ascites. Musculoskeletal: No acute or significant osseous findings. IMPRESSION: 1.  No acute noncontrast CT findings of the abdomen or pelvis. 2. Chronic, incidental, and postoperative findings including left nephrectomy and right lower quadrant renal transplant graft. Electronically Signed   By: Eddie Candle M.D.   On: 08/12/2018 14:23    ____________________________________________    PROCEDURES  Procedure(s) performed:    Procedures    Medications  sodium chloride flush (NS) 0.9 % injection 3 mL (3 mLs Intravenous Given 08/12/18 1239)  sodium chloride 0.9 % bolus 500 mL (0 mLs Intravenous Stopped 08/12/18 1251)     ____________________________________________   INITIAL IMPRESSION / ASSESSMENT AND PLAN / ED COURSE  Pertinent labs & imaging results that were available during my care of the patient were reviewed by me and considered in my medical decision making (see chart for details).  Review of the [place] CSRS was performed in accordance of the [place] prior to dispensing any controlled drugs.         Assessment and Plan:  Diarrhea Acute Kidney Injury  Cystitis  68 year old female with a history of dementia presents to the emergency department with 9 days of nonbloody diarrhea.  Patient was seen 8 days ago and had her antibiotics for UTI transition from [facility] to [place].  Spoke with patient's daughter and daughter states that patient is having non-bloody diarrhea multiple times throughout the day and night and is not resting well.    On physical exam, patient appears to be  resting comfortably.  She denies any type of abdominal pain but does not remember having diarrhea over the past of several days.  Differential diagnosis included infectious diarrhea, diarrhea secondary to antibiotic use, nephrolithiasis, cystitis...  Patient had mild leukopenia on CBC.  Hemoglobin is 8.5 and is currently trending close to baseline.  Renal function has declined with current creatinine at 2.36 and BUN 33.  Patient was C. difficile negative.  GI panel was reassuring.  Urinalysis was concerning with blood,  budding yeast and a large amount of leuks.  CT renal stone study revealed no evidence of nephrolithiasis.  Reached out to patient's daughter who is agreeable to admission for acute kidney injury, cystitis and diarrhea.   Spoke with prime doc on call Dr. Bridgett Larsson who accepted patient for admission.         ____________________________________________  FINAL CLINICAL IMPRESSION(S) / ED DIAGNOSES  Final diagnoses:  Diarrhea, unspecified type  Acute kidney injury ([place])  Cystitis      NEW MEDICATIONS STARTED DURING THIS VISIT:  ED Discharge Orders    None          This chart was dictated using voice recognition software/Dragon. Despite best efforts to proofread, errors can occur which can change the meaning. Any change was purely unintentional.    Lannie Fields, PA-C 08/12/18 1452    Delman Kitten, MD 08/12/18 [phone]

## 2018-08-12 NOTE — ED Notes (Signed)
Informed daughter Jilda Roche that patient is being admitted to room 227

## 2018-08-12 NOTE — Progress Notes (Signed)
Advanced Care Plan.  Purpose of Encounter: CODE STATUS Parties in Attendance: The patient, her daughter and me. Patient's Decisional Capacity: No. Medical Story: [name]  is a 68 y.o. female with a known history of anemia, kidney cancer, GERD, hypertension, uterine cancer and dementia.  The patient is being admitted for acute renal failure due to dehydration secondary to persistent diarrhea.  I discussed with patient's daughter about her current condition, prognosis and CODE STATUS.  Patient's daughter said that the patient wants to be resuscitated and intubated if she has cardiopulmonary arrest. Plan:  Code Status: Full code. Time spent discussing advance care planning: 18 minutes.

## 2018-08-12 NOTE — ED Triage Notes (Addendum)
Pt comes into the ED via [facility] From home with c/o diarrhea for over 8 days, states she has been on abx recently, pt has dementia and has a CNA at home care, family reports pt is more confused then normal..pt has loss BM with brief on arrival to the ED,

## 2018-08-13 LAB — CBC
HCT: 27.5 % — ABNORMAL LOW (ref 36.0–46.0)
Hemoglobin: 8.2 g/dL — ABNORMAL LOW (ref 12.0–15.0)
MCH: 24.3 pg — ABNORMAL LOW (ref 26.0–34.0)
MCHC: 29.8 g/dL — ABNORMAL LOW (ref 30.0–36.0)
MCV: 81.6 fL (ref 80.0–100.0)
Platelets: 212 10*3/uL (ref 150–400)
RBC: 3.37 MIL/uL — ABNORMAL LOW (ref 3.87–5.11)
RDW: 15.7 % — ABNORMAL HIGH (ref 11.5–15.5)
WBC: 4 10*3/uL (ref 4.0–10.5)
nRBC: 0 % (ref 0.0–0.2)

## 2018-08-13 LAB — BASIC METABOLIC PANEL
Anion gap: 9 (ref 5–15)
BUN: 29 mg/dL — ABNORMAL HIGH (ref 8–23)
CO2: 24 mmol/L (ref 22–32)
Calcium: 8.6 mg/dL — ABNORMAL LOW (ref 8.9–10.3)
Chloride: 110 mmol/L (ref 98–111)
Creatinine, Ser: 2.21 mg/dL — ABNORMAL HIGH (ref 0.44–1.00)
GFR calc Af Amer: 26 mL/min — ABNORMAL LOW (ref >60)
GFR calc non Af Amer: 22 mL/min — ABNORMAL LOW (ref >60)
Glucose, Bld: 97 mg/dL (ref 70–99)
Potassium: 4.3 mmol/L (ref 3.5–5.1)
Sodium: 143 mmol/L (ref 135–145)

## 2018-08-13 MED ORDER — RISAQUAD PO CAPS
1.0000 | ORAL_CAPSULE | Freq: Every day | ORAL | Status: DC
  Administered 2018-08-14 – 2018-08-15 (×2): 1 via ORAL
  Filled 2018-08-13 (×2): qty 1

## 2018-08-13 NOTE — Evaluation (Signed)
Physical Therapy Evaluation Patient Details Name: Riyah Bardon MRN: [id number] DOB: Jan 30, 1951 Today's Date: 08/13/2018   History of Present Illness  Patient is a 68 y/o female that presents with 8 days of diarrhea. She has dementia at baseline and has been more confused recently.  Clinical Impression  Patient is a 68 y/o female with baseline dementia, per old notes she lives with her daughter and has CNA assistance/home health throughout the day when daughter is at work. Patient is quite confused throughout this session and only intermittently follows commands. It is unclear if some of the assistance required in this session is due to cognitive decline or true physical need. She is able to ambulate a household distance with hand hold assistance and transfer with minimal physical assistance from the bed. She appears close to her recent baseline in mobility, though would need supervision 24/7 for safety at home as she has poor insight into her situation and demonstrates loss of balance laterally on multiple occasions in this session. She would likely benefit from HHPT after discharge.     Follow Up Recommendations Home health PT - Supervision 24/7    Equipment Recommendations       Recommendations for Other Services       Precautions / Restrictions Precautions Precautions: Fall Restrictions Weight Bearing Restrictions: No      Mobility  Bed Mobility Overal bed mobility: Needs Assistance Bed Mobility: Supine to Sit;Sit to Supine     Supine to sit: Mod assist Sit to supine: Mod assist   General bed mobility comments: Patient required physical assistance to manage torso, though unclear if due to cognitive status or physical need.  Transfers Overall transfer level: Needs assistance Equipment used: Rolling walker (2 wheeled) Transfers: Sit to/from Stand Sit to Stand: Min assist         General transfer comment: Patient requires minimal physical assistance, mod verbal cuing  to complete transfer. She is directed to hold onto RW, however is unable to consistently follow commands to do so.  Ambulation/Gait Ambulation/Gait assistance: Min guard;Min assist Gait Distance (Feet): 100 Feet Assistive device: 1 person hand held assist Gait Pattern/deviations: Step-through pattern;Drifts right/left   Gait velocity interpretation: <1.31 ft/sec, indicative of household ambulator General Gait Details: Patient noted to have tendency to grab for furniture. She is able to ambulate at household ambulator pace, however noted to have mild lateral sway/LOB.  Stairs            Wheelchair Mobility    Modified Rankin (Stroke Patients Only)       Balance Overall balance assessment: Mild deficits observed, not formally tested                                           Pertinent Vitals/Pain Pain Assessment: No/denies pain    Home Living Family/patient expects to be discharged to:: Private residence Living Arrangements: Children Available Help at Discharge: Available PRN/intermittently Type of Home: Apartment Home Access: Stairs to enter Entrance Stairs-Rails: Right;Left;Can reach both Entrance Stairs-Number of Steps: Patient is a poor historian, old PT notes indicate 2 steps in.   Home Equipment: [place] - 2 wheels;Cane - single point Additional Comments: Pt lives with her daughter; her daughter is gone during the day at work; pt reports using [place] intermittently for ambulation; no recent falls reported though patient is a poor historian.    Prior Function Level of Independence: Independent  with assistive device(s)         Comments: used SPC intermittently; pt reports that she does the cooking/cleaning although unsure of accuracy as patient is not oriented to date, person, place or situation (taken from previous note, though consistent in this session)     Hand Dominance   Dominant Hand: Right    Extremity/Trunk Assessment   Upper  Extremity Assessment Upper Extremity Assessment: Overall WFL for tasks assessed    Lower Extremity Assessment Lower Extremity Assessment: Overall WFL for tasks assessed       Communication   Communication: No difficulties  Cognition Arousal/Alertness: Awake/alert Behavior During Therapy: WFL for tasks assessed/performed Overall Cognitive Status: No family/caregiver present to determine baseline cognitive functioning                                 General Comments: Patient follows commands inconsistently and very intermittently. She is not oriented to place or situation, is able to recall her name and DOB.      General Comments General comments (skin integrity, edema, etc.): Soft tissue bulge noted in L proximal medial thigh.    Exercises Other Exercises Other Exercises: Assisted RN staff with rolling patient with mod-max A to complete hygeine as patient initially covered in stool. Required verbal and physical cuing to complete safely. Cuing for handhold position, use of UEs, bending knees to roll were provided both verbally and tactally.   Assessment/Plan    PT Assessment Patient needs continued PT services  PT Problem List Decreased strength;Decreased mobility;Decreased safety awareness;Decreased knowledge of use of DME;Decreased balance;Decreased cognition       PT Treatment Interventions DME instruction;Therapeutic exercise;Balance training;Gait training;Stair training;Neuromuscular re-education;Functional mobility training;Cognitive remediation;Therapeutic activities;Patient/family education    PT Goals (Current goals can be found in the Care Plan section)  Acute Rehab PT Goals Patient Stated Goal: To return home safely PT Goal Formulation: With patient Time For Goal Achievement: 08/27/18 Potential to Achieve Goals: Good    Frequency Min 2X/week   Barriers to discharge Decreased caregiver support Patient will need 24/7 supervision.    Co-evaluation                AM-PAC PT "6 Clicks" Mobility  Outcome Measure Help needed turning from your back to your side while in a flat bed without using bedrails?: A Lot Help needed moving from lying on your back to sitting on the side of a flat bed without using bedrails?: A Lot Help needed moving to and from a bed to a chair (including a wheelchair)?: A Little Help needed standing up from a chair using your arms (e.g., wheelchair or bedside chair)?: A Little Help needed to walk in hospital room?: A Little Help needed climbing 3-5 steps with a railing? : A Little 6 Click Score: 16    End of Session Equipment Utilized During Treatment: Gait belt Activity Tolerance: Patient tolerated treatment well Patient left: in chair;with call bell/phone within reach(CNA getting box for chair alarm, pad in place) Nurse Communication: Mobility status PT Visit Diagnosis: Unsteadiness on feet (R26.81)    Time: 2979-8921 PT Time Calculation (min) (ACUTE ONLY): 46 min   Charges:   PT Evaluation $PT Eval Moderate Complexity: 1 Mod PT Treatments $Therapeutic Activity: 23-37 mins(Rolling/hygeine activities in misc exercises.)     Royce Macadamia PT, DPT, CSCS      08/13/2018, 11:20 AM

## 2018-08-13 NOTE — Progress Notes (Addendum)
Updated daughter [name] over the phone and all her questions were answered. Patient dose have worse dementia  At baseline.  Patients Plan of care explained

## 2018-08-13 NOTE — Evaluation (Addendum)
Clinical/Bedside Swallow Evaluation Patient Details  Name: [name] MRN: [id number] Date of Birth: Feb 19, 1950  Today's Date: 08/13/2018 Time: SLP Start Time (ACUTE ONLY): 2563 SLP Stop Time (ACUTE ONLY): 1330 SLP Time Calculation (min) (ACUTE ONLY): 55 min  Past Medical History:  Past Medical History:  Diagnosis Date  . Anemia   . Cancer of kidney ([place])   . Dementia ([place])   . GERD (gastroesophageal reflux disease)   . Hypertension   . Renal disorder   . Uterine cancer [facility])    Past Surgical History:  Past Surgical History:  Procedure Laterality Date  . ABDOMINAL HYSTERECTOMY    . AV FISTULA PLACEMENT    . BREAST BIOPSY     Patient is unsure but she thinks she remember a Bx  . BREAST BIOPSY Right 2017   benign  . KIDNEY TRANSPLANT    . NEPHRECTOMY TRANSPLANTED ORGAN     HPI:   Pt is a 68 y.o. female with a known history of anemia, kidney cancer, GERD, hypertension, uterine cancer and Dementia.  Patient was sent to ED due to complaint of weakness and diarrhea.  The patient has Dementia and cannot provide much information.  The patient has been on antibiotics for UTI and developed persistent watery diarrhea for the past 9 days.  She is found acute renal failure and dehydration per ED assessment.    Assessment / Plan / Recommendation Clinical Impression  Pt appears to present w/ adequate oropharyngeal phase swallowing function w/ reduced risk for aspiration when following general aspiration precautions during oral intake. Pt does have a baseline of significant Dementia/Cognitive decline for ~3 years per chart notes. She often declines/refuses to eat/drink w/ NSG per report this admission - unsure of behaviors at home. She is verbally responsive and quite pleasant but easily distracted and requires moderate cues for follow through. She will not be talked into a trial of po(food or liquid) if she does not want it.  With encouragement and offering of drinks/food pt desired, pt  consumed po trials of thin liquids and purees w/ no overt s/s of aspiration; no decline in vocal quality or respiratory presentation during/post trials. Pt fed self w/ setup of drinks and ice cream consuming ~10-12+ ozs of thin liquids via straw/cup and 4 ozs of ice cream, 3 tsp trials of puree. Oral phase adequate w/ good bolus management and timely A-P transfer; oral clearing post all po trials was appropriate. OM exam revealed No unilateral weakness during speech and bolus management. Pt is missing upper dentition, many of the the lower dentition.  Recommend a more mech soft diet w/ cut/minced meats easy to gum/chew; Thin liquids. General aspiration precautions recommended w/ Pills in puree if needed for easier swallowing. Tray setup at meals and reduce distractions - supervision as needed. Offer pt's favorites of foods/liquids. Recommend f/u w/ Dietician for support. Encourage family to learn more re: Dementia (and eating/drinking) via Palliative Care services.  Suspect when pt is feeling better, she will be more incline to eat.  SLP Visit Diagnosis: Dysphagia, unspecified (R13.10)(impacted by Cognitive decline)    Aspiration Risk  (reduced following general precautions)    Diet Recommendation  Mech Soft diet w/ cut/minced meats; Thin liquids. General aspiration precautions; Support at meals w/ reduced distractions. Maximize pt's favorite foods, drinks. Nutritional supplement - drink form may be best.  Medication Administration: Whole meds with puree(or Crushed as indicated)    Other  Recommendations Recommended Consults: (Dietician f/u) Oral Care Recommendations: Oral care BID;Staff/trained caregiver  to provide oral care Other Recommendations: (n/a)   Follow up Recommendations None      Frequency and Duration (n/a)  (n/a)       Prognosis Prognosis for Safe Diet Advancement: Fair Barriers to Reach Goals: Cognitive deficits;Time post onset;Severity of deficits;Behavior      Swallow  Study   General Date of Onset: 08/12/18 Type of Study: Bedside Swallow Evaluation Previous Swallow Assessment: none Diet Prior to this Study: Regular;Thin liquids Temperature Spikes Noted: No(wbc 4.0) Respiratory Status: Room air History of Recent Intubation: No Behavior/Cognition: Alert;Cooperative;Pleasant mood;Confused;Distractible;Requires cueing(baseline Dementia) Oral Cavity Assessment: Within Functional Limits Oral Care Completed by SLP: Recent completion by staff Oral Cavity - Dentition: Missing dentition(few bottom dentition only) Vision: Functional for self-feeding Self-Feeding Abilities: Able to feed self;Needs assist;Needs set up Patient Positioning: Upright in chair Baseline Vocal Quality: Normal Volitional Cough: Cognitively unable to elicit Volitional Swallow: Unable to elicit    Oral/Motor/Sensory Function Overall Oral Motor/Sensory Function: Within functional limits(appeared so w/ bolus management; speech)   Ice Chips Ice chips: Not tested   Thin Liquid Thin Liquid: Within functional limits Presentation: Cup;Self Fed;Straw(~12+ ozs total) Other Comments: [place] Sprite; Ensure; juice    Nectar Thick Nectar Thick Liquid: Not tested   Honey Thick Honey Thick Liquid: Not tested   Puree Puree: Within functional limits Presentation: Self Fed;Spoon(3 trials) Other Comments: then a hard cup of ice cream   Solid     Solid: Not tested Other Comments: she declined the trials of mac and chez and meatloaf at meal       [employer], MS, CCC-SLP Rayshell Goecke 08/13/2018,5:40 PM

## 2018-08-13 NOTE — TOC Initial Note (Signed)
Transition of Care [facility]) - Initial/Assessment Note    Patient Details  Name: [name] MRN: [id number] Date of Birth: Jan 06, 1951  Transition of Care [facility]) CM/SW Contact:    Ross Ludwig, LCSW Phone Number: 08/13/2018, 6:32 PM  Clinical Narrative:                  Patient is a 68 year old female who has some dementia, she is alert and oriented x2.  CSW spoke with patient's daughter to complete assessment due to confusion.  Patient's daughter reports that patient has dementia, and has an aide who comes to the house 7 days a week.  Patient's daughter reports that she has used [place] in the past and they would like to use them again.  CSW explained role of CSW and process to coordinate home health.  PT is recommending home health PT, CSW spoke to [facility] and they can accept patient once she is medically ready for discharge.  Patient's daughter is requesting a different cane for patient, she has a quad cane, but struggles when using it.  Patient does have a bedside commode, and a walker at home per daughter.  Patient's daughter requested grab bars, CSW informed her that insurance does not pay for them, but she can find them at either [employer] or [employer].  Patient's daughter did not have any other questions or concerns.  CSW to continue to follow patient's progress throughout discharge planning.  Expected Discharge Plan: [place] Barriers to Discharge: Continued Medical Work up   Patient Goals and CMS Choice Patient states their goals for this hospitalization and ongoing recovery are:: Patient has dementia, daughter main contact plan to return back home with home health. CMS Medicare.gov Compare Post Acute Care list provided to:: Patient Represenative (must comment) Choice offered to / list presented to : Adult Children  Expected Discharge Plan and Services Expected Discharge Plan: [place] In-house Referral: Clinical  Social Work   Post Acute Care Choice: [place] arrangements for the past 2 months: [place]: PT, RN [place] Agency: [place] ([facility]) Date [place]: 08/13/18 Time [place]: [place] Representative spoke with at [place]: [place] Arrangements/Services Living arrangements for the past 2 months: [place] Lives with:: Adult Children Patient language and need for interpreter reviewed:: Yes Do you feel safe going back to the place where you live?: Yes      Need for Family Participation in Patient Care: Yes (Comment) Care giver support system in place?: Yes (comment) Current home services: Homehealth aide Criminal Activity/Legal Involvement Pertinent to Current Situation/Hospitalization: No - Comment as needed  Activities of Daily Living Home Assistive Devices/Equipment: None ADL Screening (condition at time of admission) Patient's cognitive ability adequate to safely complete daily activities?: No Is the patient deaf or have difficulty hearing?: No Does the patient have difficulty seeing, even when wearing glasses/contacts?: No Does the patient have difficulty concentrating, remembering, or making decisions?: Yes Patient able to express need for assistance with ADLs?: Yes Does the patient have difficulty dressing or bathing?: No Independently performs ADLs?: Yes (appropriate for developmental age) Does the patient have difficulty walking or climbing stairs?: Yes Weakness of Legs: Both Weakness of Arms/Hands: None  Permission Sought/Granted Permission sought  to share information with : Family Supports Permission granted to share information with : Yes, Verbal Permission Granted  Share Information with NAME: [name] Daughter [phone]  Permission granted to share info w AGENCY: [place]        Emotional Assessment Appearance:: Appears stated age    Affect (typically observed): Calm, Stable, Appropriate Orientation: : Oriented to Self, Oriented to Place Alcohol / Substance Use: Not Applicable Psych Involvement: No (comment)  Admission diagnosis:  Acute kidney injury ([place]) [N17.9] Cystitis [N30.90] Diarrhea, unspecified type [R19.7] Patient Active Problem List   Diagnosis Date Noted  . Renal failure (ARF), acute on chronic ([place]) 08/12/2018  . Confusion 03/23/2018  . Hypokalemia 06/01/2017  . Sepsis ([place]) 06/29/2016  . UTI (urinary tract infection) 06/29/2016  . GERD (gastroesophageal reflux disease) 06/29/2016  . Dementia ([place]) 06/29/2016  . 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 Essential hypertension 07/06/2014  . H/O kidney transplant 07/06/2014   PCP:  Center, [place]:   [facility] [id number], [place] [address] Phone: [phone] Fax: [phone]  [place], [place] [address] Phone: [phone] Fax: [phone]     Social Determinants of Health (SDOH) Interventions    Readmission Risk Interventions No flowsheet data found.

## 2018-08-13 NOTE — Progress Notes (Signed)
[place] at [place] NAME: [name]    MR#:  [id number]  DATE OF BIRTH:  1951-02-06  SUBJECTIVE:  CHIEF COMPLAINT:   Chief Complaint  Patient presents with  . Diarrhea   -Patient admitted with diarrhea, none this morning.  Slightly confused at baseline.  Also has UTI.  REVIEW OF SYSTEMS:  Review of Systems  Constitutional: Positive for malaise/fatigue. Negative for chills and fever.  HENT: Negative for congestion, ear discharge, hearing loss and nosebleeds.   Eyes: Negative for blurred vision and double vision.  Respiratory: Negative for cough, shortness of breath and wheezing.   Cardiovascular: Negative for chest pain, palpitations and leg swelling.  Gastrointestinal: Negative for abdominal pain, constipation, diarrhea, nausea and vomiting.  Genitourinary: Negative for dysuria.  Musculoskeletal: Negative for myalgias.  Neurological: Negative for dizziness, focal weakness, seizures, weakness and headaches.  Psychiatric/Behavioral: Negative for depression.    DRUG ALLERGIES:   Allergies  Allergen Reactions  . Ace Inhibitors Cough  . Lisinopril Cough    VITALS:  Blood pressure (!) 152/62, pulse 73, temperature 98.3 F (36.8 C), temperature source Oral, resp. rate 18, height 4\' 11"  (1.499 m), weight 112 kg, SpO2 100 %.  PHYSICAL EXAMINATION:  Physical Exam   GENERAL:  68 y.o.-year-old patient lying in the bed with no acute distress.  EYES: Pupils equal, round, reactive to light and accommodation. No scleral icterus. Extraocular muscles intact.  HEENT: Head atraumatic, normocephalic. Oropharynx and nasopharynx clear.  NECK:  Supple, no jugular venous distention. No thyroid enlargement, no tenderness.  LUNGS: Normal breath sounds bilaterally, no wheezing, rales,rhonchi or crepitation. No use of accessory muscles of respiration.  Decreased bibasilar breath sounds noted CARDIOVASCULAR: S1, S2 normal. No  rubs, or gallops.   2/6 systolic murmur is present ABDOMEN: Soft, nontender, nondistended. Bowel sounds present. No organomegaly or mass.  EXTREMITIES: No pedal edema, cyanosis, or clubbing.  NEUROLOGIC: Cranial nerves II through XII are intact. Muscle strength 5/5 in all extremities. Sensation intact. Gait not checked.  Global Weakness noted PSYCHIATRIC: The patient is alert and oriented x 2.  SKIN: No obvious rash, lesion, or ulcer.    LABORATORY PANEL:   CBC Recent Labs  Lab 08/13/18 0454  WBC 4.0  HGB 8.2*  HCT 27.5*  PLT 212   ------------------------------------------------------------------------------------------------------------------  Chemistries  Recent Labs  Lab 08/12/18 1134 08/12/18 1138 08/13/18 0454  NA 141  --  143  K 4.6  --  4.3  CL 110  --  110  CO2 22  --  24  GLUCOSE 93  --  97  BUN 33*  --  29*  CREATININE 2.36*  --  2.21*  CALCIUM 8.4*  --  8.6*  MG  --  2.3  --   AST 21  --   --   ALT 16  --   --   ALKPHOS 56  --   --   BILITOT 0.4  --   --    ------------------------------------------------------------------------------------------------------------------  Cardiac Enzymes No results for input(s): TROPONINI in the last 168 hours. ------------------------------------------------------------------------------------------------------------------  RADIOLOGY:  Ct Renal Stone Study  Result Date: 08/12/2018 CLINICAL DATA:  Diarrhea, increased confusion EXAM: CT ABDOMEN AND PELVIS WITHOUT CONTRAST TECHNIQUE: Multidetector CT imaging of the abdomen and pelvis was performed following the standard protocol without IV contrast. COMPARISON:  10/07/2017 FINDINGS: Lower chest: Moderate pericardial effusion, unchanged from prior. Hepatobiliary: No focal liver abnormality is seen. Status post cholecystectomy. Postoperative biliary ductal dilatation. Pancreas: Unremarkable.  No pancreatic ductal dilatation or surrounding inflammatory changes. Spleen: Normal in size without  significant abnormality. Adrenals/Urinary Tract: Adrenal glands are unremarkable. Status post left nephrectomy. Atrophic native right kidney. There is a right lower quadrant renal transplant graft which is unremarkable in appearance. No hydronephrosis. Bladder is unremarkable. Stomach/Bowel: Stomach is within normal limits. Appendix is not clearly visualized. No evidence of bowel wall thickening, distention, or inflammatory changes. Vascular/Lymphatic: Calcific atherosclerosis. No enlarged abdominal or pelvic lymph nodes. Reproductive: Status post hysterectomy. Other: Evidence of prior ventral hernia mesh repair. No abdominopelvic ascites. Musculoskeletal: No acute or significant osseous findings. IMPRESSION: 1.  No acute noncontrast CT findings of the abdomen or pelvis. 2. Chronic, incidental, and postoperative findings including left nephrectomy and right lower quadrant renal transplant graft. Electronically Signed   By: Eddie Candle M.D.   On: 08/12/2018 14:23    EKG:   Orders placed or performed during the hospital encounter of 08/12/18  . ED EKG  . ED EKG  . EKG 12-Lead  . EKG 12-Lead    ASSESSMENT AND PLAN:   68 year old female with past medical history significant for chronic anemia, hypertension, dementia, history of uterine cancer presents to hospital secondary to persistent diarrhea.  1.  Acute diarrhea-C. difficile is negative, GI panel is negative.  Continue to monitor.  Could be secretory diarrhea as it is very watery.  2.  Acute UTI-cultures have been sent for.  Currently on Rocephin  3.  Acute renal failure on CKD stage IV-Baseline creatinine seems to be around 1.8.  Continue IV fluids, avoid nephrotoxins and monitor  4.  Anemia of chronic disease-hemoglobin is stable around 8.  No indication for transfusion.  Monitor  5.  History of renal transplant-patient on sirolimus and mycophenolate.  No changes will be made  6.  DVT prophylaxis-subcutaneous heparin  Physical therapy  consulted   All the records are reviewed and case discussed with Care Management/Social Workerr. Management plans discussed with the patient, family and they are in agreement.  CODE STATUS: Full code  TOTAL TIME TAKING CARE OF THIS PATIENT: 38 minutes.   POSSIBLE D/C IN 2 DAYS, DEPENDING ON CLINICAL CONDITION.   Gladstone Lighter M.D on 08/13/2018 at 10:20 AM  Between 7am to 6pm - Pager - [phone]  After 6pm go to [email or web address] - password [email or web address] [place] [facility]  Office  [phone]  CC: Primary care physician; Center, [facility]

## 2018-08-14 LAB — URINE CULTURE: Culture: >=100000 — AB

## 2018-08-14 LAB — BASIC METABOLIC PANEL
Anion gap: 9 (ref 5–15)
BUN: 18 mg/dL (ref 8–23)
CO2: 21 mmol/L — ABNORMAL LOW (ref 22–32)
Calcium: 8.5 mg/dL — ABNORMAL LOW (ref 8.9–10.3)
Chloride: 110 mmol/L (ref 98–111)
Creatinine, Ser: 1.56 mg/dL — ABNORMAL HIGH (ref 0.44–1.00)
GFR calc Af Amer: 39 mL/min — ABNORMAL LOW (ref >60)
GFR calc non Af Amer: 34 mL/min — ABNORMAL LOW (ref >60)
Glucose, Bld: 101 mg/dL — ABNORMAL HIGH (ref 70–99)
Potassium: 4.7 mmol/L (ref 3.5–5.1)
Sodium: 140 mmol/L (ref 135–145)

## 2018-08-14 MED ORDER — FLUCONAZOLE 100 MG PO TABS
200.0000 mg | ORAL_TABLET | Freq: Every day | ORAL | Status: DC
  Administered 2018-08-14 – 2018-08-15 (×2): 200 mg via ORAL
  Filled 2018-08-14 (×2): qty 2

## 2018-08-14 NOTE — Progress Notes (Signed)
[place] at [place] NAME: [name]    MR#:  [id number]  DATE OF BIRTH:  Apr 15, 1950  SUBJECTIVE:  CHIEF COMPLAINT:   Chief Complaint  Patient presents with  . Diarrhea   - doing better today, no further diarrhea.  REVIEW OF SYSTEMS:  Review of Systems  Constitutional: Positive for malaise/fatigue. Negative for chills and fever.  HENT: Negative for congestion, ear discharge, hearing loss and nosebleeds.   Eyes: Negative for blurred vision and double vision.  Respiratory: Negative for cough, shortness of breath and wheezing.   Cardiovascular: Negative for chest pain, palpitations and leg swelling.  Gastrointestinal: Negative for abdominal pain, constipation, diarrhea, nausea and vomiting.  Genitourinary: Negative for dysuria.  Musculoskeletal: Negative for myalgias.  Neurological: Negative for dizziness, focal weakness, seizures, weakness and headaches.  Psychiatric/Behavioral: Negative for depression.    DRUG ALLERGIES:   Allergies  Allergen Reactions  . Ace Inhibitors Cough  . Lisinopril Cough    VITALS:  Blood pressure (!) 156/86, pulse 86, temperature (!) 97.5 F (36.4 C), temperature source Oral, resp. rate 20, height 4\' 11"  (1.499 m), weight 112 kg, SpO2 100 %.  PHYSICAL EXAMINATION:  Physical Exam   GENERAL:  68 y.o.-year-old patient lying in the bed with no acute distress.  EYES: Pupils equal, round, reactive to light and accommodation. No scleral icterus. Extraocular muscles intact.  HEENT: Head atraumatic, normocephalic. Oropharynx and nasopharynx clear.  NECK:  Supple, no jugular venous distention. No thyroid enlargement, no tenderness.  LUNGS: Normal breath sounds bilaterally, no wheezing, rales,rhonchi or crepitation. No use of accessory muscles of respiration.  Decreased bibasilar breath sounds noted CARDIOVASCULAR: S1, S2 normal. No  rubs, or gallops.  2/6 systolic murmur is present ABDOMEN: Soft,  nontender, nondistended. Bowel sounds present. No organomegaly or mass.  EXTREMITIES: No pedal edema, cyanosis, or clubbing.  NEUROLOGIC: Cranial nerves II through XII are intact. Muscle strength 5/5 in all extremities. Sensation intact. Gait not checked.  Global Weakness noted PSYCHIATRIC: The patient is alert and oriented x 2.  SKIN: No obvious rash, lesion, or ulcer.    LABORATORY PANEL:   CBC Recent Labs  Lab 08/13/18 0454  WBC 4.0  HGB 8.2*  HCT 27.5*  PLT 212   ------------------------------------------------------------------------------------------------------------------  Chemistries  Recent Labs  Lab 08/12/18 1134 08/12/18 1138 08/13/18 0454  NA 141  --  143  K 4.6  --  4.3  CL 110  --  110  CO2 22  --  24  GLUCOSE 93  --  97  BUN 33*  --  29*  CREATININE 2.36*  --  2.21*  CALCIUM 8.4*  --  8.6*  MG  --  2.3  --   AST 21  --   --   ALT 16  --   --   ALKPHOS 56  --   --   BILITOT 0.4  --   --    ------------------------------------------------------------------------------------------------------------------  Cardiac Enzymes No results for input(s): TROPONINI in the last 168 hours. ------------------------------------------------------------------------------------------------------------------  RADIOLOGY:  Ct Renal Stone Study  Result Date: 08/12/2018 CLINICAL DATA:  Diarrhea, increased confusion EXAM: CT ABDOMEN AND PELVIS WITHOUT CONTRAST TECHNIQUE: Multidetector CT imaging of the abdomen and pelvis was performed following the standard protocol without IV contrast. COMPARISON:  10/07/2017 FINDINGS: Lower chest: Moderate pericardial effusion, unchanged from prior. Hepatobiliary: No focal liver abnormality is seen. Status post cholecystectomy. Postoperative biliary ductal dilatation. Pancreas: Unremarkable. No pancreatic ductal dilatation or surrounding inflammatory changes.  Spleen: Normal in size without significant abnormality. Adrenals/Urinary Tract:  Adrenal glands are unremarkable. Status post left nephrectomy. Atrophic native right kidney. There is a right lower quadrant renal transplant graft which is unremarkable in appearance. No hydronephrosis. Bladder is unremarkable. Stomach/Bowel: Stomach is within normal limits. Appendix is not clearly visualized. No evidence of bowel wall thickening, distention, or inflammatory changes. Vascular/Lymphatic: Calcific atherosclerosis. No enlarged abdominal or pelvic lymph nodes. Reproductive: Status post hysterectomy. Other: Evidence of prior ventral hernia mesh repair. No abdominopelvic ascites. Musculoskeletal: No acute or significant osseous findings. IMPRESSION: 1.  No acute noncontrast CT findings of the abdomen or pelvis. 2. Chronic, incidental, and postoperative findings including left nephrectomy and right lower quadrant renal transplant graft. Electronically Signed   By: Eddie Candle M.D.   On: 08/12/2018 14:23    EKG:   Orders placed or performed during the hospital encounter of 08/12/18  . ED EKG  . ED EKG  . EKG 12-Lead  . EKG 12-Lead    ASSESSMENT AND PLAN:   68 year old female with past medical history significant for chronic anemia, hypertension, dementia, history of uterine cancer presents to hospital secondary to persistent diarrhea.  1.  Acute diarrhea-C. difficile is negative, GI panel is negative.  Continue to monitor.  -  Could be secretory diarrhea , recent ABX use, no diarrhea now  2.  Acute UTI-cultures have been sent for.   on Rocephin  3.  Acute renal failure on CKD stage IV-Baseline creatinine seems to be around 1.8.  - on IV fluids, avoid nephrotoxins and monitor  4.  Anemia of chronic disease-hemoglobin is stable around 8.  No indication for transfusion.  Monitor  5.  History of renal transplant-patient on sirolimus and mycophenolate.  No changes will be made  6.  DVT prophylaxis-subcutaneous heparin  Physical therapy consulted- need home health at discharge  Possibly today   All the records are reviewed and case discussed with Care Management/Social Workerr. Management plans discussed with the patient, family and they are in agreement.  CODE STATUS: Full code  TOTAL TIME TAKING CARE OF THIS PATIENT: 37 minutes.   POSSIBLE D/C IN 1-2 DAYS, DEPENDING ON CLINICAL CONDITION.   Gladstone Lighter M.D on 08/14/2018 at 8:56 AM  Between 7am to 6pm - Pager - [phone]  After 6pm go to [email or web address] - password [email or web address] [place] [facility]  Office  [phone]  CC: Primary care physician; Center, [facility]

## 2018-08-14 NOTE — Care Management (Signed)
Patient has dementia which requires head and feet to be positioned in ways not feasible with a normal bed. Head must be elevated at least 30 or more, if not patient becomes agitated or has pain. Dementia  Frequently requires changes in body position which cannot be achieved with a normal bed

## 2018-08-14 NOTE — TOC Progression Note (Signed)
Transition of Care [facility]) - Progression Note    Patient Details  Name: [name] MRN: [id number] Date of Birth: 07/23/1950  Transition [facility] Care [facility]) CM/SW Contact  Beverly Sessions, RN Phone Number: 08/14/2018, 3:48 PM  Clinical Narrative:     Per MD plan to discharge tomorrow.  RNCM followed with up with daughter at request of MD.  Daughter in agreement for discharge home tomorrow with home health services. Daughter states they were working on getting a hospital bed arranged from the PCP office, and inquires if there is a way for it to be expedited through the hospital.    MD has ordered hospital bed.  Referral made to [facility] with [facility], and notified of anticipated discharge for tomorrow.   Expected Discharge Plan: [place] Barriers to Discharge: Continued Medical Work up  Expected Discharge Plan and Services Expected Discharge Plan: [place] In-house Referral: Clinical Social Work   Post Acute Care Choice: [place] arrangements for the past 2 months: [place]                 DME Arranged: Hospital bed DME Agency: [employer] Date DME Agency Contacted: 08/14/18 Time DME Agency Contacted: 75 Representative spoke with at DME Agency: [place]: PT, RN [place] Agency: [place] ([place]) Date [place]: 08/13/18 Time [place]: [place] Representative spoke with at [place]: [place] ([place]) Interventions    Readmission Risk Interventions No flowsheet data found.

## 2018-08-14 NOTE — Progress Notes (Signed)
Spoke to daughter today- updated plan, daughter requests more help and equipment at home Discharge tomorrow AM

## 2018-08-15 MED ORDER — FUROSEMIDE 20 MG PO TABS: 20.0000 mg | ORAL_TABLET | Freq: Every day | ORAL | 0 refills | Status: DC

## 2018-08-15 MED ORDER — FLUCONAZOLE 100 MG PO TABS: 100.0000 mg | ORAL_TABLET | Freq: Every day | ORAL | 0 refills | Status: AC

## 2018-08-15 MED ORDER — RISAQUAD PO CAPS: 1.0000 | ORAL_CAPSULE | Freq: Every day | ORAL | 0 refills | Status: AC

## 2018-08-15 MED ORDER — AMLODIPINE BESYLATE 5 MG PO TABS
5.0000 mg | ORAL_TABLET | Freq: Every day | ORAL | Status: DC
  Filled 2018-08-15: qty 1

## 2018-08-15 MED ORDER — POTASSIUM CHLORIDE ER 10 MEQ PO TBCR: 20.0000 meq | EXTENDED_RELEASE_TABLET | Freq: Every day | ORAL | 0 refills | Status: DC

## 2018-08-15 NOTE — Progress Notes (Signed)
Patient's medications returned for patient from pharmacy. Verified with 2nd RN- Beth R, due to patient's AMS. Discussed patient's discharge instructions with patient's daughter, Debarah Crape via phone. Discharge instructions and packet placed in patient's discharge bag.

## 2018-08-15 NOTE — Care Management Important Message (Signed)
Important Message  Patient Details  Name: [name] MRN: [id number] Date of Birth: 03-20-1950   Medicare Important Message Given:  Yes     Dannette Barbara 08/15/2018, 10:58 AM

## 2018-08-15 NOTE — Discharge Summary (Signed)
[place] at [place] NAME: [name]    MR#:  [id number]  DATE OF BIRTH:  18-Jul-1950  DATE OF ADMISSION:  08/12/2018   ADMITTING PHYSICIAN: Demetrios Loll, MD  DATE OF DISCHARGE: 08/15/2018  2:58 PM  PRIMARY CARE PHYSICIAN: [facility], [place]   ADMISSION DIAGNOSIS:   Acute kidney injury ([place]) [N17.9] Cystitis [N30.90] Diarrhea, unspecified type [R19.7]  DISCHARGE DIAGNOSIS:   Active Problems:   Renal failure (ARF), acute on chronic ([place])   SECONDARY DIAGNOSIS:   Past Medical History:  Diagnosis Date  . Anemia   . Cancer of kidney ([place])   . Dementia ([place])   . GERD (gastroesophageal reflux disease)   . Hypertension   . Renal disorder   . Uterine cancer [facility])     HOSPITAL COURSE:   68 year old female with past medical history significant for chronic anemia, hypertension, dementia, history of uterine cancer presents to hospital secondary to persistent diarrhea.  1.  Acute diarrhea-C. difficile is negative, GI panel is negative.  no further diarrhea  -  Could be secretory diarrhea , recent ABX use, no diarrhea now -Added probiotics  2.  Acute UTI-urine cultures growing only yeast.  Treat with Diflucan  3.  Acute renal failure on CKD stage IV-Baseline creatinine seems to be around 1.8.  -  avoid nephrotoxins -Proved with IV fluids  4.  Anemia of chronic disease-hemoglobin is stable around 8.  No indication for transfusion.  Monitor  5.  History of renal transplant-patient on sirolimus and mycophenolate.  No changes will be made  6.    Dementia-seems to be at baseline.  Continue Aricept  Physical therapy consulted- need home health at discharge Discharge today   DISCHARGE CONDITIONS:   Guarded  CONSULTS OBTAINED:   None  DRUG ALLERGIES:   Allergies  Allergen Reactions  . Ace Inhibitors Cough  . Lisinopril Cough   DISCHARGE MEDICATIONS:   Allergies as of 08/15/2018      Reactions   Ace Inhibitors Cough   Lisinopril Cough      Medication List    STOP taking these medications   ciprofloxacin 500 MG tablet Commonly known as: Cipro     TAKE these medications   acidophilus Caps capsule Take 1 capsule by mouth daily for 15 days. Start taking on: August 16, 2018   atorvastatin 40 MG tablet Commonly known as: LIPITOR Take 40 mg by mouth daily.   calcitRIOL 0.25 MCG capsule Commonly known as: ROCALTROL Take 0.25 mcg by mouth daily.   cholecalciferol 25 MCG (1000 UT) tablet Commonly known as: VITAMIN D3 Take 1,000 Units by mouth daily.   citalopram 20 MG tablet Commonly known as: CELEXA Take 20 mg by mouth daily.   donepezil 5 MG tablet Commonly known as: ARICEPT Take 5 mg by mouth at bedtime.   fluconazole 100 MG tablet Commonly known as: DIFLUCAN Take 1 tablet (100 mg total) by mouth daily for 5 days. Start taking on: August 15, 6544   folic acid 1 MG tablet Commonly known as: FOLVITE Take 1 mg by mouth 2 (two) times daily.   furosemide 20 MG tablet Commonly known as: LASIX Take 1 tablet (20 mg total) by mouth daily. Start taking on: August 17, 2018 What changed:   when to take this  These instructions start on August 17, 2018. If you are unsure what to do until then, ask your doctor or other care provider.   Melatonin 10 MG Tabs  Take 20 mg by mouth at bedtime.   meloxicam 15 MG tablet Commonly known as: MOBIC Take 15 mg by mouth daily.   metoprolol succinate 25 MG 24 hr tablet Commonly known as: TOPROL-XL Take 1 tablet (25 mg total) by mouth daily.   mycophenolate 180 MG EC tablet Commonly known as: MYFORTIC Take 180 mg by mouth 2 (two) times daily.   potassium chloride 10 MEQ tablet Commonly known as: K-DUR Take 2 tablets (20 mEq total) by mouth daily. ONLY WHILE TAKING LASIX Start taking on: August 17, 2018 What changed:   when to take this  additional instructions  These instructions start on August 17, 2018. If you are unsure what to  do until then, ask your doctor or other care provider.   sirolimus 1 MG tablet Commonly known as: RAPAMUNE Take 3 mg by mouth daily.   Tums Ultra 1000 400 MG chewable tablet Generic drug: calcium elemental as carbonate Chew 1,000 mg by mouth daily as needed for heartburn.            Durable Medical Equipment  (From admission, onward)         Start     Ordered   08/14/18 1445  For home use only DME Hospital bed  Once    Question Answer Comment  Length of Need Lifetime   Head must be elevated greater than: 30 degrees   Bed type Semi-electric   Support Surface: Gel Overlay      08/14/18 1444   08/14/18 0932  For home use only DME Cane  Once     08/14/18 0931           DISCHARGE INSTRUCTIONS:   1. PCP f/u in 1-2 weeks  DIET:   Cardiac diet  ACTIVITY:   Activity as tolerated  OXYGEN:   Home Oxygen: No.  Oxygen Delivery: room air  DISCHARGE LOCATION:   home   If you experience worsening of your admission symptoms, develop shortness of breath, life threatening emergency, suicidal or homicidal thoughts you must seek medical attention immediately by calling 911 or calling your MD immediately  if symptoms less severe.  You Must read complete instructions/literature along with all the possible adverse reactions/side effects for all the Medicines you take and that have been prescribed to you. Take any new Medicines after you have completely understood and accpet all the possible adverse reactions/side effects.   Please note  You were cared for by a hospitalist during your hospital stay. If you have any questions about your discharge medications or the care you received while you were in the hospital after you are discharged, you can call the unit and asked to speak with the hospitalist on call if the hospitalist that took care of you is not available. Once you are discharged, your primary care physician will handle any further medical issues. Please note that NO  REFILLS for any discharge medications will be authorized once you are discharged, as it is imperative that you return to your primary care physician (or establish a relationship with a primary care physician if you do not have one) for your aftercare needs so that they can reassess your need for medications and monitor your lab values.    On the day of Discharge:  VITAL SIGNS:   Blood pressure 121/86, pulse (!) 59, temperature 97.8 F (36.6 C), temperature source Oral, resp. rate 18, height 4\' 11"  (1.499 m), weight 112 kg, SpO2 100 %.  PHYSICAL EXAMINATION:    GENERAL:  68 y.o.-year-old patient lying in the bed with no acute distress.  EYES: Pupils equal, round, reactive to light and accommodation. No scleral icterus. Extraocular muscles intact.  HEENT: Head atraumatic, normocephalic. Oropharynx and nasopharynx clear.  NECK:  Supple, no jugular venous distention. No thyroid enlargement, no tenderness.  LUNGS: Normal breath sounds bilaterally, no wheezing, rales,rhonchi or crepitation. No use of accessory muscles of respiration.  Decreased bibasilar breath sounds noted CARDIOVASCULAR: S1, S2 normal. No  rubs, or gallops.  2/6 systolic murmur is present ABDOMEN: Soft, nontender, nondistended. Bowel sounds present. No organomegaly or mass.  EXTREMITIES: No pedal edema, cyanosis, or clubbing.  NEUROLOGIC: Cranial nerves II through XII are intact. Muscle strength 5/5 in all extremities. Sensation intact. Gait not checked.  Global Weakness noted PSYCHIATRIC: The patient is alert and oriented x 2.  SKIN: No obvious rash, lesion, or ulcer.   DATA REVIEW:   CBC Recent Labs  Lab 08/13/18 0454  WBC 4.0  HGB 8.2*  HCT 27.5*  PLT 212    Chemistries  Recent Labs  Lab 08/12/18 1134 08/12/18 1138  08/14/18 0842  NA 141  --    < > 140  K 4.6  --    < > 4.7  CL 110  --    < > 110  CO2 22  --    < > 21*  GLUCOSE 93  --    < > 101*  BUN 33*  --    < > 18  CREATININE 2.36*  --    < >  1.56*  CALCIUM 8.4*  --    < > 8.5*  MG  --  2.3  --   --   AST 21  --   --   --   ALT 16  --   --   --   ALKPHOS 56  --   --   --   BILITOT 0.4  --   --   --    < > = values in this interval not displayed.     Microbiology Results  Results for orders placed or performed during the hospital encounter of 08/12/18  Gastrointestinal Panel by PCR , Stool     Status: None   Collection Time: 08/12/18 11:38 AM   Specimen: Stool  Result Value Ref Range Status   Campylobacter species NOT DETECTED NOT DETECTED Final   Plesimonas shigelloides NOT DETECTED NOT DETECTED Final   Salmonella species NOT DETECTED NOT DETECTED Final   Yersinia enterocolitica NOT DETECTED NOT DETECTED Final   Vibrio species NOT DETECTED NOT DETECTED Final   Vibrio cholerae NOT DETECTED NOT DETECTED Final   Enteroaggregative E coli (EAEC) NOT DETECTED NOT DETECTED Final   Enteropathogenic E coli (EPEC) NOT DETECTED NOT DETECTED Final   Enterotoxigenic E coli (ETEC) NOT DETECTED NOT DETECTED Final   Shiga like toxin producing E coli (STEC) NOT DETECTED NOT DETECTED Final   Shigella/Enteroinvasive E coli (EIEC) NOT DETECTED NOT DETECTED Final   Cryptosporidium NOT DETECTED NOT DETECTED Final   Cyclospora cayetanensis NOT DETECTED NOT DETECTED Final   Entamoeba histolytica NOT DETECTED NOT DETECTED Final   Giardia lamblia NOT DETECTED NOT DETECTED Final   Adenovirus F40/41 NOT DETECTED NOT DETECTED Final   Astrovirus NOT DETECTED NOT DETECTED Final   Norovirus GI/GII NOT DETECTED NOT DETECTED Final   Rotavirus A NOT DETECTED NOT DETECTED Final   Sapovirus (I, II, IV, and V) NOT DETECTED NOT DETECTED Final    Comment: Performed at [facility], [place], [place] [address]  C difficile quick scan w PCR reflex     Status: None   Collection Time: 08/12/18 11:38 AM   Specimen: Stool  Result Value Ref Range Status   C Diff antigen NEGATIVE NEGATIVE Final   C Diff toxin NEGATIVE NEGATIVE Final    C Diff interpretation No C. difficile detected.  Final    Comment: Performed at [facility], [address] [place], [place] [address]  Urine Culture     Status: Abnormal   Collection Time: 08/12/18 11:38 AM   Specimen: Urine, Random  Result Value Ref Range Status   Specimen Description   Final    URINE, RANDOM Performed at [facility], [address], [place], [place] [address]    Special Requests   Final    NONE Performed at [facility], [address] [place], [place] [address]    Culture >=100,000 COLONIES/mL YEAST (A)  Final   Report Status 08/14/2018 FINAL  Final  SARS Coronavirus 2 (CEPHEID - Performed in [place] hospital lab), Hosp Order     Status: None   Collection Time: 08/12/18  3:25 PM   Specimen: Nasopharyngeal Swab  Result Value Ref Range Status   SARS Coronavirus 2 NEGATIVE NEGATIVE Final    Comment: (NOTE) If result is NEGATIVE SARS-CoV-2 target nucleic acids are NOT DETECTED. The SARS-CoV-2 RNA is generally detectable in upper and lower  respiratory specimens during the acute phase of infection. The lowest  concentration of SARS-CoV-2 viral copies this assay can detect is 250  copies / mL. A negative result does not preclude SARS-CoV-2 infection  and should not be used as the sole basis for treatment or other  patient management decisions.  A negative result may occur with  improper specimen collection / handling, submission of specimen other  than nasopharyngeal swab, presence of viral mutation(s) within the  areas targeted by this assay, and inadequate number of viral copies  (<250 copies / mL). A negative result must be combined with clinical  observations, patient history, and epidemiological information. If result is POSITIVE SARS-CoV-2 target nucleic acids are DETECTED. The SARS-CoV-2 RNA is generally detectable in upper and lower  respiratory specimens dur ing the acute phase of infection.  Positive  results are  indicative of active infection with SARS-CoV-2.  Clinical  correlation with patient history and other diagnostic information is  necessary to determine patient infection status.  Positive results do  not rule out bacterial infection or co-infection with other viruses. If result is PRESUMPTIVE POSTIVE SARS-CoV-2 nucleic acids MAY BE PRESENT.   A presumptive positive result was obtained on the submitted specimen  and confirmed on repeat testing.  While 2019 novel coronavirus  (SARS-CoV-2) nucleic acids may be present in the submitted sample  additional confirmatory testing may be necessary for epidemiological  and / or clinical management purposes  to differentiate between  SARS-CoV-2 and other Sarbecovirus currently known to infect humans.  If clinically indicated additional testing with an alternate test  methodology [phone]) is advised. The SARS-CoV-2 RNA is generally  detectable in upper and lower respiratory sp ecimens during the acute  phase of infection. The expected result is Negative. Fact Sheet for Patients:  StrictlyIdeas.no Fact Sheet for Healthcare Providers: BankingDealers.co.za This test is not yet approved or cleared by the [place] FDA and has been authorized for detection and/or diagnosis of SARS-CoV-2 by FDA under an Emergency Use Authorization (EUA).  This EUA  will remain in effect (meaning this test can be used) for the duration of the COVID-19 declaration under Section 564(b)(1) of the Act, 21 U.S.C. section 360bbb-3(b)(1), unless the authorization is terminated or revoked sooner. Performed at [facility], [address] [place],  [address]     RADIOLOGY:  No results found.   Management plans discussed with the patient, family and they are in agreement.  CODE STATUS:     Code Status Orders  (From admission, onward)         Start     Ordered   08/12/18 1750  Full code  Continuous      08/12/18 1749        Code Status History    Date Active Date Inactive Code Status Order ID Comments User Context   06/20/2018 0041 06/21/2018 1616 Full Code [id number]  Lance Coon, MD Inpatient   03/23/2018 0357 03/26/2018 1620 Full Code [id number]  Harrie Foreman, MD Inpatient   11/21/2017 0839 11/23/2017 1708 Full Code [id number]  Fritzi Mandes, MD Inpatient   06/01/2017 1402 06/03/2017 1831 Full Code [id number]  Loletha Grayer, MD ED   06/29/2016 2314 07/02/2016 1712 Full Code [id number]  Lance Coon, MD Inpatient   07/06/2014 2201 07/08/2014 1537 Full Code [id number]  Hower, Aaron Mose, MD ED   Advance Care Planning Activity    Advance Directive Documentation     Most Recent Value  Type of Advance Directive  Healthcare Power of Attorney  Pre-existing out of facility DNR order (yellow form or pink MOST form)  -  "MOST" Form in Place?  -      TOTAL TIME TAKING CARE OF THIS PATIENT: 38  minutes.    Gladstone Lighter M.D on 08/15/2018 at 3:22 PM  Between 7am to 6pm - Pager - [phone]  After 6pm go to [email or web address] - Technical brewer [place] [facility]  Office  [phone]  CC: Primary care physician; [facility], [facility]   Note: This dictation was prepared with Dragon dictation along with smaller phrase technology. Any transcriptional errors that result from this process are unintentional.

## 2018-08-15 NOTE — TOC Transition Note (Signed)
Transition of Care [facility]) - CM/SW Discharge Note   Patient Details  Name: [name] MRN: [id number] Date of Birth: 12/06/1950  [facility] CM/SW Contact:  Beverly Sessions, RN Phone Number: 08/15/2018, 2:27 PM   Clinical Narrative:    Patient to discharge today.  Family has confirmed hospital bed has been delivered. Family to transport at discharge.  Corene Cornea with [place] notified of discharge  Final next level of care: [place] Barriers to Discharge: Barriers Resolved   Patient Goals and CMS Choice Patient states their goals for this hospitalization and ongoing recovery are:: Patient has dementia, daughter main contact plan to return back home with home health. CMS Medicare.gov Compare Post Acute Care list provided to:: Patient Represenative (must comment) Choice offered to / list presented to : Adult Children  Discharge Placement                       Discharge Plan and Services In-house Referral: Clinical Social Work   Post Acute Care Choice: Home Health          DME Arranged: Hospital bed DME Agency: [employer] Date DME Agency Contacted: 08/14/18 Time DME Agency Contacted: 60 Representative spoke with at DME Agency: [place]: RN, PT, Nurse's Aide [place] Agency: [place] ([place]) Date [place]: 08/15/18 Time [place]: 1417 Representative spoke with at [place]: [place] ([place]) Interventions     Readmission Risk Interventions Readmission Risk Prevention Plan 08/15/2018  Transportation Screening Complete  PCP or Specialist Appt within 3-5 Days Complete  HRI or Home Care Consult Complete  Palliative Care Screening Not Applicable  Medication Review (RN Care Manager) Complete  Some recent data might be hidden

## 2018-09-08 ENCOUNTER — Emergency Department
Admission: EM | Admit: 2018-09-08 | Discharge: 2018-09-09 | Disposition: A | Discharge status: Home / Self Care | Payer: Medicare Other | Attending: Emergency Medicine | Admitting: Emergency Medicine

## 2018-09-08 ENCOUNTER — Other Ambulatory Visit: Payer: Self-pay

## 2018-09-08 ENCOUNTER — Encounter: Payer: Self-pay | Admitting: Emergency Medicine

## 2018-09-08 DIAGNOSIS — N3000 Acute cystitis without hematuria: Secondary | ICD-10-CM | POA: Diagnosis not present

## 2018-09-08 DIAGNOSIS — I10 Essential (primary) hypertension: Secondary | ICD-10-CM | POA: Diagnosis not present

## 2018-09-08 DIAGNOSIS — Z940 Kidney transplant status: Secondary | ICD-10-CM | POA: Diagnosis not present

## 2018-09-08 DIAGNOSIS — Z20828 Contact with and (suspected) exposure to other viral communicable diseases: Secondary | ICD-10-CM | POA: Insufficient documentation

## 2018-09-08 DIAGNOSIS — R509 Fever, unspecified: Secondary | ICD-10-CM | POA: Diagnosis present

## 2018-09-08 DIAGNOSIS — Z87891 Personal history of nicotine dependence: Secondary | ICD-10-CM | POA: Diagnosis not present

## 2018-09-08 DIAGNOSIS — F0390 Unspecified dementia without behavioral disturbance: Secondary | ICD-10-CM | POA: Diagnosis not present

## 2018-09-08 LAB — COMPREHENSIVE METABOLIC PANEL
ALT: 16 U/L (ref 0–44)
AST: 18 U/L (ref 15–41)
Albumin: 3.5 g/dL (ref 3.5–5.0)
Alkaline Phosphatase: 56 U/L (ref 38–126)
Anion gap: 9 (ref 5–15)
BUN: 30 mg/dL — ABNORMAL HIGH (ref 8–23)
CO2: 23 mmol/L (ref 22–32)
Calcium: 8.5 mg/dL — ABNORMAL LOW (ref 8.9–10.3)
Chloride: 106 mmol/L (ref 98–111)
Creatinine, Ser: 2.18 mg/dL — ABNORMAL HIGH (ref 0.44–1.00)
GFR calc Af Amer: 26 mL/min — ABNORMAL LOW (ref >60)
GFR calc non Af Amer: 23 mL/min — ABNORMAL LOW (ref >60)
Glucose, Bld: 124 mg/dL — ABNORMAL HIGH (ref 70–99)
Potassium: 4.4 mmol/L (ref 3.5–5.1)
Sodium: 138 mmol/L (ref 135–145)
Total Bilirubin: 0.5 mg/dL (ref 0.3–1.2)
Total Protein: 7.3 g/dL (ref 6.5–8.1)

## 2018-09-08 LAB — CBC WITH DIFFERENTIAL/PLATELET
Abs Immature Granulocytes: 0.03 10*3/uL (ref 0.00–0.07)
Basophils Absolute: 0 10*3/uL (ref 0.0–0.1)
Basophils Relative: 0 %
Eosinophils Absolute: 0.1 10*3/uL (ref 0.0–0.5)
Eosinophils Relative: 1 %
HCT: 28.9 % — ABNORMAL LOW (ref 36.0–46.0)
Hemoglobin: 8.7 g/dL — ABNORMAL LOW (ref 12.0–15.0)
Immature Granulocytes: 0 %
Lymphocytes Relative: 10 %
Lymphs Abs: 0.8 10*3/uL (ref 0.7–4.0)
MCH: 23.6 pg — ABNORMAL LOW (ref 26.0–34.0)
MCHC: 30.1 g/dL (ref 30.0–36.0)
MCV: 78.3 fL — ABNORMAL LOW (ref 80.0–100.0)
Monocytes Absolute: 0.7 10*3/uL (ref 0.1–1.0)
Monocytes Relative: 8 %
Neutro Abs: 6.8 10*3/uL (ref 1.7–7.7)
Neutrophils Relative %: 81 %
Platelets: 175 10*3/uL (ref 150–400)
RBC: 3.69 MIL/uL — ABNORMAL LOW (ref 3.87–5.11)
RDW: 15.9 % — ABNORMAL HIGH (ref 11.5–15.5)
WBC: 8.4 10*3/uL (ref 4.0–10.5)
nRBC: 0 % (ref 0.0–0.2)

## 2018-09-08 LAB — URINALYSIS, COMPLETE (UACMP) WITH MICROSCOPIC
Bilirubin Urine: NEGATIVE
Glucose, UA: NEGATIVE mg/dL
Ketones, ur: NEGATIVE mg/dL
Nitrite: NEGATIVE
Protein, ur: 100 mg/dL — AB
Specific Gravity, Urine: 1.01 (ref 1.005–1.030)
WBC, UA: >50 WBC/hpf — ABNORMAL HIGH (ref 0–5)
pH: 5 (ref 5.0–8.0)

## 2018-09-08 LAB — TROPONIN I (HIGH SENSITIVITY): Troponin I (High Sensitivity): 7 ng/L (ref <18)

## 2018-09-08 LAB — SARS CORONAVIRUS 2 BY RT PCR (HOSPITAL ORDER, PERFORMED IN ~~LOC~~ HOSPITAL LAB): SARS Coronavirus 2: NEGATIVE

## 2018-09-08 MED ORDER — SODIUM CHLORIDE 0.9 % IV SOLN
1.0000 g | Freq: Once | INTRAVENOUS | Status: AC
  Administered 2018-09-08: 1 g via INTRAVENOUS
  Filled 2018-09-08: qty 10

## 2018-09-08 NOTE — ED Notes (Signed)
Pt cleaned of urine and stool and purewick placed.

## 2018-09-08 NOTE — ED Provider Notes (Signed)
[facility] Emergency Department Provider Note       Time seen: ----------------------------------------- 8:57 PM on 09/08/2018 -----------------------------------------  Level V caveat: History/ROS limited by dementia I have reviewed the triage vital signs and the nursing notes.  HISTORY   Chief Complaint Fever    HPI [name] is a 68 y.o. female with a history of anemia, kidney cancer, dementia, GERD, hypertension, renal disorder, uterine cancer who presents to the ED for twitching of her hands.  Patient is not sure why she is here, there was concerns from the daughter over twitching which appears to have resolved at this time.  Patient currently being treated for UTI on antibiotics.  She denies other complaints.  Past Medical History:  Diagnosis Date  . Anemia   . Cancer of kidney ([place])   . Dementia ([place])   . GERD (gastroesophageal reflux disease)   . Hypertension   . Renal disorder   . Uterine cancer [facility])     Patient Active Problem List   Diagnosis Date Noted  . Renal failure (ARF), acute on chronic ([place]) 08/12/2018  . Confusion 03/23/2018  . Hypokalemia 06/01/2017  . Sepsis ([place]) 06/29/2016  . UTI (urinary tract infection) 06/29/2016  . GERD (gastroesophageal reflux disease) 06/29/2016  . Dementia ([place]) 06/29/2016  . 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 Essential hypertension 07/06/2014  . H/O kidney transplant 07/06/2014    Past Surgical History:  Procedure Laterality Date  . ABDOMINAL HYSTERECTOMY    . AV FISTULA PLACEMENT    . BREAST BIOPSY     Patient is unsure but she thinks she remember a Bx  . BREAST BIOPSY Right 2017   benign  . KIDNEY TRANSPLANT    . NEPHRECTOMY TRANSPLANTED ORGAN      Allergies Ace inhibitors and Lisinopril  Social History Social History   Tobacco Use  . Smoking status: Former Research scientist (life sciences)  . Smokeless tobacco: Never Used  Substance Use Topics  . Alcohol use: No  . Drug use: No    Review of Systems Review of systems otherwise unknown Neurological: Positive for tremor or twitching  All systems negative/normal/unremarkable except as stated in the HPI  ____________________________________________   PHYSICAL EXAM:  VITAL SIGNS: ED Triage Vitals  Enc Vitals Group     BP      Pulse      Resp      Temp      Temp src      SpO2      Weight      Height      Head Circumference      Peak Flow      Pain Score      Pain Loc      Pain Edu?      Excl. in [place]?     Constitutional: Alert, well appearing and in no distress. Eyes: Conjunctivae are normal. Normal extraocular movements. Cardiovascular: Normal rate, regular rhythm. No murmurs, rubs, or gallops. Respiratory: Normal respiratory effort without tachypnea nor retractions. Breath sounds are clear and equal bilaterally. No wheezes/rales/rhonchi. Gastrointestinal: Soft and nontender. Normal bowel sounds Musculoskeletal: Nontender with normal range of motion in extremities. No lower extremity tenderness nor edema. Neurologic:  Normal speech and language. No gross focal neurologic deficits are appreciated.  No tremor or twitching is noted Skin:  Skin is warm, dry and intact. No rash noted. Psychiatric: Mood and affect are normal. Speech and behavior are normal.  ____________________________________________  ED COURSE:  As part of my medical decision  making, I reviewed the following data within the [place] History obtained from family if available, nursing notes, old chart and ekg, as well as notes from prior ED visits. Patient presented for twitching and medical screening exam, we will assess with labs as indicated at this time.  EKG: Interpreted by me, sinus rhythm the rate of 82 bpm, PVCs, LVH, leftward axis   Procedures  [name] was evaluated in Emergency Department on 09/08/2018 for the symptoms described in the history of present illness. She was evaluated in the context of  the global COVID-19 pandemic, which necessitated consideration that the patient might be at risk for infection with the SARS-CoV-2 virus that causes COVID-19. Institutional protocols and algorithms that pertain to the evaluation of patients at risk for COVID-19 are in a state of rapid change based on information released by regulatory bodies including the CDC and federal and state organizations. These policies and algorithms were followed during the patient's care in the ED.  ____________________________________________   LABS (pertinent positives/negatives)  Labs Reviewed  CBC WITH DIFFERENTIAL/PLATELET - Abnormal; Notable for the following components:      Result Value   RBC 3.69 (*)    Hemoglobin 8.7 (*)    HCT 28.9 (*)    MCV 78.3 (*)    MCH 23.6 (*)    RDW 15.9 (*)    All other components within normal limits  COMPREHENSIVE METABOLIC PANEL - Abnormal; Notable for the following components:   Glucose, Bld 124 (*)    BUN 30 (*)    Creatinine, Ser 2.18 (*)    Calcium 8.5 (*)    GFR calc non Af Amer 23 (*)    GFR calc Af Amer 26 (*)    All other components within normal limits  URINALYSIS, COMPLETE (UACMP) WITH MICROSCOPIC - Abnormal; Notable for the following components:   Color, Urine YELLOW (*)    APPearance TURBID (*)    Hgb urine dipstick MODERATE (*)    Protein, ur 100 (*)    Leukocytes,Ua LARGE (*)    WBC, UA >50 (*)    Bacteria, UA RARE (*)    All other components within normal limits  SARS CORONAVIRUS 2 (HOSPITAL ORDER, [place] [facility])  URINE CULTURE  TROPONIN I (HIGH SENSITIVITY)   ____________________________________________   DIFFERENTIAL DIAGNOSIS   Tremor, dementia, dehydration, electrolyte abnormality, occult infection  FINAL ASSESSMENT AND PLAN  UTI   Plan: The patient had presented for tremor or twitching that was noted by family. Patient's labs did indicate a urinary tract infection without other acute process.  She has chronic  laboratory abnormalities including chronic kidney disease and chronic anemia.  She was given IV Rocephin here and is cleared for outpatient follow-up.Laurence Aly, MD    Note: This note was generated in part or whole with voice recognition software. Voice recognition is usually quite accurate but there are transcription errors that can and very often do occur. I apologize for any typographical errors that were not detected and corrected.     Earleen Newport, MD 09/08/18 [phone]

## 2018-09-08 NOTE — ED Triage Notes (Signed)
Ems called for bilateral "hand twitching" which had resolved on ems arrival. Pt dx with uti last week and is on an antibiotic. Febrile at 99.0. no complaints from pt

## 2018-09-08 NOTE — ED Notes (Signed)
Spoke with daughter. The pt just started her antibiotic today.

## 2018-09-08 NOTE — ED Notes (Signed)
Spoke with daughter Jerold Coombe at [phone]. Can call her back with any questions or with updates.

## 2018-09-09 NOTE — ED Notes (Signed)
[name], [phone], contacted to get pt, "on the way"

## 2018-09-10 LAB — URINE CULTURE: Special Requests: NORMAL

## 2018-09-22 ENCOUNTER — Other Ambulatory Visit: Payer: Self-pay

## 2018-09-22 ENCOUNTER — Inpatient Hospital Stay
Admission: EM | Admit: 2018-09-22 | Discharge: 2018-09-27 | DRG: 689 | Disposition: A | Discharge status: Home Health | Payer: Medicare Other | Attending: Internal Medicine | Admitting: Internal Medicine

## 2018-09-22 ENCOUNTER — Encounter: Payer: Self-pay | Admitting: Emergency Medicine

## 2018-09-22 ENCOUNTER — Emergency Department: Payer: Medicare Other

## 2018-09-22 DIAGNOSIS — Z85528 Personal history of other malignant neoplasm of kidney: Secondary | ICD-10-CM | POA: Diagnosis not present

## 2018-09-22 DIAGNOSIS — F0390 Unspecified dementia without behavioral disturbance: Secondary | ICD-10-CM | POA: Diagnosis present

## 2018-09-22 DIAGNOSIS — E860 Dehydration: Secondary | ICD-10-CM | POA: Diagnosis present

## 2018-09-22 DIAGNOSIS — H9201 Otalgia, right ear: Secondary | ICD-10-CM | POA: Diagnosis present

## 2018-09-22 DIAGNOSIS — D638 Anemia in other chronic diseases classified elsewhere: Secondary | ICD-10-CM | POA: Diagnosis present

## 2018-09-22 DIAGNOSIS — Z87891 Personal history of nicotine dependence: Secondary | ICD-10-CM

## 2018-09-22 DIAGNOSIS — A0472 Enterocolitis due to Clostridium difficile, not specified as recurrent: Secondary | ICD-10-CM | POA: Diagnosis present

## 2018-09-22 DIAGNOSIS — E785 Hyperlipidemia, unspecified: Secondary | ICD-10-CM | POA: Diagnosis present

## 2018-09-22 DIAGNOSIS — Z20828 Contact with and (suspected) exposure to other viral communicable diseases: Secondary | ICD-10-CM | POA: Diagnosis present

## 2018-09-22 DIAGNOSIS — Z79899 Other long term (current) drug therapy: Secondary | ICD-10-CM | POA: Diagnosis not present

## 2018-09-22 DIAGNOSIS — Z90710 Acquired absence of both cervix and uterus: Secondary | ICD-10-CM

## 2018-09-22 DIAGNOSIS — G9341 Metabolic encephalopathy: Secondary | ICD-10-CM | POA: Diagnosis present

## 2018-09-22 DIAGNOSIS — R269 Unspecified abnormalities of gait and mobility: Secondary | ICD-10-CM | POA: Diagnosis present

## 2018-09-22 DIAGNOSIS — R410 Disorientation, unspecified: Secondary | ICD-10-CM

## 2018-09-22 DIAGNOSIS — T8619 Other complication of kidney transplant: Secondary | ICD-10-CM | POA: Diagnosis present

## 2018-09-22 DIAGNOSIS — N183 Chronic kidney disease, stage 3 (moderate): Secondary | ICD-10-CM | POA: Diagnosis present

## 2018-09-22 DIAGNOSIS — Y830 Surgical operation with transplant of whole organ as the cause of abnormal reaction of the patient, or of later complication, without mention of misadventure at the time of the procedure: Secondary | ICD-10-CM | POA: Diagnosis present

## 2018-09-22 DIAGNOSIS — I129 Hypertensive chronic kidney disease with stage 1 through stage 4 chronic kidney disease, or unspecified chronic kidney disease: Secondary | ICD-10-CM | POA: Diagnosis present

## 2018-09-22 DIAGNOSIS — Z1621 Resistance to vancomycin: Secondary | ICD-10-CM | POA: Diagnosis present

## 2018-09-22 DIAGNOSIS — Z888 Allergy status to other drugs, medicaments and biological substances status: Secondary | ICD-10-CM | POA: Diagnosis not present

## 2018-09-22 DIAGNOSIS — N179 Acute kidney failure, unspecified: Secondary | ICD-10-CM | POA: Diagnosis present

## 2018-09-22 DIAGNOSIS — Z8542 Personal history of malignant neoplasm of other parts of uterus: Secondary | ICD-10-CM | POA: Diagnosis not present

## 2018-09-22 DIAGNOSIS — K219 Gastro-esophageal reflux disease without esophagitis: Secondary | ICD-10-CM | POA: Diagnosis present

## 2018-09-22 DIAGNOSIS — Z87440 Personal history of urinary (tract) infections: Secondary | ICD-10-CM | POA: Diagnosis not present

## 2018-09-22 DIAGNOSIS — N3001 Acute cystitis with hematuria: Secondary | ICD-10-CM | POA: Diagnosis present

## 2018-09-22 DIAGNOSIS — B952 Enterococcus as the cause of diseases classified elsewhere: Secondary | ICD-10-CM | POA: Diagnosis present

## 2018-09-22 DIAGNOSIS — Z791 Long term (current) use of non-steroidal anti-inflammatories (NSAID): Secondary | ICD-10-CM

## 2018-09-22 DIAGNOSIS — G9340 Encephalopathy, unspecified: Secondary | ICD-10-CM | POA: Diagnosis present

## 2018-09-22 DIAGNOSIS — Z823 Family history of stroke: Secondary | ICD-10-CM

## 2018-09-22 DIAGNOSIS — Z811 Family history of alcohol abuse and dependence: Secondary | ICD-10-CM

## 2018-09-22 DIAGNOSIS — R4182 Altered mental status, unspecified: Secondary | ICD-10-CM | POA: Diagnosis present

## 2018-09-22 DIAGNOSIS — L8990 Pressure ulcer of unspecified site, unspecified stage: Secondary | ICD-10-CM | POA: Insufficient documentation

## 2018-09-22 LAB — CBC
HCT: 28.7 % — ABNORMAL LOW (ref 36.0–46.0)
Hemoglobin: 8.6 g/dL — ABNORMAL LOW (ref 12.0–15.0)
MCH: 23.2 pg — ABNORMAL LOW (ref 26.0–34.0)
MCHC: 30 g/dL (ref 30.0–36.0)
MCV: 77.6 fL — ABNORMAL LOW (ref 80.0–100.0)
Platelets: 195 10*3/uL (ref 150–400)
RBC: 3.7 MIL/uL — ABNORMAL LOW (ref 3.87–5.11)
RDW: 16.2 % — ABNORMAL HIGH (ref 11.5–15.5)
WBC: 7.8 10*3/uL (ref 4.0–10.5)
nRBC: 0 % (ref 0.0–0.2)

## 2018-09-22 LAB — URINALYSIS, COMPLETE (UACMP) WITH MICROSCOPIC
Bacteria, UA: NONE SEEN
Bilirubin Urine: NEGATIVE
Glucose, UA: NEGATIVE mg/dL
Ketones, ur: NEGATIVE mg/dL
Nitrite: NEGATIVE
Protein, ur: 100 mg/dL — AB
RBC / HPF: >50 RBC/hpf — ABNORMAL HIGH (ref 0–5)
Specific Gravity, Urine: 1.013 (ref 1.005–1.030)
Squamous Epithelial / HPF: NONE SEEN (ref 0–5)
WBC, UA: >50 WBC/hpf — ABNORMAL HIGH (ref 0–5)
pH: 5 (ref 5.0–8.0)

## 2018-09-22 LAB — COMPREHENSIVE METABOLIC PANEL
ALT: 12 U/L (ref 0–44)
AST: 19 U/L (ref 15–41)
Albumin: 3.3 g/dL — ABNORMAL LOW (ref 3.5–5.0)
Alkaline Phosphatase: 66 U/L (ref 38–126)
Anion gap: 12 (ref 5–15)
BUN: 30 mg/dL — ABNORMAL HIGH (ref 8–23)
CO2: 23 mmol/L (ref 22–32)
Calcium: 8.2 mg/dL — ABNORMAL LOW (ref 8.9–10.3)
Chloride: 105 mmol/L (ref 98–111)
Creatinine, Ser: 2.5 mg/dL — ABNORMAL HIGH (ref 0.44–1.00)
GFR calc Af Amer: 22 mL/min — ABNORMAL LOW (ref >60)
GFR calc non Af Amer: 19 mL/min — ABNORMAL LOW (ref >60)
Glucose, Bld: 124 mg/dL — ABNORMAL HIGH (ref 70–99)
Potassium: 4.4 mmol/L (ref 3.5–5.1)
Sodium: 140 mmol/L (ref 135–145)
Total Bilirubin: 0.7 mg/dL (ref 0.3–1.2)
Total Protein: 7.6 g/dL (ref 6.5–8.1)

## 2018-09-22 LAB — LACTIC ACID, PLASMA: Lactic Acid, Venous: 1.2 mmol/L (ref 0.5–1.9)

## 2018-09-22 MED ORDER — MELATONIN 5 MG PO TABS
20.0000 mg | ORAL_TABLET | Freq: Every day | ORAL | Status: DC
  Administered 2018-09-22 – 2018-09-26 (×5): 20 mg via ORAL
  Filled 2018-09-22 (×6): qty 4

## 2018-09-22 MED ORDER — ONDANSETRON HCL 4 MG PO TABS: 4.0000 mg | ORAL_TABLET | Freq: Four times a day (QID) | ORAL | Status: DC | PRN

## 2018-09-22 MED ORDER — HYDROCODONE-ACETAMINOPHEN 5-325 MG PO TABS: 1.0000 | ORAL_TABLET | ORAL | Status: DC | PRN

## 2018-09-22 MED ORDER — ATORVASTATIN CALCIUM 20 MG PO TABS
40.0000 mg | ORAL_TABLET | Freq: Every day | ORAL | Status: DC
  Administered 2018-09-23 – 2018-09-27 (×5): 40 mg via ORAL
  Filled 2018-09-22 (×5): qty 2

## 2018-09-22 MED ORDER — ENOXAPARIN SODIUM 30 MG/0.3ML ~~LOC~~ SOLN
30.0000 mg | SUBCUTANEOUS | Status: DC
  Administered 2018-09-22: 30 mg via SUBCUTANEOUS
  Filled 2018-09-22: qty 0.3

## 2018-09-22 MED ORDER — DONEPEZIL HCL 5 MG PO TABS
5.0000 mg | ORAL_TABLET | Freq: Every day | ORAL | Status: DC
  Administered 2018-09-22 – 2018-09-26 (×5): 5 mg via ORAL
  Filled 2018-09-22 (×5): qty 1

## 2018-09-22 MED ORDER — SODIUM CHLORIDE 0.9 % IV SOLN
2.0000 g | Freq: Once | INTRAVENOUS | Status: AC
  Administered 2018-09-22: 14:00:00 2 g via INTRAVENOUS
  Filled 2018-09-22: qty 20

## 2018-09-22 MED ORDER — MYCOPHENOLATE SODIUM 180 MG PO TBEC
180.0000 mg | DELAYED_RELEASE_TABLET | Freq: Two times a day (BID) | ORAL | Status: DC
  Administered 2018-09-22 – 2018-09-27 (×10): 180 mg via ORAL
  Filled 2018-09-22 (×12): qty 1

## 2018-09-22 MED ORDER — SODIUM CHLORIDE 0.9 % IV SOLN: Freq: Once | INTRAVENOUS | Status: DC

## 2018-09-22 MED ORDER — SIROLIMUS 1 MG PO TABS
3.0000 mg | ORAL_TABLET | Freq: Every day | ORAL | Status: DC
  Administered 2018-09-23 – 2018-09-27 (×5): 3 mg via ORAL
  Filled 2018-09-22 (×5): qty 3

## 2018-09-22 MED ORDER — ACETAMINOPHEN 650 MG RE SUPP: 650.0000 mg | Freq: Four times a day (QID) | RECTAL | Status: DC | PRN

## 2018-09-22 MED ORDER — SODIUM CHLORIDE 0.9 % IV SOLN
1.0000 g | INTRAVENOUS | Status: DC
  Administered 2018-09-23 – 2018-09-24 (×2): 1 g via INTRAVENOUS
  Filled 2018-09-22 (×2): qty 1

## 2018-09-22 MED ORDER — ACETAMINOPHEN 325 MG PO TABS
650.0000 mg | ORAL_TABLET | Freq: Four times a day (QID) | ORAL | Status: DC | PRN
  Administered 2018-09-25: 650 mg via ORAL
  Filled 2018-09-22: qty 2

## 2018-09-22 MED ORDER — CITALOPRAM HYDROBROMIDE 20 MG PO TABS
20.0000 mg | ORAL_TABLET | Freq: Every day | ORAL | Status: DC
  Administered 2018-09-23 – 2018-09-27 (×5): 20 mg via ORAL
  Filled 2018-09-22 (×5): qty 1

## 2018-09-22 MED ORDER — CALCITRIOL 0.25 MCG PO CAPS
0.2500 ug | ORAL_CAPSULE | Freq: Every day | ORAL | Status: DC
  Administered 2018-09-23 – 2018-09-27 (×5): 0.25 ug via ORAL
  Filled 2018-09-22 (×5): qty 1

## 2018-09-22 MED ORDER — METOPROLOL TARTRATE 25 MG PO TABS
25.0000 mg | ORAL_TABLET | Freq: Two times a day (BID) | ORAL | Status: DC
  Administered 2018-09-22 – 2018-09-27 (×10): 25 mg via ORAL
  Filled 2018-09-22 (×11): qty 1

## 2018-09-22 MED ORDER — ONDANSETRON HCL 4 MG/2ML IJ SOLN: 4.0000 mg | Freq: Four times a day (QID) | INTRAMUSCULAR | Status: DC | PRN

## 2018-09-22 MED ORDER — FOLIC ACID 1 MG PO TABS
1.0000 mg | ORAL_TABLET | Freq: Two times a day (BID) | ORAL | Status: DC
  Administered 2018-09-22 – 2018-09-27 (×10): 1 mg via ORAL
  Filled 2018-09-22 (×10): qty 1

## 2018-09-22 MED ORDER — SODIUM CHLORIDE 0.9 % IV BOLUS
1000.0000 mL | Freq: Once | INTRAVENOUS | Status: AC
  Administered 2018-09-22: 13:00:00 1000 mL via INTRAVENOUS

## 2018-09-22 MED ORDER — SODIUM CHLORIDE 0.9 % IV SOLN
INTRAVENOUS | Status: DC
  Administered 2018-09-22 – 2018-09-25 (×5): via INTRAVENOUS

## 2018-09-22 MED ORDER — VITAMIN D 25 MCG (1000 UNIT) PO TABS
1000.0000 [IU] | ORAL_TABLET | Freq: Every day | ORAL | Status: DC
  Administered 2018-09-23 – 2018-09-27 (×5): 1000 [IU] via ORAL
  Filled 2018-09-22 (×5): qty 1

## 2018-09-22 NOTE — H&P (Signed)
[facility] [place] NAME: [name]    MR#:  [id number]  DATE OF BIRTH:  03-24-1950  DATE OF ADMISSION:  09/22/2018  PRIMARY CARE PHYSICIAN: [facility]   REQUESTING/REFERRING PHYSICIAN: Duffy Bruce, MD  CHIEF COMPLAINT:   Chief Complaint  Patient presents with  . Altered Mental Status    HISTORY OF PRESENT ILLNESS: [name]  is a 68 y.o. female with a known history of dementia, GERD, hypertension, chronic kidney disease history of renal transplant who was seen in the ER on 7/25 at that time was diagnosed with a UTI was discharged on oral antibiotics.  Patient is sent back to the ER because progressively less responsive and confused.  Patient currently is awake here but is confused.  She does have dementia.  Further review of system is unobtainable.  Patient does complain of some pain in the right ear.  PAST MEDICAL HISTORY:   Past Medical History:  Diagnosis Date  . Anemia   . Cancer of kidney ([place])   . Dementia ([place])   . GERD (gastroesophageal reflux disease)   . Hypertension   . Renal disorder   . Uterine cancer ([place])     PAST SURGICAL HISTORY:  Past Surgical History:  Procedure Laterality Date  . ABDOMINAL HYSTERECTOMY    . AV FISTULA PLACEMENT    . BREAST BIOPSY     Patient is unsure but she thinks she remember a Bx  . BREAST BIOPSY Right 2017   benign  . KIDNEY TRANSPLANT    . NEPHRECTOMY TRANSPLANTED ORGAN      SOCIAL HISTORY:  Social History   Tobacco Use  . Smoking status: Former Research scientist (life sciences)  . Smokeless tobacco: Never Used  Substance Use Topics  . Alcohol use: No    FAMILY HISTORY:  Family History  Problem Relation Age of Onset  . Stroke Mother   . Alcohol abuse Father   . Breast cancer Neg Hx     DRUG ALLERGIES:  Allergies  Allergen Reactions  . Ace Inhibitors Cough  . Lisinopril Cough    REVIEW OF SYSTEMS:   CONSTITUTIONAL: Limited due to dementia MEDICATIONS AT HOME:   Prior to Admission medications   Medication Sig Start Date End Date Taking? Authorizing Provider  atorvastatin (LIPITOR) 40 MG tablet Take 40 mg by mouth daily.    Yes [provider]  calcitRIOL (ROCALTROL) 0.25 MCG capsule Take 0.25 mcg by mouth daily.   Yes [provider]  calcium elemental as carbonate (TUMS ULTRA 1000) 400 MG chewable tablet Chew 1,000 mg by mouth daily as needed for heartburn.   Yes [provider]  cholecalciferol (VITAMIN D3) 25 MCG (1000 UT) tablet Take 1,000 Units by mouth daily.   Yes [provider]  citalopram (CELEXA) 20 MG tablet Take 20 mg by mouth daily.   Yes [provider]  donepezil (ARICEPT) 5 MG tablet Take 5 mg by mouth at bedtime.    Yes [provider]  folic acid (FOLVITE) 1 MG tablet Take 1 mg by mouth 2 (two) times daily.    Yes [provider]  furosemide (LASIX) 20 MG tablet Take 1 tablet (20 mg total) by mouth daily. 08/17/18  Yes Gladstone Lighter, MD  Melatonin 10 MG TABS Take 20 mg by mouth at bedtime.   Yes [provider]  meloxicam (MOBIC) 15 MG tablet Take 15 mg by mouth daily. 06/14/18  Yes [provider]  metoprolol tartrate (  LOPRESSOR) 25 MG tablet Take 25 mg by mouth 2 (two) times daily.   Yes [provider]  mycophenolate (MYFORTIC) 180 MG EC tablet Take 180 mg by mouth 2 (two) times daily.   Yes [provider]  potassium chloride (K-DUR) 10 MEQ tablet Take 2 tablets (20 mEq total) by mouth daily. ONLY WHILE TAKING LASIX 08/17/18  Yes Gladstone Lighter, MD  sirolimus (RAPAMUNE) 1 MG tablet Take 3 mg by mouth daily.    Yes [provider]  metoprolol succinate (TOPROL-XL) 25 MG 24 hr tablet Take 1 tablet (25 mg total) by mouth daily. Patient not taking: Reported on 09/22/2018 07/02/16   Gladstone Lighter, MD      PHYSICAL EXAMINATION:   VITAL SIGNS: Blood pressure 112/81, pulse 89, temperature 100.2 F (37.9 C), temperature  source Rectal, resp. rate 17, height 5' (1.524 m), weight 92.5 kg, SpO2 99 %.  GENERAL:  68 y.o.-year-old patient lying in the bed with no acute distress.  EYES: Pupils equal, round, reactive to light and accommodation. No scleral icterus. Extraocular muscles intact.  HEENT: Head atraumatic, normocephalic. Oropharynx and nasopharynx clear.  Right ear patient has a lot of wax NECK:  Supple, no jugular venous distention. No thyroid enlargement, no tenderness.  LUNGS: Normal breath sounds bilaterally, no wheezing, rales,rhonchi or crepitation. No use of accessory muscles of respiration.  CARDIOVASCULAR: S1, S2 normal. No murmurs, rubs, or gallops.  ABDOMEN: Soft, nontender, nondistended. Bowel sounds present. No organomegaly or mass.  EXTREMITIES: No pedal edema, cyanosis, or clubbing.  NEUROLOGIC: Cranial nerves II through XII are intact. Muscle strength 5/5 in all extremities. Sensation intact. Gait not checked.  PSYCHIATRIC: The patient is alert but not oriented  aSKIN: No obvious rash, lesion, or ulcer.   LABORATORY PANEL:   CBC Recent Labs  Lab 09/22/18 1134  WBC 7.8  HGB 8.6*  HCT 28.7*  PLT 195  MCV 77.6*  MCH 23.2*  MCHC 30.0  RDW 16.2*   ------------------------------------------------------------------------------------------------------------------  Chemistries  Recent Labs  Lab 09/22/18 1134  NA 140  K 4.4  CL 105  CO2 23  GLUCOSE 124*  BUN 30*  CREATININE 2.50*  CALCIUM 8.2*  AST 19  ALT 12  ALKPHOS 66  BILITOT 0.7   ------------------------------------------------------------------------------------------------------------------ estimated creatinine clearance is 22.2 mL/min (A) (by C-G formula based on SCr of 2.5 mg/dL (H)). ------------------------------------------------------------------------------------------------------------------ No results for input(s): TSH, T4TOTAL, T3FREE, THYROIDAB in the last 72 hours.  Invalid input(s):  FREET3   Coagulation profile No results for input(s): INR, PROTIME in the last 168 hours. ------------------------------------------------------------------------------------------------------------------- No results for input(s): DDIMER in the last 72 hours. -------------------------------------------------------------------------------------------------------------------  Cardiac Enzymes No results for input(s): CKMB, TROPONINI, MYOGLOBIN in the last 168 hours.  Invalid input(s): CK ------------------------------------------------------------------------------------------------------------------ Invalid input(s): POCBNP  ---------------------------------------------------------------------------------------------------------------  Urinalysis    Component Value Date/Time   COLORURINE YELLOW (A) 09/22/2018 1315   APPEARANCEUR TURBID (A) 09/22/2018 1315   APPEARANCEUR Clear 02/25/2014 1407   LABSPEC 1.013 09/22/2018 1315   LABSPEC 1.009 02/25/2014 1407   PHURINE 5.0 09/22/2018 1315   GLUCOSEU NEGATIVE 09/22/2018 1315   GLUCOSEU Negative 02/25/2014 1407   HGBUR MODERATE (A) 09/22/2018 1315   BILIRUBINUR NEGATIVE 09/22/2018 1315   BILIRUBINUR Negative 02/25/2014 1407   KETONESUR NEGATIVE 09/22/2018 1315   PROTEINUR 100 (A) 09/22/2018 1315   NITRITE NEGATIVE 09/22/2018 1315   LEUKOCYTESUR LARGE (A) 09/22/2018 1315   LEUKOCYTESUR Negative 02/25/2014 1407     RADIOLOGY: Dg Chest Portable 1 View  Result Date: 09/22/2018 CLINICAL DATA:  Weakness and confusion. EXAM: PORTABLE CHEST 1 VIEW COMPARISON:  08/01/2018 and prior radiographs FINDINGS: Cardiomegaly and pulmonary vascular congestion again noted. There is no evidence of focal airspace disease, pulmonary edema, suspicious pulmonary nodule/mass, pleural effusion, or pneumothorax. No acute bony abnormalities are identified. IMPRESSION: Cardiomegaly with pulmonary vascular congestion. Electronically Signed   By: Margarette Canada M.D.    On: 09/22/2018 13:34    EKG: Orders placed or performed during the hospital encounter of 09/22/18  . EKG 12-Lead  . EKG 12-Lead    IMPRESSION AND PLAN: Patient 68 year old African-American female with chronic kidney disease and renal transplant who is presenting with altered mental status  1. acute encephalopathy due to recurrent UTI  2.  UTI patient has had multiple urine cultures in the past the last one in July showed multiple organisms, yeast, Citrobacter we will treat with ceftriaxone  3.  Hyperlipidemia we will treat with Lipitor  4.  Essential hypertension treat with Lopressor  5.  Acute renal failure on chronic kidney disease status post renal transplant follow follow renal function, will give IV fluids will hold Lasix and Mobic  6.  Miscellaneous Lovenox for DVT prophylaxis       All the records are reviewed and case discussed with ED provider. Management plans discussed with the patient, family and they are in agreement.  CODE STATUS: Code Status History    Date Active Date Inactive Code Status Order ID Comments User Context   08/12/2018 [id number] 08/15/2018 1758 Full Code [id number]  Demetrios Loll, MD Inpatient   06/20/2018 0041 06/21/2018 1616 Full Code [id number]  Lance Coon, MD Inpatient   03/23/2018 0321 03/26/2018 1620 Full Code [id number]  Harrie Foreman, MD Inpatient   11/21/2017 0839 11/23/2017 1708 Full Code [id number]  Fritzi Mandes, MD Inpatient   06/01/2017 1402 06/03/2017 1831 Full Code [id number]  Loletha Grayer, MD ED   06/29/2016 2314 07/02/2016 1712 Full Code [id number]  Lance Coon, MD Inpatient   07/06/2014 2201 07/08/2014 1537 Full Code [id number]  Hower, Aaron Mose, MD ED   Advance Care Planning Activity       TOTAL TIME TAKING CARE OF THIS PATIENT: 55 minutes.    Dustin Flock M.D on 09/22/2018 at 2:49 PM  Between 7am to 6pm - Pager - [phone]  After 6pm go to [email or web address] - Proofreader  [employer] [facility]  [phone]  CC: Primary  care physician; [place]

## 2018-09-22 NOTE — ED Notes (Signed)
ED TO INPATIENT HANDOFF REPORT  ED Nurse Name and Phone #: Anderson Malta [phone]  S Name/Age/Gender [name] 68 y.o. female Room/Bed: ED16A/ED16A  Code Status   Code Status: Prior  Home/SNF/Other Home Patient oriented to: self, place and situation Is this baseline? Yes   Triage Complete: Triage complete  Chief Complaint altered mental status  Triage Note Pt arrives via EMS form home.  Caregiver stated pt had a sleeping pill several days ago that did not work at that time, but made pt groggy for the next couple of days.  Caregiver c/o to EMS was that pt was dehydrated and needed to be brought to ED.  Caregiver also c/o increasing confusion.  Pt has hx of dementia.  Pt voices no c/o at this time, alert and oriented to person/place.  Answers questions appropriately.   Allergies Allergies  Allergen Reactions  . Ace Inhibitors Cough  . Lisinopril Cough    Level of Care/Admitting Diagnosis ED Disposition    ED Disposition Condition [facility] Area: [place] [[id number]]  Level of Care: Med-Surg [16]  Covid Evaluation: Asymptomatic Screening Protocol (No Symptoms)  Diagnosis: Acute encephalopathy [867619]  Admitting Physician: Dustin Flock [[id number]]  Attending Physician: Dustin Flock [[id number]]  Estimated length of stay: past midnight tomorrow  Certification:: I certify this patient will need inpatient services for at least 2 midnights  PT Class (Do Not Modify): Inpatient [101]  PT Acc Code (Do Not Modify): Private [1]       B Medical/Surgery History Past Medical History:  Diagnosis Date  . Anemia   . Cancer of kidney ([place])   . Dementia ([place])   . GERD (gastroesophageal reflux disease)   . Hypertension   . Renal disorder   . Uterine cancer [facility])    Past Surgical History:  Procedure Laterality Date  . ABDOMINAL HYSTERECTOMY    . AV FISTULA PLACEMENT    . BREAST BIOPSY     Patient is unsure but she thinks she remember a  Bx  . BREAST BIOPSY Right 2017   benign  . KIDNEY TRANSPLANT    . NEPHRECTOMY TRANSPLANTED ORGAN       A IV Location/Drains/Wounds Patient Lines/Drains/Airways Status   Active Line/Drains/Airways    Name:   Placement date:   Placement time:   Site:   Days:   Peripheral IV 09/22/18 Right Antecubital   09/22/18    1131    Antecubital   less than 1          Intake/Output Last 24 hours No intake or output data in the 24 hours ending 09/22/18 1757  Labs/Imaging Results for orders placed or performed during the hospital encounter of 09/22/18 (from the past 48 hour(s))  Comprehensive metabolic panel     Status: Abnormal   Collection Time: 09/22/18 11:34 AM  Result Value Ref Range   Sodium 140 135 - 145 mmol/L   Potassium 4.4 3.5 - 5.1 mmol/L   Chloride 105 98 - 111 mmol/L   CO2 23 22 - 32 mmol/L   Glucose, Bld 124 (H) 70 - 99 mg/dL   BUN 30 (H) 8 - 23 mg/dL   Creatinine, Ser 2.50 (H) 0.44 - 1.00 mg/dL   Calcium 8.2 (L) 8.9 - 10.3 mg/dL   Total Protein 7.6 6.5 - 8.1 g/dL   Albumin 3.3 (L) 3.5 - 5.0 g/dL   AST 19 15 - 41 U/L   ALT 12 0 - 44 U/L   Alkaline Phosphatase  66 38 - 126 U/L   Total Bilirubin 0.7 0.3 - 1.2 mg/dL   GFR calc non Af Amer 19 (L) >60 mL/min   GFR calc Af Amer 22 (L) >60 mL/min   Anion gap 12 5 - 15    Comment: Performed at [facility], [address] [place], [place] [address]  CBC     Status: Abnormal   Collection Time: 09/22/18 11:34 AM  Result Value Ref Range   WBC 7.8 4.0 - 10.5 K/uL   RBC 3.70 (L) 3.87 - 5.11 MIL/uL   Hemoglobin 8.6 (L) 12.0 - 15.0 g/dL    Comment: Reticulocyte Hemoglobin testing may be clinically indicated, consider ordering this additional test [id number]    HCT 28.7 (L) 36.0 - 46.0 %   MCV 77.6 (L) 80.0 - 100.0 fL   MCH 23.2 (L) 26.0 - 34.0 pg   MCHC 30.0 30.0 - 36.0 g/dL   RDW 16.2 (H) 11.5 - 15.5 %   Platelets 195 150 - 400 K/uL   nRBC 0.0 0.0 - 0.2 %    Comment: Performed at [facility], [address] [place], [place] [address]  Urinalysis, Complete w Microscopic     Status: Abnormal   Collection Time: 09/22/18  1:15 PM  Result Value Ref Range   Color, Urine YELLOW (A) YELLOW   APPearance TURBID (A) CLEAR   Specific Gravity, Urine 1.013 1.005 - 1.030   pH 5.0 5.0 - 8.0   Glucose, UA NEGATIVE NEGATIVE mg/dL   Hgb urine dipstick MODERATE (A) NEGATIVE   Bilirubin Urine NEGATIVE NEGATIVE   Ketones, ur NEGATIVE NEGATIVE mg/dL   Protein, ur 100 (A) NEGATIVE mg/dL   Nitrite NEGATIVE NEGATIVE   Leukocytes,Ua LARGE (A) NEGATIVE   RBC / HPF >50 (H) 0 - 5 RBC/hpf   WBC, UA >50 (H) 0 - 5 WBC/hpf   Bacteria, UA NONE SEEN NONE SEEN   Squamous Epithelial / LPF NONE SEEN 0 - 5   WBC Clumps PRESENT    Non Squamous Epithelial PRESENT (A) NONE SEEN    Comment: Performed at [facility], [address] [name], [place] [address]  Lactic acid, plasma     Status: None   Collection Time: 09/22/18  1:15 PM  Result Value Ref Range   Lactic Acid, Venous 1.2 0.5 - 1.9 mmol/L    Comment: Performed at [facility], [address], [place], [place] [address]   Dg Chest Portable 1 View  Result Date: 09/22/2018 CLINICAL DATA:  Weakness and confusion. EXAM: PORTABLE CHEST 1 VIEW COMPARISON:  08/01/2018 and prior radiographs FINDINGS: Cardiomegaly and pulmonary vascular congestion again noted. There is no evidence of focal airspace disease, pulmonary edema, suspicious pulmonary nodule/mass, pleural effusion, or pneumothorax. No acute bony abnormalities are identified. IMPRESSION: Cardiomegaly with pulmonary vascular congestion. Electronically Signed   By: Margarette Canada M.D.   On: 09/22/2018 13:34    Pending Labs Unresulted Labs (From admission, onward)    Start     Ordered   09/22/18 1349  Urine culture  Add-on,   AD    Question:  Patient immune status  Answer:  Normal   09/22/18 1348   09/22/18 1345  SARS CORONAVIRUS 2 Nasal Swab Aptima Multi Swab  (Asymptomatic/Tier 2 Patients  Labs)  Once,   STAT    Question Answer Comment  Is this test for diagnosis or screening Screening   Symptomatic for COVID-19 as defined by CDC No   Hospitalized for COVID-19  No   Admitted to ICU for COVID-19 No   Previously tested for COVID-19 Yes   Resident in a congregate (group) care setting Yes   Employed in healthcare setting No   Pregnant No      09/22/18 1344   Signed and Held  CBC  (enoxaparin (LOVENOX)    CrCl < 30 ml/min)  Once,   R    Comments: Baseline for enoxaparin therapy IF NOT ALREADY DRAWN.  Notify MD if PLT < 100 K.    Signed and Held   Signed and Held  Creatinine, serum  (enoxaparin (LOVENOX)    CrCl < 30 ml/min)  Once,   R    Comments: Baseline for enoxaparin therapy IF NOT ALREADY DRAWN.    Signed and Held   Signed and Held  Creatinine, serum  (enoxaparin (LOVENOX)    CrCl < 30 ml/min)  Weekly,   R    Comments: while on enoxaparin therapy.    Signed and Held   Signed and Held  Urine culture  Once,   R     Signed and Held          Vitals/Pain Today's Vitals   09/22/18 1600 09/22/18 1630 09/22/18 1700 09/22/18 1730  BP: (!) 149/104 (!) 150/88 (!) 143/87 (!) 156/83  Pulse: 87 85 84 88  Resp: 20 (!) 21 17 15   Temp:      TempSrc:      SpO2: 96% 98% 100% 100%  Weight:      Height:      PainSc:        Isolation Precautions No active isolations  Medications Medications  cefTRIAXone (ROCEPHIN) 1 g in sodium chloride 0.9 % 100 mL IVPB (has no administration in time range)  sodium chloride 0.9 % bolus 1,000 mL (0 mLs Intravenous Stopped 09/22/18 1629)  cefTRIAXone (ROCEPHIN) 2 g in sodium chloride 0.9 % 100 mL IVPB (0 g Intravenous Stopped 09/22/18 1629)    Mobility non-ambulatory High fall risk   Focused Assessments Cardiac Assessment Handoff:  Cardiac Rhythm: Normal sinus rhythm Lab Results  Component Value Date   CKTOTAL 174 08/01/2018   TROPONINI 0.03 (HH) 08/01/2018   No results found for: DDIMER Does the Patient currently have chest  pain? No     R Recommendations: See Admitting Provider Note  Report given to:   Additional Notes:

## 2018-09-22 NOTE — ED Triage Notes (Signed)
Pt arrives via EMS form home.  Caregiver stated pt had a sleeping pill several days ago that did not work at that time, but made pt groggy for the next couple of days.  Caregiver c/o to EMS was that pt was dehydrated and needed to be brought to ED.  Caregiver also c/o increasing confusion.  Pt has hx of dementia.  Pt voices no c/o at this time, alert and oriented to person/place.  Answers questions appropriately.

## 2018-09-22 NOTE — ED Provider Notes (Signed)
[facility] Emergency Department Provider Note  ____________________________________________   First MD Initiated Contact with Patient 09/22/18 1203     (approximate)  I have reviewed the triage vital signs and the nursing notes.   HISTORY  Chief Complaint Altered Mental Status    HPI [name] is a 68 y.o. female  With PMHx HTN, dementia, CKD here with reported confusion. Pt was just seen and diagnowsed with UTI. She is here today because she's been progressively less responsive and confused. On my assessment, she does not recall why she is here. She denies any pain currently. No known recent med changes.   Level 5 caveat invoked as remainder of history, ROS, and physical exam limited due to patient's AMS.        Past Medical History:  Diagnosis Date  . Anemia   . Cancer of kidney ([place])   . Dementia ([place])   . GERD (gastroesophageal reflux disease)   . Hypertension   . Renal disorder   . Uterine cancer [facility])     Patient Active Problem List   Diagnosis Date Noted  . Renal failure (ARF), acute on chronic ([place]) 08/12/2018  . Confusion 03/23/2018  . Hypokalemia 06/01/2017  . Sepsis ([place]) 06/29/2016  . UTI (urinary tract infection) 06/29/2016  . GERD (gastroesophageal reflux disease) 06/29/2016  . Dementia ([place]) 06/29/2016  . 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 Essential hypertension 07/06/2014  . H/O kidney transplant 07/06/2014    Past Surgical History:  Procedure Laterality Date  . ABDOMINAL HYSTERECTOMY    . AV FISTULA PLACEMENT    . BREAST BIOPSY     Patient is unsure but she thinks she remember a Bx  . BREAST BIOPSY Right 2017   benign  . KIDNEY TRANSPLANT    . NEPHRECTOMY TRANSPLANTED ORGAN      Prior to Admission medications   Medication Sig Start Date End Date Taking? Authorizing Provider  atorvastatin (LIPITOR) 40 MG tablet Take 40 mg by mouth daily.     [provider]  calcitRIOL (ROCALTROL) 0.25  MCG capsule Take 0.25 mcg by mouth daily.    [provider]  calcium elemental as carbonate (TUMS ULTRA 1000) 400 MG chewable tablet Chew 1,000 mg by mouth daily as needed for heartburn.    [provider]  cholecalciferol (VITAMIN D3) 25 MCG (1000 UT) tablet Take 1,000 Units by mouth daily.    [provider]  citalopram (CELEXA) 20 MG tablet Take 20 mg by mouth daily.    [provider]  donepezil (ARICEPT) 5 MG tablet Take 5 mg by mouth at bedtime.     [provider]  folic acid (FOLVITE) 1 MG tablet Take 1 mg by mouth 2 (two) times daily.     [provider]  furosemide (LASIX) 20 MG tablet Take 1 tablet (20 mg total) by mouth daily. 08/17/18   Gladstone Lighter, MD  Melatonin 10 MG TABS Take 20 mg by mouth at bedtime.    [provider]  meloxicam (MOBIC) 15 MG tablet Take 15 mg by mouth daily. 06/14/18   [provider]  metoprolol succinate (TOPROL-XL) 25 MG 24 hr tablet Take 1 tablet (25 mg total) by mouth daily. 07/02/16   Gladstone Lighter, MD  mycophenolate (MYFORTIC) 180 MG EC tablet Take 180 mg by mouth 2 (two) times daily.    [provider]  potassium chloride (K-DUR) 10 MEQ tablet Take 2 tablets (20 mEq total) by mouth daily. ONLY WHILE  TAKING LASIX 08/17/18   Gladstone Lighter, MD  sirolimus (RAPAMUNE) 1 MG tablet Take 3 mg by mouth daily.     [provider]    Allergies Ace inhibitors and Lisinopril  Family History  Problem Relation Age of Onset  . Stroke Mother   . Alcohol abuse Father   . Breast cancer Neg Hx     Social History Social History   Tobacco Use  . Smoking status: Former Research scientist (life sciences)  . Smokeless tobacco: Never Used  Substance Use Topics  . Alcohol use: No  . Drug use: No    Review of Systems  Review of Systems  Unable to perform ROS: Patient unresponsive  Constitutional: Positive for fatigue.  Psychiatric/Behavioral: Positive for confusion.  All other systems  reviewed and are negative.    ____________________________________________  PHYSICAL EXAM:      VITAL SIGNS: ED Triage Vitals  Enc Vitals Group     BP 09/22/18 1127 132/88     Pulse Rate 09/22/18 1127 89     Resp 09/22/18 1127 18     Temp 09/22/18 1127 98.3 F (36.8 C)     Temp src --      SpO2 09/22/18 1127 98 %     Weight 09/22/18 1133 204 lb (92.5 kg)     Height 09/22/18 1133 5' (1.524 m)     Head Circumference --      Peak Flow --      Pain Score 09/22/18 1127 0     Pain Loc --      Pain Edu? --      Excl. in [place]? --      Physical Exam Vitals signs and nursing note reviewed.  Constitutional:      General: She is not in acute distress.    Appearance: She is well-developed.  HENT:     Head: Normocephalic and atraumatic.     Mouth/Throat:     Mouth: Mucous membranes are dry.  Eyes:     Conjunctiva/sclera: Conjunctivae normal.  Neck:     Musculoskeletal: Neck supple.  Cardiovascular:     Rate and Rhythm: Normal rate and regular rhythm.     Heart sounds: Normal heart sounds. No murmur. No friction rub.  Pulmonary:     Effort: Pulmonary effort is normal. No respiratory distress.     Breath sounds: Normal breath sounds. No wheezing or rales.  Abdominal:     General: There is no distension.     Palpations: Abdomen is soft.     Tenderness: There is abdominal tenderness (mild, SP).  Skin:    General: Skin is warm.     Capillary Refill: Capillary refill takes less than 2 seconds.     Findings: No rash.  Neurological:     Mental Status: She is alert and oriented to person, place, and time.     Motor: No abnormal muscle tone.       ____________________________________________   LABS (all labs ordered are listed, but only abnormal results are displayed)  Labs Reviewed  COMPREHENSIVE METABOLIC PANEL - Abnormal; Notable for the following components:      Result Value   Glucose, Bld 124 (*)    BUN 30 (*)    Creatinine, Ser 2.50 (*)    Calcium 8.2 (*)     Albumin 3.3 (*)    GFR calc non Af Amer 19 (*)    GFR calc Af Amer 22 (*)    All other components within normal limits  CBC -  Abnormal; Notable for the following components:   RBC 3.70 (*)    Hemoglobin 8.6 (*)    HCT 28.7 (*)    MCV 77.6 (*)    MCH 23.2 (*)    RDW 16.2 (*)    All other components within normal limits  URINALYSIS, COMPLETE (UACMP) WITH MICROSCOPIC - Abnormal; Notable for the following components:   Color, Urine YELLOW (*)    APPearance TURBID (*)    Hgb urine dipstick MODERATE (*)    Protein, ur 100 (*)    Leukocytes,Ua LARGE (*)    RBC / HPF >50 (*)    WBC, UA >50 (*)    Non Squamous Epithelial PRESENT (*)    All other components within normal limits  SARS CORONAVIRUS 2  URINE CULTURE  LACTIC ACID, PLASMA    ____________________________________________  EKG: NSR, VR 87. Baseline artifact. Normal intervals and axis. No acute ST-T segment changes. ________________________________________  RADIOLOGY All imaging, including plain films, CT scans, and ultrasounds, independently reviewed by me, and interpretations confirmed via formal radiology reads.  ED MD interpretation:   CXR: Mild pulm congestion, no focal PNA, no PTX  Official radiology report(s): Dg Chest Portable 1 View  Result Date: 09/22/2018 CLINICAL DATA:  Weakness and confusion. EXAM: PORTABLE CHEST 1 VIEW COMPARISON:  08/01/2018 and prior radiographs FINDINGS: Cardiomegaly and pulmonary vascular congestion again noted. There is no evidence of focal airspace disease, pulmonary edema, suspicious pulmonary nodule/mass, pleural effusion, or pneumothorax. No acute bony abnormalities are identified. IMPRESSION: Cardiomegaly with pulmonary vascular congestion. Electronically Signed   By: Margarette Canada M.D.   On: 09/22/2018 13:34    ____________________________________________  PROCEDURES   Procedure(s) performed (including Critical Care):  Procedures  ____________________________________________   INITIAL IMPRESSION / MDM / [place] / ED COURSE  As part of my medical decision making, I reviewed the following data within the electronic MEDICAL RECORD NUMBER Notes from prior ED visits and [place] Controlled Substance Database      *[name] was evaluated in Emergency Department on 09/22/2018 for the symptoms described in the history of present illness. She was evaluated in the context of the global COVID-19 pandemic, which necessitated consideration that the patient might be at risk for infection with the SARS-CoV-2 virus that causes COVID-19. Institutional protocols and algorithms that pertain to the evaluation of patients at risk for COVID-19 are in a state of rapid change based on information released by regulatory bodies including the CDC and federal and state organizations. These policies and algorithms were followed during the patient's care in the ED.  Some ED evaluations and interventions may be delayed as a result of limited staffing during the pandemic.*   Clinical Course as of Sep 21 1409  Sat Sep 22, 2018  1316 68 yo F here with generalized weakness and poor PO intake. CMP does show mild dehydration with elevation of BUN/Cr above baseline. UA pending. LA pending. Temp 100.2 rectally - suspect possible AMS 2/2 acute infection. H/o UTIs.    [CI]  1346 Labs show +UTI, acute on chronic AKI 2/2 dehydration. LA normal. CXR clear. Admit for AKI, AMS 2/2 UTI.    [CI]    Clinical Course User Index [CI] Duffy Bruce, MD    Medical Decision Making: As above. Admit for AMS, dehydration secondary to UTI.  ____________________________________________  FINAL CLINICAL IMPRESSION(S) / ED DIAGNOSES  Final diagnoses:  Disorientation  Acute cystitis with hematuria  Dehydration     MEDICATIONS GIVEN DURING THIS VISIT:  Medications  cefTRIAXone (ROCEPHIN) 2 g in sodium chloride 0.9 % 100 mL IVPB (2 g Intravenous New Bag/Given 09/22/18 1405)  sodium chloride 0.9 % bolus 1,000  mL (1,000 mLs Intravenous New Bag/Given 09/22/18 1314)     ED Discharge Orders    None       Note:  This document was prepared using Dragon voice recognition software and may include unintentional dictation errors.   Duffy Bruce, MD 09/22/18 [phone]

## 2018-09-23 DIAGNOSIS — L8990 Pressure ulcer of unspecified site, unspecified stage: Secondary | ICD-10-CM | POA: Insufficient documentation

## 2018-09-23 LAB — CBC
HCT: 27 % — ABNORMAL LOW (ref 36.0–46.0)
Hemoglobin: 8.1 g/dL — ABNORMAL LOW (ref 12.0–15.0)
MCH: 23.8 pg — ABNORMAL LOW (ref 26.0–34.0)
MCHC: 30 g/dL (ref 30.0–36.0)
MCV: 79.2 fL — ABNORMAL LOW (ref 80.0–100.0)
Platelets: 165 10*3/uL (ref 150–400)
RBC: 3.41 MIL/uL — ABNORMAL LOW (ref 3.87–5.11)
RDW: 16.2 % — ABNORMAL HIGH (ref 11.5–15.5)
WBC: 6.7 10*3/uL (ref 4.0–10.5)
nRBC: 0 % (ref 0.0–0.2)

## 2018-09-23 LAB — CREATININE, SERUM
Creatinine, Ser: 2 mg/dL — ABNORMAL HIGH (ref 0.44–1.00)
GFR calc Af Amer: 29 mL/min — ABNORMAL LOW (ref >60)
GFR calc non Af Amer: 25 mL/min — ABNORMAL LOW (ref >60)

## 2018-09-23 LAB — C DIFFICILE QUICK SCREEN W PCR REFLEX
C Diff antigen: POSITIVE — AB
C Diff toxin: NEGATIVE

## 2018-09-23 LAB — URINE CULTURE
Culture: NO GROWTH
Special Requests: NORMAL

## 2018-09-23 LAB — SARS CORONAVIRUS 2 (TAT 6-24 HRS): SARS Coronavirus 2: NEGATIVE

## 2018-09-23 MED ORDER — HEPARIN SODIUM (PORCINE) 5000 UNIT/ML IJ SOLN
5000.0000 [IU] | Freq: Three times a day (TID) | INTRAMUSCULAR | Status: DC
  Administered 2018-09-23 – 2018-09-25 (×6): 5000 [IU] via SUBCUTANEOUS
  Filled 2018-09-23 (×7): qty 1

## 2018-09-23 MED ORDER — VANCOMYCIN 50 MG/ML ORAL SOLUTION: 125.0000 mg | Freq: Four times a day (QID) | ORAL | Status: DC

## 2018-09-23 NOTE — Progress Notes (Signed)
[place] NAME: [name]    MR#:  [id number]  DATE OF BIRTH:  January 21, 1951  SUBJECTIVE:  CHIEF COMPLAINT:   Chief Complaint  Patient presents with  . Altered Mental Status  Patient seen and evaluated today Has generalized weakness No fever Awake and responds to verbal commands Has history of dementia  REVIEW OF SYSTEMS:    ROS  CONSTITUTIONAL: No documented fever. Has fatigue, weakness. No weight gain, no weight loss.  EYES: No blurry or double vision.  ENT: No tinnitus. No postnasal drip. No redness of the oropharynx.  RESPIRATORY: No cough, no wheeze, no hemoptysis. No dyspnea.  CARDIOVASCULAR: No chest pain. No orthopnea. No palpitations. No syncope.  GASTROINTESTINAL: No nausea, no vomiting or diarrhea. No abdominal pain. No melena or hematochezia.  GENITOURINARY: No dysuria or hematuria.  ENDOCRINE: No polyuria or nocturia. No heat or cold intolerance.  HEMATOLOGY: No anemia. No bruising. No bleeding.  INTEGUMENTARY: No rashes. No lesions.  MUSCULOSKELETAL: No arthritis. No swelling. No gout.  NEUROLOGIC: No numbness, tingling, or ataxia. No seizure-type activity.  PSYCHIATRIC: No anxiety. No insomnia. No ADD.   DRUG ALLERGIES:   Allergies  Allergen Reactions  . Ace Inhibitors Cough  . Lisinopril Cough    VITALS:  Blood pressure 139/73, pulse 89, temperature 98.2 F (36.8 C), resp. rate 20, height 5' (1.524 m), weight 92.5 kg, SpO2 100 %.  PHYSICAL EXAMINATION:   Physical Exam  GENERAL:  68 y.o.-year-old patient lying in the bed with no acute distress.  EYES: Pupils equal, round, reactive to light and accommodation. No scleral icterus. Extraocular muscles intact.  HEENT: Head atraumatic, normocephalic. Oropharynx dry and nasopharynx clear.  NECK:  Supple, no jugular venous distention. No thyroid enlargement, no tenderness.  LUNGS: Normal breath sounds bilaterally, no wheezing, rales, rhonchi. No use  of accessory muscles of respiration.  CARDIOVASCULAR: S1, S2 normal. No murmurs, rubs, or gallops.  ABDOMEN: Soft, nontender, nondistended. Bowel sounds present. No organomegaly or mass.  EXTREMITIES: No cyanosis, clubbing or edema b/l.    NEUROLOGIC: Cranial nerves II through XII are intact. No focal Motor or sensory deficits b/l.   PSYCHIATRIC: The patient is alert and oriented x 2.  SKIN: No obvious rash, lesion, or ulcer.   LABORATORY PANEL:   CBC Recent Labs  Lab 09/22/18 1134  WBC 7.8  HGB 8.6*  HCT 28.7*  PLT 195   ------------------------------------------------------------------------------------------------------------------ Chemistries  Recent Labs  Lab 09/22/18 1134  NA 140  K 4.4  CL 105  CO2 23  GLUCOSE 124*  BUN 30*  CREATININE 2.50*  CALCIUM 8.2*  AST 19  ALT 12  ALKPHOS 66  BILITOT 0.7   ------------------------------------------------------------------------------------------------------------------  Cardiac Enzymes No results for input(s): TROPONINI in the last 168 hours. ------------------------------------------------------------------------------------------------------------------  RADIOLOGY:  Dg Chest Portable 1 View  Result Date: 09/22/2018 CLINICAL DATA:  Weakness and confusion. EXAM: PORTABLE CHEST 1 VIEW COMPARISON:  08/01/2018 and prior radiographs FINDINGS: Cardiomegaly and pulmonary vascular congestion again noted. There is no evidence of focal airspace disease, pulmonary edema, suspicious pulmonary nodule/mass, pleural effusion, or pneumothorax. No acute bony abnormalities are identified. IMPRESSION: Cardiomegaly with pulmonary vascular congestion. Electronically Signed   By: Margarette Canada M.D.   On: 09/22/2018 13:34     ASSESSMENT AND PLAN:   68 year old female patient with history of dementia, GERD, hypertension, chronic kidney disease with history of renal transplant and recently diagnosed UTI currently under hospitalist  service  -Acute encephalopathy secondary to  recurrent UTI Improving slowly Continue IV antibiotics Currently on IV ceftriaxone Follow-up urine cultures  -Recurrent UTI Follow-up urine culture Continue IV Rocephin antibiotic  -Ambulatory dysfunction Physical therapy evaluation  -Hypertension Continue Lopressor  -Hyperlipidemia Continue Lipitor   -Acute on chronic kidney disease with history of renal transplant Follow-up creatinine Avoid nephrotoxic meds  -DVT prophylaxis Change to subcu heparin  All the records are reviewed and case discussed with Care Management/Social Worker. Management plans discussed with the patient, family and they are in agreement.  CODE STATUS: Full code  DVT Prophylaxis: SCDs  TOTAL TIME TAKING CARE OF THIS PATIENT: 35 minutes.   POSSIBLE D/C IN 2 to 3 DAYS, DEPENDING ON CLINICAL CONDITION.  Saundra Shelling M.D on 09/23/2018 at 10:49 AM  Between 7am to 6pm - Pager - [phone]  After 6pm go to [email or web address] - password [email or web address] [place] [facility]  Office  [phone]  CC: Primary care physician; [place]  Note: This dictation was prepared with Dragon dictation along with smaller phrase technology. Any transcriptional errors that result from this process are unintentional.

## 2018-09-23 NOTE — Plan of Care (Signed)

## 2018-09-23 NOTE — Progress Notes (Signed)
PT Cancellation Note  Patient Details Name: [name] MRN: [id number] DOB: 08/05/1950   Cancelled Treatment:    Reason Eval/Treat Not Completed: Patient's level of consciousness;Other (comment)(Chart reviewed, RN consulted. Pt awake at entry, interactive, agreeable to participate. Has apraxia when starting session.) Pt given cues for mobility from supine to sitting EOB, pt attends to this task each time for <3sec prior to doing something else. Eventually author breaks down cues to simpler, segmented movements; pt reports I can't move", then after follow questioning, "there's a hole" pointing to the mattress under her buttocks. She's cued to simply abduct her leg to EOB, which she then again says she cannot move. Given her AMS at admission, will hold PT evaluation until mentation will allow for patient to fully demonstrate her potential and ability to participate.  4:06 PM, 09/23/18 Etta Grandchild, PT, DPT Physical Therapist - [facility]  [phone] ([place])     [place] 09/23/2018, 4:03 PM

## 2018-09-23 NOTE — Progress Notes (Signed)
Advanced care plan. Purpose of the Encounter: CODE STATUS Parties in Attendance: Patient Patient's Decision Capacity: Good Subjective/Patient's story: [name]  is a 68 y.o. female with a known history of dementia, GERD, hypertension, chronic kidney disease history of renal transplant who was seen in the ER on 7/25 at that time was diagnosed with a UTI was discharged on oral antibiotics.  And came back for generalized weakness. Objective/Medical story Patient needs IV antibiotics for urinary tract infection. Patient needs IV fluids for dehydration. Goals of care determination:  Goals of care treatment plan discussed For now patient wants everything done CODE STATUS: Full resuscitation Time spent discussing advanced care planning: 16 minutes

## 2018-09-23 NOTE — Progress Notes (Signed)
Patient has had more than 3 loose bowel movements in the last. Dr Posey Pronto, The on call provider, notified about need for enteric precautions and Cdiff lab ordered.

## 2018-09-23 NOTE — Progress Notes (Signed)
Patient's CDIFF PCR came back with positive antigen and negative toxin. Dr. Jannifer Franklin, the covering provider notified. [name] has her starting on PO vanc. Will continue to monitor.

## 2018-09-24 LAB — BASIC METABOLIC PANEL
Anion gap: 9 (ref 5–15)
BUN: 24 mg/dL — ABNORMAL HIGH (ref 8–23)
CO2: 22 mmol/L (ref 22–32)
Calcium: 8.3 mg/dL — ABNORMAL LOW (ref 8.9–10.3)
Chloride: 114 mmol/L — ABNORMAL HIGH (ref 98–111)
Creatinine, Ser: 1.81 mg/dL — ABNORMAL HIGH (ref 0.44–1.00)
GFR calc Af Amer: 33 mL/min — ABNORMAL LOW (ref >60)
GFR calc non Af Amer: 28 mL/min — ABNORMAL LOW (ref >60)
Glucose, Bld: 90 mg/dL (ref 70–99)
Potassium: 3.5 mmol/L (ref 3.5–5.1)
Sodium: 145 mmol/L (ref 135–145)

## 2018-09-24 LAB — CLOSTRIDIUM DIFFICILE BY PCR, REFLEXED: Toxigenic C. Difficile by PCR: POSITIVE — AB

## 2018-09-24 MED ORDER — OCUVITE-LUTEIN PO CAPS
1.0000 | ORAL_CAPSULE | Freq: Every day | ORAL | Status: DC
  Administered 2018-09-25 – 2018-09-27 (×3): 1 via ORAL
  Filled 2018-09-24 (×3): qty 1

## 2018-09-24 MED ORDER — POTASSIUM CHLORIDE CRYS ER 20 MEQ PO TBCR
20.0000 meq | EXTENDED_RELEASE_TABLET | Freq: Once | ORAL | Status: AC
  Administered 2018-09-24: 11:00:00 20 meq via ORAL
  Filled 2018-09-24: qty 1

## 2018-09-24 MED ORDER — ENSURE ENLIVE PO LIQD
237.0000 mL | Freq: Two times a day (BID) | ORAL | Status: DC
  Administered 2018-09-25 – 2018-09-27 (×6): 237 mL via ORAL

## 2018-09-24 MED ORDER — VANCOMYCIN 50 MG/ML ORAL SOLUTION
125.0000 mg | Freq: Four times a day (QID) | ORAL | Status: DC
  Administered 2018-09-24 – 2018-09-27 (×14): 125 mg via ORAL
  Filled 2018-09-24 (×18): qty 2.5

## 2018-09-24 NOTE — Evaluation (Signed)
Physical Therapy Evaluation Patient Details Name: [name] MRN: [id number] DOB: [birth year] Today's Date: 09/24/2018   History of Present Illness  [name] is a 4yoF who comes to [facility] after worseing AMS, admitted with UTI.  Clinical Impression  Pt admitted with above diagnosis. Pt currently with functional limitations due to the deficits listed below (see "PT Problem List"). Upon entry, pt in bed, awake and agreeable to participate, initially appears more lucid and cogent in interaction, but continues to demonstrates clear cognitive impairment in distractibility, apraxia, disorientation, and impulsivity. Pt not following cues very well, which becomes a safety concern once in standing. The pt is alert and oriented to self and location only, pleasant, conversational. Tactile cues needed for initiation to EOB and standing, but no clear strength deficits seen. In standing pt has trouble with using RW safely and does not follow cues to correct, demonstrating poor problem solving. Functional mobility assessment demonstrates increased effort/time requirements, poor tolerance, and need for physical assistance, whereas the patient performed these at a higher level of independence PTA. Baseline mentation remains unclear. Pt will benefit from skilled PT intervention to increase independence and safety with basic mobility in preparation for discharge to the venue listed below.       Follow Up Recommendations Supervision/Assistance - 24 hour;Home health PT;Supervision for mobility/OOB    Equipment Recommendations  None recommended by PT    Recommendations for Other Services       Precautions / Restrictions Precautions Precautions: Fall Restrictions Weight Bearing Restrictions: No      Mobility  Bed Mobility Overal bed mobility: Needs Assistance Bed Mobility: Supine to Sit;Sit to Supine     Supine to sit: Min assist Sit to supine: Min assist   General bed mobility comments:  mentation quite limiting; continues to be apraxic, distracted, impulsive, and require tactile cues for inititation.  Transfers Overall transfer level: Needs assistance Equipment used: Rolling walker (2 wheeled) Transfers: Sit to/from Stand Sit to Stand: Min assist         General transfer comment: tactile cues for inititation; able to maintain standing, but not following cues for safety and steppign while up, hence AMB attempts discontinued for safety reasons.  Ambulation/Gait                Stairs            Wheelchair Mobility    Modified Rankin (Stroke Patients Only)       Balance Overall balance assessment: History of Falls                                           Pertinent Vitals/Pain Pain Assessment: No/denies pain(intermittent mention of rectal pain.)    Home Living Family/patient expects to be discharged to:: Private residence Living Arrangements: Children;Spouse/significant other Available Help at Discharge: Available PRN/intermittently Type of Home: Apartment Home Access: Stairs to enter Entrance Stairs-Rails: Right;Left;Can reach both Entrance Stairs-Number of Steps: Patient is a poor historian, old PT notes indicate 2 steps in. Home Layout: One level Home Equipment: Walker - 2 wheels;Cane - single point Additional Comments: Pt lives with her daughter; her daughter is gone during the day at work; pt reports using [place] intermittently for ambulation; no recent falls reported though patient is a poor historian. Info taken from prior PT notes.    Prior Function Level of Independence: Independent with assistive device(s)  Hand Dominance   Dominant Hand: Right    Extremity/Trunk Assessment   Upper Extremity Assessment Upper Extremity Assessment: Generalized weakness;Overall [facility] for tasks assessed    Lower Extremity Assessment Lower Extremity Assessment: Generalized weakness;Overall WFL for tasks assessed        Communication   Communication: No difficulties  Cognition Arousal/Alertness: Awake/alert Behavior During Therapy: WFL for tasks assessed/performed Overall Cognitive Status: Impaired/Different from baseline Area of Impairment: Safety/judgement;Following commands;Problem solving;Attention;Orientation                 Orientation Level: Disoriented to;Time;Situation Current Attention Level: Alternating   Following Commands: Follows one step commands with increased time;Follows one step commands inconsistently Safety/Judgement: Decreased awareness of safety;Decreased awareness of deficits   Problem Solving: Slow processing;Decreased initiation;Difficulty sequencing;Requires verbal cues;Requires tactile cues        General Comments      Exercises     Assessment/Plan    PT Assessment Patient needs continued PT services  PT Problem List Decreased mobility;Decreased activity tolerance;Decreased strength       PT Treatment Interventions Gait training;Cognitive remediation;Functional mobility training;Therapeutic activities;Therapeutic exercise    PT Goals (Current goals can be found in the Care Plan section)  Acute Rehab PT Goals PT Goal Formulation: Patient unable to participate in goal setting Time For Goal Achievement: 10/08/18 Potential to Achieve Goals: Fair    Frequency Min 2X/week   Barriers to discharge        Co-evaluation               AM-PAC PT "6 Clicks" Mobility  Outcome Measure Help needed turning from your back to your side while in a flat bed without using bedrails?: A Lot Help needed moving from lying on your back to sitting on the side of a flat bed without using bedrails?: A Lot Help needed moving to and from a bed to a chair (including a wheelchair)?: A Lot Help needed standing up from a chair using your arms (e.g., wheelchair or bedside chair)?: A Lot Help needed to walk in hospital room?: A Lot Help needed climbing 3-5 steps with a  railing? : A Lot 6 Click Score: 12    End of Session Equipment Utilized During Treatment: Gait belt Activity Tolerance: Patient tolerated treatment well;Other (comment)(unsafe; not following cues) Patient left: in bed;with call bell/phone within reach;with SCD's reapplied;with bed alarm set Nurse Communication: Mobility status PT Visit Diagnosis: Muscle weakness (generalized) (M62.81);Difficulty in walking, not elsewhere classified (R26.2)    Time: 4818-5631 PT Time Calculation (min) (ACUTE ONLY): 19 min   Charges:   PT Evaluation $PT Eval Moderate Complexity: 1 Mod          12:40 PM, 09/24/18 Etta Grandchild, PT, DPT Physical Therapist - [facility]  [phone] ([place])    [place] 09/24/2018, 12:36 PM

## 2018-09-24 NOTE — Progress Notes (Signed)
[place] at [place] NAME: [name]    MR#:  [id number]  DATE OF BIRTH:  February 14, 1951  SUBJECTIVE:  CHIEF COMPLAINT:   Chief Complaint  Patient presents with  . Altered Mental Status  Patient seen and evaluated today Has generalized weakness No fever Has loose stools Awake and responds to verbal commands Has history of dementia  REVIEW OF SYSTEMS:    ROS  CONSTITUTIONAL: No documented fever. Has fatigue, weakness. No weight gain, no weight loss.  EYES: No blurry or double vision.  ENT: No tinnitus. No postnasal drip. No redness of the oropharynx.  RESPIRATORY: No cough, no wheeze, no hemoptysis. No dyspnea.  CARDIOVASCULAR: No chest pain. No orthopnea. No palpitations. No syncope.  GASTROINTESTINAL: No nausea, no vomiting , has diarrhea. No abdominal pain. No melena or hematochezia.  GENITOURINARY: No dysuria or hematuria.  ENDOCRINE: No polyuria or nocturia. No heat or cold intolerance.  HEMATOLOGY: No anemia. No bruising. No bleeding.  INTEGUMENTARY: No rashes. No lesions.  MUSCULOSKELETAL: No arthritis. No swelling. No gout.  NEUROLOGIC: No numbness, tingling, or ataxia. No seizure-type activity.  PSYCHIATRIC: No anxiety. No insomnia. No ADD.   DRUG ALLERGIES:   Allergies  Allergen Reactions  . Ace Inhibitors Cough  . Lisinopril Cough    VITALS:  Blood pressure (!) 152/60, pulse 79, temperature 97.6 F (36.4 C), temperature source Oral, resp. rate 18, height 5' (1.524 m), weight 92.5 kg, SpO2 99 %.  PHYSICAL EXAMINATION:   Physical Exam  GENERAL:  68 y.o.-year-old patient lying in the bed with no acute distress.  EYES: Pupils equal, round, reactive to light and accommodation. No scleral icterus. Extraocular muscles intact.  HEENT: Head atraumatic, normocephalic. Oropharynx dry and nasopharynx clear.  NECK:  Supple, no jugular venous distention. No thyroid enlargement, no tenderness.  LUNGS: Normal breath sounds  bilaterally, no wheezing, rales, rhonchi. No use of accessory muscles of respiration.  CARDIOVASCULAR: S1, S2 normal. No murmurs, rubs, or gallops.  ABDOMEN: Soft, nontender, nondistended. Bowel sounds present. No organomegaly or mass.  EXTREMITIES: No cyanosis, clubbing or edema b/l.    NEUROLOGIC: Cranial nerves II through XII are intact. No focal Motor or sensory deficits b/l.   PSYCHIATRIC: The patient is alert and oriented x 2.  SKIN: No obvious rash, lesion, or ulcer.   LABORATORY PANEL:   CBC Recent Labs  Lab 09/23/18 1246  WBC 6.7  HGB 8.1*  HCT 27.0*  PLT 165   ------------------------------------------------------------------------------------------------------------------ Chemistries  Recent Labs  Lab 09/22/18 1134  09/24/18 0526  NA 140  --  145  K 4.4  --  3.5  CL 105  --  114*  CO2 23  --  22  GLUCOSE 124*  --  90  BUN 30*  --  24*  CREATININE 2.50*   < > 1.81*  CALCIUM 8.2*  --  8.3*  AST 19  --   --   ALT 12  --   --   ALKPHOS 66  --   --   BILITOT 0.7  --   --    < > = values in this interval not displayed.   ------------------------------------------------------------------------------------------------------------------  Cardiac Enzymes No results for input(s): TROPONINI in the last 168 hours. ------------------------------------------------------------------------------------------------------------------  RADIOLOGY:  Dg Chest Portable 1 View  Result Date: 09/22/2018 CLINICAL DATA:  Weakness and confusion. EXAM: PORTABLE CHEST 1 VIEW COMPARISON:  08/01/2018 and prior radiographs FINDINGS: Cardiomegaly and pulmonary vascular congestion again noted. There is no evidence  of focal airspace disease, pulmonary edema, suspicious pulmonary nodule/mass, pleural effusion, or pneumothorax. No acute bony abnormalities are identified. IMPRESSION: Cardiomegaly with pulmonary vascular congestion. Electronically Signed   By: Margarette Canada M.D.   On: 09/22/2018 13:34      ASSESSMENT AND PLAN:   68 year old female patient with history of dementia, GERD, hypertension, chronic kidney disease with history of renal transplant and recently diagnosed UTI currently under hospitalist service  -Acute C. difficile colitis Continue oral vancomycin IV fluids Enteric precautions Electrolyte management  -Acute encephalopathy secondary to recurrent UTI Improving  Currently on IV ceftriaxone Follow-up urine cultures  -Recurrent UTI Follow-up urine culture Continue IV Rocephin antibiotic  -Ambulatory dysfunction Physical therapy evaluation  -Hypertension Continue Lopressor  -Hyperlipidemia Continue Lipitor   -Acute on chronic kidney disease with history of renal transplant Improving Follow-up creatinine Avoid nephrotoxic meds  -DVT prophylaxis Change to subcu heparin  All the records are reviewed and case discussed with Care Management/Social Worker. Management plans discussed with the patient, family and they are in agreement.  CODE STATUS: Full code  DVT Prophylaxis: SCDs  TOTAL TIME TAKING CARE OF THIS PATIENT: 38 minutes.   POSSIBLE D/C IN 2 to 3 DAYS, DEPENDING ON CLINICAL CONDITION.  Saundra Shelling M.D on 09/24/2018 at 10:51 AM  Between 7am to 6pm - Pager - [phone]  After 6pm go to [email or web address] - password [email or web address] [facility] [facility]  Office  [phone]  CC: Primary care physician; [place]  Note: This dictation was prepared with Dragon dictation along with smaller phrase technology. Any transcriptional errors that result from this process are unintentional.

## 2018-09-24 NOTE — Progress Notes (Signed)
Initial Nutrition Assessment  DOCUMENTATION CODES:   Obesity unspecified  INTERVENTION:  Recommend liberalizing diet to regular.  Provide Ensure Enlive po BID, each supplement provides 350 kcal and 20 grams of protein.  Provide Ocuvite daily for wound healing (provides zinc, vitamin A, vitamin C, Vitamin E, copper, and selenium).  NUTRITION DIAGNOSIS:   Increased nutrient needs related to wound healing as evidenced by estimated needs.  GOAL:   Patient will meet greater than or equal to 90% of their needs  MONITOR:   PO intake, Supplement acceptance, Labs, Weight trends, Skin, I & O's  REASON FOR ASSESSMENT:   Malnutrition Screening Tool    ASSESSMENT:   68 year old female with PMHx of HTN, GERD, anemia, dementia, uterine cancer, kidney cancer, hx renal transplant admitted with C difficile colitis, acute encephalopathy, recurrent UTI.   Patient currently on enteric precautions. Attempted to call patient over the phone but she was unable to answer. No meal completion documented at this time so unsure how she is eating here. Weight seems to fluctuate significantly in chart so unable to determine a clear weight trend. She is currently 92.5 kg, though this weight appears to be copied forward from the last encounter on 09/08/2018. She was listed as 112 kg on 5/6, 6/17, and 6/28 but then prior to that was listed as 73.5 kg on 3/27.  Medications reviewed and include: vitamin D3 3785 units daily, folic acid 1 mg BID, melatonin 20 mg QHS, vancomycin, NS at 100 mL/hr.  Labs reviewed: Chloride 114, BUN 24, Creatinine 1.81.  NUTRITION - FOCUSED PHYSICAL EXAM:  Unable to complete at this time.  Diet Order:   Diet Order            Diet heart healthy/carb modified Room service appropriate? Yes; Fluid consistency: Thin  Diet effective now             EDUCATION NEEDS:   No education needs have been identified at this time  Skin:  Skin Assessment: Skin Integrity Issues:(stg II  right buttocks (2.5cm x 1.5cm))  Last BM:  09/24/2018 - small type 6  Height:   Ht Readings from Last 1 Encounters:  09/22/18 5' (1.524 m)   Weight:   Wt Readings from Last 1 Encounters:  09/22/18 92.5 kg   Ideal Body Weight:  45.5 kg  BMI:  Body mass index is 39.84 kg/m.  Estimated Nutritional Needs:   Kcal:  1700-1900  Protein:  90-100 grams  Fluid:  1.7-1.9 L/day  Willey Blade, MS, RD, LDN Office: [phone] Pager: [phone] After Hours/Weekend Pager: [phone]

## 2018-09-25 LAB — URINE CULTURE: Culture: 80000 — AB

## 2018-09-25 LAB — BASIC METABOLIC PANEL
Anion gap: 8 (ref 5–15)
BUN: 22 mg/dL (ref 8–23)
CO2: 20 mmol/L — ABNORMAL LOW (ref 22–32)
Calcium: 8.2 mg/dL — ABNORMAL LOW (ref 8.9–10.3)
Chloride: 116 mmol/L — ABNORMAL HIGH (ref 98–111)
Creatinine, Ser: 1.58 mg/dL — ABNORMAL HIGH (ref 0.44–1.00)
GFR calc Af Amer: 39 mL/min — ABNORMAL LOW (ref >60)
GFR calc non Af Amer: 34 mL/min — ABNORMAL LOW (ref >60)
Glucose, Bld: 94 mg/dL (ref 70–99)
Potassium: 3.7 mmol/L (ref 3.5–5.1)
Sodium: 144 mmol/L (ref 135–145)

## 2018-09-25 MED ORDER — NITROFURANTOIN MACROCRYSTAL 50 MG PO CAPS
50.0000 mg | ORAL_CAPSULE | Freq: Two times a day (BID) | ORAL | Status: DC
  Administered 2018-09-25 – 2018-09-27 (×5): 50 mg via ORAL
  Filled 2018-09-25 (×6): qty 1

## 2018-09-25 NOTE — Progress Notes (Signed)
[place] at [place] NAME: [name]    MR#:  [id number]  DATE OF BIRTH:  1950/07/21  SUBJECTIVE:  CHIEF COMPLAINT:   Chief Complaint  Patient presents with  . Altered Mental Status  Patient seen and evaluated today Has generalized weakness No fever Has decreased loose stools Awake and responds to verbal commands Has history of dementia  REVIEW OF SYSTEMS:    ROS  CONSTITUTIONAL: No documented fever. Has fatigue, weakness. No weight gain, no weight loss.  EYES: No blurry or double vision.  ENT: No tinnitus. No postnasal drip. No redness of the oropharynx.  RESPIRATORY: No cough, no wheeze, no hemoptysis. No dyspnea.  CARDIOVASCULAR: No chest pain. No orthopnea. No palpitations. No syncope.  GASTROINTESTINAL: No nausea, no vomiting , has decreased diarrhea. No abdominal pain. No melena or hematochezia.  GENITOURINARY: No dysuria or hematuria.  ENDOCRINE: No polyuria or nocturia. No heat or cold intolerance.  HEMATOLOGY: No anemia. No bruising. No bleeding.  INTEGUMENTARY: No rashes. No lesions.  MUSCULOSKELETAL: No arthritis. No swelling. No gout.  NEUROLOGIC: No numbness, tingling, or ataxia. No seizure-type activity.  PSYCHIATRIC: No anxiety. No insomnia. No ADD.   DRUG ALLERGIES:   Allergies  Allergen Reactions  . Ace Inhibitors Cough  . Lisinopril Cough    VITALS:  Blood pressure (!) 126/55, pulse 72, temperature 97.9 F (36.6 C), temperature source Oral, resp. rate 19, height 5' (1.524 m), weight 92.5 kg, SpO2 100 %.  PHYSICAL EXAMINATION:   Physical Exam  GENERAL:  68 y.o.-year-old patient lying in the bed with no acute distress.  EYES: Pupils equal, round, reactive to light and accommodation. No scleral icterus. Extraocular muscles intact.  HEENT: Head atraumatic, normocephalic. Oropharynx dry and nasopharynx clear.  NECK:  Supple, no jugular venous distention. No thyroid enlargement, no tenderness.  LUNGS:  Normal breath sounds bilaterally, no wheezing, rales, rhonchi. No use of accessory muscles of respiration.  CARDIOVASCULAR: S1, S2 normal. No murmurs, rubs, or gallops.  ABDOMEN: Soft, nontender, nondistended. Bowel sounds present. No organomegaly or mass.  EXTREMITIES: No cyanosis, clubbing or edema b/l.    NEUROLOGIC: Cranial nerves II through XII are intact. No focal Motor or sensory deficits b/l.   PSYCHIATRIC: The patient is alert and oriented x 2.  SKIN: No obvious rash, lesion, or ulcer.   LABORATORY PANEL:   CBC Recent Labs  Lab 09/23/18 1246  WBC 6.7  HGB 8.1*  HCT 27.0*  PLT 165   ------------------------------------------------------------------------------------------------------------------ Chemistries  Recent Labs  Lab 09/22/18 1134  09/25/18 0615  NA 140   < > 144  K 4.4   < > 3.7  CL 105   < > 116*  CO2 23   < > 20*  GLUCOSE 124*   < > 94  BUN 30*   < > 22  CREATININE 2.50*   < > 1.58*  CALCIUM 8.2*   < > 8.2*  AST 19  --   --   ALT 12  --   --   ALKPHOS 66  --   --   BILITOT 0.7  --   --    < > = values in this interval not displayed.   ------------------------------------------------------------------------------------------------------------------  Cardiac Enzymes No results for input(s): TROPONINI in the last 168 hours. ------------------------------------------------------------------------------------------------------------------  RADIOLOGY:  No results found.   ASSESSMENT AND PLAN:   69 year old female patient with history of dementia, GERD, hypertension, chronic kidney disease with history of renal transplant and recently  diagnosed UTI currently under hospitalist service  -Acute C. difficile colitis Continue oral vancomycin Encourage oral intake and stop IV fluids Enteric precautions Electrolyte management  -Acute encephalopathy  Appears metabolic Improved Discontinued IV ceftriaxone  -Recurrent UTI  VRE in the urine Start  Macrodantin  -Ambulatory dysfunction Physical therapy evaluation appreciated Home health with home physical therapy on disposition  -Hypertension Continue Lopressor  -Hyperlipidemia Continue Lipitor   -Acute on chronic kidney disease with history of renal transplant Improved with IV fluids Follow-up creatinine Avoid nephrotoxic meds  -DVT prophylaxis Change to subcu heparin  All the records are reviewed and case discussed with Care Management/Social Worker. Management plans discussed with the patient, family and they are in agreement.  CODE STATUS: Full code  DVT Prophylaxis: SCDs  TOTAL TIME TAKING CARE OF THIS PATIENT: 37 minutes.   POSSIBLE D/C IN 2 to 3 DAYS, DEPENDING ON CLINICAL CONDITION.  Saundra Shelling M.D on 09/25/2018 at 11:13 AM  Between 7am to 6pm - Pager - [phone]  After 6pm go to [email or web address] - password [email or web address] [place] [facility]  Office  [phone]  CC: Primary care physician; [place]  Note: This dictation was prepared with Dragon dictation along with smaller phrase technology. Any transcriptional errors that result from this process are unintentional.

## 2018-09-25 NOTE — TOC Initial Note (Signed)
Transition of Care [facility]) - Initial/Assessment Note    Patient Details  Name: [name] MRN: [id number] Date of Birth: [birth year]  [facility]) CM/SW Contact:    Shelbie Hutching, RN Phone Number: 09/25/2018, 10:36 AM  Clinical Narrative:                 Patient admitted with altered mental status, history of dementia.  Patient is sitting up in bed awake and alert, very pleasant but also very confused.  RNCM attempted to reach out to patient's daughter, left voice message.   Daughter [name] called right back.  Daughter reports that patient lives with her and they have all needed DME, including hospital bed.  Patient is open with [place] and they have hired private caregivers through [employer] by [place].  Daughter is very confident in her ability to care for her mother at home safely.  RNCM will cont to follow and is available if any discharge needs should arise.   Expected Discharge Plan: [place] Barriers to Discharge: Continued Medical Work up   Patient Goals and CMS Choice        Expected Discharge Plan and Services Expected Discharge Plan: [place]   Discharge Planning Services: CM Consult   Living arrangements for the past 2 months: Single Family Home                                      Prior Living Arrangements/Services Living arrangements for the past 2 months: Single Family Home Lives with:: Adult Children Patient language and need for interpreter reviewed:: No Do you feel safe going back to the place where you live?: Yes      Need for Family Participation in Patient Care: Yes (Comment) Care giver support system in place?: Yes (comment)(lives with daughter)   Criminal Activity/Legal Involvement Pertinent to Current Situation/Hospitalization: No - Comment as needed  Activities of Daily Living Home Assistive Devices/Equipment: Cane (specify quad or straight), Eyeglasses, Walker (specify  type) ADL Screening (condition at time of admission) Patient's cognitive ability adequate to safely complete daily activities?: No Is the patient deaf or have difficulty hearing?: No Does the patient have difficulty seeing, even when wearing glasses/contacts?: No Does the patient have difficulty concentrating, remembering, or making decisions?: Yes Patient able to express need for assistance with ADLs?: Yes Does the patient have difficulty dressing or bathing?: No Independently performs ADLs?: Yes (appropriate for developmental age)(per patient) Does the patient have difficulty walking or climbing stairs?: Yes Weakness of Legs: None Weakness of Arms/Hands: None  Permission Sought/Granted Permission sought to share information with : Family Supports Permission granted to share information with : Yes, Verbal Permission Granted        Permission granted to share info w Relationship: daughter     Emotional Assessment Appearance:: Appears stated age Attitude/Demeanor/Rapport: Engaged Affect (typically observed): Accepting Orientation: : Oriented to Self Alcohol / Substance Use: Not Applicable Psych Involvement: No (comment)  Admission diagnosis:  Dehydration [E86.0] Disorientation [R41.0] Acute cystitis with hematuria [N30.01] Acute encephalopathy [G93.40] Patient Active Problem List   Diagnosis Date Noted  . Pressure injury of skin 09/23/2018  . Acute encephalopathy 09/22/2018  . Renal failure (ARF), acute on chronic ([place]) 08/12/2018  . Confusion 03/23/2018  . Hypokalemia 06/01/2017  . Sepsis ([place]) 06/29/2016  . UTI (urinary tract infection) 06/29/2016  . GERD (gastroesophageal reflux disease)  06/29/2016  . Dementia ([place]) 06/29/2016  . 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 Essential hypertension 07/06/2014  . H/O kidney transplant 07/06/2014   PCP:  [place] [facility]:   [id number] [address], [name] [address] Phone: [phone] Fax: [place], [place] - [address] [place] [address] Phone: [phone] Fax: [phone]     Social Determinants of Health (SDOH) Interventions    Readmission Risk Interventions Readmission Risk Prevention Plan 08/15/2018  Transportation Screening Complete  PCP or Specialist Appt within 3-5 Days Complete  HRI or Home Care Consult Complete  Palliative Care Screening Not Applicable  Medication Review (RN Care Manager) Complete  Some recent data might be hidden

## 2018-09-25 NOTE — Care Management Important Message (Signed)
Important Message  Patient Details  Name: [name] MRN: [id number] Date of Birth: May 01, 1950   Medicare Important Message Given:  Other (see comment)  Verbal Consent obtained by pt. Intake 09/24/18 @ 11:00 am  [name] [name] 09/25/2018, 7:44 AM

## 2018-09-25 NOTE — Progress Notes (Signed)
Physical Therapy Treatment Patient Details Name: [name] MRN: [id number] DOB: 1950-04-04 Today's Date: 09/25/2018    History of Present Illness [name] is a 75yoF who comes to [facility] after worseing AMS, admitted with UTI.    PT Comments    Improved ability to actively participate and follow direction with therapist this date.  Able to complete in-room gait training with L HHA, min assist to guide movement and negotiate pathway.  Remains generally confused, though suspect baseline.  Continue to recommend home with HHPT, strict 24/7 supervision (due to cognitive deficits); anticipate optimal performance and comprehension in familiar, home environment.    Follow Up Recommendations  Supervision/Assistance - 24 hour;Home health PT;Supervision for mobility/OOB     Equipment Recommendations       Recommendations for Other Services       Precautions / Restrictions Precautions Precautions: Fall Restrictions Weight Bearing Restrictions: No    Mobility  Bed Mobility Overal bed mobility: Needs Assistance Bed Mobility: Supine to Sit     Supine to sit: Min assist;Min guard Sit to supine: Mod assist;Max assist   General bed mobility comments: Pt noted to have significant difficulty planning and executing motor tasks on this date including bed mobility. Mod to max assist for sup<>sit on this date primarily to support initiation and follow-through of movements.  Transfers Overall transfer level: Needs assistance Equipment used: 1 person hand held assist Transfers: Sit to/from Stand Sit to Stand: Min assist         General transfer comment: prefers to push up from seating surface with bilat UEs, heavy use of momentum for lift off; L HHA for stabilization/safety once standing  Ambulation/Gait Ambulation/Gait assistance: Min assist Gait Distance (Feet): 50 Feet Assistive device: 1 person hand held assist       General Gait Details: reciprocal stepping pattern with  fair step height/length; inconsistent foot placement; easily distracted by external environment. Relies on L UE support/assist for guidance and path negotiation   Stairs             Wheelchair Mobility    Modified Rankin (Stroke Patients Only)       Balance Overall balance assessment: Needs assistance Sitting-balance support: No upper extremity supported;Feet supported Sitting balance-Leahy Scale: Good Sitting balance - Comments: Steady reaching within BOS during ADL tasks.   Standing balance support: Bilateral upper extremity supported Standing balance-Leahy Scale: Fair Standing balance comment: Noted to quickly lose balance and lean backward. Amb deferred due to balance concerns.                            Cognition Arousal/Alertness: Awake/alert Behavior During Therapy: WFL for tasks assessed/performed Overall Cognitive Status: No family/caregiver present to determine baseline cognitive functioning Area of Impairment: Safety/judgement;Following commands;Problem solving;Attention;Orientation                 Orientation Level: Disoriented to;Time;Situation;Place     Following Commands: Follows one step commands with increased time;Follows one step commands inconsistently Safety/Judgement: Decreased awareness of safety;Decreased awareness of deficits   Problem Solving: Slow processing;Decreased initiation;Difficulty sequencing;Requires verbal cues;Requires tactile cues General Comments: oriented to self only; follows simple verbal commands 50-60% of time, often requiring hand-over-hand for task guidance      Exercises Other Exercises Other Exercises: Sit/stand x4 without assist device, min assist-prefers bilat UEs pushing on bed suface, min assist for balance    General Comments General comments (skin integrity, edema, etc.): Pt noted to have soiled sacral pad  on buttocks upon standing. Small amount of BM in bed as well. OT changed chuck pad  underneath pt and notified NT.      Pertinent Vitals/Pain Pain Assessment: No/denies pain    Home Living Family/patient expects to be discharged to:: Private residence Living Arrangements: Children;Spouse/significant other Available Help at Discharge: Available PRN/intermittently Type of Home: Apartment Home Access: Stairs to enter Entrance Stairs-Rails: Right;Left;Can reach both Home Layout: One level Home Equipment: Environmental consultant - 2 wheels;Cane - single point Additional Comments: Per chart, pt lives with her daughter; her daughter is gone during the day at work; pt reports using [place] intermittently for ambulation; no recent falls reported though patient is a poor historian. .    Prior Function Level of Independence: Independent with assistive device(s)          PT Goals (current goals can now be found in the care plan section) Acute Rehab PT Goals Patient Stated Goal: Pt unable to state PT Goal Formulation: Patient unable to participate in goal setting Time For Goal Achievement: 10/08/18 Potential to Achieve Goals: Fair Progress towards PT goals: Progressing toward goals    Frequency    Min 2X/week      PT Plan Current plan remains appropriate    Co-evaluation              AM-PAC PT "6 Clicks" Mobility   Outcome Measure  Help needed turning from your back to your side while in a flat bed without using bedrails?: A Little Help needed moving from lying on your back to sitting on the side of a flat bed without using bedrails?: A Little Help needed moving to and from a bed to a chair (including a wheelchair)?: A Little Help needed standing up from a chair using your arms (e.g., wheelchair or bedside chair)?: A Little Help needed to walk in hospital room?: A Little Help needed climbing 3-5 steps with a railing? : A Little 6 Click Score: 18    End of Session Equipment Utilized During Treatment: Gait belt Activity Tolerance: Patient tolerated treatment well Patient  left: in chair;with call bell/phone within reach;with chair alarm set Nurse Communication: Mobility status PT Visit Diagnosis: Muscle weakness (generalized) (M62.81);Difficulty in walking, not elsewhere classified (R26.2)     Time: 1155-2080 PT Time Calculation (min) (ACUTE ONLY): 18 min  Charges:  $Gait Training: 8-22 mins                     Tilden Broz H. Owens Shark, PT, DPT, NCS 09/25/18, 4:55 PM [phone]

## 2018-09-25 NOTE — Evaluation (Signed)
Occupational Therapy Evaluation Patient Details Name: [name] MRN: [id number] DOB: [birth year] Today's Date: 09/25/2018    History of Present Illness [name] is a 23yoF who comes to [facility] after worseing AMS, admitted with UTI.   Clinical Impression   Ms. Dinsmore was seen for OT evaluation this date. Upon chart review, pt noted to be having difficulty with providing history and may also have a dementia diagnosis on baseline. Information on PLOF and ADL participation taken from past therapy notes. Per chart, pt lives with her daughter in a 1 level home and was using a SPC for ambulation.  Upon arrival to pt room, pt was sleeping soundly in bed. Pt woke to verbal cues and was agreeable to OT evaluation. Pt was A&O to self only. She stated she was at home and could not provide a guess on what the date was. She was unaware of what brought her to the hospital. OT provided reassurance and orientation information for pt. She remained pleasant t/o session but at times stated she saw other people in the room who were not there. At one point stating "I'm bugging out" and recognizing she and this Pryor Curia were the only other people in the room. RN notified.   Currently pt demonstrates impairments cognition, motor planning and sequencing, and safety awareness. She was observed to have significant difficulty with mgt of bimanual tasks such as taking lids off of containers, applying deodorant, and applying toothpaste to her toothbrush. Pt very limited in her ability to follow verbal commands and required multimodal cueing t/o session for motor tasks. She required anywhere from minimal to total assist for seated grooming ADL tasks due to poor sequencing and initiation of UE movements. Pt unable to attempt functional mobility. Noted LOB with attempt to stand at EOB. Able to take 3 small steps sidewise to adjust position in bed with max assisted for OT for direction and increased effort. Pt would benefit from  skilled OT to address noted impairments and functional limitations (see below for any additional details) in order to maximize safety and independence while minimizing falls risk and caregiver burden.  Upon hospital discharge, recommend pt discharge to STR to maximize pt safety and return to PLOF.     Follow Up Recommendations  SNF;Supervision/Assistance - 24 hour    Equipment Recommendations  Other (comment)(TBD)    Recommendations for Other Services       Precautions / Restrictions Restrictions Weight Bearing Restrictions: No      Mobility Bed Mobility Overal bed mobility: Needs Assistance Bed Mobility: Supine to Sit;Sit to Supine     Supine to sit: Mod assist Sit to supine: Mod assist;Max assist   General bed mobility comments: Pt noted to have significant difficulty planning and executing motor tasks on this date including bed mobility. Mod to max assist for sup<>sit on this date primarily to support initiation and follow-through of movements.  Transfers Overall transfer level: Needs assistance Equipment used: Rolling walker (2 wheeled) Transfers: Sit to/from Stand Sit to Stand: Min assist         General transfer comment: Continues to require tactile cues for inititation of STS able to maintain standing with BUE support, but not following cues for safety. Able to side step at EOB with significant difficulty.    Balance Overall balance assessment: History of Falls;Needs assistance Sitting-balance support: No upper extremity supported;Feet supported Sitting balance-Leahy Scale: Good Sitting balance - Comments: Steady reaching within BOS during ADL tasks.   Standing balance support: Bilateral upper extremity  supported;During functional activity Standing balance-Leahy Scale: Poor Standing balance comment: Noted to quickly lose balance and lean backward. Amb deferred due to balance concerns.                           ADL either performed or assessed with  clinical judgement   ADL Overall ADL's : Needs assistance/impaired                                       General ADL Comments: Pt noted to have significant difficulty planning and sequencing ADL tasks. Required set-up to max assist for grooming tasks at EOB. When given toothbrush and toothpaste, pt was observed to begin to brush the toothpaste cap with her toothbrush, with prompting and multimodal cueing, pt was able to apply toothpaste with mod assist. Pt then able to brush teeth independently. When given a comb, pt able to bring comb to her head appropriately, and brush the sides of her head. but was unable to reach the back of her head. Pt was observed to have most difficulty with bimanual hand skills on this date. When using only one hand (e.g. to brush teeth or comb hair) she required little assist, but when attempting bimanual tasks such as applying toothpaste or deodorant she was unable to coordinate/sequence these motor patterns without max assist.     Vision         Perception     Praxis      Pertinent Vitals/Pain Pain Assessment: No/denies pain     Hand Dominance Right   Extremity/Trunk Assessment Upper Extremity Assessment Upper Extremity Assessment: Generalized weakness(Grossly 3+/5. Overall WFL, some deficits may be attributed to difficulty with motor planning and following directions on this date.)   Lower Extremity Assessment Lower Extremity Assessment: Generalized weakness;Overall WFL for tasks assessed       Communication Communication Communication: No difficulties   Cognition Arousal/Alertness: Awake/alert Behavior During Therapy: WFL for tasks assessed/performed Overall Cognitive Status: Impaired/Different from baseline Area of Impairment: Safety/judgement;Following commands;Problem solving;Attention;Orientation                 Orientation Level: Disoriented to;Time;Situation;Place     Following Commands: Follows one step commands  with increased time;Follows one step commands inconsistently Safety/Judgement: Decreased awareness of safety;Decreased awareness of deficits   Problem Solving: Slow processing;Decreased initiation;Difficulty sequencing;Requires verbal cues;Requires tactile cues General Comments: Pt A & O to self only. Had difficulty following simple commands and using objects on this date. Deficits in motor planning and sequencing also observed. Consistent multimodal cueing and guidance required in order to direct to tasks. Pt unaware of deficits. Reported seeing people in room that weren't there but later stated "I'm bugging out" when recognizing no other people were in the room with her.   General Comments  Pt noted to have soiled sacral pad on buttocks upon standing. Small amount of BM in bed as well. OT changed chuck pad underneath pt and notified NT.    Exercises Other Exercises Other Exercises: Pt assisted with EOB grooming tasks on this date. Pt required set-up to max assist. Education limited due to decreased orientation and cognition.   Shoulder Instructions      Home Living Family/patient expects to be discharged to:: Private residence Living Arrangements: Children;Spouse/significant other Available Help at Discharge: Available PRN/intermittently Type of Home: Apartment Home Access: Stairs to enter [employer] of Steps:  Patient is a poor historian, per chart review, 2 steps in. Entrance Stairs-Rails: Right;Left;Can reach both Home Layout: One level     Bathroom Shower/Tub: Teacher, early years/pre: Standard     Home Equipment: Environmental consultant - 2 wheels;Cane - single point   Additional Comments: Per chart, pt lives with her daughter; her daughter is gone during the day at work; pt reports using [place] intermittently for ambulation; no recent falls reported though patient is a poor historian. .      Prior Functioning/Environment Level of Independence: Independent with assistive  device(s)                 OT Problem List: Decreased strength;Decreased coordination;Decreased range of motion;Decreased cognition;Decreased activity tolerance;Decreased safety awareness;Decreased knowledge of use of DME or AE;Impaired balance (sitting and/or standing);Decreased knowledge of precautions      OT Treatment/Interventions: Self-care/ADL training;Modalities;Therapeutic exercise;Therapeutic activities;Energy conservation;DME and/or AE instruction;Patient/family education;Balance training    OT Goals(Current goals can be found in the care plan section) Acute Rehab OT Goals Patient Stated Goal: Pt unable to state OT Goal Formulation: Patient unable to participate in goal setting Time For Goal Achievement: 10/09/18 Potential to Achieve Goals: Good ADL Goals Pt Will Perform Grooming: sitting;with set-up;with min assist Pt Will Perform Upper Body Bathing: sitting;with set-up;with min assist(With LRAD PRN for improved safety and functional independence) Pt Will Perform Upper Body Dressing: with set-up;sitting;with min guard assist(With LRAD PRN for improved safety and functional independence) Pt Will Transfer to Toilet: ambulating;bedside commode;with supervision(With LRAD PRN for improved safety and functional independence.)  OT Frequency: Min 1X/week   Barriers to D/C:            Co-evaluation              AM-PAC OT "6 Clicks" Daily Activity     Outcome Measure Help from another person eating meals?: A Little Help from another person taking care of personal grooming?: A Lot Help from another person toileting, which includes using toliet, bedpan, or urinal?: A Lot Help from another person bathing (including washing, rinsing, drying)?: A Lot Help from another person to put on and taking off regular upper body clothing?: A Little Help from another person to put on and taking off regular lower body clothing?: A Lot 6 Click Score: 14   End of Session Equipment  Utilized During Treatment: Gait belt;Rolling walker Nurse Communication: Other (comment)(Soiled sacral pad and beddingl; pt orientation.)  Activity Tolerance: Patient tolerated treatment well Patient left: in bed;with bed alarm set;with call bell/phone within reach  OT Visit Diagnosis: Other abnormalities of gait and mobility (R26.89);Muscle weakness (generalized) (M62.81);Apraxia (R48.2)                Time: 2426-8341 OT Time Calculation (min): 41 min Charges:  OT General Charges $OT Visit: 1 Visit OT Evaluation $OT Eval Moderate Complexity: 1 Mod OT Treatments $Self Care/Home Management : 23-37 mins  Shara Blazing, M.S., OTR/L Ascom: [phone] 09/25/18, 4:01 PM

## 2018-09-25 NOTE — TOC Progression Note (Signed)
Transition of Care [facility]) - Progression Note    Patient Details  Name: [name] MRN: [id number] Date of Birth: 1950-03-21  Transition of [facility]) CM/SW Contact  Shelbie Hutching, RN Phone Number: 09/25/2018, 2:53 PM  Clinical Narrative:    Daughter [name] called RNCM for help with PCS services.  Daughter was contacted by Janeece Riggers which manages PCS for Medicaid and reported that [facility] did not have available staffing.  RNCM reached out to [facility] and they just need the patient and daughter to pick another agency from the Medicaid approved list- I emailed this to the daughter- and then call [facility] with their choice.  RNCM will follow up with daughter tomorrow.  Home Health services at discharge will be provided by [facility].   Expected Discharge Plan: [place] Barriers to Discharge: Continued Medical Work up  Expected Discharge Plan and Services Expected Discharge Plan: [place]   Discharge Planning Services: CM Consult   Living arrangements for the past 2 months: Single Family Home                                       Social Determinants of Health (SDOH) Interventions    Readmission Risk Interventions Readmission Risk Prevention Plan 08/15/2018  Transportation Screening Complete  PCP or Specialist Appt within 3-5 Days Complete  HRI or [place] Complete  Palliative Care Screening Not Applicable  Medication Review (RN Care Manager) Complete  Some recent data might be hidden

## 2018-09-25 NOTE — TOC Progression Note (Signed)
Transition of Care [facility]) - Progression Note    Patient Details  Name: [name] MRN: [id number] Date of Birth: December 02, 1950  Transition of Care [facility]) CM/SW Contact  Shelbie Hutching, RN Phone Number: 09/25/2018, 4:00 PM  Clinical Narrative:    Daughter chooses [facility] in [place] called while in the room with the patient on speaker phone and reported agency choice to [facility] for [facility] services.     Expected Discharge Plan: [place] Barriers to Discharge: Continued Medical Work up  Expected Discharge Plan and Services Expected Discharge Plan: [place]   Discharge Planning Services: CM Consult   Living arrangements for the past 2 months: Single Family Home                                       Social Determinants of Health (SDOH) Interventions    Readmission Risk Interventions Readmission Risk Prevention Plan 08/15/2018  Transportation Screening Complete  PCP or Specialist Appt within 3-5 Days Complete  HRI or [place] Complete  Palliative Care Screening Not Applicable  Medication Review (RN Care Manager) Complete  Some recent data might be hidden

## 2018-09-26 ENCOUNTER — Inpatient Hospital Stay: Payer: Medicare Other

## 2018-09-26 LAB — CBC
HCT: 23.2 % — ABNORMAL LOW (ref 36.0–46.0)
Hemoglobin: 7 g/dL — ABNORMAL LOW (ref 12.0–15.0)
MCH: 23.5 pg — ABNORMAL LOW (ref 26.0–34.0)
MCHC: 30.2 g/dL (ref 30.0–36.0)
MCV: 77.9 fL — ABNORMAL LOW (ref 80.0–100.0)
Platelets: 157 10*3/uL (ref 150–400)
RBC: 2.98 MIL/uL — ABNORMAL LOW (ref 3.87–5.11)
RDW: 15.9 % — ABNORMAL HIGH (ref 11.5–15.5)
WBC: 4.2 10*3/uL (ref 4.0–10.5)
nRBC: 0 % (ref 0.0–0.2)

## 2018-09-26 LAB — HEMOGLOBIN AND HEMATOCRIT, BLOOD
HCT: 31.7 % — ABNORMAL LOW (ref 36.0–46.0)
Hemoglobin: 9.7 g/dL — ABNORMAL LOW (ref 12.0–15.0)

## 2018-09-26 LAB — BASIC METABOLIC PANEL
Anion gap: 4 — ABNORMAL LOW (ref 5–15)
BUN: 25 mg/dL — ABNORMAL HIGH (ref 8–23)
CO2: 22 mmol/L (ref 22–32)
Calcium: 8.3 mg/dL — ABNORMAL LOW (ref 8.9–10.3)
Chloride: 113 mmol/L — ABNORMAL HIGH (ref 98–111)
Creatinine, Ser: 1.52 mg/dL — ABNORMAL HIGH (ref 0.44–1.00)
GFR calc Af Amer: 41 mL/min — ABNORMAL LOW (ref >60)
GFR calc non Af Amer: 35 mL/min — ABNORMAL LOW (ref >60)
Glucose, Bld: 109 mg/dL — ABNORMAL HIGH (ref 70–99)
Potassium: 3.6 mmol/L (ref 3.5–5.1)
Sodium: 139 mmol/L (ref 135–145)

## 2018-09-26 LAB — PREPARE RBC (CROSSMATCH)

## 2018-09-26 LAB — HEMOGLOBIN: Hemoglobin: 7.4 g/dL — ABNORMAL LOW (ref 12.0–15.0)

## 2018-09-26 LAB — ABO/RH: ABO/RH(D): A POS

## 2018-09-26 MED ORDER — IOHEXOL 240 MG/ML SOLN
25.0000 mL | INTRAMUSCULAR | Status: AC
  Administered 2018-09-26 (×2): 25 mL via ORAL

## 2018-09-26 MED ORDER — SODIUM CHLORIDE 0.9% IV SOLUTION
Freq: Once | INTRAVENOUS | Status: AC
  Administered 2018-09-26: 11:00:00 via INTRAVENOUS

## 2018-09-26 NOTE — Progress Notes (Signed)
[place] at [place] NAME: [name]    MR#:  [id number]  DATE OF BIRTH:  August 23, 1950  SUBJECTIVE:  CHIEF COMPLAINT:   Chief Complaint  Patient presents with  . Altered Mental Status  Patient seen and evaluated today Patient had bloody stool last night No fever Awake and responds to verbal commands Has history of dementia Diarrhea is improved  REVIEW OF SYSTEMS:    ROS  CONSTITUTIONAL: No documented fever. Has fatigue, weakness. No weight gain, no weight loss.  EYES: No blurry or double vision.  ENT: No tinnitus. No postnasal drip. No redness of the oropharynx.  RESPIRATORY: No cough, no wheeze, no hemoptysis. No dyspnea.  CARDIOVASCULAR: No chest pain. No orthopnea. No palpitations. No syncope.  GASTROINTESTINAL: No nausea, no vomiting , has decreased diarrhea. No abdominal pain.  Had bloody stool last night GENITOURINARY: No dysuria or hematuria.  ENDOCRINE: No polyuria or nocturia. No heat or cold intolerance.  HEMATOLOGY: No anemia. No bruising. No bleeding.  INTEGUMENTARY: No rashes. No lesions.  MUSCULOSKELETAL: No arthritis. No swelling. No gout.  NEUROLOGIC: No numbness, tingling, or ataxia. No seizure-type activity.  PSYCHIATRIC: No anxiety. No insomnia. No ADD.   DRUG ALLERGIES:   Allergies  Allergen Reactions  . Ace Inhibitors Cough  . Lisinopril Cough    VITALS:  Blood pressure (!) 143/74, pulse 67, temperature (!) 97.4 F (36.3 C), temperature source Axillary, resp. rate 16, height 5' (1.524 m), weight 92.5 kg, SpO2 100 %.  PHYSICAL EXAMINATION:   Physical Exam  GENERAL:  68 y.o.-year-old patient lying in the bed with no acute distress.  EYES: Pupils equal, round, reactive to light and accommodation. No scleral icterus. Extraocular muscles intact.  Pallor present HEENT: Head atraumatic, normocephalic. Oropharynx dry and nasopharynx clear.  Pallor present NECK:  Supple, no jugular venous distention. No  thyroid enlargement, no tenderness.  LUNGS: Normal breath sounds bilaterally, no wheezing, rales, rhonchi. No use of accessory muscles of respiration.  CARDIOVASCULAR: S1, S2 normal. No murmurs, rubs, or gallops.  ABDOMEN: Soft, nontender, nondistended. Bowel sounds present. No organomegaly or mass.  EXTREMITIES: No cyanosis, clubbing or edema b/l.    NEUROLOGIC: Cranial nerves II through XII are intact. No focal Motor or sensory deficits b/l.   PSYCHIATRIC: The patient is alert and oriented x 2.  SKIN: No obvious rash, lesion, or ulcer.   LABORATORY PANEL:   CBC Recent Labs  Lab 09/26/18 0447  WBC 4.2  HGB 7.0*  HCT 23.2*  PLT 157   ------------------------------------------------------------------------------------------------------------------ Chemistries  Recent Labs  Lab 09/22/18 1134  09/26/18 0447  NA 140   < > 139  K 4.4   < > 3.6  CL 105   < > 113*  CO2 23   < > 22  GLUCOSE 124*   < > 109*  BUN 30*   < > 25*  CREATININE 2.50*   < > 1.52*  CALCIUM 8.2*   < > 8.3*  AST 19  --   --   ALT 12  --   --   ALKPHOS 66  --   --   BILITOT 0.7  --   --    < > = values in this interval not displayed.   ------------------------------------------------------------------------------------------------------------------  Cardiac Enzymes No results for input(s): TROPONINI in the last 168 hours. ------------------------------------------------------------------------------------------------------------------  RADIOLOGY:  Ct Abdomen Pelvis Wo Contrast  Result Date: 09/26/2018 CLINICAL DATA:  68 year old female with history of renal transplant presenting with  right red blood per rectum. EXAM: CT ABDOMEN AND PELVIS WITHOUT CONTRAST TECHNIQUE: Multidetector CT imaging of the abdomen and pelvis was performed following the standard protocol without IV contrast. COMPARISON:  CT of the abdomen pelvis dated 08/12/2018. FINDINGS: Evaluation of this exam is limited in the absence of  intravenous contrast. Lower chest: Probable trace bilateral pleural effusions versus pleural thickening. The visualized lung bases are otherwise clear. There is mild cardiomegaly and coronary vascular calcification. There is hypoattenuation of the cardiac blood pool suggestive of a degree of anemia. Clinical correlation is recommended. There is partially visualized pericardial effusion relatively similar to prior CT measuring up to 2.2 cm in thickness. No intra-abdominal free air or free fluid. Hepatobiliary: The liver is unremarkable. Cholecystectomy. No retained calcified stone noted in the central CBD. Pancreas: Unremarkable. No pancreatic ductal dilatation or surrounding inflammatory changes. Spleen: Normal in size without focal abnormality. Adrenals/Urinary Tract: The adrenal glands are unremarkable. Atrophic native right kidney. Postsurgical changes of left nephrectomy. There is a right lower quadrant transplant kidney. There is no hydronephrosis or nephrolithiasis of the transplant kidney. No peritransplant fluid collection. The urinary bladder is partially distended. There is slight haziness of the bladder wall. Correlation with urinalysis recommended to exclude cystitis. Stomach/Bowel: Mild thickened appearance of the distal colon concerning for mild colitis. Several small scattered sigmoid diverticula without active inflammatory changes noted. There is no bowel obstruction. The appendix is not visualized with certainty. No inflammatory changes identified in the right lower quadrant. Vascular/Lymphatic: Mild aortoiliac atherosclerotic disease. No portal venous gas. There is no adenopathy. Reproductive: Hysterectomy. No pelvic mass. Other: Midline vertical anterior pelvic wall incisional scar. A ventral hernia repair mesh noted. Musculoskeletal: Osteopenia. No acute osseous pathology. Bilateral femoral head avascular necrosis, right greater left. No cortical collapse or fracture. IMPRESSION: 1. Slight  thickened appearance of the distal colon concerning for mild colitis. No bowel obstruction. 2. Small sigmoid diverticula without active inflammatory changes. 3. Mild haziness of the bladder wall. Correlation with urinalysis recommended to exclude cystitis. 4. Right lower quadrant transplant kidney. No hydronephrosis or nephrolithiasis. 5. Bilateral femoral head avascular necrosis, right greater left. No cortical collapse or fracture. 6. Aortic Atherosclerosis (ICD10-I70.0). Electronically Signed   By: Anner Crete M.D.   On: 09/26/2018 02:19     ASSESSMENT AND PLAN:   68 year old female patient with history of dementia, GERD, hypertension, chronic kidney disease with history of renal transplant and recently diagnosed UTI currently under hospitalist service  -Acute C. difficile colitis Continue oral vancomycin Encourage oral intake and stop IV fluids Enteric precautions Electrolyte management  -Anemia acute Secondary to C. difficile colitis Transfuse 1 unit PRBC IV Monitor hemoglobin hematocrit  -Acute encephalopathy  Appears metabolic Improved Discontinued IV ceftriaxone  -Recurrent UTI  VRE in the urine Start Macrodantin  -Ambulatory dysfunction Physical therapy evaluation appreciated Home health with home physical therapy on disposition  -Hypertension Continue Lopressor  -Hyperlipidemia Continue Lipitor   -Acute on chronic kidney disease with history of renal transplant Improved with IV fluids Follow-up creatinine Avoid nephrotoxic meds Creatinine is come down  -DVT prophylaxis Change to subcu heparin  -Updated family  All the records are reviewed and case discussed with Care Management/Social Worker. Management plans discussed with the patient, family and they are in agreement.  CODE STATUS: Full code  DVT Prophylaxis: SCDs  TOTAL TIME TAKING CARE OF THIS PATIENT: 36 minutes.   POSSIBLE D/C IN 2 to 3 DAYS, DEPENDING ON CLINICAL CONDITION.  Saundra Shelling M.D on 09/26/2018 at 11:15  AM  Between 7am to 6pm - Pager - [phone]  After 6pm go to [email or web address] - password [email or web address] [facility]  Office  [phone]  CC: Primary care physician; [place]  Note: This dictation was prepared with Dragon dictation along with smaller phrase technology. Any transcriptional errors that result from this process are unintentional.

## 2018-09-27 LAB — BPAM RBC
Blood Product Expiration Date: 202009082359
ISSUE DATE / TIME: 202008121136
Unit Type and Rh: 6200

## 2018-09-27 LAB — TYPE AND SCREEN
ABO/RH(D): A POS
Antibody Screen: NEGATIVE
Unit division: 0

## 2018-09-27 LAB — HEMOGLOBIN AND HEMATOCRIT, BLOOD
HCT: 30.4 % — ABNORMAL LOW (ref 36.0–46.0)
Hemoglobin: 9.3 g/dL — ABNORMAL LOW (ref 12.0–15.0)

## 2018-09-27 MED ORDER — ENSURE ENLIVE PO LIQD: 237.0000 mL | Freq: Two times a day (BID) | ORAL | 12 refills | Status: AC

## 2018-09-27 MED ORDER — NITROFURANTOIN MACROCRYSTAL 50 MG PO CAPS: 50.0000 mg | ORAL_CAPSULE | Freq: Two times a day (BID) | ORAL | 0 refills | Status: AC

## 2018-09-27 MED ORDER — VANCOMYCIN 50 MG/ML ORAL SOLUTION: 125.0000 mg | Freq: Four times a day (QID) | ORAL | 0 refills | Status: AC

## 2018-09-27 NOTE — Discharge Summary (Signed)
[facility] at [place] NAME: [name]    MR#:  [id number]  DATE OF BIRTH:  1950-05-01  DATE OF ADMISSION:  09/22/2018 ADMITTING PHYSICIAN: Dustin Flock, MD  DATE OF DISCHARGE: 09/27/2018  PRIMARY CARE PHYSICIAN: Baxter Hire, MD   ADMISSION DIAGNOSIS:  Dehydration [E86.0] Disorientation [R41.0] Acute cystitis with hematuria [N30.01] Acute encephalopathy [G93.40]  DISCHARGE DIAGNOSIS:  Active Problems:   Acute encephalopathy   Pressure injury of skin Acute C. difficile colitis Metabolic encephalopathy Anemia Dehydration Urinary tract infection Acute on chronic kidney disease stage III With history of renal transplant SECONDARY DIAGNOSIS:   Past Medical History:  Diagnosis Date  . Anemia   . Cancer of kidney ([place])   . Dementia ([place])   . GERD (gastroesophageal reflux disease)   . Hypertension   . Renal disorder   . Uterine cancer [facility])      ADMITTING HISTORY [name]  is a 68 y.o. female with a known history of dementia, GERD, hypertension, chronic kidney disease history of renal transplant who was seen in the ER on 7/25 at that time was diagnosed with a UTI was discharged on oral antibiotics.  Patient is sent back to the ER because progressively less responsive and confused.  Patient currently is awake here but is confused.  She does have dementia.  Further review of system is unobtainable.  Patient does complain of some pain in the right ear.  HOSPITAL COURSE:  Patient admitted to the medical floor.  Initially started on antibiotics for UTI was started on IV fluids for dehydration.  Patient's also had diarrhea and stool for C. difficile was positive.  She was started on oral vancomycin.  Tolerated antibiotic well and diarrhea improved.  Dehydration resolved with IV fluids.  Patient received IV Rocephin antibiotic for urinary tract infection and later on was switched to oral Macrodantin based on culture sensitivity.   Urine culture grew vancomycin-resistant enterococcus sensitive to Macrodantin.  Patient started on antibiotic.  Her C. difficile colitis improved.  Diarrhea resolved.  Tolerating diet well.  Patient received physical therapy.  She will be discharged home with home health services.  Patient also had anemia during the hospitalization was transfused with 1 unit PRBC IV.  Hemoglobin stable 9.3 at the time of discharge.  With IV fluid hydration kidney functions improved, creatinine improved.  CONSULTS OBTAINED:    DRUG ALLERGIES:   Allergies  Allergen Reactions  . Ace Inhibitors Cough  . Lisinopril Cough    DISCHARGE MEDICATIONS:   Allergies as of 09/27/2018      Reactions   Ace Inhibitors Cough   Lisinopril Cough      Medication List    STOP taking these medications   metoprolol succinate 25 MG 24 hr tablet Commonly known as: TOPROL-XL     TAKE these medications   atorvastatin 40 MG tablet Commonly known as: LIPITOR Take 40 mg by mouth daily.   calcitRIOL 0.25 MCG capsule Commonly known as: ROCALTROL Take 0.25 mcg by mouth daily.   cholecalciferol 25 MCG (1000 UT) tablet Commonly known as: VITAMIN D3 Take 1,000 Units by mouth daily.   citalopram 20 MG tablet Commonly known as: CELEXA Take 20 mg by mouth daily.   donepezil 5 MG tablet Commonly known as: ARICEPT Take 5 mg by mouth at bedtime.   feeding supplement (ENSURE ENLIVE) Liqd Take 237 mLs by mouth 2 (two) times daily between meals.   folic acid 1 MG tablet Commonly known as:  FOLVITE Take 1 mg by mouth 2 (two) times daily.   furosemide 20 MG tablet Commonly known as: LASIX Take 1 tablet (20 mg total) by mouth daily.   Melatonin 10 MG Tabs Take 20 mg by mouth at bedtime.   meloxicam 15 MG tablet Commonly known as: MOBIC Take 15 mg by mouth daily.   metoprolol tartrate 25 MG tablet Commonly known as: LOPRESSOR Take 25 mg by mouth 2 (two) times daily.   mycophenolate 180 MG EC tablet Commonly  known as: MYFORTIC Take 180 mg by mouth 2 (two) times daily.   nitrofurantoin 50 MG capsule Commonly known as: MACRODANTIN Take 1 capsule (50 mg total) by mouth every 12 (twelve) hours for 2 days.   potassium chloride 10 MEQ tablet Commonly known as: K-DUR Take 2 tablets (20 mEq total) by mouth daily. ONLY WHILE TAKING LASIX   sirolimus 1 MG tablet Commonly known as: RAPAMUNE Take 3 mg by mouth daily.   Tums Ultra 1000 400 MG chewable tablet Generic drug: calcium elemental as carbonate Chew 1,000 mg by mouth daily as needed for heartburn.   vancomycin 50 mg/mL  oral solution Commonly known as: VANCOCIN Take 2.5 mLs (125 mg total) by mouth 4 (four) times daily for 7 days.       Today  Patient seen today Tolerating diet well No bloody stool No diarrhea Hemodynamically stable VITAL SIGNS:  Blood pressure (!) 164/71, pulse 60, temperature 98.4 F (36.9 C), temperature source Oral, resp. rate 16, height 5' (1.524 m), weight 92.5 kg, SpO2 100 %.  I/O:    Intake/Output Summary (Last 24 hours) at 09/27/2018 1132 Last data filed at 09/27/2018 0500 Gross per 24 hour  Intake 0 ml  Output 1400 ml  Net -1400 ml    PHYSICAL EXAMINATION:  Physical Exam  GENERAL:  68 y.o.-year-old patient lying in the bed with no acute distress.  LUNGS: Normal breath sounds bilaterally, no wheezing, rales,rhonchi or crepitation. No use of accessory muscles of respiration.  CARDIOVASCULAR: S1, S2 normal. No murmurs, rubs, or gallops.  ABDOMEN: Soft, non-tender, non-distended. Bowel sounds present. No organomegaly or mass.  NEUROLOGIC: Moves all 4 extremities. PSYCHIATRIC: The patient is alert and oriented x 3.  SKIN: No obvious rash, lesion, or ulcer.   DATA REVIEW:   CBC Recent Labs  Lab 09/26/18 0447  09/27/18 0548  WBC 4.2  --   --   HGB 7.0*   < > 9.3*  HCT 23.2*   < > 30.4*  PLT 157  --   --    < > = values in this interval not displayed.    Chemistries  Recent Labs  Lab  09/22/18 1134  09/26/18 0447  NA 140   < > 139  K 4.4   < > 3.6  CL 105   < > 113*  CO2 23   < > 22  GLUCOSE 124*   < > 109*  BUN 30*   < > 25*  CREATININE 2.50*   < > 1.52*  CALCIUM 8.2*   < > 8.3*  AST 19  --   --   ALT 12  --   --   ALKPHOS 66  --   --   BILITOT 0.7  --   --    < > = values in this interval not displayed.    Cardiac Enzymes No results for input(s): TROPONINI in the last 168 hours.  Microbiology Results  Results for orders placed or performed during  the hospital encounter of 09/22/18  Urine culture     Status: None   Collection Time: 09/22/18  1:15 PM   Specimen: Urine, Clean Catch  Result Value Ref Range Status   Specimen Description   Final    URINE, CLEAN CATCH Performed at [facility], [address], [place], [place] [address]    Special Requests   Final    Normal Performed at [facility], [address], [place], [place] [address]    Culture   Final    NO GROWTH Performed at [facility], [place] [address] [place], [place] [address]    Report Status 09/23/2018 FINAL  Final  SARS CORONAVIRUS 2 Nasal Swab Aptima Multi Swab     Status: None   Collection Time: 09/22/18  2:07 PM   Specimen: Aptima Multi Swab; Nasal Swab  Result Value Ref Range Status   SARS Coronavirus 2 NEGATIVE NEGATIVE Final    Comment: (NOTE) SARS-CoV-2 target nucleic acids are NOT DETECTED. The SARS-CoV-2 RNA is generally detectable in upper and lower respiratory specimens during the acute phase of infection. Negative results do not preclude SARS-CoV-2 infection, do not rule out co-infections with other pathogens, and should not be used as the sole basis for treatment or other patient management decisions. Negative results must be combined with clinical observations, patient history, and epidemiological information. The expected result is Negative. Fact Sheet for Patients: SugarRoll.be Fact Sheet for Healthcare  Providers: https://www.woods-mathews.com/ This test is not yet approved or cleared by the [place] FDA and  has been authorized for detection and/or diagnosis of SARS-CoV-2 by FDA under an Emergency Use Authorization (EUA). This EUA will remain  in effect (meaning this test can be used) for the duration of the COVID-19 declaration under Section 56 4(b)(1) of the Act, 21 U.S.C. section 360bbb-3(b)(1), unless the authorization is terminated or revoked sooner. Performed at [facility], [place] [address], [place], [place] [address]   Urine culture     Status: Abnormal   Collection Time: 09/23/18  9:54 PM   Specimen: Urine, Random  Result Value Ref Range Status   Specimen Description   Final    URINE, RANDOM Performed at [facility], [address] [place], [place] [address]    Special Requests   Final    NONE Performed at [facility], [address] [place], [place] [address]    Culture (A)  Final    80,000 COLONIES/mL VANCOMYCIN RESISTANT ENTEROCOCCUS ISOLATED   Report Status 09/25/2018 FINAL  Final   Organism ID, Bacteria VANCOMYCIN RESISTANT ENTEROCOCCUS ISOLATED (A)  Final      Susceptibility   Vancomycin resistant enterococcus isolated - MIC*    AMPICILLIN >=32 RESISTANT Resistant     LEVOFLOXACIN >=8 RESISTANT Resistant     NITROFURANTOIN 32 SENSITIVE Sensitive     VANCOMYCIN >=32 RESISTANT Resistant     * 80,000 COLONIES/mL VANCOMYCIN RESISTANT ENTEROCOCCUS ISOLATED  C difficile quick scan w PCR reflex     Status: Abnormal   Collection Time: 09/23/18  9:54 PM   Specimen: STOOL  Result Value Ref Range Status   C Diff antigen POSITIVE (A) NEGATIVE Final   C Diff toxin NEGATIVE NEGATIVE Final   C Diff interpretation Results are indeterminate. See PCR results.  Final    Comment: Performed at [facility], [address] [place],  [address]  C. Diff by PCR, Reflexed     Status: Abnormal   Collection Time:  09/23/18  9:54 PM  Result Value Ref Range Status   Toxigenic C. Difficile by PCR POSITIVE (A) NEGATIVE Final    Comment: Positive for toxigenic C. difficile with little to no toxin production. Only treat if clinical presentation suggests symptomatic illness. Performed at [facility], [address] [place], [place] [address]     RADIOLOGY:  Ct Abdomen Pelvis Wo Contrast  Result Date: 09/26/2018 CLINICAL DATA:  68 year old female with history of renal transplant presenting with right red blood per rectum. EXAM: CT ABDOMEN AND PELVIS WITHOUT CONTRAST TECHNIQUE: Multidetector CT imaging of the abdomen and pelvis was performed following the standard protocol without IV contrast. COMPARISON:  CT of the abdomen pelvis dated 08/12/2018. FINDINGS: Evaluation of this exam is limited in the absence of intravenous contrast. Lower chest: Probable trace bilateral pleural effusions versus pleural thickening. The visualized lung bases are otherwise clear. There is mild cardiomegaly and coronary vascular calcification. There is hypoattenuation of the cardiac blood pool suggestive of a degree of anemia. Clinical correlation is recommended. There is partially visualized pericardial effusion relatively similar to prior CT measuring up to 2.2 cm in thickness. No intra-abdominal free air or free fluid. Hepatobiliary: The liver is unremarkable. Cholecystectomy. No retained calcified stone noted in the central CBD. Pancreas: Unremarkable. No pancreatic ductal dilatation or surrounding inflammatory changes. Spleen: Normal in size without focal abnormality. Adrenals/Urinary Tract: The adrenal glands are unremarkable. Atrophic native right kidney. Postsurgical changes of left nephrectomy. There is a right lower quadrant transplant kidney. There is no hydronephrosis or nephrolithiasis of the transplant kidney. No peritransplant fluid collection. The urinary bladder is partially distended. There is slight haziness of the  bladder wall. Correlation with urinalysis recommended to exclude cystitis. Stomach/Bowel: Mild thickened appearance of the distal colon concerning for mild colitis. Several small scattered sigmoid diverticula without active inflammatory changes noted. There is no bowel obstruction. The appendix is not visualized with certainty. No inflammatory changes identified in the right lower quadrant. Vascular/Lymphatic: Mild aortoiliac atherosclerotic disease. No portal venous gas. There is no adenopathy. Reproductive: Hysterectomy. No pelvic mass. Other: Midline vertical anterior pelvic wall incisional scar. A ventral hernia repair mesh noted. Musculoskeletal: Osteopenia. No acute osseous pathology. Bilateral femoral head avascular necrosis, right greater left. No cortical collapse or fracture. IMPRESSION: 1. Slight thickened appearance of the distal colon concerning for mild colitis. No bowel obstruction. 2. Small sigmoid diverticula without active inflammatory changes. 3. Mild haziness of the bladder wall. Correlation with urinalysis recommended to exclude cystitis. 4. Right lower quadrant transplant kidney. No hydronephrosis or nephrolithiasis. 5. Bilateral femoral head avascular necrosis, right greater left. No cortical collapse or fracture. 6. Aortic Atherosclerosis (ICD10-I70.0). Electronically Signed   By: Anner Crete M.D.   On: 09/26/2018 02:19    Follow up with PCP in 1 week.  Management plans discussed with the patient, family and they are in agreement.  CODE STATUS: Full code    Code Status Orders  (From admission, onward)         Start     Ordered   09/22/18 1853  Full code  Continuous     09/22/18 1852        Code Status History    Date Active Date Inactive Code Status Order ID Comments User Context   08/12/2018 1749 08/15/2018 1758 Full Code [id number]  Demetrios Loll, MD Inpatient   06/20/2018 0041 06/21/2018 1616 Full Code [id number]  Lance Coon, MD Inpatient   03/23/2018 [place] 03/26/2018 1620  Full Code [id number]  Marcille Blanco,  Norva Riffle, MD Inpatient   11/21/2017 0839 11/23/2017 1708 Full Code [id number]  Fritzi Mandes, MD Inpatient   06/01/2017 1402 06/03/2017 1831 Full Code [id number]  Loletha Grayer, MD ED   06/29/2016 2314 07/02/2016 1712 Full Code [id number]  Lance Coon, MD Inpatient   07/06/2014 2201 07/08/2014 1537 Full Code [id number]  Hower, Aaron Mose, MD ED   Advance Care Planning Activity    Advance Directive Documentation     Most Recent Value  Type of Advance Directive  Healthcare Power of Attorney  Pre-existing out of facility DNR order (yellow form or pink MOST form)  -  "MOST" Form in Place?  -      TOTAL TIME TAKING CARE OF THIS PATIENT ON DAY OF DISCHARGE: more than 35 minutes.   Saundra Shelling M.D on 09/27/2018 at 11:32 AM  Between 7am to 6pm - Pager - [phone]  After 6pm go to [email or web address] - password [email or web address] [facility]  Office  [phone]  CC: Primary care physician; Baxter Hire, MD  Note: This dictation was prepared with Dragon dictation along with smaller phrase technology. Any transcriptional errors that result from this process are unintentional.

## 2018-09-27 NOTE — TOC Transition Note (Signed)
Transition of Care [facility]) - CM/SW Discharge Note   Patient Details  Name: [name] MRN: [id number] Date of Birth: 11/24/1950  [facility] of [facility] CM/SW Contact:  Shelbie Hutching, RN Phone Number: 09/27/2018, 10:04 AM   Clinical Narrative:    Patient ready for discharge today.  Patient will discharge home with home health services through [facility].  Patient lives with her daughter and daughter will provide transportation home.  Patient will have PCS services through [facility] covered by her Medicaid.    Final next level of care: [place] Barriers to Discharge: Barriers Resolved   Patient Goals and CMS Choice        Discharge Placement                       Discharge Plan and Services   Discharge Planning Services: CM Consult                      HH Arranged: RN, PT [facility] Agency: [place] ([place]) Date HH Agency Contacted: 09/27/18 Time [place]: 1004 Representative spoke with at [place]: [place] (SDOH) Interventions     Readmission Risk Interventions Readmission Risk Prevention Plan 08/15/2018  Transportation Screening Complete  PCP or Specialist Appt within 3-5 Days Complete  HRI or Home Care Consult Complete  Palliative Care Screening Not Applicable  Medication Review (RN Care Manager) Complete  Some recent data might be hidden

## 2018-09-27 NOTE — Progress Notes (Signed)
Discharge instructions , medications, prescriptions and follow up appointments reviewed with daughter Cydney Ok over the phone.  Home meds and discharge package along with prescriptions given to husband Joclyn Alsobrook at discharge .

## 2018-09-27 NOTE — Care Management Important Message (Signed)
Important Message  Patient Details  Name: [name] MRN: [id number] Date of Birth: Dec 18, 1950   Medicare Important Message Given:  Yes Important message reviewed with daughter over the phone.  Patient has dementia.  Patient will discharge home today with daughter.  Copy of IM given to daughter with discharge packet.     Shelbie Hutching, RN 09/27/2018, 12:01 PM

## 2018-10-15 ENCOUNTER — Other Ambulatory Visit: Payer: Self-pay

## 2018-10-15 ENCOUNTER — Ambulatory Visit (INDEPENDENT_AMBULATORY_CARE_PROVIDER_SITE_OTHER): Payer: Medicare Other | Admitting: Gastroenterology

## 2018-10-15 VITALS — BP 149/82 | HR 60 | Temp 97.6°F | Ht 59.0 in | Wt 196.0 lb

## 2018-10-15 DIAGNOSIS — A0472 Enterocolitis due to Clostridium difficile, not specified as recurrent: Secondary | ICD-10-CM

## 2018-10-15 NOTE — Progress Notes (Signed)
Jonathon Bellows MD, MRCP(U.K) [address]  [place]  [place], [place] [address]  Main: [phone]  Fax: [phone]   Gastroenterology Consultation  Referring Provider:     Murlean Iba, MD Primary Care Physician:  Baxter Hire, MD Primary Gastroenterologist:  Dr. Jonathon Bellows  Reason for Consultation:     Diarrhea        HPI:   [name] is a 68 y.o. y/o female referred for diarrhea.  She has a history of prior renal transplant.  History of dementia.  Recent episodes of UTI.  Recent admission in 10/04/2018 for UTI and C. difficile diarrhea.  Finished course of vancomycin on 10/07/2018.  She was given subsequently a second course of vancomycin by Dr. Edwina Barth as the diarrhea persisted.  She has got 3 more days left but states that presently she has no diarrhea and has 1 solid bowel movement per day.  Denies any abdominal pain.  Still a bit confused.  No other complaints.  She is here with her daughter for the visit.  Past Medical History:  Diagnosis Date   Anemia    Cancer of kidney ([place])    Dementia ([place])    GERD (gastroesophageal reflux disease)    Hypertension    Renal disorder    Uterine cancer ([place])     Past Surgical History:  Procedure Laterality Date   ABDOMINAL HYSTERECTOMY     AV FISTULA PLACEMENT     BREAST BIOPSY     Patient is unsure but she thinks she remember a Bx   BREAST BIOPSY Right 2017   benign   KIDNEY TRANSPLANT     NEPHRECTOMY TRANSPLANTED ORGAN      Prior to Admission medications   Medication Sig Start Date End Date Taking? Authorizing Provider  atorvastatin (LIPITOR) 40 MG tablet Take 40 mg by mouth daily.     [provider]  calcitRIOL (ROCALTROL) 0.25 MCG capsule Take 0.25 mcg by mouth daily.    [provider]  calcium elemental as carbonate (TUMS ULTRA 1000) 400 MG chewable tablet Chew 1,000 mg by mouth daily as needed for heartburn.    [provider]  cholecalciferol (VITAMIN D3) 25  MCG (1000 UT) tablet Take 1,000 Units by mouth daily.    [provider]  citalopram (CELEXA) 20 MG tablet Take 20 mg by mouth daily.    [provider]  donepezil (ARICEPT) 5 MG tablet Take 5 mg by mouth at bedtime.     [provider]  feeding supplement, ENSURE ENLIVE, (ENSURE ENLIVE) LIQD Take 237 mLs by mouth 2 (two) times daily between meals. 09/27/18   Saundra Shelling, MD  folic acid (FOLVITE) 1 MG tablet Take 1 mg by mouth 2 (two) times daily.     [provider]  furosemide (LASIX) 20 MG tablet Take 1 tablet (20 mg total) by mouth daily. 08/17/18   Gladstone Lighter, MD  Melatonin 10 MG TABS Take 20 mg by mouth at bedtime.    [provider]  meloxicam (MOBIC) 15 MG tablet Take 15 mg by mouth daily. 06/14/18   [provider]  metoprolol tartrate (LOPRESSOR) 25 MG tablet Take 25 mg by mouth 2 (two) times daily.    [provider]  mycophenolate (MYFORTIC) 180 MG EC tablet Take 180 mg by mouth 2 (two) times daily.    [provider]  potassium chloride (K-DUR) 10 MEQ tablet Take 2 tablets (20 mEq total) by mouth daily. ONLY WHILE TAKING  LASIX 08/17/18   Gladstone Lighter, MD  sirolimus (RAPAMUNE) 1 MG tablet Take 3 mg by mouth daily.     [provider]    Family History  Problem Relation Age of Onset   Stroke Mother    Alcohol abuse Father    Breast cancer Neg Hx      Social History   Tobacco Use   Smoking status: Former Smoker   Smokeless tobacco: Never Used  Substance Use Topics   Alcohol use: No   Drug use: No    Allergies as of 10/15/2018 - Review Complete 09/22/2018  Allergen Reaction Noted   Ace inhibitors Cough 06/30/2014   Lisinopril Cough 07/06/2014    Review of Systems:    All systems reviewed and negative except where noted in HPI.   Physical Exam:  There were no vitals taken for this visit. No LMP recorded. Patient has had a hysterectomy. Psych:  Alert and cooperative.  Normal mood and affect. General:   Alert,  Well-developed, well-nourished, pleasant and cooperative in NAD Head:  Normocephalic and atraumatic. Eyes:  Sclera clear, no icterus.   Conjunctiva pink. Ears:  Normal auditory acuity. Nose:  No deformity, discharge, or lesions. Mouth:  No deformity or lesions,oropharynx pink & moist. Neck:  Supple; no masses or thyromegaly. Lungs:  Respirations even and unlabored.  Clear throughout to auscultation.   No wheezes, crackles, or rhonchi. No acute distress. Heart:  Regular rate and rhythm; no murmurs, clicks, rubs, or gallops. Abdomen:  Normal bowel sounds.  No bruits.  Soft, non-tender and non-distended without masses, hepatosplenomegaly or hernias noted.  No guarding or rebound tenderness.    Neurologic:  Alert and oriented x1;  grossly normal neurologically. Skin:  Intact without significant lesions or rashes. No jaundice. Lymph Nodes:  No significant cervical adenopathy. Psych:  Alert and cooperative. Normal mood and affect.  Imaging Studies: Ct Abdomen Pelvis Wo Contrast  Result Date: 09/26/2018 CLINICAL DATA:  68 year old female with history of renal transplant presenting with right red blood per rectum. EXAM: CT ABDOMEN AND PELVIS WITHOUT CONTRAST TECHNIQUE: Multidetector CT imaging of the abdomen and pelvis was performed following the standard protocol without IV contrast. COMPARISON:  CT of the abdomen pelvis dated 08/12/2018. FINDINGS: Evaluation of this exam is limited in the absence of intravenous contrast. Lower chest: Probable trace bilateral pleural effusions versus pleural thickening. The visualized lung bases are otherwise clear. There is mild cardiomegaly and coronary vascular calcification. There is hypoattenuation of the cardiac blood pool suggestive of a degree of anemia. Clinical correlation is recommended. There is partially visualized pericardial effusion relatively similar to prior CT measuring up to 2.2 cm in thickness. No  intra-abdominal free air or free fluid. Hepatobiliary: The liver is unremarkable. Cholecystectomy. No retained calcified stone noted in the central CBD. Pancreas: Unremarkable. No pancreatic ductal dilatation or surrounding inflammatory changes. Spleen: Normal in size without focal abnormality. Adrenals/Urinary Tract: The adrenal glands are unremarkable. Atrophic native right kidney. Postsurgical changes of left nephrectomy. There is a right lower quadrant transplant kidney. There is no hydronephrosis or nephrolithiasis of the transplant kidney. No peritransplant fluid collection. The urinary bladder is partially distended. There is slight haziness of the bladder wall. Correlation with urinalysis recommended to exclude cystitis. Stomach/Bowel: Mild thickened appearance of the distal colon concerning for mild colitis. Several small scattered sigmoid diverticula without active inflammatory changes noted. There is no bowel obstruction. The appendix is not visualized with certainty. No inflammatory changes identified in the right lower quadrant. Vascular/Lymphatic: Mild  aortoiliac atherosclerotic disease. No portal venous gas. There is no adenopathy. Reproductive: Hysterectomy. No pelvic mass. Other: Midline vertical anterior pelvic wall incisional scar. A ventral hernia repair mesh noted. Musculoskeletal: Osteopenia. No acute osseous pathology. Bilateral femoral head avascular necrosis, right greater left. No cortical collapse or fracture. IMPRESSION: 1. Slight thickened appearance of the distal colon concerning for mild colitis. No bowel obstruction. 2. Small sigmoid diverticula without active inflammatory changes. 3. Mild haziness of the bladder wall. Correlation with urinalysis recommended to exclude cystitis. 4. Right lower quadrant transplant kidney. No hydronephrosis or nephrolithiasis. 5. Bilateral femoral head avascular necrosis, right greater left. No cortical collapse or fracture. 6. Aortic Atherosclerosis  (ICD10-I70.0). Electronically Signed   By: Anner Crete M.D.   On: 09/26/2018 02:19   Dg Chest Portable 1 View  Result Date: 09/22/2018 CLINICAL DATA:  Weakness and confusion. EXAM: PORTABLE CHEST 1 VIEW COMPARISON:  08/01/2018 and prior radiographs FINDINGS: Cardiomegaly and pulmonary vascular congestion again noted. There is no evidence of focal airspace disease, pulmonary edema, suspicious pulmonary nodule/mass, pleural effusion, or pneumothorax. No acute bony abnormalities are identified. IMPRESSION: Cardiomegaly with pulmonary vascular congestion. Electronically Signed   By: Margarette Canada M.D.   On: 09/22/2018 13:34    Assessment and Plan:   [name] is a 68 y.o. y/o female who has a history of dementia, renal cell transplant.  Admitted for UTI and developed C. difficile diarrhea secondary to antibiotic use.  Recently discharged on a course of antibiotics of vancomycin for 10 days.  When seen by her primary care doctor she continued to have diarrhea and hence the vancomycin was extended for a further 10 days.  She has 3 more days to complete the treatment and clinically appears that the diarrhea has resolved.  I suggested that she completes the course of antibiotics and we watch what happens.  If it were to recur we could try treating her with Dificid.    Follow up in 2 weeks video visit  Dr Jonathon Bellows MD,MRCP(U.K)

## 2018-10-25 ENCOUNTER — Ambulatory Visit (INDEPENDENT_AMBULATORY_CARE_PROVIDER_SITE_OTHER): Payer: Medicare Other | Admitting: Gastroenterology

## 2018-10-25 DIAGNOSIS — A0472 Enterocolitis due to Clostridium difficile, not specified as recurrent: Secondary | ICD-10-CM | POA: Diagnosis not present

## 2018-10-25 NOTE — Progress Notes (Signed)
[name] , MD [address]  [place]  [name], [name] [address]  Main: [phone]  Fax: [phone]   Primary Care Physician: Baxter Hire, MD  Virtual Visit via Telephone Note  I connected with patient on 10/25/18 at  1:00 PM EDT by telephone and verified that I am speaking with the correct person using two identifiers.   I discussed the limitations, risks, security and privacy concerns of performing an evaluation and management service by telephone and the availability of in person appointments. I also discussed with the patient that there may be a patient responsible charge related to this service. The patient expressed understanding and agreed to proceed.  Location of Patient: Home Location of Provider: Home Persons involved: Patient and provider only   History of Present Illness:  Follow-up :C. difficile colitis  HPI: [name] is a 68 y.o. female   Summary of history : I was consulted to see the patient on 10/15/2018.  Referred for C. difficile diarrhea.  She has a history of prior renal transplant.  History of dementia.  Recent episodes of UTI.  Recent admission in 10/04/2018 for UTI and C. difficile diarrhea.  Finished course of vancomycin on 10/07/2018.  She was given subsequently a second course of vancomycin by Dr. Edwina Barth as the diarrhea persisted.     Interval history   10/15/2018-10/25/2018  Spoke with the daughter. She is still having diarrhea and has not had any benefit from the vancomycin.  Denies any other complaints.  Current Outpatient Medications  Medication Sig Dispense Refill   atorvastatin (LIPITOR) 40 MG tablet Take 40 mg by mouth daily.      calcitRIOL (ROCALTROL) 0.25 MCG capsule Take 0.25 mcg by mouth daily.     calcium elemental as carbonate (TUMS ULTRA 1000) 400 MG chewable tablet Chew 1,000 mg by mouth daily as needed for heartburn.     cholecalciferol (VITAMIN D3) 25 MCG (1000 UT) tablet Take 1,000 Units by mouth daily.      citalopram (CELEXA) 20 MG tablet Take 20 mg by mouth daily.     donepezil (ARICEPT) 5 MG tablet Take 5 mg by mouth at bedtime.      feeding supplement, ENSURE ENLIVE, (ENSURE ENLIVE) LIQD Take 237 mLs by mouth 2 (two) times daily between meals. 856 mL 12   folic acid (FOLVITE) 1 MG tablet Take 1 mg by mouth 2 (two) times daily.      furosemide (LASIX) 20 MG tablet Take 1 tablet (20 mg total) by mouth daily. 30 tablet 0   Melatonin 10 MG TABS Take 20 mg by mouth at bedtime.     meloxicam (MOBIC) 15 MG tablet Take 15 mg by mouth daily.     metoprolol tartrate (LOPRESSOR) 25 MG tablet Take 25 mg by mouth 2 (two) times daily.     mycophenolate (MYFORTIC) 180 MG EC tablet Take 180 mg by mouth 2 (two) times daily.     potassium chloride (K-DUR) 10 MEQ tablet Take 2 tablets (20 mEq total) by mouth daily. ONLY WHILE TAKING LASIX 30 tablet 0   sirolimus (RAPAMUNE) 1 MG tablet Take 3 mg by mouth daily.      traZODone (DESYREL) 50 MG tablet Take by mouth.     No current facility-administered medications for this visit.     Allergies as of 10/25/2018 - Review Complete 10/15/2018  Allergen Reaction Noted   Ace inhibitors Cough 06/30/2014   Lisinopril Cough 07/06/2014    Review of Systems:  All systems reviewed and negative except where noted in HPI.   Observations/Objective:  Labs: CMP     Component Value Date/Time   NA 139 09/26/2018 0447   NA 142 02/25/2014 0752   K 3.6 09/26/2018 0447   K 3.3 (L) 02/25/2014 0752   CL 113 (H) 09/26/2018 0447   CL 110 (H) 02/25/2014 0752   CO2 22 09/26/2018 0447   CO2 24 02/25/2014 0752   GLUCOSE 109 (H) 09/26/2018 0447   GLUCOSE 90 02/25/2014 0752   BUN 25 (H) 09/26/2018 0447   BUN 11 02/25/2014 0752   CREATININE 1.52 (H) 09/26/2018 0447   CREATININE 1.22 02/25/2014 0752   CALCIUM 8.3 (L) 09/26/2018 0447   CALCIUM 7.7 (L) 02/25/2014 0752   PROT 7.6 09/22/2018 1134   PROT 7.0 02/21/2014 2153   ALBUMIN 3.3 (L) 09/22/2018 1134     ALBUMIN 2.1 (L) 02/23/2014 0948   AST 19 09/22/2018 1134   AST 21 02/21/2014 2153   ALT 12 09/22/2018 1134   ALT 14 02/21/2014 2153   ALKPHOS 66 09/22/2018 1134   ALKPHOS 63 02/21/2014 2153   BILITOT 0.7 09/22/2018 1134   BILITOT 0.5 02/21/2014 2153   GFRNONAA 35 (L) 09/26/2018 0447   GFRNONAA 47 (L) 02/25/2014 0752   GFRAA 41 (L) 09/26/2018 0447   GFRAA 57 (L) 02/25/2014 0752   Lab Results  Component Value Date   WBC 4.2 09/26/2018   HGB 9.3 (L) 09/27/2018   HCT 30.4 (L) 09/27/2018   MCV 77.9 (L) 09/26/2018   PLT 157 09/26/2018    Imaging Studies: Ct Abdomen Pelvis Wo Contrast  Result Date: 09/26/2018 CLINICAL DATA:  68 year old female with history of renal transplant presenting with right red blood per rectum. EXAM: CT ABDOMEN AND PELVIS WITHOUT CONTRAST TECHNIQUE: Multidetector CT imaging of the abdomen and pelvis was performed following the standard protocol without IV contrast. COMPARISON:  CT of the abdomen pelvis dated 08/12/2018. FINDINGS: Evaluation of this exam is limited in the absence of intravenous contrast. Lower chest: Probable trace bilateral pleural effusions versus pleural thickening. The visualized lung bases are otherwise clear. There is mild cardiomegaly and coronary vascular calcification. There is hypoattenuation of the cardiac blood pool suggestive of a degree of anemia. Clinical correlation is recommended. There is partially visualized pericardial effusion relatively similar to prior CT measuring up to 2.2 cm in thickness. No intra-abdominal free air or free fluid. Hepatobiliary: The liver is unremarkable. Cholecystectomy. No retained calcified stone noted in the central CBD. Pancreas: Unremarkable. No pancreatic ductal dilatation or surrounding inflammatory changes. Spleen: Normal in size without focal abnormality. Adrenals/Urinary Tract: The adrenal glands are unremarkable. Atrophic native right kidney. Postsurgical changes of left nephrectomy. There is a right  lower quadrant transplant kidney. There is no hydronephrosis or nephrolithiasis of the transplant kidney. No peritransplant fluid collection. The urinary bladder is partially distended. There is slight haziness of the bladder wall. Correlation with urinalysis recommended to exclude cystitis. Stomach/Bowel: Mild thickened appearance of the distal colon concerning for mild colitis. Several small scattered sigmoid diverticula without active inflammatory changes noted. There is no bowel obstruction. The appendix is not visualized with certainty. No inflammatory changes identified in the right lower quadrant. Vascular/Lymphatic: Mild aortoiliac atherosclerotic disease. No portal venous gas. There is no adenopathy. Reproductive: Hysterectomy. No pelvic mass. Other: Midline vertical anterior pelvic wall incisional scar. A ventral hernia repair mesh noted. Musculoskeletal: Osteopenia. No acute osseous pathology. Bilateral femoral head avascular necrosis, right greater left. No cortical collapse or fracture. IMPRESSION:  1. Slight thickened appearance of the distal colon concerning for mild colitis. No bowel obstruction. 2. Small sigmoid diverticula without active inflammatory changes. 3. Mild haziness of the bladder wall. Correlation with urinalysis recommended to exclude cystitis. 4. Right lower quadrant transplant kidney. No hydronephrosis or nephrolithiasis. 5. Bilateral femoral head avascular necrosis, right greater left. No cortical collapse or fracture. 6. Aortic Atherosclerosis (ICD10-I70.0). Electronically Signed   By: Anner Crete M.D.   On: 09/26/2018 02:19    Assessment and Plan:   Vara Mairena is a 68 y.o. y/o female here to follow-up for C. difficile diarrhea.  She has a history of dementia, renal cell transplant.  Admitted for UTI and developed C. difficile diarrhea secondary to antibiotic use.  Recently discharged on a course of antibiotics of vancomycin for 10 days.  When seen by her primary care  doctor she continued to have diarrhea and hence the vancomycin was extended for a further 10 days.   Unfortunately she is not feeling any better and still continues to have diarrhea.  I spoke with the doctor and said we will change her to Dificid to see if it makes it any better.    I discussed the assessment and treatment plan with the patient. The patient was provided an opportunity to ask questions and all were answered. The patient agreed with the plan and demonstrated an understanding of the instructions.   The patient was advised to call back or seek an in-person evaluation if the symptoms worsen or if the condition fails to improve as anticipated.  I provided 8 minutes of non-face-to-face time during this encounter.  Dr Sheri Bellows MD,MRCP [facility]) Gastroenterology/Hepatology Pager: [phone]   Speech recognition software was used to dictate this note.

## 2018-10-26 ENCOUNTER — Other Ambulatory Visit: Payer: Self-pay

## 2018-10-26 MED ORDER — DIFICID 200 MG PO TABS: 200.0000 mg | ORAL_TABLET | Freq: Two times a day (BID) | ORAL | 0 refills | Status: AC

## 2018-11-01 ENCOUNTER — Ambulatory Visit (INDEPENDENT_AMBULATORY_CARE_PROVIDER_SITE_OTHER): Payer: Medicare Other | Admitting: Gastroenterology

## 2018-11-01 DIAGNOSIS — A0472 Enterocolitis due to Clostridium difficile, not specified as recurrent: Secondary | ICD-10-CM | POA: Diagnosis not present

## 2018-11-01 NOTE — Progress Notes (Signed)
Jonathon Bellows , MD [address]  [place]  [place], [place] [address]  Main: [phone]  Fax: [phone]   Primary Care Physician: Baxter Hire, MD  Virtual Visit via Telephone Note  I connected with patient on 11/01/18 at  1:30 PM EDT by telephone and verified that I am speaking with the correct person using two identifiers.   I discussed the limitations, risks, security and privacy concerns of performing an evaluation and management service by telephone and the availability of in person appointments. I also discussed with the patient that there may be a patient responsible charge related to this service. The patient expressed understanding and agreed to proceed.  Location of Patient: Home Location of Provider: Home Persons involved: Patient and provider only   History of Present Illness: C. difficile diarrhea  HPI: Keyry Iracheta is a 68 y.o. female  Summary of history : I was consulted to see the patient on 10/15/2018.  Referred for C. difficile diarrhea.She has a history of prior renal transplant. History of dementia. Recent episodes of UTI. Recent admission in 10/04/2018 for UTI and C. difficile diarrhea. Finished course of vancomycin on 10/07/2018.She was given subsequently a second course of vancomycin by Dr. Edwina Barth as the diarrhea persisted.  On 10/25/2018 I spoke with her daughter and she was still having diarrhea and has not had no benefit from vancomycin.  Commenced her on Dificid  Interval history  10/25/2018 -11/01/2018  Spoke with the daughter and says mother is doing well. Diarrhea has resolved, no new issues    Current Outpatient Medications  Medication Sig Dispense Refill  . atorvastatin (LIPITOR) 40 MG tablet Take 40 mg by mouth daily.     . calcitRIOL (ROCALTROL) 0.25 MCG capsule Take 0.25 mcg by mouth daily.    . calcium elemental as carbonate (TUMS ULTRA 1000) 400 MG chewable tablet Chew 1,000 mg by mouth daily as needed for heartburn.     . cholecalciferol (VITAMIN D3) 25 MCG (1000 UT) tablet Take 1,000 Units by mouth daily.    . citalopram (CELEXA) 20 MG tablet Take 20 mg by mouth daily.    Marland Kitchen donepezil (ARICEPT) 5 MG tablet Take 5 mg by mouth at bedtime.     . feeding supplement, ENSURE ENLIVE, (ENSURE ENLIVE) LIQD Take 237 mLs by mouth 2 (two) times daily between meals. 237 mL 12  . fidaxomicin (DIFICID) 200 MG TABS tablet Take 1 tablet (200 mg total) by mouth 2 (two) times daily for 14 days. 28 tablet 0  . folic acid (FOLVITE) 1 MG tablet Take 1 mg by mouth 2 (two) times daily.     . furosemide (LASIX) 20 MG tablet Take 1 tablet (20 mg total) by mouth daily. 30 tablet 0  . Melatonin 10 MG TABS Take 20 mg by mouth at bedtime.    . meloxicam (MOBIC) 15 MG tablet Take 15 mg by mouth daily.    . metoprolol tartrate (LOPRESSOR) 25 MG tablet Take 25 mg by mouth 2 (two) times daily.    . mycophenolate (MYFORTIC) 180 MG EC tablet Take 180 mg by mouth 2 (two) times daily.    . potassium chloride (K-DUR) 10 MEQ tablet Take 2 tablets (20 mEq total) by mouth daily. ONLY WHILE TAKING LASIX 30 tablet 0  . sirolimus (RAPAMUNE) 1 MG tablet Take 3 mg by mouth daily.     . traZODone (DESYREL) 50 MG tablet Take by mouth.     No current facility-administered medications for this  visit.     Allergies as of 11/01/2018 - Review Complete 10/15/2018  Allergen Reaction Noted  . Ace inhibitors Cough 06/30/2014  . Lisinopril Cough 07/06/2014    Review of Systems:    All systems reviewed and negative except where noted in HPI.   Observations/Objective:  Labs: CMP     Component Value Date/Time   NA 139 09/26/2018 0447   NA 142 02/25/2014 0752   K 3.6 09/26/2018 0447   K 3.3 (L) 02/25/2014 0752   CL 113 (H) 09/26/2018 0447   CL 110 (H) 02/25/2014 0752   CO2 22 09/26/2018 0447   CO2 24 02/25/2014 0752   GLUCOSE 109 (H) 09/26/2018 0447   GLUCOSE 90 02/25/2014 0752   BUN 25 (H) 09/26/2018 0447   BUN 11 02/25/2014 0752   CREATININE  1.52 (H) 09/26/2018 0447   CREATININE 1.22 02/25/2014 0752   CALCIUM 8.3 (L) 09/26/2018 0447   CALCIUM 7.7 (L) 02/25/2014 0752   PROT 7.6 09/22/2018 1134   PROT 7.0 02/21/2014 2153   ALBUMIN 3.3 (L) 09/22/2018 1134   ALBUMIN 2.1 (L) 02/23/2014 0948   AST 19 09/22/2018 1134   AST 21 02/21/2014 2153   ALT 12 09/22/2018 1134   ALT 14 02/21/2014 2153   ALKPHOS 66 09/22/2018 1134   ALKPHOS 63 02/21/2014 2153   BILITOT 0.7 09/22/2018 1134   BILITOT 0.5 02/21/2014 2153   GFRNONAA 35 (L) 09/26/2018 0447   GFRNONAA 47 (L) 02/25/2014 0752   GFRAA 41 (L) 09/26/2018 0447   GFRAA 57 (L) 02/25/2014 0752   Lab Results  Component Value Date   WBC 4.2 09/26/2018   HGB 9.3 (L) 09/27/2018   HCT 30.4 (L) 09/27/2018   MCV 77.9 (L) 09/26/2018   PLT 157 09/26/2018    Imaging Studies: No results found.  Assessment and Plan:   Lyrah Bradt is a 68 y.o. y/o female  here to follow-up for C. difficile diarrhea.  She has a history of dementia, renal cell transplant. Admitted for UTI and developed C. difficile diarrhea secondary to antibiotic use. Recently discharged on a course of antibiotics of vancomycin for 10 days. When seen by her primary care doctor she continued to have diarrhea and hence the vancomycin was extended for a further 10 days. On 10/25/2018 when I spoke to her daughter.  She has was not feeling any better from the vancomycin and continued to have severe diarrhea.  I commenced her on Dificid with resolution of diarrhea     I discussed the assessment and treatment plan with the patient. The patient was provided an opportunity to ask questions and all were answered. The patient agreed with the plan and demonstrated an understanding of the instructions.   The patient was advised to call back or seek an in-person evaluation if the symptoms worsen or if the condition fails to improve as anticipated.  I provided 7 minutes of non-face-to-face time during this encounter.  Dr Jonathon Bellows MD,MRCP [facility]) Gastroenterology/Hepatology Pager: [phone]   Speech recognition software was used to dictate this note.

## 2018-11-07 ENCOUNTER — Observation Stay
Admission: EM | Admit: 2018-11-07 | Discharge: 2018-11-09 | Disposition: A | Discharge status: Home / Self Care | Payer: Medicare Other | Attending: Internal Medicine | Admitting: Internal Medicine

## 2018-11-07 ENCOUNTER — Other Ambulatory Visit: Payer: Self-pay

## 2018-11-07 DIAGNOSIS — L89312 Pressure ulcer of right buttock, stage 2: Secondary | ICD-10-CM | POA: Insufficient documentation

## 2018-11-07 DIAGNOSIS — N183 Chronic kidney disease, stage 3 (moderate): Secondary | ICD-10-CM | POA: Insufficient documentation

## 2018-11-07 DIAGNOSIS — N390 Urinary tract infection, site not specified: Secondary | ICD-10-CM | POA: Insufficient documentation

## 2018-11-07 DIAGNOSIS — Z791 Long term (current) use of non-steroidal anti-inflammatories (NSAID): Secondary | ICD-10-CM | POA: Diagnosis not present

## 2018-11-07 DIAGNOSIS — L89323 Pressure ulcer of left buttock, stage 3: Secondary | ICD-10-CM | POA: Insufficient documentation

## 2018-11-07 DIAGNOSIS — F0390 Unspecified dementia without behavioral disturbance: Secondary | ICD-10-CM | POA: Diagnosis not present

## 2018-11-07 DIAGNOSIS — E860 Dehydration: Principal | ICD-10-CM | POA: Insufficient documentation

## 2018-11-07 DIAGNOSIS — K219 Gastro-esophageal reflux disease without esophagitis: Secondary | ICD-10-CM | POA: Diagnosis not present

## 2018-11-07 DIAGNOSIS — Z85528 Personal history of other malignant neoplasm of kidney: Secondary | ICD-10-CM | POA: Diagnosis not present

## 2018-11-07 DIAGNOSIS — Z20828 Contact with and (suspected) exposure to other viral communicable diseases: Secondary | ICD-10-CM | POA: Insufficient documentation

## 2018-11-07 DIAGNOSIS — Z940 Kidney transplant status: Secondary | ICD-10-CM | POA: Insufficient documentation

## 2018-11-07 DIAGNOSIS — Z79899 Other long term (current) drug therapy: Secondary | ICD-10-CM | POA: Insufficient documentation

## 2018-11-07 DIAGNOSIS — Z23 Encounter for immunization: Secondary | ICD-10-CM | POA: Diagnosis not present

## 2018-11-07 DIAGNOSIS — E785 Hyperlipidemia, unspecified: Secondary | ICD-10-CM | POA: Insufficient documentation

## 2018-11-07 DIAGNOSIS — I129 Hypertensive chronic kidney disease with stage 1 through stage 4 chronic kidney disease, or unspecified chronic kidney disease: Secondary | ICD-10-CM | POA: Diagnosis not present

## 2018-11-07 DIAGNOSIS — Z8542 Personal history of malignant neoplasm of other parts of uterus: Secondary | ICD-10-CM | POA: Diagnosis not present

## 2018-11-07 DIAGNOSIS — A0472 Enterocolitis due to Clostridium difficile, not specified as recurrent: Secondary | ICD-10-CM | POA: Insufficient documentation

## 2018-11-07 HISTORY — DX: Enterocolitis due to Clostridium difficile, not specified as recurrent: A04.72

## 2018-11-07 LAB — COMPREHENSIVE METABOLIC PANEL
ALT: 12 U/L (ref 0–44)
AST: 16 U/L (ref 15–41)
Albumin: 3.3 g/dL — ABNORMAL LOW (ref 3.5–5.0)
Alkaline Phosphatase: 43 U/L (ref 38–126)
Anion gap: 10 (ref 5–15)
BUN: 41 mg/dL — ABNORMAL HIGH (ref 8–23)
CO2: 24 mmol/L (ref 22–32)
Calcium: 8.8 mg/dL — ABNORMAL LOW (ref 8.9–10.3)
Chloride: 105 mmol/L (ref 98–111)
Creatinine, Ser: 2.03 mg/dL — ABNORMAL HIGH (ref 0.44–1.00)
GFR calc Af Amer: 28 mL/min — ABNORMAL LOW (ref >60)
GFR calc non Af Amer: 25 mL/min — ABNORMAL LOW (ref >60)
Glucose, Bld: 131 mg/dL — ABNORMAL HIGH (ref 70–99)
Potassium: 3.7 mmol/L (ref 3.5–5.1)
Sodium: 139 mmol/L (ref 135–145)
Total Bilirubin: 0.6 mg/dL (ref 0.3–1.2)
Total Protein: 6.6 g/dL (ref 6.5–8.1)

## 2018-11-07 LAB — URINALYSIS, COMPLETE (UACMP) WITH MICROSCOPIC
Bacteria, UA: NONE SEEN
Bilirubin Urine: NEGATIVE
Glucose, UA: NEGATIVE mg/dL
Ketones, ur: NEGATIVE mg/dL
Nitrite: NEGATIVE
Protein, ur: NEGATIVE mg/dL
RBC / HPF: >50 RBC/hpf — ABNORMAL HIGH (ref 0–5)
Specific Gravity, Urine: 1.009 (ref 1.005–1.030)
WBC, UA: >50 WBC/hpf — ABNORMAL HIGH (ref 0–5)
pH: 5 (ref 5.0–8.0)

## 2018-11-07 LAB — CBC
HCT: 28.5 % — ABNORMAL LOW (ref 36.0–46.0)
Hemoglobin: 8.6 g/dL — ABNORMAL LOW (ref 12.0–15.0)
MCH: 24.5 pg — ABNORMAL LOW (ref 26.0–34.0)
MCHC: 30.2 g/dL (ref 30.0–36.0)
MCV: 81.2 fL (ref 80.0–100.0)
Platelets: 188 10*3/uL (ref 150–400)
RBC: 3.51 MIL/uL — ABNORMAL LOW (ref 3.87–5.11)
RDW: 17.5 % — ABNORMAL HIGH (ref 11.5–15.5)
WBC: 3.7 10*3/uL — ABNORMAL LOW (ref 4.0–10.5)
nRBC: 0 % (ref 0.0–0.2)

## 2018-11-07 MED ORDER — MELATONIN 5 MG PO TABS
20.0000 mg | ORAL_TABLET | Freq: Every day | ORAL | Status: DC
  Administered 2018-11-07 – 2018-11-08 (×2): 20 mg via ORAL
  Filled 2018-11-07 (×3): qty 4

## 2018-11-07 MED ORDER — CALCITRIOL 0.25 MCG PO CAPS
0.2500 ug | ORAL_CAPSULE | Freq: Every day | ORAL | Status: DC
  Administered 2018-11-08 – 2018-11-09 (×2): 0.25 ug via ORAL
  Filled 2018-11-07 (×2): qty 1

## 2018-11-07 MED ORDER — POTASSIUM CHLORIDE CRYS ER 20 MEQ PO TBCR
20.0000 meq | EXTENDED_RELEASE_TABLET | Freq: Two times a day (BID) | ORAL | Status: DC
  Administered 2018-11-07 – 2018-11-09 (×4): 20 meq via ORAL
  Filled 2018-11-07 (×4): qty 1

## 2018-11-07 MED ORDER — FOLIC ACID 1 MG PO TABS
1.0000 mg | ORAL_TABLET | Freq: Two times a day (BID) | ORAL | Status: DC
  Administered 2018-11-07 – 2018-11-09 (×4): 1 mg via ORAL
  Filled 2018-11-07 (×4): qty 1

## 2018-11-07 MED ORDER — DONEPEZIL HCL 5 MG PO TABS
5.0000 mg | ORAL_TABLET | Freq: Every day | ORAL | Status: DC
  Administered 2018-11-07 – 2018-11-08 (×2): 5 mg via ORAL
  Filled 2018-11-07 (×3): qty 1

## 2018-11-07 MED ORDER — TRAZODONE HCL 50 MG PO TABS
50.0000 mg | ORAL_TABLET | Freq: Every day | ORAL | Status: DC
  Administered 2018-11-07 – 2018-11-08 (×2): 50 mg via ORAL
  Filled 2018-11-07 (×2): qty 1

## 2018-11-07 MED ORDER — SODIUM CHLORIDE 0.9 % IV SOLN
INTRAVENOUS | Status: DC
  Administered 2018-11-07 – 2018-11-08 (×3): via INTRAVENOUS

## 2018-11-07 MED ORDER — SODIUM CHLORIDE 0.9 % IV SOLN
1.0000 g | Freq: Once | INTRAVENOUS | Status: AC
  Administered 2018-11-07: 16:00:00 1 g via INTRAVENOUS
  Filled 2018-11-07: qty 10

## 2018-11-07 MED ORDER — CITALOPRAM HYDROBROMIDE 20 MG PO TABS
20.0000 mg | ORAL_TABLET | Freq: Every day | ORAL | Status: DC
  Administered 2018-11-08 – 2018-11-09 (×2): 20 mg via ORAL
  Filled 2018-11-07 (×2): qty 1

## 2018-11-07 MED ORDER — MYCOPHENOLATE SODIUM 180 MG PO TBEC
180.0000 mg | DELAYED_RELEASE_TABLET | Freq: Two times a day (BID) | ORAL | Status: DC
  Administered 2018-11-08 – 2018-11-09 (×4): 180 mg via ORAL
  Filled 2018-11-07 (×6): qty 1

## 2018-11-07 MED ORDER — INFLUENZA VAC A&B SA ADJ QUAD 0.5 ML IM PRSY
0.5000 mL | PREFILLED_SYRINGE | INTRAMUSCULAR | Status: AC
  Administered 2018-11-08: 0.5 mL via INTRAMUSCULAR
  Filled 2018-11-07: qty 0.5

## 2018-11-07 MED ORDER — HEPARIN SODIUM (PORCINE) 5000 UNIT/ML IJ SOLN
5000.0000 [IU] | Freq: Three times a day (TID) | INTRAMUSCULAR | Status: DC
  Administered 2018-11-07 – 2018-11-09 (×5): 5000 [IU] via SUBCUTANEOUS
  Filled 2018-11-07 (×5): qty 1

## 2018-11-07 MED ORDER — METOPROLOL TARTRATE 25 MG PO TABS
25.0000 mg | ORAL_TABLET | Freq: Two times a day (BID) | ORAL | Status: DC
  Administered 2018-11-07 – 2018-11-09 (×4): 25 mg via ORAL
  Filled 2018-11-07 (×4): qty 1

## 2018-11-07 MED ORDER — SODIUM CHLORIDE 0.9 % IV SOLN: INTRAVENOUS | Status: DC

## 2018-11-07 MED ORDER — FUROSEMIDE 20 MG PO TABS
20.0000 mg | ORAL_TABLET | Freq: Two times a day (BID) | ORAL | Status: DC
  Administered 2018-11-08 – 2018-11-09 (×3): 20 mg via ORAL
  Filled 2018-11-07 (×3): qty 1

## 2018-11-07 MED ORDER — MELOXICAM 7.5 MG PO TABS
15.0000 mg | ORAL_TABLET | Freq: Every day | ORAL | Status: DC
  Administered 2018-11-08 – 2018-11-09 (×2): 15 mg via ORAL
  Filled 2018-11-07 (×2): qty 2

## 2018-11-07 MED ORDER — ATORVASTATIN CALCIUM 20 MG PO TABS
40.0000 mg | ORAL_TABLET | Freq: Every day | ORAL | Status: DC
  Administered 2018-11-08 – 2018-11-09 (×2): 40 mg via ORAL
  Filled 2018-11-07 (×2): qty 2

## 2018-11-07 MED ORDER — DOCUSATE SODIUM 100 MG PO CAPS: 100.0000 mg | ORAL_CAPSULE | Freq: Two times a day (BID) | ORAL | Status: DC | PRN

## 2018-11-07 MED ORDER — FIDAXOMICIN 200 MG PO TABS
200.0000 mg | ORAL_TABLET | Freq: Two times a day (BID) | ORAL | Status: DC
  Administered 2018-11-07 – 2018-11-09 (×4): 200 mg via ORAL
  Filled 2018-11-07 (×5): qty 1

## 2018-11-07 MED ORDER — VITAMIN D 25 MCG (1000 UNIT) PO TABS
1000.0000 [IU] | ORAL_TABLET | Freq: Every day | ORAL | Status: DC
  Administered 2018-11-08 – 2018-11-09 (×2): 1000 [IU] via ORAL
  Filled 2018-11-07 (×2): qty 1

## 2018-11-07 NOTE — ED Provider Notes (Signed)
[facility] Emergency Department Provider Note   ____________________________________________    I have reviewed the triage vital signs and the nursing notes.   HISTORY  Chief Complaint Possible urinary tract infection  History significantly limited by dementia    HPI [name] is a 68 y.o. female presents via EMS from home complaining of possible urinary tract infection symptoms, she does have a history of a kidney transplant and is immunosuppressed.  Also reportedly has a decubitus ulcer on her buttocks, review of medical record demonstrates recent treatment for C. difficile, unclear if the symptoms of cleared at this time   Past Medical History:  Diagnosis Date  . Anemia   . Cancer of kidney ([place])   . Dementia ([place])   . GERD (gastroesophageal reflux disease)   . Hypertension   . Renal disorder   . Uterine cancer [facility])     Patient Active Problem List   Diagnosis Date Noted  . Pressure injury of skin 09/23/2018  . Acute encephalopathy 09/22/2018  . Renal failure (ARF), acute on chronic ([place]) 08/12/2018  . Confusion 03/23/2018  . Hypokalemia 06/01/2017  . Sepsis ([place]) 06/29/2016  . UTI (urinary tract infection) 06/29/2016  . GERD (gastroesophageal reflux disease) 06/29/2016  . Dementia ([place]) 06/29/2016  . 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 Essential hypertension 07/06/2014  . H/O kidney transplant 07/06/2014    Past Surgical History:  Procedure Laterality Date  . ABDOMINAL HYSTERECTOMY    . AV FISTULA PLACEMENT    . BREAST BIOPSY     Patient is unsure but she thinks she remember a Bx  . BREAST BIOPSY Right 2017   benign  . KIDNEY TRANSPLANT    . NEPHRECTOMY TRANSPLANTED ORGAN      Prior to Admission medications   Medication Sig Start Date End Date Taking? Authorizing Provider  atorvastatin (LIPITOR) 40 MG tablet Take 40 mg by mouth daily.     [provider]  calcitRIOL (ROCALTROL) 0.25 MCG capsule Take  0.25 mcg by mouth daily.    [provider]  calcium elemental as carbonate (TUMS ULTRA 1000) 400 MG chewable tablet Chew 1,000 mg by mouth daily as needed for heartburn.    [provider]  cholecalciferol (VITAMIN D3) 25 MCG (1000 UT) tablet Take 1,000 Units by mouth daily.    [provider]  citalopram (CELEXA) 20 MG tablet Take 20 mg by mouth daily.    [provider]  donepezil (ARICEPT) 5 MG tablet Take 5 mg by mouth at bedtime.     [provider]  feeding supplement, ENSURE ENLIVE, (ENSURE ENLIVE) LIQD Take 237 mLs by mouth 2 (two) times daily between meals. 09/27/18   Saundra Shelling, MD  fidaxomicin (DIFICID) 200 MG TABS tablet Take 1 tablet (200 mg total) by mouth 2 (two) times daily for 14 days. 10/26/18 11/09/18  Jonathon Bellows, MD  folic acid (FOLVITE) 1 MG tablet Take 1 mg by mouth 2 (two) times daily.     [provider]  furosemide (LASIX) 20 MG tablet Take 1 tablet (20 mg total) by mouth daily. 08/17/18   Gladstone Lighter, MD  Melatonin 10 MG TABS Take 20 mg by mouth at bedtime.    [provider]  meloxicam (MOBIC) 15 MG tablet Take 15 mg by mouth daily. 06/14/18   [provider]  metoprolol tartrate (LOPRESSOR) 25 MG tablet Take 25 mg by mouth 2 (two) times daily.    [provider]  mycophenolate (MYFORTIC)  180 MG EC tablet Take 180 mg by mouth 2 (two) times daily.    [provider]  potassium chloride (K-DUR) 10 MEQ tablet Take 2 tablets (20 mEq total) by mouth daily. ONLY WHILE TAKING LASIX 08/17/18   Gladstone Lighter, MD  sirolimus (RAPAMUNE) 1 MG tablet Take 3 mg by mouth daily.     [provider]  traZODone (DESYREL) 50 MG tablet Take by mouth. 10/11/18 10/11/19  [provider]     Allergies Ace inhibitors and Lisinopril  Family History  Problem Relation Age of Onset  . Stroke Mother   . Alcohol abuse Father   . Breast cancer Neg Hx     Social History Social  History   Tobacco Use  . Smoking status: Former Research scientist (life sciences)  . Smokeless tobacco: Never Used  Substance Use Topics  . Alcohol use: No  . Drug use: No    Review of Systems  Constitutional: No fever/chills Eyes: No visual changes.  ENT: No sore throat. Cardiovascular: Denies chest pain. Respiratory: Denies shortness of breath. Gastrointestinal: Denies abdominal pain Genitourinary: Thinks she is having dysuria Musculoskeletal: Negative for back pain. Skin: Reports of ulcer Neurological: Negative for headaches o   ____________________________________________   PHYSICAL EXAM:  VITAL SIGNS: ED Triage Vitals  Enc Vitals Group     BP 11/07/18 1222 129/62     Pulse Rate 11/07/18 1222 61     Resp 11/07/18 1222 12     Temp 11/07/18 1222 97.9 F (36.6 C)     Temp Source 11/07/18 1222 Oral     SpO2 11/07/18 1222 100 %     Weight 11/07/18 1238 95.7 kg (211 lb)     Height 11/07/18 1238 1.499 m (4\' 11" )     Head Circumference --      Peak Flow --      Pain Score 11/07/18 1238 0     Pain Loc --      Pain Edu? --      Excl. in [place]? --     Constitutional: Alert but disoriented no acute distress.   Nose: No congestion/rhinnorhea. Mouth/Throat: Mucous membranes are moist.   Cardiovascular: Normal rate, regular rhythm. Grossly normal heart sounds.  Good peripheral circulation. Respiratory: Normal respiratory effort.  No retractions. Lungs CTAB. Gastrointestinal: Soft and nontender. No distention.  No CVA tenderness.  Musculoskeletal: No lower extremity tenderness nor edema.  Warm and well perfused Neurologic:  Normal speech and language. No gross focal neurologic deficits are appreciated.  Skin:  Skin is warm, dry.  Small ulceration left buttock, no evidence of infection Psychiatric: Mood and affect are normal. Speech and behavior are normal.  ____________________________________________   LABS (all labs ordered are listed, but only abnormal results are displayed)  Labs  Reviewed  CBC - Abnormal; Notable for the following components:      Result Value   WBC 3.7 (*)    RBC 3.51 (*)    Hemoglobin 8.6 (*)    HCT 28.5 (*)    MCH 24.5 (*)    RDW 17.5 (*)    All other components within normal limits  COMPREHENSIVE METABOLIC PANEL - Abnormal; Notable for the following components:   Glucose, Bld 131 (*)    BUN 41 (*)    Creatinine, Ser 2.03 (*)    Calcium 8.8 (*)    Albumin 3.3 (*)    GFR calc non Af Amer 25 (*)    GFR calc Af Amer 28 (*)    All  other components within normal limits  URINALYSIS, COMPLETE (UACMP) WITH MICROSCOPIC - Abnormal; Notable for the following components:   Color, Urine YELLOW (*)    APPearance CLOUDY (*)    Hgb urine dipstick LARGE (*)    Leukocytes,Ua LARGE (*)    RBC / HPF >50 (*)    WBC, UA >50 (*)    All other components within normal limits  URINE CULTURE  SARS CORONAVIRUS 2 (TAT 6-24 HRS)   ____________________________________________  EKG  None ____________________________________________  RADIOLOGY  None ____________________________________________   PROCEDURES  Procedure(s) performed: No  Procedures   Critical Care performed: No ____________________________________________   INITIAL IMPRESSION / ASSESSMENT AND PLAN / ED COURSE  Pertinent labs & imaging results that were available during my care of the patient were reviewed by me and considered in my medical decision making (see chart for details).  Patient with mild elevation in BUN and creatinine from her baseline of 1.5, she is immunosuppressed with renal transplant.  Urinalysis consistent with UTI, urine culture sent, will treat with Rocephin and admit to the hospitalist service for further treatment    ____________________________________________   FINAL CLINICAL IMPRESSION(S) / ED DIAGNOSES  Final diagnoses:  Lower urinary tract infectious disease  Dehydration  Renal transplant recipient        Note:  This document was prepared  using Dragon voice recognition software and may include unintentional dictation errors.   Lavonia Drafts, MD 11/07/18 [phone]

## 2018-11-07 NOTE — ED Notes (Signed)
Floor RN called stating waiting on room to be cleaned will call back once cleaned.

## 2018-11-07 NOTE — H&P (Signed)
[facility] [place] NAME: [name]    MR#:  [id number]  DATE OF BIRTH:  May 11, 1950  DATE OF ADMISSION:  11/07/2018  PRIMARY CARE PHYSICIAN: Baxter Hire, MD   REQUESTING/REFERRING PHYSICIAN: Chrissie Noa  CHIEF COMPLAINT:   Chief Complaint  Patient presents with  . Pelvic Pain    HISTORY OF PRESENT ILLNESS: Aleyna Cueva  is a 68 y.o. female with a known history of C. difficile colitis, kidney cancer, dementia, hypertension, uterine cancer, status post kidney transplant-lives with her daughter due to terminal dementia.  She was admitted to hospital last month for UTI and followed by C. Difficile.  She was discharged home with oral vancomycin.  Diarrhea continued after 10 days of oral vancomycin treatment so primary care physician sent her to gastroenterologist who started on Dificid.  She has good response and she was supposed to continue that until 25 September. As per daughter patient was complaining of some itching and pain in her vaginal area and her diapers were smelling very bad for last 2 to 3 days so she thought this is a UTI and sent the patient back to emergency room.  Daughter denied to me any fever or chills. ER physician called me for admitting for UTI.  They gave 1 dose of IV Rocephin and sent for urine culture.  PAST MEDICAL HISTORY:   Past Medical History:  Diagnosis Date  . Anemia   . C. difficile colitis   . Cancer of kidney ([place])   . Dementia ([place])   . GERD (gastroesophageal reflux disease)   . Hypertension   . Renal disorder   . Uterine cancer ([place])     PAST SURGICAL HISTORY:  Past Surgical History:  Procedure Laterality Date  . ABDOMINAL HYSTERECTOMY    . AV FISTULA PLACEMENT    . BREAST BIOPSY     Patient is unsure but she thinks she remember a Bx  . BREAST BIOPSY Right 2017   benign  . KIDNEY TRANSPLANT    . NEPHRECTOMY TRANSPLANTED ORGAN      SOCIAL HISTORY:  Social History   Tobacco Use  .  Smoking status: Former Research scientist (life sciences)  . Smokeless tobacco: Never Used  Substance Use Topics  . Alcohol use: No    FAMILY HISTORY:  Family History  Problem Relation Age of Onset  . Stroke Mother   . Alcohol abuse Father   . Breast cancer Neg Hx     DRUG ALLERGIES:  Allergies  Allergen Reactions  . Ace Inhibitors Cough  . Lisinopril Cough    REVIEW OF SYSTEMS:   Patient has terminal dementia and she cannot give me any review of system or details. She denies any complaints to me though.  MEDICATIONS AT HOME:  Prior to Admission medications   Medication Sig Start Date End Date Taking? Authorizing Provider  atorvastatin (LIPITOR) 40 MG tablet Take 40 mg by mouth daily.    Yes [provider]  calcitRIOL (ROCALTROL) 0.25 MCG capsule Take 0.25 mcg by mouth daily.   Yes [provider]  calcium elemental as carbonate (TUMS ULTRA 1000) 400 MG chewable tablet Chew 1,000 mg by mouth daily as needed for heartburn.   Yes [provider]  cholecalciferol (VITAMIN D3) 25 MCG (1000 UT) tablet Take 1,000 Units by mouth daily.   Yes [provider]  citalopram (CELEXA) 20 MG tablet Take 20 mg by mouth daily.   Yes [provider]  donepezil (ARICEPT) 5 MG  tablet Take 5 mg by mouth at bedtime.    Yes [provider]  feeding supplement, ENSURE ENLIVE, (ENSURE ENLIVE) LIQD Take 237 mLs by mouth 2 (two) times daily between meals. 09/27/18  Yes Pyreddy, Reatha Harps, MD  fidaxomicin (DIFICID) 200 MG TABS tablet Take 1 tablet (200 mg total) by mouth 2 (two) times daily for 14 days. 10/26/18 11/09/18 Yes Jonathon Bellows, MD  folic acid (FOLVITE) 1 MG tablet Take 1 mg by mouth 2 (two) times daily.    Yes [provider]  furosemide (LASIX) 20 MG tablet Take 1 tablet (20 mg total) by mouth daily. Patient taking differently: Take 20 mg by mouth 2 (two) times daily.  08/17/18  Yes Gladstone Lighter, MD  Melatonin 10 MG TABS Take 20 mg by mouth at bedtime.   Yes  [provider]  meloxicam (MOBIC) 15 MG tablet Take 15 mg by mouth daily. 06/14/18  Yes [provider]  metoprolol tartrate (LOPRESSOR) 25 MG tablet Take 25 mg by mouth 2 (two) times daily.   Yes [provider]  mycophenolate (MYFORTIC) 180 MG EC tablet Take 180 mg by mouth 2 (two) times daily.   Yes [provider]  potassium chloride (K-DUR) 10 MEQ tablet Take 2 tablets (20 mEq total) by mouth daily. ONLY WHILE TAKING LASIX Patient taking differently: Take 20 mEq by mouth 2 (two) times daily.  08/17/18  Yes Gladstone Lighter, MD  sirolimus (RAPAMUNE) 1 MG tablet Take 3 mg by mouth daily.    Yes [provider]  traZODone (DESYREL) 50 MG tablet Take 50 mg by mouth at bedtime.  10/11/18 10/11/19 Yes [provider]      PHYSICAL EXAMINATION:   VITAL SIGNS: Blood pressure (!) 134/95, pulse (!) 57, temperature 97.9 F (36.6 C), temperature source Oral, resp. rate 15, height 4\' 11"  (1.499 m), weight 95.7 kg, SpO2 99 %.  GENERAL:  68 y.o.-year-old patient lying in the bed with no acute distress.  EYES: Pupils equal, round, reactive to light and accommodation. No scleral icterus. Extraocular muscles intact.  HEENT: Head atraumatic, normocephalic. Oropharynx and nasopharynx clear.  NECK:  Supple, no jugular venous distention. No thyroid enlargement, no tenderness.  LUNGS: Normal breath sounds bilaterally, no wheezing, rales,rhonchi or crepitation. No use of accessory muscles of respiration.  CARDIOVASCULAR: S1, S2 normal. No murmurs, rubs, or gallops.  ABDOMEN: Soft, nontender, nondistended. Bowel sounds present. No organomegaly or mass.  EXTREMITIES: No pedal edema, cyanosis, or clubbing.  NEUROLOGIC: Cranial nerves II through XII are intact. Muscle strength 4/5 in all extremities. Sensation intact. Gait not checked.  PSYCHIATRIC: The patient is alert and oriented x 0.  SKIN: No obvious rash, lesion, or ulcer.   LABORATORY PANEL:    CBC Recent Labs  Lab 11/07/18 1223  WBC 3.7*  HGB 8.6*  HCT 28.5*  PLT 188  MCV 81.2  MCH 24.5*  MCHC 30.2  RDW 17.5*   ------------------------------------------------------------------------------------------------------------------  Chemistries  Recent Labs  Lab 11/07/18 1223  NA 139  K 3.7  CL 105  CO2 24  GLUCOSE 131*  BUN 41*  CREATININE 2.03*  CALCIUM 8.8*  AST 16  ALT 12  ALKPHOS 43  BILITOT 0.6   ------------------------------------------------------------------------------------------------------------------ estimated creatinine clearance is 26.9 mL/min (A) (by C-G formula based on SCr of 2.03 mg/dL (H)). ------------------------------------------------------------------------------------------------------------------ No results for input(s): TSH, T4TOTAL, T3FREE, THYROIDAB in the last 72 hours.  Invalid input(s): FREET3   Coagulation profile No results for input(s): INR, PROTIME in the last 168  hours. ------------------------------------------------------------------------------------------------------------------- No results for input(s): DDIMER in the last 72 hours. -------------------------------------------------------------------------------------------------------------------  Cardiac Enzymes No results for input(s): CKMB, TROPONINI, MYOGLOBIN in the last 168 hours.  Invalid input(s): CK ------------------------------------------------------------------------------------------------------------------ Invalid input(s): POCBNP  ---------------------------------------------------------------------------------------------------------------  Urinalysis    Component Value Date/Time   COLORURINE YELLOW (A) 11/07/2018 1507   APPEARANCEUR CLOUDY (A) 11/07/2018 1507   APPEARANCEUR Clear 02/25/2014 1407   LABSPEC 1.009 11/07/2018 1507   LABSPEC 1.009 02/25/2014 1407   PHURINE 5.0 11/07/2018 1507   GLUCOSEU NEGATIVE 11/07/2018 1507   GLUCOSEU  Negative 02/25/2014 1407   HGBUR LARGE (A) 11/07/2018 1507   BILIRUBINUR NEGATIVE 11/07/2018 1507   BILIRUBINUR Negative 02/25/2014 1407   KETONESUR NEGATIVE 11/07/2018 1507   PROTEINUR NEGATIVE 11/07/2018 1507   NITRITE NEGATIVE 11/07/2018 1507   LEUKOCYTESUR LARGE (A) 11/07/2018 1507   LEUKOCYTESUR Negative 02/25/2014 1407     RADIOLOGY: No results found.  EKG: Orders placed or performed during the hospital encounter of 09/22/18  . EKG 12-Lead  . EKG 12-Lead    IMPRESSION AND PLAN:  *UTI? Patient was treated for UTI and had C. difficile last month which she is still getting treatment for. She has more than 50 WBCs in urine all the time for last few months but her nitrite is negative. White blood cell count is not high.  She does not have fever. I would like not to give her antibiotics for now.  She received 1 dose of ceftriaxone by ER. Urine culture is sent. We should make further decisions depending on her urine culture whether to treat this is UTI or not. Over involve infectious disease specialist if needed.  *C. difficile She is currently treated by Dificid, continue for now. Supposed to be treated until 25th September.  *Acute on chronic renal insufficiency stage III Mild worsening of renal function compared to last month, give IV fluid and monitor  *Hypertension Continue home medications.  *Hyperlipidemia Continue atorvastatin.    *Dementia Lives with her daughter, continue Celexa and Aricept.  All the records are reviewed and case discussed with ED provider. Management plans discussed with the patient, family and they are in agreement.  CODE STATUS:Full. Code Status History    Date Active Date Inactive Code Status Order ID Comments User Context   09/22/2018 1852 09/27/2018 1908 Full Code [id number]  Dustin Flock, MD Inpatient   08/12/2018 1749 08/15/2018 1758 Full Code [id number]  Demetrios Loll, MD Inpatient   06/20/2018 0041 06/21/2018 1616 Full Code [id number]   Lance Coon, MD Inpatient   03/23/2018 0357 03/26/2018 1620 Full Code [id number]  Harrie Foreman, MD Inpatient   11/21/2017 0839 11/23/2017 1708 Full Code [id number]  Fritzi Mandes, MD Inpatient   06/01/2017 1402 06/03/2017 1831 Full Code [id number]  Loletha Grayer, MD ED   06/29/2016 2314 07/02/2016 1712 Full Code [id number]  Lance Coon, MD Inpatient   07/06/2014 2201 07/08/2014 1537 Full Code [id number]  Hower, Aaron Mose, MD ED   Advance Care Planning Activity       TOTAL TIME TAKING CARE OF THIS PATIENT: 45 minutes.  Discussed with patient's daughter on phone  Vaughan Basta M.D on 11/07/2018   Between 7am to 6pm - Pager - [phone]  After 6pm go to [email or web address] - password [email or web address] [facility]  Office  [phone]  CC: Primary care physician; Baxter Hire, MD   Note: This dictation was prepared with Dragon dictation along with smaller phrase technology. Any transcriptional errors that result from this process are unintentional.

## 2018-11-07 NOTE — ED Triage Notes (Signed)
Pt arrived from home via [facility] with chief complaint of pelvic pain similar to when she has a UTI.  Pt hx of kidney transplant.  Changes in balance last 3 days.  Pt also says she has a new sacral wound.

## 2018-11-07 NOTE — ED Notes (Signed)
Kim RN, aware of bed assigned  

## 2018-11-07 NOTE — ED Notes (Signed)
ED TO INPATIENT HANDOFF REPORT  ED Nurse Name and Phone #: Joelene Millin [phone]  S Name/Age/Gender [name] 68 y.o. female Room/Bed: ED13A/ED13A  Code Status   Code Status: Prior  Home/SNF/Other Home Patient oriented to: self, place and situation Is this baseline? Yes   Triage Complete: Triage complete  Chief Complaint pelvic pain  Triage Note Pt arrived from home via [facility] with chief complaint of pelvic pain similar to when she has a UTI.  Pt hx of kidney transplant.  Changes in balance last 3 days.  Pt also says she has a new sacral wound.   Allergies Allergies  Allergen Reactions  . Ace Inhibitors Cough  . Lisinopril Cough    Level of Care/Admitting Diagnosis ED Disposition    ED Disposition Condition [facility]: [place] [[id number]]  Level of Care: Med-Surg [16]  Covid Evaluation: Asymptomatic Screening Protocol (No Symptoms)  Diagnosis: UTI (urinary tract infection) [938182]  Admitting Physician: Vaughan Basta [phone]  Attending Physician: Vaughan Basta [phone]  PT Class (Do Not Modify): Observation [104]  PT Acc Code (Do Not Modify): Observation [10022]       B Medical/Surgery History Past Medical History:  Diagnosis Date  . Anemia   . C. difficile colitis   . Cancer of kidney ([place])   . Dementia ([place])   . GERD (gastroesophageal reflux disease)   . Hypertension   . Renal disorder   . Uterine cancer [facility])    Past Surgical History:  Procedure Laterality Date  . ABDOMINAL HYSTERECTOMY    . AV FISTULA PLACEMENT    . BREAST BIOPSY     Patient is unsure but she thinks she remember a Bx  . BREAST BIOPSY Right 2017   benign  . KIDNEY TRANSPLANT    . NEPHRECTOMY TRANSPLANTED ORGAN       A IV Location/Drains/Wounds Patient Lines/Drains/Airways Status   Active Line/Drains/Airways    Name:   Placement date:   Placement time:   Site:   Days:   Peripheral IV 11/07/18 Right  Antecubital   11/07/18    1521    Antecubital   less than 1   Pressure Injury 09/22/18 Buttocks Right Stage II -  Partial thickness loss of dermis presenting as a shallow open ulcer with a red, pink wound bed without slough.   09/22/18    2025     46          Intake/Output Last 24 hours No intake or output data in the 24 hours ending 11/07/18 1937  Labs/Imaging Results for orders placed or performed during the hospital encounter of 11/07/18 (from the past 48 hour(s))  CBC     Status: Abnormal   Collection Time: 11/07/18 12:23 PM  Result Value Ref Range   WBC 3.7 (L) 4.0 - 10.5 K/uL   RBC 3.51 (L) 3.87 - 5.11 MIL/uL   Hemoglobin 8.6 (L) 12.0 - 15.0 g/dL   HCT 28.5 (L) 36.0 - 46.0 %   MCV 81.2 80.0 - 100.0 fL   MCH 24.5 (L) 26.0 - 34.0 pg   MCHC 30.2 30.0 - 36.0 g/dL   RDW 17.5 (H) 11.5 - 15.5 %   Platelets 188 150 - 400 K/uL   nRBC 0.0 0.0 - 0.2 %    Comment: Performed at [facility], [address], [place], [place] [address]  Comprehensive metabolic panel     Status: Abnormal   Collection Time: 11/07/18 12:23 PM  Result Value  Ref Range   Sodium 139 135 - 145 mmol/L   Potassium 3.7 3.5 - 5.1 mmol/L   Chloride 105 98 - 111 mmol/L   CO2 24 22 - 32 mmol/L   Glucose, Bld 131 (H) 70 - 99 mg/dL   BUN 41 (H) 8 - 23 mg/dL   Creatinine, Ser 2.03 (H) 0.44 - 1.00 mg/dL   Calcium 8.8 (L) 8.9 - 10.3 mg/dL   Total Protein 6.6 6.5 - 8.1 g/dL   Albumin 3.3 (L) 3.5 - 5.0 g/dL   AST 16 15 - 41 U/L   ALT 12 0 - 44 U/L   Alkaline Phosphatase 43 38 - 126 U/L   Total Bilirubin 0.6 0.3 - 1.2 mg/dL   GFR calc non Af Amer 25 (L) >60 mL/min   GFR calc Af Amer 28 (L) >60 mL/min   Anion gap 10 5 - 15    Comment: Performed at [facility], [address] [place], [place] [address]  Urinalysis, Complete w Microscopic     Status: Abnormal   Collection Time: 11/07/18  3:07 PM  Result Value Ref Range   Color, Urine YELLOW (A) YELLOW   APPearance CLOUDY (A) CLEAR   Specific  Gravity, Urine 1.009 1.005 - 1.030   pH 5.0 5.0 - 8.0   Glucose, UA NEGATIVE NEGATIVE mg/dL   Hgb urine dipstick LARGE (A) NEGATIVE   Bilirubin Urine NEGATIVE NEGATIVE   Ketones, ur NEGATIVE NEGATIVE mg/dL   Protein, ur NEGATIVE NEGATIVE mg/dL   Nitrite NEGATIVE NEGATIVE   Leukocytes,Ua LARGE (A) NEGATIVE   RBC / HPF >50 (H) 0 - 5 RBC/hpf   WBC, UA >50 (H) 0 - 5 WBC/hpf   Bacteria, UA NONE SEEN NONE SEEN   Squamous Epithelial / LPF 0-5 0 - 5   WBC Clumps PRESENT     Comment: Performed at [facility], [address] [place], [place] [address]   No results found.  Pending Labs [employer] (From admission, onward)    Start     Ordered   11/07/18 1556  SARS CORONAVIRUS 2 (TAT 6-24 HRS) Nasopharyngeal Nasopharyngeal Swab  (Asymptomatic/Tier 2 Patients Labs)  Once,   STAT    Question Answer Comment  Is this test for diagnosis or screening Screening   Symptomatic for COVID-19 as defined by CDC No   Hospitalized for COVID-19 No   Admitted to ICU for COVID-19 No   Previously tested for COVID-19 No   Resident in a congregate (group) care setting No   Employed in healthcare setting No   Pregnant No      11/07/18 1555   11/07/18 1227  Urine culture  ONCE - STAT,   STAT     11/07/18 1226   Signed and Held  Medical laboratory scientific officer  Tomorrow morning,   R     Signed and Held   Signed and Held  CBC  Tomorrow morning,   R     Signed and Held   Signed and Held  CBC  (heparin)  Once,   R    Comments: Baseline for heparin therapy IF NOT ALREADY DRAWN.  Notify MD if PLT < 100 K.    Signed and Held   Signed and Held  Creatinine, serum  (heparin)  Once,   R    Comments: Baseline for heparin therapy IF NOT ALREADY DRAWN.    Signed and Held          Vitals/Pain Today's Vitals   11/07/18  1800 11/07/18 1815 11/07/18 1830 11/07/18 1845  BP: (!) 152/83  (!) 134/95   Pulse:      Resp: 15 13 16 15   Temp:      TempSrc:      SpO2:   99%   Weight:      Height:      PainSc:         Isolation Precautions No active isolations  Medications Medications  0.9 %  sodium chloride infusion ( Intravenous New Bag/Given 11/07/18 1852)  0.9 %  sodium chloride infusion (has no administration in time range)  cefTRIAXone (ROCEPHIN) 1 g in sodium chloride 0.9 % 100 mL IVPB (0 g Intravenous Stopped 11/07/18 1845)    Mobility walks with device High fall risk   Focused Assessments UTI   R Recommendations: See Admitting Provider Note  Report given to:   Additional Notes:

## 2018-11-07 NOTE — Progress Notes (Signed)
Family Meeting Note  Advance Directive:yes  Today a meeting took place with the Daughter.  Patient is unable to participate due [id number] capacity Dementia.   The following clinical team members were present during this meeting:MD  The following were discussed:Patient's diagnosis: C. difficile, recurrent UTI, dementia, chronic renal failure, Patient's progosis: Unable to determine and Goals for treatment: Full Code  Additional follow-up to be provided: PMD  Time spent during discussion:20 minutes  Vaughan Basta, MD

## 2018-11-08 ENCOUNTER — Ambulatory Visit: Payer: Medicare Other | Admitting: Physician Assistant

## 2018-11-08 DIAGNOSIS — E860 Dehydration: Secondary | ICD-10-CM | POA: Diagnosis not present

## 2018-11-08 LAB — CBC
HCT: 30 % — ABNORMAL LOW (ref 36.0–46.0)
Hemoglobin: 8.9 g/dL — ABNORMAL LOW (ref 12.0–15.0)
MCH: 24.1 pg — ABNORMAL LOW (ref 26.0–34.0)
MCHC: 29.7 g/dL — ABNORMAL LOW (ref 30.0–36.0)
MCV: 81.1 fL (ref 80.0–100.0)
Platelets: 165 10*3/uL (ref 150–400)
RBC: 3.7 MIL/uL — ABNORMAL LOW (ref 3.87–5.11)
RDW: 17.2 % — ABNORMAL HIGH (ref 11.5–15.5)
WBC: 3.8 10*3/uL — ABNORMAL LOW (ref 4.0–10.5)
nRBC: 0 % (ref 0.0–0.2)

## 2018-11-08 LAB — BASIC METABOLIC PANEL
Anion gap: 11 (ref 5–15)
BUN: 38 mg/dL — ABNORMAL HIGH (ref 8–23)
CO2: 24 mmol/L (ref 22–32)
Calcium: 9 mg/dL (ref 8.9–10.3)
Chloride: 107 mmol/L (ref 98–111)
Creatinine, Ser: 1.7 mg/dL — ABNORMAL HIGH (ref 0.44–1.00)
GFR calc Af Amer: 35 mL/min — ABNORMAL LOW (ref >60)
GFR calc non Af Amer: 30 mL/min — ABNORMAL LOW (ref >60)
Glucose, Bld: 91 mg/dL (ref 70–99)
Potassium: 4.1 mmol/L (ref 3.5–5.1)
Sodium: 142 mmol/L (ref 135–145)

## 2018-11-08 LAB — URINE CULTURE: Culture: NO GROWTH

## 2018-11-08 LAB — SARS CORONAVIRUS 2 (TAT 6-24 HRS): SARS Coronavirus 2: NEGATIVE

## 2018-11-08 MED ORDER — SIROLIMUS 1 MG PO TABS
3.0000 mg | ORAL_TABLET | Freq: Every day | ORAL | Status: DC
  Administered 2018-11-08 – 2018-11-09 (×2): 3 mg via ORAL
  Filled 2018-11-08 (×2): qty 3

## 2018-11-08 NOTE — Progress Notes (Signed)
Spoke with Daughter on the phone to give an update. Per Daughter, patient had a fall this past July. Patent was also admitted to the hospital for a severe UTI. Patient was treated and discharge with [facility]. Daughter stated patient has hospital bed, wheelchair, walker and cane at home. Last [facility] PT visit was 8/31. The patient also has an aide from [employer] that comes seven days a week from 9:00 to 1:00 am. Daughter noted that the patient does ambulate with help, but has been unsteady and not ambulating as much.  Fuller Mandril, RN

## 2018-11-08 NOTE — Consult Note (Addendum)
[place] Nurse wound consult note Reason for Consult: Consult requested for buttocks.  [place] [facility] team is working remotely today; reviewed progress notes and nursing flowsheet in the EMR.   Wound type: Bedside nurse wound flow sheet indicates the patient has: Stage 2 pressure injury to right buttock; 2X2cm, red and moist Stage 3 pressure injury to left buttock, 1.5X3X.25cm, red and moist Pressure Injury POA: Yes Dressing procedure/placement/frequency: Topical treatment orders provided for bedside nurses to perform daily as follows: Aquacel to absorb drainage and promote healing to left buttock wound, foam dressing over both locations to protect and promote healing. Please re-consult if further assistance is needed.  Thank-you,  Julien Girt MSN, [place], [place], [place], [place]

## 2018-11-08 NOTE — Progress Notes (Signed)
[facility] [place] NAME: [name]    MR#:  [id number]  DATE OF BIRTH:  04/05/1950  SUBJECTIVE:  CHIEF COMPLAINT: Patient denies any abdominal pain nausea or vomiting no fever.  Patient answers most of the questions appropriately though she is demented  REVIEW OF SYSTEMS:  CONSTITUTIONAL: No fever, fatigue or weakness.  EYES: No blurred or double vision.  EARS, NOSE, AND THROAT: No tinnitus or ear pain.  RESPIRATORY: No cough, shortness of breath, wheezing or hemoptysis.  CARDIOVASCULAR: No chest pain, orthopnea, edema.  GASTROINTESTINAL: No nausea, vomiting, diarrhea or abdominal pain.  GENITOURINARY: No dysuria, hematuria.  ENDOCRINE: No polyuria, nocturia,  MUSCULOSKELETAL: No joint pain or arthritis.   NEUROLOGIC: No tingling, numbness, weakness.  PSYCHIATRY: No anxiety or depression.   DRUG ALLERGIES:   Allergies  Allergen Reactions  . Ace Inhibitors Cough  . Lisinopril Cough    VITALS:  Blood pressure (!) 160/106, pulse (!) 56, temperature 98 F (36.7 C), resp. rate 18, height 4\' 11"  (1.499 m), weight 95.7 kg, SpO2 100 %.  PHYSICAL EXAMINATION:  GENERAL:  68 y.o.-year-old patient lying in the bed with no acute distress.  EYES: Pupils equal, round, reactive to light and accommodation. No scleral icterus. Extraocular muscles intact.  HEENT: Head atraumatic, normocephalic. Oropharynx and nasopharynx clear.  NECK:  Supple, no jugular venous distention. No thyroid enlargement, no tenderness.  LUNGS: Normal breath sounds bilaterally, no wheezing, rales,rhonchi or crepitation. No use of accessory muscles of respiration.  CARDIOVASCULAR: S1, S2 normal. No murmurs, rubs, or gallops.  ABDOMEN: Soft, nontender, nondistended. Bowel sounds present. EXTREMITIES: No pedal edema, cyanosis, or clubbing.  NEUROLOGIC: Cranial nerves II through XII are intact. Muscle strength weak in all extremities. Sensation intact. Gait not  checked.  PSYCHIATRIC: The patient is alert and oriented x 2-3.  SKIN: No obvious rash, lesion, or ulcer.    LABORATORY PANEL:   CBC Recent Labs  Lab 11/08/18 0434  WBC 3.8*  HGB 8.9*  HCT 30.0*  PLT 165   ------------------------------------------------------------------------------------------------------------------  Chemistries  Recent Labs  Lab 11/07/18 1223 11/08/18 0434  NA 139 142  K 3.7 4.1  CL 105 107  CO2 24 24  GLUCOSE 131* 91  BUN 41* 38*  CREATININE 2.03* 1.70*  CALCIUM 8.8* 9.0  AST 16  --   ALT 12  --   ALKPHOS 43  --   BILITOT 0.6  --    ------------------------------------------------------------------------------------------------------------------  Cardiac Enzymes No results for input(s): TROPONINI in the last 168 hours. ------------------------------------------------------------------------------------------------------------------  RADIOLOGY:  No results found.  EKG:   Orders placed or performed during the hospital encounter of 09/22/18  . EKG 12-Lead  . EKG 12-Lead    ASSESSMENT AND PLAN:    Possible UTI Patient was treated for UTI and had C. difficile last month which she is still getting treatment for. She has more than 50 WBCs in urine all the time for last few months but her nitrite is negative. White blood cell count is not high.  She does not have fever.  Patient is asymptomatic I would hold antibiotics for now.  She received 1 dose of ceftriaxone by ER. Urine culture is pending We should make further decisions depending on her urine culture whether to treat this is UTI or not , would involve infectious disease specialist if needed.  *C. difficile She is currently treated by Dificid, continue for now. Supposed to be treated until 25th September. On isolation for VRE  and C. difficile  *Acute on chronic renal insufficiency stage III Mild worsening of renal function compared to last month, give IV fluid and  monitor Baseline creatinine at 1.5 creatinine 1.5-2.0-1.7  *Hypertension Continue home medications.  *Hyperlipidemia Continue atorvastatin.    *Dementia Lives with her daughter, continue Celexa and Aricept. Patient has caregiver at home     All the records are reviewed and case discussed with Care Management/Social Workerr. Management plans discussed with the patient, family and they are in agreement.  CODE STATUS: fc   TOTAL TIME TAKING CARE OF THIS PATIENT:  36  minutes.   POSSIBLE D/C IN 2  DAYS, DEPENDING ON CLINICAL CONDITION.  Note: This dictation was prepared with Dragon dictation along with smaller phrase technology. Any transcriptional errors that result from this process are unintentional.   Nicholes Mango M.D on 11/08/2018 at 3:02 PM  Between 7am to 6pm - Pager - [phone] After 6pm go to [email or web address] - password [email or web address] [facility]  Office  [phone]  CC: Primary care physician; Baxter Hire, MD

## 2018-11-08 NOTE — TOC Initial Note (Signed)
Transition of Care [facility]) - Initial/Assessment Note    Patient Details  Name: [name] MRN: [id number] Date of Birth: 1950-07-27  Transition of Care [facility]) CM/SW Contact:    Beverly Sessions, RN Phone Number: 11/08/2018, 2:50 PM  Clinical Narrative:                 Patient admitted from home with UTI Patient with history of dementia   Assessment completed with daughter Ms Danne Baxter via phone.   Patient lives at home with daughter. Patient has [place] seven days a week from 9:00-1:00. Patient open with [place] with [place] notified of admission  Patient has hosplital bed, front wheeled walker, four prong cane and wheelchair at home.   Dr. Edwina Barth  Daughter provides transportation to appointments. Denies issues obtaining medications    Expected Discharge Plan: [place]     Patient Goals and CMS Choice        Expected Discharge Plan and Services Expected Discharge Plan: [place] arrangements for the past 2 months: [place] Agency: [place] ([place]) Date [facility] Contacted: 11/08/18   Representative spoke with at [place]: Corene Cornea  Prior Living Arrangements/Services Living arrangements for the past 2 months: [place] Lives with:: Adult Children                   Activities of Daily Living Home Assistive Devices/Equipment: Cane (specify quad or straight), Walker (specify type) ADL Screening (condition at time of admission) Patient's cognitive ability adequate to safely complete daily activities?: Yes Is the patient deaf or have difficulty hearing?: No Does the patient have difficulty seeing, even when wearing glasses/contacts?: No Does the patient have difficulty concentrating, remembering, or making decisions?: Yes Patient able to express need for assistance with ADLs?: Yes Does the  patient have difficulty dressing or bathing?: Yes Independently performs ADLs?: Yes (appropriate for developmental age) Does the patient have difficulty walking or climbing stairs?: Yes Weakness of Legs: Both Weakness of Arms/Hands: None  Permission Sought/Granted                  Emotional Assessment           Psych Involvement: No (comment)  Admission diagnosis:  Dehydration [E86.0] Lower urinary tract infectious disease [N39.0] Renal transplant recipient [Z94.0] Patient Active Problem List   Diagnosis Date Noted  . Acute lower UTI 11/07/2018  . C. difficile colitis 11/07/2018  . Pressure injury of skin 09/23/2018  . Acute encephalopathy 09/22/2018  . Renal failure (ARF), acute on chronic ([place]) 08/12/2018  . Confusion 03/23/2018  . Hypokalemia 06/01/2017  . Sepsis ([place]) 06/29/2016  . UTI (urinary tract infection) 06/29/2016  . GERD (gastroesophageal reflux disease) 06/29/2016  . Dementia ([place]) 06/29/2016  . 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 Essential hypertension 07/06/2014  . H/O kidney transplant 07/06/2014   PCP:  Baxter Hire, MD Pharmacy:   [id number] [address], [place] [address] Phone: [phone] Fax: [phone]  [place], [place] [address] [place] [address] Phone: [phone] Fax:  [phone]     Social Determinants of Health (SDOH) Interventions    Readmission Risk Interventions Readmission Risk Prevention Plan 08/15/2018  Transportation Screening Complete  PCP or Specialist Appt within 3-5 Days Complete  HRI or Home Care Consult Complete  Palliative Care Screening Not Applicable  Medication Review (RN Care Manager) Complete  Some recent data might be hidden

## 2018-11-09 DIAGNOSIS — E860 Dehydration: Secondary | ICD-10-CM | POA: Diagnosis not present

## 2018-11-09 NOTE — Discharge Summary (Signed)
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 difficile colitis Pressure ulcers present at the time of admission  SECONDARY DIAGNOSIS:   Past Medical History:  Diagnosis Date  . Anemia   . C. difficile colitis   . Cancer of kidney ([place])   . Dementia ([place])   . GERD (gastroesophageal reflux disease)   . Hypertension   . Renal disorder   . Uterine cancer [facility])     HOSPITAL COURSE:  HISTORY OF PRESENT ILLNESS: [name]  is a 68 y.o. female with a known history of C. difficile colitis, kidney cancer, dementia, hypertension, uterine cancer, status post kidney transplant-lives with her daughter due to terminal dementia.  She was admitted to hospital last month for UTI and followed by C. Difficile.  She was discharged home with oral vancomycin.  Diarrhea continued after 10 days of oral vancomycin treatment so primary care physician sent her to gastroenterologist who started on Dificid.  She has good response and she was supposed to continue that until 25 September. As per daughter patient was complaining of some itching and pain in her vaginal area and her diapers were smelling very bad for last 2 to 3 days so she thought this is a UTI and sent the patient back to emergency room.  Daughter denied to me any fever or chills. ER physician called me for admitting for UTI.  They gave 1 dose of IV Rocephin and sent for urine culture.  *Acute on chronic renal insufficiency stage III-dehydration Mild worsening of renal function compared to last month, given IV fluid and  monitor Baseline creatinine at 1.5 creatinine 1.5-2.0-1.7 Dehydration improved with IV fluids   Possible UTI Patient was treated for UTI and had C. difficile last month which she is still getting treatment for. She has more than 50 WBCs in urine all the time for last few months but her nitrite is negative. White blood cell count is not high. She does not have fever.  Patient is asymptomatic I would hold antibiotics for now. She received 1 dose of ceftriaxone by ER. Urine culture with no growth, no need of antibiotics and will discharge patient home   *C. difficile She is currently treated by Dificid,continue for now. Supposed to be treated until 25th September. On isolation for VRE and C. difficile  *Hypertension Continue home medications.  *Hyperlipidemia Continue atorvastatin.  *Dementia Lives with her daughter, continue Celexa and Aricept. Patient has caregiver at home   Chronic pressure ulcers Reposition patient every 2 hours Stage 2 pressure injury to right buttock; 2X2cm, red and moist Stage 3 pressure injury to left buttock, 1.5X3X.25cm, red and moist Continue wound care  Discharge patient home  DISCHARGE CONDITIONS:   fair  CONSULTS OBTAINED:     PROCEDURES  None   DRUG ALLERGIES:   Allergies  Allergen Reactions  . Ace Inhibitors Cough  . Lisinopril Cough    DISCHARGE MEDICATIONS:   Allergies as of 11/09/2018      Reactions   Ace Inhibitors Cough   Lisinopril Cough      Medication List    TAKE  these medications   atorvastatin 40 MG tablet Commonly known as: LIPITOR Take 40 mg by mouth daily.   calcitRIOL 0.25 MCG capsule Commonly known as: ROCALTROL Take 0.25 mcg by mouth daily.   cholecalciferol 25 MCG (1000 UT) tablet Commonly known as: VITAMIN D3 Take 1,000 Units by mouth daily.   citalopram 20 MG tablet Commonly known as: CELEXA Take 20 mg by mouth daily.   Dificid 200 MG Tabs tablet Generic drug: fidaxomicin Take  1 tablet (200 mg total) by mouth 2 (two) times daily for 14 days.   donepezil 5 MG tablet Commonly known as: ARICEPT Take 5 mg by mouth at bedtime.   feeding supplement (ENSURE ENLIVE) Liqd Take 237 mLs by mouth 2 (two) times daily between meals.   folic acid 1 MG tablet Commonly known as: FOLVITE Take 1 mg by mouth 2 (two) times daily.   furosemide 20 MG tablet Commonly known as: LASIX Take 1 tablet (20 mg total) by mouth daily. What changed: when to take this   Melatonin 10 MG Tabs Take 20 mg by mouth at bedtime.   meloxicam 15 MG tablet Commonly known as: MOBIC Take 15 mg by mouth daily.   metoprolol tartrate 25 MG tablet Commonly known as: LOPRESSOR Take 25 mg by mouth 2 (two) times daily.   mycophenolate 180 MG EC tablet Commonly known as: MYFORTIC Take 180 mg by mouth 2 (two) times daily.   potassium chloride 10 MEQ tablet Commonly known as: K-DUR Take 2 tablets (20 mEq total) by mouth daily. ONLY WHILE TAKING LASIX What changed:   when to take this  additional instructions   sirolimus 1 MG tablet Commonly known as: RAPAMUNE Take 3 mg by mouth daily.   traZODone 50 MG tablet Commonly known as: DESYREL Take 50 mg by mouth at bedtime.   Tums Ultra 1000 400 MG chewable tablet Generic drug: calcium elemental as carbonate Chew 1,000 mg by mouth daily as needed for heartburn.        DISCHARGE INSTRUCTIONS:   Follow-up with primary care physician in 3 days Continue wound care as recommended   DIET:  Cardiac diet  DISCHARGE CONDITION:  Fair  ACTIVITY:  Activity as tolerated  OXYGEN:  Home Oxygen: No.   Oxygen Delivery: room air  DISCHARGE LOCATION:  home   If you experience worsening of your admission symptoms, develop shortness of breath, life threatening emergency, suicidal or homicidal thoughts you must seek medical attention immediately by calling 911 or calling your MD immediately  if symptoms less severe.  You Must read complete  instructions/literature along with all the possible adverse reactions/side effects for all the Medicines you take and that have been prescribed to you. Take any new Medicines after you have completely understood and accpet all the possible adverse reactions/side effects.   Please note  You were cared for by a hospitalist during your hospital stay. If you have any questions about your discharge medications or the care you received while you were in the hospital after you are discharged, you can call the unit and asked to speak with the hospitalist on call if the hospitalist that took care of you is not available. Once you are discharged, your primary care physician will handle any further medical issues. Please note that NO REFILLS for any discharge medications will be authorized once you are discharged, as it is imperative that you return to your primary care physician (or establish a relationship with a primary care physician if you do not  have one) for your aftercare needs so that they can reassess your need for medications and monitor your lab values.     Today  Chief Complaint  Patient presents with  . Pelvic Pain   Is feeling much better and wants to go home denies any abdominal pain shortness of breath or chest pain.  Patient has chronic pressure ulcer ROS:  CONSTITUTIONAL: Denies fevers, chills. Denies any fatigue, weakness.  EYES: Denies blurry vision, double vision, eye pain. EARS, NOSE, THROAT: Denies tinnitus, ear pain, hearing loss. RESPIRATORY: Denies cough, wheeze, shortness of breath.  CARDIOVASCULAR: Denies chest pain, palpitations, edema.  GASTROINTESTINAL: Denies nausea, vomiting, diarrhea, abdominal pain. Denies bright red blood per rectum. GENITOURINARY: Denies dysuria, hematuria. ENDOCRINE: Denies nocturia or thyroid problems. HEMATOLOGIC AND LYMPHATIC: Denies easy bruising or bleeding. SKIN: 2 pressure ulcers on the buttock. MUSCULOSKELETAL: Denies pain in neck, back,  shoulder, knees, hips or arthritic symptoms.  NEUROLOGIC: Denies paralysis, paresthesias.  PSYCHIATRIC: Denies anxiety or depressive symptoms.   VITAL SIGNS:  Blood pressure (!) 156/78, pulse (!) 59, temperature 98.3 F (36.8 C), temperature source Oral, resp. rate 20, height 4\' 11"  (1.499 m), weight 95.7 kg, SpO2 100 %.  I/O:    Intake/Output Summary (Last 24 hours) at 11/09/2018 1323 Last data filed at 11/09/2018 0403 Gross per 24 hour  Intake 2082.52 ml  Output -  Net 2082.52 ml    PHYSICAL EXAMINATION:  GENERAL:  68 y.o.-year-old patient lying in the bed with no acute distress.  EYES: Pupils equal, round, reactive to light and accommodation. No scleral icterus. Extraocular muscles intact.  HEENT: Head atraumatic, normocephalic. Oropharynx and nasopharynx clear.  NECK:  Supple, no jugular venous distention. No thyroid enlargement, no tenderness.  LUNGS: Normal breath sounds bilaterally, no wheezing, rales,rhonchi or crepitation. No use of accessory muscles of respiration.  CARDIOVASCULAR: S1, S2 normal. No murmurs, rubs, or gallops.  ABDOMEN: Soft, non-tender, non-distended. Bowel sounds present.  EXTREMITIES: No pedal edema, cyanosis, or clubbing.  NEUROLOGIC: Cranial nerves II through XII are intact. Muscle strength weak in all extremities. Sensation intact. Gait not checked.  PSYCHIATRIC: The patient is alert and oriented x 3.  SKIN:  stage II on the right buttock and a stage III on left buttock No obvious rash, lesion  DATA REVIEW:   CBC Recent Labs  Lab 11/08/18 0434  WBC 3.8*  HGB 8.9*  HCT 30.0*  PLT 165    Chemistries  Recent Labs  Lab 11/07/18 1223 11/08/18 0434  NA 139 142  K 3.7 4.1  CL 105 107  CO2 24 24  GLUCOSE 131* 91  BUN 41* 38*  CREATININE 2.03* 1.70*  CALCIUM 8.8* 9.0  AST 16  --   ALT 12  --   ALKPHOS 43  --   BILITOT 0.6  --     Cardiac Enzymes No results for input(s): TROPONINI in the last 168 hours.  Microbiology Results   Results for orders placed or performed during the hospital encounter of 11/07/18  Urine culture     Status: None   Collection Time: 11/07/18  3:07 PM   Specimen: Urine, Random  Result Value Ref Range Status   Specimen Description   Final    URINE, RANDOM Performed at [facility], [address], [place], [place] [address]    Special Requests   Final    NONE Performed at [facility], [address] [place], [place] [address]    Culture   Final    NO  GROWTH Performed at [facility], [place] [address] [place], [place] [address]    Report Status 11/08/2018 FINAL  Final  SARS CORONAVIRUS 2 (TAT 6-24 HRS) Nasopharyngeal Nasopharyngeal Swab     Status: None   Collection Time: 11/07/18  9:05 PM   Specimen: Nasopharyngeal Swab  Result Value Ref Range Status   SARS Coronavirus 2 NEGATIVE NEGATIVE Final    Comment: (NOTE) SARS-CoV-2 target nucleic acids are NOT DETECTED. The SARS-CoV-2 RNA is generally detectable in upper and lower respiratory specimens during the acute phase of infection. Negative results do not preclude SARS-CoV-2 infection, do not rule out co-infections with other pathogens, and should not be used as the sole basis for treatment or other patient management decisions. Negative results must be combined with clinical observations, patient history, and epidemiological information. The expected result is Negative. Fact Sheet for Patients: SugarRoll.be Fact Sheet for Healthcare Providers: https://www.woods-mathews.com/ This test is not yet approved or cleared by the [place] FDA and  has been authorized for detection and/or diagnosis of SARS-CoV-2 by FDA under an Emergency Use Authorization (EUA). This EUA will remain  in effect (meaning this test can be used) for the duration of the COVID-19 declaration under Section 56 4(b)(1) of the Act, 21 U.S.C. section 360bbb-3(b)(1), unless the  authorization is terminated or revoked sooner. Performed at [facility], [place] [address], [place], [place] [address]     RADIOLOGY:  No results found.  EKG:   Orders placed or performed during the hospital encounter of 09/22/18  . EKG 12-Lead  . EKG 12-Lead      Management plans discussed with the patient, family and they are in agreement.  CODE STATUS:     Code Status Orders  (From admission, onward)         Start     Ordered   11/07/18 2148  Full code  Continuous     11/07/18 2147        Code Status History    Date Active Date Inactive Code Status Order ID Comments User Context   09/22/2018 1852 09/27/2018 1908 Full Code [id number]  Dustin Flock, MD Inpatient   08/12/2018 1749 08/15/2018 1758 Full Code [id number]  Demetrios Loll, MD Inpatient   06/20/2018 0041 06/21/2018 1616 Full Code [id number]  Lance Coon, MD Inpatient   03/23/2018 0357 03/26/2018 1620 Full Code [id number]  Harrie Foreman, MD Inpatient   11/21/2017 0839 11/23/2017 1708 Full Code [id number]  Fritzi Mandes, MD Inpatient   06/01/2017 1402 06/03/2017 1831 Full Code [id number]  Loletha Grayer, MD ED   06/29/2016 2314 07/02/2016 1712 Full Code [id number]  Lance Coon, MD Inpatient   07/06/2014 2201 07/08/2014 1537 Full Code [id number]  Hower, Aaron Mose, MD ED   Advance Care Planning Activity      TOTAL TIME TAKING CARE OF THIS PATIENT: 43  minutes.   Note: This dictation was prepared with Dragon dictation along with smaller phrase technology. Any transcriptional errors that result from this process are unintentional.   @[facility] @  on 11/09/2018 at 1:23 PM  Between 7am to 6pm - Pager - [phone]  After 6pm go to [email or web address] - password [email or web address] [facility]  Office  [phone]  CC: Primary care physician; Baxter Hire, MD

## 2018-11-09 NOTE — TOC Transition Note (Signed)
Transition of Care [facility]) - CM/SW Discharge Note   Patient Details  Name: [name] MRN: [id number] Date of Birth: April 15, 1950  Transition of Care [facility]) CM/SW Contact:  Beverly Sessions, RN Phone Number: 11/09/2018, 12:01 PM   Clinical Narrative:    Patient to discharge today with resumption of home health orders through [place].  Corene Cornea with [place] notified.  Will not require resumption orders as patient is observation    Final next level of care: [place]     Patient Goals and CMS Choice        Discharge Placement                       Discharge Plan and Services                            [place]: [place] ([place]) Date [place]: 11/09/18 Time [place]: 7530 Representative spoke with at [place]: [place] ([place]) Interventions     Readmission Risk Interventions Readmission Risk Prevention Plan 08/15/2018  Transportation Screening Complete  PCP or Specialist Appt within 3-5 Days Complete  HRI or Home Care Consult Complete  Palliative Care Screening Not Applicable  Medication Review (RN Care Manager) Complete  Some recent data might be hidden

## 2018-11-09 NOTE — Care Management Obs Status (Signed)
[place] NOTIFICATION   Patient Details  Name: [name] MRN: [id number] Date of Birth: 11-Jan-1951   Medicare Observation Status Notification Given:  Yes    Beverly Sessions, RN 11/09/2018, 10:14 AM

## 2018-11-09 NOTE — Discharge Instructions (Signed)
Follow-up with primary care physician in 3 days Continue wound care as recommended

## 2018-11-16 ENCOUNTER — Telehealth: Payer: Self-pay | Admitting: Gastroenterology

## 2018-11-16 NOTE — Telephone Encounter (Signed)
Caryl Pina or Sherald Hess Please inform patient she will be need to be seen by GYN for a pelvic exam to rule out a fistula  CC: Dr. Edwina Barth

## 2018-11-16 NOTE — Telephone Encounter (Signed)
Patient's daughter [name]  called & states the patient is having stool coming  out of her vagina. Please advise.

## 2018-11-19 NOTE — Telephone Encounter (Signed)
Patient daughter verbalized understanding  

## 2018-11-21 ENCOUNTER — Ambulatory Visit: Payer: Medicare Other | Admitting: Internal Medicine

## 2018-11-22 ENCOUNTER — Other Ambulatory Visit: Payer: Self-pay

## 2018-11-22 ENCOUNTER — Encounter: Payer: Medicare Other | Attending: Internal Medicine | Admitting: Physician Assistant

## 2018-11-22 DIAGNOSIS — Z87891 Personal history of nicotine dependence: Secondary | ICD-10-CM | POA: Insufficient documentation

## 2018-11-22 DIAGNOSIS — L2489 Irritant contact dermatitis due to other agents: Secondary | ICD-10-CM | POA: Diagnosis not present

## 2018-11-22 DIAGNOSIS — F0390 Unspecified dementia without behavioral disturbance: Secondary | ICD-10-CM | POA: Diagnosis not present

## 2018-11-22 DIAGNOSIS — Z940 Kidney transplant status: Secondary | ICD-10-CM | POA: Diagnosis not present

## 2018-11-22 DIAGNOSIS — L98419 Non-pressure chronic ulcer of buttock with unspecified severity: Secondary | ICD-10-CM | POA: Diagnosis present

## 2018-11-22 DIAGNOSIS — A0472 Enterocolitis due to Clostridium difficile, not specified as recurrent: Secondary | ICD-10-CM | POA: Insufficient documentation

## 2018-11-22 DIAGNOSIS — G629 Polyneuropathy, unspecified: Secondary | ICD-10-CM | POA: Insufficient documentation

## 2018-11-22 DIAGNOSIS — I10 Essential (primary) hypertension: Secondary | ICD-10-CM | POA: Diagnosis not present

## 2018-11-22 NOTE — Progress Notes (Signed)
[name] ([id number]) Visit Report for 11/22/2018 Abuse/Suicide Risk Screen Details Patient Name: [name], [name] Date of Service: 11/22/2018 2:15 PM Medical Record Number: [id number] Patient Account Number: [id number] Date of Birth/Sex: Jan 27, 1951 (68 y.o. F) Treating RN: Cornell Barman Primary Care Rheta Hemmelgarn: Harrel Lemon Other Clinician: Referring Avrom Robarts: Harrel Lemon Treating Korynne Dols/Extender: Melburn Hake, HOYT Weeks in Treatment: 0 Abuse/Suicide Risk Screen Items Answer ABUSE RISK SCREEN: Has anyone close to you tried to hurt or harm you recentlyo No Do you feel uncomfortable with anyone in your familyo No Has anyone forced you do things that you didnot want to doo No Electronic Signature(s) Signed: 11/22/2018 4:55:32 PM By: Gretta Cool, BSN, RN, CWS, Kim RN, BSN Entered By: Gretta Cool, BSN, RN, CWS, Kim on 11/22/2018 14:27:25 [name] ([id number]) -------------------------------------------------------------------------------- Activities of Daily Living Details Patient Name: [name] Date of Service: 11/22/2018 2:15 PM Medical Record Number: [id number] Patient Account Number: [id number] Date of Birth/Sex: 1950-12-15 (68 y.o. F) Treating RN: Cornell Barman Primary Care Suanne Minahan: Harrel Lemon Other Clinician: Referring Abubakar Crispo: Harrel Lemon Treating Taran Hable/Extender: Melburn Hake, HOYT Weeks in Treatment: 0 Activities of Daily Living Items Answer Activities of Daily Living (Please select one for each item) Drive Automobile Not Able Take Medications Need Assistance Use Telephone Need Assistance Care for Appearance Need Assistance Use Toilet Need Assistance Bath / Shower Need Assistance Dress Self Need Assistance Feed Self Completely Able Walk Completely Able Get In / Out Bed Completely Able Housework Need Assistance Prepare Meals Need Assistance Handle Money Need Assistance Shop for Self Need Assistance Electronic Signature(s) Signed: 11/22/2018 4:55:32 PM By:  Gretta Cool, BSN, RN, CWS, Kim RN, BSN Entered By: Gretta Cool, BSN, RN, CWS, Kim on 11/22/2018 14:28:13 [name], [name] ([id number]) -------------------------------------------------------------------------------- Education Screening Details Patient Name: [name] Date of Service: 11/22/2018 2:15 PM Medical Record Number: [id number] Patient Account Number: [id number] Date of Birth/Sex: Mar 06, 1950 (68 y.o. F) Treating RN: Cornell Barman Primary Care Fareed Fung: Harrel Lemon Other Clinician: Referring Tatayana Beshears: Harrel Lemon Treating Courtny Bennison/Extender: Melburn Hake, HOYT Weeks in Treatment: 0 Learning Preferences/Education Level/Primary Language Learning Preference: Explanation, Demonstration Highest Education Level: High School Preferred Language: English Cognitive Barrier Language Barrier: No Translator Needed: No Memory Deficit: No Emotional Barrier: No Cultural/Religious Beliefs Affecting Medical Care: No Physical Barrier Impaired Vision: No Impaired Hearing: No Decreased Hand dexterity: No Knowledge/Comprehension Knowledge Level: Medium Comprehension Level: Medium Ability to understand written Medium instructions: Ability to understand verbal Medium instructions: Motivation Anxiety Level: Calm Cooperation: Cooperative Education Importance: Acknowledges Need Interest in Health Problems: Asks Questions Perception: Confused Willingness to Engage in Self- Medium Management Activities: Readiness to Engage in Self- Medium Management Activities: Electronic Signature(s) Signed: 11/22/2018 4:55:32 PM By: Gretta Cool, BSN, RN, CWS, Kim RN, BSN Entered By: Gretta Cool, BSN, RN, CWS, Kim on 11/22/2018 14:29:02 [name], [name] ([id number]) -------------------------------------------------------------------------------- Fall Risk Assessment Details Patient Name: [name] Date of Service: 11/22/2018 2:15 PM Medical Record Number: [id number] Patient Account Number: [id number] Date of  Birth/Sex: 08/08/1950 (68 y.o. F) Treating RN: Cornell Barman Primary Care Helix Lafontaine: Harrel Lemon Other Clinician: Referring Heiley Shaikh: Harrel Lemon Treating Shaneese Tait/Extender: Melburn Hake, HOYT Weeks in Treatment: 0 Fall Risk Assessment Items Have you had 2 or more falls in the last 12 monthso 0 No Have you had any fall that resulted in injury in the last 12 monthso 0 No FALLS RISK SCREEN History of falling - immediate or within 3 months 0 No Secondary diagnosis (Do you have 2 or more medical diagnoseso) 0 No Ambulatory aid None/bed rest/wheelchair/nurse 0 Yes Crutches/cane/walker 0 No Furniture 0 No Intravenous therapy  Access/Saline/Heparin Lock 0 No Gait/Transferring Normal/ bed rest/ wheelchair 0 No Weak (short steps with or without shuffle, stooped but able to lift head while 10 Yes walking, may seek support from furniture) Impaired (short steps with shuffle, may have difficulty arising from chair, head 0 No down, impaired balance) Mental Status Oriented to own ability 0 No Electronic Signature(s) Signed: 11/22/2018 4:55:32 PM By: Gretta Cool, BSN, RN, CWS, Kim RN, BSN Entered By: Gretta Cool, BSN, RN, CWS, Kim on 11/22/2018 14:29:39 [name] ([id number]) -------------------------------------------------------------------------------- Foot Assessment Details Patient Name: [name] Date of Service: 11/22/2018 2:15 PM Medical Record Number: [id number] Patient Account Number: [id number] Date of Birth/Sex: 02/10/1951 (68 y.o. F) Treating RN: Cornell Barman Primary Care Mieczyslaw Stamas: Harrel Lemon Other Clinician: Referring Griselda Bramblett: Harrel Lemon Treating Takirah Binford/Extender: Melburn Hake, HOYT Weeks in Treatment: 0 Foot Assessment Items Site Locations + = Sensation present, - = Sensation absent, C = Callus, U = Ulcer R = Redness, W = Warmth, M = Maceration, PU = Pre-ulcerative lesion F = Fissure, S = Swelling, D = Dryness Assessment Right: Left: Other Deformity: No No Prior Foot  Ulcer: No No Prior Amputation: No No Charcot Joint: No No Ambulatory Status: Ambulatory Without Help Gait: Steady Electronic Signature(s) Signed: 11/22/2018 4:55:32 PM By: Gretta Cool, BSN, RN, CWS, Kim RN, BSN Entered By: Gretta Cool, BSN, RN, CWS, Kim on 11/22/2018 14:30:14 [name] ([id number]) -------------------------------------------------------------------------------- Nutrition Risk Screening Details Patient Name: [name] Date of Service: 11/22/2018 2:15 PM Medical Record Number: [id number] Patient Account Number: [id number] Date of Birth/Sex: 01/14/1951 (68 y.o. F) Treating RN: Cornell Barman Primary Care Dalena Plantz: Harrel Lemon Other Clinician: Referring Audel Coakley: Harrel Lemon Treating Lummie Montijo/Extender: Melburn Hake, HOYT Weeks in Treatment: 0 Height (in): 59 Weight (lbs): 193 Body Mass Index (BMI): 39 Nutrition Risk Screening Items Score Screening NUTRITION RISK SCREEN: I have an illness or condition that made me change the kind and/or amount of 0 No food I eat I eat fewer than two meals per day 0 No I eat few fruits and vegetables, or milk products 0 No I have three or more drinks of beer, liquor or wine almost every day 0 No I have tooth or mouth problems that make it hard for me to eat 0 No I don't always have enough money to buy the food I need 0 No I eat alone most of the time 0 No I take three or more different prescribed or over-the-counter drugs a day 1 Yes Without wanting to, I have lost or gained 10 pounds in the last six months 0 No I am not always physically able to shop, cook and/or feed myself 0 No Nutrition Protocols Good Risk Protocol 0 No interventions needed Moderate Risk Protocol High Risk Proctocol Risk Level: Good Risk Score: 1 Electronic Signature(s) Signed: 11/22/2018 4:55:32 PM By: Gretta Cool, BSN, RN, CWS, Kim RN, BSN Entered By: Gretta Cool, BSN, RN, CWS, Kim on 11/22/2018 14:30:05

## 2018-11-29 ENCOUNTER — Encounter: Payer: Medicare Other | Admitting: Physician Assistant

## 2018-11-29 ENCOUNTER — Other Ambulatory Visit: Payer: Self-pay

## 2018-11-29 DIAGNOSIS — N390 Urinary tract infection, site not specified: Secondary | ICD-10-CM | POA: Diagnosis not present

## 2018-11-29 DIAGNOSIS — R253 Fasciculation: Secondary | ICD-10-CM | POA: Diagnosis not present

## 2018-11-29 NOTE — Progress Notes (Addendum)
NORVA, BOWE ([id number]) Visit Report for 11/29/2018 Chief Complaint Document Details Patient Name: [name], [name] Date of Service: 11/29/2018 3:45 PM Medical Record Number: [id number] Patient Account Number: [id number] Date of Birth/Sex: 1950/10/04 (68 y.o. F) Treating RN: Cornell Barman Primary Care Provider: Harrel Lemon Other Clinician: Referring Provider: Harrel Lemon Treating Provider/Extender: Melburn Hake, HOYT Weeks in Treatment: 1 Information Obtained from: Patient Chief Complaint Left gluteal fold ulcer Electronic Signature(s) Signed: 11/29/2018 3:59:46 PM By: Worthy Keeler PA-C Entered By: Worthy Keeler on 11/29/2018 15:59:46 [place], [name] ([id number]) -------------------------------------------------------------------------------- HPI Details Patient Name: [name] Date of Service: 11/29/2018 3:45 PM Medical Record Number: [id number] Patient Account Number: [id number] Date of Birth/Sex: December 19, 1950 (68 y.o. F) Treating RN: Cornell Barman Primary Care Provider: Harrel Lemon Other Clinician: Referring Provider: Harrel Lemon Treating Provider/Extender: Melburn Hake, HOYT Weeks in Treatment: 1 History of Present Illness HPI Description: 11/22/2018 patient presents today for initial evaluation in our clinic concerning issues that she has been having with a wound in the left gluteal fold. This has been present since August and was noted initially when she was in the hospital. Fortunately there is no signs of active infection at this time. No fever chills noted. Up to this point DuoDERM has been utilized although I [name] not sure that is the best treatment option for her based on what I [name] seeing currently. This actually started as a result of moisture associated skin damage secondary to a C. difficile infection. The patient does have dementia. She is also status post having had a kidney transplant and does have hypertension as well. She does spend a lot of her time  during the day sitting which is probably a part of why this is having trouble healing although it is not really a pressure ulcer that may be contributing to the lack of significant and rapid improvement. She still is having diarrhea as well and is still on treatment for the C. difficile infection she is on Dificid. They have been using barrier cream in the gluteal region to try to help protect the area where the dressing was not. 11/29/2018 upon evaluation today patient does not appear to be doing significantly better at this time. She still had some somewhat softer stools but not really as much diarrhea as what she had when the C. difficile infection was not full cycle. Overall things seem to be doing better in that regard although she does have 2 new areas on evaluation today. There is no signs of active infection. No fevers, chills, nausea, vomiting, or diarrhea. Electronic Signature(s) Signed: 11/29/2018 4:24:21 PM By: Worthy Keeler PA-C Entered By: Worthy Keeler on 11/29/2018 16:24:21 [name], [name] ([id number]) -------------------------------------------------------------------------------- Physical Exam Details Patient Name: [name] Date of Service: 11/29/2018 3:45 PM Medical Record Number: [id number] Patient Account Number: [id number] Date of Birth/Sex: 1950-08-05 (68 y.o. F) Treating RN: Cornell Barman Primary Care Provider: Harrel Lemon Other Clinician: Referring Provider: Harrel Lemon Treating Provider/Extender: Melburn Hake, HOYT Weeks in Treatment: 1 Constitutional Well-nourished and well-hydrated in no acute distress. Respiratory normal breathing without difficulty. clear to auscultation bilaterally. Cardiovascular regular rate and rhythm with normal S1, S2. Psychiatric this patient is able to make decisions and demonstrates good insight into disease process. Alert and Oriented x 3. pleasant and cooperative. Notes Upon inspection today patient's wound bed  actually showed signs of granulation tissue although not significantly smaller. Unfortunately she does have 2 new areas as well upon evaluation today. This obviously is not good news and not the direction  that were really wanting to head and as far as healing is concerned. With that being said there is no evidence of active infection at this time. Electronic Signature(s) Signed: 11/29/2018 6:12:29 PM By: Worthy Keeler PA-C Entered By: Worthy Keeler on 11/29/2018 18:12:29 [name], [name] ([id number]) -------------------------------------------------------------------------------- Physician Orders Details Patient Name: [name] Date of Service: 11/29/2018 3:45 PM Medical Record Number: [id number] Patient Account Number: [id number] Date of Birth/Sex: [birth year] (68 y.o. F) Treating RN: Montey Hora Primary Care Provider: Harrel Lemon Other Clinician: Referring Provider: Harrel Lemon Treating Provider/Extender: Melburn Hake, HOYT Weeks in Treatment: 1 Verbal / Phone Orders: No Diagnosis Coding ICD-10 Coding Code Description L24.89 Irritant contact dermatitis due to other agents F03.90 Unspecified dementia without behavioral disturbance Z94.0 Kidney transplant status I10 Essential (primary) hypertension Wound Cleansing Wound #1 Left Gluteal fold o Clean wound with Normal Saline. - in office o Cleanse wound with mild soap and water Wound #2 Right Gluteal fold o Clean wound with Normal Saline. - in office o Cleanse wound with mild soap and water Wound #3 Left,Medial Upper Leg o Clean wound with Normal Saline. - in office o Cleanse wound with mild soap and water Skin Barriers/Peri-Wound Care Wound #1 Left Gluteal fold o Skin Prep - periwound Wound #2 Right Gluteal fold o Skin Prep - periwound Wound #3 Left,Medial Upper Leg o Skin Prep - periwound Primary Wound Dressing Wound #1 Left Gluteal fold o Silver Alginate Wound #2 Right Gluteal fold o  Silver Alginate Wound #3 Left,Medial Upper Leg o Silver Alginate Secondary Dressing Wound #1 Left Gluteal fold o Boardered Foam Dressing [name], [name] ([id number]) Wound #2 Right Gluteal fold o Boardered Foam Dressing Wound #3 Left,Medial Upper Leg o Boardered Foam Dressing Dressing Change Frequency Wound #1 Left Gluteal fold o Change dressing every other day. Wound #2 Right Gluteal fold o Change dressing every other day. Wound #3 Left,Medial Upper Leg o Change dressing every other day. Follow-up Appointments Wound #1 Left Gluteal fold o Return Appointment in 1 week. Wound #2 Right Gluteal fold o Return Appointment in 1 week. Wound #3 Left,Medial Upper Leg o Return Appointment in 1 week. Off-Loading Wound #1 Left Gluteal fold o Turn and reposition every 2 hours o Other: - memory foam cushion while siting Wound #2 Right Gluteal fold o Turn and reposition every 2 hours o Other: - memory foam cushion while siting Wound #3 Left,Medial Upper Leg o Turn and reposition every 2 hours o Other: - memory foam cushion while siting Home Health Wound #1 Left Gluteal fold o [place] Visits - [place] Nurse may visit PRN to address patientos wound care needs. o FACE TO FACE ENCOUNTER: MEDICARE and MEDICAID PATIENTS: I certify that this patient is under my care and that I had a face-to-face encounter that meets the physician face-to-face encounter requirements with this patient on this date. The encounter with the patient was in whole or in part for the following MEDICAL CONDITION: (primary reason for [place]) MEDICAL NECESSITY: I certify, that based on my findings, NURSING services are a medically necessary home health service. HOME BOUND STATUS: I certify that my clinical findings support that this patient is homebound (i.e., Due to illness or injury, pt requires aid of supportive devices such as  crutches, cane, wheelchairs, walkers, the use of special transportation or the assistance of another person to leave their place of residence. There is a normal inability to leave the home and doing so requires considerable and  taxing effort. Other absences are for medical reasons / religious services and are infrequent or of short duration when for other reasons). [place], [name] ([id number]) o If current dressing causes regression in wound condition, may D/C ordered dressing product/s and apply Normal Saline Moist Dressing daily until next [place] / Other MD appointment. [place] of regression in wound condition at [phone]. o Please direct any NON-WOUND related issues/requests for orders to patient's Primary Care Physician Wound #2 Right Gluteal fold o [place] Visits - [place] Nurse may visit PRN to address patientos wound care needs. o FACE TO FACE ENCOUNTER: MEDICARE and MEDICAID PATIENTS: I certify that this patient is under my care and that I had a face-to-face encounter that meets the physician face-to-face encounter requirements with this patient on this date. The encounter with the patient was in whole or in part for the following MEDICAL CONDITION: (primary reason for [place]) MEDICAL NECESSITY: I certify, that based on my findings, NURSING services are a medically necessary home health service. HOME BOUND STATUS: I certify that my clinical findings support that this patient is homebound (i.e., Due to illness or injury, pt requires aid of supportive devices such as crutches, cane, wheelchairs, walkers, the use of special transportation or the assistance of another person to leave their place of residence. There is a normal inability to leave the home and doing so requires considerable and taxing effort. Other absences are for medical reasons / religious services and are infrequent or of short  duration when for other reasons). o If current dressing causes regression in wound condition, may D/C ordered dressing product/s and apply Normal Saline Moist Dressing daily until next [place] / Other MD appointment. [place] of regression in wound condition at [phone]. o Please direct any NON-WOUND related issues/requests for orders to patient's Primary Care Physician Wound #3 Left,Medial Upper Leg o [place] Visits - [place] Nurse may visit PRN to address patientos wound care needs. o FACE TO FACE ENCOUNTER: MEDICARE and MEDICAID PATIENTS: I certify that this patient is under my care and that I had a face-to-face encounter that meets the physician face-to-face encounter requirements with this patient on this date. The encounter with the patient was in whole or in part for the following MEDICAL CONDITION: (primary reason for [place]) MEDICAL NECESSITY: I certify, that based on my findings, NURSING services are a medically necessary home health service. HOME BOUND STATUS: I certify that my clinical findings support that this patient is homebound (i.e., Due to illness or injury, pt requires aid of supportive devices such as crutches, cane, wheelchairs, walkers, the use of special transportation or the assistance of another person to leave their place of residence. There is a normal inability to leave the home and doing so requires considerable and taxing effort. Other absences are for medical reasons / religious services and are infrequent or of short duration when for other reasons). o If current dressing causes regression in wound condition, may D/C ordered dressing product/s and apply Normal Saline Moist Dressing daily until next [place] / Other MD appointment. [place] of regression in wound condition at [phone]. o Please direct any NON-WOUND related  issues/requests for orders to patient's Primary Care Physician Electronic Signature(s) Signed: 11/29/2018 5:17:59 PM By: Montey Hora Signed: 11/29/2018 6:39:08 PM By: Worthy Keeler PA-C Entered By: Montey Hora on 11/29/2018 16:21:36 [name],  [name] ([id number]) -------------------------------------------------------------------------------- Problem List Details Patient Name: [name], [name] Date of Service: 11/29/2018 3:45 PM Medical Record Number: [id number] Patient Account Number: [id number] Date of Birth/Sex: 1950/04/10 (68 y.o. F) Treating RN: Cornell Barman Primary Care Provider: Harrel Lemon Other Clinician: Referring Provider: Harrel Lemon Treating Provider/Extender: Melburn Hake, HOYT Weeks in Treatment: 1 Active Problems ICD-10 Evaluated Encounter Code Description Active Date Today Diagnosis L24.89 Irritant contact dermatitis due to other agents 11/22/2018 No Yes F03.90 Unspecified dementia without behavioral disturbance 11/22/2018 No Yes Z94.0 Kidney transplant status 11/22/2018 No Yes I10 Essential (primary) hypertension 11/22/2018 No Yes Inactive Problems Resolved Problems Electronic Signature(s) Signed: 11/29/2018 3:59:40 PM By: Worthy Keeler PA-C Entered By: Worthy Keeler on 11/29/2018 15:59:40 [name], [name] ([id number]) -------------------------------------------------------------------------------- Progress Note Details Patient Name: [name] Date of Service: 11/29/2018 3:45 PM Medical Record Number: [id number] Patient Account Number: [id number] Date of Birth/Sex: [birth year] (68 y.o. F) Treating RN: Cornell Barman Primary Care Provider: Harrel Lemon Other Clinician: Referring Provider: Harrel Lemon Treating Provider/Extender: Melburn Hake, HOYT Weeks in Treatment: 1 Subjective Chief Complaint Information obtained from Patient Left gluteal fold ulcer History of Present Illness (HPI) 11/22/2018 patient presents today for initial evaluation in our  clinic concerning issues that she has been having with a wound in the left gluteal fold. This has been present since August and was noted initially when she was in the hospital. Fortunately there is no signs of active infection at this time. No fever chills noted. Up to this point DuoDERM has been utilized although I [name] not sure that is the best treatment option for her based on what I [name] seeing currently. This actually started as a result of moisture associated skin damage secondary to a C. difficile infection. The patient does have dementia. She is also status post having had a kidney transplant and does have hypertension as well. She does spend a lot of her time during the day sitting which is probably a part of why this is having trouble healing although it is not really a pressure ulcer that may be contributing to the lack of significant and rapid improvement. She still is having diarrhea as well and is still on treatment for the C. difficile infection she is on Dificid. They have been using barrier cream in the gluteal region to try to help protect the area where the dressing was not. 11/29/2018 upon evaluation today patient does not appear to be doing significantly better at this time. She still had some somewhat softer stools but not really as much diarrhea as what she had when the C. difficile infection was not full cycle. Overall things seem to be doing better in that regard although she does have 2 new areas on evaluation today. There is no signs of active infection. No fevers, chills, nausea, vomiting, or diarrhea. Patient History Unable to Obtain Patient History due to Dementia. Information obtained from Patient. Family History Heart Disease - Siblings, No family history of Cancer, Diabetes, Hypertension, Kidney Disease, Lung Disease, Seizures, Stroke, Thyroid Problems, Tuberculosis. Social History Former smoker - Quit 40+ years ago, Marital Status - Married, Alcohol Use - Never,  Drug Use - No History, Caffeine Use - Rarely. Medical History Endocrine Denies history of Type I Diabetes, Type II Diabetes Neurologic Patient has history of Dementia, Neuropathy - hands Oncologic Denies history of Received Chemotherapy, Received Radiation Hospitalization/Surgery History - [facility] UTI. [name], [name] ([id number]) Medical And Surgical History Notes Genitourinary Kidney Transpant 5+ years ago Review of Systems (ROS) Constitutional Symptoms ([place])  Denies complaints or symptoms of Fatigue, Fever, Chills, Marked Weight Change. Respiratory Denies complaints or symptoms of Chronic or frequent coughs, Shortness of Breath. Cardiovascular Denies complaints or symptoms of Chest pain, LE edema. Psychiatric Denies complaints or symptoms of Anxiety, Claustrophobia. Objective Constitutional Well-nourished and well-hydrated in no acute distress. Vitals Time Taken: 3:45 PM, Height: 59 in, Weight: 193 lbs, BMI: 39, Temperature: 98.0 F, Pulse: 66 bpm, Respiratory Rate: 18 breaths/min, Blood Pressure: 125/83 mmHg. Respiratory normal breathing without difficulty. clear to auscultation bilaterally. Cardiovascular regular rate and rhythm with normal S1, S2. Psychiatric this patient is able to make decisions and demonstrates good insight into disease process. Alert and Oriented x 3. pleasant and cooperative. General Notes: Upon inspection today patient's wound bed actually showed signs of granulation tissue although not significantly smaller. Unfortunately she does have 2 new areas as well upon evaluation today. This obviously is not good news and not the direction that were really wanting to head and as far as healing is concerned. With that being said there is no evidence of active infection at this time. Integumentary (Hair, Skin) Wound #1 status is Open. Original cause of wound was Gradually Appeared. The wound is located on the Left Gluteal fold. The wound measures  1.1cm length x 1.5cm width x 0.1cm depth; 1.296cm^2 area and 0.13cm^3 volume. There is Fat Layer (Subcutaneous Tissue) Exposed exposed. There is no tunneling or undermining noted. There is a medium amount of serous drainage noted. The wound margin is flat and intact. There is large (67-100%) red granulation within the wound bed. There is a small (1-33%) amount of necrotic tissue within the wound bed including Adherent Slough. Wound #2 status is Open. Original cause of wound was Gradually Appeared. The wound is located on the Right Gluteal fold. The wound measures 0.9cm length x 1.2cm width x 0.1cm depth; 0.848cm^2 area and 0.085cm^3 volume. There is Fat Layer (Subcutaneous Tissue) Exposed exposed. There is no tunneling or undermining noted. There is a medium amount of serous drainage noted. The wound margin is flat and intact. There is large (67-100%) pink granulation within the wound bed. There is a small (1-33%) amount of necrotic tissue within the wound bed including Adherent Slough. [place], Jammie ([id number]) Wound #3 status is Open. Original cause of wound was Gradually Appeared. The wound is located on the Left,Medial Upper Leg. The wound measures 0.5cm length x 1.2cm width x 0.1cm depth; 0.471cm^2 area and 0.047cm^3 volume. There is Fat Layer (Subcutaneous Tissue) Exposed exposed. There is no tunneling or undermining noted. There is a medium amount of serous drainage noted. The wound margin is flat and intact. There is large (67-100%) pink granulation within the wound bed. There is a small (1-33%) amount of necrotic tissue within the wound bed including Adherent Slough. Assessment Active Problems ICD-10 Irritant contact dermatitis due to other agents Unspecified dementia without behavioral disturbance Kidney transplant status Essential (primary) hypertension Plan Wound Cleansing: Wound #1 Left Gluteal fold: Clean wound with Normal Saline. - in office Cleanse wound with mild soap  and water Wound #2 Right Gluteal fold: Clean wound with Normal Saline. - in office Cleanse wound with mild soap and water Wound #3 Left,Medial Upper Leg: Clean wound with Normal Saline. - in office Cleanse wound with mild soap and water Skin Barriers/Peri-Wound Care: Wound #1 Left Gluteal fold: Skin Prep - periwound Wound #2 Right Gluteal fold: Skin Prep - periwound Wound #3 Left,Medial Upper Leg: Skin Prep - periwound Primary Wound Dressing: Wound #1 Left Gluteal fold: Silver  Alginate Wound #2 Right Gluteal fold: Silver Alginate Wound #3 Left,Medial Upper Leg: Silver Alginate Secondary Dressing: Wound #1 Left Gluteal fold: Boardered Foam Dressing Wound #2 Right Gluteal fold: Boardered Foam Dressing Wound #3 Left,Medial Upper Leg: Boardered Foam Dressing [place], Deletha ([id number]) Dressing Change Frequency: Wound #1 Left Gluteal fold: Change dressing every other day. Wound #2 Right Gluteal fold: Change dressing every other day. Wound #3 Left,Medial Upper Leg: Change dressing every other day. Follow-up Appointments: Wound #1 Left Gluteal fold: Return Appointment in 1 week. Wound #2 Right Gluteal fold: Return Appointment in 1 week. Wound #3 Left,Medial Upper Leg: Return Appointment in 1 week. Off-Loading: Wound #1 Left Gluteal fold: Turn and reposition every 2 hours Other: - memory foam cushion while siting Wound #2 Right Gluteal fold: Turn and reposition every 2 hours Other: - memory foam cushion while siting Wound #3 Left,Medial Upper Leg: Turn and reposition every 2 hours Other: - memory foam cushion while siting Home Health: Wound #1 Left Gluteal fold: Continue Home Health Visits - [place] Nurse may visit PRN to address patient s wound care needs. FACE TO FACE ENCOUNTER: MEDICARE and MEDICAID PATIENTS: I certify that this patient is under my care and that I had a face-to-face encounter that meets the physician face-to-face  encounter requirements with this patient on this date. The encounter with the patient was in whole or in part for the following MEDICAL CONDITION: (primary reason for [place]) MEDICAL NECESSITY: I certify, that based on my findings, NURSING services are a medically necessary home health service. HOME BOUND STATUS: I certify that my clinical findings support that this patient is homebound (i.e., Due to illness or injury, pt requires aid of supportive devices such as crutches, cane, wheelchairs, walkers, the use of special transportation or the assistance of another person to leave their place of residence. There is a normal inability to leave the home and doing so requires considerable and taxing effort. Other absences are for medical reasons / religious services and are infrequent or of short duration when for other reasons). If current dressing causes regression in wound condition, may D/C ordered dressing product/s and apply Normal Saline Moist Dressing daily until next [place] / Other MD appointment. [place] of regression in wound condition at [phone]. Please direct any NON-WOUND related issues/requests for orders to patient's Primary Care Physician Wound #2 Right Gluteal fold: [place] Nurse may visit PRN to address patient s wound care needs. FACE TO FACE ENCOUNTER: MEDICARE and MEDICAID PATIENTS: I certify that this patient is under my care and that I had a face-to-face encounter that meets the physician face-to-face encounter requirements with this patient on this date. The encounter with the patient was in whole or in part for the following MEDICAL CONDITION: (primary reason for [place]) MEDICAL NECESSITY: I certify, that based on my findings, NURSING services are a medically necessary home health service. HOME BOUND STATUS: I certify that my clinical findings support that this  patient is homebound (i.e., Due to illness or injury, pt requires aid of supportive devices such as crutches, cane, wheelchairs, walkers, the use of special transportation or the assistance of another person to leave their place of residence. There is a normal inability to leave the home and doing so requires considerable and taxing effort. Other absences are for medical reasons / religious services and are infrequent or of short duration when for other reasons). If  current dressing causes regression in wound condition, may D/C ordered dressing product/s and apply Normal Saline Moist Dressing daily until next [place] / Other MD appointment. [place] of regression in wound condition at [phone]. Please direct any NON-WOUND related issues/requests for orders to patient's Primary Care Physician Wound #3 Left,Medial Upper Leg: [place] Visits - [place] Nurse may visit PRN to address patient s wound care needs. [name], [name] ([id number]) FACE TO FACE ENCOUNTER: MEDICARE and MEDICAID PATIENTS: I certify that this patient is under my care and that I had a face-to-face encounter that meets the physician face-to-face encounter requirements with this patient on this date. The encounter with the patient was in whole or in part for the following MEDICAL CONDITION: (primary reason for [place]) MEDICAL NECESSITY: I certify, that based on my findings, NURSING services are a medically necessary home health service. HOME BOUND STATUS: I certify that my clinical findings support that this patient is homebound (i.e., Due to illness or injury, pt requires aid of supportive devices such as crutches, cane, wheelchairs, walkers, the use of special transportation or the assistance of another person to leave their place of residence. There is a normal inability to leave the home and doing so requires considerable and taxing effort. Other  absences are for medical reasons / religious services and are infrequent or of short duration when for other reasons). If current dressing causes regression in wound condition, may D/C ordered dressing product/s and apply Normal Saline Moist Dressing daily until next [place] / Other MD appointment. [place] of regression in wound condition at [phone]. Please direct any NON-WOUND related issues/requests for orders to patient's Primary Care Physician 1. My suggestion currently is going to be that we go ahead and continue with the silver alginate dressing will get this little bit more time to see how things do. The patient is in agreement with that plan. 2. I [name] also going to suggest that we go ahead and have the patient purchase a piece of memory foam cushion to use while sitting I think this can help with controlling the pressure to her gluteal region which may be leading to some of the sores as well. 3. I do think she needs to have her breathe changed on a regular basis in order to prevent any moisture buildup which could also worsen the situation in general. We will see patient back for reevaluation in 1 week here in the clinic. If anything worsens or changes patient will contact our office for additional recommendations. Electronic Signature(s) Signed: 11/29/2018 6:13:16 PM By: Worthy Keeler PA-C Entered By: Worthy Keeler on 11/29/2018 18:13:16 [name], [name] ([id number]) -------------------------------------------------------------------------------- ROS/PFSH Details Patient Name: [name] Date of Service: 11/29/2018 3:45 PM Medical Record Number: [id number] Patient Account Number: [id number] Date of Birth/Sex: [birth year] (68 y.o. F) Treating RN: Cornell Barman Primary Care Provider: Harrel Lemon Other Clinician: Referring Provider: Harrel Lemon Treating Provider/Extender: Melburn Hake, HOYT Weeks in Treatment: 1 Unable to Obtain Patient  History due to oo Dementia Information Obtained From Patient Constitutional Symptoms (General Health) Complaints and Symptoms: Negative for: Fatigue; Fever; Chills; Marked Weight Change Respiratory Complaints and Symptoms: Negative for: Chronic or frequent coughs; Shortness of Breath Cardiovascular Complaints and Symptoms: Negative for: Chest pain; LE edema Psychiatric Complaints and Symptoms: Negative for: Anxiety; Claustrophobia Endocrine Medical History: Negative for: Type I Diabetes; Type II Diabetes Genitourinary Medical History: Past Medical History Notes: Kidney Transpant 5+ years  ago Neurologic Medical History: Positive for: Dementia; Neuropathy - hands Oncologic Medical History: Negative for: Received Chemotherapy; Received Radiation Immunizations Pneumococcal Vaccine: Received Pneumococcal Vaccination: Yes [name], AMARAL ([id number]) Implantable Devices Yes Hospitalization / Surgery History Type of Hospitalization/Surgery [facility] UTI Family and Social History Cancer: No; Diabetes: No; Heart Disease: Yes - Siblings; Hypertension: No; Kidney Disease: No; Lung Disease: No; Seizures: No; Stroke: No; Thyroid Problems: No; Tuberculosis: No; Former smoker - Quit 40+ years ago; Marital Status - Married; Alcohol Use: Never; Drug Use: No History; Caffeine Use: Rarely Physician Affirmation I have reviewed and agree with the above information. Electronic Signature(s) Signed: 11/29/2018 5:55:45 PM By: Gretta Cool, BSN, RN, CWS, Kim RN, BSN Signed: 11/29/2018 6:39:08 PM By: Worthy Keeler PA-C Entered By: Worthy Keeler on 11/29/2018 16:24:35 CLINT, STRUPP ([id number]) -------------------------------------------------------------------------------- [place] Details Patient Name: [name] Date of Service: 11/29/2018 Medical Record Number: [id number] Patient Account Number: [id number] Date of Birth/Sex: [birth year] (68 y.o. F) Treating RN: Cornell Barman Primary Care  Provider: Harrel Lemon Other Clinician: Referring Provider: Harrel Lemon Treating Provider/Extender: Melburn Hake, HOYT Weeks in Treatment: 1 Diagnosis Coding ICD-10 Codes Code Description L24.89 Irritant contact dermatitis due to other agents F03.90 Unspecified dementia without behavioral disturbance Z94.0 Kidney transplant status I10 Essential (primary) hypertension Facility Procedures CPT4 Code: [id number] Description: 99214 - WOUND CARE VISIT-LEV 4 EST PT Modifier: Quantity: 1 Physician Procedures CPT4 Code: 7591638 Description: 46659 - WC PHYS LEVEL 4 - EST PT ICD-10 Diagnosis Description L24.89 Irritant contact dermatitis due to other agents F03.90 Unspecified dementia without behavioral disturbance Z94.0 Kidney transplant status I10 Essential (primary)  hypertension Modifier: Quantity: 1 Electronic Signature(s) Signed: 11/29/2018 6:13:28 PM By: Worthy Keeler PA-C Entered By: Worthy Keeler on 11/29/2018 18:13:28

## 2018-11-29 NOTE — Progress Notes (Signed)
MAYMUNA, DETZEL ([id number]) Visit Report for 11/29/2018 Arrival Information Details Patient Name: [name], [name] Date of Service: 11/29/2018 3:45 PM Medical Record Number: [id number] Patient Account Number: [id number] Date of Birth/Sex: [birth year] (68 y.o. F) Treating RN: Montey Hora Primary Care Trevan Messman: Harrel Lemon Other Clinician: Referring Brown Dunlap: Harrel Lemon Treating Maciah Feeback/Extender: Melburn Hake, HOYT Weeks in Treatment: 1 Visit Information History Since Last Visit Added or deleted any medications: No Patient Arrived: Ambulatory Any new allergies or adverse reactions: No Arrival Time: 15:44 Had a fall or experienced change in No Accompanied By: daughter activities of daily living that may affect Transfer Assistance: None risk of falls: Patient Identification Verified: Yes Signs or symptoms of abuse/neglect since last visito No Secondary Verification Process Completed: Yes Hospitalized since last visit: No Patient Requires Transmission-Based No Implantable device outside of the clinic excluding No Precautions: cellular tissue based products placed in the center Patient Has Alerts: Yes since last visit: Patient Alerts: NOT Has Dressing in Place as Prescribed: Yes Diabetic Pain Present Now: No Electronic Signature(s) Signed: 11/29/2018 5:17:59 PM By: Montey Hora Entered By: Montey Hora on 11/29/2018 15:44:40 [name] ([id number]) -------------------------------------------------------------------------------- Clinic Level of Care Assessment Details Patient Name: [name] Date of Service: 11/29/2018 3:45 PM Medical Record Number: [id number] Patient Account Number: [id number] Date of Birth/Sex: 10/04/1966 (68 y.o. F) Treating RN: Montey Hora Primary Care Adisa Litt: Harrel Lemon Other Clinician: Referring Jaslynne Dahan: Harrel Lemon Treating Willia Genrich/Extender: Melburn Hake, HOYT Weeks in Treatment: 1 Clinic Level of Care Assessment  Items TOOL 4 Quantity Score []  - Use when only an EandM is performed on FOLLOW-UP visit 0 ASSESSMENTS - Nursing Assessment / Reassessment X - Reassessment of Co-morbidities (includes updates in patient status) 1 10 X- 1 5 Reassessment of Adherence to Treatment Plan ASSESSMENTS - Wound and Skin Assessment / Reassessment []  - Simple Wound Assessment / Reassessment - one wound 0 X- 3 5 Complex Wound Assessment / Reassessment - multiple wounds []  - 0 Dermatologic / Skin Assessment (not related to wound area) ASSESSMENTS - Focused Assessment []  - Circumferential Edema Measurements - multi extremities 0 []  - 0 Nutritional Assessment / Counseling / Intervention []  - 0 Lower Extremity Assessment (monofilament, tuning fork, pulses) []  - 0 Peripheral Arterial Disease Assessment (using hand held doppler) ASSESSMENTS - Ostomy and/or Continence Assessment and Care []  - Incontinence Assessment and Management 0 []  - 0 Ostomy Care Assessment and Management (repouching, etc.) PROCESS - Coordination of Care X - Simple Patient / Family Education for ongoing care 1 15 []  - 0 Complex (extensive) Patient / Family Education for ongoing care X- 1 10 Staff obtains Programmer, systems, Records, Test Results / Process Orders []  - 0 Staff telephones HHA, Nursing Homes / Clarify orders / etc []  - 0 Routine Transfer to another Facility (non-emergent condition) []  - 0 Routine Hospital Admission (non-emergent condition) []  - 0 New Admissions / Biomedical engineer / Ordering NPWT, Apligraf, etc. []  - 0 Emergency Hospital Admission (emergent condition) X- 1 10 Simple Discharge Coordination [place], Azriel ([id number]) []  - 0 Complex (extensive) Discharge Coordination PROCESS - Special Needs []  - Pediatric / Minor Patient Management 0 []  - 0 Isolation Patient Management []  - 0 Hearing / Language / Visual special needs []  - 0 Assessment of Community assistance (transportation, D/C planning, etc.) []  -  0 Additional assistance / Altered mentation []  - 0 Support Surface(s) Assessment (bed, cushion, seat, etc.) INTERVENTIONS - Wound Cleansing / Measurement []  - Simple Wound Cleansing - one wound 0 X- 3 5 Complex Wound Cleansing - multiple  wounds X- 1 5 Wound Imaging (photographs - any number of wounds) []  - 0 Wound Tracing (instead of photographs) []  - 0 Simple Wound Measurement - one wound X- 3 5 Complex Wound Measurement - multiple wounds INTERVENTIONS - Wound Dressings []  - Small Wound Dressing one or multiple wounds 0 X- 3 15 Medium Wound Dressing one or multiple wounds X- 0 20 Large Wound Dressing one or multiple wounds []  - 0 Application of Medications - topical []  - 0 Application of Medications - injection INTERVENTIONS - Miscellaneous []  - External ear exam 0 []  - 0 Specimen Collection (cultures, biopsies, blood, body fluids, etc.) []  - 0 Specimen(s) / Culture(s) sent or taken to Lab for analysis []  - 0 Patient Transfer (multiple staff / Civil Service fast streamer / Similar devices) []  - 0 Simple Staple / Suture removal (25 or less) []  - 0 Complex Staple / Suture removal (26 or more) []  - 0 Hypo / Hyperglycemic Management (close monitor of Blood Glucose) []  - 0 Ankle / Brachial Index (ABI) - do not check if billed separately X- 1 5 Vital Signs Scholle, Jozelynn ([id number]) Has the patient been seen at the hospital within the last three years: Yes Total Score: 150 Level Of Care: New/Established - Level 4 Electronic Signature(s) Signed: 11/29/2018 5:17:59 PM By: Montey Hora Entered By: Montey Hora on 11/29/2018 16:22:33 [name] ([id number]) -------------------------------------------------------------------------------- Encounter Discharge Information Details Patient Name: [name] Date of Service: 11/29/2018 3:45 PM Medical Record Number: [id number] Patient Account Number: [id number] Date of Birth/Sex: [birth year] (68 y.o. F) Treating RN: Montey Hora Primary Care Yaziel Brandon: Harrel Lemon Other Clinician: Referring Plato Alspaugh: Harrel Lemon Treating Anel Purohit/Extender: Melburn Hake, HOYT Weeks in Treatment: 1 Encounter Discharge Information Items Discharge Condition: Stable Ambulatory Status: Wheelchair Discharge Destination: Home Transportation: Private Auto Accompanied By: caregiver Schedule Follow-up Appointment: Yes Clinical Summary of Care: Electronic Signature(s) Signed: 11/29/2018 5:17:59 PM By: Montey Hora Entered By: Montey Hora on 11/29/2018 16:26:10 [place] ([id number]) -------------------------------------------------------------------------------- Lower Extremity Assessment Details Patient Name: [name] Date of Service: 11/29/2018 3:45 PM Medical Record Number: [id number] Patient Account Number: [id number] Date of Birth/Sex: 1950/03/14 (68 y.o. F) Treating RN: Montey Hora Primary Care Vashon Arch: Harrel Lemon Other Clinician: Referring Arshi Duarte: Harrel Lemon Treating Zair Borawski/Extender: Melburn Hake, HOYT Weeks in Treatment: 1 Electronic Signature(s) Signed: 11/29/2018 5:17:59 PM By: Montey Hora Entered By: Montey Hora on 11/29/2018 15:47:18 [place], [name] ([id number]) -------------------------------------------------------------------------------- Multi Wound Chart Details Patient Name: [name] Date of Service: 11/29/2018 3:45 PM Medical Record Number: [id number] Patient Account Number: [id number] Date of Birth/Sex: 1950-05-01 (68 y.o. 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] [3:Incontinence Associated Dermatitis (IAD)] Secondary Etiology: [1:Pressure Ulcer] [2:N/A] [3:N/A] Comorbid History: [1:Dementia, Neuropathy] [2:Dementia, Neuropathy] [3:Dementia, Neuropathy] Date Acquired: [1:10/16/2018] [2:11/29/2018] [3:11/29/2018] Weeks of Treatment: [1:1] [2:0] [3:0] Wound Status: [1:Open] [2:Open] [3:Open] Measurements L x W x D [1:1.1x1.5x0.1] [2:0.9x1.2x0.1] [3:0.5x1.2x0.1] (cm) Area (cm) : [1:1.296] [2:0.848] [3:0.471] Volume (cm) : [1:0.13] [2:0.085] [3:0.047] % Reduction in Area: [1:42.10%] [2:N/A] [3:N/A] % Reduction in Volume: [1:42.00%] [2:N/A] [3:N/A] Classification: [1:Full Thickness Without Exposed Support Structures] [2:Full Thickness Without Exposed  Support Structures] [3:Full Thickness Without Exposed Support Structures] Exudate Amount: [1:Medium] [2:Medium] [3:Medium] Exudate Type: [1:Serous] [2:Serous] [3:Serous] Exudate Color: [1:amber] [2:amber] [3:amber] Wound Margin: [1:Flat and Intact] [2:Flat and Intact] [3:Flat and Intact] Granulation Amount: [1:Large (67-100%)] [2:Large (67-100%)] [3:Large (67-100%)] Granulation Quality: [1:Red] [2:Pink] [3:Pink] Necrotic Amount: [1:Small (1-33%)] [2:Small (1-33%)] [3:Small (1-33%)] Exposed Structures: [1:Fat Layer (Subcutaneous Tissue) Exposed: Yes Fascia: No Tendon: No Muscle: No Joint: No Bone: No None] [2:Fat Layer (Subcutaneous Tissue) Exposed: Yes Fascia: No Tendon: No Muscle: No Joint: No Bone: No None] [3:Fat Layer (Subcutaneous Tissue)  Exposed: Yes Fascia: No Tendon: No Muscle: No Joint: No Bone: No None] Treatment Notes SHANORA, CHRISTENSEN ([id number]) Electronic Signature(s) Signed: 11/29/2018 5:17:59 PM By: Montey Hora Entered By: Montey Hora on 11/29/2018 16:17:27 [name] ([id number]) -------------------------------------------------------------------------------- Multi-Disciplinary Care Plan Details Patient Name: [name] Date of Service: 11/29/2018 3:45 PM Medical Record Number: [id number] Patient Account Number: [id number] Date of Birth/Sex: 1951/02/02 (68 y.o. F) Treating RN: Montey Hora Primary Care Mahkayla Preece: Harrel Lemon Other Clinician: Referring Alishah Schulte: Harrel Lemon Treating Hanad Leino/Extender: Melburn Hake, HOYT Weeks in Treatment: 1 Active Inactive Abuse / Safety / Falls / Self Care Management Nursing Diagnoses: Potential for falls Goals: Patient/caregiver will demonstrate safe use of adaptive devices to increase mobility Date Initiated: 11/22/2018 Target Resolution Date: 12/21/2018 Goal Status: Active Interventions: Assess: immobility, friction, shearing, incontinence upon admission and as needed Notes: Orientation to the Wound Care Program Nursing Diagnoses: Knowledge deficit related to the wound healing center program Goals: Patient/caregiver will verbalize understanding of the [facility] [place] Program Date Initiated: 11/22/2018 Target Resolution Date: 12/21/2018 Goal Status: Active Interventions: Provide education on orientation to the wound center Notes: Pressure Nursing Diagnoses: Knowledge deficit related to management of pressures ulcers Goals: Patient/caregiver will verbalize risk factors for pressure ulcer development Date Initiated: 11/22/2018 Target Resolution Date: 12/21/2018 Goal Status: Active Interventions: Assess: immobility, friction, shearing, incontinence upon admission and as needed ARDEN, AXON ([id number]) Notes: Wound/Skin Impairment Nursing Diagnoses: Impaired tissue integrity Goals: Ulcer/skin breakdown will have a volume reduction of 30% by week 4 Date Initiated: 11/22/2018 Target Resolution Date: 12/21/2018 Goal Status: Active Interventions: Assess ulceration(s) every  visit Notes: Electronic Signature(s) Signed: 11/29/2018 5:17:59 PM By: Montey Hora Entered By: Montey Hora on 11/29/2018 16:17:12 [name] ([id number]) -------------------------------------------------------------------------------- Pain Assessment Details Patient Name: [name] Date of Service: 11/29/2018 3:45 PM Medical Record Number: [id number] Patient Account Number: [id number] Date of Birth/Sex: 1950/02/21 (68 y.o. F) Treating RN: Montey Hora Primary Care Khyren Hing: Harrel Lemon Other Clinician: Referring Kess Mcilwain: Harrel Lemon Treating Mikenzi Raysor/Extender: Melburn Hake, HOYT Weeks in Treatment: 1 Active Problems Location of Pain Severity and Description of Pain Patient Has Paino Yes Site Locations Pain Location: Pain in Ulcers Pain Management and Medication Current Pain Management: Electronic Signature(s) Signed: 11/29/2018 5:17:59 PM By: Montey Hora Entered By: Montey Hora on 11/29/2018 15:44:55 [name] ([id number]) -------------------------------------------------------------------------------- Patient/Caregiver Education Details Patient Name: [name] Date of Service: 11/29/2018 3:45 PM Medical Record Number: [id number] Patient Account Number: [id number] Date of Birth/Gender: May 25, 1950 (68 y.o. F) Treating RN: Montey Hora Primary Care Physician: Harrel Lemon Other Clinician: Referring Physician: Harrel Lemon Treating Physician/Extender: Sharalyn Ink in Treatment: 1 Education Assessment Education Provided To: Patient Education Topics Provided Electronic Signature(s) Signed: 11/29/2018 5:17:59 PM By: Montey Hora Entered By: Montey Hora on 11/29/2018 16:22:44 SHAHIRA, FISKE ([id number]) -------------------------------------------------------------------------------- Wound Assessment Details Patient Name: [name] Date of Service: 11/29/2018 3:45 PM Medical Record Number:  [id number] Patient Account Number: [id number] Date of Birth/Sex: May 02, 1950 (68 y.o. F) Treating  RN: Montey Hora Primary Care Arseniy Toomey: Harrel Lemon Other Clinician: Referring Nanna Ertle: Harrel Lemon Treating Avish Torry/Extender: Melburn Hake, HOYT Weeks in Treatment: 1 Wound Status Wound Number: 1 Primary Etiology: Incontinence Associated Dermatitis (IAD) Wound Location: Left Gluteal fold Secondary Etiology: Pressure Ulcer Wounding Event: Gradually Appeared Wound Status: Open Date Acquired: 10/16/2018 Comorbid History: Dementia, Neuropathy Weeks Of Treatment: 1 Clustered Wound: No Wound Measurements Length: (cm) 1.1 Width: (cm) 1.5 Depth: (cm) 0.1 Area: (cm) 1.296 Volume: (cm) 0.13 % Reduction in Area: 42.1% % Reduction in Volume: 42% Epithelialization: None Tunneling: No Undermining: No Wound Description Full Thickness Without Exposed Support Classification: Structures Wound Margin: Flat and Intact Exudate Medium Amount: Exudate Type: Serous Exudate Color: amber Foul Odor After Cleansing: No Slough/Fibrino Yes Wound Bed Granulation Amount: Large (67-100%) Exposed Structure Granulation Quality: Red Fascia Exposed: No Necrotic Amount: Small (1-33%) Fat Layer (Subcutaneous Tissue) Exposed: Yes Necrotic Quality: Adherent Slough Tendon Exposed: No Muscle Exposed: No Joint Exposed: No Bone Exposed: No Treatment Notes Wound #1 (Left Gluteal fold) Notes Silver alginate, BFD Electronic Signature(s) Signed: 11/29/2018 5:17:59 PM By: Montey Hora Entered By: Montey Hora on 11/29/2018 15:56:26 [place], [name] ([id number]) -------------------------------------------------------------------------------- Wound Assessment Details Patient Name: [name] Date of Service: 11/29/2018 3:45 PM Medical Record Number: [id number] Patient Account Number: [id number] Date of Birth/Sex: [birth year] (68 y.o. F) Treating RN: Montey Hora Primary Care Graceyn Fodor:  Harrel Lemon Other Clinician: Referring Debbera Wolken: Harrel Lemon Treating Miyana Mordecai/Extender: Melburn Hake, HOYT Weeks in Treatment: 1 Wound Status Wound Number: 2 Primary Etiology: MASD Wound Location: Right Gluteal fold Wound Status: Open Wounding Event: Gradually Appeared Comorbid History: Dementia, Neuropathy Date Acquired: 11/29/2018 Weeks Of Treatment: 0 Clustered Wound: No Wound Measurements Length: (cm) 0.9 Width: (cm) 1.2 Depth: (cm) 0.1 Area: (cm) 0.848 Volume: (cm) 0.085 % Reduction in Area: % Reduction in Volume: Epithelialization: None Tunneling: No Undermining: No Wound Description Full Thickness Without Exposed Support Foul O Classification: Structures Slough Wound Margin: Flat and Intact Exudate Medium Amount: Exudate Type: Serous Exudate Color: amber dor After Cleansing: No /Fibrino Yes Wound Bed Granulation Amount: Large (67-100%) Exposed Structure Granulation Quality: Pink Fascia Exposed: No Necrotic Amount: Small (1-33%) Fat Layer (Subcutaneous Tissue) Exposed: Yes Necrotic Quality: Adherent Slough Tendon Exposed: No Muscle Exposed: No Joint Exposed: No Bone Exposed: No Electronic Signature(s) Signed: 11/29/2018 5:17:59 PM By: Montey Hora Entered By: Montey Hora on 11/29/2018 15:56:17 [place], [name] ([id number]) -------------------------------------------------------------------------------- Wound Assessment Details Patient Name: [name] Date of Service: 11/29/2018 3:45 PM Medical Record Number: [id number] Patient Account Number: [id number] Date of Birth/Sex: Jul 05, 1950 (68 y.o. F) Treating RN: Montey Hora Primary Care Tere Mcconaughey: Harrel Lemon Other Clinician: Referring Jasyah Theurer: Harrel Lemon Treating Evangaline Jou/Extender: Melburn Hake, HOYT Weeks in Treatment: 1 Wound Status Wound Number: 3 Primary Etiology: Incontinence Associated Dermatitis (IAD) Wound Location: Left Upper Leg - Medial Wound Status: Open Wounding  Event: Gradually Appeared Comorbid History: Dementia, Neuropathy Date Acquired: 11/29/2018 Weeks Of Treatment: 0 Clustered Wound: No Wound Measurements Length: (cm) 0.5 Width: (cm) 1.2 Depth: (cm) 0.1 Area: (cm) 0.471 Volume: (cm) 0.047 % Reduction in Area: % Reduction in Volume: Epithelialization: None Tunneling: No Undermining: No Wound Description Full Thickness Without Exposed Support Classification: Structures Wound Margin: Flat and Intact Exudate Medium Amount: Exudate Type: Serous Exudate Color: amber Foul Odor After Cleansing: No Slough/Fibrino Yes Wound Bed Granulation Amount: Large (67-100%) Exposed Structure Granulation Quality: Pink Fascia Exposed: No Necrotic Amount: Small (1-33%) Fat Layer (Subcutaneous Tissue) Exposed: Yes Necrotic Quality: Adherent Slough Tendon Exposed: No Muscle Exposed: No Joint Exposed: No Bone Exposed:  No Treatment Notes Wound #3 (Left, Medial Upper Leg) Notes Silver alginate, BFD Electronic Signature(s) Signed: 11/29/2018 5:17:59 PM By: Montey Hora Entered By: Montey Hora on 11/29/2018 15:58:23 [place], [name] ([id number]) -------------------------------------------------------------------------------- [place] Details Patient Name: [name] Date of Service: 11/29/2018 3:45 PM Medical Record Number: [id number] Patient Account Number: [id number] Date of Birth/Sex: 09-Apr-1950 (68 y.o. F) Treating RN: Montey Hora Primary Care Treon Kehl: Harrel Lemon Other Clinician: Referring Molly Maselli: Harrel Lemon Treating Lonnette Shrode/Extender: Melburn Hake, HOYT Weeks in Treatment: 1 Vital Signs Time Taken: 15:45 Temperature (F): 98.0 Height (in): 59 Pulse (bpm): 66 Weight (lbs): 193 Respiratory Rate (breaths/min): 18 Body Mass Index (BMI): 39 Blood Pressure (mmHg): 125/83 Reference Range: 80 - 120 mg / dl Electronic Signature(s) Signed: 11/29/2018 5:17:59 PM By: Montey Hora Entered By: Montey Hora on  11/29/2018 15:46:45

## 2018-11-30 ENCOUNTER — Other Ambulatory Visit: Payer: Self-pay

## 2018-11-30 ENCOUNTER — Emergency Department: Payer: Medicare Other

## 2018-11-30 ENCOUNTER — Other Ambulatory Visit: Payer: Medicare Other

## 2018-11-30 ENCOUNTER — Inpatient Hospital Stay
Admission: EM | Admit: 2018-11-30 | Discharge: 2018-12-02 | DRG: 689 | Disposition: A | Discharge status: Home Health | Payer: Medicare Other | Attending: Internal Medicine | Admitting: Internal Medicine

## 2018-11-30 DIAGNOSIS — Z905 Acquired absence of kidney: Secondary | ICD-10-CM | POA: Diagnosis not present

## 2018-11-30 DIAGNOSIS — N2581 Secondary hyperparathyroidism of renal origin: Secondary | ICD-10-CM | POA: Diagnosis present

## 2018-11-30 DIAGNOSIS — T8619 Other complication of kidney transplant: Secondary | ICD-10-CM | POA: Diagnosis present

## 2018-11-30 DIAGNOSIS — N390 Urinary tract infection, site not specified: Principal | ICD-10-CM | POA: Diagnosis present

## 2018-11-30 DIAGNOSIS — Z888 Allergy status to other drugs, medicaments and biological substances status: Secondary | ICD-10-CM

## 2018-11-30 DIAGNOSIS — Z91040 Latex allergy status: Secondary | ICD-10-CM

## 2018-11-30 DIAGNOSIS — I120 Hypertensive chronic kidney disease with stage 5 chronic kidney disease or end stage renal disease: Secondary | ICD-10-CM | POA: Diagnosis present

## 2018-11-30 DIAGNOSIS — F0390 Unspecified dementia without behavioral disturbance: Secondary | ICD-10-CM | POA: Diagnosis present

## 2018-11-30 DIAGNOSIS — G9341 Metabolic encephalopathy: Secondary | ICD-10-CM | POA: Diagnosis present

## 2018-11-30 DIAGNOSIS — R4182 Altered mental status, unspecified: Secondary | ICD-10-CM | POA: Diagnosis not present

## 2018-11-30 DIAGNOSIS — Z87440 Personal history of urinary (tract) infections: Secondary | ICD-10-CM

## 2018-11-30 DIAGNOSIS — Z20828 Contact with and (suspected) exposure to other viral communicable diseases: Secondary | ICD-10-CM | POA: Diagnosis present

## 2018-11-30 DIAGNOSIS — Z8542 Personal history of malignant neoplasm of other parts of uterus: Secondary | ICD-10-CM | POA: Diagnosis not present

## 2018-11-30 DIAGNOSIS — N179 Acute kidney failure, unspecified: Secondary | ICD-10-CM | POA: Diagnosis present

## 2018-11-30 DIAGNOSIS — Z79899 Other long term (current) drug therapy: Secondary | ICD-10-CM

## 2018-11-30 DIAGNOSIS — Z87891 Personal history of nicotine dependence: Secondary | ICD-10-CM | POA: Diagnosis not present

## 2018-11-30 DIAGNOSIS — Y830 Surgical operation with transplant of whole organ as the cause of abnormal reaction of the patient, or of later complication, without mention of misadventure at the time of the procedure: Secondary | ICD-10-CM | POA: Diagnosis present

## 2018-11-30 DIAGNOSIS — N186 End stage renal disease: Secondary | ICD-10-CM | POA: Diagnosis present

## 2018-11-30 DIAGNOSIS — R253 Fasciculation: Secondary | ICD-10-CM

## 2018-11-30 DIAGNOSIS — Z85528 Personal history of other malignant neoplasm of kidney: Secondary | ICD-10-CM

## 2018-11-30 DIAGNOSIS — K219 Gastro-esophageal reflux disease without esophagitis: Secondary | ICD-10-CM | POA: Diagnosis present

## 2018-11-30 DIAGNOSIS — Z791 Long term (current) use of non-steroidal anti-inflammatories (NSAID): Secondary | ICD-10-CM

## 2018-11-30 DIAGNOSIS — N3001 Acute cystitis with hematuria: Secondary | ICD-10-CM

## 2018-11-30 DIAGNOSIS — D631 Anemia in chronic kidney disease: Secondary | ICD-10-CM | POA: Diagnosis present

## 2018-11-30 LAB — CBC WITH DIFFERENTIAL/PLATELET
Abs Immature Granulocytes: 0.01 10*3/uL (ref 0.00–0.07)
Basophils Absolute: 0 10*3/uL (ref 0.0–0.1)
Basophils Relative: 1 %
Eosinophils Absolute: 0.2 10*3/uL (ref 0.0–0.5)
Eosinophils Relative: 4 %
HCT: 28 % — ABNORMAL LOW (ref 36.0–46.0)
Hemoglobin: 8.6 g/dL — ABNORMAL LOW (ref 12.0–15.0)
Immature Granulocytes: 0 %
Lymphocytes Relative: 24 %
Lymphs Abs: 0.9 10*3/uL (ref 0.7–4.0)
MCH: 24.9 pg — ABNORMAL LOW (ref 26.0–34.0)
MCHC: 30.7 g/dL (ref 30.0–36.0)
MCV: 80.9 fL (ref 80.0–100.0)
Monocytes Absolute: 0.5 10*3/uL (ref 0.1–1.0)
Monocytes Relative: 11 %
Neutro Abs: 2.4 10*3/uL (ref 1.7–7.7)
Neutrophils Relative %: 60 %
Platelets: 171 10*3/uL (ref 150–400)
RBC: 3.46 MIL/uL — ABNORMAL LOW (ref 3.87–5.11)
RDW: 16.3 % — ABNORMAL HIGH (ref 11.5–15.5)
WBC: 3.9 10*3/uL — ABNORMAL LOW (ref 4.0–10.5)
nRBC: 0 % (ref 0.0–0.2)

## 2018-11-30 LAB — COMPREHENSIVE METABOLIC PANEL
ALT: 12 U/L (ref 0–44)
AST: 14 U/L — ABNORMAL LOW (ref 15–41)
Albumin: 3.5 g/dL (ref 3.5–5.0)
Alkaline Phosphatase: 45 U/L (ref 38–126)
Anion gap: 10 (ref 5–15)
BUN: 43 mg/dL — ABNORMAL HIGH (ref 8–23)
CO2: 26 mmol/L (ref 22–32)
Calcium: 8.7 mg/dL — ABNORMAL LOW (ref 8.9–10.3)
Chloride: 105 mmol/L (ref 98–111)
Creatinine, Ser: 2.45 mg/dL — ABNORMAL HIGH (ref 0.44–1.00)
GFR calc Af Amer: 23 mL/min — ABNORMAL LOW (ref >60)
GFR calc non Af Amer: 20 mL/min — ABNORMAL LOW (ref >60)
Glucose, Bld: 98 mg/dL (ref 70–99)
Potassium: 4.1 mmol/L (ref 3.5–5.1)
Sodium: 141 mmol/L (ref 135–145)
Total Bilirubin: 0.5 mg/dL (ref 0.3–1.2)
Total Protein: 7 g/dL (ref 6.5–8.1)

## 2018-11-30 LAB — LACTIC ACID, PLASMA: Lactic Acid, Venous: 0.9 mmol/L (ref 0.5–1.9)

## 2018-11-30 LAB — URINALYSIS, ROUTINE W REFLEX MICROSCOPIC
Bacteria, UA: NONE SEEN
Bilirubin Urine: NEGATIVE
Glucose, UA: NEGATIVE mg/dL
Ketones, ur: NEGATIVE mg/dL
Nitrite: NEGATIVE
Protein, ur: 100 mg/dL — AB
RBC / HPF: >50 RBC/hpf — ABNORMAL HIGH (ref 0–5)
Specific Gravity, Urine: 1.013 (ref 1.005–1.030)
WBC, UA: >50 WBC/hpf — ABNORMAL HIGH (ref 0–5)
pH: 5 (ref 5.0–8.0)

## 2018-11-30 MED ORDER — METOPROLOL TARTRATE 25 MG PO TABS
25.0000 mg | ORAL_TABLET | Freq: Two times a day (BID) | ORAL | Status: DC
  Administered 2018-11-30 – 2018-12-01 (×3): 25 mg via ORAL
  Filled 2018-11-30 (×4): qty 1

## 2018-11-30 MED ORDER — SODIUM CHLORIDE 0.9 % IV SOLN
INTRAVENOUS | Status: DC
  Administered 2018-11-30 – 2018-12-02 (×5): via INTRAVENOUS

## 2018-11-30 MED ORDER — ATORVASTATIN CALCIUM 20 MG PO TABS
40.0000 mg | ORAL_TABLET | Freq: Every day | ORAL | Status: DC
  Administered 2018-11-30 – 2018-12-02 (×3): 40 mg via ORAL
  Filled 2018-11-30 (×3): qty 2

## 2018-11-30 MED ORDER — MELATONIN 5 MG PO TABS
20.0000 mg | ORAL_TABLET | Freq: Every day | ORAL | Status: DC
  Administered 2018-11-30 – 2018-12-01 (×2): 20 mg via ORAL
  Filled 2018-11-30 (×3): qty 4

## 2018-11-30 MED ORDER — CALCIUM CARBONATE ANTACID 500 MG PO CHEW
1000.0000 mg | CHEWABLE_TABLET | Freq: Two times a day (BID) | ORAL | Status: DC
  Filled 2018-11-30: qty 2

## 2018-11-30 MED ORDER — MYCOPHENOLATE SODIUM 180 MG PO TBEC
180.0000 mg | DELAYED_RELEASE_TABLET | Freq: Two times a day (BID) | ORAL | Status: DC
  Administered 2018-11-30 – 2018-12-02 (×4): 180 mg via ORAL
  Filled 2018-11-30 (×6): qty 1

## 2018-11-30 MED ORDER — FOLIC ACID 1 MG PO TABS
1.0000 mg | ORAL_TABLET | Freq: Two times a day (BID) | ORAL | Status: DC
  Administered 2018-11-30 – 2018-12-02 (×4): 1 mg via ORAL
  Filled 2018-11-30 (×4): qty 1

## 2018-11-30 MED ORDER — HEPARIN SODIUM (PORCINE) 5000 UNIT/ML IJ SOLN
5000.0000 [IU] | Freq: Three times a day (TID) | INTRAMUSCULAR | Status: DC
  Administered 2018-11-30 – 2018-12-02 (×5): 5000 [IU] via SUBCUTANEOUS
  Filled 2018-11-30 (×5): qty 1

## 2018-11-30 MED ORDER — SODIUM CHLORIDE 0.9 % IV SOLN
1.0000 g | Freq: Once | INTRAVENOUS | Status: AC
  Administered 2018-11-30: 1 g via INTRAVENOUS
  Filled 2018-11-30: qty 10

## 2018-11-30 MED ORDER — SODIUM CHLORIDE 0.9 % IV BOLUS
500.0000 mL | Freq: Once | INTRAVENOUS | Status: AC
  Administered 2018-11-30: 14:00:00 500 mL via INTRAVENOUS

## 2018-11-30 MED ORDER — ENSURE ENLIVE PO LIQD
237.0000 mL | Freq: Two times a day (BID) | ORAL | Status: DC
  Administered 2018-12-01 – 2018-12-02 (×2): 237 mL via ORAL

## 2018-11-30 MED ORDER — CALCITRIOL 0.25 MCG PO CAPS
0.2500 ug | ORAL_CAPSULE | Freq: Every day | ORAL | Status: DC
  Administered 2018-11-30 – 2018-12-02 (×3): 0.25 ug via ORAL
  Filled 2018-11-30 (×3): qty 1

## 2018-11-30 MED ORDER — DONEPEZIL HCL 5 MG PO TABS
5.0000 mg | ORAL_TABLET | Freq: Every day | ORAL | Status: DC
  Administered 2018-11-30 – 2018-12-01 (×2): 5 mg via ORAL
  Filled 2018-11-30 (×2): qty 1

## 2018-11-30 MED ORDER — TRAZODONE HCL 50 MG PO TABS
50.0000 mg | ORAL_TABLET | Freq: Every day | ORAL | Status: DC
  Administered 2018-11-30 – 2018-12-01 (×2): 50 mg via ORAL
  Filled 2018-11-30 (×2): qty 1

## 2018-11-30 MED ORDER — CITALOPRAM HYDROBROMIDE 20 MG PO TABS
20.0000 mg | ORAL_TABLET | Freq: Every day | ORAL | Status: DC
  Administered 2018-11-30 – 2018-12-02 (×3): 20 mg via ORAL
  Filled 2018-11-30 (×3): qty 1

## 2018-11-30 MED ORDER — SODIUM CHLORIDE 0.9 % IV SOLN
1.0000 g | INTRAVENOUS | Status: DC
  Administered 2018-12-01 – 2018-12-02 (×2): 1 g via INTRAVENOUS
  Filled 2018-11-30: qty 10
  Filled 2018-11-30 (×2): qty 1

## 2018-11-30 MED ORDER — SIROLIMUS 1 MG PO TABS
3.0000 mg | ORAL_TABLET | Freq: Every day | ORAL | Status: DC
  Administered 2018-12-01 – 2018-12-02 (×2): 3 mg via ORAL
  Filled 2018-11-30 (×2): qty 3

## 2018-11-30 MED ORDER — VITAMIN D 25 MCG (1000 UNIT) PO TABS
1000.0000 [IU] | ORAL_TABLET | Freq: Every day | ORAL | Status: DC
  Administered 2018-12-01 – 2018-12-02 (×2): 1000 [IU] via ORAL
  Filled 2018-11-30 (×2): qty 1

## 2018-11-30 MED ORDER — CALCIUM CARBONATE ANTACID 500 MG PO CHEW
400.0000 mg | CHEWABLE_TABLET | Freq: Two times a day (BID) | ORAL | Status: DC
  Administered 2018-11-30 – 2018-12-02 (×4): 400 mg via ORAL
  Filled 2018-11-30 (×4): qty 2

## 2018-11-30 NOTE — ED Notes (Signed)
Pt family member brought her medication to ed. This RN gave her the bottle so it is patient's possession now. She will be taking her daily dose on her own of 3mg  of Sirolimus for today.

## 2018-11-30 NOTE — Progress Notes (Addendum)
Pharmacy send a massage to pick medication, Sirolimus/Rapamune but latter called and inform the writer that the medication is not at the pharmacy.

## 2018-11-30 NOTE — Progress Notes (Signed)
Off floor at this time for eeg. ivfs started earlier/ pts kidney med to pharmacy.

## 2018-11-30 NOTE — H&P (Signed)
[facility] [place] NAME: [name]    MR#:  [id number]  DATE OF BIRTH:  1950/03/30  DATE OF ADMISSION:  11/30/2018  PRIMARY CARE PHYSICIAN: Baxter Hire, MD   REQUESTING/REFERRING PHYSICIAN: Marjean Donna  CHIEF COMPLAINT:   Chief Complaint  Patient presents with   Altered Mental Status    HISTORY OF PRESENT ILLNESS:  [name]  is a 68 y.o. female with a known history of dementia, gastroesophageal reflux disease, prior history of kidney transplant in 2007 at [facility] ,[place] but patient currently following up with nephrology service at [facility] transplant service with Dr. Lyndel Safe prior history of uterine cancer who was brought into the emergency room today with complaints of confusion and some twitching movement.  Patient apparently reported to have some intermittent twitching for which she is scheduled for outpatient EEG but not yet done.  Was started on Neurontin which appeared to have made it worse.  No evidence of twitching since arrival in the emergency room today.  Patient evaluated in the emergency room and found to have urinary tract infection.  Started on IV Rocephin.  Noted slight decline in renal function.  Medical service called to admit patient for further evaluation and management. History obtained from emergency room provider and patient's daughter over the phone.  PAST MEDICAL HISTORY:   Past Medical History:  Diagnosis Date   Anemia    C. difficile colitis    Cancer of kidney ([place])    Dementia ([place])    GERD (gastroesophageal reflux disease)    Hypertension    Renal disorder    Uterine cancer ([place])     PAST SURGICAL HISTORY:   Past Surgical History:  Procedure Laterality Date   ABDOMINAL HYSTERECTOMY     AV FISTULA PLACEMENT     BREAST BIOPSY     Patient is unsure but she thinks she remember a Bx   BREAST BIOPSY Right 2017   benign   KIDNEY TRANSPLANT     NEPHRECTOMY  TRANSPLANTED ORGAN      SOCIAL HISTORY:   Social History   Tobacco Use   Smoking status: Former Smoker   Smokeless tobacco: Never Used  Substance Use Topics   Alcohol use: No    FAMILY HISTORY:   Family History  Problem Relation Age of Onset   Stroke Mother    Alcohol abuse Father    Breast cancer Neg Hx     DRUG ALLERGIES:   Allergies  Allergen Reactions   Ace Inhibitors Cough   Latex Swelling   Lisinopril Cough    REVIEW OF SYSTEMS:   ROS patient pleasantly confused.  Unobtainable due to underlying condition.  MEDICATIONS AT HOME:   Prior to Admission medications   Medication Sig Start Date End Date Taking? Authorizing Provider  atorvastatin (LIPITOR) 40 MG tablet Take 40 mg by mouth daily.    Yes [provider]  calcitRIOL (ROCALTROL) 0.25 MCG capsule Take 0.25 mcg by mouth daily.   Yes [provider]  calcium elemental as carbonate (TUMS ULTRA 1000) 400 MG chewable tablet Chew 1,000 mg by mouth 2 (two) times daily.    Yes [provider]  citalopram (CELEXA) 20 MG tablet Take 20 mg by mouth daily.   Yes [provider]  donepezil (ARICEPT) 5 MG tablet Take 5 mg by mouth at bedtime.    Yes [provider]  folic acid (FOLVITE) 1 MG tablet Take 1 mg by mouth  2 (two) times daily.    Yes [provider]  furosemide (LASIX) 20 MG tablet Take 1 tablet (20 mg total) by mouth daily. 08/17/18  Yes Gladstone Lighter, MD  gabapentin (NEURONTIN) 100 MG capsule Take 100-200 mg by mouth 2 (two) times daily. 11/28/18  Yes [provider]  meloxicam (MOBIC) 15 MG tablet Take 15 mg by mouth daily. 06/14/18  Yes [provider]  metoprolol tartrate (LOPRESSOR) 25 MG tablet Take 25 mg by mouth 2 (two) times daily.   Yes [provider]  mycophenolate (MYFORTIC) 180 MG EC tablet Take 180 mg by mouth 2 (two) times daily.   Yes [provider]  potassium chloride (K-DUR) 10 MEQ tablet  Take 2 tablets (20 mEq total) by mouth daily. ONLY WHILE TAKING LASIX Patient taking differently: Take 20 mEq by mouth 2 (two) times daily.  08/17/18  Yes Gladstone Lighter, MD  sirolimus (RAPAMUNE) 1 MG tablet Take 3 mg by mouth daily.    Yes [provider]  traZODone (DESYREL) 50 MG tablet Take 50 mg by mouth at bedtime.  10/11/18 10/11/19 Yes [provider]  cholecalciferol (VITAMIN D3) 25 MCG (1000 UT) tablet Take 1,000 Units by mouth daily.    [provider]  feeding supplement, ENSURE ENLIVE, (ENSURE ENLIVE) LIQD Take 237 mLs by mouth 2 (two) times daily between meals. 09/27/18   Saundra Shelling, MD  Melatonin 10 MG TABS Take 20 mg by mouth at bedtime.    [provider]      VITAL SIGNS:  Blood pressure 102/68, pulse 61, temperature 97.6 F (36.4 C), temperature source Oral, resp. rate 16, height 4\' 11"  (1.499 m), weight 95.7 kg, SpO2 94 %.  PHYSICAL EXAMINATION:  Physical Exam  GENERAL:  68 y.o.-year-old patient lying in the bed with no acute distress.  EYES: Pupils equal, round, reactive to light and accommodation. No scleral icterus. Extraocular muscles intact.  HEENT: Head atraumatic, normocephalic. Oropharynx and nasopharynx clear.  NECK:  Supple, no jugular venous distention. No thyroid enlargement, no tenderness.  LUNGS: Normal breath sounds bilaterally, no wheezing, rales,rhonchi or crepitation. No use of accessory muscles of respiration.  CARDIOVASCULAR: S1, S2 normal. No murmurs, rubs, or gallops.  ABDOMEN: Soft, nontender, nondistended. Bowel sounds present. No organomegaly or mass.  EXTREMITIES: No pedal edema, cyanosis, or clubbing.  NEUROLOGIC: Generalized weakness with no focal deficit.  Sensation intact. Gait not checked.  PSYCHIATRIC: The patient is alert and oriented x 3.  SKIN: No obvious rash, lesion,   LABORATORY PANEL:   CBC Recent Labs  Lab 11/30/18 1001  WBC 3.9*  HGB 8.6*  HCT 28.0*  PLT 171    ------------------------------------------------------------------------------------------------------------------  Chemistries  Recent Labs  Lab 11/30/18 1001  NA 141  K 4.1  CL 105  CO2 26  GLUCOSE 98  BUN 43*  CREATININE 2.45*  CALCIUM 8.7*  AST 14*  ALT 12  ALKPHOS 45  BILITOT 0.5   ------------------------------------------------------------------------------------------------------------------  Cardiac Enzymes No results for input(s): TROPONINI in the last 168 hours. ------------------------------------------------------------------------------------------------------------------  RADIOLOGY:  Ct Head Wo Contrast  Result Date: 11/30/2018 CLINICAL DATA:  Altered mental status EXAM: CT HEAD WITHOUT CONTRAST TECHNIQUE: Contiguous axial images were obtained from the base of the skull through the vertex without intravenous contrast. COMPARISON:  08/01/2018 FINDINGS: Brain: No evidence of acute infarction, hemorrhage, hydrocephalus, extra-axial collection or mass lesion/mass effect. Atrophy especially prominent in the temporal lobes in this patient with history of dementia. Chronic small vessel ischemia in the cerebral white  matter. Vascular: Atherosclerotic calcification Skull: Negative for fracture or erosion Sinuses/Orbits: Negative IMPRESSION: 1. No acute finding. 2. Atrophy in keeping with history of dementia. Electronically Signed   By: Monte Fantasia M.D.   On: 11/30/2018 10:55   Ct Renal Stone Study  Result Date: 11/30/2018 CLINICAL DATA:  UTI. EXAM: CT ABDOMEN AND PELVIS WITHOUT CONTRAST TECHNIQUE: Multidetector CT imaging of the abdomen and pelvis was performed following the standard protocol without IV contrast. COMPARISON:  CT abdomen pelvis dated September 26, 2018. FINDINGS: Lower chest: Unchanged trace bilateral pleural effusions and small pericardial effusion. Hepatobiliary: No focal liver abnormality is seen. Status post cholecystectomy. Unchanged mild dilatation  of the common bile duct, likely due to post cholecystectomy state. Pancreas: Unremarkable. No pancreatic ductal dilatation or surrounding inflammatory changes. Spleen: Normal in size without focal abnormality. Adrenals/Urinary Tract: The adrenal glands are unremarkable. Prior left nephrectomy. Severely atrophic right kidney. Unchanged right lower quadrant transplant kidney without calculi or hydronephrosis. Similar appearing mild asymmetric bladder wall thickening and pericystic fat stranding. Stomach/Bowel: Stomach is within normal limits. Appendix is not definitely visualized, but there are no signs of inflammation at the base of the cecum. No evidence of bowel wall thickening, distention, or inflammatory changes. Vascular/Lymphatic: Aortic atherosclerosis. No enlarged abdominal or pelvic lymph nodes. Reproductive: Status post hysterectomy. No adnexal masses. Other: Prior ventral hernia repair. No recurrent hernia. No pneumoperitoneum or free fluid. Musculoskeletal: No acute or significant osseous findings. Chronic right femoral head avascular necrosis. IMPRESSION: 1. Similar appearing mild asymmetric bladder wall thickening and pericystic fat stranding. Correlate with urinalysis to exclude recurrent cystitis. 2. Unremarkable right lower quadrant transplant kidney. No urolithiasis or hydronephrosis. 3. Unchanged trace bilateral pleural effusions and small pericardial effusion. 4.  Aortic atherosclerosis (ICD10-I70.0). Electronically Signed   By: Titus Dubin M.D.   On: 11/30/2018 10:57      IMPRESSION AND PLAN:  Patient is a 68 year old female with history of dementia, gastroesophageal reflux disease, prior history of kidney transplant in 2007 at [facility] ,[place] but patient currently following up with nephrology service at [facility] transplant service with Dr. Lyndel Safe prior history of uterine cancer being admitted for UTI and mild acute kidney injury  1.  Urinary tract infection Patient started on IV  Rocephin. IV fluid hydration.  Follow-up on cultures.  2.  Acute metabolic encephalopathy Secondary to UTI in the setting of underlying dementia. Treatment of UTI as outlined above.  Resume home dose of Aricept.  3.  Acute kidney injury Noted slight decline in renal function with creatinine of 2.45. Patient status post kidney transplant in 2007 at [facility], [place]. Patient currently following up with nephrology service at [facility] transplant service with Dr. Lyndel Safe I discussed with patient's daughter and also a page has been sent out to the nephrologist but no response yet Levels of CellCept and sirolimus requested in the emergency room Placed on IV fluid hydration. Requested for nephrology consultation.  4.  Nonspecific twitching None witnessed since arrival in the emergency room. This was said to have been worsened by gabapentin which was started previously.  Gabapentin placed on hold. Patient was supposed to have an outpatient EEG.  Have requested for EEG. If patient has any evidence of twitching during this admission or EEG is abnormal, would recommend getting neurology consultation  DVT prophylaxis; heparin  All the records are reviewed and case discussed with ED provider. I called and updated patient's daughter Ms [name] who confirmed patient's CODE STATUS to be full code.  CODE STATUS: Full  code  TOTAL TIME TAKING CARE OF THIS PATIENT: 60 minutes.    Betrice Wanat M.D on 11/30/2018 at 4:12 PM  Between 7am to 6pm - Pager - [phone]  After 6pm go to [email or web address] - Proofreader  [facility]  Office  [phone]  CC: Primary care physician; Baxter Hire, MD   Note: This dictation was prepared with Dragon dictation along with smaller phrase technology. Any transcriptional errors that result from this process are unintentional.

## 2018-11-30 NOTE — ED Provider Notes (Signed)
[facility] Emergency Department Provider Note  ____________________________________________   First MD Initiated Contact with Patient 11/30/18 1032     (approximate)  I have reviewed the triage vital signs and the nursing notes.   HISTORY  Chief Complaint Altered Mental Status    HPI Sylver Vantassell is a 68 y.o. female with C. difficile colitis, kidney cancer, dimension, hypertension, uterine cancer, status post kidney transplant with dementia who presents with concern for recurrent UTI.  During recent admission patient's urine culture was negative and they held additional antibiotics.  This time patient presents from home with concern for foul-smelling dark urine and mental status being off.  Patient herself denies any symptoms but is not the best historian.  History limited due to patient's baseline dementia          Past Medical History:  Diagnosis Date   Anemia    C. difficile colitis    Cancer of kidney ([place])    Dementia ([place])    GERD (gastroesophageal reflux disease)    Hypertension    Renal disorder    Uterine cancer [facility])     Patient Active Problem List   Diagnosis Date Noted   Acute lower UTI 11/07/2018   C. 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 Authorizing Provider  atorvastatin (LIPITOR) 40 MG tablet Take 40 mg by mouth daily.     [provider]  calcitRIOL (ROCALTROL) 0.25 MCG capsule Take 0.25 mcg by mouth daily.    [provider]  calcium elemental as carbonate (TUMS ULTRA 1000) 400 MG chewable tablet Chew 1,000 mg by mouth daily as needed for heartburn.    [provider]  cholecalciferol (VITAMIN D3) 25 MCG (1000 UT) tablet Take 1,000 Units by mouth daily.    [provider]  citalopram (CELEXA) 20 MG tablet Take 20 mg by mouth daily.    [provider]  donepezil (ARICEPT) 5 MG tablet Take 5 mg by mouth at bedtime.     [provider]  feeding supplement, ENSURE ENLIVE, (ENSURE ENLIVE) LIQD Take 237 mLs by mouth 2 (two) times daily between meals. 09/27/18   Saundra Shelling, MD  folic acid (FOLVITE) 1 MG tablet Take 1 mg by mouth 2 (two) times daily.     [provider]  furosemide (LASIX) 20 MG tablet Take 1 tablet (20 mg total) by mouth daily. Patient taking differently: Take 20 mg by mouth 2 (two) times daily.  08/17/18   Gladstone Lighter, MD  Melatonin 10 MG TABS Take 20 mg by mouth at bedtime.    [provider]  meloxicam (MOBIC) 15 MG tablet Take  15 mg by mouth daily. 06/14/18   [provider]  metoprolol tartrate (LOPRESSOR) 25 MG tablet Take 25 mg by mouth 2 (two) times daily.    [provider]  mycophenolate (MYFORTIC) 180 MG EC tablet Take 180 mg by mouth 2 (two) times daily.    [provider]  potassium chloride (K-DUR) 10 MEQ tablet Take 2 tablets (20 mEq total) by mouth daily. ONLY WHILE TAKING LASIX Patient taking differently: Take 20 mEq by mouth 2 (two) times daily.  08/17/18   Gladstone Lighter, MD  sirolimus (RAPAMUNE) 1 MG tablet Take 3 mg by mouth daily.     [provider]    traZODone (DESYREL) 50 MG tablet Take 50 mg by mouth at bedtime.  10/11/18 10/11/19  [provider]    Allergies Ace inhibitors and Lisinopril  Family History  Problem Relation Age of Onset   Stroke Mother    Alcohol abuse Father    Breast cancer Neg Hx     Social History Social History   Tobacco Use   Smoking status: Former Smoker   Smokeless tobacco: Never Used  Substance Use Topics   Alcohol use: No   Drug use: No      Review of Systems Constitutional: No fever/chills Eyes: No visual changes. ENT: No sore throat. Cardiovascular: Denies chest pain. Respiratory: Denies shortness of breath. Gastrointestinal: No abdominal pain.  No nausea, no vomiting.  No diarrhea.  No constipation. Genitourinary: Positive for change in the smell of her urine. Musculoskeletal: Negative for back pain. Skin: Negative for rash. Neurological: Negative for headaches, focal weakness or numbness. Patient denies any symptoms but her history is limited due to baseline dementia. ____________________________________________   PHYSICAL EXAM:  VITAL SIGNS: ED Triage Vitals  Enc Vitals Group     BP 11/30/18 0957 (!) 144/75     Pulse Rate 11/30/18 0957 64     Resp --      Temp 11/30/18 0957 97.6 F (36.4 C)     Temp Source 11/30/18 0957 Oral     SpO2 11/30/18 0957 100 %     Weight 11/30/18 0951 211 lb (95.7 kg)     Height 11/30/18 0951 4\' 11"  (1.499 m)     Head Circumference --      Peak Flow --      Pain Score 11/30/18 0951 0     Pain Loc --      Pain Edu? --      Excl. in [place]? --     Constitutional: Alert and oriented. Well appearing and in no acute distress. Eyes: Conjunctivae are normal. EOMI. Head: Atraumatic. Nose: No congestion/rhinnorhea. Mouth/Throat: Mucous membranes are moist.   Neck: No stridor. Trachea Midline. FROM Cardiovascular: Normal rate, regular rhythm. Grossly normal heart sounds.  Good peripheral circulation. Respiratory: Normal respiratory  effort.  No retractions. Lungs CTAB. Gastrointestinal: Soft and nontender. No distention. No abdominal bruits.  Musculoskeletal: No lower extremity tenderness nor edema.  No joint effusions.  Left arm fistula with good thrill Neurologic:  Normal speech and language. No gross focal neurologic deficits are appreciated.  Skin:  Skin is warm, dry and intact. No rash noted. Psychiatric: Mood and affect are normal. Speech and behavior are normal. GU: Deferred   ____________________________________________   LABS (all labs ordered are listed, but only abnormal results are displayed)  Labs Reviewed  CBC WITH DIFFERENTIAL/PLATELET - Abnormal; Notable for the following components:      Result Value   WBC 3.9 (*)  RBC 3.46 (*)    Hemoglobin 8.6 (*)    HCT 28.0 (*)    MCH 24.9 (*)    RDW 16.3 (*)    All other components within normal limits  COMPREHENSIVE METABOLIC PANEL - Abnormal; Notable for the following components:   BUN 43 (*)    Creatinine, Ser 2.45 (*)    Calcium 8.7 (*)    AST 14 (*)    GFR calc non Af Amer 20 (*)    GFR calc Af Amer 23 (*)    All other components within normal limits  URINALYSIS, ROUTINE W REFLEX MICROSCOPIC - Abnormal; Notable for the following components:   Color, Urine YELLOW (*)    APPearance TURBID (*)    Hgb urine dipstick LARGE (*)    Protein, ur 100 (*)    Leukocytes,Ua LARGE (*)    RBC / HPF >50 (*)    WBC, UA >50 (*)    All other components within normal limits  URINE CULTURE  CULTURE, BLOOD (ROUTINE X 2)  CULTURE, BLOOD (ROUTINE X 2)  SARS CORONAVIRUS 2 (TAT 6-24 HRS)  LACTIC ACID, PLASMA  SIROLIMUS LEVEL  MYCOPHENOLIC ACID (CELLCEPT)   ____________________________________________   ED ECG REPORT I, Vanessa , the attending physician, personally viewed and interpreted this ECG.  EKG is normal sinus rate 65, no ST elevation, no T wave inversion, normal intervals ____________________________________________  RADIOLOGY   Official  radiology report(s): Ct Head Wo Contrast  Result Date: 11/30/2018 CLINICAL DATA:  Altered mental status EXAM: CT HEAD WITHOUT CONTRAST TECHNIQUE: Contiguous axial images were obtained from the base of the skull through the vertex without intravenous contrast. COMPARISON:  08/01/2018 FINDINGS: Brain: No evidence of acute infarction, hemorrhage, hydrocephalus, extra-axial collection or mass lesion/mass effect. Atrophy especially prominent in the temporal lobes in this patient with history of dementia. Chronic small vessel ischemia in the cerebral white matter. Vascular: Atherosclerotic calcification Skull: Negative for fracture or erosion Sinuses/Orbits: Negative IMPRESSION: 1. No acute finding. 2. Atrophy in keeping with history of dementia. Electronically Signed   By: Monte Fantasia M.D.   On: 11/30/2018 10:55   Ct Renal Stone Study  Result Date: 11/30/2018 CLINICAL DATA:  UTI. EXAM: CT ABDOMEN AND PELVIS WITHOUT CONTRAST TECHNIQUE: Multidetector CT imaging of the abdomen and pelvis was performed following the standard protocol without IV contrast. COMPARISON:  CT abdomen pelvis dated September 26, 2018. FINDINGS: Lower chest: Unchanged trace bilateral pleural effusions and small pericardial effusion. Hepatobiliary: No focal liver abnormality is seen. Status post cholecystectomy. Unchanged mild dilatation of the common bile duct, likely due to post cholecystectomy state. Pancreas: Unremarkable. No pancreatic ductal dilatation or surrounding inflammatory changes. Spleen: Normal in size without focal abnormality. Adrenals/Urinary Tract: The adrenal glands are unremarkable. Prior left nephrectomy. Severely atrophic right kidney. Unchanged right lower quadrant transplant kidney without calculi or hydronephrosis. Similar appearing mild asymmetric bladder wall thickening and pericystic fat stranding. Stomach/Bowel: Stomach is within normal limits. Appendix is not definitely visualized, but there are no signs of  inflammation at the base of the cecum. No evidence of bowel wall thickening, distention, or inflammatory changes. Vascular/Lymphatic: Aortic atherosclerosis. No enlarged abdominal or pelvic lymph nodes. Reproductive: Status post hysterectomy. No adnexal masses. Other: Prior ventral hernia repair. No recurrent hernia. No pneumoperitoneum or free fluid. Musculoskeletal: No acute or significant osseous findings. Chronic right femoral head avascular necrosis. IMPRESSION: 1. Similar appearing mild asymmetric bladder wall thickening and pericystic fat stranding. Correlate with urinalysis to exclude recurrent cystitis. 2. Unremarkable  right lower quadrant transplant kidney. No urolithiasis or hydronephrosis. 3. Unchanged trace bilateral pleural effusions and small pericardial effusion. 4.  Aortic atherosclerosis (ICD10-I70.0). Electronically Signed   By: Titus Dubin M.D.   On: 11/30/2018 10:57    ____________________________________________   PROCEDURES  Procedure(s) performed (including Critical Care):  Procedures   ____________________________________________   INITIAL IMPRESSION / ASSESSMENT AND PLAN / ED COURSE  Karletta Millay was evaluated in Emergency Department on 11/30/2018 for the symptoms described in the history of present illness. She was evaluated in the context of the global COVID-19 pandemic, which necessitated consideration that the patient might be at risk for infection with the SARS-CoV-2 virus that causes COVID-19. Institutional protocols and algorithms that pertain to the evaluation of patients at risk for COVID-19 are in a state of rapid change based on information released by regulatory bodies including the CDC and federal and state organizations. These policies and algorithms were followed during the patient's care in the ED.    Patient is a 68 year old who is coming in for altered mental status and foul-smelling urine.  Recent urine culture has been negative.  Will get  repeat urine.  Given altered mental status will get CT head to evaluate for epidural, intracranial hemorrhage.  Also get CT renal to make sure there is no kidney stone.  Will get labs evaluate for electrolyte abnormalities, AKI.  He had negative.  CT abdomen negative.  Urine looks similar to prior urines with elevated WBCs and RBCs.  Given report of foul-smelling urine and the fact that her kidney function is slightly elevated from baseline at 2.4 up from 1.7 we will give 500 cc of fluid and 1 dose of ceftriaxone while urine culture is pending.  I discussed with patient's daughter who then reports that she has been having some twitching movements recently.  It looks like patient was seen by neurology a few days ago and started on gabapentin.  I have not witnessed any of this twitching movement myself what sounds are their plan to do outpatient EEG.  I will get out on levels of her immunosuppressant meds given these can sometimes cause neurological effects.  Otherwise I have discussed with the hospital team about this and maybe they can keep consider doing the EEG here if she develops any more of the twitching.  Discussed possible team and they will admit patient  ____________________________________________   FINAL CLINICAL IMPRESSION(S) / ED DIAGNOSES   Final diagnoses:  Jerking  Acute cystitis with hematuria      MEDICATIONS GIVEN DURING THIS VISIT:  Medications  sodium chloride 0.9 % bolus 500 mL (500 mLs Intravenous New Bag/Given 11/30/18 1343)  cefTRIAXone (ROCEPHIN) 1 g in sodium chloride 0.9 % 100 mL IVPB (0 g Intravenous Stopped 11/30/18 1441)     ED Discharge Orders    None       Note:  This document was prepared using Dragon voice recognition software and may include unintentional dictation errors.   Vanessa El[place]wood, MD 11/30/18 [phone]

## 2018-11-30 NOTE — ED Triage Notes (Signed)
From home by [facility], lives with daughter Hx of dementia Urine has a foul smell, dark and cloudy Mental status possibly more "off" per daughter 136/50 HR 68 97% RA afebrile denies pain during urination kidney transplant pt, old shunt in left arm daughter will bring meds for transplant

## 2018-11-30 NOTE — Progress Notes (Signed)
Pt to 102 from ed. Alert. No distress.vss.

## 2018-11-30 NOTE — Progress Notes (Addendum)
eeg completed ° °

## 2018-11-30 NOTE — ED Notes (Signed)
Pt's daughter updated on patient status.

## 2018-11-30 NOTE — ED Notes (Signed)
Called floor to give report. Will call back in 84minutes

## 2018-11-30 NOTE — Progress Notes (Signed)
Care Alignment Note  Advanced Directives Documents (Living Will, Power of Attorney) currently in the EHR no advanced directives documents available .  Has the patient discussed their wishes with their family/healthcare power of attorney yes. How much does the family or healthcare power of attorney know about their wishes. Patient's daughter who makes medical decisions confirmed patient's CODE STATUS to be full code.  She understands patient has multiple medical problems including dementia, prior kidney transplant,  What does the patient/decision maker understand about their medical condition and the natural course of their disease.  Urinary tract infection.  Acute metabolic encephalopathy secondary to UTI.  Prior history of kidney transplant.  What is the patient/decision maker's biggest fear or concern for the future pain and suffering   What is the most important goal for this patient should their health condition worsen maintenance of function.  Current   Code Status: Full Code  Current code status has been reviewed/updated.  Time spent:17 minutes

## 2018-12-01 DIAGNOSIS — R4182 Altered mental status, unspecified: Secondary | ICD-10-CM

## 2018-12-01 LAB — BASIC METABOLIC PANEL
Anion gap: 6 (ref 5–15)
BUN: 33 mg/dL — ABNORMAL HIGH (ref 8–23)
CO2: 22 mmol/L (ref 22–32)
Calcium: 8.1 mg/dL — ABNORMAL LOW (ref 8.9–10.3)
Chloride: 113 mmol/L — ABNORMAL HIGH (ref 98–111)
Creatinine, Ser: 2.06 mg/dL — ABNORMAL HIGH (ref 0.44–1.00)
GFR calc Af Amer: 28 mL/min — ABNORMAL LOW (ref >60)
GFR calc non Af Amer: 24 mL/min — ABNORMAL LOW (ref >60)
Glucose, Bld: 85 mg/dL (ref 70–99)
Potassium: 3.7 mmol/L (ref 3.5–5.1)
Sodium: 141 mmol/L (ref 135–145)

## 2018-12-01 LAB — RETICULOCYTES
Immature Retic Fract: 15.1 % (ref 2.3–15.9)
RBC.: 3.18 MIL/uL — ABNORMAL LOW (ref 3.87–5.11)
Retic Count, Absolute: 51.2 10*3/uL (ref 19.0–186.0)
Retic Ct Pct: 1.6 % (ref 0.4–3.1)

## 2018-12-01 LAB — VITAMIN B12: Vitamin B-12: 184 pg/mL (ref 180–914)

## 2018-12-01 LAB — FERRITIN: Ferritin: 223 ng/mL (ref 11–307)

## 2018-12-01 LAB — URINE CULTURE: Culture: NO GROWTH

## 2018-12-01 LAB — SARS CORONAVIRUS 2 (TAT 6-24 HRS): SARS Coronavirus 2: NEGATIVE

## 2018-12-01 LAB — CBC
HCT: 26.5 % — ABNORMAL LOW (ref 36.0–46.0)
Hemoglobin: 7.8 g/dL — ABNORMAL LOW (ref 12.0–15.0)
MCH: 24.3 pg — ABNORMAL LOW (ref 26.0–34.0)
MCHC: 29.4 g/dL — ABNORMAL LOW (ref 30.0–36.0)
MCV: 82.6 fL (ref 80.0–100.0)
Platelets: 148 10*3/uL — ABNORMAL LOW (ref 150–400)
RBC: 3.21 MIL/uL — ABNORMAL LOW (ref 3.87–5.11)
RDW: 16.1 % — ABNORMAL HIGH (ref 11.5–15.5)
WBC: 3.2 10*3/uL — ABNORMAL LOW (ref 4.0–10.5)
nRBC: 0 % (ref 0.0–0.2)

## 2018-12-01 LAB — IRON AND TIBC
Iron: 22 ug/dL — ABNORMAL LOW (ref 28–170)
Saturation Ratios: 13 % (ref 10.4–31.8)
TIBC: 168 ug/dL — ABNORMAL LOW (ref 250–450)
UIBC: 146 ug/dL

## 2018-12-01 LAB — MAGNESIUM: Magnesium: 2.2 mg/dL (ref 1.7–2.4)

## 2018-12-01 LAB — PHOSPHORUS: Phosphorus: 4 mg/dL (ref 2.5–4.6)

## 2018-12-01 LAB — FOLATE: Folate: 66 ng/mL (ref >5.9)

## 2018-12-01 NOTE — Progress Notes (Signed)
[place] [facility]                                                                                                                                                                                  Patient Demographics   [name], is a 68 y.o. female, DOB - November 24, 1950, DJM:[id number]  Admit date - 11/30/2018   Admitting Physician Jude Stark Jock, MD  Outpatient Primary MD for the patient is Baxter Hire, MD   LOS - 1  Subjective:  Patient states that she is feeling better.   Review of Systems:   CONSTITUTIONAL: No documented fever. No fatigue, weakness. No weight gain, no weight loss.  EYES: No blurry or double vision.  ENT: No tinnitus. No postnasal drip. No redness of the oropharynx.  RESPIRATORY: No cough, no wheeze, no hemoptysis. No dyspnea.  CARDIOVASCULAR: No chest pain. No orthopnea. No palpitations. No syncope.  GASTROINTESTINAL: No nausea, no vomiting or diarrhea. No abdominal pain. No melena or hematochezia.  GENITOURINARY: No dysuria or hematuria.  ENDOCRINE: No polyuria or nocturia. No heat or cold intolerance.  HEMATOLOGY: No anemia. No bruising. No bleeding.  INTEGUMENTARY: No rashes. No lesions.  MUSCULOSKELETAL: No arthritis. No swelling. No gout.  NEUROLOGIC: No numbness, tingling, or ataxia. No seizure-type activity.  PSYCHIATRIC: No anxiety. No insomnia. No ADD.    Vitals:   Vitals:   11/30/18 1751 11/30/18 2102 12/01/18 0438 12/01/18 0918  BP: (!) 157/74 (!) 130/58 (!) 154/64 (!) 142/69  Pulse: 60 (!) 52 (!) 54 (!) 59  Resp: 18 16 16 18   Temp: 97.9 F (36.6 C) (!) 97.5 F (36.4 C) 97.7 F (36.5 C) 97.7 F (36.5 C)  TempSrc:  Oral Oral Oral  SpO2: (!) 89% 98% 99% 100%  Weight:      Height:        Wt Readings from Last 3 Encounters:  11/30/18 95.7 kg  11/07/18 95.7 kg  10/15/18 88.9 kg     Intake/Output Summary (Last 24 hours) at 12/01/2018 1147 Last data filed at 12/01/2018 0900 Gross per 24 hour   Intake 1319.91 ml  Output 350 ml  Net 969.91 ml    Physical Exam:   GENERAL: Pleasant-appearing in no apparent distress.  HEAD, EYES, EARS, NOSE AND THROAT: Atraumatic, normocephalic. Extraocular muscles are intact. Pupils equal and reactive to light. Sclerae anicteric. No conjunctival injection. No oro-pharyngeal erythema.  NECK: Supple. There is no jugular venous distention. No bruits, no lymphadenopathy, no thyromegaly.  HEART: Regular rate and rhythm,. No murmurs, no rubs, no clicks.  LUNGS: Clear to auscultation bilaterally. No rales or rhonchi. No wheezes.  ABDOMEN: Soft, flat, nontender, nondistended. Has good  bowel sounds. No hepatosplenomegaly appreciated.  EXTREMITIES: No evidence of any cyanosis, clubbing, or peripheral edema.  +2 pedal and radial pulses bilaterally.  NEUROLOGIC: The patient is alert, awake, and oriented x3 with no focal motor or sensory deficits appreciated bilaterally.  SKIN: Moist and warm with no rashes appreciated.  Psych: Not anxious, depressed LN: No inguinal LN enlargement    Antibiotics   Anti-infectives (From admission, onward)   Start     Dose/Rate Route Frequency Ordered Stop   12/01/18 1000  cefTRIAXone (ROCEPHIN) 1 g in sodium chloride 0.9 % 100 mL IVPB     1 g 200 mL/hr over 30 Minutes Intravenous Every 24 hours 11/30/18 1611     11/30/18 1345  cefTRIAXone (ROCEPHIN) 1 g in sodium chloride 0.9 % 100 mL IVPB     1 g 200 mL/hr over 30 Minutes Intravenous  Once 11/30/18 1341 11/30/18 1441      Medications   Scheduled Meds: . atorvastatin  40 mg Oral Daily  . calcitRIOL  0.25 mcg Oral Daily  . calcium carbonate  400 mg of elemental calcium Oral BID  . cholecalciferol  1,000 Units Oral Daily  . citalopram  20 mg Oral Daily  . donepezil  5 mg Oral QHS  . feeding supplement (ENSURE ENLIVE)  237 mL Oral BID BM  . folic acid  1 mg Oral BID  . heparin  5,000 Units Subcutaneous Q8H  . Melatonin  20 mg Oral QHS  . metoprolol tartrate  25  mg Oral BID  . mycophenolate  180 mg Oral BID  . sirolimus  3 mg Oral Daily  . traZODone  50 mg Oral QHS   Continuous Infusions: . sodium chloride 100 mL/hr at 12/01/18 0401  . cefTRIAXone (ROCEPHIN)  IV 1 g (12/01/18 0939)   PRN Meds:.   Data Review:   Micro Results Recent Results (from the past 240 hour(s))  Urine culture     Status: None   Collection Time: 11/30/18 10:21 AM   Specimen: Urine, Random  Result Value Ref Range Status   Specimen Description   Final    URINE, RANDOM Performed at [facility], [address], [place], [place] [address]    Special Requests   Final    NONE Performed at [facility], [address] [name], [place] [address]    Culture   Final    NO GROWTH Performed at [facility], [place] [address] [place], [place] [address]    Report Status 12/01/2018 FINAL  Final  Blood culture (routine x 2)     Status: None (Preliminary result)   Collection Time: 11/30/18 11:48 AM   Specimen: BLOOD  Result Value Ref Range Status   Specimen Description BLOOD RIGHT ANTECUBITAL  Final   Special Requests   Final    BOTTLES DRAWN AEROBIC AND ANAEROBIC Blood Culture adequate volume   Culture   Final    NO GROWTH < 24 HOURS Performed at [facility], [address] [place], [place] [address]    Report Status PENDING  Incomplete  Blood culture (routine x 2)     Status: None (Preliminary result)   Collection Time: 11/30/18 11:48 AM   Specimen: BLOOD  Result Value Ref Range Status   Specimen Description BLOOD BLOOD RIGHT HAND  Final   Special Requests   Final    BOTTLES DRAWN AEROBIC AND ANAEROBIC Blood Culture adequate volume   Culture   Final    NO GROWTH <  72 HOURS Performed at [facility], [place], [place] [address]    Report Status PENDING  Incomplete  SARS CORONAVIRUS 2 (TAT 6-24 HRS) Nasopharyngeal Nasopharyngeal Swab     Status: None   Collection Time: 11/30/18  2:42 PM   Specimen:  Nasopharyngeal Swab  Result Value Ref Range Status   SARS Coronavirus 2 NEGATIVE NEGATIVE Final    Comment: (NOTE) SARS-CoV-2 target nucleic acids are NOT DETECTED. The SARS-CoV-2 RNA is generally detectable in upper and lower respiratory specimens during the acute phase of infection. Negative results do not preclude SARS-CoV-2 infection, do not rule out co-infections with other pathogens, and should not be used as the sole basis for treatment or other patient management decisions. Negative results must be combined with clinical observations, patient history, and epidemiological information. The expected result is Negative. Fact Sheet for Patients: SugarRoll.be Fact Sheet for Healthcare Providers: https://www.woods-mathews.com/ This test is not yet approved or cleared by the [place] FDA and  has been authorized for detection and/or diagnosis of SARS-CoV-2 by FDA under an Emergency Use Authorization (EUA). This EUA will remain  in effect (meaning this test can be used) for the duration of the COVID-19 declaration under Section 56 4(b)(1) of the Act, 21 U.S.C. section 360bbb-3(b)(1), unless the authorization is terminated or revoked sooner. Performed at [facility], [place] [address] [place], [place] [address]     Radiology Reports Ct Head Wo Contrast  Result Date: 11/30/2018 CLINICAL DATA:  Altered mental status EXAM: CT HEAD WITHOUT CONTRAST TECHNIQUE: Contiguous axial images were obtained from the base of the skull through the vertex without intravenous contrast. COMPARISON:  08/01/2018 FINDINGS: Brain: No evidence of acute infarction, hemorrhage, hydrocephalus, extra-axial collection or mass lesion/mass effect. Atrophy especially prominent in the temporal lobes in this patient with history of dementia. Chronic small vessel ischemia in the cerebral white matter. Vascular: Atherosclerotic calcification Skull: Negative for fracture  or erosion Sinuses/Orbits: Negative IMPRESSION: 1. No acute finding. 2. Atrophy in keeping with history of dementia. Electronically Signed   By: Monte Fantasia M.D.   On: 11/30/2018 10:55   Ct Renal Stone Study  Result Date: 11/30/2018 CLINICAL DATA:  UTI. EXAM: CT ABDOMEN AND PELVIS WITHOUT CONTRAST TECHNIQUE: Multidetector CT imaging of the abdomen and pelvis was performed following the standard protocol without IV contrast. COMPARISON:  CT abdomen pelvis dated September 26, 2018. FINDINGS: Lower chest: Unchanged trace bilateral pleural effusions and small pericardial effusion. Hepatobiliary: No focal liver abnormality is seen. Status post cholecystectomy. Unchanged mild dilatation of the common bile duct, likely due to post cholecystectomy state. Pancreas: Unremarkable. No pancreatic ductal dilatation or surrounding inflammatory changes. Spleen: Normal in size without focal abnormality. Adrenals/Urinary Tract: The adrenal glands are unremarkable. Prior left nephrectomy. Severely atrophic right kidney. Unchanged right lower quadrant transplant kidney without calculi or hydronephrosis. Similar appearing mild asymmetric bladder wall thickening and pericystic fat stranding. Stomach/Bowel: Stomach is within normal limits. Appendix is not definitely visualized, but there are no signs of inflammation at the base of the cecum. No evidence of bowel wall thickening, distention, or inflammatory changes. Vascular/Lymphatic: Aortic atherosclerosis. No enlarged abdominal or pelvic lymph nodes. Reproductive: Status post hysterectomy. No adnexal masses. Other: Prior ventral hernia repair. No recurrent hernia. No pneumoperitoneum or free fluid. Musculoskeletal: No acute or significant osseous findings. Chronic right femoral head avascular necrosis. IMPRESSION: 1. Similar appearing mild asymmetric bladder wall thickening and pericystic fat stranding. Correlate with urinalysis to exclude recurrent cystitis. 2. Unremarkable right  lower quadrant transplant kidney. No urolithiasis or hydronephrosis. 3. Unchanged trace bilateral pleural effusions and small pericardial effusion. 4.  Aortic atherosclerosis (ICD10-I70.0). Electronically Signed   By: Titus Dubin M.D.   On: 11/30/2018 10:57     CBC Recent Labs  Lab 11/30/18 1001 12/01/18 0356  WBC 3.9* 3.2*  HGB 8.6* 7.8*  HCT 28.0* 26.5*  PLT 171 148*  MCV 80.9 82.6  MCH 24.9* 24.3*  MCHC 30.7 29.4*  RDW 16.3* 16.1*  LYMPHSABS 0.9  --   MONOABS 0.5  --   EOSABS 0.2  --   BASOSABS 0.0  --     Chemistries  Recent Labs  Lab 11/30/18 1001 12/01/18 0356  NA 141 141  K 4.1 3.7  CL 105 113*  CO2 26 22  GLUCOSE 98 85  BUN 43* 33*  CREATININE 2.45* 2.06*  CALCIUM 8.7* 8.1*  MG  --  2.2  AST 14*  --   ALT 12  --   ALKPHOS 45  --   BILITOT 0.5  --    ------------------------------------------------------------------------------------------------------------------ estimated creatinine clearance is 26.5 mL/min (A) (by C-G formula based on SCr of 2.06 mg/dL (H)). ------------------------------------------------------------------------------------------------------------------ No results for input(s): HGBA1C in the last 72 hours. ------------------------------------------------------------------------------------------------------------------ No results for input(s): CHOL, HDL, LDLCALC, TRIG, CHOLHDL, LDLDIRECT in the last 72 hours. ------------------------------------------------------------------------------------------------------------------ No results for input(s): TSH, T4TOTAL, T3FREE, THYROIDAB in the last 72 hours.  Invalid input(s): FREET3 ------------------------------------------------------------------------------------------------------------------ Recent Labs    12/01/18 0356  RETICCTPCT 1.6    Coagulation profile No results for input(s): INR, PROTIME in the last 168 hours.  No results for input(s): DDIMER in the last 72  hours.  Cardiac Enzymes No results for input(s): CKMB, TROPONINI, MYOGLOBIN in the last 168 hours.  Invalid input(s): CK ------------------------------------------------------------------------------------------------------------------ Invalid input(s): [place]   Patient is a 68 year old female with history of dementia, gastroesophageal reflux disease, prior history of kidney transplant in 2007 at [facility] but patient currently following up with nephrology service at [facility] service with Dr. Lyndel Safe prior history of uterine cancer being admitted for UTI and mild acute kidney injury  1.  Urinary tract infection Continue IV Rocephin. Follow cultures  2.  Acute metabolic encephalopathy Secondary to UTI in the setting of underlying dementia. Treatment of UTI as outlined above.  Resume home dose of Aricept.  3.  Acute kidney injury Continue IV fluid follow urine culture   4.  Nonspecific twitching EEG pending no further episodes  5.  Acute anemia on chronic anemia of chronic disease we will check anemia panel guaiac her stools follow CBC        Code Status Orders  (From admission, onward)         Start     Ordered   11/30/18 1609  Full code  Continuous     11/30/18 1609        Code Status History    Date Active Date Inactive Code Status Order ID Comments User Context   11/07/2018 2147 11/09/2018 1840 Full Code [id number]  Vaughan Basta, MD Inpatient   09/22/2018 1852 09/27/2018 1908 Full Code [id number]  Dustin Flock, MD Inpatient   08/12/2018 1749 08/15/2018 1758 Full Code [id number]  Demetrios Loll, MD Inpatient   06/20/2018 0041 06/21/2018 1616 Full Code [id number]  Lance Coon, MD Inpatient   03/23/2018 0357 03/26/2018 1620 Full Code [id number]  Harrie Foreman, MD Inpatient   11/21/2017 0839 11/23/2017 1708 Full Code [id number]  Fritzi Mandes,  MD Inpatient   06/01/2017 1402 06/03/2017 1831 Full Code [id number]  Loletha Grayer,  MD ED   06/29/2016 2314 07/02/2016 1712 Full Code [id number]  Lance Coon, MD Inpatient   07/06/2014 2201 07/08/2014 1537 Full Code [id number]  Hower, Aaron Mose, MD ED   Advance Care Planning Activity           Consults none   DVT Prophylaxis Heparin  Lab Results  Component Value Date   PLT 148 (L) 12/01/2018     Time Spent in minutes 35 minutes  Greater than 50% of time spent in care coordination and counseling patient regarding the condition and plan of care.   Dustin Flock M.D on 12/01/2018 at 11:47 AM  Between 7am to 6pm - Pager - [phone]  After 6pm go to [email or web address] - Proofreader  [employer]   Office  [phone]

## 2018-12-01 NOTE — TOC Initial Note (Signed)
Transition of Care [facility]) - Initial/Assessment Note    Patient Details  Name: [name] MRN: [id number] Date of Birth: 1950-03-15  Transition of Care [facility]) CM/SW Contact:    Latanya Maudlin, RN Phone Number: 12/01/2018, 9:14 AM  Clinical Narrative: TOC consulted to complete high risk readmission assessment. Patient with some dementia. Most assessment completed by chart review and with MD/RN. Patient lives at home with her daughter who is her primary caregiver. Patient has private duty aides come in seven days a week from 09:00 to 1pm. Patient is active with [facility] care. Notified [place] of admission and plans for resumption of care at discharge. Patient has hospital bed, wheelchair and RW at home. TOC team will follow along for any additional needs.                    Expected Discharge Plan: [place] Barriers to Discharge: Continued Medical Work up   Patient Goals and CMS Choice   CMS Medicare.gov Compare Post Acute Care list provided to:: Patient Choice offered to / list presented to : Patient  Expected Discharge Plan and Services Expected Discharge Plan: [place] Choice: Home Health                             HH Arranged: RN [facility] Agency: [place] ([place]) Date [facility] Agency Contacted: 12/01/18 Time HH Agency Contacted: [phone] Representative spoke with at [place]: [place] Arrangements/Services   Lives with:: Adult Children              Current home services: DME    Activities of Daily Living Home Assistive Devices/Equipment: Cane (specify quad or straight), Walker (specify type) ADL Screening (condition at time of admission) Patient's cognitive ability adequate to safely complete daily activities?: Yes Is the patient deaf or have difficulty hearing?: No Does the patient have difficulty seeing, even when wearing glasses/contacts?: No Does the patient have difficulty  concentrating, remembering, or making decisions?: Yes Patient able to express need for assistance with ADLs?: Yes Does the patient have difficulty dressing or bathing?: Yes Independently performs ADLs?: Yes (appropriate for developmental age) Does the patient have difficulty walking or climbing stairs?: Yes Weakness of Legs: Both Weakness of Arms/Hands: None  Permission Sought/Granted                  Emotional Assessment              Admission diagnosis:  Jerking [R25.3] Acute cystitis with hematuria [N30.01] Patient Active Problem List   Diagnosis Date Noted  . Acute lower UTI 11/07/2018  . C. difficile colitis 11/07/2018  . Pressure injury of skin 09/23/2018  . Acute encephalopathy 09/22/2018  . Renal failure (ARF), acute on chronic ([place]) 08/12/2018  . Confusion 03/23/2018  . Hypokalemia 06/01/2017  . Sepsis ([place]) 06/29/2016  . UTI (urinary tract infection) 06/29/2016  . GERD (gastroesophageal reflux disease) 06/29/2016  . Dementia ([place]) 06/29/2016  . 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 Essential hypertension 07/06/2014  . H/O kidney transplant 07/06/2014   PCP:  Baxter Hire, MD Pharmacy:   [id number] [address], [place] [address] Phone: [phone] Fax: [place], [place]  [place] [address] Phone: [phone] Fax: [phone]     Social Determinants of Health (SDOH) Interventions    Readmission Risk Interventions Readmission Risk Prevention Plan 12/01/2018 08/15/2018  Transportation Screening Complete Complete  PCP or Specialist Appt within 3-5 Days - Complete  HRI or [place] - Complete  Palliative Care Screening - Not Applicable  Medication Review ([place]) - Complete  HRI or [place] Patient  Refused -  Some recent data might be hidden

## 2018-12-01 NOTE — Procedures (Addendum)
Patient Name: [name]  MRN: [id number]  Epilepsy Attending: Lora Havens  Referring Physician/Provider: Dr Otila Back Date: 11/30/2018  Duration: 22.49 mins  Patient history: 68yo F with ams. EEG to evaluate for seizure.   Level of alertness: awake  AEDs during EEG study: None  Technical aspects: This EEG study was done with scalp electrodes positioned according to the 10-20 International system of electrode placement. Electrical activity was acquired at a sampling rate of 500Hz  and reviewed with a high frequency filter of 70Hz  and a low frequency filter of 1Hz . EEG data were recorded continuously and digitally stored.   DESCRIPTION: EEG showed continuous generalized 2-5Hz  theta-delta slowing. No clear posterior dominant rhythm was seen.  Hyperventilation and photic stimulation were not performed.  ABNORMALITY - Continuous slow, generalized   IMPRESSION: This study is suggestive of moderate diffuse encephalopathy.  No seizures or epileptiform discharges were seen throughout the recording.  Lavon Horn Barbra Sarks

## 2018-12-01 NOTE — Progress Notes (Signed)
[facility]  ROUNDING NOTE   Subjective:  Patient well-known to [place] as we follow her for renal transplantation. She was last seen in office on Jul 05, 2018. Her baseline creatinine appears to be 2.06. Her creatinine is similar at the moment. Patient admitted now with altered mental status. She is currently being treated for a urinary tract infection. She has had a number of urinary tract infections.  Objective:  Vital signs in last 24 hours:  Temp:  [97.5 F (36.4 C)-97.9 F (36.6 C)] 97.7 F (36.5 C) (10/17 0918) Pulse Rate:  [52-60] 59 (10/17 0918) Resp:  [16-18] 18 (10/17 0918) BP: (130-157)/(58-74) 142/69 (10/17 0918) SpO2:  [89 %-100 %] 100 % (10/17 0918)  Weight change:  Filed Weights   11/30/18 0951  Weight: 95.7 kg    Intake/Output: I/O last 3 completed shifts: In: 1199.9 [I.V.:1199.9] Out: 350 [Urine:350]   Intake/Output this shift:  Total I/O In: 120 [P.O.:120] Out: -   Physical Exam: General: No acute distress  Head: Normocephalic, atraumatic. Moist oral mucosal membranes  Eyes: Anicteric  Neck: Supple, trachea midline  Lungs:  Clear to auscultation, normal effort  Heart: S1S2 no rubs  Abdomen:  Soft, nontender, bowel sounds present  Extremities: Trace peripheral edema.  Neurologic: Awake, alert, following commands  Skin: No lesions  Access:     Basic Metabolic Panel: Recent Labs  Lab 11/30/18 1001 12/01/18 0356  NA 141 141  K 4.1 3.7  CL 105 113*  CO2 26 22  GLUCOSE 98 85  BUN 43* 33*  CREATININE 2.45* 2.06*  CALCIUM 8.7* 8.1*  MG  --  2.2  PHOS  --  4.0    Liver Function Tests: Recent Labs  Lab 11/30/18 1001  AST 14*  ALT 12  ALKPHOS 45  BILITOT 0.5  PROT 7.0  ALBUMIN 3.5   No results for input(s): LIPASE, AMYLASE in the last 168 hours. No results for input(s): AMMONIA in the last 168 hours.  CBC: Recent Labs  Lab 11/30/18 1001 12/01/18 0356  WBC 3.9* 3.2*  NEUTROABS 2.4  --   HGB 8.6* 7.8*  HCT  28.0* 26.5*  MCV 80.9 82.6  PLT 171 148*    Cardiac Enzymes: No results for input(s): CKTOTAL, CKMB, CKMBINDEX, TROPONINI in the last 168 hours.  BNP: Invalid input(s): POCBNP  CBG: No results for input(s): GLUCAP in the last 168 hours.  Microbiology: Results for orders placed or performed during the hospital encounter of 11/30/18  Urine culture     Status: None   Collection Time: 11/30/18 10:21 AM   Specimen: Urine, Random  Result Value Ref Range Status   Specimen Description   Final    URINE, RANDOM Performed at [facility], [address] [place], [place] [address]    Special Requests   Final    NONE Performed at [facility], [address], [place], [place] [address]    Culture   Final    NO GROWTH Performed at [facility], [place] [address] [place], [place] [address]    Report Status 12/01/2018 FINAL  Final  Blood culture (routine x 2)     Status: None (Preliminary result)   Collection Time: 11/30/18 11:48 AM   Specimen: BLOOD  Result Value Ref Range Status   Specimen Description BLOOD RIGHT ANTECUBITAL  Final   Special Requests   Final    BOTTLES DRAWN AEROBIC AND ANAEROBIC Blood Culture adequate volume   Culture   Final  NO GROWTH < 24 HOURS Performed at [facility], [address] [place], [place] [address]    Report Status PENDING  Incomplete  Blood culture (routine x 2)     Status: None (Preliminary result)   Collection Time: 11/30/18 11:48 AM   Specimen: BLOOD  Result Value Ref Range Status   Specimen Description BLOOD BLOOD RIGHT HAND  Final   Special Requests   Final    BOTTLES DRAWN AEROBIC AND ANAEROBIC Blood Culture adequate volume   Culture   Final    NO GROWTH < 24 HOURS Performed at [facility], [place], [place] [address]    Report Status PENDING  Incomplete  SARS CORONAVIRUS 2 (TAT 6-24 HRS) Nasopharyngeal Nasopharyngeal Swab     Status: None   Collection Time: 11/30/18   2:42 PM   Specimen: Nasopharyngeal Swab  Result Value Ref Range Status   SARS Coronavirus 2 NEGATIVE NEGATIVE Final    Comment: (NOTE) SARS-CoV-2 target nucleic acids are NOT DETECTED. The SARS-CoV-2 RNA is generally detectable in upper and lower respiratory specimens during the acute phase of infection. Negative results do not preclude SARS-CoV-2 infection, do not rule out co-infections with other pathogens, and should not be used as the sole basis for treatment or other patient management decisions. Negative results must be combined with clinical observations, patient history, and epidemiological information. The expected result is Negative. Fact Sheet for Patients: SugarRoll.be Fact Sheet for Healthcare Providers: https://www.woods-mathews.com/ This test is not yet approved or cleared by the [place] FDA and  has been authorized for detection and/or diagnosis of SARS-CoV-2 by FDA under an Emergency Use Authorization (EUA). This EUA will remain  in effect (meaning this test can be used) for the duration of the COVID-19 declaration under Section 56 4(b)(1) of the Act, 21 U.S.C. section 360bbb-3(b)(1), unless the authorization is terminated or revoked sooner. Performed at [facility], [place] [address] [place], [place] [address]     Coagulation Studies: No results for input(s): LABPROT, INR in the last 72 hours.  Urinalysis: Recent Labs    11/30/18 1021  COLORURINE YELLOW*  LABSPEC 1.013  PHURINE 5.0  GLUCOSEU NEGATIVE  HGBUR LARGE*  BILIRUBINUR NEGATIVE  KETONESUR NEGATIVE  PROTEINUR 100*  NITRITE NEGATIVE  LEUKOCYTESUR LARGE*      Imaging: Ct Head Wo Contrast  Result Date: 11/30/2018 CLINICAL DATA:  Altered mental status EXAM: CT HEAD WITHOUT CONTRAST TECHNIQUE: Contiguous axial images were obtained from the base of the skull through the vertex without intravenous contrast. COMPARISON:  08/01/2018 FINDINGS:  Brain: No evidence of acute infarction, hemorrhage, hydrocephalus, extra-axial collection or mass lesion/mass effect. Atrophy especially prominent in the temporal lobes in this patient with history of dementia. Chronic small vessel ischemia in the cerebral white matter. Vascular: Atherosclerotic calcification Skull: Negative for fracture or erosion Sinuses/Orbits: Negative IMPRESSION: 1. No acute finding. 2. Atrophy in keeping with history of dementia. Electronically Signed   By: Monte Fantasia M.D.   On: 11/30/2018 10:55   Ct Renal Stone Study  Result Date: 11/30/2018 CLINICAL DATA:  UTI. EXAM: CT ABDOMEN AND PELVIS WITHOUT CONTRAST TECHNIQUE: Multidetector CT imaging of the abdomen and pelvis was performed following the standard protocol without IV contrast. COMPARISON:  CT abdomen pelvis dated September 26, 2018. FINDINGS: Lower chest: Unchanged trace bilateral pleural effusions and small pericardial effusion. Hepatobiliary: No focal liver abnormality is seen. Status post cholecystectomy. Unchanged mild dilatation of the common bile duct, likely due to post cholecystectomy state.  Pancreas: Unremarkable. No pancreatic ductal dilatation or surrounding inflammatory changes. Spleen: Normal in size without focal abnormality. Adrenals/Urinary Tract: The adrenal glands are unremarkable. Prior left nephrectomy. Severely atrophic right kidney. Unchanged right lower quadrant transplant kidney without calculi or hydronephrosis. Similar appearing mild asymmetric bladder wall thickening and pericystic fat stranding. Stomach/Bowel: Stomach is within normal limits. Appendix is not definitely visualized, but there are no signs of inflammation at the base of the cecum. No evidence of bowel wall thickening, distention, or inflammatory changes. Vascular/Lymphatic: Aortic atherosclerosis. No enlarged abdominal or pelvic lymph nodes. Reproductive: Status post hysterectomy. No adnexal masses. Other: Prior ventral hernia repair. No  recurrent hernia. No pneumoperitoneum or free fluid. Musculoskeletal: No acute or significant osseous findings. Chronic right femoral head avascular necrosis. IMPRESSION: 1. Similar appearing mild asymmetric bladder wall thickening and pericystic fat stranding. Correlate with urinalysis to exclude recurrent cystitis. 2. Unremarkable right lower quadrant transplant kidney. No urolithiasis or hydronephrosis. 3. Unchanged trace bilateral pleural effusions and small pericardial effusion. 4.  Aortic atherosclerosis (ICD10-I70.0). Electronically Signed   By: Titus Dubin M.D.   On: 11/30/2018 10:57     Medications:   . sodium chloride 100 mL/hr at 12/01/18 0401  . cefTRIAXone (ROCEPHIN)  IV 1 g (12/01/18 0939)   . atorvastatin  40 mg Oral Daily  . calcitRIOL  0.25 mcg Oral Daily  . calcium carbonate  400 mg of elemental calcium Oral BID  . cholecalciferol  1,000 Units Oral Daily  . citalopram  20 mg Oral Daily  . donepezil  5 mg Oral QHS  . feeding supplement (ENSURE ENLIVE)  237 mL Oral BID BM  . folic acid  1 mg Oral BID  . heparin  5,000 Units Subcutaneous Q8H  . Melatonin  20 mg Oral QHS  . metoprolol tartrate  25 mg Oral BID  . mycophenolate  180 mg Oral BID  . sirolimus  3 mg Oral Daily  . traZODone  50 mg Oral QHS     Assessment/ Plan:  68 y.o. female with past medical history of ESRD with renal transplantation in Dec 13, 2007 with deceased donor performed at [facility], left renal cell carcinoma status post nephrectomy, hypertension, dementia, history of multiple UTIs, chronic diarrhea, history of cervical cancer status post hysterectomy, history of pericardial effusion who presents now with altered mental status.  1.  ESRD status post renal transplantation 12-13-2007.  We will maintain the patient on sirolimus 3 mg daily, mycophenolate 180 mg twice daily.  She was previously taken off of prednisone.  Her creatinine now appears to be at her baseline.  Continue to  monitor renal parameters daily for now.  2.  Secondary hyperparathyroidism.  Maintain the patient on Calcitrol 0.25 mcg p.o. daily.  3.  Urinary tract infection.  Patient apparently has had multiple urinary tract infections recently.  Maintain the patient on ceftriaxone at this time.  4.  Thanks for consultation.   LOS: 1 Ramyah Pankowski 10/17/20202:27 PM

## 2018-12-01 NOTE — Progress Notes (Signed)
Physical Therapy Evaluation Patient Details Name: [name] MRN: [id number] DOB: [birth year] Today's Date: 12/01/2018   History of Present Illness    [name]  is a 68 y.o. female with a known history of dementia,UTI, and decreased renal function.   Clinical Impression  Patient is able to perform bed mobility with min assist and is able to sit at edge of bed without UE support. She is not able to perform transfer or stand with RW due to inability to follow verbal commands. She will continue to benefit from skilled PT to improve strength and mobility.     Follow Up Recommendations Home health PT    Equipment Recommendations  None recommended by PT    Recommendations for Other Services       Precautions / Restrictions Restrictions Weight Bearing Restrictions: No      Mobility  Bed Mobility Overal bed mobility: Needs Assistance Bed Mobility: Rolling;Supine to Sit;Sit to Supine Rolling: Min guard   Supine to sit: Min assist Sit to supine: Min assist      Transfers Overall transfer level: (unable for patient to understand/follow cues to stand up)                  Ambulation/Gait Ambulation/Gait assistance: (unable to assess due to poor cognition to follow cues)              Stairs            Wheelchair Mobility    Modified Rankin (Stroke Patients Only)       Balance Overall balance assessment: Modified Independent                                           Pertinent Vitals/Pain Pain Assessment: No/denies pain    Home Living Family/patient expects to be discharged to:: Private residence Living Arrangements: Children Available Help at Discharge: Family Type of Home: House Home Access: Other (comment)     Home Layout: (unable to get an answer from patient)        Prior Function Level of Independence: (unsure)               Hand Dominance        Extremity/Trunk Assessment   Upper Extremity  Assessment Upper Extremity Assessment: Overall WFL for tasks assessed    Lower Extremity Assessment Lower Extremity Assessment: Generalized weakness       Communication   Communication: No difficulties  Cognition Arousal/Alertness: Awake/alert Behavior During Therapy: WFL for tasks assessed/performed Overall Cognitive Status: No family/caregiver present to determine baseline cognitive functioning                                        General Comments      Exercises     Assessment/Plan    PT Assessment Patient needs continued PT services  PT Problem List Decreased strength;Decreased mobility       PT Treatment Interventions Gait training;Therapeutic exercise;Balance training    PT Goals (Current goals can be found in the Care Plan section)  Acute Rehab PT Goals Patient Stated Goal: no goals stated PT Goal Formulation: Patient unable to participate in goal setting Time For Goal Achievement: 12/15/18 Potential to Achieve Goals: Fair    Frequency Min 2X/week   Barriers to discharge  Co-evaluation               AM-PAC PT "6 Clicks" Mobility  Outcome Measure Help needed turning from your back to your side while in a flat bed without using bedrails?: A Little Help needed moving from lying on your back to sitting on the side of a flat bed without using bedrails?: A Little Help needed moving to and from a bed to a chair (including a wheelchair)?: A Lot Help needed standing up from a chair using your arms (e.g., wheelchair or bedside chair)?: A Lot Help needed to walk in hospital room?: Total Help needed climbing 3-5 steps with a railing? : Total 6 Click Score: 12    End of Session Equipment Utilized During Treatment: Gait belt;Other (comment)   Patient left: with bed alarm set   PT Visit Diagnosis: Muscle weakness (generalized) (M62.81)    Time: 1115-1140 PT Time Calculation (min) (ACUTE ONLY): 25 min   Charges:     PT  Treatments $Therapeutic Activity: 8-22 mins          Alanson Puls, PT DPT 12/01/2018, 1:59 PM

## 2018-12-02 LAB — BASIC METABOLIC PANEL
Anion gap: 6 (ref 5–15)
BUN: 31 mg/dL — ABNORMAL HIGH (ref 8–23)
CO2: 23 mmol/L (ref 22–32)
Calcium: 8.3 mg/dL — ABNORMAL LOW (ref 8.9–10.3)
Chloride: 114 mmol/L — ABNORMAL HIGH (ref 98–111)
Creatinine, Ser: 1.93 mg/dL — ABNORMAL HIGH (ref 0.44–1.00)
GFR calc Af Amer: 30 mL/min — ABNORMAL LOW (ref >60)
GFR calc non Af Amer: 26 mL/min — ABNORMAL LOW (ref >60)
Glucose, Bld: 88 mg/dL (ref 70–99)
Potassium: 3.6 mmol/L (ref 3.5–5.1)
Sodium: 143 mmol/L (ref 135–145)

## 2018-12-02 LAB — CBC
HCT: 25.2 % — ABNORMAL LOW (ref 36.0–46.0)
Hemoglobin: 7.5 g/dL — ABNORMAL LOW (ref 12.0–15.0)
MCH: 24.4 pg — ABNORMAL LOW (ref 26.0–34.0)
MCHC: 29.8 g/dL — ABNORMAL LOW (ref 30.0–36.0)
MCV: 82.1 fL (ref 80.0–100.0)
Platelets: 143 10*3/uL — ABNORMAL LOW (ref 150–400)
RBC: 3.07 MIL/uL — ABNORMAL LOW (ref 3.87–5.11)
RDW: 16 % — ABNORMAL HIGH (ref 11.5–15.5)
WBC: 3.8 10*3/uL — ABNORMAL LOW (ref 4.0–10.5)
nRBC: 0 % (ref 0.0–0.2)

## 2018-12-02 LAB — PREPARE RBC (CROSSMATCH)

## 2018-12-02 MED ORDER — CYANOCOBALAMIN 1000 MCG/ML IJ SOLN
1000.0000 ug | Freq: Once | INTRAMUSCULAR | Status: AC
  Administered 2018-12-02: 1000 ug via INTRAMUSCULAR
  Filled 2018-12-02: qty 1

## 2018-12-02 MED ORDER — SODIUM CHLORIDE 0.9% IV SOLUTION
Freq: Once | INTRAVENOUS | Status: AC
  Administered 2018-12-02: 14:00:00 via INTRAVENOUS

## 2018-12-02 MED ORDER — VITAMIN B-12 1000 MCG PO TABS: 1000.0000 ug | ORAL_TABLET | Freq: Every day | ORAL | 0 refills | Status: DC

## 2018-12-02 MED ORDER — CEFUROXIME AXETIL 500 MG PO TABS: 500.0000 mg | ORAL_TABLET | Freq: Two times a day (BID) | ORAL | 0 refills | Status: AC

## 2018-12-02 NOTE — Progress Notes (Signed)
[facility]  ROUNDING NOTE   Subjective:  The patient's renal function appears to be stable. Actually creatinine improved down to 1.93 at the moment. This is actually below her prior baseline. Overall feeling better today.  Objective:  Vital signs in last 24 hours:  Temp:  [97.4 F (36.3 C)-98.3 F (36.8 C)] 97.4 F (36.3 C) (10/18 1434) Pulse Rate:  [48-74] 48 (10/18 1434) Resp:  [16-20] 19 (10/18 1434) BP: (115-148)/(55-78) 133/63 (10/18 1434) SpO2:  [96 %-99 %] 99 % (10/18 1434)  Weight change:  Filed Weights   11/30/18 0951  Weight: 95.7 kg    Intake/Output: I/O last 3 completed shifts: In: 2478 [P.O.:360; I.V.:2018; IV Piggyback:100] Out: 5852 [Urine:1350]   Intake/Output this shift:  No intake/output data recorded.  Physical Exam: General: No acute distress  Head: Normocephalic, atraumatic. Moist oral mucosal membranes  Eyes: Anicteric  Neck: Supple, trachea midline  Lungs:  Clear to auscultation, normal effort  Heart: S1S2 no rubs  Abdomen:  Soft, nontender, bowel sounds present  Extremities: Trace peripheral edema.  Neurologic: Awake, alert, following commands  Skin: No lesions       Basic Metabolic Panel: Recent Labs  Lab 11/30/18 1001 12/01/18 0356 12/02/18 0444  NA 141 141 143  K 4.1 3.7 3.6  CL 105 113* 114*  CO2 26 22 23   GLUCOSE 98 85 88  BUN 43* 33* 31*  CREATININE 2.45* 2.06* 1.93*  CALCIUM 8.7* 8.1* 8.3*  MG  --  2.2  --   PHOS  --  4.0  --     Liver Function Tests: Recent Labs  Lab 11/30/18 1001  AST 14*  ALT 12  ALKPHOS 45  BILITOT 0.5  PROT 7.0  ALBUMIN 3.5   No results for input(s): LIPASE, AMYLASE in the last 168 hours. No results for input(s): AMMONIA in the last 168 hours.  CBC: Recent Labs  Lab 11/30/18 1001 12/01/18 0356 12/02/18 0444  WBC 3.9* 3.2* 3.8*  NEUTROABS 2.4  --   --   HGB 8.6* 7.8* 7.5*  HCT 28.0* 26.5* 25.2*  MCV 80.9 82.6 82.1  PLT 171 148* 143*    Cardiac Enzymes: No  results for input(s): CKTOTAL, CKMB, CKMBINDEX, TROPONINI in the last 168 hours.  BNP: Invalid input(s): POCBNP  CBG: No results for input(s): GLUCAP in the last 168 hours.  Microbiology: Results for orders placed or performed during the hospital encounter of 11/30/18  Urine culture     Status: None   Collection Time: 11/30/18 10:21 AM   Specimen: Urine, Random  Result Value Ref Range Status   Specimen Description   Final    URINE, RANDOM Performed at [facility], [address] [place], [place] [address]    Special Requests   Final    NONE Performed at [facility], [address] [place], [place] [address]    Culture   Final    NO GROWTH Performed at [facility], [place] [address], [place], [place] [address]    Report Status 12/01/2018 FINAL  Final  Blood culture (routine x 2)     Status: None (Preliminary result)   Collection Time: 11/30/18 11:48 AM   Specimen: BLOOD  Result Value Ref Range Status   Specimen Description BLOOD RIGHT ANTECUBITAL  Final   Special Requests   Final    BOTTLES DRAWN AEROBIC AND ANAEROBIC Blood Culture adequate volume   Culture   Final    NO GROWTH 2 DAYS Performed at  [facility], [address], [place], [place] [address]    Report Status PENDING  Incomplete  Blood culture (routine x 2)     Status: None (Preliminary result)   Collection Time: 11/30/18 11:48 AM   Specimen: BLOOD  Result Value Ref Range Status   Specimen Description BLOOD BLOOD RIGHT HAND  Final   Special Requests   Final    BOTTLES DRAWN AEROBIC AND ANAEROBIC Blood Culture adequate volume   Culture   Final    NO GROWTH 2 DAYS Performed at [facility], [address], [place], [place] [address]    Report Status PENDING  Incomplete  SARS CORONAVIRUS 2 (TAT 6-24 HRS) Nasopharyngeal Nasopharyngeal Swab     Status: None   Collection Time: 11/30/18  2:42 PM   Specimen: Nasopharyngeal Swab  Result Value Ref Range Status    SARS Coronavirus 2 NEGATIVE NEGATIVE Final    Comment: (NOTE) SARS-CoV-2 target nucleic acids are NOT DETECTED. The SARS-CoV-2 RNA is generally detectable in upper and lower respiratory specimens during the acute phase of infection. Negative results do not preclude SARS-CoV-2 infection, do not rule out co-infections with other pathogens, and should not be used as the sole basis for treatment or other patient management decisions. Negative results must be combined with clinical observations, patient history, and epidemiological information. The expected result is Negative. Fact Sheet for Patients: SugarRoll.be Fact Sheet for Healthcare Providers: https://www.woods-mathews.com/ This test is not yet approved or cleared by the [place] FDA and  has been authorized for detection and/or diagnosis of SARS-CoV-2 by FDA under an Emergency Use Authorization (EUA). This EUA will remain  in effect (meaning this test can be used) for the duration of the COVID-19 declaration under Section 56 4(b)(1) of the Act, 21 U.S.C. section 360bbb-3(b)(1), unless the authorization is terminated or revoked sooner. Performed at [facility], [place] [address], [place],  [address]     Coagulation Studies: No results for input(s): LABPROT, INR in the last 72 hours.  Urinalysis: Recent Labs    11/30/18 1021  COLORURINE YELLOW*  LABSPEC 1.013  PHURINE 5.0  GLUCOSEU NEGATIVE  HGBUR LARGE*  BILIRUBINUR NEGATIVE  KETONESUR NEGATIVE  PROTEINUR 100*  NITRITE NEGATIVE  LEUKOCYTESUR LARGE*      Imaging: No results found.   Medications:   . sodium chloride 100 mL/hr at 12/02/18 1207  . cefTRIAXone (ROCEPHIN)  IV 1 g (12/02/18 1310)   . atorvastatin  40 mg Oral Daily  . calcitRIOL  0.25 mcg Oral Daily  . calcium carbonate  400 mg of elemental calcium Oral BID  . cholecalciferol  1,000 Units Oral Daily  . citalopram  20 mg Oral Daily  .  donepezil  5 mg Oral QHS  . feeding supplement (ENSURE ENLIVE)  237 mL Oral BID BM  . folic acid  1 mg Oral BID  . heparin  5,000 Units Subcutaneous Q8H  . Melatonin  20 mg Oral QHS  . metoprolol tartrate  25 mg Oral BID  . mycophenolate  180 mg Oral BID  . sirolimus  3 mg Oral Daily  . traZODone  50 mg Oral QHS     Assessment/ Plan:  68 y.o. female with past medical history of ESRD with renal transplantation in 12-23-07 with deceased donor performed at [facility], left renal cell carcinoma status post nephrectomy, hypertension, dementia, history of multiple UTIs, chronic diarrhea, history of cervical cancer status post hysterectomy, history of pericardial effusion who presents  now with altered mental status.  1.  ESRD status post renal transplantation October 2009.  We plan to continue sirolimus 3 mg daily, mycophenolate 180 mg twice daily.  Continue to monitor renal parameters as an outpatient.  2.  Secondary hyperparathyroidism.  Maintain the patient on Calcitrol 0.25 mcg p.o. daily.  3.  Urinary tract infection.  Patient being treated with ceftriaxone while here.     LOS: 2 Daxter Paule 10/18/20202:42 PM

## 2018-12-02 NOTE — Discharge Summary (Signed)
[employer] [facility] [place] [facility], 68 y.o., DOB Jan 11, 1951, MRN [id number]. Admission date: 11/30/2018 Discharge Date 12/02/2018 Primary MD Baxter Hire, MD Admitting Physician Jude Stark Jock, MD  Admission Diagnosis  Jerking [R25.3] Acute cystitis with hematuria [N30.01]  Discharge Diagnosis   Active Problems:   UTI (urinary tract infection) Acute metabolic encephalopathy    Acute kidney injury Nonspecific twitching Acute anemia chronic anemia     Hospital Course disease,prior history of kidney transplant in 2007 at [facility] ,[place] but patient currently following up with nephrology service at [facility] transplant service with Dr.Detwelerprior history of uterine cancer being admitted for UTI and mild acute kidney injury.  Patient was admitted given IV fluids.  Also started on antibiotics for UTI.  Patient is doing much better.  Prior to her discharge she will receive 1 unit of packed RBCs.  Patient does have a history of anemia requiring transfusion in the previous admission.  We will have hematology follow the patient as outpatient.             Consults    Significant Tests:  See full reports for all details     Ct Head Wo Contrast  Result Date: 11/30/2018 CLINICAL DATA:  Altered mental status EXAM: CT HEAD WITHOUT CONTRAST TECHNIQUE: Contiguous axial images were obtained from the base of the skull through the vertex without intravenous contrast. COMPARISON:  08/01/2018 FINDINGS: Brain: No evidence of acute infarction, hemorrhage, hydrocephalus, extra-axial collection or mass lesion/mass effect. Atrophy especially prominent in the temporal lobes in this patient with history of dementia. Chronic small vessel ischemia in the cerebral white matter. Vascular: Atherosclerotic calcification Skull: Negative for fracture or erosion Sinuses/Orbits: Negative IMPRESSION: 1. No acute finding. 2. Atrophy in keeping with history of dementia.  Electronically Signed   By: Monte Fantasia M.D.   On: 11/30/2018 10:55   Ct Renal Stone Study  Result Date: 11/30/2018 CLINICAL DATA:  UTI. EXAM: CT ABDOMEN AND PELVIS WITHOUT CONTRAST TECHNIQUE: Multidetector CT imaging of the abdomen and pelvis was performed following the standard protocol without IV contrast. COMPARISON:  CT abdomen pelvis dated September 26, 2018. FINDINGS: Lower chest: Unchanged trace bilateral pleural effusions and small pericardial effusion. Hepatobiliary: No focal liver abnormality is seen. Status post cholecystectomy. Unchanged mild dilatation of the common bile duct, likely due to post cholecystectomy state. Pancreas: Unremarkable. No pancreatic ductal dilatation or surrounding inflammatory changes. Spleen: Normal in size without focal abnormality. Adrenals/Urinary Tract: The adrenal glands are unremarkable. Prior left nephrectomy. Severely atrophic right kidney. Unchanged right lower quadrant transplant kidney without calculi or hydronephrosis. Similar appearing mild asymmetric bladder wall thickening and pericystic fat stranding. Stomach/Bowel: Stomach is within normal limits. Appendix is not definitely visualized, but there are no signs of inflammation at the base of the cecum. No evidence of bowel wall thickening, distention, or inflammatory changes. Vascular/Lymphatic: Aortic atherosclerosis. No enlarged abdominal or pelvic lymph nodes. Reproductive: Status post hysterectomy. No adnexal masses. Other: Prior ventral hernia repair. No recurrent hernia. No pneumoperitoneum or free fluid. Musculoskeletal: No acute or significant osseous findings. Chronic right femoral head avascular necrosis. IMPRESSION: 1. Similar appearing mild asymmetric bladder wall thickening and pericystic fat stranding. Correlate with urinalysis to exclude recurrent cystitis. 2. Unremarkable right lower quadrant transplant kidney. No urolithiasis or hydronephrosis. 3. Unchanged trace bilateral pleural effusions  and small pericardial effusion. 4.  Aortic atherosclerosis (ICD10-I70.0). Electronically Signed   By: Titus Dubin M.D.   On: 11/30/2018 10:57  Today   Subjective:   [name]  Objective:   Blood pressure (!) 148/78, pulse 68, temperature 98.1 F (36.7 C), temperature source Oral, resp. rate 16, height 4\' 11"  (1.499 m), weight 95.7 kg, SpO2 98 %.  .  Intake/Output Summary (Last 24 hours) at 12/02/2018 1059 Last data filed at 12/02/2018 0628 Gross per 24 hour  Intake 1158.08 ml  Output 1000 ml  Net 158.08 ml    Exam VITAL SIGNS: Blood pressure (!) 148/78, pulse 68, temperature 98.1 F (36.7 C), temperature source Oral, resp. rate 16, height 4\' 11"  (1.499 m), weight 95.7 kg, SpO2 98 %.  GENERAL:  68 y.o.-year-old patient lying in the bed with no acute distress.  EYES: Pupils equal, round, reactive to light and accommodation. No scleral icterus. Extraocular muscles intact.  HEENT: Head atraumatic, normocephalic. Oropharynx and nasopharynx clear.  NECK:  Supple, no jugular venous distention. No thyroid enlargement, no tenderness.  LUNGS: Normal breath sounds bilaterally, no wheezing, rales,rhonchi or crepitation. No use of accessory muscles of respiration.  CARDIOVASCULAR: S1, S2 normal. No murmurs, rubs, or gallops.  ABDOMEN: Soft, nontender, nondistended. Bowel sounds present. No organomegaly or mass.  EXTREMITIES: No pedal edema, cyanosis, or clubbing.  NEUROLOGIC: Cranial nerves II through XII are intact. Muscle strength 5/5 in all extremities. Sensation intact. Gait not checked.  PSYCHIATRIC: The patient is alert and oriented x 3.  SKIN: No obvious rash, lesion, or ulcer.   Data Review     CBC w Diff:  Lab Results  Component Value Date   WBC 3.8 (L) 12/02/2018   HGB 7.5 (L) 12/02/2018   HGB 9.1 (L) 02/23/2014   HCT 25.2 (L) 12/02/2018   HCT 28.5 (L) 02/23/2014   PLT 143 (L) 12/02/2018   PLT 115 (L)  02/23/2014   LYMPHOPCT 24 11/30/2018   LYMPHOPCT 6.6 02/23/2014   MONOPCT 11 11/30/2018   MONOPCT 11.6 02/23/2014   EOSPCT 4 11/30/2018   EOSPCT 0.5 02/23/2014   BASOPCT 1 11/30/2018   BASOPCT 0.9 02/23/2014   CMP:  Lab Results  Component Value Date   NA 143 12/02/2018   NA 142 02/25/2014   K 3.6 12/02/2018   K 3.3 (L) 02/25/2014   CL 114 (H) 12/02/2018   CL 110 (H) 02/25/2014   CO2 23 12/02/2018   CO2 24 02/25/2014   BUN 31 (H) 12/02/2018   BUN 11 02/25/2014   CREATININE 1.93 (H) 12/02/2018   CREATININE 1.22 02/25/2014   PROT 7.0 11/30/2018   PROT 7.0 02/21/2014   ALBUMIN 3.5 11/30/2018   ALBUMIN 2.1 (L) 02/23/2014   BILITOT 0.5 11/30/2018   BILITOT 0.5 02/21/2014   ALKPHOS 45 11/30/2018   ALKPHOS 63 02/21/2014   AST 14 (L) 11/30/2018   AST 21 02/21/2014   ALT 12 11/30/2018   ALT 14 02/21/2014  .  Micro Results Recent Results (from the past 240 hour(s))  Urine culture     Status: None   Collection Time: 11/30/18 10:21 AM   Specimen: Urine, Random  Result Value Ref Range Status   Specimen Description   Final    URINE, RANDOM Performed at [facility], [address] [place], [place] [address]    Special Requests   Final    NONE Performed at [facility], [address], [place], [place] [address]    Culture   Final    NO GROWTH Performed at [facility], [place] [address], [place], [place]  [address]    Report Status 12/01/2018 FINAL  Final  Blood culture (routine x 2)     Status: None (Preliminary result)   Collection Time: 11/30/18 11:48 AM   Specimen: BLOOD  Result Value Ref Range Status   Specimen Description BLOOD RIGHT ANTECUBITAL  Final   Special Requests   Final    BOTTLES DRAWN AEROBIC AND ANAEROBIC Blood Culture adequate volume   Culture   Final    NO GROWTH 2 DAYS Performed at [facility], [address], [place], [place] [address]    Report Status PENDING  Incomplete  Blood culture (routine x 2)      Status: None (Preliminary result)   Collection Time: 11/30/18 11:48 AM   Specimen: BLOOD  Result Value Ref Range Status   Specimen Description BLOOD BLOOD RIGHT HAND  Final   Special Requests   Final    BOTTLES DRAWN AEROBIC AND ANAEROBIC Blood Culture adequate volume   Culture   Final    NO GROWTH 2 DAYS Performed at [facility], [address] [place], [place] [address]    Report Status PENDING  Incomplete  SARS CORONAVIRUS 2 (TAT 6-24 HRS) Nasopharyngeal Nasopharyngeal Swab     Status: None   Collection Time: 11/30/18  2:42 PM   Specimen: Nasopharyngeal Swab  Result Value Ref Range Status   SARS Coronavirus 2 NEGATIVE NEGATIVE Final    Comment: (NOTE) SARS-CoV-2 target nucleic acids are NOT DETECTED. The SARS-CoV-2 RNA is generally detectable in upper and lower respiratory specimens during the acute phase of infection. Negative results do not preclude SARS-CoV-2 infection, do not rule out co-infections with other pathogens, and should not be used as the sole basis for treatment or other patient management decisions. Negative results must be combined with clinical observations, patient history, and epidemiological information. The expected result is Negative. Fact Sheet for Patients: SugarRoll.be Fact Sheet for Healthcare Providers: https://www.woods-mathews.com/ This test is not yet approved or cleared by the [place] FDA and  has been authorized for detection and/or diagnosis of SARS-CoV-2 by FDA under an Emergency Use Authorization (EUA). This EUA will remain  in effect (meaning this test can be used) for the duration of the COVID-19 declaration under Section 56 4(b)(1) of the Act, 21 U.S.C. section 360bbb-3(b)(1), unless the authorization is terminated or revoked sooner. Performed at [facility], [place] [address] [place],  [address]         Code Status Orders  (From admission, onward)          Start     Ordered   11/30/18 1609  Full code  Continuous     11/30/18 1609        Code Status History    Date Active Date Inactive Code Status Order ID Comments User Context   11/07/2018 2147 11/09/2018 1840 Full Code [id number]  Vaughan Basta, MD Inpatient   09/22/2018 1852 09/27/2018 1908 Full Code [id number]  Dustin Flock, MD Inpatient   08/12/2018 1749 08/15/2018 1758 Full Code [id number]  Demetrios Loll, MD Inpatient   06/20/2018 0041 06/21/2018 1616 Full Code [id number]  Lance Coon, MD Inpatient   03/23/2018 0357 03/26/2018 1620 Full Code [id number]  Harrie Foreman, MD Inpatient   11/21/2017 0839 11/23/2017 1708 Full Code [id number]  Fritzi Mandes, MD Inpatient   06/01/2017 1402 06/03/2017 1831 Full Code [id number]  Loletha Grayer, MD ED   06/29/2016 2314 07/02/2016 1712 Full Code [id number]  Lance Coon, MD Inpatient   07/06/2014 2201 07/08/2014  [place] Full Code [id number]  Hower, Aaron Mose, MD ED   Advance Care Planning Activity          Follow-up Information    Baxter Hire, MD. Schedule an appointment as soon as possible for a visit in 6 days.   Specialty: Internal Medicine Contact information: [place] [address] [phone]        Cammie Sickle, MD. Schedule an appointment as soon as possible for a visit in 2 weeks.   Specialties: Internal Medicine, Oncology Why: anemia f/u Contact information: [place] [address] [phone]           Discharge Medications   Allergies as of 12/02/2018      Reactions   Ace Inhibitors Cough   Latex Swelling   Lisinopril Cough      Medication List    TAKE these medications   atorvastatin 40 MG tablet Commonly known as: LIPITOR Take 40 mg by mouth daily.   calcitRIOL 0.25 MCG capsule Commonly known as: ROCALTROL Take 0.25 mcg by mouth daily.   cefUROXime 500 MG tablet Commonly known as: CEFTIN Take 1 tablet (500 mg total) by mouth 2 (two) times daily for 4  days.   cholecalciferol 25 MCG (1000 UT) tablet Commonly known as: VITAMIN D3 Take 1,000 Units by mouth daily.   citalopram 20 MG tablet Commonly known as: CELEXA Take 20 mg by mouth daily.   donepezil 5 MG tablet Commonly known as: ARICEPT Take 5 mg by mouth at bedtime.   feeding supplement (ENSURE ENLIVE) Liqd Take 237 mLs by mouth 2 (two) times daily between meals.   folic acid 1 MG tablet Commonly known as: FOLVITE Take 1 mg by mouth 2 (two) times daily.   furosemide 20 MG tablet Commonly known as: LASIX Take 1 tablet (20 mg total) by mouth daily.   gabapentin 100 MG capsule Commonly known as: NEURONTIN Take 100-200 mg by mouth 2 (two) times daily.   Melatonin 10 MG Tabs Take 20 mg by mouth at bedtime.   meloxicam 15 MG tablet Commonly known as: MOBIC Take 15 mg by mouth daily.   metoprolol tartrate 25 MG tablet Commonly known as: LOPRESSOR Take 25 mg by mouth 2 (two) times daily.   mycophenolate 180 MG EC tablet Commonly known as: MYFORTIC Take 180 mg by mouth 2 (two) times daily.   potassium chloride 10 MEQ tablet Commonly known as: KLOR-CON Take 2 tablets (20 mEq total) by mouth daily. ONLY WHILE TAKING LASIX What changed:   when to take this  additional instructions   sirolimus 1 MG tablet Commonly known as: RAPAMUNE Take 3 mg by mouth daily.   traZODone 50 MG tablet Commonly known as: DESYREL Take 50 mg by mouth at bedtime.   Tums Ultra 1000 400 MG chewable tablet Generic drug: calcium elemental as carbonate Chew 1,000 mg by mouth 2 (two) times daily.   vitamin B-12 1000 MCG tablet Commonly known as: CYANOCOBALAMIN Take 1 tablet (1,000 mcg total) by mouth daily.          Total Time in preparing paper work, data evaluation and todays exam - 74 minutes  Dustin Flock M.D on 12/02/2018 at [place]  [phone]

## 2018-12-02 NOTE — Progress Notes (Signed)
Patient discharged home via husband

## 2018-12-02 NOTE — TOC Transition Note (Signed)
Transition of Care [facility]) - CM/SW Discharge Note   Patient Details  Name: [name] MRN: [id number] Date of Birth: March 06, 1950  Transition of [facility] CM/SW Contact:  Latanya Maudlin, RN Phone Number: 12/02/2018, 9:18 AM   Clinical Narrative:  Patient to be discharged per MD order. Orders in place for home health services. Patient is active with [facility] and wishes to resume. Notified [place] at [facility] of resumption of care orders. No DME needs. Family to transport.     Final next level of care: Home w Home Health Services Barriers to Discharge: No Barriers Identified   Patient Goals and CMS Choice   CMS Medicare.gov Compare Post Acute Care list provided to:: Patient Choice offered to / list presented to : Patient  Discharge Placement                       Discharge Plan and Services     Post Acute Care Choice: Home Health                    HH Arranged: RN, PT, Nurse's Aide [facility] Agency: [place] ([facility]) Date [facility] Agency Contacted: 12/02/18 Time [place]: [phone] Representative spoke with at [place]: [place] ([place]) Interventions     Readmission Risk Interventions Readmission Risk Prevention Plan 12/02/2018 12/01/2018 08/15/2018  Transportation Screening Complete Complete Complete  PCP or Specialist Appt within 3-5 Days - - Complete  HRI or Home Care Consult - - Complete  Palliative Care Screening - - Not Applicable  Medication Review ([place]) Complete - Complete  PCP or Specialist appointment within 3-5 days of discharge Complete - -  [place] or Home Care Consult Complete Complete -  SW Recovery Care/Counseling Consult Patient refused - -  Palliative Care Screening Not Applicable - -  [place] Not Applicable Patient Refused -  Some recent data might be hidden

## 2018-12-02 NOTE — Plan of Care (Signed)
  Problem: Urinary Elimination: Goal: Signs and symptoms of infection will decrease 12/02/2018 2012 by Jenene Slicker, RN Outcome: Adequate for Discharge 12/02/2018 2012 by Jenene Slicker, RN Outcome: Adequate for Discharge   Problem: Education: Goal: Knowledge of General Education information will improve Description: Including pain rating scale, medication(s)/side effects and non-pharmacologic comfort measures 12/02/2018 2012 by Jenene Slicker, RN Outcome: Adequate for Discharge 12/02/2018 2012 by Jenene Slicker, RN Outcome: Adequate for Discharge   Problem: Clinical Measurements: Goal: Ability to maintain clinical measurements within normal limits will improve 12/02/2018 2012 by Jenene Slicker, RN Outcome: Adequate for Discharge 12/02/2018 2012 by Jenene Slicker, RN Outcome: Adequate for Discharge   Problem: Activity: Goal: Risk for activity intolerance will decrease 12/02/2018 2012 by Jenene Slicker, RN Outcome: Adequate for Discharge 12/02/2018 2012 by Jenene Slicker, RN Outcome: Adequate for Discharge   Problem: Nutrition: Goal: Adequate nutrition will be maintained 12/02/2018 2012 by Jenene Slicker, RN Outcome: Adequate for Discharge 12/02/2018 2012 by Jenene Slicker, RN Outcome: Adequate for Discharge   Problem: Safety: Goal: Ability to remain free from injury will improve 12/02/2018 2012 by Jenene Slicker, RN Outcome: Adequate for Discharge 12/02/2018 2012 by Jenene Slicker, RN Outcome: Adequate for Discharge   Problem: Skin Integrity: Goal: Risk for impaired skin integrity will decrease 12/02/2018 2012 by Jenene Slicker, RN Outcome: Adequate for Discharge 12/02/2018 2012 by Jenene Slicker, RN Outcome: Adequate for Discharge

## 2018-12-03 LAB — BPAM RBC
Blood Product Expiration Date: 202011142359
ISSUE DATE / TIME: 202010181504
Unit Type and Rh: 6200

## 2018-12-03 LAB — TYPE AND SCREEN
ABO/RH(D): A POS
Antibody Screen: NEGATIVE
Unit division: 0

## 2018-12-03 LAB — MYCOPHENOLIC ACID (CELLCEPT)
MPA Glucuronide: 26 ug/mL (ref 15–125)
MPA: 1.9 ug/mL (ref 1.0–3.5)

## 2018-12-03 LAB — SIROLIMUS LEVEL: Sirolimus (Rapamycin): 6.1 ng/mL (ref 3.0–20.0)

## 2018-12-03 NOTE — Progress Notes (Signed)
PRESTYN, MAHN ([id number]) Visit Report for 11/22/2018 Chief Complaint Document Details Patient Name: [name], [name] Date of Service: 11/22/2018 2:15 PM Medical Record Number: [id number] Patient Account Number: [id number] Date of Birth/Sex: 1951/01/04 (67 y.o. F) Treating RN: Army Melia Primary Care Provider: Harrel Lemon Other Clinician: Referring Provider: Harrel Lemon Treating Provider/Extender: Melburn Hake, HOYT Weeks in Treatment: 0 Information Obtained from: Patient Chief Complaint Left gluteal fold ulcer Electronic Signature(s) Signed: 11/22/2018 2:51:33 PM By: Worthy Keeler PA-C Entered By: Worthy Keeler on 11/22/2018 14:51:33 [name], [name] ([id number]) -------------------------------------------------------------------------------- HPI Details Patient Name: [name] Date of Service: 11/22/2018 2:15 PM Medical Record Number: [id number] Patient Account Number: [id number] Date of Birth/Sex: 25-Aug-1950 (68 y.o. F) Treating RN: Army Melia Primary Care Provider: Harrel Lemon Other Clinician: Referring Provider: Harrel Lemon Treating Provider/Extender: Melburn Hake, HOYT Weeks in Treatment: 0 History of Present Illness HPI Description: 11/22/2018 patient presents today for initial evaluation in our clinic concerning issues that she has been having with a wound in the left gluteal fold. This has been present since August and was noted initially when she was in the hospital. Fortunately there is no signs of active infection at this time. No fever chills noted. Up to this point DuoDERM has been utilized although I am not sure that is the best treatment option for her based on what I am seeing currently. This actually started as a result of moisture associated skin damage secondary to a C. difficile infection. The patient does have dementia. She is also status post having had a kidney transplant and does have hypertension as well. She does spend a lot of her time  during the day sitting which is probably a part of why this is having trouble healing although it is not really a pressure ulcer that may be contributing to the lack of significant and rapid improvement. She still is having diarrhea as well and is still on treatment for the C. difficile infection she is on Dificid. They have been using barrier cream in the gluteal region to try to help protect the area where the dressing was not. Electronic Signature(s) Signed: 11/22/2018 3:56:58 PM By: Worthy Keeler PA-C Entered By: Worthy Keeler on 11/22/2018 15:56:58 [name], [name] ([id number]) -------------------------------------------------------------------------------- Physical Exam Details Patient Name: [name] Date of Service: 11/22/2018 2:15 PM Medical Record Number: [id number] Patient Account Number: [id number] Date of Birth/Sex: 03-08-1950 (68 y.o. F) Treating RN: Army Melia Primary Care Provider: Harrel Lemon Other Clinician: Referring Provider: Harrel Lemon Treating Provider/Extender: Melburn Hake, HOYT Weeks in Treatment: 0 Constitutional patient is hypertensive.. pulse regular and within target range for patient.Marland Kitchen respirations regular, non-labored and within target range for patient.Marland Kitchen temperature within target range for patient.. Well-nourished and well-hydrated in no acute distress. Eyes conjunctiva clear no eyelid edema noted. pupils equal round and reactive to light and accommodation. Ears, Nose, Mouth, and Throat no gross abnormality of ear auricles or external auditory canals. normal hearing noted during conversation. mucus membranes moist. Respiratory normal breathing without difficulty. clear to auscultation bilaterally. Cardiovascular regular rate and rhythm with normal S1, S2. no clubbing, cyanosis, significant edema, <3 sec cap refill. Gastrointestinal (GI) soft, non-tender, non-distended, +BS. no ventral hernia noted. Musculoskeletal Patient unable to walk  without assistance. no significant deformity or arthritic changes, no loss or range of motion, no clubbing. Psychiatric this patient is able to make decisions and demonstrates good insight into disease process. Alert and Oriented x 3. pleasant and cooperative. Notes Upon inspection today patient's wound bed actually showed signs  of good granulation at this time. There does not appear to be any evidence of active infection which is good news. With that being said the patient is having a little bit of discomfort at the site although I was able to clean it fairly well without any significant pain or complication. Subsequently I do believe that she may do better with a silver alginate dressing to keep the area clean and dry followed by border foam dressing. We will get a send these orders to home health as well. Electronic Signature(s) Signed: 11/22/2018 3:59:28 PM By: Worthy Keeler PA-C Entered By: Worthy Keeler on 11/22/2018 15:59:26 [name], [name] ([id number]) -------------------------------------------------------------------------------- Physician Orders Details Patient Name: [name] Date of Service: 11/22/2018 2:15 PM Medical Record Number: [id number] Patient Account Number: [id number] Date of Birth/Sex: 01-07-1951 (68 y.o. F) Treating RN: Army Melia Primary Care Provider: Harrel Lemon Other Clinician: Referring Provider: Harrel Lemon Treating Provider/Extender: Melburn Hake, HOYT Weeks in Treatment: 0 Verbal / Phone Orders: No Diagnosis Coding ICD-10 Coding Code Description L24.89 Irritant contact dermatitis due to other agents F03.90 Unspecified dementia without behavioral disturbance Z94.0 Kidney transplant status I10 Essential (primary) hypertension Wound Cleansing Wound #1 Left Gluteal fold o Clean wound with Normal Saline. - in office o Cleanse wound with mild soap and water Skin Barriers/Peri-Wound Care Wound #1 Left Gluteal fold o Skin Prep -  periwound o Barrier cream - periwound Primary Wound Dressing Wound #1 Left Gluteal fold o Silver Alginate Secondary Dressing Wound #1 Left Gluteal fold o Boardered Foam Dressing Dressing Change Frequency Wound #1 Left Gluteal fold o Change dressing every other day. Follow-up Appointments Wound #1 Left Gluteal fold o Return Appointment in 1 week. Off-Loading Wound #1 Left Gluteal fold o Turn and reposition every 2 hours o Other: - memory foam cushion while siting [place] #1 Left Gluteal fold [name], [name] ([id number]) o [place] Visits - initiate wound care o Home Health Nurse may visit PRN to address patientos wound care needs. o FACE TO FACE ENCOUNTER: MEDICARE and MEDICAID PATIENTS: I certify that this patient is under my care and that I had a face-to-face encounter that meets the physician face-to-face encounter requirements with this patient on this date. The encounter with the patient was in whole or in part for the following MEDICAL CONDITION: (primary reason for [place]) MEDICAL NECESSITY: I certify, that based on my findings, NURSING services are a medically necessary home health service. HOME BOUND STATUS: I certify that my clinical findings support that this patient is homebound (i.e., Due to illness or injury, pt requires aid of supportive devices such as crutches, cane, wheelchairs, walkers, the use of special transportation or the assistance of another person to leave their place of residence. There is a normal inability to leave the home and doing so requires considerable and taxing effort. Other absences are for medical reasons / religious services and are infrequent or of short duration when for other reasons). o If current dressing causes regression in wound condition, may D/C ordered dressing product/s and apply Normal Saline Moist Dressing daily until next [place] / Other MD appointment. [place] of regression in wound condition at [phone]. o Please direct any NON-WOUND related issues/requests for orders to patient's Primary Care Physician Electronic Signature(s) Signed: 11/22/2018 5:11:53 PM By: Worthy Keeler PA-C Signed: 12/03/2018 8:11:06 AM By: Army Melia Entered By: Army Melia on 11/22/2018 15:12:52 [name], [name] ([id number]) -------------------------------------------------------------------------------- Problem List Details Patient Name: [name] Date  of Service: 11/22/2018 2:15 PM Medical Record Number: [id number] Patient Account Number: [id number] Date of Birth/Sex: 03/27/1950 (68 y.o. F) Treating RN: Army Melia Primary Care Provider: Harrel Lemon Other Clinician: Referring Provider: Harrel Lemon Treating Provider/Extender: Melburn Hake, HOYT Weeks in Treatment: 0 Active Problems ICD-10 Evaluated Encounter Code Description Active Date Today Diagnosis L24.89 Irritant contact dermatitis due to other agents 11/22/2018 No Yes F03.90 Unspecified dementia without behavioral disturbance 11/22/2018 No Yes Z94.0 Kidney transplant status 11/22/2018 No Yes I10 Essential (primary) hypertension 11/22/2018 No Yes Inactive Problems Resolved Problems Electronic Signature(s) Signed: 11/22/2018 2:50:59 PM By: Worthy Keeler PA-C Entered By: Worthy Keeler on 11/22/2018 14:50:58 [place], [name] ([id number]) -------------------------------------------------------------------------------- Progress Note Details Patient Name: [name] Date of Service: 11/22/2018 2:15 PM Medical Record Number: [id number] Patient Account Number: [id number] Date of Birth/Sex: 06-24-1950 (68 y.o. F) Treating RN: Army Melia Primary Care Provider: Harrel Lemon Other Clinician: Referring Provider: Harrel Lemon Treating Provider/Extender: Melburn Hake, HOYT Weeks in Treatment: 0 Subjective Chief Complaint Information obtained from Patient Left gluteal  fold ulcer History of Present Illness (HPI) 11/22/2018 patient presents today for initial evaluation in our clinic concerning issues that she has been having with a wound in the left gluteal fold. This has been present since August and was noted initially when she was in the hospital. Fortunately there is no signs of active infection at this time. No fever chills noted. Up to this point DuoDERM has been utilized although I am not sure that is the best treatment option for her based on what I am seeing currently. This actually started as a result of moisture associated skin damage secondary to a C. difficile infection. The patient does have dementia. She is also status post having had a kidney transplant and does have hypertension as well. She does spend a lot of her time during the day sitting which is probably a part of why this is having trouble healing although it is not really a pressure ulcer that may be contributing to the lack of significant and rapid improvement. She still is having diarrhea as well and is still on treatment for the C. difficile infection she is on Dificid. They have been using barrier cream in the gluteal region to try to help protect the area where the dressing was not. Patient History Unable to Obtain Patient History due to Dementia. Information obtained from Patient. Allergies ACE Inhibitors (Reaction: cough), lisinopril (Reaction: cough) Family History Heart Disease - Siblings, No family history of Cancer, Diabetes, Hypertension, Kidney Disease, Lung Disease, Seizures, Stroke, Thyroid Problems, Tuberculosis. Social History Former smoker - Quit 40+ years ago, Marital Status - Married, Alcohol Use - Never, Drug Use - No History, Caffeine Use - Rarely. Medical History Endocrine Denies history of Type I Diabetes, Type II Diabetes Neurologic Patient has history of Dementia, Neuropathy - hands Oncologic Denies history of Received Chemotherapy, Received  Radiation Hospitalization/Surgery History - [facility] UTI. Medical And Surgical History Notes [name], [name] ([id number]) Genitourinary Kidney Transpant 5+ years ago Review of Systems (ROS) Constitutional Symptoms (General Health) Denies complaints or symptoms of Fatigue, Fever, Chills, Marked Weight Change. Eyes Complains or has symptoms of Glasses / Contacts. Ear/Nose/Mouth/Throat Denies complaints or symptoms of Difficult clearing ears, Sinusitis. Hematologic/Lymphatic Denies complaints or symptoms of Bleeding / Clotting Disorders, Human Immunodeficiency Virus. Respiratory Denies complaints or symptoms of Chronic or frequent coughs, Shortness of Breath. Cardiovascular Complains or has symptoms of LE edema. Gastrointestinal Complains or has symptoms of Frequent diarrhea - C-Diff recently. Denies complaints or symptoms  of Nausea, Vomiting. Endocrine Denies complaints or symptoms of Hepatitis, Thyroid disease, Polydypsia (Excessive Thirst). Immunological Denies complaints or symptoms of Hives, Itching. Integumentary (Skin) Complains or has symptoms of Wounds, Bleeding or bruising tendency. Musculoskeletal Denies complaints or symptoms of Muscle Pain, Muscle Weakness. Oncologic Breast Cancer Psychiatric Denies complaints or symptoms of Anxiety, Claustrophobia. Objective Constitutional patient is hypertensive.. pulse regular and within target range for patient.Marland Kitchen respirations regular, non-labored and within target range for patient.Marland Kitchen temperature within target range for patient.. Well-nourished and well-hydrated in no acute distress. Vitals Time Taken: 2:17 PM, Height: 59 in, Weight: 193 lbs, Source: Measured, BMI: 39, Temperature: 97.6 F, Pulse: 60 bpm, Respiratory Rate: 16 breaths/min, Blood Pressure: 136/61 mmHg. Eyes conjunctiva clear no eyelid edema noted. pupils equal round and reactive to light and accommodation. Ears, Nose, Mouth, and Throat no gross abnormality of ear  auricles or external auditory canals. normal hearing noted during conversation. mucus membranes moist. Respiratory normal breathing without difficulty. clear to auscultation bilaterally. [place], [name] ([id number]) Cardiovascular regular rate and rhythm with normal S1, S2. no clubbing, cyanosis, significant edema, Gastrointestinal (GI) soft, non-tender, non-distended, +BS. no ventral hernia noted. Musculoskeletal Patient unable to walk without assistance. no significant deformity or arthritic changes, no loss or range of motion, no clubbing. Psychiatric this patient is able to make decisions and demonstrates good insight into disease process. Alert and Oriented x 3. pleasant and cooperative. General Notes: Upon inspection today patient's wound bed actually showed signs of good granulation at this time. There does not appear to be any evidence of active infection which is good news. With that being said the patient is having a little bit of discomfort at the site although I was able to clean it fairly well without any significant pain or complication. Subsequently I do believe that she may do better with a silver alginate dressing to keep the area clean and dry followed by border foam dressing. We will get a send these orders to home health as well. Integumentary (Hair, Skin) Wound #1 status is Open. Original cause of wound was Gradually Appeared. The wound is located on the Left Gluteal fold. The wound measures 1.9cm length x 1.5cm width x 0.1cm depth; 2.238cm^2 area and 0.224cm^3 volume. There is Fat Layer (Subcutaneous Tissue) Exposed exposed. There is no tunneling or undermining noted. There is a medium amount of serous drainage noted. The wound margin is flat and intact. There is large (67-100%) red granulation within the wound bed. There is a small (1-33%) amount of necrotic tissue within the wound bed including Adherent Slough. Assessment Active Problems ICD-10 Irritant contact  dermatitis due to other agents Unspecified dementia without behavioral disturbance Kidney transplant status Essential (primary) hypertension Plan Wound Cleansing: Wound #1 Left Gluteal fold: Clean wound with Normal Saline. - in office Cleanse wound with mild soap and water Skin Barriers/Peri-Wound Care: Wound #1 Left Gluteal fold: Skin Prep - periwound Barrier cream - periwound Primary Wound Dressing: Wound #1 Left Gluteal fold: [name], [name] ([id number]) Silver Alginate Secondary Dressing: Wound #1 Left Gluteal fold: Boardered Foam Dressing Dressing Change Frequency: Wound #1 Left Gluteal fold: Change dressing every other day. Follow-up Appointments: Wound #1 Left Gluteal fold: Return Appointment in 1 week. Off-Loading: Wound #1 Left Gluteal fold: Turn and reposition every 2 hours Other: - memory foam cushion while siting Home Health: Wound #1 Left Gluteal fold: Continue Home Health Visits - initiate wound care Home Health Nurse may visit PRN to address patient s wound care needs. FACE TO FACE ENCOUNTER: MEDICARE  and MEDICAID PATIENTS: I certify that this patient is under my care and that I had a face-to-face encounter that meets the physician face-to-face encounter requirements with this patient on this date. The encounter with the patient was in whole or in part for the following MEDICAL CONDITION: (primary reason for [place]) MEDICAL NECESSITY: I certify, that based on my findings, NURSING services are a medically necessary home health service. HOME BOUND STATUS: I certify that my clinical findings support that this patient is homebound (i.e., Due to illness or injury, pt requires aid of supportive devices such as crutches, cane, wheelchairs, walkers, the use of special transportation or the assistance of another person to leave their place of residence. There is a normal inability to leave the home and doing so requires considerable and taxing effort. Other  absences are for medical reasons / religious services and are infrequent or of short duration when for other reasons). If current dressing causes regression in wound condition, may D/C ordered dressing product/s and apply Normal Saline Moist Dressing daily until next [place] / Other MD appointment. [place] of regression in wound condition at [phone]. Please direct any NON-WOUND related issues/requests for orders to patient's Primary Care Physician 1. My suggestion at this time is going to be that we go ahead and initiate treatment with silver alginate to the wound bed followed by border foam dressing. Also recommend Desitin in the gluteal area in general especially folds where the patient is likely to have skin breakdown. 2. I do recommend appropriate offloading the patient should be repositioned at least every 2 hours also recommended a memory foam offloading pillow to help with preventing any pressure which hopefully should prevent any slowing of the healing process. 3. We will send updated orders to home health in order to advise them of what needs to be done as far as initiating the new wound care measures. We will see patient back for reevaluation in 1 week here in the clinic. If anything worsens or changes patient will contact our office for additional recommendations. Electronic Signature(s) Signed: 11/22/2018 4:00:43 PM By: Worthy Keeler PA-C Entered By: Worthy Keeler on 11/22/2018 16:00:43 [place], [name] ([id number]) -------------------------------------------------------------------------------- ROS/PFSH Details Patient Name: [name] Date of Service: 11/22/2018 2:15 PM Medical Record Number: [id number] Patient Account Number: [id number] Date of Birth/Sex: July 26, 1950 (68 y.o. F) Treating RN: Cornell Barman Primary Care Provider: Harrel Lemon Other Clinician: Referring Provider: Harrel Lemon Treating Provider/Extender: Melburn Hake,  HOYT Weeks in Treatment: 0 Unable to Obtain Patient History due to oo Dementia Information Obtained From Patient Constitutional Symptoms (General Health) Complaints and Symptoms: Negative for: Fatigue; Fever; Chills; Marked Weight Change Eyes Complaints and Symptoms: Positive for: Glasses / Contacts Ear/Nose/Mouth/Throat Complaints and Symptoms: Negative for: Difficult clearing ears; Sinusitis Hematologic/Lymphatic Complaints and Symptoms: Negative for: Bleeding / Clotting Disorders; Human Immunodeficiency Virus Respiratory Complaints and Symptoms: Negative for: Chronic or frequent coughs; Shortness of Breath Cardiovascular Complaints and Symptoms: Positive for: LE edema Gastrointestinal Complaints and Symptoms: Positive for: Frequent diarrhea - C-Diff recently Negative for: Nausea; Vomiting Endocrine Complaints and Symptoms: Negative for: Hepatitis; Thyroid disease; Polydypsia (Excessive Thirst) Medical History: Negative for: Type I Diabetes; Type II Diabetes Immunological [name], [name] ([id number]) Complaints and Symptoms: Negative for: Hives; Itching Integumentary (Skin) Complaints and Symptoms: Positive for: Wounds; Bleeding or bruising tendency Musculoskeletal Complaints and Symptoms: Negative for: Muscle Pain; Muscle Weakness Psychiatric Complaints and Symptoms: Negative for: Anxiety; Claustrophobia Genitourinary Medical History: Past Medical History Notes: Kidney Transpant 5+  years ago Neurologic Medical History: Positive for: Dementia; Neuropathy - hands Oncologic Complaints and Symptoms: Review of System Notes: Breast Cancer Medical History: Negative for: Received Chemotherapy; Received Radiation Immunizations Pneumococcal Vaccine: Received Pneumococcal Vaccination: Yes Implantable Devices Yes Hospitalization / Surgery History Type of Hospitalization/Surgery [facility] UTI Family and Social History Cancer: No; Diabetes: No; Heart Disease: Yes -  Siblings; Hypertension: No; Kidney Disease: No; Lung Disease: No; Seizures: No; Stroke: No; Thyroid Problems: No; Tuberculosis: No; Former smoker - Quit 40+ years ago; Marital Status - Married; Alcohol Use: Never; Drug Use: No History; Caffeine Use: Rarely Electronic Signature(s) Signed: 11/22/2018 4:55:32 PM By: Gretta Cool, BSN, RN, CWS, Kim RN, BSN Signed: 11/22/2018 5:11:53 PM By: [place], [place] ([id number]) Entered By: Gretta Cool, BSN, RN, CWS, Kim on 11/22/2018 14:27:18 [name], [name] ([id number]) -------------------------------------------------------------------------------- [place] Details Patient Name: [name] Date of Service: 11/22/2018 Medical Record Number: [id number] Patient Account Number: [id number] Date of Birth/Sex: 1950/10/10 (68 y.o. F) Treating RN: Army Melia Primary Care Provider: Harrel Lemon Other Clinician: Referring Provider: Harrel Lemon Treating Provider/Extender: Melburn Hake, HOYT Weeks in Treatment: 0 Diagnosis Coding ICD-10 Codes Code Description L24.89 Irritant contact dermatitis due to other agents F03.90 Unspecified dementia without behavioral disturbance Z94.0 Kidney transplant status I10 Essential (primary) hypertension Facility Procedures CPT4 Code: [id number] Description: 99214 - WOUND CARE VISIT-LEV 4 EST PT Modifier: Quantity: 1 Physician Procedures CPT4 Code: [id number] Description: WC PHYS LEVEL 3 o NEW PT ICD-10 Diagnosis Description L24.89 Irritant contact dermatitis due to other agents F03.90 Unspecified dementia without behavioral disturbance Z94.0 Kidney transplant status I10 Essential (primary) hypertension Modifier: Quantity: 1 Electronic Signature(s) Signed: 11/22/2018 4:00:57 PM By: Worthy Keeler PA-C Entered By: Worthy Keeler on 11/22/2018 16:00:57

## 2018-12-03 NOTE — Progress Notes (Signed)
[name] ([id number]) Visit Report for 11/22/2018 Allergy List Details Patient Name: [name] Date of Service: 11/22/2018 2:15 PM Medical Record Number: [id number] Patient Account Number: [id number] Date of Birth/Sex: 02-Aug-1950 (68 y.o. F) Treating RN: Cornell Barman Primary Care Tynlee Bayle: Harrel Lemon Other Clinician: Referring Devean Skoczylas: Harrel Lemon Treating Mase Dhondt/Extender: Melburn Hake, HOYT Weeks in Treatment: 0 Allergies Active Allergies ACE Inhibitors Reaction: cough lisinopril Reaction: cough Allergy Notes Electronic Signature(s) Signed: 11/22/2018 4:55:32 PM By: Gretta Cool, BSN, RN, CWS, Kim RN, BSN Entered By: Gretta Cool, BSN, RN, CWS, Kim on 11/22/2018 14:45:16 [name] ([id number]) -------------------------------------------------------------------------------- Arrival Information Details Patient Name: [name] Date of Service: 11/22/2018 2:15 PM Medical Record Number: [id number] Patient Account Number: [id number] Date of Birth/Sex: 02-18-1950 (68 y.o. F) Treating RN: Cornell Barman Primary Care Zayin Valadez: Harrel Lemon Other Clinician: Referring Deontay Ladnier: Harrel Lemon Treating Dartanyan Deasis/Extender: Melburn Hake, HOYT Weeks in Treatment: 0 Visit Information Patient Arrived: Ambulatory Arrival Time: 14:15 Accompanied By: husband Transfer Assistance: None Patient Identification Verified: Yes Secondary Verification Process Completed: Yes Patient Requires Transmission-Based No Precautions: Patient Has Alerts: Yes Patient Alerts: NOT Diabetic Electronic Signature(s) Signed: 11/22/2018 4:55:32 PM By: Gretta Cool, BSN, RN, CWS, Kim RN, BSN Entered By: Gretta Cool, BSN, RN, CWS, Kim on 11/22/2018 14:16:43 [name] ([id number]) -------------------------------------------------------------------------------- Clinic Level of Care Assessment Details Patient Name: [name] Date of Service: 11/22/2018 2:15 PM Medical Record Number: [id number] Patient Account  Number: [id number] Date of Birth/Sex: 1950/02/27 (68 y.o. F) Treating RN: Army Melia Primary Care Maclane Holloran: Harrel Lemon Other Clinician: Referring Jackilyn Umphlett: Harrel Lemon Treating Myrissa Chipley/Extender: Melburn Hake, HOYT Weeks in Treatment: 0 Clinic Level of Care Assessment Items TOOL 2 Quantity Score []  - Use when only an EandM is performed on the INITIAL visit 0 ASSESSMENTS - Nursing Assessment / Reassessment X - General Physical Exam (combine w/ comprehensive assessment (listed just below) when 1 20 performed on new pt. evals) X- 1 25 Comprehensive Assessment (HX, ROS, Risk Assessments, Wounds Hx, etc.) ASSESSMENTS - Wound and Skin Assessment / Reassessment X - Simple Wound Assessment / Reassessment - one wound 1 5 []  - 0 Complex Wound Assessment / Reassessment - multiple wounds []  - 0 Dermatologic / Skin Assessment (not related to wound area) ASSESSMENTS - Ostomy and/or Continence Assessment and Care []  - Incontinence Assessment and Management 0 []  - 0 Ostomy Care Assessment and Management (repouching, etc.) PROCESS - Coordination of Care X - Simple Patient / Family Education for ongoing care 1 15 X- 1 20 Complex (extensive) Patient / Family Education for ongoing care X- 1 10 Staff obtains Programmer, systems, Records, Test Results / Process Orders []  - 0 Staff telephones HHA, Nursing Homes / Clarify orders / etc []  - 0 Routine Transfer to another Facility (non-emergent condition) []  - 0 Routine Hospital Admission (non-emergent condition) X- 1 15 New Admissions / Biomedical engineer / Ordering NPWT, Apligraf, etc. []  - 0 Emergency Hospital Admission (emergent condition) X- 1 10 Simple Discharge Coordination []  - 0 Complex (extensive) Discharge Coordination PROCESS - Special Needs []  - Pediatric / Minor Patient Management 0 []  - 0 Isolation Patient Management [name] ([id number]) []  - 0 Hearing / Language / Visual special needs []  - 0 Assessment of Community  assistance (transportation, D/C planning, etc.) []  - 0 Additional assistance / Altered mentation []  - 0 Support Surface(s) Assessment (bed, cushion, seat, etc.) INTERVENTIONS - Wound Cleansing / Measurement X - Wound Imaging (photographs - any number of wounds) 1 5 []  - 0 Wound Tracing (instead of photographs) X- 1 5 Simple Wound Measurement -  one wound []  - 0 Complex Wound Measurement - multiple wounds X- 1 5 Simple Wound Cleansing - one wound []  - 0 Complex Wound Cleansing - multiple wounds INTERVENTIONS - Wound Dressings []  - Small Wound Dressing one or multiple wounds 0 X- 1 15 Medium Wound Dressing one or multiple wounds []  - 0 Large Wound Dressing one or multiple wounds []  - 0 Application of Medications - injection INTERVENTIONS - Miscellaneous []  - External ear exam 0 []  - 0 Specimen Collection (cultures, biopsies, blood, body fluids, etc.) []  - 0 Specimen(s) / Culture(s) sent or taken to Lab for analysis []  - 0 Patient Transfer (multiple staff / Civil Service fast streamer / Similar devices) []  - 0 Simple Staple / Suture removal (25 or less) []  - 0 Complex Staple / Suture removal (26 or more) []  - 0 Hypo / Hyperglycemic Management (close monitor of Blood Glucose) []  - 0 Ankle / Brachial Index (ABI) - do not check if billed separately Has the patient been seen at the hospital within the last three years: Yes Total Score: 150 Level Of Care: New/Established - Level 4 Electronic Signature(s) Signed: 12/03/2018 8:11:06 AM By: Army Melia Entered By: Army Melia on 11/22/2018 15:04:00 [name] ([id number]) -------------------------------------------------------------------------------- Encounter Discharge Information Details Patient Name: [name] Date of Service: 11/22/2018 2:15 PM Medical Record Number: [id number] Patient Account Number: [id number] Date of Birth/Sex: [birth year] (68 y.o. F) Treating RN: Army Melia Primary Care Justina Bertini: Harrel Lemon Other  Clinician: Referring Saanya Zieske: Harrel Lemon Treating Ewelina Naves/Extender: Melburn Hake, HOYT Weeks in Treatment: 0 Encounter Discharge Information Items Discharge Condition: Stable Ambulatory Status: Ambulatory Discharge Destination: Home Transportation: Private Auto Accompanied By: husband Schedule Follow-up Appointment: Yes Clinical Summary of Care: Electronic Signature(s) Signed: 12/03/2018 8:11:06 AM By: Army Melia Entered By: Army Melia on 11/22/2018 15:04:49 [place] ([id number]) -------------------------------------------------------------------------------- Lower Extremity Assessment Details Patient Name: [name] Date of Service: 11/22/2018 2:15 PM Medical Record Number: [id number] Patient Account Number: [id number] Date of Birth/Sex: Oct 31, 1950 (68 y.o. F) Treating RN: Cornell Barman Primary Care Vayla Wilhelmi: Harrel Lemon Other Clinician: Referring Forestine Macho: Harrel Lemon Treating Rojean Ige/Extender: Melburn Hake, HOYT Weeks in Treatment: 0 Electronic Signature(s) Signed: 11/22/2018 4:55:32 PM By: Gretta Cool, BSN, RN, CWS, Kim RN, BSN Entered By: Gretta Cool, BSN, RN, CWS, Kim on 11/22/2018 14:30:26 ARVELLA, [place] ([id number]) -------------------------------------------------------------------------------- Multi Wound Chart Details Patient Name: [name] Date of Service: 11/22/2018 2:15 PM Medical Record Number: [id number] Patient Account Number: [id number] Date of Birth/Sex: [birth year] (68 y.o. 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) Secondary Etiology: Pressure Ulcer N/A N/A Comorbid History: Dementia, Neuropathy N/A N/A Date Acquired: 10/16/2018 N/A N/A Weeks of Treatment: 0 N/A N/A Wound Status: Open N/A N/A Measurements L x W x D 1.9x1.5x0.1 N/A N/A (cm) Area (cm) : 2.238 N/A N/A Volume (cm) : 0.224 N/A N/A % Reduction in Area: 0.00% N/A N/A % Reduction in Volume: 0.00% N/A N/A Classification: Full Thickness Without N/A N/A Exposed Support Structures Exudate Amount: Medium N/A N/A Exudate Type: Serous N/A N/A Exudate Color: amber N/A N/A Wound Margin: Flat and Intact N/A N/A Granulation Amount: Large (67-100%) N/A N/A Granulation Quality: Red N/A N/A Necrotic Amount: Small (1-33%)  N/A N/A Exposed Structures: Fat Layer (Subcutaneous N/A N/A Tissue) Exposed: Yes Fascia: No Tendon: No Muscle: No HEND, MCCARRELL ([id number]) Joint: No Bone: No Epithelialization: None N/A N/A Treatment Notes Electronic Signature(s) Signed: 12/03/2018 8:11:06 AM By: Army Melia Entered By: Army Melia on 11/22/2018 14:59:16 [name] ([id number]) -------------------------------------------------------------------------------- Multi-Disciplinary Care Plan Details Patient Name: [name] Date of Service: 11/22/2018 2:15 PM Medical Record Number: [id number] Patient Account Number: [id number] Date of Birth/Sex: 1950/03/24 (68 y.o. F) Treating RN: Army Melia Primary Care Veryl Abril: Harrel Lemon Other Clinician: Referring Onnie Alatorre: Harrel Lemon Treating Jameika Kinn/Extender: Melburn Hake, HOYT Weeks in Treatment: 0 Active Inactive Abuse / Safety / Falls / Self Care Management Nursing Diagnoses: Potential for falls Goals: Patient/caregiver will demonstrate safe use of adaptive devices to increase mobility Date Initiated: 11/22/2018 Target Resolution Date: 12/21/2018 Goal Status: Active Interventions: Assess: immobility, friction, shearing,  incontinence upon admission and as needed Notes: Orientation to the Wound Care Program Nursing Diagnoses: Knowledge deficit related to the wound healing center program Goals: Patient/caregiver will verbalize understanding of the [place] Program Date Initiated: 11/22/2018 Target Resolution Date: 12/21/2018 Goal Status: Active Interventions: Provide education on orientation to the wound center Notes: Pressure Nursing Diagnoses: Knowledge deficit related to management of pressures ulcers Goals: Patient/caregiver will verbalize risk factors for pressure ulcer development Date Initiated: 11/22/2018 Target Resolution Date: 12/21/2018 Goal Status: Active Interventions: Assess: immobility, friction, shearing, incontinence upon admission and as needed ROSSELYN, MARTHA ([id number]) Notes: Wound/Skin Impairment Nursing Diagnoses: Impaired tissue integrity Goals: Ulcer/skin breakdown will have a volume reduction of 30% by week 4 Date Initiated: 11/22/2018 Target Resolution Date: 12/21/2018 Goal Status: Active Interventions: Assess ulceration(s) every visit Notes: Electronic Signature(s) Signed: 12/03/2018 8:11:06 AM By: Army Melia Entered By: Army Melia on 11/22/2018 14:59:02 Balfour, [name] ([id number]) -------------------------------------------------------------------------------- Pain Assessment Details Patient Name: [name] Date of Service: 11/22/2018 2:15 PM Medical Record Number: [id number] Patient Account Number: [id number] Date of Birth/Sex: 11/07/1950 (68 y.o. F) Treating RN: Cornell Barman Primary Care Kimora Stankovic: Harrel Lemon Other Clinician: Referring Arend Bahl: Harrel Lemon Treating Denia Mcvicar/Extender: Melburn Hake, HOYT Weeks in Treatment: 0 Active Problems Location of Pain Severity and Description of Pain Patient Has Paino No Site Locations Pain Management and Medication Current Pain Management: Notes Patient denies pain at this time Electronic  Signature(s) Signed: 11/22/2018 4:55:32 PM By: Gretta Cool, BSN, RN, CWS, Kim RN, BSN Entered By: Gretta Cool, BSN, RN, CWS, Kim on 11/22/2018 14:17:06 [name] ([id number]) -------------------------------------------------------------------------------- Patient/Caregiver Education Details Patient Name: [name] Date of Service: 11/22/2018 2:15 PM Medical Record Number: [id number] Patient Account Number: [id number] Date of Birth/Gender: 1951-02-03 (68 y.o. F) Treating RN: Army Melia Primary Care Physician: Harrel Lemon Other Clinician: Referring Physician: Harrel Lemon Treating Physician/Extender: Sharalyn Ink in Treatment: 0 Education Assessment Education Provided To: Patient Education Topics Provided Wound/Skin Impairment: Handouts: Caring for Your Ulcer Methods: Demonstration, Explain/Verbal Responses: State content correctly Electronic Signature(s) Signed: 12/03/2018 8:11:06 AM By: Army Melia Entered By: Army Melia on 11/22/2018 15:04:13 [place] ([id number]) -------------------------------------------------------------------------------- Wound Assessment Details Patient Name: [name] Date of Service: 11/22/2018 2:15 PM Medical Record Number: [id number] Patient Account Number: [id number] Date of Birth/Sex: September 14, 1950 (68 y.o. F) Treating RN: Cornell Barman Primary Care Drey Shaff: Harrel Lemon Other Clinician: Referring Anjoli Diemer: Harrel Lemon Treating Drelyn Pistilli/Extender: Melburn Hake, HOYT Weeks in Treatment: 0 Wound Status Wound Number: 1 Primary Etiology: Incontinence Associated Dermatitis (IAD) Wound Location: Left Gluteal fold Secondary Etiology: Pressure Ulcer Wounding Event: Gradually Appeared Wound Status: Open Date Acquired: 10/16/2018 Comorbid History: Dementia, Neuropathy Weeks Of Treatment:  0 Clustered Wound: No Photos Wound Measurements Length: (cm) 1.9 Width: (cm) 1.5 Depth: (cm) 0.1 Area: (cm) 2.238 Volume: (cm)  0.224 % Reduction in Area: 0% % Reduction in Volume: 0% Epithelialization: None Tunneling: No Undermining: No Wound Description Full Thickness Without Exposed Support Foul Odo Classification: Structures Slough/F Wound Margin: Flat and Intact Exudate Medium Amount: Exudate Type: Serous Exudate Color: amber r After Cleansing: No ibrino Yes Wound Bed Granulation Amount: Large (67-100%) Exposed Structure Granulation Quality: Red Fascia Exposed: No Necrotic Amount: Small (1-33%) Fat Layer (Subcutaneous Tissue) Exposed: Yes Necrotic Quality: Adherent Slough Tendon Exposed: No Muscle Exposed: No Joint Exposed: No Bone Exposed: No TAIYANA, KISSLER ([id number]) Electronic Signature(s) Signed: 11/22/2018 4:55:32 PM By: Gretta Cool, BSN, RN, CWS, Kim RN, BSN Entered By: Gretta Cool, BSN, RN, CWS, Kim on 11/22/2018 14:43:27 [name] ([id number]) -------------------------------------------------------------------------------- [facility] Details Patient Name: [name] Date of Service: 11/22/2018 2:15 PM Medical Record Number: [id number] Patient Account Number: [id number] Date of Birth/Sex: 01/05/1951 (68 y.o. F) Treating RN: Cornell Barman Primary Care Jadian Karman: Harrel Lemon Other Clinician: Referring Hakan Nudelman: Harrel Lemon Treating Alohilani Levenhagen/Extender: Melburn Hake, HOYT Weeks in Treatment: 0 Vital Signs Time Taken: 14:17 Temperature (F): 97.6 Height (in): 59 Pulse (bpm): 60 Weight (lbs): 193 Respiratory Rate (breaths/min): 16 Source: Measured Blood Pressure (mmHg): 136/61 Body Mass Index (BMI): 39 Reference Range: 80 - 120 mg / dl Electronic Signature(s) Signed: 11/22/2018 4:55:32 PM By: Gretta Cool, BSN, RN, CWS, Kim RN, BSN Entered By: Gretta Cool, BSN, RN, CWS, Kim on 11/22/2018 14:17:42

## 2018-12-05 LAB — CULTURE, BLOOD (ROUTINE X 2)
Culture: NO GROWTH
Culture: NO GROWTH
Special Requests: ADEQUATE
Special Requests: ADEQUATE

## 2018-12-06 ENCOUNTER — Encounter: Payer: Medicare Other | Admitting: Physician Assistant

## 2018-12-06 ENCOUNTER — Other Ambulatory Visit: Payer: Self-pay

## 2018-12-06 DIAGNOSIS — I10 Essential (primary) hypertension: Secondary | ICD-10-CM | POA: Diagnosis not present

## 2018-12-06 DIAGNOSIS — L2489 Irritant contact dermatitis due to other agents: Secondary | ICD-10-CM | POA: Diagnosis not present

## 2018-12-06 DIAGNOSIS — L98419 Non-pressure chronic ulcer of buttock with unspecified severity: Secondary | ICD-10-CM | POA: Diagnosis present

## 2018-12-06 DIAGNOSIS — Z940 Kidney transplant status: Secondary | ICD-10-CM | POA: Diagnosis not present

## 2018-12-06 DIAGNOSIS — G629 Polyneuropathy, unspecified: Secondary | ICD-10-CM | POA: Diagnosis not present

## 2018-12-06 DIAGNOSIS — Z87891 Personal history of nicotine dependence: Secondary | ICD-10-CM | POA: Diagnosis not present

## 2018-12-06 DIAGNOSIS — F0390 Unspecified dementia without behavioral disturbance: Secondary | ICD-10-CM | POA: Diagnosis not present

## 2018-12-06 DIAGNOSIS — A0472 Enterocolitis due to Clostridium difficile, not specified as recurrent: Secondary | ICD-10-CM | POA: Diagnosis not present

## 2018-12-06 NOTE — Progress Notes (Signed)
Sheri, Navarro ([id number]) Visit Report for 12/06/2018 Arrival Information Details Patient Name: [name], [name] Date of Service: 12/06/2018 1:45 PM Medical Record Number: [id number] Patient Account Number: [id number] Date of Birth/Sex: Sep 18, 1950 (68 y.o. F) Treating RN: Army Melia Primary Care Kilani Joffe: Harrel Lemon Other Clinician: Referring Jacaden Forbush: Harrel Lemon Treating Kynleigh Artz/Extender: Melburn Hake, HOYT Weeks in Treatment: 2 Visit Information History Since Last Visit Added or deleted any medications: Yes Patient Arrived: Ambulatory Any new allergies or adverse reactions: No Arrival Time: 13:50 Had a fall or experienced change in No Accompanied By: daughter activities of daily living that may affect Transfer Assistance: None risk of falls: Patient Identification Verified: Yes Signs or symptoms of abuse/neglect since last visito No Secondary Verification Process Completed: Yes Hospitalized since last visit: No Patient Requires Transmission-Based No Implantable device outside of the clinic excluding No Precautions: cellular tissue based products placed in the center Patient Has Alerts: Yes since last visit: Patient Alerts: NOT Has Dressing in Place as Prescribed: Yes Diabetic Pain Present Now: No Electronic Signature(s) Signed: 12/06/2018 4:02:16 PM By: Lorine Bears RCP, RRT, CHT Entered By: Lorine Bears on 12/06/2018 13:51:54 [name] ([id number]) -------------------------------------------------------------------------------- Clinic Level of Care Assessment Details Patient Name: [name] Date of Service: 12/06/2018 1:45 PM Medical Record Number: [id number] Patient Account Number: [id number] Date of Birth/Sex: September 01, 1950 (68 y.o. F) Treating RN: Army Melia Primary Care Emelina Hinch: Harrel Lemon Other Clinician: Referring Zoanne Newill: Harrel Lemon Treating Baldomero Mirarchi/Extender: Melburn Hake, HOYT Weeks in Treatment:  2 Clinic Level of Care Assessment Items TOOL 4 Quantity Score []  - Use when only an EandM is performed on FOLLOW-UP visit 0 ASSESSMENTS - Nursing Assessment / Reassessment X - Reassessment of Co-morbidities (includes updates in patient status) 1 10 X- 1 5 Reassessment of Adherence to Treatment Plan ASSESSMENTS - Wound and Skin Assessment / Reassessment []  - Simple Wound Assessment / Reassessment - one wound 0 X- 3 5 Complex Wound Assessment / Reassessment - multiple wounds []  - 0 Dermatologic / Skin Assessment (not related to wound area) ASSESSMENTS - Focused Assessment []  - Circumferential Edema Measurements - multi extremities 0 []  - 0 Nutritional Assessment / Counseling / Intervention []  - 0 Lower Extremity Assessment (monofilament, tuning fork, pulses) []  - 0 Peripheral Arterial Disease Assessment (using hand held doppler) ASSESSMENTS - Ostomy and/or Continence Assessment and Care []  - Incontinence Assessment and Management 0 []  - 0 Ostomy Care Assessment and Management (repouching, etc.) PROCESS - Coordination of Care X - Simple Patient / Family Education for ongoing care 1 15 []  - 0 Complex (extensive) Patient / Family Education for ongoing care []  - 0 Staff obtains Programmer, systems, Records, Test Results / Process Orders []  - 0 Staff telephones HHA, Nursing Homes / Clarify orders / etc []  - 0 Routine Transfer to another Facility (non-emergent condition) []  - 0 Routine Hospital Admission (non-emergent condition) []  - 0 New Admissions / Biomedical engineer / Ordering NPWT, Apligraf, etc. []  - 0 Emergency Hospital Admission (emergent condition) X- 1 10 Simple Discharge Coordination [place], Carmyn ([id number]) []  - 0 Complex (extensive) Discharge Coordination PROCESS - Special Needs []  - Pediatric / Minor Patient Management 0 []  - 0 Isolation Patient Management []  - 0 Hearing / Language / Visual special needs []  - 0 Assessment of Community assistance  (transportation, D/C planning, etc.) []  - 0 Additional assistance / Altered mentation []  - 0 Support Surface(s) Assessment (bed, cushion, seat, etc.) INTERVENTIONS - Wound Cleansing / Measurement []  - Simple Wound Cleansing - one wound 0 X- 3 5  Complex Wound Cleansing - multiple wounds X- 1 5 Wound Imaging (photographs - any number of wounds) []  - 0 Wound Tracing (instead of photographs) []  - 0 Simple Wound Measurement - one wound X- 3 5 Complex Wound Measurement - multiple wounds INTERVENTIONS - Wound Dressings []  - Small Wound Dressing one or multiple wounds 0 X- 3 15 Medium Wound Dressing one or multiple wounds []  - 0 Large Wound Dressing one or multiple wounds []  - 0 Application of Medications - topical []  - 0 Application of Medications - injection INTERVENTIONS - Miscellaneous []  - External ear exam 0 []  - 0 Specimen Collection (cultures, biopsies, blood, body fluids, etc.) []  - 0 Specimen(s) / Culture(s) sent or taken to Lab for analysis []  - 0 Patient Transfer (multiple staff / Civil Service fast streamer / Similar devices) []  - 0 Simple Staple / Suture removal (25 or less) []  - 0 Complex Staple / Suture removal (26 or more) []  - 0 Hypo / Hyperglycemic Management (close monitor of Blood Glucose) []  - 0 Ankle / Brachial Index (ABI) - do not check if billed separately X- 1 5 Vital Signs Lefebre, Lari ([id number]) Has the patient been seen at the hospital within the last three years: Yes Total Score: 140 Level Of Care: New/Established - Level 4 Electronic Signature(s) Signed: 12/06/2018 4:16:03 PM By: Army Melia Entered By: Army Melia on 12/06/2018 14:41:21 [place] ([id number]) -------------------------------------------------------------------------------- Encounter Discharge Information Details Patient Name: [name] Date of Service: 12/06/2018 1:45 PM Medical Record Number: [id number] Patient Account Number: [id number] Date of Birth/Sex: January 27, 1951  (68 y.o. F) Treating RN: Army Melia Primary Care Vernell Back: Harrel Lemon Other Clinician: Referring Pier Laux: Harrel Lemon Treating Craig Ionescu/Extender: Melburn Hake, HOYT Weeks in Treatment: 2 Encounter Discharge Information Items Discharge Condition: Stable Ambulatory Status: Ambulatory Discharge Destination: Home Transportation: Private Auto Accompanied By: family Schedule Follow-up Appointment: Yes Clinical Summary of Care: Electronic Signature(s) Signed: 12/06/2018 4:16:03 PM By: Army Melia Entered By: Army Melia on 12/06/2018 14:42:15 [place] ([id number]) -------------------------------------------------------------------------------- Lower Extremity Assessment Details Patient Name: [name] Date of Service: 12/06/2018 1:45 PM Medical Record Number: [id number] Patient Account Number: [id number] Date of Birth/Sex: Mar 14, 1950 (68 y.o. F) Treating RN: Harold Barban Primary Care Quame Spratlin: Harrel Lemon Other Clinician: Referring Marizol Borror: Harrel Lemon Treating Mackie Goon/Extender: Melburn Hake, HOYT Weeks in Treatment: 2 Notes Wounds gluteal folds. Electronic Signature(s) Signed: 12/06/2018 4:05:35 PM By: Harold Barban Entered By: Harold Barban on 12/06/2018 13:54:21 [place] ([id number]) -------------------------------------------------------------------------------- Multi Wound Chart Details Patient Name: [name] Date of Service: 12/06/2018 1:45 PM Medical Record Number: [id number] Patient Account Number: [id number] Date of Birth/Sex: Apr 08, 1950 (68 y.o. 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) Dermatitis (IAD) Dermatitis (IAD) Secondary Etiology: Pressure Ulcer N/A N/A Comorbid History: Dementia, Neuropathy Dementia, Neuropathy N/A Date Acquired: 10/16/2018 11/29/2018 11/29/2018 Weeks of Treatment: 2 1 1  Wound Status: Open Open Open Measurements L x W x D 1.2x0.9x0.1 0.5x0.7x0.1 1x0.4x0.1 (cm) Area (cm) : 0.848 0.275 0.314 Volume (cm) : 0.085 0.027 0.031 % Reduction in Area: 62.10% 67.60% 33.30% % Reduction in Volume: 62.10% 68.20% 34.00% Classification: Full Thickness Without Full  Thickness Without Full Thickness Without Exposed Support Structures Exposed Support Structures Exposed Support Structures Exudate Amount: Medium Medium N/A Exudate Type: Serous Serous N/A Exudate Color: amber amber N/A Wound Margin: Flat and Intact Flat and Intact N/A Granulation Amount: Large (67-100%) Large (67-100%) N/A Granulation Quality: Red Pink N/A Necrotic Amount: Small (1-33%) Small (1-33%) N/A Exposed Structures: Fat Layer (Subcutaneous Fat Layer (Subcutaneous N/A Tissue) Exposed: Yes Tissue) Exposed: Yes Fascia: No Fascia: No Tendon: No Tendon: No Muscle: No Muscle: No MARRIN, Chrishawna ([id number]) Joint: No Joint: No Bone: No Bone: No Epithelialization: None None N/A Treatment Notes Electronic Signature(s) Signed: 12/06/2018 4:16:03 PM By: Army Melia Entered By: Army Melia on 12/06/2018 14:36:34 [name] ([id number]) -------------------------------------------------------------------------------- Multi-Disciplinary Care Plan Details Patient Name: [name] Date of Service: 12/06/2018 1:45 PM Medical Record Number: [id number] Patient Account Number: [id number] Date of Birth/Sex: 05-12-1950 (68  y.o. F) Treating RN: Army Melia Primary Care Ande Therrell: Harrel Lemon Other Clinician: Referring Majesti Gambrell: Harrel Lemon Treating Martese Vanatta/Extender: Melburn Hake, HOYT Weeks in Treatment: 2 Active Inactive Abuse / Safety / Falls / Self Care Management Nursing Diagnoses: Potential for falls Goals: Patient/caregiver will demonstrate safe use of adaptive devices to increase mobility Date Initiated: 11/22/2018 Target Resolution Date: 12/21/2018 Goal Status: Active Interventions: Assess: immobility, friction, shearing, incontinence upon admission and as needed Notes: Orientation to the Wound Care Program Nursing Diagnoses: Knowledge deficit related to the wound healing center program Goals: Patient/caregiver will verbalize understanding of the [facility] Program Date Initiated: 11/22/2018 Target Resolution Date: 12/21/2018 Goal Status: Active Interventions: Provide education on orientation to the wound center Notes: Pressure Nursing Diagnoses: Knowledge deficit related to management of pressures ulcers Goals: Patient/caregiver will verbalize risk factors for pressure ulcer development Date Initiated: 11/22/2018 Target Resolution Date: 12/21/2018 Goal Status: Active Interventions: Assess: immobility, friction, shearing, incontinence upon admission and as needed ANIKA, SHORE ([id number]) Notes: Wound/Skin Impairment Nursing Diagnoses: Impaired tissue integrity Goals: Ulcer/skin breakdown will have a volume reduction of 30% by week 4 Date Initiated: 11/22/2018 Target Resolution Date: 12/21/2018 Goal Status: Active Interventions: Assess ulceration(s) every visit Notes: Electronic Signature(s) Signed: 12/06/2018 4:16:03 PM By: Army Melia Entered By: Army Melia on 12/06/2018 14:36:26 [place], [name] ([id number]) -------------------------------------------------------------------------------- Pain Assessment Details Patient Name: [name] Date of  Service: 12/06/2018 1:45 PM Medical Record Number: [id number] Patient Account Number: [id number] Date of Birth/Sex: 30-May-1950 (68 y.o. F) Treating RN: Army Melia Primary Care Makeba Delcastillo: Harrel Lemon Other Clinician: Referring Kaavya Puskarich: Harrel Lemon Treating Andras Grunewald/Extender: Melburn Hake, HOYT Weeks in Treatment: 2 Active Problems Location of Pain Severity and Description of Pain Patient Has Paino No Site Locations Pain Management and Medication Current Pain Management: Electronic Signature(s) Signed: 12/06/2018 4:02:16 PM By: Paulla Fore, RRT, CHT Signed: 12/06/2018 4:16:03 PM By: Army Melia Entered By: Lorine Bears on 12/06/2018 13:52:02 [place] ([id number]) -------------------------------------------------------------------------------- Patient/Caregiver Education Details Patient Name: [name] Date of Service: 12/06/2018 1:45 PM Medical Record Number: [id number] Patient Account Number: [id number] Date of Birth/Gender: [birth year] (68 y.o. F) Treating RN: Army Melia Primary Care Physician: Harrel Lemon Other Clinician: Referring Physician: Harrel Lemon Treating Physician/Extender: Sharalyn Ink in Treatment: 2 Education Assessment Education Provided To: Patient Education Topics Provided Wound/Skin Impairment: Handouts: Caring for Your Ulcer Methods: Demonstration, Explain/Verbal Responses: State content correctly Electronic Signature(s) Signed: 12/06/2018 4:16:03 PM By: Army Melia Entered By: Army Melia on 12/06/2018 14:41:34 [place], [name] ([id number]) -------------------------------------------------------------------------------- Wound Assessment Details Patient Name: [name] Date of Service: 12/06/2018 1:45 PM Medical Record Number: [id number] Patient Account Number: [id number] Date  of Birth/Sex: 27-Nov-1950 (68 y.o. F) Treating RN: Harold Barban Primary Care Javarus Dorner: Harrel Lemon Other Clinician: Referring Monserrate Blaschke: Harrel Lemon Treating Gracia Saggese/Extender: Melburn Hake, HOYT Weeks in Treatment: 2 Wound Status Wound Number: 1 Primary Etiology: Incontinence Associated Dermatitis (IAD) Wound Location: Left Gluteal fold Secondary Etiology: Pressure Ulcer Wounding Event: Gradually Appeared Wound Status: Open Date Acquired: 10/16/2018 Comorbid History: Dementia, Neuropathy Weeks Of Treatment: 2 Clustered Wound: No Photos Wound Measurements Length: (cm) 1.2 Width: (cm) 0.9 Depth: (cm) 0.1 Area: (cm) 0.848 Volume: (cm) 0.085 % Reduction in Area: 62.1% % Reduction in Volume: 62.1% Epithelialization: None Tunneling: No Undermining: No Wound Description Full Thickness Without Exposed Support Classification: Structures Wound Margin: Flat and Intact Exudate Medium Amount: Exudate Type: Serous Exudate Color: amber Foul Odor After Cleansing: No Slough/Fibrino Yes Wound Bed Granulation Amount: Large (67-100%) Exposed Structure Granulation Quality: Red Fascia Exposed: No Necrotic Amount: Small (1-33%) Fat Layer (Subcutaneous Tissue) Exposed: Yes Necrotic Quality: Adherent Slough Tendon Exposed: No Muscle Exposed: No Joint Exposed: No Bone Exposed: No Jawad, Carlyon ([id number]) Treatment Notes Wound #1 (Left Gluteal fold) Notes silver cell BFD Electronic Signature(s) Signed: 12/06/2018 4:05:35 PM By: Harold Barban Entered By: Harold Barban on 12/06/2018 14:04:57 [name] ([id number]) -------------------------------------------------------------------------------- Wound Assessment Details Patient Name: [name] Date of Service: 12/06/2018 1:45 PM Medical Record Number: [id number] Patient Account Number: [id number] Date of Birth/Sex: 09-09-1950 (68 y.o. F) Treating RN: Harold Barban Primary Care Kaeya Schiffer: Harrel Lemon Other Clinician: Referring Viva Gallaher: Harrel Lemon Treating Cleaster Shiffer/Extender: Melburn Hake,  HOYT Weeks in Treatment: 2 Wound Status Wound Number: 2 Primary Etiology: Incontinence Associated Dermatitis (IAD) Wound Location: Right Gluteal fold Wound Status: Open Wounding Event: Gradually Appeared Comorbid History: Dementia, Neuropathy Date Acquired: 11/29/2018 Weeks Of Treatment: 1 Clustered Wound: No Photos Wound Measurements Length: (cm) 0.5 Width: (cm) 0.7 Depth: (cm) 0.1 Area: (cm) 0.275 Volume: (cm) 0.027 % Reduction in Area: 67.6% % Reduction in Volume: 68.2% Epithelialization: None Tunneling: No Undermining: No Wound Description Full Thickness Without Exposed Support Classification: Structures Wound Margin: Flat and Intact Exudate Medium Amount: Exudate Type: Serous Exudate Color: amber Foul Odor After Cleansing: No Slough/Fibrino Yes Wound Bed Granulation Amount: Large (67-100%) Exposed Structure Granulation Quality: Pink Fascia Exposed: No Necrotic Amount: Small (1-33%) Fat Layer (Subcutaneous Tissue) Exposed: Yes Necrotic Quality: Adherent Slough Tendon Exposed: No Muscle Exposed: No Joint Exposed: No Bone Exposed: No Mohs, Deliliah ([id number]) Treatment Notes Wound #2 (Right Gluteal fold) Notes silver cell BFD Electronic Signature(s) Signed: 12/06/2018 4:05:35 PM By: Harold Barban Entered By: Harold Barban on 12/06/2018 14:05:19 [name] ([id number]) -------------------------------------------------------------------------------- Wound Assessment Details Patient Name: [name] Date of Service: 12/06/2018 1:45 PM Medical Record Number: [id number] Patient Account Number: [id number] Date of Birth/Sex: 05-08-1950 (68 y.o. F) Treating RN: Harold Barban Primary Care Sonika Levins: Harrel Lemon Other Clinician: Referring Everline Mahaffy: Harrel Lemon Treating Loann Chahal/Extender: Melburn Hake, HOYT Weeks in Treatment: 2 Wound Status Wound Number: 3 Primary Etiology: Incontinence Associated Dermatitis (IAD) Wound Location:  Left, Medial Upper Leg Wound Status: Open Wounding Event: Gradually Appeared Date Acquired: 11/29/2018 Weeks Of Treatment: 1 Clustered Wound: No Wound Measurements Length: (cm) 1 Width: (cm) 0.4 Depth: (cm) 0.1 Area: (cm) 0.314 Volume: (cm) 0.031 % Reduction in Area: 33.3% % Reduction in Volume: 34% Wound Description Full Thickness Without Exposed Support Classification: Structures Treatment Notes Wound #3 (Left, Medial Upper Leg) Notes silver cell BFD Electronic Signature(s) Signed: 12/06/2018 4:05:35 PM By: Harold Barban Entered By: Harold Barban on 12/06/2018 14:04:23 [name] ([id number]) -------------------------------------------------------------------------------- [place] Details Patient Name: [name] Date  of Service: 12/06/2018 1:45 PM Medical Record Number: [id number] Patient Account Number: [id number] Date of Birth/Sex: 1950-07-30 (68 y.o. F) Treating RN: Army Melia Primary Care Joury Allcorn: Harrel Lemon Other Clinician: Referring Mozella Rexrode: Harrel Lemon Treating Finneas Mathe/Extender: Melburn Hake, HOYT Weeks in Treatment: 2 Vital Signs Time Taken: 13:52 Temperature (F): 97.7 Height (in): 59 Pulse (bpm): 61 Weight (lbs): 193 Respiratory Rate (breaths/min): 16 Body Mass Index (BMI): 39 Blood Pressure (mmHg): 161/82 Reference Range: 80 - 120 mg / dl Electronic Signature(s) Signed: 12/06/2018 4:02:16 PM By: Lorine Bears RCP, RRT, CHT Entered By: Lorine Bears on 12/06/2018 13:52:29

## 2018-12-06 NOTE — Progress Notes (Addendum)
SHADE, RIVENBARK ([id number]) Visit Report for 12/06/2018 Chief Complaint Document Details Patient Name: [name], [name] Date of Service: 12/06/2018 1:45 PM Medical Record Number: [id number] Patient Account Number: [id number] Date of Birth/Sex: 06-Sep-1950 (68 y.o. F) Treating RN: Army Melia Primary Care Provider: Harrel Lemon Other Clinician: Referring Provider: Harrel Lemon Treating Provider/Extender: Melburn Hake, Brallan Denio Weeks in Treatment: 2 Information Obtained from: Patient Chief Complaint Left gluteal fold ulcer Electronic Signature(s) Signed: 12/06/2018 2:35:09 PM By: Worthy Keeler PA-C Entered By: Worthy Keeler on 12/06/2018 14:35:09 TRIXIE, MACLAREN ([id number]) -------------------------------------------------------------------------------- HPI Details Patient Name: [name] Date of Service: 12/06/2018 1:45 PM Medical Record Number: [id number] Patient Account Number: [id number] Date of Birth/Sex: December 24, 1950 (68 y.o. F) Treating RN: Army Melia Primary Care Provider: Harrel Lemon Other Clinician: Referring Provider: Harrel Lemon Treating Provider/Extender: Melburn Hake, Prisca Gearing Weeks in Treatment: 2 History of Present Illness HPI Description: 11/22/2018 patient presents today for initial evaluation in our clinic concerning issues that she has been having with a wound in the left gluteal fold. This has been present since August and was noted initially when she was in the hospital. Fortunately there is no signs of active infection at this time. No fever chills noted. Up to this point DuoDERM has been utilized although I am not sure that is the best treatment option for her based on what I am seeing currently. This actually started as a result of moisture associated skin damage secondary to a C. difficile infection. The patient does have dementia. She is also status post having had a kidney transplant and does have hypertension as well. She does spend a lot of her  time during the day sitting which is probably a part of why this is having trouble healing although it is not really a pressure ulcer that may be contributing to the lack of significant and rapid improvement. She still is having diarrhea as well and is still on treatment for the C. difficile infection she is on Dificid. They have been using barrier cream in the gluteal region to try to help protect the area where the dressing was not. 11/29/2018 upon evaluation today patient does not appear to be doing significantly better at this time. She still had some somewhat softer stools but not really as much diarrhea as what she had when the C. difficile infection was not full cycle. Overall things seem to be doing better in that regard although she does have 2 new areas on evaluation today. There is no signs of active infection. No fevers, chills, nausea, vomiting, or diarrhea. 12/06/2018 on evaluation today patient actually appears to be doing better with regard to the wounds in the gluteal region in general. Overall the biggest issue has been that apparently one of the wounds was missed by the home health nurse and not noted to be open and its been getting stuck to her brief. Nonetheless other than that and even with that considered she still seems to actually be doing quite well as far as I am seeing today. Electronic Signature(s) Signed: 12/06/2018 2:41:25 PM By: Worthy Keeler PA-C Entered By: Worthy Keeler on 12/06/2018 14:41:25 ZONDRA, LAWLOR ([id number]) -------------------------------------------------------------------------------- Physical Exam Details Patient Name: [name] Date of Service: 12/06/2018 1:45 PM Medical Record Number: [id number] Patient Account Number: [id number] Date of Birth/Sex: [birth year] (68 y.o. F) Treating RN: Army Melia Primary Care Provider: Harrel Lemon Other Clinician: Referring Provider: Harrel Lemon Treating Provider/Extender: Melburn Hake,  Ellyana Crigler Weeks in Treatment: 2 Constitutional Well-nourished and well-hydrated in no acute distress.  Respiratory normal breathing without difficulty. clear to auscultation bilaterally. Cardiovascular regular rate and rhythm with normal S1, S2. Psychiatric this patient is able to make decisions and demonstrates good insight into disease process. Alert and Oriented x 3. pleasant and cooperative. Notes Patient's wound bed currently showed signs of good granulation epithelization there is no evidence of active infection and overall the patient seems to be making good progress at this point which is excellent news. I am pleased with the way the wound seems to be healing at all 3 locations to be honest. Electronic Signature(s) Signed: 12/06/2018 2:41:56 PM By: Worthy Keeler PA-C Entered By: Worthy Keeler on 12/06/2018 14:41:56 [place], [name] ([id number]) -------------------------------------------------------------------------------- Physician Orders Details Patient Name: [name] Date of Service: 12/06/2018 1:45 PM Medical Record Number: [id number] Patient Account Number: [id number] Date of Birth/Sex: Oct 15, 1950 (68 y.o. F) Treating RN: Army Melia Primary Care Provider: Harrel Lemon Other Clinician: Referring Provider: Harrel Lemon Treating Provider/Extender: Melburn Hake, Shirly Bartosiewicz Weeks in Treatment: 2 Verbal / Phone Orders: No Diagnosis Coding ICD-10 Coding Code Description L24.89 Irritant contact dermatitis due to other agents F03.90 Unspecified dementia without behavioral disturbance Z94.0 Kidney transplant status I10 Essential (primary) hypertension Wound Cleansing Wound #1 Left Gluteal fold o Clean wound with Normal Saline. - in office o Cleanse wound with mild soap and water Wound #2 Right Gluteal fold o Clean wound with Normal Saline. - in office o Cleanse wound with mild soap and water Wound #3 Left,Medial Upper Leg o Clean wound with Normal Saline. -  in office o Cleanse wound with mild soap and water Skin Barriers/Peri-Wound Care Wound #1 Left Gluteal fold o Skin Prep - periwound Wound #2 Right Gluteal fold o Skin Prep - periwound Wound #3 Left,Medial Upper Leg o Skin Prep - periwound Primary Wound Dressing Wound #1 Left Gluteal fold o Silver Alginate Wound #2 Right Gluteal fold o Silver Alginate Wound #3 Left,Medial Upper Leg o Silver Alginate Secondary Dressing Wound #1 Left Gluteal fold o Boardered Foam Dressing [place], Lam ([id number]) Wound #2 Right Gluteal fold o Boardered Foam Dressing Wound #3 Left,Medial Upper Leg o Boardered Foam Dressing Dressing Change Frequency Wound #1 Left Gluteal fold o Change dressing every other day. Wound #2 Right Gluteal fold o Change dressing every other day. Wound #3 Left,Medial Upper Leg o Change dressing every other day. Follow-up Appointments Wound #1 Left Gluteal fold o Return Appointment in 2 weeks. Wound #2 Right Gluteal fold o Return Appointment in 2 weeks. Wound #3 Left,Medial Upper Leg o Return Appointment in 2 weeks. Off-Loading Wound #1 Left Gluteal fold o Turn and reposition every 2 hours o Other: - memory foam cushion while siting Wound #2 Right Gluteal fold o Turn and reposition every 2 hours o Other: - memory foam cushion while siting Wound #3 Left,Medial Upper Leg o Turn and reposition every 2 hours o Other: - memory foam cushion while siting Home Health Wound #1 Left Gluteal fold o [place] Visits - [place] Nurse may visit PRN to address patientos wound care needs. o FACE TO FACE ENCOUNTER: MEDICARE and MEDICAID PATIENTS: I certify that this patient is under my care and that I had a face-to-face encounter that meets the physician face-to-face encounter requirements with this patient on this date. The encounter with the patient was in whole or in part for the  following MEDICAL CONDITION: (primary reason for [place]) MEDICAL NECESSITY: I certify, that based on my findings, NURSING services are a medically necessary  home health service. HOME BOUND STATUS: I certify that my clinical findings support that this patient is homebound (i.e., Due to illness or injury, pt requires aid of supportive devices such as crutches, cane, wheelchairs, walkers, the use of special transportation or the assistance of another person to leave their place of residence. There is a normal inability to leave the home and doing so requires considerable and taxing effort. Other absences are for medical reasons / religious services and are infrequent or of short duration when for other reasons). [place], [name] ([id number]) o If current dressing causes regression in wound condition, may D/C ordered dressing product/s and apply Normal Saline Moist Dressing daily until next [place] / Other MD appointment. [place] of regression in wound condition at [phone]. o Please direct any NON-WOUND related issues/requests for orders to patient's Primary Care Physician Wound #2 Right Gluteal fold o [place] Visits - [place] Nurse may visit PRN to address patientos wound care needs. o FACE TO FACE ENCOUNTER: MEDICARE and MEDICAID PATIENTS: I certify that this patient is under my care and that I had a face-to-face encounter that meets the physician face-to-face encounter requirements with this patient on this date. The encounter with the patient was in whole or in part for the following MEDICAL CONDITION: (primary reason for [place]) MEDICAL NECESSITY: I certify, that based on my findings, NURSING services are a medically necessary home health service. HOME BOUND STATUS: I certify that my clinical findings support that this patient is homebound (i.e., Due to illness or injury, pt requires aid  of supportive devices such as crutches, cane, wheelchairs, walkers, the use of special transportation or the assistance of another person to leave their place of residence. There is a normal inability to leave the home and doing so requires considerable and taxing effort. Other absences are for medical reasons / religious services and are infrequent or of short duration when for other reasons). o If current dressing causes regression in wound condition, may D/C ordered dressing product/s and apply Normal Saline Moist Dressing daily until next [place] / Other MD appointment. [place] of regression in wound condition at [phone]. o Please direct any NON-WOUND related issues/requests for orders to patient's Primary Care Physician Wound #3 Left,Medial Upper Leg o [place] Visits - [place] Nurse may visit PRN to address patientos wound care needs. o FACE TO FACE ENCOUNTER: MEDICARE and MEDICAID PATIENTS: I certify that this patient is under my care and that I had a face-to-face encounter that meets the physician face-to-face encounter requirements with this patient on this date. The encounter with the patient was in whole or in part for the following MEDICAL CONDITION: (primary reason for [place]) MEDICAL NECESSITY: I certify, that based on my findings, NURSING services are a medically necessary home health service. HOME BOUND STATUS: I certify that my clinical findings support that this patient is homebound (i.e., Due to illness or injury, pt requires aid of supportive devices such as crutches, cane, wheelchairs, walkers, the use of special transportation or the assistance of another person to leave their place of residence. There is a normal inability to leave the home and doing so requires considerable and taxing effort. Other absences are for medical reasons / religious services and are infrequent or of  short duration when for other reasons). o If current dressing causes regression in wound condition, may D/C ordered dressing  product/s and apply Normal Saline Moist Dressing daily until next [place] / Other MD appointment. [place] of regression in wound condition at [phone]. o Please direct any NON-WOUND related issues/requests for orders to patient's Primary Care Physician Electronic Signature(s) Signed: 12/06/2018 4:16:03 PM By: Army Melia Signed: 12/06/2018 6:36:47 PM By: Worthy Keeler PA-C Entered By: Army Melia on 12/06/2018 14:39:50 [place], STELL ([id number]) -------------------------------------------------------------------------------- Problem List Details Patient Name: [name] Date of Service: 12/06/2018 1:45 PM Medical Record Number: [id number] Patient Account Number: [id number] Date of Birth/Sex: [birth year] (68 y.o. F) Treating RN: Army Melia Primary Care Provider: Harrel Lemon Other Clinician: Referring Provider: Harrel Lemon Treating Provider/Extender: Melburn Hake, Cam Dauphin Weeks in Treatment: 2 Active Problems ICD-10 Evaluated Encounter Code Description Active Date Today Diagnosis L24.89 Irritant contact dermatitis due to other agents 11/22/2018 No Yes F03.90 Unspecified dementia without behavioral disturbance 11/22/2018 No Yes Z94.0 Kidney transplant status 11/22/2018 No Yes I10 Essential (primary) hypertension 11/22/2018 No Yes Inactive Problems Resolved Problems Electronic Signature(s) Signed: 12/06/2018 2:34:47 PM By: Worthy Keeler PA-C Entered By: Worthy Keeler on 12/06/2018 14:34:47 [place], [name] ([id number]) -------------------------------------------------------------------------------- Progress Note Details Patient Name: [name] Date of Service: 12/06/2018 1:45 PM Medical Record Number: [id number] Patient Account Number: [id number] Date of Birth/Sex: 04-16-1950 (68 y.o. F) Treating RN:  Army Melia Primary Care Provider: Harrel Lemon Other Clinician: Referring Provider: Harrel Lemon Treating Provider/Extender: Melburn Hake, Zanasia Hickson Weeks in Treatment: 2 Subjective Chief Complaint Information obtained from Patient Left gluteal fold ulcer History of Present Illness (HPI) 11/22/2018 patient presents today for initial evaluation in our clinic concerning issues that she has been having with a wound in the left gluteal fold. This has been present since August and was noted initially when she was in the hospital. Fortunately there is no signs of active infection at this time. No fever chills noted. Up to this point DuoDERM has been utilized although I am not sure that is the best treatment option for her based on what I am seeing currently. This actually started as a result of moisture associated skin damage secondary to a C. difficile infection. The patient does have dementia. She is also status post having had a kidney transplant and does have hypertension as well. She does spend a lot of her time during the day sitting which is probably a part of why this is having trouble healing although it is not really a pressure ulcer that may be contributing to the lack of significant and rapid improvement. She still is having diarrhea as well and is still on treatment for the C. difficile infection she is on Dificid. They have been using barrier cream in the gluteal region to try to help protect the area where the dressing was not. 11/29/2018 upon evaluation today patient does not appear to be doing significantly better at this time. She still had some somewhat softer stools but not really as much diarrhea as what she had when the C. difficile infection was not full cycle. Overall things seem to be doing better in that regard although she does have 2 new areas on evaluation today. There is no signs of active infection. No fevers, chills, nausea, vomiting, or diarrhea. 12/06/2018 on  evaluation today patient actually appears to be doing better with regard to the wounds in the gluteal region in general. Overall the biggest issue has been that apparently one of the wounds was missed by the home health nurse and not noted to be open and its been  getting stuck to her brief. Nonetheless other than that and even with that considered she still seems to actually be doing quite well as far as I am seeing today. Patient History Unable to Obtain Patient History due to Dementia. Information obtained from Patient. Family History Heart Disease - Siblings, No family history of Cancer, Diabetes, Hypertension, Kidney Disease, Lung Disease, Seizures, Stroke, Thyroid Problems, Tuberculosis. Social History Former smoker - Quit 40+ years ago, Marital Status - Married, Alcohol Use - Never, Drug Use - No History, Caffeine Use - Rarely. Medical History Endocrine Denies history of Type I Diabetes, Type II Diabetes Neurologic Patient has history of Dementia, Neuropathy - hands ANNAMAE, SHIVLEY ([id number]) Oncologic Denies history of Received Chemotherapy, Received Radiation Hospitalization/Surgery History - [facility] UTI. Medical And Surgical History Notes Genitourinary Kidney Transpant 5+ years ago Review of Systems (ROS) Constitutional Symptoms (General Health) Denies complaints or symptoms of Fatigue, Fever, Chills, Marked Weight Change. Respiratory Denies complaints or symptoms of Chronic or frequent coughs, Shortness of Breath. Cardiovascular Denies complaints or symptoms of Chest pain, LE edema. Psychiatric Denies complaints or symptoms of Anxiety, Claustrophobia. Objective Constitutional Well-nourished and well-hydrated in no acute distress. Vitals Time Taken: 1:52 PM, Height: 59 in, Weight: 193 lbs, BMI: 39, Temperature: 97.7 F, Pulse: 61 bpm, Respiratory Rate: 16 breaths/min, Blood Pressure: 161/82 mmHg. Respiratory normal breathing without difficulty. clear to auscultation  bilaterally. Cardiovascular regular rate and rhythm with normal S1, S2. Psychiatric this patient is able to make decisions and demonstrates good insight into disease process. Alert and Oriented x 3. pleasant and cooperative. General Notes: Patient's wound bed currently showed signs of good granulation epithelization there is no evidence of active infection and overall the patient seems to be making good progress at this point which is excellent news. I am pleased with the way the wound seems to be healing at all 3 locations to be honest. Integumentary (Hair, Skin) Wound #1 status is Open. Original cause of wound was Gradually Appeared. The wound is located on the Left Gluteal fold. The wound measures 1.2cm length x 0.9cm width x 0.1cm depth; 0.848cm^2 area and 0.085cm^3 volume. There is Fat Layer (Subcutaneous Tissue) Exposed exposed. There is no tunneling or undermining noted. There is a medium amount of serous drainage noted. The wound margin is flat and intact. There is large (67-100%) red granulation within the wound bed. There is a small (1-33%) amount of necrotic tissue within the wound bed including Adherent Slough. Wound #2 status is Open. Original cause of wound was Gradually Appeared. The wound is located on the Right Gluteal fold. [place], [place]ha ([id number]) The wound measures 0.5cm length x 0.7cm width x 0.1cm depth; 0.275cm^2 area and 0.027cm^3 volume. There is Fat Layer (Subcutaneous Tissue) Exposed exposed. There is no tunneling or undermining noted. There is a medium amount of serous drainage noted. The wound margin is flat and intact. There is large (67-100%) pink granulation within the wound bed. There is a small (1-33%) amount of necrotic tissue within the wound bed including Adherent Slough. Wound #3 status is Open. Original cause of wound was Gradually Appeared. The wound is located on the Left,Medial Upper Leg. The wound measures 1cm length x 0.4cm width x 0.1cm depth;  0.314cm^2 area and 0.031cm^3 volume. Assessment Active Problems ICD-10 Irritant contact dermatitis due to other agents Unspecified dementia without behavioral disturbance Kidney transplant status Essential (primary) hypertension Plan Wound Cleansing: Wound #1 Left Gluteal fold: Clean wound with Normal Saline. - in office Cleanse wound with mild soap and water  Wound #2 Right Gluteal fold: Clean wound with Normal Saline. - in office Cleanse wound with mild soap and water Wound #3 Left,Medial Upper Leg: Clean wound with Normal Saline. - in office Cleanse wound with mild soap and water Skin Barriers/Peri-Wound Care: Wound #1 Left Gluteal fold: Skin Prep - periwound Wound #2 Right Gluteal fold: Skin Prep - periwound Wound #3 Left,Medial Upper Leg: Skin Prep - periwound Primary Wound Dressing: Wound #1 Left Gluteal fold: Silver Alginate Wound #2 Right Gluteal fold: Silver Alginate Wound #3 Left,Medial Upper Leg: Silver Alginate Secondary Dressing: Wound #1 Left Gluteal fold: Boardered Foam Dressing Wound #2 Right Gluteal fold: Boardered Foam Dressing Wound #3 Left,Medial Upper Leg: [place], [place] ([id number]) Boardered Foam Dressing Dressing Change Frequency: Wound #1 Left Gluteal fold: Change dressing every other day. Wound #2 Right Gluteal fold: Change dressing every other day. Wound #3 Left,Medial Upper Leg: Change dressing every other day. Follow-up Appointments: Wound #1 Left Gluteal fold: Return Appointment in 2 weeks. Wound #2 Right Gluteal fold: Return Appointment in 2 weeks. Wound #3 Left,Medial Upper Leg: Return Appointment in 2 weeks. Off-Loading: Wound #1 Left Gluteal fold: Turn and reposition every 2 hours Other: - memory foam cushion while siting Wound #2 Right Gluteal fold: Turn and reposition every 2 hours Other: - memory foam cushion while siting Wound #3 Left,Medial Upper Leg: Turn and reposition every 2 hours Other: - memory foam cushion  while siting Home Health: Wound #1 Left Gluteal fold: Continue Home Health Visits - [place] Nurse may visit PRN to address patient s wound care needs. FACE TO FACE ENCOUNTER: MEDICARE and MEDICAID PATIENTS: I certify that this patient is under my care and that I had a face-to-face encounter that meets the physician face-to-face encounter requirements with this patient on this date. The encounter with the patient was in whole or in part for the following MEDICAL CONDITION: (primary reason for [place]) MEDICAL NECESSITY: I certify, that based on my findings, NURSING services are a medically necessary home health service. HOME BOUND STATUS: I certify that my clinical findings support that this patient is homebound (i.e., Due to illness or injury, pt requires aid of supportive devices such as crutches, cane, wheelchairs, walkers, the use of special transportation or the assistance of another person to leave their place of residence. There is a normal inability to leave the home and doing so requires considerable and taxing effort. Other absences are for medical reasons / religious services and are infrequent or of short duration when for other reasons). If current dressing causes regression in wound condition, may D/C ordered dressing product/s and apply Normal Saline Moist Dressing daily until next [place] / Other MD appointment. [place] of regression in wound condition at [phone]. Please direct any NON-WOUND related issues/requests for orders to patient's Primary Care Physician Wound #2 Right Gluteal fold: [place] Nurse may visit PRN to address patient s wound care needs. FACE TO FACE ENCOUNTER: MEDICARE and MEDICAID PATIENTS: I certify that this patient is under my care and that I had a face-to-face encounter that meets the physician face-to-face encounter requirements  with this patient on this date. The encounter with the patient was in whole or in part for the following MEDICAL CONDITION: (primary reason for [place]) MEDICAL NECESSITY: I certify, that based on my findings, NURSING services are a medically necessary home health service. HOME BOUND STATUS: I certify that my clinical findings  support that this patient is homebound (i.e., Due to illness or injury, pt requires aid of supportive devices such as crutches, cane, wheelchairs, walkers, the use of special transportation or the assistance of another person to leave their place of residence. There is a normal inability to leave the home and doing so requires considerable and taxing effort. Other absences are for medical reasons / religious services and are infrequent or of short duration when for other reasons). If current dressing causes regression in wound condition, may D/C ordered dressing product/s and apply Normal Saline Moist Dressing daily until next [place] / Other MD appointment. [place] of regression in wound condition at [phone]. Please direct any NON-WOUND related issues/requests for orders to patient's Primary Care Physician Wound #3 Left,Medial Upper Leg: [place] Visits - [place] ([id number]) Home Health Nurse may visit PRN to address patient s wound care needs. FACE TO FACE ENCOUNTER: MEDICARE and MEDICAID PATIENTS: I certify that this patient is under my care and that I had a face-to-face encounter that meets the physician face-to-face encounter requirements with this patient on this date. The encounter with the patient was in whole or in part for the following MEDICAL CONDITION: (primary reason for [place]) MEDICAL NECESSITY: I certify, that based on my findings, NURSING services are a medically necessary home health service. HOME BOUND STATUS: I certify that my clinical findings support that  this patient is homebound (i.e., Due to illness or injury, pt requires aid of supportive devices such as crutches, cane, wheelchairs, walkers, the use of special transportation or the assistance of another person to leave their place of residence. There is a normal inability to leave the home and doing so requires considerable and taxing effort. Other absences are for medical reasons / religious services and are infrequent or of short duration when for other reasons). If current dressing causes regression in wound condition, may D/C ordered dressing product/s and apply Normal Saline Moist Dressing daily until next [place] / Other MD appointment. [place] of regression in wound condition at [phone]. Please direct any NON-WOUND related issues/requests for orders to patient's Primary Care Physician 1. I would recommend currently that we go ahead and initiate treatment with the continuation of silver alginate dressings at all wound locations cover with a border foam dressing. 2. I would recommend as well that we go ahead and have the patient continue using the memory foam cushion which I think is beneficial for her as well we previously discussed this. 3. We will continue to have home health come out and help her with her wound care needs as well. We will see patient back for reevaluation in 2 weeks here in the clinic. If anything worsens or changes patient will contact our office for additional recommendations. Electronic Signature(s) Signed: 12/06/2018 2:42:43 PM By: Worthy Keeler PA-C Entered By: Worthy Keeler on 12/06/2018 14:42:42 [place], [name] ([id number]) -------------------------------------------------------------------------------- ROS/PFSH Details Patient Name: [name] Date of Service: 12/06/2018 1:45 PM Medical Record Number: [id number] Patient Account Number: [id number] Date of Birth/Sex: Mar 05, 1950 (68 y.o. F) Treating RN: Army Melia Primary Care Provider: Harrel Lemon Other Clinician: Referring Provider: Harrel Lemon Treating Provider/Extender: Melburn Hake, Zali Kamaka Weeks in Treatment: 2 Unable to Obtain Patient History due to oo Dementia Information Obtained From Patient Constitutional Symptoms (General Health) Complaints and Symptoms: Negative for: Fatigue; Fever; Chills; Marked Weight Change Respiratory Complaints and Symptoms: Negative for: Chronic or frequent coughs; Shortness  of Breath Cardiovascular Complaints and Symptoms: Negative for: Chest pain; LE edema Psychiatric Complaints and Symptoms: Negative for: Anxiety; Claustrophobia Endocrine Medical History: Negative for: Type I Diabetes; Type II Diabetes Genitourinary Medical History: Past Medical History Notes: Kidney Transpant 5+ years ago Neurologic Medical History: Positive for: Dementia; Neuropathy - hands Oncologic Medical History: Negative for: Received Chemotherapy; Received Radiation Immunizations Pneumococcal Vaccine: Received Pneumococcal Vaccination: Yes NAZARETH, NORENBERG ([id number]) Implantable Devices Yes Hospitalization / Surgery History Type of Hospitalization/Surgery [facility] UTI Family and Social History Cancer: No; Diabetes: No; Heart Disease: Yes - Siblings; Hypertension: No; Kidney Disease: No; Lung Disease: No; Seizures: No; Stroke: No; Thyroid Problems: No; Tuberculosis: No; Former smoker - Quit 40+ years ago; Marital Status - Married; Alcohol Use: Never; Drug Use: No History; Caffeine Use: Rarely Physician Affirmation I have reviewed and agree with the above information. Electronic Signature(s) Signed: 12/06/2018 4:16:03 PM By: Army Melia Signed: 12/06/2018 6:36:47 PM By: Worthy Keeler PA-C Entered By: Worthy Keeler on 12/06/2018 14:41:39 [place], [name] ([id number]) -------------------------------------------------------------------------------- SuperBill Details Patient Name: [name] Date of  Service: 12/06/2018 Medical Record Number: [id number] Patient Account Number: [id number] Date of Birth/Sex: 12-Feb-1951 (68 y.o. F) Treating RN: Army Melia Primary Care Provider: Harrel Lemon Other Clinician: Referring Provider: Harrel Lemon Treating Provider/Extender: Melburn Hake, Reet Scharrer Weeks in Treatment: 2 Diagnosis Coding ICD-10 Codes Code Description L24.89 Irritant contact dermatitis due to other agents F03.90 Unspecified dementia without behavioral disturbance Z94.0 Kidney transplant status I10 Essential (primary) hypertension Facility Procedures CPT4 Code: [id number] Description: 99214 - WOUND CARE VISIT-LEV 4 EST PT Modifier: Quantity: 1 Physician Procedures CPT4 Code: 4103013 Description: 14388 - WC PHYS LEVEL 4 - EST PT ICD-10 Diagnosis Description L24.89 Irritant contact dermatitis due to other agents F03.90 Unspecified dementia without behavioral disturbance Z94.0 Kidney transplant status I10 Essential (primary)  hypertension Modifier: Quantity: 1 Electronic Signature(s) Signed: 12/06/2018 2:42:54 PM By: Worthy Keeler PA-C Entered By: Worthy Keeler on 12/06/2018 14:42:53

## 2018-12-20 ENCOUNTER — Other Ambulatory Visit: Payer: Self-pay

## 2018-12-20 ENCOUNTER — Encounter: Payer: Medicare Other | Attending: Physician Assistant | Admitting: Physician Assistant

## 2018-12-20 DIAGNOSIS — I10 Essential (primary) hypertension: Secondary | ICD-10-CM | POA: Insufficient documentation

## 2018-12-20 DIAGNOSIS — F0390 Unspecified dementia without behavioral disturbance: Secondary | ICD-10-CM | POA: Diagnosis not present

## 2018-12-20 DIAGNOSIS — Z87891 Personal history of nicotine dependence: Secondary | ICD-10-CM | POA: Insufficient documentation

## 2018-12-20 DIAGNOSIS — Z940 Kidney transplant status: Secondary | ICD-10-CM | POA: Diagnosis not present

## 2018-12-20 DIAGNOSIS — L2489 Irritant contact dermatitis due to other agents: Secondary | ICD-10-CM | POA: Diagnosis present

## 2018-12-20 NOTE — Progress Notes (Addendum)
[name], [name] ([id number]) Visit Report for 12/20/2018 Chief Complaint Document Details Patient Name: [name], [name] Date of Service: 12/20/2018 1:30 PM Medical Record Number: [id number] Patient Account Number: [id number] Date of Birth/Sex: 1950/12/24 (68 y.o. F) Treating RN: Army Melia Primary Care Provider: Harrel Lemon Other Clinician: Referring Provider: Harrel Lemon Treating Provider/Extender: Melburn Hake, HOYT Weeks in Treatment: 4 Information Obtained from: Patient Chief Complaint Left gluteal fold ulcer Electronic Signature(s) Signed: 12/20/2018 1:12:32 PM By: Worthy Keeler PA-C Entered By: Worthy Keeler on 12/20/2018 13:12:32 [name], [name] -------------------------------------------------------------------------------- Debridement Details Patient Name: [name] Date of Service: 12/20/2018 1:30 PM Medical Record Number: [id number] Patient Account Number: [id number] Date of Birth/Sex: [birth year] (68 y.o. F) Treating RN: Army Melia Primary Care Provider: Harrel Lemon Other Clinician: Referring Provider: Harrel Lemon Treating Provider/Extender: Melburn Hake, HOYT Weeks in Treatment: 4 Debridement Performed for Wound #1 Left Gluteal fold Assessment: Performed By: Physician STONE III, HOYT E., PA-C Debridement Type: Chemical/Enzymatic/Mechanical Agent Used: gauze and saline Level of Consciousness (Pre- Awake and Alert procedure): Pre-procedure Verification/Time Yes - 13:51 Out Taken: Start Time: 13:52 Pain Control: Lidocaine Instrument: Other : GAUZE Bleeding: None End Time: 13:53 Response to Treatment: Procedure was tolerated well Level of Consciousness Awake and Alert (Post-procedure): Post Debridement Measurements of Total Wound Length: (cm) 0.7 Width: (cm) 0.5 Depth: (cm) 0.1 Volume: (cm) 0.027 Character of Wound/Ulcer Post Debridement: Stable Post Procedure Diagnosis Same as Pre-procedure Electronic  Signature(s) Signed: 12/20/2018 4:39:17 PM By: Army Melia Signed: 12/20/2018 6:06:55 PM By: Worthy Keeler PA-C Entered By: Army Melia on 12/20/2018 13:54:02 [name], [name] ([id number]) -------------------------------------------------------------------------------- Debridement Details Patient Name: [name] Date of Service: 12/20/2018 1:30 PM Medical Record Number: [id number] Patient Account Number: [id number] Date of Birth/Sex: January 10, 1951 (68 y.o. F) Treating RN: Army Melia Primary Care Provider: Harrel Lemon Other Clinician: Referring Provider: Harrel Lemon Treating Provider/Extender: Melburn Hake, HOYT Weeks in Treatment: 4 Debridement Performed for Wound #2 Right Gluteal fold Assessment: Performed By: Physician STONE III, HOYT E., PA-C Debridement Type: Chemical/Enzymatic/Mechanical Agent Used: gauze and saline Level of Consciousness (Pre- Awake and Alert procedure): Pre-procedure Verification/Time Yes - 13:51 Out Taken: Start Time: 13:52 Pain Control: Lidocaine Instrument: Other : GAUZE Bleeding: None End Time: 13:53 Response to Treatment: Procedure was tolerated well Level of Consciousness Awake and Alert (Post-procedure): Post Debridement Measurements of Total Wound Length: (cm) 0.4 Width: (cm) 0.5 Depth: (cm) 0.1 Volume: (cm) 0.016 Character of Wound/Ulcer Post Debridement: Stable Post Procedure Diagnosis Same as Pre-procedure Electronic Signature(s) Signed: 12/20/2018 4:39:17 PM By: Army Melia Signed: 12/20/2018 6:06:55 PM By: Worthy Keeler PA-C Entered By: Army Melia on 12/20/2018 13:54:23 [name], [name] ([id number]) -------------------------------------------------------------------------------- Debridement Details Patient Name: [name] Date of Service: 12/20/2018 1:30 PM Medical Record Number: [id number] Patient Account Number: [id number] Date of Birth/Sex: August 10, 1950 (68 y.o. F) Treating RN: Army Melia Primary Care  Provider: Harrel Lemon Other Clinician: Referring Provider: Harrel Lemon Treating Provider/Extender: Melburn Hake, HOYT Weeks in Treatment: 4 Debridement Performed for Wound #3 Left,Medial Upper Leg Assessment: Performed By: Physician STONE III, HOYT E., PA-C Debridement Type: Chemical/Enzymatic/Mechanical Agent Used: gauze and saline Level of Consciousness (Pre- Awake and Alert procedure): Pre-procedure Verification/Time Yes - 13:51 Out Taken: Start Time: 13:52 Pain Control: Lidocaine Instrument: Other : GAUZE Bleeding: None End Time: 13:53 Response to Treatment: Procedure was tolerated well Level of Consciousness Awake and Alert (Post-procedure): Post Debridement Measurements of Total Wound Length: (cm) 0.1 Width: (cm) 0.1 Depth: (cm) 0.1 Volume: (cm) 0.001 Character of Wound/Ulcer Post Debridement: Stable Post Procedure Diagnosis  Same as Pre-procedure Electronic Signature(s) Signed: 12/20/2018 4:39:17 PM By: Army Melia Signed: 12/20/2018 6:06:55 PM By: Worthy Keeler PA-C Entered By: Army Melia on 12/20/2018 13:54:45 [name], [name] ([id number]) -------------------------------------------------------------------------------- HPI Details Patient Name: [name] Date of Service: 12/20/2018 1:30 PM Medical Record Number: [id number] Patient Account Number: [id number] Date of Birth/Sex: 1951/01/12 (68 y.o. F) Treating RN: Army Melia Primary Care Provider: Harrel Lemon Other Clinician: Referring Provider: Harrel Lemon Treating Provider/Extender: Melburn Hake, HOYT Weeks in Treatment: 4 History of Present Illness HPI Description: 11/22/2018 patient presents today for initial evaluation in our clinic concerning issues that she has been having with a wound in the left gluteal fold. This has been present since August and was noted initially when she was in the hospital. Fortunately there is no signs of active infection at this time. No fever chills noted. Up  to this point DuoDERM has been utilized although I am not sure that is the best treatment option for her based on what I am seeing currently. This actually started as a result of moisture associated skin damage secondary to a C. difficile infection. The patient does have dementia. She is also status post having had a kidney transplant and does have hypertension as well. She does spend a lot of her time during the day sitting which is probably a part of why this is having trouble healing although it is not really a pressure ulcer that may be contributing to the lack of significant and rapid improvement. She still is having diarrhea as well and is still on treatment for the C. difficile infection she is on Dificid. They have been using barrier cream in the gluteal region to try to help protect the area where the dressing was not. 11/29/2018 upon evaluation today patient does not appear to be doing significantly better at this time. She still had some somewhat softer stools but not really as much diarrhea as what she had when the C. difficile infection was not full cycle. Overall things seem to be doing better in that regard although she does have 2 new areas on evaluation today. There is no signs of active infection. No fevers, chills, nausea, vomiting, or diarrhea. 12/06/2018 on evaluation today patient actually appears to be doing better with regard to the wounds in the gluteal region in general. Overall the biggest issue has been that apparently one of the wounds was missed by the home health nurse and not noted to be open and its been getting stuck to her brief. Nonetheless other than that and even with that considered she still seems to actually be doing quite well as far as I am seeing today. 12/20/2018 on evaluation today patient appears to be doing very well in regard to her wounds. Fortunately there is no sign of active infection at this time which is great news. She has been tolerating the  dressing changes without complication although [facility] home care is no longer coming out they just discontinued the patient's treatment all of a sudden standing that they were complete as far as what they could do. Nonetheless unfortunately the patient still has wounds open and has not had any dressings to put on the area they have been doing the best they could until they came in here today. There is no evidence of infection and things seem to be doing quite well. Electronic Signature(s) Signed: 12/20/2018 1:53:40 PM By: Worthy Keeler PA-C Entered By: Worthy Keeler on 12/20/2018 13:53:40 [place], [name] ([id number]) -------------------------------------------------------------------------------- Physical Exam Details  Patient Name: [name], [name] Date of Service: 12/20/2018 1:30 PM Medical Record Number: [id number] Patient Account Number: [id number] Date of Birth/Sex: December 05, 1950 (68 y.o. F) Treating RN: Army Melia Primary Care Provider: Harrel Lemon Other Clinician: Referring Provider: Harrel Lemon Treating Provider/Extender: Melburn Hake, HOYT Weeks in Treatment: 4 Constitutional Obese and well-hydrated in no acute distress. Respiratory normal breathing without difficulty. clear to auscultation bilaterally. Cardiovascular regular rate and rhythm with normal S1, S2. Psychiatric Patient is not able to cooperate in decision making regarding care. Patient has dementia. pleasant and cooperative. Notes Patient's wound bed currently showed signs of good granulation at this time the does not appear to be any evidence of active infection and overall I am very pleased with how things are progressing at this point. Even though she has not had dressings on I do not see any detriment to the wound beds although obviously we might of had more healing even than what we see if she had had the Dressing material.. Electronic Signature(s) Signed: 12/20/2018 1:54:13 PM By: Worthy Keeler  PA-C Entered By: Worthy Keeler on 12/20/2018 13:54:13 [place], [name] ([id number]) -------------------------------------------------------------------------------- Physician Orders Details Patient Name: [name] Date of Service: 12/20/2018 1:30 PM Medical Record Number: [id number] Patient Account Number: [id number] Date of Birth/Sex: December 26, 1950 (68 y.o. F) Treating RN: Army Melia Primary Care Provider: Harrel Lemon Other Clinician: Referring Provider: Harrel Lemon Treating Provider/Extender: Melburn Hake, HOYT Weeks in Treatment: 4 Verbal / Phone Orders: No Diagnosis Coding ICD-10 Coding Code Description L24.89 Irritant contact dermatitis due to other agents F03.90 Unspecified dementia without behavioral disturbance Z94.0 Kidney transplant status I10 Essential (primary) hypertension Wound Cleansing Wound #1 Left Gluteal fold o Clean wound with Normal Saline. - in office o Cleanse wound with mild soap and water Wound #2 Right Gluteal fold o Clean wound with Normal Saline. - in office o Cleanse wound with mild soap and water Wound #3 Left,Medial Upper Leg o Clean wound with Normal Saline. - in office o Cleanse wound with mild soap and water Skin Barriers/Peri-Wound Care Wound #1 Left Gluteal fold o Skin Prep - periwound Wound #2 Right Gluteal fold o Skin Prep - periwound Wound #3 Left,Medial Upper Leg o Skin Prep - periwound Primary Wound Dressing Wound #1 Left Gluteal fold o Silver Alginate Wound #2 Right Gluteal fold o Silver Alginate Wound #3 Left,Medial Upper Leg o Silver Alginate Secondary Dressing Wound #1 Left Gluteal fold o Boardered Foam Dressing [place], Natania ([id number]) Wound #2 Right Gluteal fold o Boardered Foam Dressing Wound #3 Left,Medial Upper Leg o Boardered Foam Dressing Dressing Change Frequency Wound #1 Left Gluteal fold o Change dressing every other day. Wound #2 Right Gluteal fold o Change  dressing every other day. Wound #3 Left,Medial Upper Leg o Change dressing every other day. Follow-up Appointments Wound #1 Left Gluteal fold o Return Appointment in 2 weeks. Wound #2 Right Gluteal fold o Return Appointment in 2 weeks. Wound #3 Left,Medial Upper Leg o Return Appointment in 2 weeks. Off-Loading Wound #1 Left Gluteal fold o Turn and reposition every 2 hours o Other: - memory foam cushion while siting Wound #2 Right Gluteal fold o Turn and reposition every 2 hours o Other: - memory foam cushion while siting Wound #3 Left,Medial Upper Leg o Turn and reposition every 2 hours o Other: - memory foam cushion while Sports administrator Signature(s) Signed: 12/20/2018 4:39:17 PM By: Army Melia Signed: 12/20/2018 6:06:55 PM By: Worthy Keeler PA-C Entered By: Army Melia on 12/20/2018 13:51:44 [place], [place] ([id number]) --------------------------------------------------------------------------------  Problem List Details Patient Name: [name], [name] Date of Service: 12/20/2018 1:30 PM Medical Record Number: [id number] Patient Account Number: [id number] Date of Birth/Sex: [birth year] (68 y.o. F) Treating RN: Army Melia Primary Care Provider: Harrel Lemon Other Clinician: Referring Provider: Harrel Lemon Treating Provider/Extender: Melburn Hake, HOYT Weeks in Treatment: 4 Active Problems ICD-10 Evaluated Encounter Code Description Active Date Today Diagnosis L24.89 Irritant contact dermatitis due to other agents 11/22/2018 No Yes F03.90 Unspecified dementia without behavioral disturbance 11/22/2018 No Yes Z94.0 Kidney transplant status 11/22/2018 No Yes I10 Essential (primary) hypertension 11/22/2018 No Yes Inactive Problems Resolved Problems Electronic Signature(s) Signed: 12/20/2018 1:12:25 PM By: Worthy Keeler PA-C Entered By: Worthy Keeler on 12/20/2018 13:12:24 [place]  ([id number]) -------------------------------------------------------------------------------- Progress Note Details Patient Name: [name] Date of Service: 12/20/2018 1:30 PM Medical Record Number: [id number] Patient Account Number: [id number] Date of Birth/Sex: August 24, 1950 (68 y.o. F) Treating RN: Army Melia Primary Care Provider: Harrel Lemon Other Clinician: Referring Provider: Harrel Lemon Treating Provider/Extender: Melburn Hake, HOYT Weeks in Treatment: 4 Subjective Chief Complaint Information obtained from Patient Left gluteal fold ulcer History of Present Illness (HPI) 11/22/2018 patient presents today for initial evaluation in our clinic concerning issues that she has been having with a wound in the left gluteal fold. This has been present since August and was noted initially when she was in the hospital. Fortunately there is no signs of active infection at this time. No fever chills noted. Up to this point DuoDERM has been utilized although I am not sure that is the best treatment option for her based on what I am seeing currently. This actually started as a result of moisture associated skin damage secondary to a C. difficile infection. The patient does have dementia. She is also status post having had a kidney transplant and does have hypertension as well. She does spend a lot of her time during the day sitting which is probably a part of why this is having trouble healing although it is not really a pressure ulcer that may be contributing to the lack of significant and rapid improvement. She still is having diarrhea as well and is still on treatment for the C. difficile infection she is on Dificid. They have been using barrier cream in the gluteal region to try to help protect the area where the dressing was not. 11/29/2018 upon evaluation today patient does not appear to be doing significantly better at this time. She still had some somewhat softer stools but not really  as much diarrhea as what she had when the C. difficile infection was not full cycle. Overall things seem to be doing better in that regard although she does have 2 new areas on evaluation today. There is no signs of active infection. No fevers, chills, nausea, vomiting, or diarrhea. 12/06/2018 on evaluation today patient actually appears to be doing better with regard to the wounds in the gluteal region in general. Overall the biggest issue has been that apparently one of the wounds was missed by the home health nurse and not noted to be open and its been getting stuck to her brief. Nonetheless other than that and even with that considered she still seems to actually be doing quite well as far as I am seeing today. 12/20/2018 on evaluation today patient appears to be doing very well in regard to her wounds. Fortunately there is no sign of active infection at this time which is great news. She has been tolerating the dressing changes without complication although  [facility] home [facility] is no longer coming out they just discontinued the patient's treatment all of a sudden standing that they were complete as far as what they could do. Nonetheless unfortunately the patient still has wounds open and has not had any dressings to put on the area they have been doing the best they could until they came in here today. There is no evidence of infection and things seem to be doing quite well. Patient History Unable to Obtain Patient History due to Dementia. Information obtained from Patient. Family History Heart Disease - Siblings, No family history of Cancer, Diabetes, Hypertension, Kidney Disease, Lung Disease, Seizures, Stroke, Thyroid Problems, Tuberculosis. Social History Former smoker - Quit 40+ years ago, Marital Status - Married, Alcohol Use - Never, Drug Use - No History, Caffeine Use - [place], Cherissa ([id number]) Rarely. Medical History Endocrine Denies history of Type I Diabetes, Type II  Diabetes Neurologic Patient has history of Dementia, Neuropathy - hands Oncologic Denies history of Received Chemotherapy, Received Radiation Hospitalization/Surgery History - [facility] UTI. Medical And Surgical History Notes Genitourinary Kidney Transpant 5+ years ago Review of Systems (ROS) Constitutional Symptoms (General Health) Denies complaints or symptoms of Fatigue, Fever, Chills, Marked Weight Change. Respiratory Denies complaints or symptoms of Chronic or frequent coughs, Shortness of Breath. Cardiovascular Denies complaints or symptoms of Chest pain, LE edema. Psychiatric Denies complaints or symptoms of Anxiety, Claustrophobia. Objective Constitutional Obese and well-hydrated in no acute distress. Vitals Time Taken: 1:25 PM, Height: 59 in, Weight: 193 lbs, BMI: 39, Temperature: 97.7 F, Pulse: 61 bpm, Respiratory Rate: 16 breaths/min, Blood Pressure: 114/80 mmHg. Respiratory normal breathing without difficulty. clear to auscultation bilaterally. Cardiovascular regular rate and rhythm with normal S1, S2. Psychiatric Patient is not able to cooperate in decision making regarding care. Patient has dementia. pleasant and cooperative. General Notes: Patient's wound bed currently showed signs of good granulation at this time the does not appear to be any evidence of active infection and overall I am very pleased with how things are progressing at this point. Even though she has not had dressings on I do not see any detriment to the wound beds although obviously we might of had more healing even than what we see if she had had the Dressing material.. Integumentary (Hair, Skin) [name], [name] ([id number]) Wound #1 status is Open. Original cause of wound was Gradually Appeared. The wound is located on the Left Gluteal fold. The wound measures 0.7cm length x 0.5cm width x 0.1cm depth; 0.275cm^2 area and 0.027cm^3 volume. There is Fat Layer (Subcutaneous Tissue) Exposed exposed.  There is no tunneling or undermining noted. There is a medium amount of serous drainage noted. The wound margin is flat and intact. There is large (67-100%) red granulation within the wound bed. There is a small (1-33%) amount of necrotic tissue within the wound bed including Adherent Slough. Wound #2 status is Open. Original cause of wound was Gradually Appeared. The wound is located on the Right Gluteal fold. The wound measures 0.4cm length x 0.5cm width x 0.1cm depth; 0.157cm^2 area and 0.016cm^3 volume. There is Fat Layer (Subcutaneous Tissue) Exposed exposed. There is no tunneling or undermining noted. There is a medium amount of serous drainage noted. The wound margin is flat and intact. There is large (67-100%) pink granulation within the wound bed. There is a small (1-33%) amount of necrotic tissue within the wound bed including Adherent Slough. Wound #3 status is Open. Original cause of wound was Gradually Appeared. The wound is located on the  Left,Medial Upper Leg. The wound measures 0.1cm length x 0.1cm width x 0.1cm depth; 0.008cm^2 area and 0.001cm^3 volume. There is Fat Layer (Subcutaneous Tissue) Exposed exposed. There is no tunneling or undermining noted. The wound margin is flat and intact. There is large (67-100%) red, pink granulation within the wound bed. There is no necrotic tissue within the wound bed. 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 There was a Chemical/Enzymatic/Mechanical debridement performed by STONE III, HOYT E., PA-C. With the following instrument (s): GAUZE after achieving pain control using Lidocaine. Other agent used was gauze and saline. A time out was conducted at  13:51, prior to the start of the procedure. There was no bleeding. The procedure was tolerated well. Post Debridement Measurements: 0.7cm length x 0.5cm width x 0.1cm depth; 0.027cm^3 volume. Character of Wound/Ulcer Post Debridement is stable. Post procedure Diagnosis Wound #1: Same as Pre-Procedure Plan Wound Cleansing: Wound #1 Left Gluteal fold: Clean wound with Normal Saline. - in office Cleanse wound with mild soap and water Wound #2 Right Gluteal fold: [name], [name] ([id number]) Clean wound with Normal Saline. - in office Cleanse wound with mild soap and water Wound #3 Left,Medial Upper Leg: Clean wound with Normal Saline. - in office Cleanse wound with mild soap and water Skin Barriers/Peri-Wound Care: Wound #1 Left Gluteal fold: Skin Prep - periwound Wound #2 Right Gluteal fold: Skin Prep - periwound Wound #3 Left,Medial Upper Leg: Skin Prep - periwound Primary Wound Dressing: Wound #1 Left Gluteal fold: Silver Alginate Wound #2 Right Gluteal fold: Silver Alginate Wound #3 Left,Medial Upper Leg: Silver Alginate Secondary Dressing: Wound #1 Left Gluteal fold: Boardered Foam Dressing Wound #2 Right Gluteal fold: Boardered Foam Dressing Wound #3 Left,Medial Upper Leg: Boardered Foam Dressing Dressing Change Frequency: Wound #1 Left Gluteal fold: Change dressing every other day. Wound #2 Right Gluteal fold: Change dressing every other day. Wound #3 Left,Medial Upper Leg: Change dressing every other day. Follow-up Appointments: Wound #1 Left Gluteal fold: Return Appointment in 2 weeks. Wound #2 Right Gluteal fold: Return Appointment in 2 weeks. Wound #3 Left,Medial Upper Leg: Return Appointment in 2 weeks. Off-Loading: Wound #1 Left Gluteal fold: Turn and reposition every 2 hours Other: - memory foam cushion while siting Wound #2 Right Gluteal fold: Turn and reposition every 2 hours Other: - memory foam cushion while siting Wound #3 Left,Medial Upper  Leg: Turn and reposition every 2 hours Other: - memory foam cushion while siting 1. My suggestion currently is good to be that we continue with the current wound care measures as things seem to be doing well and I see no reason to make a change in the treatment regimen if she is doing so well. This includes using the skin prep to the periwound followed by the silver alginate dressing and then subsequently using the border foam. She will continue as well sitting on the memory foam cushion which I think has been very beneficial. 2. I do think she needs to avoid sitting on any harder surfaces as that obviously can make things worse. [name], [name] ([id number]) We will see patient back for reevaluation in 2 weeks here in the clinic. If anything worsens or changes patient will contact our office for additional recommendations. Electronic Signature(s) Signed: 12/20/2018 1:55:06 PM By: Worthy Keeler PA-C Entered By:  Worthy Keeler on 12/20/2018 13:55:05 [name], [name] ([id number]) -------------------------------------------------------------------------------- ROS/PFSH Details Patient Name: [name], [name] Date of Service: 12/20/2018 1:30 PM Medical Record Number: [id number] Patient Account Number: [id number] Date of Birth/Sex: 12/30/1950 (68 y.o. F) Treating RN: Army Melia Primary Care Provider: Harrel Lemon Other Clinician: Referring Provider: Harrel Lemon Treating Provider/Extender: Melburn Hake, HOYT Weeks in Treatment: 4 Unable to Obtain Patient History due to oo Dementia Information Obtained From Patient Constitutional Symptoms (General Health) Complaints and Symptoms: Negative for: Fatigue; Fever; Chills; Marked Weight Change Respiratory Complaints and Symptoms: Negative for: Chronic or frequent coughs; Shortness of Breath Cardiovascular Complaints and Symptoms: Negative for: Chest pain; LE edema Psychiatric Complaints and Symptoms: Negative for: Anxiety;  Claustrophobia Endocrine Medical History: Negative for: Type I Diabetes; Type II Diabetes Genitourinary Medical History: Past Medical History Notes: Kidney Transpant 5+ years ago Neurologic Medical History: Positive for: Dementia; Neuropathy - hands Oncologic Medical History: Negative for: Received Chemotherapy; Received Radiation Immunizations Pneumococcal Vaccine: Received Pneumococcal Vaccination: Yes KYNLEY, METZGER ([id number]) Implantable Devices Yes Hospitalization / Surgery History Type of Hospitalization/Surgery [facility] UTI Family and Social History Cancer: No; Diabetes: No; Heart Disease: Yes - Siblings; Hypertension: No; Kidney Disease: No; Lung Disease: No; Seizures: No; Stroke: No; Thyroid Problems: No; Tuberculosis: No; Former smoker - Quit 40+ years ago; Marital Status - Married; Alcohol Use: Never; Drug Use: No History; Caffeine Use: Rarely Physician Affirmation I have reviewed and agree with the above information. Electronic Signature(s) Signed: 12/20/2018 4:39:17 PM By: Army Melia Signed: 12/20/2018 6:06:55 PM By: Worthy Keeler PA-C Entered By: Worthy Keeler on 12/20/2018 13:53:51 [place], [name] ([id number]) -------------------------------------------------------------------------------- SuperBill Details Patient Name: [name] Date of Service: 12/20/2018 Medical Record Number: [id number] Patient Account Number: [id number] Date of Birth/Sex: Apr 13, 1950 (68 y.o. F) Treating RN: Army Melia Primary Care Provider: Harrel Lemon Other Clinician: Referring Provider: Harrel Lemon Treating Provider/Extender: Melburn Hake, HOYT Weeks in Treatment: 4 Diagnosis Coding ICD-10 Codes Code Description L24.89 Irritant contact dermatitis due to other agents F03.90 Unspecified dementia without behavioral disturbance Z94.0 Kidney transplant status I10 Essential (primary) hypertension Physician Procedures CPT4 Code: 3220254 Description: 27062 - WC PHYS  LEVEL 4 - EST PT ICD-10 Diagnosis Description L24.89 Irritant contact dermatitis due to other agents F03.90 Unspecified dementia without behavioral disturbance Z94.0 Kidney transplant status I10 Essential (primary)  hypertension Modifier: Quantity: 1 Electronic Signature(s) Signed: 12/20/2018 1:55:38 PM By: Worthy Keeler PA-C Entered By: Worthy Keeler on 12/20/2018 13:55:38

## 2018-12-20 NOTE — Progress Notes (Addendum)
DAINELLE, HUN ([id number]) Visit Report for 12/20/2018 Arrival Information Details Patient Name: [name], [name] Date of Service: 12/20/2018 1:30 PM Medical Record Number: [id number] Patient Account Number: [id number] Date of Birth/Sex: [birth year] (68 y.o. F) Treating RN: Army Melia Primary Care Marqual Mi: Harrel Lemon Other Clinician: Referring Kennedey Digilio: Harrel Lemon Treating Rama Mcclintock/Extender: Melburn Hake, HOYT Weeks in Treatment: 4 Visit Information History Since Last Visit Added or deleted any medications: No Patient Arrived: Ambulatory Any new allergies or adverse reactions: No Arrival Time: 13:27 Had a fall or experienced change in No Accompanied By: daughter activities of daily living that may affect Transfer Assistance: None risk of falls: Patient Identification Verified: Yes Signs or symptoms of abuse/neglect since last visito No Secondary Verification Process Completed: Yes Hospitalized since last visit: No Patient Requires Transmission-Based No Implantable device outside of the clinic excluding No Precautions: cellular tissue based products placed in the center Patient Has Alerts: Yes since last visit: Patient Alerts: NOT Has Dressing in Place as Prescribed: Yes Diabetic Pain Present Now: No Electronic Signature(s) Signed: 12/20/2018 4:57:32 PM By: Lorine Bears RCP, RRT, CHT Entered By: Lorine Bears on 12/20/2018 13:28:38 [place] ([id number]) -------------------------------------------------------------------------------- Lower Extremity Assessment Details Patient Name: [name] Date of Service: 12/20/2018 1:30 PM Medical Record Number: [id number] Patient Account Number: [id number] Date of Birth/Sex: [birth year] (68 y.o. F) Treating RN: Harold Barban Primary Care Weronika Birch: Harrel Lemon Other Clinician: Referring Maddisyn Hegwood: Harrel Lemon Treating Even Budlong/Extender: Melburn Hake, HOYT Weeks in Treatment:  4 Electronic Signature(s) Signed: 12/20/2018 4:15:06 PM By: Harold Barban Entered By: Harold Barban on 12/20/2018 13:26:24 TYQUASIA, PANT ([id number]) -------------------------------------------------------------------------------- Multi Wound Chart Details Patient Name: [name] Date of Service: 12/20/2018 1:30 PM Medical Record Number: [id number] Patient Account Number: [id number] Date of Birth/Sex: [birth year] (68 y.o. 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) Dermatitis (IAD) Dermatitis (IAD) Secondary Etiology: Pressure Ulcer N/A N/A Comorbid History: Dementia, Neuropathy Dementia, Neuropathy Dementia, Neuropathy Date Acquired: 10/16/2018 11/29/2018 11/29/2018 Weeks of Treatment: 4 3 3  Wound Status: Open Open Open Measurements L x W x D 0.7x0.5x0.1 0.4x0.5x0.1 0.1x0.1x0.1 (cm) Area (cm) : 0.275 0.157 0.008 Volume (cm) : 0.027 0.016 0.001 % Reduction in Area: 87.70% 81.50% 98.30% % Reduction in Volume: 87.90% 81.20% 97.90% Classification: Full Thickness Without Full Thickness Without Full Thickness Without Exposed Support Structures Exposed Support Structures Exposed Support Structures Exudate Amount: Medium Medium N/A Exudate Type: Serous Serous N/A Exudate Color: Physiological scientist N/A Wound Margin: Flat and Intact Flat and Intact Flat and Intact Granulation Amount:  Large (67-100%) Large (67-100%) Large (67-100%) Granulation Quality: Red Pink Red, Pink Necrotic Amount: Small (1-33%) Small (1-33%) None Present (0%) Exposed Structures: Fat Layer (Subcutaneous Fat Layer (Subcutaneous Fat Layer (Subcutaneous Tissue) Exposed: Yes Tissue) Exposed: Yes Tissue) Exposed: Yes Fascia: No Fascia: No Fascia: No Tendon: No Tendon: No Tendon: No Muscle: No Muscle: No Muscle: No OHANIAN, Andreea ([id number]) Joint: No Joint: No Joint: No Bone: No Bone: No Bone: No Epithelialization: None None N/A Treatment Notes Electronic Signature(s) Signed: 12/20/2018 4:39:17 PM By: Army Melia Entered By: Army Melia on 12/20/2018 13:48:19 [place], Neri ([id number]) --------------------------------------------------------------------------------  Multi-Disciplinary Care Plan Details Patient Name: Sheri, Navarro Date of Service: 12/20/2018 1:30 PM Medical Record Number: [id number] Patient Account Number: [id number] Date of Birth/Sex: 05-31-1950 (68 y.o. F) Treating RN: Army Melia Primary Care Elmar Antigua: Harrel Lemon Other Clinician: Referring Fianna Snowball: Harrel Lemon Treating Shyann Hefner/Extender: Melburn Hake, HOYT Weeks in Treatment: 4 Active Inactive Abuse / Safety / Falls / Self Care Management Nursing Diagnoses: Potential for falls Goals: Patient/caregiver will demonstrate safe use of adaptive devices to increase mobility Date Initiated: 11/22/2018 Target Resolution Date: 12/21/2018 Goal Status: Active Interventions: Assess: immobility, friction, shearing, incontinence upon admission and as needed Notes: Orientation to the Wound Care Program Nursing Diagnoses: Knowledge deficit related to the wound healing center program Goals: Patient/caregiver will verbalize understanding of the [place] Program Date Initiated: 11/22/2018 Target Resolution Date: 12/21/2018 Goal Status: Active Interventions: Provide education on orientation to the wound  center Notes: Pressure Nursing Diagnoses: Knowledge deficit related to management of pressures ulcers Goals: Patient/caregiver will verbalize risk factors for pressure ulcer development Date Initiated: 11/22/2018 Target Resolution Date: 12/21/2018 Goal Status: Active Interventions: Assess: immobility, friction, shearing, incontinence upon admission and as needed MANUELA, HALBUR ([id number]) Notes: Wound/Skin Impairment Nursing Diagnoses: Impaired tissue integrity Goals: Ulcer/skin breakdown will have a volume reduction of 30% by week 4 Date Initiated: 11/22/2018 Target Resolution Date: 12/21/2018 Goal Status: Active Interventions: Assess ulceration(s) every visit Notes: Electronic Signature(s) Signed: 12/20/2018 4:39:17 PM By: Army Melia Entered By: Army Melia on 12/20/2018 13:48:11 [place], [name] ([id number]) -------------------------------------------------------------------------------- Pain Assessment Details Patient Name: [name] Date of Service: 12/20/2018 1:30 PM Medical Record Number: [id number] Patient Account Number: [id number] Date of Birth/Sex: [birth year] (68 y.o. F) Treating RN: Army Melia Primary Care Tayo Maute: Harrel Lemon Other Clinician: Referring Jhada Risk: Harrel Lemon Treating Lillian Ballester/Extender: Melburn Hake, HOYT Weeks in Treatment: 4 Active Problems Location of Pain Severity and Description of Pain Patient Has Paino No Site Locations Pain Management and Medication Current Pain Management: Electronic Signature(s) Signed: 12/20/2018 4:39:17 PM By: Army Melia Signed: 12/20/2018 4:57:32 PM By: Lorine Bears RCP, RRT, CHT Entered By: Lorine Bears on 12/20/2018 13:28:46 [place] ([id number]) -------------------------------------------------------------------------------- Patient/Caregiver Education Details Patient Name: [name] Date of Service: 12/20/2018 1:30 PM Medical Record Number:  [id number] Patient Account Number: [id number] Date of Birth/Gender: [birth year] (68 y.o. F) Treating RN: Army Melia Primary Care Physician: Harrel Lemon Other Clinician: Referring Physician: Harrel Lemon Treating Physician/Extender: Sharalyn Ink in Treatment: 4 Education Assessment Education Provided To: Patient Education Topics Provided Wound/Skin Impairment: Handouts: Caring for Your Ulcer Methods: Demonstration, Explain/Verbal Responses: State content correctly Electronic Signature(s) Signed: 12/20/2018 4:39:17 PM By: Army Melia Entered By: Army Melia on 12/20/2018 13:55:38 [place], [name] ([id number]) -------------------------------------------------------------------------------- Wound Assessment Details Patient Name: [name] Date of Service: 12/20/2018 1:30 PM Medical Record Number: [id number] Patient Account Number: [id number] Date of Birth/Sex: February 10, 1951 (68 y.o. F) Treating RN: Harold Barban Primary Care Renan Danese: Harrel Lemon Other Clinician: Referring Louanna Vanliew: Harrel Lemon Treating Shahidah Nesbitt/Extender: Melburn Hake, HOYT Weeks in Treatment: 4 Wound Status Wound Number: 1 Primary Etiology: Incontinence Associated Dermatitis (IAD) Wound Location: Left Gluteal fold Secondary Etiology: Pressure Ulcer Wounding Event: Gradually Appeared Wound Status: Open Date Acquired: 10/16/2018 Comorbid History: Dementia, Neuropathy Weeks Of Treatment: 4 Clustered Wound: No Photos Wound Measurements Length: (cm) 0.7 Width: (cm) 0.5 Depth: (cm) 0.1 Area: (cm) 0.275 Volume: (cm) 0.027 % Reduction in Area: 87.7% % Reduction in Volume: 87.9% Epithelialization: None Tunneling: No Undermining: No Wound Description Full Thickness Without Exposed Support Foul Odo Classification: Structures Slough/F Wound Margin: Flat and  Intact Exudate Medium Amount: Exudate Type: Serous Exudate Color: amber r After Cleansing: No ibrino Yes Wound  Bed Granulation Amount: Large (67-100%) Exposed Structure Granulation Quality: Red Fascia Exposed: No Necrotic Amount: Small (1-33%) Fat Layer (Subcutaneous Tissue) Exposed: Yes Necrotic Quality: Adherent Slough Tendon Exposed: No Muscle Exposed: No Joint Exposed: No Bone Exposed: No Greaser, Marjani ([id number]) Treatment Notes Wound #1 (Left Gluteal fold) Notes silver cell, zinc periwound, BFD Electronic Signature(s) Signed: 12/20/2018 4:15:06 PM By: Harold Barban Entered By: Harold Barban on 12/20/2018 13:24:16 [name] ([id number]) -------------------------------------------------------------------------------- Wound Assessment Details Patient Name: [name] Date of Service: 12/20/2018 1:30 PM Medical Record Number: [id number] Patient Account Number: [id number] Date of Birth/Sex: 1950-11-23 (68 y.o. F) Treating RN: Harold Barban Primary Care Wallace Gappa: Harrel Lemon Other Clinician: Referring Naseem Varden: Harrel Lemon Treating Esti Demello/Extender: Melburn Hake, HOYT Weeks in Treatment: 4 Wound Status Wound Number: 2 Primary Etiology: Incontinence Associated Dermatitis (IAD) Wound Location: Right Gluteal fold Wound Status: Open Wounding Event: Gradually Appeared Comorbid History: Dementia, Neuropathy Date Acquired: 11/29/2018 Weeks Of Treatment: 3 Clustered Wound: No Photos Wound Measurements Length: (cm) 0.4 Width: (cm) 0.5 Depth: (cm) 0.1 Area: (cm) 0.157 Volume: (cm) 0.016 % Reduction in Area: 81.5% % Reduction in Volume: 81.2% Epithelialization: None Tunneling: No Undermining: No Wound Description Full Thickness Without Exposed Support Foul Odo Classification: Structures Slough/F Wound Margin: Flat and Intact Exudate Medium Amount: Exudate Type: Serous Exudate Color: amber r After Cleansing: No ibrino Yes Wound Bed Granulation Amount: Large (67-100%) Exposed Structure Granulation Quality: Pink Fascia Exposed: No Necrotic Amount:  Small (1-33%) Fat Layer (Subcutaneous Tissue) Exposed: Yes Necrotic Quality: Adherent Slough Tendon Exposed: No Muscle Exposed: No Joint Exposed: No Bone Exposed: No Paletta, Lura ([id number]) Treatment Notes Wound #2 (Right Gluteal fold) Notes silver cell, zinc periwound, BFD Electronic Signature(s) Signed: 12/20/2018 4:15:06 PM By: Harold Barban Entered By: Harold Barban on 12/20/2018 14:06:11 [place] ([id number]) -------------------------------------------------------------------------------- Wound Assessment Details Patient Name: [name] Date of Service: 12/20/2018 1:30 PM Medical Record Number: [id number] Patient Account Number: [id number] Date of Birth/Sex: 03-09-1950 (68 y.o. F) Treating RN: Harold Barban Primary Care Gilberte Gorley: Harrel Lemon Other Clinician: Referring Camaya Gannett: Harrel Lemon Treating Kirstyn Lean/Extender: Melburn Hake, HOYT Weeks in Treatment: 4 Wound Status Wound Number: 3 Primary Etiology: Incontinence Associated Dermatitis (IAD) Wound Location: Left Upper Leg - Medial Wound Status: Open Wounding Event: Gradually Appeared Comorbid History: Dementia, Neuropathy Date Acquired: 11/29/2018 Weeks Of Treatment: 3 Clustered Wound: No Photos Wound Measurements Length: (cm) 0.1 Width: (cm) 0.1 Depth: (cm) 0.1 Area: (cm) 0.008 Volume: (cm) 0.001 % Reduction in Area: 98.3% % Reduction in Volume: 97.9% Tunneling: No Undermining: No Wound Description Full Thickness Without Exposed Support Foul Odo Classification: Structures Slough/F Wound Margin: Flat and Intact Exudate Medium Amount: Exudate Type: Serous Exudate Color: amber r After Cleansing: No ibrino Yes Wound Bed Granulation Amount: Large (67-100%) Exposed Structure Granulation Quality: Red, Pink Fascia Exposed: No Necrotic Amount: None Present (0%) Fat Layer (Subcutaneous Tissue) Exposed: Yes Tendon Exposed: No Muscle Exposed: No Joint Exposed: No Bone Exposed:  No Schloemer, Xan ([id number]) Treatment Notes Wound #3 (Left, Medial Upper Leg) Notes silver cell, zinc periwound, BFD Electronic Signature(s) Signed: 12/20/2018 4:15:06 PM By: Harold Barban Entered By: Harold Barban on 12/20/2018 14:06:32 [name] ([id number]) -------------------------------------------------------------------------------- Vitals Details Patient Name: [name] Date of Service: 12/20/2018 1:30 PM Medical Record Number: [id number] Patient Account Number: [id number] Date of Birth/Sex: 11-25-1950 (68 y.o. F) Treating RN: Army Melia Primary Care Pearl Bents: Harrel Lemon Other Clinician: Referring Beckett Maden: Harrel Lemon Treating Latrish Mogel/Extender:  STONE III, HOYT Weeks in Treatment: 4 Vital Signs Time Taken: 13:25 Temperature (F): 97.7 Height (in): 59 Pulse (bpm): 61 Weight (lbs): 193 Respiratory Rate (breaths/min): 16 Body Mass Index (BMI): 39 Blood Pressure (mmHg): 114/80 Reference Range: 80 - 120 mg / dl Electronic Signature(s) Signed: 12/20/2018 4:57:32 PM By: Lorine Bears RCP, RRT, CHT Entered By: Lorine Bears on 12/20/2018 13:29:09

## 2019-01-03 ENCOUNTER — Encounter: Payer: Medicare Other | Admitting: Physician Assistant

## 2019-01-03 ENCOUNTER — Other Ambulatory Visit: Payer: Self-pay

## 2019-01-03 DIAGNOSIS — L2489 Irritant contact dermatitis due to other agents: Secondary | ICD-10-CM | POA: Diagnosis not present

## 2019-01-03 NOTE — Progress Notes (Addendum)
DANDREA, MEDDERS ([id number]) Visit Report for 01/03/2019 Chief Complaint Document Details Patient Name: [name], [name] Date of Service: 01/03/2019 10:45 AM Medical Record Number: [id number] Patient Account Number: [id number] Date of Birth/Sex: 01-03-1951 (68 y.o. F) Treating RN: Army Melia Primary Care Provider: Harrel Lemon Other Clinician: Referring Provider: Harrel Lemon Treating Provider/Extender: Melburn Hake, HOYT Weeks in Treatment: 6 Information Obtained from: Patient Chief Complaint Left gluteal fold ulcer Electronic Signature(s) Signed: 01/03/2019 10:39:28 AM By: Worthy Keeler PA-C Entered By: Worthy Keeler on 01/03/2019 10:39:28 [name], [name] ([id number]) -------------------------------------------------------------------------------- HPI Details Patient Name: [name] Date of Service: 01/03/2019 10:45 AM Medical Record Number: [id number] Patient Account Number: [id number] Date of Birth/Sex: 1950-05-20 (68 y.o. F) Treating RN: Army Melia Primary Care Provider: Harrel Lemon Other Clinician: Referring Provider: Harrel Lemon Treating Provider/Extender: Melburn Hake, HOYT Weeks in Treatment: 6 History of Present Illness HPI Description: 11/22/2018 patient presents today for initial evaluation in our clinic concerning issues that she has been having with a wound in the left gluteal fold. This has been present since August and was noted initially when she was in the hospital. Fortunately there is no signs of active infection at this time. No fever chills noted. Up to this point DuoDERM has been utilized although I am not sure that is the best treatment option for her based on what I am seeing currently. This actually started as a result of moisture associated skin damage secondary to a C. difficile infection. The patient does have dementia. She is also status post having had a kidney transplant and does have hypertension as well. She does spend a lot of her  time during the day sitting which is probably a part of why this is having trouble healing although it is not really a pressure ulcer that may be contributing to the lack of significant and rapid improvement. She still is having diarrhea as well and is still on treatment for the C. difficile infection she is on Dificid. They have been using barrier cream in the gluteal region to try to help protect the area where the dressing was not. 11/29/2018 upon evaluation today patient does not appear to be doing significantly better at this time. She still had some somewhat softer stools but not really as much diarrhea as what she had when the C. difficile infection was not full cycle. Overall things seem to be doing better in that regard although she does have 2 new areas on evaluation today. There is no signs of active infection. No fevers, chills, nausea, vomiting, or diarrhea. 12/06/2018 on evaluation today patient actually appears to be doing better with regard to the wounds in the gluteal region in general. Overall the biggest issue has been that apparently one of the wounds was missed by the home health nurse and not noted to be open and its been getting stuck to her brief. Nonetheless other than that and even with that considered she still seems to actually be doing quite well as far as I am seeing today. 12/20/2018 on evaluation today patient appears to be doing very well in regard to her wounds. Fortunately there is no sign of active infection at this time which is great news. She has been tolerating the dressing changes without complication although advanced home care is no longer coming out they just discontinued the patient's treatment all of a sudden standing that they were complete as far as what they could do. Nonetheless unfortunately the patient still has wounds open and has not had any  dressings to put on the area they have been doing the best they could until they came in here today. There  is no evidence of infection and things seem to be doing quite well. 01/03/2019 on evaluation today patient appears to be doing okay in some regards regarding her wounds. Specifically the main wound on the left gluteal fold in the right gluteal fold seems to be doing much better. The right gluteal fold in fact is pretty much healed. On the left this still has a little bit deeper area open at this point I think that still needs a bandage. Unfortunately she got different bandages than the silicone, Allevyn, bandages that we had recommended and ordered for her. For that reason she unfortunately does have some additional breakdown around where the bandages have been pulling at this time. This has caused several areas of skin breakdown unfortunately. Fortunately there is no signs of active infection at this time. No fevers, chills, nausea, vomiting, or diarrhea. Electronic Signature(s) Signed: 01/03/2019 2:59:27 PM By: Worthy Keeler PA-C Entered By: Worthy Keeler on 01/03/2019 14:59:27 [name], [name] ([id number]) -------------------------------------------------------------------------------- Physical Exam Details Patient Name: [name] Date of Service: 01/03/2019 10:45 AM Medical Record Number: [id number] Patient Account Number: [id number] Date of Birth/Sex: 12-09-1950 (68 y.o. F) Treating RN: Army Melia Primary Care Provider: Harrel Lemon Other Clinician: Referring Provider: Harrel Lemon Treating Provider/Extender: Melburn Hake, HOYT Weeks in Treatment: 6 Constitutional Well-nourished and well-hydrated in no acute distress. Respiratory normal breathing without difficulty. clear to auscultation bilaterally. Cardiovascular regular rate and rhythm with normal S1, S2. Psychiatric Patient is not able to cooperate in decision making regarding care. Patient has dementia. patient is confused. Notes Upon inspection today patient's wound bed actually showed signs of again healing on  the right the left is still open and not quite as well as far as how it is doing. Fortunately there is no signs of active infection at this time. No fevers, chills, nausea, vomiting, or diarrhea. Electronic Signature(s) Signed: 01/03/2019 3:00:05 PM By: Worthy Keeler PA-C Entered By: Worthy Keeler on 01/03/2019 15:00:05 [name] ([id number]) -------------------------------------------------------------------------------- Physician Orders Details Patient Name: [name] Date of Service: 01/03/2019 10:45 AM Medical Record Number: [id number] Patient Account Number: [id number] Date of Birth/Sex: 01/20/1951 (68 y.o. F) Treating RN: Army Melia Primary Care Provider: Harrel Lemon Other Clinician: Referring Provider: Harrel Lemon Treating Provider/Extender: Melburn Hake, HOYT Weeks in Treatment: 6 Verbal / Phone Orders: No Diagnosis Coding ICD-10 Coding Code Description L24.89 Irritant contact dermatitis due to other agents F03.90 Unspecified dementia without behavioral disturbance Z94.0 Kidney transplant status I10 Essential (primary) hypertension Wound Cleansing Wound #1 Left Gluteal fold o Clean wound with Normal Saline. - in office o Cleanse wound with mild soap and water Wound #2 Right Gluteal fold o Clean wound with Normal Saline. - in office o Cleanse wound with mild soap and water Wound #3 Right,Medial Upper Leg o Clean wound with Normal Saline. - in office o Cleanse wound with mild soap and water Wound #4 Left Upper Leg o Clean wound with Normal Saline. - in office o Cleanse wound with mild soap and water Wound #5 Left Gluteus o Clean wound with Normal Saline. - in office o Cleanse wound with mild soap and water Skin Barriers/Peri-Wound Care Wound #1 Left Gluteal fold o Skin Prep - periwound Wound #2 Right Gluteal fold o Skin Prep - periwound Wound #3 Right,Medial Upper Leg o Skin Prep - periwound Wound #4 Left Upper  Leg o Skin Prep -  periwound Wound #5 Left Gluteus o Skin Prep - periwound [name], [name] ([id number]) Primary Wound Dressing o Silver Alginate Wound #2 Right Gluteal fold o Other: - Zinc or desitin Wound #3 Right,Medial Upper Leg o Other: - Zinc or desitin Wound #4 Left Upper Leg o Other: - Zinc or desitin Wound #5 Left Gluteus o Other: - Zinc or desitin Secondary Dressing Wound #1 Left Gluteal fold o Boardered Foam Dressing Dressing Change Frequency Wound #1 Left Gluteal fold o Change dressing every other day. - as needed for soilage Wound #2 Right Gluteal fold o Change dressing every other day. - as needed for soilage Wound #3 Right,Medial Upper Leg o Change dressing every other day. - as needed for soilage Wound #4 Left Upper Leg o Change dressing every other day. - as needed for soilage Wound #5 Left Gluteus o Change dressing every other day. - as needed for soilage Follow-up Appointments Wound #1 Left Gluteal fold o Return Appointment in 2 weeks. Wound #2 Right Gluteal fold o Return Appointment in 2 weeks. Wound #3 Right,Medial Upper Leg o Return Appointment in 2 weeks. Wound #4 Left Upper Leg o Return Appointment in 2 weeks. Wound #5 Left Gluteus o Return Appointment in 2 weeks. Off-Loading Wound #1 Left Gluteal fold o Turn and reposition every 2 hours o Other: - memory foam cushion while siting [name], [name] ([id number]) Wound #2 Right Gluteal fold o Turn and reposition every 2 hours o Other: - memory foam cushion while siting Wound #3 Right,Medial Upper Leg o Turn and reposition every 2 hours o Other: - memory foam cushion while siting Wound #4 Left Upper Leg o Turn and reposition every 2 hours o Other: - memory foam cushion while siting Wound #5 Left Gluteus o Turn and reposition every 2 hours o Other: - memory foam cushion while Sports administrator Signature(s) Signed: 01/03/2019 12:07:34 PM  By: Army Melia Signed: 01/04/2019 1:53:04 PM By: Worthy Keeler PA-C Entered By: Army Melia on 01/03/2019 12:07:33 [name] ([id number]) -------------------------------------------------------------------------------- Problem List Details Patient Name: [name] Date of Service: 01/03/2019 10:45 AM Medical Record Number: [id number] Patient Account Number: [id number] Date of Birth/Sex: 1951/01/14 (68 y.o. F) Treating RN: Army Melia Primary Care Provider: Harrel Lemon Other Clinician: Referring Provider: Harrel Lemon Treating Provider/Extender: Melburn Hake, HOYT Weeks in Treatment: 6 Active Problems ICD-10 Evaluated Encounter Code Description Active Date Today Diagnosis L24.89 Irritant contact dermatitis due to other agents 11/22/2018 No Yes F03.90 Unspecified dementia without behavioral disturbance 11/22/2018 No Yes Z94.0 Kidney transplant status 11/22/2018 No Yes I10 Essential (primary) hypertension 11/22/2018 No Yes Inactive Problems Resolved Problems Electronic Signature(s) Signed: 01/03/2019 10:39:22 AM By: Worthy Keeler PA-C Entered By: Worthy Keeler on 01/03/2019 10:39:22 [place] ([id number]) -------------------------------------------------------------------------------- Progress Note Details Patient Name: [name] Date of Service: 01/03/2019 10:45 AM Medical Record Number: [id number] Patient Account Number: [id number] Date of Birth/Sex: July 23, 1950 (68 y.o. F) Treating RN: Army Melia Primary Care Provider: Harrel Lemon Other Clinician: Referring Provider: Harrel Lemon Treating Provider/Extender: Melburn Hake, HOYT Weeks in Treatment: 6 Subjective Chief Complaint Information obtained from Patient Left gluteal fold ulcer History of Present Illness (HPI) 11/22/2018 patient presents today for initial evaluation in our clinic concerning issues that she has been having with a wound in the left gluteal fold. This has been present  since August and was noted initially when she was in the hospital. Fortunately there is no signs of active infection at this time. No fever chills noted. Up to this point DuoDERM has been  utilized although I am not sure that is the best treatment option for her based on what I am seeing currently. This actually started as a result of moisture associated skin damage secondary to a C. difficile infection. The patient does have dementia. She is also status post having had a kidney transplant and does have hypertension as well. She does spend a lot of her time during the day sitting which is probably a part of why this is having trouble healing although it is not really a pressure ulcer that may be contributing to the lack of significant and rapid improvement. She still is having diarrhea as well and is still on treatment for the C. difficile infection she is on Dificid. They have been using barrier cream in the gluteal region to try to help protect the area where the dressing was not. 11/29/2018 upon evaluation today patient does not appear to be doing significantly better at this time. She still had some somewhat softer stools but not really as much diarrhea as what she had when the C. difficile infection was not full cycle. Overall things seem to be doing better in that regard although she does have 2 new areas on evaluation today. There is no signs of active infection. No fevers, chills, nausea, vomiting, or diarrhea. 12/06/2018 on evaluation today patient actually appears to be doing better with regard to the wounds in the gluteal region in general. Overall the biggest issue has been that apparently one of the wounds was missed by the home health nurse and not noted to be open and its been getting stuck to her brief. Nonetheless other than that and even with that considered she still seems to actually be doing quite well as far as I am seeing today. 12/20/2018 on evaluation today patient appears to  be doing very well in regard to her wounds. Fortunately there is no sign of active infection at this time which is great news. She has been tolerating the dressing changes without complication although advanced home care is no longer coming out they just discontinued the patient's treatment all of a sudden standing that they were complete as far as what they could do. Nonetheless unfortunately the patient still has wounds open and has not had any dressings to put on the area they have been doing the best they could until they came in here today. There is no evidence of infection and things seem to be doing quite well. 01/03/2019 on evaluation today patient appears to be doing okay in some regards regarding her wounds. Specifically the main wound on the left gluteal fold in the right gluteal fold seems to be doing much better. The right gluteal fold in fact is pretty much healed. On the left this still has a little bit deeper area open at this point I think that still needs a bandage. Unfortunately she got different bandages than the silicone, Allevyn, bandages that we had recommended and ordered for her. For that reason she unfortunately does have some additional breakdown around where the bandages have been pulling at this time. This has caused several areas of skin breakdown unfortunately. Fortunately there is no signs of active infection at this time. No fevers, chills, nausea, vomiting, or diarrhea. Patient History Unable to Obtain Patient History due to Dementia. Information obtained from Patient. [name], [name] ([id number]) Family History Heart Disease - Siblings, No family history of Cancer, Diabetes, Hypertension, Kidney Disease, Lung Disease, Seizures, Stroke, Thyroid Problems, Tuberculosis. Social History Former smoker - Quit 40+  years ago, Marital Status - Married, Alcohol Use - Never, Drug Use - No History, Caffeine Use - Rarely. Medical History Endocrine Denies history of Type  I Diabetes, Type II Diabetes Neurologic Patient has history of Dementia, Neuropathy - hands Oncologic Denies history of Received Chemotherapy, Received Radiation Hospitalization/Surgery History - [facility] UTI. Medical And Surgical History Notes Genitourinary Kidney Transpant 5+ years ago Review of Systems (ROS) Constitutional Symptoms (General Health) Denies complaints or symptoms of Fatigue, Fever, Chills, Marked Weight Change. Respiratory Denies complaints or symptoms of Chronic or frequent coughs, Shortness of Breath. Cardiovascular Denies complaints or symptoms of Chest pain, LE edema. Psychiatric Denies complaints or symptoms of Anxiety, Claustrophobia. Objective Constitutional Well-nourished and well-hydrated in no acute distress. Vitals Time Taken: 11:01 AM, Height: 59 in, Weight: 193 lbs, BMI: 39, Temperature: 98.2 F, Pulse: 66 bpm, Respiratory Rate: 16 breaths/min, Blood Pressure: 150/68 mmHg. Respiratory normal breathing without difficulty. clear to auscultation bilaterally. Cardiovascular regular rate and rhythm with normal S1, S2. Psychiatric Patient is not able to cooperate in decision making regarding care. Patient has dementia. patient is confused. [name], [name] ([id number]) General Notes: Upon inspection today patient's wound bed actually showed signs of again healing on the right the left is still open and not quite as well as far as how it is doing. Fortunately there is no signs of active infection at this time. No fevers, chills, nausea, vomiting, or diarrhea. Integumentary (Hair, Skin) Wound #1 status is Open. Original cause of wound was Gradually Appeared. The wound is located on the Left Gluteal fold. The wound measures 0.2cm length x 0.3cm width x 0.1cm depth; 0.047cm^2 area and 0.005cm^3 volume. Wound #2 status is Open. Original cause of wound was Gradually Appeared. The wound is located on the Right Gluteal fold. The wound measures 0.7cm length x 0.5cm  width x 0.1cm depth; 0.275cm^2 area and 0.027cm^3 volume. Wound #3 status is Open. Original cause of wound was Gradually Appeared. The wound is located on the Right,Medial Upper Leg. The wound measures 7cm length x 5cm width x 0.2cm depth; 27.489cm^2 area and 5.498cm^3 volume. Wound #4 status is Open. Original cause of wound was Gradually Appeared. The wound is located on the Left Upper Leg. The wound measures 4cm length x 0.4cm width x 0.1cm depth; 1.257cm^2 area and 0.126cm^3 volume. Wound #5 status is Open. Original cause of wound was Gradually Appeared. The wound is located on the Left Gluteus. The wound measures 0.6cm length x 0.5cm width x 0.1cm depth; 0.236cm^2 area and 0.024cm^3 volume. Assessment Active Problems ICD-10 Irritant contact dermatitis due to other agents Unspecified dementia without behavioral disturbance Kidney transplant status Essential (primary) hypertension Plan Wound Cleansing: Wound #1 Left Gluteal fold: Clean wound with Normal Saline. - in office Cleanse wound with mild soap and water Wound #2 Right Gluteal fold: Clean wound with Normal Saline. - in office Cleanse wound with mild soap and water Wound #3 Right,Medial Upper Leg: Clean wound with Normal Saline. - in office Cleanse wound with mild soap and water Wound #4 Left Upper Leg: Clean wound with Normal Saline. - in office Cleanse wound with mild soap and water Wound #5 Left Gluteus: Clean wound with Normal Saline. - in office Cleanse wound with mild soap and water [place], [name] ([id number]) Skin Barriers/Peri-Wound Care: Wound #1 Left Gluteal fold: Skin Prep - periwound Wound #2 Right Gluteal fold: Skin Prep - periwound Wound #3 Right,Medial Upper Leg: Skin Prep - periwound Wound #4 Left Upper Leg: Skin Prep - periwound Wound #5 Left Gluteus:  Skin Prep - periwound Primary Wound Dressing: Silver Alginate Wound #2 Right Gluteal fold: Other: - Zinc or desitin Wound #3 Right,Medial Upper  Leg: Other: - Zinc or desitin Wound #4 Left Upper Leg: Other: - Zinc or desitin Wound #5 Left Gluteus: Other: - Zinc or desitin Secondary Dressing: Wound #1 Left Gluteal fold: Boardered Foam Dressing Dressing Change Frequency: Wound #1 Left Gluteal fold: Change dressing every other day. - as needed for soilage Wound #2 Right Gluteal fold: Change dressing every other day. - as needed for soilage Wound #3 Right,Medial Upper Leg: Change dressing every other day. - as needed for soilage Wound #4 Left Upper Leg: Change dressing every other day. - as needed for soilage Wound #5 Left Gluteus: Change dressing every other day. - as needed for soilage Follow-up Appointments: Wound #1 Left Gluteal fold: Return Appointment in 2 weeks. Wound #2 Right Gluteal fold: Return Appointment in 2 weeks. Wound #3 Right,Medial Upper Leg: Return Appointment in 2 weeks. Wound #4 Left Upper Leg: Return Appointment in 2 weeks. Wound #5 Left Gluteus: Return Appointment in 2 weeks. Off-Loading: Wound #1 Left Gluteal fold: Turn and reposition every 2 hours Other: - memory foam cushion while siting Wound #2 Right Gluteal fold: Turn and reposition every 2 hours Other: - memory foam cushion while siting Wound #3 Right,Medial Upper Leg: Turn and reposition every 2 hours Other: - memory foam cushion while siting Wound #4 Left Upper Leg: Turn and reposition every 2 hours Other: - memory foam cushion while siting Wound #5 Left Gluteus: [place], Yanis ([id number]) Turn and reposition every 2 hours Other: - memory foam cushion while siting 1. My suggestion at this time is good to be that we continue the silver alginate for the left gluteal fold region at this point I feel like that is doing well at this time. Subsequently we will continue to cover this with a border foam dressing I would recommend Allevyn only no other adhesive's. 2. With regard to all the other open areas I would recommend at this time  that we go ahead and initiate treatment with zinc oxide barrier cream to be used on a regular basis to protect these regions this can be applied twice a day possibly even more often if need be. 3. I recommend the patient continue to try to keep pressure off of the areas as much as possible she does have a caregiver or family member with her at all times. We will see patient back for reevaluation in 2 weeks here in the clinic. If anything worsens or changes patient will contact our office for additional recommendations. Electronic Signature(s) Signed: 01/03/2019 3:01:04 PM By: Worthy Keeler PA-C Entered By: Worthy Keeler on 01/03/2019 15:01:04 [place] ([id number]) -------------------------------------------------------------------------------- ROS/PFSH Details Patient Name: [name] Date of Service: 01/03/2019 10:45 AM Medical Record Number: [id number] Patient Account Number: [id number] Date of Birth/Sex: [birth year] (68 y.o. F) Treating RN: Army Melia Primary Care Provider: Harrel Lemon Other Clinician: Referring Provider: Harrel Lemon Treating Provider/Extender: Melburn Hake, HOYT Weeks in Treatment: 6 Unable to Obtain Patient History due to oo Dementia Information Obtained From Patient Constitutional Symptoms (General Health) Complaints and Symptoms: Negative for: Fatigue; Fever; Chills; Marked Weight Change Respiratory Complaints and Symptoms: Negative for: Chronic or frequent coughs; Shortness of Breath Cardiovascular Complaints and Symptoms: Negative for: Chest pain; LE edema Psychiatric Complaints and Symptoms: Negative for: Anxiety; Claustrophobia Endocrine Medical History: Negative for: Type I Diabetes; Type II Diabetes Genitourinary Medical History: Past Medical History Notes: Kidney Transpant  5+ years ago Neurologic Medical History: Positive for: Dementia; Neuropathy - hands Oncologic Medical History: Negative for: Received Chemotherapy;  Received Radiation Immunizations Pneumococcal Vaccine: Received Pneumococcal Vaccination: Yes [name], [name] ([id number]) Implantable Devices Yes Hospitalization / Surgery History Type of Hospitalization/Surgery [facility] UTI Family and Social History Cancer: No; Diabetes: No; Heart Disease: Yes - Siblings; Hypertension: No; Kidney Disease: No; Lung Disease: No; Seizures: No; Stroke: No; Thyroid Problems: No; Tuberculosis: No; Former smoker - Quit 40+ years ago; Marital Status - Married; Alcohol Use: Never; Drug Use: No History; Caffeine Use: Rarely Physician Affirmation I have reviewed and agree with the above information. Electronic Signature(s) Signed: 01/04/2019 8:45:19 AM By: Army Melia Signed: 01/04/2019 1:53:04 PM By: Worthy Keeler PA-C Entered By: Worthy Keeler on 01/03/2019 14:59:45 [name], [name] ([id number]) -------------------------------------------------------------------------------- SuperBill Details Patient Name: [name] Date of Service: 01/03/2019 Medical Record Number: [id number] Patient Account Number: [id number] Date of Birth/Sex: 05-01-1950 (68 y.o. 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 I10 Essential (primary) hypertension Facility Procedures CPT4 Code: [id number] Description: [phone] - WOUND CARE VISIT-LEV 5 EST PT Modifier: Quantity: 1 Physician Procedures CPT4 Code: 3151761 Description: 60737 - WC PHYS LEVEL 4 - EST PT ICD-10 Diagnosis Description L24.89 Irritant contact dermatitis due to other agents F03.90 Unspecified dementia without behavioral disturbance 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

## 2019-01-03 NOTE — Progress Notes (Addendum)
NATAUSHA, JUNGWIRTH ([id number]) Visit Report for 01/03/2019 Arrival Information Details Patient Name: [name], [name] Date of Service: 01/03/2019 10:45 AM Medical Record Number: [id number] Patient Account Number: [id number] Date of Birth/Sex: 05/28/1950 (68 y.o. F) Treating RN: Cornell Barman Primary Care Thomas Rhude: Harrel Lemon Other Clinician: Referring Ellenie Salome: Harrel Lemon Treating Deverick Pruss/Extender: Melburn Hake, HOYT Weeks in Treatment: 6 Visit Information History Since Last Visit Added or deleted any medications: No Patient Arrived: Wheel Chair Any new allergies or adverse reactions: No Arrival Time: 11:00 Had a fall or experienced change in No Accompanied By: caregiver activities of daily living that may affect Transfer Assistance: Manual risk of falls: Patient Identification Verified: Yes Signs or symptoms of abuse/neglect since last visito No Secondary Verification Process Completed: Yes Hospitalized since last visit: No Patient Requires Transmission-Based No Implantable device outside of the clinic excluding No Precautions: cellular tissue based products placed in the center Patient Has Alerts: Yes since last visit: Patient Alerts: NOT Pain Present Now: No Diabetic Electronic Signature(s) Signed: 01/03/2019 1:15:06 PM By: Gretta Cool, BSN, RN, CWS, Kim RN, BSN Entered By: Gretta Cool, BSN, RN, CWS, Kim on 01/03/2019 11:00:51 [name] ([id number]) -------------------------------------------------------------------------------- Clinic Level of Care Assessment Details Patient Name: [name] Date of Service: 01/03/2019 10:45 AM Medical Record Number: [id number] Patient Account Number: [id number] Date of Birth/Sex: 03-28-1950 (68 y.o. F) Treating RN: Army Melia Primary Care Laylanie Kruczek: Harrel Lemon Other Clinician: Referring Payge Eppes: Harrel Lemon Treating Jla Reynolds/Extender: Melburn Hake, HOYT Weeks in Treatment: 6 Clinic Level of Care Assessment Items TOOL 4  Quantity Score []  - Use when only an EandM is performed on FOLLOW-UP visit 0 ASSESSMENTS - Nursing Assessment / Reassessment X - Reassessment of Co-morbidities (includes updates in patient status) 1 10 X- 1 5 Reassessment of Adherence to Treatment Plan ASSESSMENTS - Wound and Skin Assessment / Reassessment []  - Simple Wound Assessment / Reassessment - one wound 0 X- 5 5 Complex Wound Assessment / Reassessment - multiple wounds []  - 0 Dermatologic / Skin Assessment (not related to wound area) ASSESSMENTS - Focused Assessment []  - Circumferential Edema Measurements - multi extremities 0 []  - 0 Nutritional Assessment / Counseling / Intervention []  - 0 Lower Extremity Assessment (monofilament, tuning fork, pulses) []  - 0 Peripheral Arterial Disease Assessment (using hand held doppler) ASSESSMENTS - Ostomy and/or Continence Assessment and Care []  - Incontinence Assessment and Management 0 []  - 0 Ostomy Care Assessment and Management (repouching, etc.) PROCESS - Coordination of Care X - Simple Patient / Family Education for ongoing care 1 15 []  - 0 Complex (extensive) Patient / Family Education for ongoing care []  - 0 Staff obtains Programmer, systems, Records, Test Results / Process Orders []  - 0 Staff telephones HHA, Nursing Homes / Clarify orders / etc []  - 0 Routine Transfer to another Facility (non-emergent condition) []  - 0 Routine Hospital Admission (non-emergent condition) []  - 0 New Admissions / Biomedical engineer / Ordering NPWT, Apligraf, etc. []  - 0 Emergency Hospital Admission (emergent condition) X- 1 10 Simple Discharge Coordination [name], [name] ([id number]) []  - 0 Complex (extensive) Discharge Coordination PROCESS - Special Needs []  - Pediatric / Minor Patient Management 0 []  - 0 Isolation Patient Management []  - 0 Hearing / Language / Visual special needs []  - 0 Assessment of Community assistance (transportation, D/C planning, etc.) []  - 0 Additional  assistance / Altered mentation []  - 0 Support Surface(s) Assessment (bed, cushion, seat, etc.) INTERVENTIONS - Wound Cleansing / Measurement []  - Simple Wound Cleansing - one wound 0 X- 5 5 Complex Wound Cleansing -  multiple wounds X- 1 5 Wound Imaging (photographs - any number of wounds) []  - 0 Wound Tracing (instead of photographs) []  - 0 Simple Wound Measurement - one wound X- 5 5 Complex Wound Measurement - multiple wounds INTERVENTIONS - Wound Dressings []  - Small Wound Dressing one or multiple wounds 0 X- 5 15 Medium Wound Dressing one or multiple wounds []  - 0 Large Wound Dressing one or multiple wounds []  - 0 Application of Medications - topical []  - 0 Application of Medications - injection INTERVENTIONS - Miscellaneous []  - External ear exam 0 []  - 0 Specimen Collection (cultures, biopsies, blood, body fluids, etc.) []  - 0 Specimen(s) / Culture(s) sent or taken to Lab for analysis []  - 0 Patient Transfer (multiple staff / Civil Service fast streamer / Similar devices) []  - 0 Simple Staple / Suture removal (25 or less) []  - 0 Complex Staple / Suture removal (26 or more) []  - 0 Hypo / Hyperglycemic Management (close monitor of Blood Glucose) []  - 0 Ankle / Brachial Index (ABI) - do not check if billed separately X- 1 5 Vital Signs [name], [name] ([id number]) Has the patient been seen at the hospital within the last three years: Yes Total Score: 200 Level Of Care: New/Established - Level 5 Electronic Signature(s) Signed: 01/04/2019 8:45:19 AM By: Army Melia Entered By: Army Melia on 01/03/2019 12:08:25 [name] ([id number]) -------------------------------------------------------------------------------- Encounter Discharge Information Details Patient Name: [name] Date of Service: 01/03/2019 10:45 AM Medical Record Number: [id number] Patient Account Number: [id number] Date of Birth/Sex: 01/06/1951 (68 y.o. F) Treating RN: Army Melia Primary Care  Conner Neiss: Harrel Lemon Other Clinician: Referring Jairen Goldfarb: Harrel Lemon Treating Quintessa Simmerman/Extender: Melburn Hake, HOYT Weeks in Treatment: 6 Encounter Discharge Information Items Discharge Condition: Stable Ambulatory Status: Wheelchair Discharge Destination: Home Transportation: Private Auto Accompanied By: caregiver Schedule Follow-up Appointment: Yes Clinical Summary of Care: Electronic Signature(s) Signed: 01/04/2019 8:45:19 AM By: Army Melia Entered By: Army Melia on 01/03/2019 11:49:13 [name] ([id number]) -------------------------------------------------------------------------------- Lower Extremity Assessment Details Patient Name: [name] Date of Service: 01/03/2019 10:45 AM Medical Record Number: [id number] Patient Account Number: [id number] Date of Birth/Sex: Nov 18, 1950 (68 y.o. F) Treating RN: Cornell Barman Primary Care Markhi Kleckner: Harrel Lemon Other Clinician: Referring Essynce Munsch: Harrel Lemon Treating Betty Daidone/Extender: Sharalyn Ink in Treatment: 6 Electronic Signature(s) Signed: 01/03/2019 1:15:06 PM By: Gretta Cool, BSN, RN, CWS, Kim RN, BSN Entered By: Gretta Cool, BSN, RN, CWS, Kim on 01/03/2019 11:15:32 [name], [name] ([id number]) -------------------------------------------------------------------------------- Multi Wound Chart Details Patient Name: [name] Date of Service: 01/03/2019 10:45 AM Medical Record Number: [id number] Patient Account Number: [id number] Date of Birth/Sex: 15-Jun-1950 (68 y.o. 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)] [2:Incontinence Associated Dermatitis (IAD)] [3:Incontinence Associated Dermatitis (IAD)] Secondary Etiology: [1:Pressure Ulcer] [2:N/A] [3:N/A] Date Acquired: [1:10/16/2018] [2:11/29/2018] [3:11/29/2018] Weeks of Treatment: [1:6] [2:5] [3:5] Wound Status: [1:Open] [2:Open] [3:Open] Measurements L x W x D [1:0.2x0.3x0.1] [2:0.7x0.5x0.1] [3:7x5x0.2] (cm) Area (cm) : [1:0.047] [2:0.275] [3:27.489] Volume (cm) : [1:0.005] [2:0.027] [3:5.498] % Reduction in Area: [1:97.90%] [2:67.60%] [3:-5736.30%] % Reduction in Volume: [1:97.80%] [2:68.20%] [3:-11597.90%] Classification: [1:Full Thickness Without Exposed Support Structures] [2:Full  Thickness Without Exposed Support Structures] [3:Full Thickness Without Exposed Support Structures] Treatment Notes Electronic Signature(s) Signed: 01/04/2019 8:45:19 AM By: Army Melia Entered By: Army Melia on 01/03/2019 11:48:17 [name], [name] ([id number]) -------------------------------------------------------------------------------- Multi-Disciplinary Care Plan Details Patient Name: [name] Date of Service: 01/03/2019 10:45 AM Medical Record Number: [id number] Patient Account Number: [id number] Date of Birth/Sex: [birth year] (68 y.o. F) Treating RN: Army Melia Primary Care Joell Usman: Harrel Lemon Other Clinician: Referring Imya Mance: Harrel Lemon Treating Theodus Ran/Extender: Melburn Hake, HOYT Weeks in Treatment: 6 Active Inactive Abuse / Safety / Falls / Self Care Management Nursing Diagnoses: Potential for falls Goals: Patient/caregiver will demonstrate safe use of adaptive devices to increase mobility Date Initiated: 11/22/2018 Target Resolution Date: 12/21/2018 Goal Status: Active Interventions: Assess: immobility, friction,  shearing, incontinence upon admission and as needed Notes: Orientation to the Wound Care Program Nursing Diagnoses: Knowledge deficit related to the wound healing center program Goals: Patient/caregiver will verbalize understanding of the [place] Program Date Initiated: 11/22/2018 Target Resolution Date: 12/21/2018 Goal Status: Active Interventions: Provide education on orientation to the wound center Notes: Pressure Nursing Diagnoses: Knowledge deficit related to management of pressures ulcers Goals: Patient/caregiver will verbalize risk factors for pressure ulcer development Date Initiated: 11/22/2018 Target Resolution Date: 12/21/2018 Goal Status: Active Interventions: Assess: immobility, friction, shearing, incontinence upon admission and as needed LASTACIA, SOLUM ([id number]) Notes: Wound/Skin Impairment Nursing Diagnoses: Impaired tissue integrity Goals: Ulcer/skin breakdown will have a volume reduction of 30% by week 4 Date Initiated: 11/22/2018 Target Resolution Date: 12/21/2018 Goal Status: Active Interventions: Assess ulceration(s) every visit Notes: Electronic Signature(s) Signed: 01/04/2019 8:45:19 AM By: Army Melia Entered By: Army Melia on 01/03/2019 11:48:08 [name] ([id number]) -------------------------------------------------------------------------------- Pain Assessment Details Patient Name: [name] Date of Service: 01/03/2019 10:45 AM Medical Record Number: [id number] Patient Account Number: [id number] Date of Birth/Sex: January 09, 1951 (68 y.o. F) Treating RN: Cornell Barman Primary Care Saeed Toren: Harrel Lemon Other Clinician: Referring Danyale Ridinger: Harrel Lemon Treating Tallie Dodds/Extender: Melburn Hake, HOYT Weeks in Treatment: 6 Active Problems Location of Pain Severity and Description of Pain Patient Has Paino Patient Unable to Respond Site Locations Pain Management and Medication Current Pain Management: Goals for Pain  Management Patient states no to pain, but grimises when moving in the chair. Electronic Signature(s) Signed: 01/03/2019 1:15:06 PM By: Gretta Cool, BSN, RN, CWS, Kim RN, BSN Entered By: Gretta Cool, BSN, RN, CWS, Kim on 01/03/2019 11:01:45 [name], [name] ([id number]) -------------------------------------------------------------------------------- Patient/Caregiver Education Details Patient Name: [name] Date of Service: 01/03/2019 10:45 AM Medical Record Number: [id number] Patient Account Number: [id number] Date of Birth/Gender: 1951/01/07 (68 y.o. F) Treating RN: Army Melia Primary Care Physician: Harrel Lemon Other Clinician: Referring Physician: Harrel Lemon Treating Physician/Extender: Sharalyn Ink in Treatment: 6 Education Assessment Education Provided To: Patient Education Topics Provided Wound/Skin Impairment: Handouts: Caring for Your Ulcer Methods: Demonstration, Explain/Verbal Responses: State content correctly Electronic Signature(s) Signed: 01/04/2019 8:45:19 AM By: Army Melia Entered By: Army Melia on 01/03/2019 11:48:50 [name], [name] ([id number]) -------------------------------------------------------------------------------- Wound Assessment Details Patient Name: [name] Date of Service: 01/03/2019 10:45 AM Medical Record Number: [id number] Patient Account Number: [id number] Date of Birth/Sex: 1950-09-25 (68 y.o. F) Treating RN: Cornell Barman Primary Care Nashaun Hillmer: Harrel Lemon Other Clinician: Referring Marsel Gail: Harrel Lemon Treating Kimbella Heisler/Extender: Melburn Hake, HOYT Weeks in Treatment: 6 Wound Status Wound Number: 1 Primary Etiology: Incontinence Associated Dermatitis (IAD) Wound Location: Left Gluteal fold Secondary Etiology: Pressure Ulcer Wounding Event: Gradually Appeared Wound Status: Open Date Acquired: 10/16/2018 Weeks Of Treatment: 6 Clustered Wound: No Wound Measurements Length: (cm) 0.2 Width: (  cm) 0.3 Depth:  (cm) 0.1 Area: (cm) 0.047 Volume: (cm) 0.005 % Reduction in Area: 97.9% % Reduction in Volume: 97.8% Wound Description Full Thickness Without Exposed Support Classification: Structures Treatment Notes Wound #1 (Left Gluteal fold) Notes silver cell and BFD to left glut, Zinc to all other wounds Electronic Signature(s) Signed: 01/03/2019 1:15:06 PM By: Gretta Cool, BSN, RN, CWS, Kim RN, BSN Entered By: Gretta Cool, BSN, RN, CWS, Kim on 01/03/2019 11:14:30 [name] ([id number]) -------------------------------------------------------------------------------- Wound Assessment Details Patient Name: [name] Date of Service: 01/03/2019 10:45 AM Medical Record Number: [id number] Patient Account Number: [id number] Date of Birth/Sex: Sep 15, 1950 (68 y.o. F) Treating RN: Cornell Barman Primary Care Haidynn Almendarez: Harrel Lemon Other Clinician: Referring Annayah Worthley: Harrel Lemon Treating Earnestine Shipp/Extender: Melburn Hake, HOYT Weeks in Treatment: 6 Wound Status Wound Number: 2 Primary Etiology: Incontinence Associated Dermatitis (IAD) Wound Location: Right Gluteal fold Wound Status: Open Wounding Event: Gradually Appeared Date Acquired: 11/29/2018 Weeks Of Treatment: 5 Clustered Wound: No Photos Photo Uploaded By: Gretta Cool, BSN, RN, CWS, Kim on 01/03/2019 12:02:28 Wound Measurements Length: (cm) 0.7 Width: (cm) 0.5 Depth: (cm) 0.1 Area: (cm) 0.275 Volume: (cm) 0.027 % Reduction in Area: 67.6% % Reduction in Volume: 68.2% Wound Description Full Thickness Without Exposed Support Classification: Structures Treatment Notes Wound #2 (Right Gluteal fold) Notes silver cell and BFD to left glut, Zinc to all other wounds Electronic Signature(s) Signed: 01/03/2019 1:15:06 PM By: Gretta Cool, BSN, RN, CWS, Kim RN, BSN Entered By: Gretta Cool, BSN, RN, CWS, Kim on 01/03/2019 11:14:30 [name] ([id number]) -------------------------------------------------------------------------------- Wound  Assessment Details Patient Name: [name] Date of Service: 01/03/2019 10:45 AM Medical Record Number: [id number] Patient Account Number: [id number] Date of Birth/Sex: 02-20-1950 (68 y.o. F) Treating RN: Cornell Barman Primary Care Graysyn Bache: Harrel Lemon Other Clinician: Referring Solana Coggin: Harrel Lemon Treating Elany Felix/Extender: Melburn Hake, HOYT Weeks in Treatment: 6 Wound Status Wound Number: 3 Primary Etiology: Incontinence Associated Dermatitis (IAD) Wound Location: Left, Medial Upper Leg Wound Status: Open Wounding Event: Gradually Appeared Date Acquired: 11/29/2018 Weeks Of Treatment: 5 Clustered Wound: No Photos Photo Uploaded By: Gretta Cool, BSN, RN, CWS, Kim on 01/03/2019 12:02:29 Wound Measurements Length: (cm) 7 Width: (cm) 5 Depth: (cm) 0.2 Area: (cm) 27.489 Volume: (cm) 5.498 % Reduction in Area: -5736.3% % Reduction in Volume: -11597.9% Wound Description Full Thickness Without Exposed Support Classification: Structures Electronic Signature(s) Signed: 01/03/2019 1:15:06 PM By: Gretta Cool, BSN, RN, CWS, Kim RN, BSN Entered By: Gretta Cool, BSN, RN, CWS, Kim on 01/03/2019 11:14:31 [name] ([id number]) -------------------------------------------------------------------------------- Wound Assessment Details Patient Name: [name] Date of Service: 01/03/2019 10:45 AM Medical Record Number: [id number] Patient Account Number: [id number] Date of Birth/Sex: 1950/03/20 (68 y.o. F) Treating RN: Cornell Barman Primary Care Broughton Eppinger: Harrel Lemon Other Clinician: Referring Ramondo Dietze: Harrel Lemon Treating Messiyah Waterson/Extender: Melburn Hake, HOYT Weeks in Treatment: 6 Wound Status Wound Number: 4 Primary Etiology: MASD Wound Location: Right Upper Leg Wound Status: Open Wounding Event: Gradually Appeared Date Acquired: 12/31/2018 Weeks Of Treatment: 0 Clustered Wound: Yes Photos Photo Uploaded By: Gretta Cool, BSN, RN, CWS, Kim on 01/03/2019 12:03:10 Wound  Measurements Length: (cm) Width: (cm) Depth: (cm) Area: (cm) Volume: (cm) 4 % Reduction in Area: 0.4 % Reduction in Volume: 0.1 1.257 0.126 Electronic Signature(s) Signed: 01/03/2019 1:15:06 PM By: Gretta Cool, BSN, RN, CWS, Kim RN, BSN Entered By: Gretta Cool, BSN, RN, CWS, Kim on 01/03/2019 11:50:40 [name] ([id number]) -------------------------------------------------------------------------------- Wound Assessment Details Patient Name: [name] Date of Service: 01/03/2019 10:45 AM Medical Record Number: [id number] Patient Account Number: [id number] Date of Birth/Sex: December 16, 1950 (68 y.o. F) Treating  RN: Cornell Barman Primary Care Meleah Demeyer: Harrel Lemon Other Clinician: Referring Stylianos Stradling: Harrel Lemon Treating Linder Prajapati/Extender: Melburn Hake, HOYT Weeks in Treatment: 6 Wound Status Wound Number: 5 Primary Etiology: Trauma, Other Wound Location: Left Gluteus Wound Status: Open Wounding Event: Gradually Appeared Date Acquired: 12/24/2018 Weeks Of Treatment: 0 Clustered Wound: No Photos Photo Uploaded By: Gretta Cool, BSN, RN, CWS, Kim on 01/03/2019 12:03:11 Wound Measurements Length: (cm) Width: (cm) Depth: (cm) Area: (cm) Volume: (cm) 0.6 % Reduction in Area: 0.5 % Reduction in Volume: 0.1 0.236 0.024 Treatment Notes Wound #5 (Left Gluteus) Notes silver cell and BFD to left glut, Zinc to all other wounds Electronic Signature(s) Signed: 01/03/2019 1:15:06 PM By: Gretta Cool, BSN, RN, CWS, Kim RN, BSN Entered By: Gretta Cool, BSN, RN, CWS, Kim on 01/03/2019 11:50:41 [name] ([id number]) -------------------------------------------------------------------------------- [place] Details Patient Name: [name] Date of Service: 01/03/2019 10:45 AM Medical Record Number: [id number] Patient Account Number: [id number] Date of Birth/Sex: 1950-04-09 (68 y.o. F) Treating RN: Cornell Barman Primary Care Talisa Petrak: Harrel Lemon Other Clinician: Referring Gavriel Holzhauer:  Harrel Lemon Treating Traivon Morrical/Extender: Melburn Hake, HOYT Weeks in Treatment: 6 Vital Signs Time Taken: 11:01 Temperature (F): 98.2 Height (in): 59 Pulse (bpm): 66 Weight (lbs): 193 Respiratory Rate (breaths/min): 16 Body Mass Index (BMI): 39 Blood Pressure (mmHg): 150/68 Reference Range: 80 - 120 mg / dl Electronic Signature(s) Signed: 01/03/2019 1:15:06 PM By: Gretta Cool, BSN, RN, CWS, Kim RN, BSN Entered By: Gretta Cool, BSN, RN, CWS, Kim on 01/03/2019 11:05:34

## 2019-01-17 ENCOUNTER — Encounter: Payer: Medicare Other | Attending: Physician Assistant | Admitting: Physician Assistant

## 2019-01-17 ENCOUNTER — Other Ambulatory Visit: Payer: Self-pay

## 2019-01-17 DIAGNOSIS — Z8249 Family history of ischemic heart disease and other diseases of the circulatory system: Secondary | ICD-10-CM | POA: Diagnosis not present

## 2019-01-17 DIAGNOSIS — F0390 Unspecified dementia without behavioral disturbance: Secondary | ICD-10-CM | POA: Diagnosis not present

## 2019-01-17 DIAGNOSIS — Z87891 Personal history of nicotine dependence: Secondary | ICD-10-CM | POA: Insufficient documentation

## 2019-01-17 DIAGNOSIS — Z940 Kidney transplant status: Secondary | ICD-10-CM | POA: Insufficient documentation

## 2019-01-17 DIAGNOSIS — I10 Essential (primary) hypertension: Secondary | ICD-10-CM | POA: Diagnosis not present

## 2019-01-17 DIAGNOSIS — L2489 Irritant contact dermatitis due to other agents: Secondary | ICD-10-CM | POA: Diagnosis present

## 2019-01-17 NOTE — Progress Notes (Addendum)
MISAO, FACKRELL ([id number]) Visit Report for 01/17/2019 Chief Complaint Document Details Patient Name: [name], [name] Date of Service: 01/17/2019 10:15 AM Medical Record Number: [id number] Patient Account Number: [id number] Date of Birth/Sex: [birth year] (68 y.o. F) Treating RN: Army Melia Primary Care Provider: Harrel Lemon Other Clinician: Referring Provider: Harrel Lemon Treating Provider/Extender: Melburn Hake, HOYT Weeks in Treatment: 8 Information Obtained from: Patient Chief Complaint Left gluteal fold ulcer Electronic Signature(s) Signed: 01/17/2019 10:40:22 AM By: Worthy Keeler PA-C Entered By: Worthy Keeler on 01/17/2019 10:40:21 [place], [name] ([id number]) -------------------------------------------------------------------------------- HPI Details Patient Name: [name] Date of Service: 01/17/2019 10:15 AM Medical Record Number: [id number] Patient Account Number: [id number] Date of Birth/Sex: 04-09-1950 (68 y.o. F) Treating RN: Army Melia Primary Care Provider: Harrel Lemon Other Clinician: Referring Provider: Harrel Lemon Treating Provider/Extender: Melburn Hake, HOYT Weeks in Treatment: 8 History of Present Illness HPI Description: 11/22/2018 patient presents today for initial evaluation in our clinic concerning issues that she has been having with a wound in the left gluteal fold. This has been present since August and was noted initially when she was in the hospital. Fortunately there is no signs of active infection at this time. No fever chills noted. Up to this point DuoDERM has been utilized although I am not sure that is the best treatment option for her based on what I am seeing currently. This actually started as a result of moisture associated skin damage secondary to a C. difficile infection. The patient does have dementia. She is also status post having had a kidney transplant and does have hypertension as well. She does spend a lot of her time  during the day sitting which is probably a part of why this is having trouble healing although it is not really a pressure ulcer that may be contributing to the lack of significant and rapid improvement. She still is having diarrhea as well and is still on treatment for the C. difficile infection she is on Dificid. They have been using barrier cream in the gluteal region to try to help protect the area where the dressing was not. 11/29/2018 upon evaluation today patient does not appear to be doing significantly better at this time. She still had some somewhat softer stools but not really as much diarrhea as what she had when the C. difficile infection was not full cycle. Overall things seem to be doing better in that regard although she does have 2 new areas on evaluation today. There is no signs of active infection. No fevers, chills, nausea, vomiting, or diarrhea. 12/06/2018 on evaluation today patient actually appears to be doing better with regard to the wounds in the gluteal region in general. Overall the biggest issue has been that apparently one of the wounds was missed by the home health nurse and not noted to be open and its been getting stuck to her brief. Nonetheless other than that and even with that considered she still seems to actually be doing quite well as far as I am seeing today. 12/20/2018 on evaluation today patient appears to be doing very well in regard to her wounds. Fortunately there is no sign of active infection at this time which is great news. She has been tolerating the dressing changes without complication although [facility] home [facility] is no longer coming out they just discontinued the patient's treatment all of a sudden standing that they were complete as far as what they could do. Nonetheless unfortunately the patient still has wounds open and has not had any  dressings to put on the area they have been doing the best they could until they came in here today. There is no  evidence of infection and things seem to be doing quite well. 01/03/2019 on evaluation today patient appears to be doing okay in some regards regarding her wounds. Specifically the main wound on the left gluteal fold in the right gluteal fold seems to be doing much better. The right gluteal fold in fact is pretty much healed. On the left this still has a little bit deeper area open at this point I think that still needs a bandage. Unfortunately she got different bandages than the silicone, Allevyn, bandages that we had recommended and ordered for her. For that reason she unfortunately does have some additional breakdown around where the bandages have been pulling at this time. This has caused several areas of skin breakdown unfortunately. Fortunately there is no signs of active infection at this time. No fevers, chills, nausea, vomiting, or diarrhea. 01/17/2019 on evaluation today patient appears to be having a lot of issues with skin tearing due to adhesive's from the border foam dressing that was sent from the medical supplier. With that being said we specifically ordered Allevyn dressings I am not sure why they sent the other and less it has a insurance issue with not actually covering the dressings that they feel like that is better for her. With that being said there does not appear to be any signs of active infection at this point which is good news. Nonetheless she is still having pain. She is seen with a caregiver here today. Electronic Signature(s) Signed: 01/17/2019 1:19:40 PM By: Worthy Keeler PA-C Entered By: Worthy Keeler on 01/17/2019 13:19:40 [name] ([id number]) [name] ([id number]) -------------------------------------------------------------------------------- Physical Exam Details Patient Name: [name] Date of Service: 01/17/2019 10:15 AM Medical Record Number: [id number] Patient Account Number: [id number] Date of Birth/Sex: [birth year] (68 y.o.  F) Treating RN: Army Melia Primary Care Provider: Harrel Lemon Other Clinician: Referring Provider: Harrel Lemon Treating Provider/Extender: Melburn Hake, HOYT Weeks in Treatment: 8 Constitutional Well-nourished and well-hydrated in no acute distress. Respiratory normal breathing without difficulty. clear to auscultation bilaterally. Cardiovascular regular rate and rhythm with normal S1, S2. Psychiatric Patient is not able to cooperate in decision making regarding care. Patient has dementia. pleasant and cooperative. Notes Patient's wound bed currently showed signs of some healing as far as the actual wounds are concerned but she has a lot more skin irritation and tearing from the actual dressings that are being applied which is not good news. Fortunately there is no evidence of active infection at this point. No fevers, chills, nausea, vomiting, or diarrhea. Electronic Signature(s) Signed: 01/17/2019 1:22:08 PM By: Worthy Keeler PA-C Entered By: Worthy Keeler on 01/17/2019 13:22:08 KEONI, HAVEY ([id number]) -------------------------------------------------------------------------------- Physician Orders Details Patient Name: [name] Date of Service: 01/17/2019 10:15 AM Medical Record Number: [id number] Patient Account Number: [id number] Date of Birth/Sex: [birth year] (68 y.o. F) Treating RN: Army Melia Primary Care Provider: Harrel Lemon Other Clinician: Referring Provider: Harrel Lemon Treating Provider/Extender: Melburn Hake, HOYT Weeks in Treatment: 8 Verbal / Phone Orders: No Diagnosis Coding ICD-10 Coding Code Description L24.89 Irritant contact dermatitis due to other agents F03.90 Unspecified dementia without behavioral disturbance Z94.0 Kidney transplant status I10 Essential (primary) hypertension Wound Cleansing Wound #1 Left Gluteal fold o Clean wound with Normal Saline. - in office o Cleanse wound with mild soap and water Wound #2 Right  Gluteal fold o Clean wound with Normal  Saline. - in office o Cleanse wound with mild soap and water Wound #3 Right,Medial Upper Leg o Clean wound with Normal Saline. - in office o Cleanse wound with mild soap and water Wound #4 Left Upper Leg o Clean wound with Normal Saline. - in office o Cleanse wound with mild soap and water Wound #5 Left Gluteus o Clean wound with Normal Saline. - in office o Cleanse wound with mild soap and water Skin Barriers/Peri-Wound Care Wound #1 Left Gluteal fold o Skin Prep - periwound Wound #2 Right Gluteal fold o Skin Prep - periwound Wound #3 Right,Medial Upper Leg o Skin Prep - periwound Wound #4 Left Upper Leg o Skin Prep - periwound Wound #5 Left Gluteus o Skin Prep - periwound Helle, Navil ([id number]) Primary Wound Dressing Wound #1 Left Gluteal fold o Silver Alginate Wound #2 Right Gluteal fold o Silver Alginate Wound #3 Right,Medial Upper Leg o Silver Alginate Wound #4 Left Upper Leg o Silver Alginate Wound #5 Left Gluteus o Silver Alginate Secondary Dressing Wound #1 Left Gluteal fold o Boardered Foam Dressing - use gauze and tape until border foam is available Wound #2 Right Gluteal fold o Boardered Foam Dressing - use gauze and tape until border foam is available Wound #3 Right,Medial Upper Leg o Boardered Foam Dressing - use gauze and tape until border foam is available Wound #4 Left Upper Leg o Boardered Foam Dressing - use gauze and tape until border foam is available Wound #5 Left Gluteus o Boardered Foam Dressing - use gauze and tape until border foam is available Dressing Change Frequency Wound #1 Left Gluteal fold o Change dressing every other day. - as needed for soilage Wound #2 Right Gluteal fold o Change dressing every other day. - as needed for soilage Wound #3 Right,Medial Upper Leg o Change dressing every other day. - as needed for soilage Wound #4 Left  Upper Leg o Change dressing every other day. - as needed for soilage Wound #5 Left Gluteus o Change dressing every other day. - as needed for soilage Follow-up Appointments Wound #1 Left Gluteal fold o Return Appointment in 1 week. Wound #2 Right Gluteal fold o Return Appointment in 1 week. Wound #3 Right,Medial Upper Leg AZORIA, ABBETT ([id number]) o Return Appointment in 1 week. Wound #4 Left Upper Leg o Return Appointment in 1 week. Wound #5 Left Gluteus o Return Appointment in 1 week. Off-Loading Wound #1 Left Gluteal fold o Turn and reposition every 2 hours o Other: - memory foam cushion while siting Wound #2 Right Gluteal fold o Turn and reposition every 2 hours o Other: - memory foam cushion while siting Wound #3 Right,Medial Upper Leg o Turn and reposition every 2 hours o Other: - memory foam cushion while siting Wound #4 Left Upper Leg o Turn and reposition every 2 hours o Other: - memory foam cushion while siting Wound #5 Left Gluteus o Turn and reposition every 2 hours o Other: - memory foam cushion while Sports administrator Signature(s) Signed: 01/17/2019 12:37:56 PM By: Army Melia Signed: 01/17/2019 5:16:54 PM By: Worthy Keeler PA-C Entered By: Army Melia on 01/17/2019 11:12:18 [place] ([id number]) -------------------------------------------------------------------------------- Problem List Details Patient Name: [name] Date of Service: 01/17/2019 10:15 AM Medical Record Number: [id number] Patient Account Number: [id number] Date of Birth/Sex: 26-Feb-1950 (68 y.o. F) Treating RN: Army Melia Primary Care Provider: Harrel Lemon Other Clinician: Referring Provider: Harrel Lemon Treating Provider/Extender: Melburn Hake, HOYT Weeks in Treatment: 8 Active Problems ICD-10 Evaluated Encounter Code Description  Active Date Today Diagnosis L24.89 Irritant contact dermatitis due to other agents 11/22/2018 No  Yes F03.90 Unspecified dementia without behavioral disturbance 11/22/2018 No Yes Z94.0 Kidney transplant status 11/22/2018 No Yes I10 Essential (primary) hypertension 11/22/2018 No Yes Inactive Problems Resolved Problems Electronic Signature(s) Signed: 01/17/2019 10:40:17 AM By: Worthy Keeler PA-C Entered By: Worthy Keeler on 01/17/2019 10:40:17 [place], [name] ([id number]) -------------------------------------------------------------------------------- Progress Note Details Patient Name: [name] Date of Service: 01/17/2019 10:15 AM Medical Record Number: [id number] Patient Account Number: [id number] Date of Birth/Sex: 1950-06-23 (68 y.o. F) Treating RN: Army Melia Primary Care Provider: Harrel Lemon Other Clinician: Referring Provider: Harrel Lemon Treating Provider/Extender: Melburn Hake, HOYT Weeks in Treatment: 8 Subjective Chief Complaint Information obtained from Patient Left gluteal fold ulcer History of Present Illness (HPI) 11/22/2018 patient presents today for initial evaluation in our clinic concerning issues that she has been having with a wound in the left gluteal fold. This has been present since August and was noted initially when she was in the hospital. Fortunately there is no signs of active infection at this time. No fever chills noted. Up to this point DuoDERM has been utilized although I am not sure that is the best treatment option for her based on what I am seeing currently. This actually started as a result of moisture associated skin damage secondary to a C. difficile infection. The patient does have dementia. She is also status post having had a kidney transplant and does have hypertension as well. She does spend a lot of her time during the day sitting which is probably a part of why this is having trouble healing although it is not really a pressure ulcer that may be contributing to the lack of significant and rapid improvement. She still is having  diarrhea as well and is still on treatment for the C. difficile infection she is on Dificid. They have been using barrier cream in the gluteal region to try to help protect the area where the dressing was not. 11/29/2018 upon evaluation today patient does not appear to be doing significantly better at this time. She still had some somewhat softer stools but not really as much diarrhea as what she had when the C. difficile infection was not full cycle. Overall things seem to be doing better in that regard although she does have 2 new areas on evaluation today. There is no signs of active infection. No fevers, chills, nausea, vomiting, or diarrhea. 12/06/2018 on evaluation today patient actually appears to be doing better with regard to the wounds in the gluteal region in general. Overall the biggest issue has been that apparently one of the wounds was missed by the home health nurse and not noted to be open and its been getting stuck to her brief. Nonetheless other than that and even with that considered she still seems to actually be doing quite well as far as I am seeing today. 12/20/2018 on evaluation today patient appears to be doing very well in regard to her wounds. Fortunately there is no sign of active infection at this time which is great news. She has been tolerating the dressing changes without complication although advanced home care is no longer coming out they just discontinued the patient's treatment all of a sudden standing that they were complete as far as what they could do. Nonetheless unfortunately the patient still has wounds open and has not had any dressings to put on the area they have been doing the best they could until they  came in here today. There is no evidence of infection and things seem to be doing quite well. 01/03/2019 on evaluation today patient appears to be doing okay in some regards regarding her wounds. Specifically the main wound on the left gluteal fold in  the right gluteal fold seems to be doing much better. The right gluteal fold in fact is pretty much healed. On the left this still has a little bit deeper area open at this point I think that still needs a bandage. Unfortunately she got different bandages than the silicone, Allevyn, bandages that we had recommended and ordered for her. For that reason she unfortunately does have some additional breakdown around where the bandages have been pulling at this time. This has caused several areas of skin breakdown unfortunately. Fortunately there is no signs of active infection at this time. No fevers, chills, nausea, vomiting, or diarrhea. 01/17/2019 on evaluation today patient appears to be having a lot of issues with skin tearing due to adhesive's from the border foam dressing that was sent from the medical supplier. With that being said we specifically ordered Allevyn dressings I am not sure why they sent the other and less it has a insurance issue with not actually covering the dressings that they feel like that is better for her. With that being said there does not appear to be any signs of active infection at this point which is good news. Nonetheless she is still having pain. She is seen with a caregiver here today. VONDELL, SOWELL ([id number]) Patient History Unable to Obtain Patient History due to Dementia. Information obtained from Patient. Family History Heart Disease - Siblings, No family history of Cancer, Diabetes, Hypertension, Kidney Disease, Lung Disease, Seizures, Stroke, Thyroid Problems, Tuberculosis. Social History Former smoker - Quit 40+ years ago, Marital Status - Married, Alcohol Use - Never, Drug Use - No History, Caffeine Use - Rarely. Medical History Endocrine Denies history of Type I Diabetes, Type II Diabetes Neurologic Patient has history of Dementia, Neuropathy - hands Oncologic Denies history of Received Chemotherapy, Received Radiation Hospitalization/Surgery  History - [facility] UTI. Medical And Surgical History Notes Genitourinary Kidney Transpant 5+ years ago Review of Systems (ROS) Constitutional Symptoms (General Health) Denies complaints or symptoms of Fatigue, Fever, Chills, Marked Weight Change. Respiratory Denies complaints or symptoms of Chronic or frequent coughs, Shortness of Breath. Cardiovascular Denies complaints or symptoms of Chest pain, LE edema. Psychiatric Denies complaints or symptoms of Anxiety, Claustrophobia. Objective Constitutional Well-nourished and well-hydrated in no acute distress. Vitals Time Taken: 10:25 AM, Height: 59 in, Weight: 193 lbs, BMI: 39, Temperature: 97.6 F, Pulse: 66 bpm, Respiratory Rate: 18 breaths/min, Blood Pressure: 144/88 mmHg. Respiratory normal breathing without difficulty. clear to auscultation bilaterally. [place], [place] ([id number]) Cardiovascular regular rate and rhythm with normal S1, S2. Psychiatric Patient is not able to cooperate in decision making regarding care. Patient has dementia. pleasant and cooperative. General Notes: Patient's wound bed currently showed signs of some healing as far as the actual wounds are concerned but she has a lot more skin irritation and tearing from the actual dressings that are being applied which is not good news. Fortunately there is no evidence of active infection at this point. No fevers, chills, nausea, vomiting, or diarrhea. Integumentary (Hair, Skin) Wound #1 status is Open. Original cause of wound was Gradually Appeared. The wound is located on the Left Gluteal fold. The wound measures 0.3cm length x 0.5cm width x 0.1cm depth; 0.118cm^2 area and 0.012cm^3 volume. There is Fat Layer (Subcutaneous  Tissue) Exposed exposed. There is no tunneling or undermining noted. There is a medium amount of serosanguineous drainage noted. The wound margin is flat and intact. There is medium (34-66%) red granulation within the wound bed. There is a small  (1-33%) amount of necrotic tissue within the wound bed including Adherent Slough. Wound #2 status is Open. Original cause of wound was Gradually Appeared. The wound is located on the Right Gluteal fold. The wound measures 0.2cm length x 0.2cm width x 0.1cm depth; 0.031cm^2 area and 0.003cm^3 volume. There is Fat Layer (Subcutaneous Tissue) Exposed exposed. There is no tunneling or undermining noted. There is a medium amount of serosanguineous drainage noted. The wound margin is flat and intact. There is medium (34-66%) red granulation within the wound bed. There is a small (1-33%) amount of necrotic tissue within the wound bed including Adherent Slough. Wound #3 status is Open. Original cause of wound was Gradually Appeared. The wound is located on the Right,Medial Upper Leg. The wound measures 0.5cm length x 1cm width x 0.1cm depth; 0.393cm^2 area and 0.039cm^3 volume. There is Fat Layer (Subcutaneous Tissue) Exposed exposed. There is no tunneling or undermining noted. There is a medium amount of serosanguineous drainage noted. The wound margin is flat and intact. There is medium (34-66%) red granulation within the wound bed. There is a small (1-33%) amount of necrotic tissue within the wound bed including Adherent Slough. Wound #4 status is Open. Original cause of wound was Gradually Appeared. The wound is located on the Left Upper Leg. The wound measures 4cm length x 4cm width x 0.1cm depth; 12.566cm^2 area and 1.257cm^3 volume. There is Fat Layer (Subcutaneous Tissue) Exposed exposed. There is no tunneling or undermining noted. There is a medium amount of serosanguineous drainage noted. The wound margin is flat and intact. There is medium (34-66%) red granulation within the wound bed. There is a small (1-33%) amount of necrotic tissue within the wound bed including Adherent Slough. Wound #5 status is Open. Original cause of wound was Gradually Appeared. The wound is located on the Left Gluteus.  The wound measures 1.5cm length x 2.5cm width x 0.1cm depth; 2.945cm^2 area and 0.295cm^3 volume. There is Fat Layer (Subcutaneous Tissue) Exposed exposed. There is no tunneling or undermining noted. There is a medium amount of serosanguineous drainage noted. The wound margin is flat and intact. There is medium (34-66%) red granulation within the wound bed. There is a small (1-33%) amount of necrotic tissue within the wound bed including Adherent Slough. Assessment Active Problems ICD-10 Irritant contact dermatitis due to other agents Unspecified dementia without behavioral disturbance Kidney transplant status Essential (primary) hypertension [place], Lilliane ([id number]) Plan Wound Cleansing: Wound #1 Left Gluteal fold: Clean wound with Normal Saline. - in office Cleanse wound with mild soap and water Wound #2 Right Gluteal fold: Clean wound with Normal Saline. - in office Cleanse wound with mild soap and water Wound #3 Right,Medial Upper Leg: Clean wound with Normal Saline. - in office Cleanse wound with mild soap and water Wound #4 Left Upper Leg: Clean wound with Normal Saline. - in office Cleanse wound with mild soap and water Wound #5 Left Gluteus: Clean wound with Normal Saline. - in office Cleanse wound with mild soap and water Skin Barriers/Peri-Wound Care: Wound #1 Left Gluteal fold: Skin Prep - periwound Wound #2 Right Gluteal fold: Skin Prep - periwound Wound #3 Right,Medial Upper Leg: Skin Prep - periwound Wound #4 Left Upper Leg: Skin Prep - periwound Wound #5 Left Gluteus: Skin  Prep - periwound Primary Wound Dressing: Wound #1 Left Gluteal fold: Silver Alginate Wound #2 Right Gluteal fold: Silver Alginate Wound #3 Right,Medial Upper Leg: Silver Alginate Wound #4 Left Upper Leg: Silver Alginate Wound #5 Left Gluteus: Silver Alginate Secondary Dressing: Wound #1 Left Gluteal fold: Boardered Foam Dressing - use gauze and tape until border foam is  available Wound #2 Right Gluteal fold: Boardered Foam Dressing - use gauze and tape until border foam is available Wound #3 Right,Medial Upper Leg: Boardered Foam Dressing - use gauze and tape until border foam is available Wound #4 Left Upper Leg: Boardered Foam Dressing - use gauze and tape until border foam is available Wound #5 Left Gluteus: Boardered Foam Dressing - use gauze and tape until border foam is available Dressing Change Frequency: Wound #1 Left Gluteal fold: Change dressing every other day. - as needed for soilage Wound #2 Right Gluteal fold: Change dressing every other day. - as needed for soilage [place], Vaneza ([id number]) Wound #3 Right,Medial Upper Leg: Change dressing every other day. - as needed for soilage Wound #4 Left Upper Leg: Change dressing every other day. - as needed for soilage Wound #5 Left Gluteus: Change dressing every other day. - as needed for soilage Follow-up Appointments: Wound #1 Left Gluteal fold: Return Appointment in 1 week. Wound #2 Right Gluteal fold: Return Appointment in 1 week. Wound #3 Right,Medial Upper Leg: Return Appointment in 1 week. Wound #4 Left Upper Leg: Return Appointment in 1 week. Wound #5 Left Gluteus: Return Appointment in 1 week. Off-Loading: Wound #1 Left Gluteal fold: Turn and reposition every 2 hours Other: - memory foam cushion while siting Wound #2 Right Gluteal fold: Turn and reposition every 2 hours Other: - memory foam cushion while siting Wound #3 Right,Medial Upper Leg: Turn and reposition every 2 hours Other: - memory foam cushion while siting Wound #4 Left Upper Leg: Turn and reposition every 2 hours Other: - memory foam cushion while siting Wound #5 Left Gluteus: Turn and reposition every 2 hours Other: - memory foam cushion while siting 1. My suggestion at this time is good to be that we go ahead and continue with the silver alginate dressing really to all wound locations to try to help  dry things up and help these areas to heal more appropriately. 2. We will use the border foam dressings here in the clinic that do not cause any trouble. At home though use gauze and tape which they say seems to be better for her skin as well if need be if were not able to get the Allevyn dressings. 3. I still recommend the patient needs to try to rotate positions and not sit too long in any 1 position on a regular basis. We will see patient back for reevaluation in 1 week here in the clinic. If anything worsens or changes patient will contact our office for additional recommendations. Electronic Signature(s) Signed: 01/17/2019 1:25:12 PM By: Worthy Keeler PA-C Entered By: Worthy Keeler on 01/17/2019 13:25:12 AVANNAH, DECKER ([id number]) -------------------------------------------------------------------------------- ROS/PFSH Details Patient Name: [name] Date of Service: 01/17/2019 10:15 AM Medical Record Number: [id number] Patient Account Number: [id number] Date of Birth/Sex: 01/24/1951 (68 y.o. F) Treating RN: Army Melia Primary Care Provider: Harrel Lemon Other Clinician: Referring Provider: Harrel Lemon Treating Provider/Extender: Melburn Hake, HOYT Weeks in Treatment: 8 Unable to Obtain Patient History due to oo Dementia Information Obtained From Patient Constitutional Symptoms (General Health) Complaints and Symptoms: Negative for: Fatigue; Fever; Chills; Marked Weight Change Respiratory Complaints  and Symptoms: Negative for: Chronic or frequent coughs; Shortness of Breath Cardiovascular Complaints and Symptoms: Negative for: Chest pain; LE edema Psychiatric Complaints and Symptoms: Negative for: Anxiety; Claustrophobia Endocrine Medical History: Negative for: Type I Diabetes; Type II Diabetes Genitourinary Medical History: Past Medical History Notes: Kidney Transpant 5+ years ago Neurologic Medical History: Positive for: Dementia; Neuropathy -  hands Oncologic Medical History: Negative for: Received Chemotherapy; Received Radiation Immunizations Pneumococcal Vaccine: Received Pneumococcal Vaccination: Yes AZAYLAH, STAILEY ([id number]) Implantable Devices Yes Hospitalization / Surgery History Type of Hospitalization/Surgery [facility] UTI Family and Social History Cancer: No; Diabetes: No; Heart Disease: Yes - Siblings; Hypertension: No; Kidney Disease: No; Lung Disease: No; Seizures: No; Stroke: No; Thyroid Problems: No; Tuberculosis: No; Former smoker - Quit 40+ years ago; Marital Status - Married; Alcohol Use: Never; Drug Use: No History; Caffeine Use: Rarely Physician Affirmation I have reviewed and agree with the above information. Electronic Signature(s) Signed: 01/17/2019 4:43:12 PM By: Army Melia Signed: 01/17/2019 5:16:54 PM By: Worthy Keeler PA-C Entered By: Worthy Keeler on 01/17/2019 13:19:53 [place], [name] ([id number]) -------------------------------------------------------------------------------- SuperBill Details Patient Name: [name] Date of Service: 01/17/2019 Medical Record Number: [id number] Patient Account Number: [id number] Date of Birth/Sex: 1950-05-08 (68 y.o. F) Treating RN: Army Melia Primary Care Provider: Harrel Lemon Other Clinician: Referring Provider: Harrel Lemon Treating Provider/Extender: Melburn Hake, HOYT Weeks in Treatment: 8 Diagnosis Coding ICD-10 Codes Code Description L24.89 Irritant contact dermatitis due to other agents F03.90 Unspecified dementia without behavioral disturbance Z94.0 Kidney transplant status I10 Essential (primary) hypertension Physician Procedures CPT4 Code: [id number] Description: 61224 - WC PHYS LEVEL 4 - EST PT ICD-10 Diagnosis Description L24.89 Irritant contact dermatitis due to other agents F03.90 Unspecified dementia without behavioral disturbance Z94.0 Kidney transplant status I10 Essential (primary)  hypertension Modifier: Quantity:  1 Electronic Signature(s) Signed: 01/17/2019 1:25:43 PM By: Worthy Keeler PA-C Entered By: Worthy Keeler on 01/17/2019 13:25:42

## 2019-01-17 NOTE — Progress Notes (Signed)
[name], [name] ([id number]) Visit Report for 01/17/2019 Arrival Information Details Patient Name: [name], [name] Date of Service: 01/17/2019 10:15 AM Medical Record Number: [id number] Patient Account Number: [id number] Date of Birth/Sex: 1950-11-21 (68 y.o. F) Treating RN: Harold Barban Primary Care Inas Avena: Harrel Lemon Other Clinician: Referring Shyana Kulakowski: Harrel Lemon Treating Tyona Nilsen/Extender: Melburn Hake, HOYT Weeks in Treatment: 8 Visit Information History Since Last Visit Added or deleted any medications: No Patient Arrived: Wheel Chair Any new allergies or adverse reactions: No Arrival Time: 10:24 Had a fall or experienced change in No Accompanied By: caregiver activities of daily living that may affect Transfer Assistance: Manual risk of falls: Patient Requires Transmission-Based No Signs or symptoms of abuse/neglect since last visito No Precautions: Hospitalized since last visit: No Patient Has Alerts: Yes Has Dressing in Place as Prescribed: Yes Patient Alerts: NOT Pain Present Now: No Diabetic Electronic Signature(s) Signed: 01/17/2019 4:50:58 PM By: Harold Barban Entered By: Harold Barban on 01/17/2019 10:25:16 [name] ([id number]) -------------------------------------------------------------------------------- Clinic Level of Care Assessment Details Patient Name: [name] Date of Service: 01/17/2019 10:15 AM Medical Record Number: [id number] Patient Account Number: [id number] Date of Birth/Sex: January 16, 1951 (68 y.o. F) Treating RN: Army Melia Primary Care Narda Fundora: Harrel Lemon Other Clinician: Referring Monet [place]: Harrel Lemon Treating Karianna Gusman/Extender: Melburn Hake, HOYT Weeks in Treatment: 8 Clinic Level of Care Assessment Items TOOL 4 Quantity Score []  - Use when only an EandM is performed on FOLLOW-UP visit 0 ASSESSMENTS - Nursing Assessment / Reassessment X - Reassessment of Co-morbidities (includes updates in patient status)  1 10 X- 1 5 Reassessment of Adherence to Treatment Plan ASSESSMENTS - Wound and Skin Assessment / Reassessment []  - Simple Wound Assessment / Reassessment - one wound 0 X- 5 5 Complex Wound Assessment / Reassessment - multiple wounds []  - 0 Dermatologic / Skin Assessment (not related to wound area) ASSESSMENTS - Focused Assessment []  - Circumferential Edema Measurements - multi extremities 0 []  - 0 Nutritional Assessment / Counseling / Intervention []  - 0 Lower Extremity Assessment (monofilament, tuning fork, pulses) []  - 0 Peripheral Arterial Disease Assessment (using hand held doppler) ASSESSMENTS - Ostomy and/or Continence Assessment and Care []  - Incontinence Assessment and Management 0 []  - 0 Ostomy Care Assessment and Management (repouching, etc.) PROCESS - Coordination of Care X - Simple Patient / Family Education for ongoing care 1 15 []  - 0 Complex (extensive) Patient / Family Education for ongoing care []  - 0 Staff obtains Programmer, systems, Records, Test Results / Process Orders []  - 0 Staff telephones HHA, Nursing Homes / Clarify orders / etc []  - 0 Routine Transfer to another Facility (non-emergent condition) []  - 0 Routine Hospital Admission (non-emergent condition) []  - 0 New Admissions / Biomedical engineer / Ordering NPWT, Apligraf, etc. []  - 0 Emergency Hospital Admission (emergent condition) X- 1 10 Simple Discharge Coordination [place], [name] ([id number]) []  - 0 Complex (extensive) Discharge Coordination PROCESS - Special Needs []  - Pediatric / Minor Patient Management 0 []  - 0 Isolation Patient Management []  - 0 Hearing / Language / Visual special needs []  - 0 Assessment of Community assistance (transportation, D/C planning, etc.) []  - 0 Additional assistance / Altered mentation []  - 0 Support Surface(s) Assessment (bed, cushion, seat, etc.) INTERVENTIONS - Wound Cleansing / Measurement []  - Simple Wound Cleansing - one wound 0 X- 5 5 Complex  Wound Cleansing - multiple wounds X- 1 5 Wound Imaging (photographs - any number of wounds) []  - 0 Wound Tracing (instead of photographs) []  - 0 Simple Wound Measurement -  one wound X- 5 5 Complex Wound Measurement - multiple wounds INTERVENTIONS - Wound Dressings []  - Small Wound Dressing one or multiple wounds 0 X- 5 15 Medium Wound Dressing one or multiple wounds []  - 0 Large Wound Dressing one or multiple wounds []  - 0 Application of Medications - topical []  - 0 Application of Medications - injection INTERVENTIONS - Miscellaneous []  - External ear exam 0 []  - 0 Specimen Collection (cultures, biopsies, blood, body fluids, etc.) []  - 0 Specimen(s) / Culture(s) sent or taken to Lab for analysis []  - 0 Patient Transfer (multiple staff / Civil Service fast streamer / Similar devices) []  - 0 Simple Staple / Suture removal (25 or less) []  - 0 Complex Staple / Suture removal (26 or more) []  - 0 Hypo / Hyperglycemic Management (close monitor of Blood Glucose) []  - 0 Ankle / Brachial Index (ABI) - do not check if billed separately X- 1 5 Vital Signs [name], [name] ([id number]) Has the patient been seen at the hospital within the last three years: Yes Total Score: 200 Level Of Care: New/Established - Level 5 Electronic Signature(s) Signed: 01/17/2019 12:37:56 PM By: Army Melia Entered By: Army Melia on 01/17/2019 11:13:38 [name] ([id number]) -------------------------------------------------------------------------------- Encounter Discharge Information Details Patient Name: [name] Date of Service: 01/17/2019 10:15 AM Medical Record Number: [id number] Patient Account Number: [id number] Date of Birth/Sex: 1950-06-14 (68 y.o. F) Treating RN: Army Melia Primary Care Daleah Coulson: Harrel Lemon Other Clinician: Referring Lorn Butcher: Harrel Lemon Treating Aylanie Cubillos/Extender: Melburn Hake, HOYT Weeks in Treatment: 8 Encounter Discharge Information Items Discharge Condition:  Stable Ambulatory Status: Ambulatory Discharge Destination: Home Transportation: Private Auto Accompanied By: caregiver Schedule Follow-up Appointment: Yes Clinical Summary of Care: Electronic Signature(s) Signed: 01/17/2019 12:37:56 PM By: Army Melia Entered By: Army Melia on 01/17/2019 11:14:31 [place] ([id number]) -------------------------------------------------------------------------------- Lower Extremity Assessment Details Patient Name: [name] Date of Service: 01/17/2019 10:15 AM Medical Record Number: [id number] Patient Account Number: [id number] Date of Birth/Sex: 1950/03/22 (68 y.o. F) Treating RN: Harold Barban Primary Care Meagon Duskin: Harrel Lemon Other Clinician: Referring Maeven Mcdougall: Harrel Lemon Treating Rollan Roger/Extender: Melburn Hake, HOYT Weeks in Treatment: 8 Electronic Signature(s) Signed: 01/17/2019 4:50:58 PM By: Harold Barban Entered By: Harold Barban on 01/17/2019 10:40:53 [place], [name] ([id number]) -------------------------------------------------------------------------------- Multi Wound Chart Details Patient Name: [name] Date of Service: 01/17/2019 10:15 AM Medical Record Number: [id number] Patient Account Number: [id number] Date of Birth/Sex: [birth year] (68 y.o. 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) Dermatitis (IAD)  Dermatitis (IAD) Secondary Etiology: Pressure Ulcer N/A N/A Comorbid History: Dementia, Neuropathy Dementia, Neuropathy Dementia, Neuropathy Date Acquired: 10/16/2018 11/29/2018 11/29/2018 Weeks of Treatment: 8 7 7  Wound Status: Open Open Open Clustered Wound: No No Yes Measurements L x W x D 0.3x0.5x0.1 0.2x0.2x0.1 0.5x1x0.1 (cm) Area (cm) : 0.118 0.031 0.393 Volume (cm) : 0.012 0.003 0.039 % Reduction in Area: 94.70% 96.30% 16.60% % Reduction in Volume: 94.60% 96.50% 17.00% Classification: Full Thickness Without Full Thickness Without Full Thickness Without Exposed Support Structures Exposed Support Structures Exposed Support Structures Exudate Amount: Medium Medium Medium Exudate Type: Serosanguineous Serosanguineous Serosanguineous Exudate Color: red, brown red,  brown red, brown Wound Margin: Flat and Intact Flat and Intact Flat and Intact Granulation Amount: Medium (34-66%) Medium (34-66%) Medium (34-66%) Granulation Quality: Red Red Red Necrotic Amount: Small (1-33%) Small (1-33%) Small (1-33%) Exposed Structures: Fat Layer (Subcutaneous Fat Layer (Subcutaneous Fat Layer (Subcutaneous Tissue) Exposed: Yes Tissue) Exposed: Yes Tissue) Exposed: Yes Fascia: No Fascia: No Fascia: No Tendon: No Tendon: No Tendon: No [name], [name] ([id number]) Muscle: No Muscle: No Muscle: No Joint: No Joint: No Joint: No Bone: No Bone: No Bone: No Epithelialization: None None Small (1-33%) Wound Number: 4 5 N/A Photos: N/A Wound Location: Left Upper Leg Left Gluteus N/A Wounding Event: Gradually Appeared Gradually Appeared N/A Primary Etiology: MASD Trauma, Other N/A Secondary Etiology: N/A N/A N/A Comorbid History: Dementia, Neuropathy Dementia, Neuropathy N/A Date Acquired: 12/31/2018 12/24/2018 N/A Weeks of Treatment: 2 2 N/A Wound Status: Open Open N/A Clustered Wound: Yes No N/A Measurements L x W x D 4x4x0.1 1.5x2.5x0.1 N/A (cm) Area (cm) : 12.566 2.945 N/A Volume  (cm) : 1.257 0.295 N/A % Reduction in Area: -899.70% -1147.90% N/A % Reduction in Volume: -897.60% -1129.20% N/A Classification: Partial Thickness Partial Thickness N/A Exudate Amount: Medium Medium N/A Exudate Type: Serosanguineous Serosanguineous N/A Exudate Color: red, brown red, brown N/A Wound Margin: Flat and Intact Flat and Intact N/A Granulation Amount: Medium (34-66%) Medium (34-66%) N/A Granulation Quality: Red Red N/A Necrotic Amount: Small (1-33%) Small (1-33%) N/A Exposed Structures: Fat Layer (Subcutaneous Fat Layer (Subcutaneous N/A Tissue) Exposed: Yes Tissue) Exposed: Yes Fascia: No Fascia: No Tendon: No Tendon: No Muscle: No Muscle: No Joint: No Joint: No Bone: No Bone: No Epithelialization: Small (1-33%) Small (1-33%) N/A Treatment Notes Electronic Signature(s) Signed: 01/17/2019 12:37:56 PM By: Army Melia Entered By: Army Melia on 01/17/2019 11:07:45 [name] ([id number]) -------------------------------------------------------------------------------- Multi-Disciplinary Care Plan Details Patient Name: [name] Date of Service: 01/17/2019 10:15 AM Medical Record Number: [id number] Patient Account Number: [id number] Date of Birth/Sex: January 09, 1951 (68 y.o. F) Treating RN: Army Melia Primary Care Mekhia Brogan: Harrel Lemon Other Clinician: Referring Hadlea Furuya: Harrel Lemon Treating Takayla Baillie/Extender: Melburn Hake, HOYT Weeks in Treatment: 8 Active Inactive Abuse / Safety / Falls / Self Care Management Nursing Diagnoses: Potential for falls Goals: Patient/caregiver will demonstrate safe use of adaptive devices to increase mobility Date Initiated: 11/22/2018 Target Resolution Date: 12/21/2018 Goal Status: Active Interventions: Assess: immobility, friction, shearing, incontinence upon admission and as needed Notes: Orientation to the Wound Care Program Nursing Diagnoses: Knowledge deficit related to the wound healing center  program Goals: Patient/caregiver will verbalize understanding of the [facility] Date Initiated: 11/22/2018 Target Resolution Date: 12/21/2018 Goal Status: Active Interventions: Provide education on orientation to the wound center Notes: Pressure Nursing Diagnoses: Knowledge deficit related to management of pressures ulcers Goals: Patient/caregiver will verbalize risk factors for pressure ulcer development Date Initiated: 11/22/2018 Target Resolution Date: 12/21/2018 Goal Status: Active Interventions: Assess: immobility, friction, shearing, incontinence upon admission and as needed [name], [name] ([id number]) Notes: Wound/Skin Impairment Nursing Diagnoses: Impaired tissue integrity Goals: Ulcer/skin breakdown will have a volume reduction of 30% by week 4 Date Initiated: 11/22/2018 Target Resolution Date: 12/21/2018 Goal Status: Active Interventions: Assess ulceration(s) every visit Notes: Electronic Signature(s) Signed: 01/17/2019 12:37:56 PM By: Army Melia Entered By: Army Melia on 01/17/2019 11:06:48 [place] ([id number]) -------------------------------------------------------------------------------- Pain Assessment Details Patient Name: [name] Date of Service: 01/17/2019 10:15 AM Medical Record Number: [id number] Patient Account Number: [id number] Date of Birth/Sex: May 10, 1950 (68 y.o. F) Treating RN: Harold Barban Primary Care Charistopher Rumble: Harrel Lemon Other Clinician: Referring Carlyn Lemke:  Harrel Lemon Treating Tamiyah Moulin/Extender: Melburn Hake, HOYT Weeks in Treatment: 8 Active Problems Location of Pain Severity and Description of Pain Patient Has Paino No Site Locations Pain Management and Medication Current Pain Management: Electronic Signature(s) Signed: 01/17/2019 4:50:58 PM By: Harold Barban Entered By: Harold Barban on 01/17/2019 10:25:23 [name]  ([id number]) -------------------------------------------------------------------------------- Patient/Caregiver Education Details Patient Name: [name] Date of Service: 01/17/2019 10:15 AM Medical Record Number: [id number] Patient Account Number: [id number] Date of Birth/Gender: October 29, 1950 (68 y.o. F) Treating RN: Army Melia Primary Care Physician: Harrel Lemon Other Clinician: Referring Physician: Harrel Lemon Treating Physician/Extender: Sharalyn Ink in Treatment: 8 Education Assessment Education Provided To: Patient Education Topics Provided Wound/Skin Impairment: Handouts: Caring for Your Ulcer Methods: Demonstration, Explain/Verbal Responses: State content correctly Electronic Signature(s) Signed: 01/17/2019 12:37:56 PM By: Army Melia Entered By: Army Melia on 01/17/2019 11:13:55 [place], [name] ([id number]) -------------------------------------------------------------------------------- Wound Assessment Details Patient Name: [name] Date of Service: 01/17/2019 10:15 AM Medical Record Number: [id number] Patient Account Number: [id number] Date of Birth/Sex: 09/21/1950 (68 y.o. F) Treating RN: Harold Barban Primary Care Nayel Purdy: Harrel Lemon Other Clinician: Referring Keryl Gholson: Harrel Lemon Treating Reuben Knoblock/Extender: Melburn Hake, HOYT Weeks in Treatment: 8 Wound Status Wound Number: 1 Primary Etiology: Incontinence Associated Dermatitis (IAD) Wound Location: Left Gluteal fold Secondary Etiology: Pressure Ulcer Wounding Event: Gradually Appeared Wound Status: Open Date Acquired: 10/16/2018 Comorbid History: Dementia, Neuropathy Weeks Of Treatment: 8 Clustered Wound: No Photos Wound Measurements Length: (cm) 0.3 Width: (cm) 0.5 Depth: (cm) 0.1 Area: (cm) 0.118 Volume: (cm) 0.012 % Reduction in Area: 94.7% % Reduction in Volume: 94.6% Epithelialization: None Tunneling: No Undermining: No Wound Description Full Thickness  Without Exposed Support Classification: Structures Wound Margin: Flat and Intact Exudate Medium Amount: Exudate Type: Serosanguineous Exudate Color: red, brown Foul Odor After Cleansing: No Slough/Fibrino Yes Wound Bed Granulation Amount: Medium (34-66%) Exposed Structure Granulation Quality: Red Fascia Exposed: No Necrotic Amount: Small (1-33%) Fat Layer (Subcutaneous Tissue) Exposed: Yes Necrotic Quality: Adherent Slough Tendon Exposed: No Muscle Exposed: No Joint Exposed: No Bone Exposed: No [name], [name] ([id number]) Treatment Notes Wound #1 (Left Gluteal fold) Notes silver cell, BFD Electronic Signature(s) Signed: 01/17/2019 4:50:58 PM By: Harold Barban Entered By: Harold Barban on 01/17/2019 10:36:37 [place] ([id number]) -------------------------------------------------------------------------------- Wound Assessment Details Patient Name: [name] Date of Service: 01/17/2019 10:15 AM Medical Record Number: [id number] Patient Account Number: [id number] Date of Birth/Sex: 1950-12-14 (68 y.o. F) Treating RN: Harold Barban Primary Care Shellene Sweigert: Harrel Lemon Other Clinician: Referring Ildefonso Keaney: Harrel Lemon Treating Inda Mcglothen/Extender: Melburn Hake, HOYT Weeks in Treatment: 8 Wound Status Wound Number: 2 Primary Etiology: Incontinence Associated Dermatitis (IAD) Wound Location: Right Gluteal fold Wound Status: Open Wounding Event: Gradually Appeared Comorbid History: Dementia, Neuropathy Date Acquired: 11/29/2018 Weeks Of Treatment: 7 Clustered Wound: No Photos Wound Measurements Length: (cm) 0.2 Width: (cm) 0.2 Depth: (cm) 0.1 Area: (cm) 0.031 Volume: (cm) 0.003 % Reduction in Area: 96.3% % Reduction in Volume: 96.5% Epithelialization: None Tunneling: No Undermining: No Wound Description Full Thickness Without Exposed Support Classification: Structures Wound Margin: Flat and Intact Exudate Medium Amount: Exudate Type:  Serosanguineous Exudate Color: red, brown Foul Odor After Cleansing: No Slough/Fibrino Yes Wound Bed Granulation Amount: Medium (34-66%) Exposed Structure Granulation Quality: Red Fascia Exposed: No Necrotic Amount: Small (1-33%) Fat Layer (Subcutaneous Tissue) Exposed: Yes Necrotic Quality: Adherent Slough Tendon Exposed: No Muscle Exposed: No Joint Exposed: No Bone Exposed: No [name], [name] ([id number]) Treatment Notes Wound #2 (Right Gluteal fold) Notes silver cell, BFD Electronic Signature(s) Signed: 01/17/2019 4:50:58 PM By: Oretha Ellis,  Carmell Austria Entered By: Harold Barban on 01/17/2019 10:37:37 [name] ([id number]) -------------------------------------------------------------------------------- Wound Assessment Details Patient Name: [name], [name] Date of Service: 01/17/2019 10:15 AM Medical Record Number: [id number] Patient Account Number: [id number] Date of Birth/Sex: 1950-08-21 (68 y.o. F) Treating RN: Harold Barban Primary Care Jerico Grisso: Harrel Lemon Other Clinician: Referring Mattye Verdone: Harrel Lemon Treating Nandika Stetzer/Extender: Melburn Hake, HOYT Weeks in Treatment: 8 Wound Status Wound Number: 3 Primary Etiology: Incontinence Associated Dermatitis (IAD) Wound Location: Right Upper Leg - Medial Wound Status: Open Wounding Event: Gradually Appeared Comorbid History: Dementia, Neuropathy Date Acquired: 11/29/2018 Weeks Of Treatment: 7 Clustered Wound: Yes Wound Measurements Length: (cm) 0.5 Width: (cm) 1 Depth: (cm) 0.1 Area: (cm) 0.393 Volume: (cm) 0.039 % Reduction in Area: 16.6% % Reduction in Volume: 17% Epithelialization: Small (1-33%) Tunneling: No Undermining: No Wound Description Full Thickness Without Exposed Support Classification: Structures Wound Margin: Flat and Intact Exudate Medium Amount: Exudate Type: Serosanguineous Exudate Color: red, brown Foul Odor After Cleansing: No Slough/Fibrino Yes Wound Bed Granulation  Amount: Medium (34-66%) Exposed Structure Granulation Quality: Red Fascia Exposed: No Necrotic Amount: Small (1-33%) Fat Layer (Subcutaneous Tissue) Exposed: Yes Necrotic Quality: Adherent Slough Tendon Exposed: No Muscle Exposed: No Joint Exposed: No Bone Exposed: No Treatment Notes Wound #3 (Right, Medial Upper Leg) Notes silver cell, BFD Electronic Signature(s) Signed: 01/17/2019 4:50:58 PM By: Harold Barban Entered By: Harold Barban on 01/17/2019 10:38:52 [name] ([id number]) -------------------------------------------------------------------------------- Wound Assessment Details Patient Name: [name] Date of Service: 01/17/2019 10:15 AM Medical Record Number: [id number] Patient Account Number: [id number] Date of Birth/Sex: 10-19-1950 (68 y.o. 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 Wound Status: Open Wounding Event: Gradually Appeared Comorbid History: Dementia, Neuropathy Date Acquired: 12/31/2018 Weeks Of Treatment: 2 Clustered Wound: Yes Photos Wound Measurements Length: (cm) 4 % Reduction in A Width: (cm) 4 % Reduction in V Depth: (cm) 0.1 Epithelializatio Area: (cm) 12.566 Tunneling: Volume: (cm) 1.257 Undermining: rea: -899.7% olume: -897.6% n: Small (1-33%) No No Wound Description Classification: Partial Thickness Foul Odor After Wound Margin: Flat and Intact Slough/Fibrino Exudate Amount: Medium Exudate Type: Serosanguineous Exudate Color: red, brown Cleansing: No Yes Wound Bed Granulation Amount: Medium (34-66%) Exposed Structure Granulation Quality: Red Fascia Exposed: No Necrotic Amount: Small (1-33%) Fat Layer (Subcutaneous Tissue) Exposed: Yes Necrotic Quality: Adherent Slough Tendon  Exposed: No Muscle Exposed: No Joint Exposed: No Bone Exposed: No Treatment Notes [name], [name] ([id number]) Wound #4 (Left Upper Leg) Notes silver cell, BFD Electronic Signature(s) Signed: 01/17/2019 4:50:58 PM By: Harold Barban Entered By: Harold Barban on 01/17/2019 10:39:55 [place] ([id number]) -------------------------------------------------------------------------------- Wound Assessment Details Patient Name: [name] Date of Service: 01/17/2019 10:15 AM Medical Record Number: [id number] Patient Account Number: [id number] Date of Birth/Sex: 1950-11-11 (68 y.o. F) Treating RN: Harold Barban Primary Care Shawntavia Saunders: Harrel Lemon Other Clinician: Referring Estevon Fluke: Harrel Lemon Treating Camielle Sizer/Extender: Melburn Hake, HOYT Weeks in Treatment: 8 Wound Status Wound Number: 5 Primary Etiology: Trauma, Other Wound Location: Left Gluteus Wound Status: Open Wounding Event: Gradually Appeared Comorbid History: Dementia, Neuropathy Date Acquired: 12/24/2018 Weeks Of Treatment: 2 Clustered Wound: No Photos Wound Measurements Length: (cm) 1.5 % Reduction in Width: (cm) 2.5 % Reduction in Depth: (cm) 0.1 Epithelializati Area: (cm) 2.945 Tunneling: Volume: (cm) 0.295 Undermining: Area: -1147.9% Volume: -1129.2% on: Small (1-33%) No No Wound Description Classification: Partial Thickness Foul Odor Afte Wound Margin: Flat  and Intact Slough/Fibrino Exudate Amount: Medium Exudate Type: Serosanguineous Exudate Color: red, brown r Cleansing: No Yes Wound Bed Granulation Amount: Medium (34-66%) Exposed Structure Granulation Quality: Red Fascia Exposed: No Necrotic Amount: Small (1-33%) Fat Layer (Subcutaneous Tissue) Exposed: Yes Necrotic Quality: Adherent Slough Tendon Exposed: No Muscle Exposed: No Joint Exposed: No Bone Exposed: No Treatment Notes ADDISYN, LECLAIRE ([id number]) Wound #5 (Left Gluteus) Notes silver cell, BFD Electronic  Signature(s) Signed: 01/17/2019 4:50:58 PM By: Harold Barban Entered By: Harold Barban on 01/17/2019 10:40:40 [name] ([id number]) -------------------------------------------------------------------------------- Vitals Details Patient Name: [name] Date of Service: 01/17/2019 10:15 AM Medical Record Number: [id number] Patient Account Number: [id number] Date of Birth/Sex: 1950/07/09 (68 y.o. F) Treating RN: Harold Barban Primary Care Eliga Arvie: Harrel Lemon Other Clinician: Referring Elisandra Deshmukh: Harrel Lemon Treating Jashae Wiggs/Extender: Melburn Hake, HOYT Weeks in Treatment: 8 Vital Signs Time Taken: 10:25 Temperature (F): 97.6 Height (in): 59 Pulse (bpm): 66 Weight (lbs): 193 Respiratory Rate (breaths/min): 18 Body Mass Index (BMI): 39 Blood Pressure (mmHg): 144/88 Reference Range: 80 - 120 mg / dl Electronic Signature(s) Signed: 01/17/2019 4:50:58 PM By: Harold Barban Entered By: Harold Barban on 01/17/2019 10:25:50

## 2019-01-24 ENCOUNTER — Other Ambulatory Visit: Payer: Self-pay

## 2019-01-24 ENCOUNTER — Encounter: Payer: Medicare Other | Admitting: Physician Assistant

## 2019-01-24 NOTE — Progress Notes (Signed)
[name], [name] ([id number]) Visit Report for 01/24/2019 Arrival Information Details Patient Name: LAQUANDRA, CARRILLO Date of Service: 01/24/2019 9:15 AM Medical Record Number: [id number] Patient Account Number: [id number] Date of Birth/Sex: 05-Dec-1950 (68 y.o. F) Treating RN: Montey Hora Primary Care Maliek Schellhorn: Harrel Lemon Other Clinician: Referring Damauri Minion: Harrel Lemon Treating Jacek Colson/Extender: Melburn Hake, HOYT Weeks in Treatment: 9 Visit Information History Since Last Visit Added or deleted any medications: No Patient Arrived: Wheel Chair Any new allergies or adverse reactions: No Arrival Time: 09:25 Had a fall or experienced change in No Accompanied By: caregiver activities of daily living that may affect Transfer Assistance: Manual risk of falls: Patient Identification Verified: Yes Signs or symptoms of abuse/neglect since last visito No Secondary Verification Process Completed: Yes Hospitalized since last visit: No Patient Requires Transmission-Based No Implantable device outside of the clinic excluding No Precautions: cellular tissue based products placed in the center Patient Has Alerts: Yes since last visit: Patient Alerts: NOT Has Dressing in Place as Prescribed: Yes Diabetic Pain Present Now: No Electronic Signature(s) Signed: 01/24/2019 3:25:11 PM By: Montey Hora Entered By: Montey Hora on 01/24/2019 09:26:26 [name] ([id number]) -------------------------------------------------------------------------------- Clinic Level of Care Assessment Details Patient Name: [name] Date of Service: 01/24/2019 9:15 AM Medical Record Number: [id number] Patient Account Number: [id number] Date of Birth/Sex: [birth year] (68 y.o. F) Treating RN: Army Melia Primary Care Mariska Daffin: Harrel Lemon Other Clinician: Referring Reinette Cuneo: Harrel Lemon Treating Jakarius Flamenco/Extender: Melburn Hake, HOYT Weeks in Treatment: 9 Clinic Level of Care Assessment  Items TOOL 4 Quantity Score []  - Use when only an EandM is performed on FOLLOW-UP visit 0 ASSESSMENTS - Nursing Assessment / Reassessment X - Reassessment of Co-morbidities (includes updates in patient status) 1 10 X- 1 5 Reassessment of Adherence to Treatment Plan ASSESSMENTS - Wound and Skin Assessment / Reassessment []  - Simple Wound Assessment / Reassessment - one wound 0 X- 4 5 Complex Wound Assessment / Reassessment - multiple wounds []  - 0 Dermatologic / Skin Assessment (not related to wound area) ASSESSMENTS - Focused Assessment []  - Circumferential Edema Measurements - multi extremities 0 []  - 0 Nutritional Assessment / Counseling / Intervention []  - 0 Lower Extremity Assessment (monofilament, tuning fork, pulses) []  - 0 Peripheral Arterial Disease Assessment (using hand held doppler) ASSESSMENTS - Ostomy and/or Continence Assessment and Care []  - Incontinence Assessment and Management 0 []  - 0 Ostomy Care Assessment and Management (repouching, etc.) PROCESS - Coordination of Care X - Simple Patient / Family Education for ongoing care 1 15 []  - 0 Complex (extensive) Patient / Family Education for ongoing care []  - 0 Staff obtains Programmer, systems, Records, Test Results / Process Orders []  - 0 Staff telephones HHA, Nursing Homes / Clarify orders / etc []  - 0 Routine Transfer to another Facility (non-emergent condition) []  - 0 Routine Hospital Admission (non-emergent condition) []  - 0 New Admissions / Biomedical engineer / Ordering NPWT, Apligraf, etc. []  - 0 Emergency Hospital Admission (emergent condition) X- 1 10 Simple Discharge Coordination Bier, Aashi ([id number]) []  - 0 Complex (extensive) Discharge Coordination PROCESS - Special Needs []  - Pediatric / Minor Patient Management 0 []  - 0 Isolation Patient Management []  - 0 Hearing / Language / Visual special needs []  - 0 Assessment of Community assistance (transportation, D/C planning, etc.) []  -  0 Additional assistance / Altered mentation []  - 0 Support Surface(s) Assessment (bed, cushion, seat, etc.) INTERVENTIONS - Wound Cleansing / Measurement []  - Simple Wound Cleansing - one wound 0 X- 4 5 Complex Wound Cleansing -  multiple wounds X- 1 5 Wound Imaging (photographs - any number of wounds) []  - 0 Wound Tracing (instead of photographs) []  - 0 Simple Wound Measurement - one wound X- 4 5 Complex Wound Measurement - multiple wounds INTERVENTIONS - Wound Dressings []  - Small Wound Dressing one or multiple wounds 0 X- 4 15 Medium Wound Dressing one or multiple wounds []  - 0 Large Wound Dressing one or multiple wounds []  - 0 Application of Medications - topical []  - 0 Application of Medications - injection INTERVENTIONS - Miscellaneous []  - External ear exam 0 []  - 0 Specimen Collection (cultures, biopsies, blood, body fluids, etc.) []  - 0 Specimen(s) / Culture(s) sent or taken to Lab for analysis []  - 0 Patient Transfer (multiple staff / Civil Service fast streamer / Similar devices) []  - 0 Simple Staple / Suture removal (25 or less) []  - 0 Complex Staple / Suture removal (26 or more) []  - 0 Hypo / Hyperglycemic Management (close monitor of Blood Glucose) []  - 0 Ankle / Brachial Index (ABI) - do not check if billed separately X- 1 5 Vital Signs Henderson, Timmi ([id number]) Has the patient been seen at the hospital within the last three years: Yes Total Score: 170 Level Of Care: New/Established - Level 5 Electronic Signature(s) Signed: 01/24/2019 11:33:33 AM By: Army Melia Entered By: Army Melia on 01/24/2019 10:12:50 [name] ([id number]) -------------------------------------------------------------------------------- Encounter Discharge Information Details Patient Name: [name] Date of Service: 01/24/2019 9:15 AM Medical Record Number: [id number] Patient Account Number: [id number] Date of Birth/Sex: Apr 17, 1950 (68 y.o. F) Treating RN: Army Melia Primary Care Armida Vickroy: Harrel Lemon Other Clinician: Referring Iyesha Such: Harrel Lemon Treating Zohar Maroney/Extender: Melburn Hake, HOYT Weeks in Treatment: 9 Encounter Discharge Information Items Discharge Condition: Stable Ambulatory Status: Wheelchair Discharge Destination: Home Transportation: Private Auto Accompanied By: caregiver Schedule Follow-up Appointment: Yes Clinical Summary of Care: Electronic Signature(s) Signed: 01/24/2019 11:33:33 AM By: Army Melia Entered By: Army Melia on 01/24/2019 10:13:48 [place] ([id number]) -------------------------------------------------------------------------------- Lower Extremity Assessment Details Patient Name: [name] Date of Service: 01/24/2019 9:15 AM Medical Record Number: [id number] Patient Account Number: [id number] Date of Birth/Sex: 08-04-1950 (68 y.o. F) Treating RN: Montey Hora Primary Care Alfonza Toft: Harrel Lemon Other Clinician: Referring Tyquarius Paglia: Harrel Lemon Treating Doniqua Saxby/Extender: Melburn Hake, HOYT Weeks in Treatment: 9 Electronic Signature(s) Signed: 01/24/2019 3:25:11 PM By: Montey Hora Entered By: Montey Hora on 01/24/2019 09:27:05 MILAROSE, SAVICH ([id number]) -------------------------------------------------------------------------------- Multi Wound Chart Details Patient Name: [name] Date of Service: 01/24/2019 9:15 AM Medical Record Number: [id number] Patient Account Number: [id number] Date of Birth/Sex: 08-23-1950 (68 y.o. 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)] [2:Incontinence Associated Dermatitis (IAD)] [3:Incontinence Associated Dermatitis (IAD)] Secondary Etiology: [1:Pressure Ulcer] [2:N/A] [3:N/A] Comorbid History: [1:Dementia, Neuropathy] [2:N/A] [3:N/A] Date Acquired: [1:10/16/2018] [2:11/29/2018] [3:11/29/2018] Weeks of Treatment: [1:9] [2:8] [3:8] Wound Status: [1:Open] [2:Open] [3:Converted] Clustered Wound: [1:No] [2:No] [3:Yes] Clustered Quantity: [1:N/A] [2:N/A] [3:N/A] Measurements L x W x D [1:2.5x8x0.1] [2:0.4x1x0.1] [3:N/A] (cm) Area (cm) : [1:15.708] [2:0.314] [3:N/A] Volume (cm) : [1:1.571] [2:0.031] [3:N/A] % Reduction in Area: [1:-601.90%] [2:63.00%] [3:N/A] % Reduction in Volume: [1:-601.30%] [2:63.50%] [3:N/A] Classification: [  1:Full Thickness Without Exposed Support Structures] [2:Full Thickness Without Exposed Support Structures] [3:Full Thickness Without Exposed Support Structures] Exudate Amount: [1:Medium] [2:N/A] [3:N/A] Exudate Type: [1:Serosanguineous] [2:N/A] [3:N/A] Exudate Color: [1:red, brown] [2:N/A] [3:N/A] Wound Margin: [1:Flat and Intact] [2:N/A] [3:N/A] Granulation Amount: [1:Large (67-100%)] [2:N/A] [3:N/A] Granulation Quality: [1:Red] [2:N/A] [3:N/A] Necrotic Amount: [1:None Present (0%)] [2:N/A] [3:N/A] Exposed Structures: [1:Fat Layer (Subcutaneous Tissue) Exposed: Yes Fascia: No Tendon: No Muscle: No Joint: No Bone: No None] [2:N/A N/A] [3:N/A N/A] Wound Number: 4 5 6  JIMI, GIZA ([id number]) Photos: No Photos No Photos No Photos Wound Location: Left Upper Leg Left Gluteus Right Upper Leg Wounding Event: Gradually Appeared Gradually Appeared Gradually Appeared Primary Etiology: MASD Trauma, Other MASD Secondary Etiology: N/A N/A N/A Comorbid History: Dementia, Neuropathy N/A Dementia,  Neuropathy Date Acquired: 12/31/2018 12/24/2018 01/24/2019 Weeks of Treatment: 3 3 0 Wound Status: Open Converted Open Clustered Wound: Yes No Yes Clustered Quantity: 2 N/A 2 Measurements L x W x D 0.5x0.5x0.1 N/A 0.4x0.7x0.1 (cm) Area (cm) : 0.196 N/A 0.22 Volume (cm) : 0.02 N/A 0.022 % Reduction in Area: 84.40% N/A N/A % Reduction in Volume: 84.10% N/A N/A Classification: Partial Thickness Partial Thickness Full Thickness Without Exposed Support Structures Exudate Amount: Medium N/A Medium Exudate Type: Serosanguineous N/A Serous Exudate Color: red, brown N/A amber Wound Margin: Flat and Intact N/A Flat and Intact Granulation Amount: Large (67-100%) N/A Large (67-100%) Granulation Quality: Red N/A Pink Necrotic Amount: None Present (0%) N/A None Present (0%) Exposed Structures: Fat Layer (Subcutaneous N/A Fat Layer (Subcutaneous Tissue) Exposed: Yes Tissue) Exposed: Yes Fascia: No Fascia: No Tendon: No Tendon: No Muscle: No Muscle: No Joint: No Joint: No Bone: No Bone: No Epithelialization: Small (1-33%) N/A None Treatment Notes Electronic Signature(s) Signed: 01/24/2019 11:33:33 AM By: Army Melia Entered By: Army Melia on 01/24/2019 10:09:12 [name] ([id number]) -------------------------------------------------------------------------------- Multi-Disciplinary Care Plan Details Patient Name: [name] Date of Service: 01/24/2019 9:15 AM Medical Record Number: [id number] Patient Account Number: [id number] Date of Birth/Sex: 1950/08/17 (68 y.o. F) Treating RN: Army Melia Primary Care Rexton Greulich: Harrel Lemon Other Clinician: Referring Muath Hallam: Harrel Lemon Treating Jocsan Mcginley/Extender: Melburn Hake, HOYT Weeks in Treatment: 9 Active Inactive Abuse / Safety / Falls / Self Care Management Nursing Diagnoses: Potential for falls Goals: Patient/caregiver will demonstrate safe use of adaptive devices to increase mobility Date Initiated:  11/22/2018 Target Resolution Date: 12/21/2018 Goal Status: Active Interventions: Assess: immobility, friction, shearing, incontinence upon admission and as needed Notes: Orientation to the Wound Care Program Nursing Diagnoses: Knowledge deficit related to the wound healing center program Goals: Patient/caregiver will verbalize understanding of the [place] Program Date Initiated: 11/22/2018 Target Resolution Date: 12/21/2018 Goal Status: Active Interventions: Provide education on orientation to the wound center Notes: Pressure Nursing Diagnoses: Knowledge deficit related to management of pressures ulcers Goals: Patient/caregiver will verbalize risk factors for pressure ulcer development Date Initiated: 11/22/2018 Target Resolution Date: 12/21/2018 Goal Status: Active Interventions: Assess: immobility, friction, shearing, incontinence upon admission and as needed IVER, FEHRENBACH ([id number]) Notes: Wound/Skin Impairment Nursing Diagnoses: Impaired tissue integrity Goals: Ulcer/skin breakdown will have a volume reduction of 30% by week 4 Date Initiated: 11/22/2018 Target Resolution Date: 12/21/2018 Goal Status: Active Interventions: Assess ulceration(s) every visit Notes: Electronic Signature(s) Signed: 01/24/2019 11:33:33 AM By: Army Melia Entered By: Army Melia on 01/24/2019 10:09:01 [place] ([id number]) -------------------------------------------------------------------------------- Pain Assessment Details Patient Name: [name] Date of Service: 01/24/2019 9:15 AM Medical Record Number: [id number] Patient Account Number: [id number] Date of Birth/Sex: [birth year] (68 y.o. F) Treating RN: Marjory Lies,  Di Kindle Primary Care Kamya Watling: Harrel Lemon Other Clinician: Referring Rondle Lohse: Harrel Lemon Treating Leilyn Frayre/Extender: Melburn Hake, HOYT Weeks in Treatment: 9 Active Problems Location of Pain Severity and Description of Pain Patient Has Paino  No Site Locations Pain Management and Medication Current Pain Management: Electronic Signature(s) Signed: 01/24/2019 3:25:11 PM By: Montey Hora Entered By: Montey Hora on 01/24/2019 09:26:32 [name] ([id number]) -------------------------------------------------------------------------------- Patient/Caregiver Education Details Patient Name: [name] Date of Service: 01/24/2019 9:15 AM Medical Record Number: [id number] Patient Account Number: [id number] Date of Birth/Gender: 06-24-1950 (68 y.o. F) Treating RN: Army Melia Primary Care Physician: Harrel Lemon Other Clinician: Referring Physician: Harrel Lemon Treating Physician/Extender: Sharalyn Ink in Treatment: 9 Education Assessment Education Provided To: Patient Education Topics Provided Wound/Skin Impairment: Handouts: Caring for Your Ulcer Methods: Demonstration, Explain/Verbal Responses: State content correctly Electronic Signature(s) Signed: 01/24/2019 11:33:33 AM By: Army Melia Entered By: Army Melia on 01/24/2019 10:13:17 [place] ([id number]) -------------------------------------------------------------------------------- Wound Assessment Details Patient Name: [name] Date of Service: 01/24/2019 9:15 AM Medical Record Number: [id number] Patient Account Number: [id number] Date of Birth/Sex: [birth year] (68 y.o. F) Treating RN: Montey Hora Primary Care Marah Park: Harrel Lemon Other Clinician: Referring Eyden Dobie: Harrel Lemon Treating Adain Geurin/Extender: Melburn Hake, HOYT Weeks in Treatment: 9 Wound Status Wound Number: 1 Primary Etiology: Incontinence Associated Dermatitis (IAD) Wound Location: Left Gluteus Secondary Etiology: Pressure Ulcer Wounding Event: Gradually Appeared Wound Status: Open Date Acquired: 10/16/2018 Comorbid History: Dementia, Neuropathy Weeks Of Treatment: 9 Clustered Wound: No Wound Measurements Length: (cm) 2.5 Width: (cm)  8 Depth: (cm) 0.1 Area: (cm) 15.708 Volume: (cm) 1.571 % Reduction in Area: -601.9% % Reduction in Volume: -601.3% Epithelialization: None Tunneling: No Undermining: No Wound Description Full Thickness Without Exposed Support Classification: Structures Wound Margin: Flat and Intact Exudate Medium Amount: Exudate Type: Serosanguineous Exudate Color: red, brown Foul Odor After Cleansing: No Slough/Fibrino Yes Wound Bed Granulation Amount: Large (67-100%) Exposed Structure Granulation Quality: Red Fascia Exposed: No Necrotic Amount: None Present (0%) Fat Layer (Subcutaneous Tissue) Exposed: Yes Tendon Exposed: No Muscle Exposed: No Joint Exposed: No Bone Exposed: No Treatment Notes Wound #1 (Left Gluteus) Notes Silver cell, BFD Electronic Signature(s) Signed: 01/24/2019 3:25:11 PM By: Montey Hora Entered By: Montey Hora on 01/24/2019 09:38:36 [place], [name] ([id number]) -------------------------------------------------------------------------------- Wound Assessment Details Patient Name: [name] Date of Service: 01/24/2019 9:15 AM Medical Record Number: [id number] Patient Account Number: [id number] Date of Birth/Sex: Jul 31, 1950 (68 y.o. F) Treating RN: Montey Hora Primary Care Luzmaria Devaux: Harrel Lemon Other Clinician: Referring Sheyanne Munley: Harrel Lemon Treating Jalik Gellatly/Extender: Melburn Hake, HOYT Weeks in Treatment: 9 Wound Status Wound Number: 2 Primary Etiology: Incontinence Associated Dermatitis (IAD) Wound Location: Right Gluteal fold Wound Status: Open Wounding Event: Gradually Appeared Date Acquired: 11/29/2018 Weeks Of Treatment: 8 Clustered Wound: No Wound Measurements Length: (cm) 0.4 Width: (cm) 1 Depth: (cm) 0.1 Area: (cm) 0.314 Volume: (cm) 0.031 % Reduction in Area: 63% % Reduction in Volume: 63.5% Wound Description Full Thickness Without Exposed Support Classification: Structures Treatment Notes Wound #2 (Right  Gluteal fold) Notes Silver cell, BFD Electronic Signature(s) Signed: 01/24/2019 3:25:11 PM By: Montey Hora Entered By: Montey Hora on 01/24/2019 09:37:19 [place], [name] ([id number]) -------------------------------------------------------------------------------- Wound Assessment Details Patient Name: [name] Date of Service: 01/24/2019 9:15 AM Medical Record Number: [id number] Patient Account Number: [id number] Date of Birth/Sex: [birth year] (68 y.o. F) Treating RN: Montey Hora Primary Care Conall Vangorder: Harrel Lemon Other Clinician: Referring Shaylea Ucci: Harrel Lemon Treating Ashlye Oviedo/Extender: Melburn Hake, HOYT Weeks in Treatment: 9 Wound Status Wound Number: 3 Primary Etiology: Incontinence Associated Dermatitis (IAD)  Wound Location: Right, Medial Upper Leg Wound Status: Converted Wounding Event: Gradually Appeared Date Acquired: 11/29/2018 Weeks Of Treatment: 8 Clustered Wound: Yes Wound Description Full Thickness Without Exposed Support Classification: Structures Electronic Signature(s) Signed: 01/24/2019 3:25:11 PM By: Montey Hora Entered By: Montey Hora on 01/24/2019 09:37:19 [place], [name] ([id number]) -------------------------------------------------------------------------------- Wound Assessment Details Patient Name: [name] Date of Service: 01/24/2019 9:15 AM Medical Record Number: [id number] Patient Account Number: [id number] Date of Birth/Sex: 1950/06/29 (68 y.o. 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 Wound Status: Open Wounding Event: Gradually Appeared Comorbid History: Dementia, Neuropathy Date Acquired: 12/31/2018 Weeks Of Treatment: 3 Clustered Wound: Yes Wound Measurements Length: (cm)  0.5 Width: (cm) 0.5 Depth: (cm) 0.1 Clustered Quantity: 2 Area: (cm) 0.196 Volume: (cm) 0.02 % Reduction in Area: 84.4% % Reduction in Volume: 84.1% Epithelialization: Small (1-33%) Tunneling: No Undermining: No Wound Description Classification: Partial Thickness Foul O Wound Margin: Flat and Intact Slough Exudate Amount: Medium Exudate Type: Serosanguineous Exudate Color: red, brown dor After Cleansing: No /Fibrino Yes Wound Bed Granulation Amount: Large (67-100%) Exposed Structure Granulation Quality: Red Fascia Exposed: No Necrotic Amount: None Present (0%) Fat Layer (Subcutaneous Tissue) Exposed: Yes Tendon Exposed: No Muscle Exposed: No Joint Exposed: No Bone Exposed: No Treatment Notes Wound #4 (Left Upper Leg) Notes Silver cell, BFD Electronic Signature(s) Signed: 01/24/2019 3:25:11 PM By: Montey Hora Entered By: Montey Hora on 01/24/2019 09:38:52 [place], [name] ([id number]) -------------------------------------------------------------------------------- Wound Assessment Details Patient Name: [name] Date of Service: 01/24/2019 9:15 AM Medical Record Number: [id number] Patient Account Number: [id number] Date of Birth/Sex: [birth year] (68 y.o. F) Treating RN: Montey Hora Primary Care Colt Martelle: Harrel Lemon Other Clinician: Referring Raidon Swanner: Harrel Lemon Treating Ece Cumberland/Extender: STONE III, HOYT Weeks in Treatment: 9 Wound Status Wound Number: 5 Primary Etiology: Trauma, Other Wound Location: Left Gluteus Wound Status: Converted Wounding Event: Gradually Appeared Date Acquired: 12/24/2018 Weeks Of Treatment: 3 Clustered Wound: No Wound Description Classification: Partial Thickness Electronic Signature(s) Signed: 01/24/2019 3:25:11 PM By: Montey Hora Entered By: Montey Hora on 01/24/2019 09:37:19 [place], [name] ([id number]) -------------------------------------------------------------------------------- Wound  Assessment Details Patient Name: [name] Date of Service: 01/24/2019 9:15 AM Medical Record Number: [id number] Patient Account Number: [id number] Date of Birth/Sex: September 11, 1950 (68 y.o. F) Treating RN: Montey Hora Primary Care Cary Wilford: Harrel Lemon Other Clinician: Referring Makynzie Dobesh: Harrel Lemon Treating Salah Burlison/Extender: Melburn Hake, HOYT Weeks in Treatment: 9 Wound Status Wound Number: 6 Primary Etiology: MASD Wound Location: Right Upper Leg Wound Status: Open Wounding Event: Gradually Appeared Comorbid History: Dementia, Neuropathy Date Acquired: 01/24/2019 Weeks Of Treatment: 0 Clustered Wound: Yes Wound Measurements Length: (cm) 0.4 Width: (cm) 0.7 Depth: (cm) 0.1 Clustered Quantity: 2 Area: (cm) 0.22 Volume: (cm) 0.022 % Reduction in Area: % Reduction in Volume: Epithelialization: None Tunneling: No Undermining: No Wound Description Full Thickness Without Exposed Support Foul O Classification: Structures Slough Wound Margin: Flat and Intact Exudate Medium Amount: Exudate Type: Serous Exudate Color: amber dor After Cleansing: No /Fibrino No Wound Bed Granulation Amount: Large (67-100%) Exposed Structure Granulation Quality: Pink Fascia Exposed: No Necrotic Amount: None Present (0%) Fat Layer (Subcutaneous Tissue) Exposed: Yes Tendon Exposed: No Muscle Exposed: No Joint Exposed: No Bone Exposed: No Treatment Notes Wound #6 (Right Upper Leg) Notes Silver cell, BFD Electronic Signature(s) Signed: 01/24/2019 3:25:11 PM By: Montey Hora Entered By: Montey Hora on 01/24/2019 09:38:22 [place], Giah ([id number]) --------------------------------------------------------------------------------  Vitals Details Patient Name: AMAL, RENBARGER Date of Service: 01/24/2019 9:15 AM Medical Record Number: [id number] Patient Account Number: [id number] Date of Birth/Sex: [birth year] (68 y.o. F) Treating RN: Montey Hora Primary Care  Lam Mccubbins: Harrel Lemon Other Clinician: Referring Jeree Delcid: Harrel Lemon Treating Karina Lenderman/Extender: Melburn Hake, HOYT Weeks in Treatment: 9 Vital Signs Time Taken: 09:26 Temperature (F): 97.8 Height (in): 59 Pulse (bpm): 51 Weight (lbs): 193 Respiratory Rate (breaths/min): 16 Body Mass Index (BMI): 39 Blood Pressure (mmHg): 149/66 Reference Range: 80 - 120 mg / dl Electronic Signature(s) Signed: 01/24/2019 3:25:11 PM By: Montey Hora Entered By: Montey Hora on 01/24/2019 09:26:55

## 2019-01-24 NOTE — Progress Notes (Addendum)
MAILA, [name] ([id number]) Visit Report for 01/24/2019 Chief Complaint Document Details Patient Name: [name], CRAMER Date of Service: 01/24/2019 9:15 AM Medical Record Number: [id number] Patient Account Number: [id number] Date of Birth/Sex: [birth year] (68 y.o. F) Treating RN: Army Melia Primary Care Provider: Harrel Lemon Other Clinician: Referring Provider: Harrel Lemon Treating Provider/Extender: Melburn Hake, HOYT Weeks in Treatment: 9 Information Obtained from: Patient Chief Complaint Left gluteal fold ulcer Electronic Signature(s) Signed: 01/24/2019 9:37:36 AM By: Worthy Keeler PA-C Entered By: Worthy Keeler on 01/24/2019 09:37:35 [place], [name] ([id number]) -------------------------------------------------------------------------------- HPI Details Patient Name: [name] Date of Service: 01/24/2019 9:15 AM Medical Record Number: [id number] Patient Account Number: [id number] Date of Birth/Sex: 19-Oct-1950 (68 y.o. F) Treating RN: Army Melia Primary Care Provider: Harrel Lemon Other Clinician: Referring Provider: Harrel Lemon Treating Provider/Extender: Melburn Hake, HOYT Weeks in Treatment: 9 History of Present Illness HPI Description: 11/22/2018 patient presents today for initial evaluation in our clinic concerning issues that she has been having with a wound in the left gluteal fold. This has been present since August and was noted initially when she was in the hospital. Fortunately there is no signs of active infection at this time. No fever chills noted. Up to this point DuoDERM has been utilized although I am not sure that is the best treatment option for her based on what I am seeing currently. This actually started as a result of moisture associated skin damage secondary to a C. difficile infection. The patient does have dementia. She is also status post having had a kidney transplant and does have hypertension as well. She does spend a lot of her  time during the day sitting which is probably a part of why this is having trouble healing although it is not really a pressure ulcer that may be contributing to the lack of significant and rapid improvement. She still is having diarrhea as well and is still on treatment for the C. difficile infection she is on Dificid. They have been using barrier cream in the gluteal region to try to help protect the area where the dressing was not. 11/29/2018 upon evaluation today patient does not appear to be doing significantly better at this time. She still had some somewhat softer stools but not really as much diarrhea as what she had when the C. difficile infection was not full cycle. Overall things seem to be doing better in that regard although she does have 2 new areas on evaluation today. There is no signs of active infection. No fevers, chills, nausea, vomiting, or diarrhea. 12/06/2018 on evaluation today patient actually appears to be doing better with regard to the wounds in the gluteal region in general. Overall the biggest issue has been that apparently one of the wounds was missed by the home health nurse and not noted to be open and its been getting stuck to her brief. Nonetheless other than that and even with that considered she still seems to actually be doing quite well as far as I am seeing today. 12/20/2018 on evaluation today patient appears to be doing very well in regard to her wounds. Fortunately there is no sign of active infection at this time which is great news. She has been tolerating the dressing changes without complication although [facility] home [facility] is no longer coming out they just discontinued the patient's treatment all of a sudden standing that they were complete as far as what they could do. Nonetheless unfortunately the patient still has wounds open and has not had any  dressings to put on the area they have been doing the best they could until they came in here today. There  is no evidence of infection and things seem to be doing quite well. 01/03/2019 on evaluation today patient appears to be doing okay in some regards regarding her wounds. Specifically the main wound on the left gluteal fold in the right gluteal fold seems to be doing much better. The right gluteal fold in fact is pretty much healed. On the left this still has a little bit deeper area open at this point I think that still needs a bandage. Unfortunately she got different bandages than the silicone, Allevyn, bandages that we had recommended and ordered for her. For that reason she unfortunately does have some additional breakdown around where the bandages have been pulling at this time. This has caused several areas of skin breakdown unfortunately. Fortunately there is no signs of active infection at this time. No fevers, chills, nausea, vomiting, or diarrhea. 01/17/2019 on evaluation today patient appears to be having a lot of issues with skin tearing due to adhesive's from the border foam dressing that was sent from the medical supplier. With that being said we specifically ordered Allevyn dressings I am not sure why they sent the other and less it has a insurance issue with not actually covering the dressings that they feel like that is better for her. With that being said there does not appear to be any signs of active infection at this point which is good news. Nonetheless she is still having pain. She is seen with a caregiver here today. 01/24/2019 on evaluation today patient appears to be doing well with regard to her wounds. In fact she is significantly improved as compared to last week's evaluation. Having the right dressings, Allevyn, has been of utmost importance for her this does not seem to damage her skin which is excellent news. Overall the patient also does not seem to be having as much pain. She again is seen with her caregiver during the office visit today and her caregiver is very  pleased with how things seem to be progressing with the proper treatments. [name], [name] ([id number]) Electronic Signature(s) Signed: 01/24/2019 10:26:09 AM By: Worthy Keeler PA-C Entered By: Worthy Keeler on 01/24/2019 10:26:08 AMBRY, [name] ([id number]) -------------------------------------------------------------------------------- Physical Exam Details Patient Name: [name] Date of Service: 01/24/2019 9:15 AM Medical Record Number: [id number] Patient Account Number: [id number] Date of Birth/Sex: 21-Sep-1950 (68 y.o. F) Treating RN: Army Melia Primary Care Provider: Harrel Lemon Other Clinician: Referring Provider: Harrel Lemon Treating Provider/Extender: Melburn Hake, HOYT Weeks in Treatment: 9 Constitutional Well-nourished and well-hydrated in no acute distress. Respiratory normal breathing without difficulty. clear to auscultation bilaterally. Cardiovascular regular rate and rhythm with normal S1, S2. Psychiatric Patient is not able to cooperate in decision making regarding care. Patient has dementia. pleasant and cooperative. Notes Patient's wound bed currently showed signs of good granulation at this time and epithelization as well. Overall she seems to be doing much better and I am very pleased with the fact again that the patient is dramatically improved even compared to last week. Electronic Signature(s) Signed: 01/24/2019 10:26:50 AM By: Worthy Keeler PA-C Entered By: Worthy Keeler on 01/24/2019 10:26:50 [place] ([id number]) -------------------------------------------------------------------------------- Physician Orders Details Patient Name: [name] Date of Service: 01/24/2019 9:15 AM Medical Record Number: [id number] Patient Account Number: [id number] Date of Birth/Sex: [birth year] (68 y.o. F) Treating RN: Army Melia Primary Care Provider: Harrel Lemon Other Clinician: Referring Provider:  Harrel Lemon Treating  Provider/Extender: Melburn Hake, HOYT Weeks in Treatment: 9 Verbal / Phone Orders: No Diagnosis Coding ICD-10 Coding Code Description L24.89 Irritant contact dermatitis due to other agents F03.90 Unspecified dementia without behavioral disturbance Z94.0 Kidney transplant status I10 Essential (primary) hypertension Wound Cleansing Wound #1 Left Gluteus o Clean wound with Normal Saline. - in office o Cleanse wound with mild soap and water Wound #2 Right Gluteal fold o Clean wound with Normal Saline. - in office o Cleanse wound with mild soap and water Wound #4 Left Upper Leg o Clean wound with Normal Saline. - in office o Cleanse wound with mild soap and water Wound #6 Right Upper Leg o Clean wound with Normal Saline. - in office o Cleanse wound with mild soap and water Skin Barriers/Peri-Wound Care Wound #1 Left Gluteus o Skin Prep - periwound Wound #2 Right Gluteal fold o Skin Prep - periwound Wound #4 Left Upper Leg o Skin Prep - periwound Wound #6 Right Upper Leg o Skin Prep - periwound Primary Wound Dressing Wound #1 Left Gluteus o Silver Alginate Wound #2 Right Gluteal fold o Silver Alginate [place], Rakayla ([id number]) Wound #4 Left Upper Leg o Silver Alginate Wound #6 Right Upper Leg o Silver Alginate Secondary Dressing Wound #1 Left Gluteus o Boardered Foam Dressing Wound #2 Right Gluteal fold o Boardered Foam Dressing Wound #4 Left Upper Leg o Boardered Foam Dressing Wound #6 Right Upper Leg o Boardered Foam Dressing Dressing Change Frequency Wound #1 Left Gluteus o Change dressing every other day. - as needed for soilage Wound #2 Right Gluteal fold o Change dressing every other day. - as needed for soilage Wound #4 Left Upper Leg o Change dressing every other day. - as needed for soilage Wound #6 Right Upper Leg o Change dressing every other day. - as needed for soilage Follow-up Appointments Wound #1  Left Gluteus o Return Appointment in 1 week. Wound #2 Right Gluteal fold o Return Appointment in 1 week. Wound #4 Left Upper Leg o Return Appointment in 1 week. Wound #6 Right Upper Leg o Return Appointment in 1 week. Off-Loading Wound #1 Left Gluteus o Turn and reposition every 2 hours o Other: - memory foam cushion while siting Wound #2 Right Gluteal fold o Turn and reposition every 2 hours o Other: - memory foam cushion while siting Wound #4 Left Upper Leg [name], [name] ([id number]) o Turn and reposition every 2 hours o Other: - memory foam cushion while siting Wound #6 Right Upper Leg o Turn and reposition every 2 hours o Other: - memory foam cushion while Sports administrator Signature(s) Signed: 01/24/2019 11:33:33 AM By: Army Melia Signed: 01/24/2019 6:18:38 PM By: Worthy Keeler PA-C Entered By: Army Melia on 01/24/2019 10:11:53 [place] ([id number]) -------------------------------------------------------------------------------- Problem List Details Patient Name: [name] Date of Service: 01/24/2019 9:15 AM Medical Record Number: [id number] Patient Account Number: [id number] Date of Birth/Sex: Jul 06, 1950 (68 y.o. F) Treating RN: Army Melia Primary Care Provider: Harrel Lemon Other Clinician: Referring Provider: Harrel Lemon Treating Provider/Extender: Melburn Hake, HOYT Weeks in Treatment: 9 Active Problems ICD-10 Evaluated Encounter Code Description Active Date Today Diagnosis L24.89 Irritant contact dermatitis due to other agents 11/22/2018 No Yes F03.90 Unspecified dementia without behavioral disturbance 11/22/2018 No Yes Z94.0 Kidney transplant status 11/22/2018 No Yes I10 Essential (primary) hypertension 11/22/2018 No Yes Inactive Problems Resolved Problems Electronic Signature(s) Signed: 01/24/2019 9:37:28 AM By: Worthy Keeler PA-C Entered By: Worthy Keeler on 01/24/2019 09:37:28 [place], Ina  ([id number]) -------------------------------------------------------------------------------- Progress  Note Details Patient Name: ORLANDO, THALMANN Date of Service: 01/24/2019 9:15 AM Medical Record Number: [id number] Patient Account Number: [id number] Date of Birth/Sex: [birth year] (68 y.o. F) Treating RN: Army Melia Primary Care Provider: Harrel Lemon Other Clinician: Referring Provider: Harrel Lemon Treating Provider/Extender: Melburn Hake, HOYT Weeks in Treatment: 9 Subjective Chief Complaint Information obtained from Patient Left gluteal fold ulcer History of Present Illness (HPI) 11/22/2018 patient presents today for initial evaluation in our clinic concerning issues that she has been having with a wound in the left gluteal fold. This has been present since August and was noted initially when she was in the hospital. Fortunately there is no signs of active infection at this time. No fever chills noted. Up to this point DuoDERM has been utilized although I am not sure that is the best treatment option for her based on what I am seeing currently. This actually started as a result of moisture associated skin damage secondary to a C. difficile infection. The patient does have dementia. She is also status post having had a kidney transplant and does have hypertension as well. She does spend a lot of her time during the day sitting which is probably a part of why this is having trouble healing although it is not really a pressure ulcer that may be contributing to the lack of significant and rapid improvement. She still is having diarrhea as well and is still on treatment for the C. difficile infection she is on Dificid. They have been using barrier cream in the gluteal region to try to help protect the area where the dressing was not. 11/29/2018 upon evaluation today patient does not appear to be doing significantly better at this time. She still had some somewhat softer stools but not  really as much diarrhea as what she had when the C. difficile infection was not full cycle. Overall things seem to be doing better in that regard although she does have 2 new areas on evaluation today. There is no signs of active infection. No fevers, chills, nausea, vomiting, or diarrhea. 12/06/2018 on evaluation today patient actually appears to be doing better with regard to the wounds in the gluteal region in general. Overall the biggest issue has been that apparently one of the wounds was missed by the home health nurse and not noted to be open and its been getting stuck to her brief. Nonetheless other than that and even with that considered she still seems to actually be doing quite well as far as I am seeing today. 12/20/2018 on evaluation today patient appears to be doing very well in regard to her wounds. Fortunately there is no sign of active infection at this time which is great news. She has been tolerating the dressing changes without complication although advanced home care is no longer coming out they just discontinued the patient's treatment all of a sudden standing that they were complete as far as what they could do. Nonetheless unfortunately the patient still has wounds open and has not had any dressings to put on the area they have been doing the best they could until they came in here today. There is no evidence of infection and things seem to be doing quite well. 01/03/2019 on evaluation today patient appears to be doing okay in some regards regarding her wounds. Specifically the main wound on the left gluteal fold in the right gluteal fold seems to be doing much better. The right gluteal fold in fact is pretty much healed. On the left  this still has a little bit deeper area open at this point I think that still needs a bandage. Unfortunately she got different bandages than the silicone, Allevyn, bandages that we had recommended and ordered for her. For that reason she  unfortunately does have some additional breakdown around where the bandages have been pulling at this time. This has caused several areas of skin breakdown unfortunately. Fortunately there is no signs of active infection at this time. No fevers, chills, nausea, vomiting, or diarrhea. 01/17/2019 on evaluation today patient appears to be having a lot of issues with skin tearing due to adhesive's from the border foam dressing that was sent from the medical supplier. With that being said we specifically ordered Allevyn dressings I am not sure why they sent the other and less it has a insurance issue with not actually covering the dressings that they feel like that is better for her. With that being said there does not appear to be any signs of active infection at this point which is good news. Nonetheless she is still having pain. She is seen with a caregiver here today. [name], [name] ([id number]) 01/24/2019 on evaluation today patient appears to be doing well with regard to her wounds. In fact she is significantly improved as compared to last week's evaluation. Having the right dressings, Allevyn, has been of utmost importance for her this does not seem to damage her skin which is excellent news. Overall the patient also does not seem to be having as much pain. She again is seen with her caregiver during the office visit today and her caregiver is very pleased with how things seem to be progressing with the proper treatments. Patient History Unable to Obtain Patient History due to Dementia. Information obtained from Patient. Family History Heart Disease - Siblings, No family history of Cancer, Diabetes, Hypertension, Kidney Disease, Lung Disease, Seizures, Stroke, Thyroid Problems, Tuberculosis. Social History Former smoker - Quit 40+ years ago, Marital Status - Married, Alcohol Use - Never, Drug Use - No History, Caffeine Use - Rarely. Medical History Endocrine Denies history of Type I  Diabetes, Type II Diabetes Neurologic Patient has history of Dementia, Neuropathy - hands Oncologic Denies history of Received Chemotherapy, Received Radiation Hospitalization/Surgery History - [facility] UTI. Medical And Surgical History Notes Genitourinary Kidney Transpant 5+ years ago Review of Systems (ROS) Constitutional Symptoms (General Health) Denies complaints or symptoms of Fatigue, Fever, Chills, Marked Weight Change. Respiratory Denies complaints or symptoms of Chronic or frequent coughs, Shortness of Breath. Cardiovascular Denies complaints or symptoms of Chest pain, LE edema. Psychiatric Denies complaints or symptoms of Anxiety, Claustrophobia. Objective Constitutional Well-nourished and well-hydrated in no acute distress. [place], [place] ([id number]) Vitals Time Taken: 9:26 AM, Height: 59 in, Weight: 193 lbs, BMI: 39, Temperature: 97.8 F, Pulse: 51 bpm, Respiratory Rate: 16 breaths/min, Blood Pressure: 149/66 mmHg. Respiratory normal breathing without difficulty. clear to auscultation bilaterally. Cardiovascular regular rate and rhythm with normal S1, S2. Psychiatric Patient is not able to cooperate in decision making regarding care. Patient has dementia. pleasant and cooperative. General Notes: Patient's wound bed currently showed signs of good granulation at this time and epithelization as well. Overall she seems to be doing much better and I am very pleased with the fact again that the patient is dramatically improved even compared to last week. Integumentary (Hair, Skin) Wound #1 status is Open. Original cause of wound was Gradually Appeared. The wound is located on the Left Gluteus. The wound measures 2.5cm length x 8cm width x 0.1cm  depth; 15.708cm^2 area and 1.571cm^3 volume. There is Fat Layer (Subcutaneous Tissue) Exposed exposed. There is no tunneling or undermining noted. There is a medium amount of serosanguineous drainage noted. The wound margin is flat  and intact. There is large (67-100%) red granulation within the wound bed. There is no necrotic tissue within the wound bed. Wound #2 status is Open. Original cause of wound was Gradually Appeared. The wound is located on the Right Gluteal fold. The wound measures 0.4cm length x 1cm width x 0.1cm depth; 0.314cm^2 area and 0.031cm^3 volume. Wound #3 status is Converted. Original cause of wound was Gradually Appeared. The wound is located on the Right,Medial Upper Leg. Wound #4 status is Open. Original cause of wound was Gradually Appeared. The wound is located on the Left Upper Leg. The wound measures 0.5cm length x 0.5cm width x 0.1cm depth; 0.196cm^2 area and 0.02cm^3 volume. There is Fat Layer (Subcutaneous Tissue) Exposed exposed. There is no tunneling or undermining noted. There is a medium amount of serosanguineous drainage noted. The wound margin is flat and intact. There is large (67-100%) red granulation within the wound bed. There is no necrotic tissue within the wound bed. Wound #5 status is Converted. Original cause of wound was Gradually Appeared. The wound is located on the Left Gluteus. Wound #6 status is Open. Original cause of wound was Gradually Appeared. The wound is located on the Right Upper Leg. The wound measures 0.4cm length x 0.7cm width x 0.1cm depth; 0.22cm^2 area and 0.022cm^3 volume. There is Fat Layer (Subcutaneous Tissue) Exposed exposed. There is no tunneling or undermining noted. There is a medium amount of serous drainage noted. The wound margin is flat and intact. There is large (67-100%) pink granulation within the wound bed. There is no necrotic tissue within the wound bed. Assessment Active Problems ICD-10 Irritant contact dermatitis due to other agents Unspecified dementia without behavioral disturbance Kidney transplant status Essential (primary) hypertension [name], [name] ([id number]) Plan Wound Cleansing: Wound #1 Left Gluteus: Clean wound with  Normal Saline. - in office Cleanse wound with mild soap and water Wound #2 Right Gluteal fold: Clean wound with Normal Saline. - in office Cleanse wound with mild soap and water Wound #4 Left Upper Leg: Clean wound with Normal Saline. - in office Cleanse wound with mild soap and water Wound #6 Right Upper Leg: Clean wound with Normal Saline. - in office Cleanse wound with mild soap and water Skin Barriers/Peri-Wound Care: Wound #1 Left Gluteus: Skin Prep - periwound Wound #2 Right Gluteal fold: Skin Prep - periwound Wound #4 Left Upper Leg: Skin Prep - periwound Wound #6 Right Upper Leg: Skin Prep - periwound Primary Wound Dressing: Wound #1 Left Gluteus: Silver Alginate Wound #2 Right Gluteal fold: Silver Alginate Wound #4 Left Upper Leg: Silver Alginate Wound #6 Right Upper Leg: Silver Alginate Secondary Dressing: Wound #1 Left Gluteus: Boardered Foam Dressing Wound #2 Right Gluteal fold: Boardered Foam Dressing Wound #4 Left Upper Leg: Boardered Foam Dressing Wound #6 Right Upper Leg: Boardered Foam Dressing Dressing Change Frequency: Wound #1 Left Gluteus: Change dressing every other day. - as needed for soilage Wound #2 Right Gluteal fold: Change dressing every other day. - as needed for soilage Wound #4 Left Upper Leg: Change dressing every other day. - as needed for soilage Wound #6 Right Upper Leg: Change dressing every other day. - as needed for soilage Follow-up Appointments: Wound #1 Left Gluteus: Return Appointment in 1 week. Wound #2 Right Gluteal fold: Return Appointment in 1  week. [name], [name] ([id number]) Wound #4 Left Upper Leg: Return Appointment in 1 week. Wound #6 Right Upper Leg: Return Appointment in 1 week. Off-Loading: Wound #1 Left Gluteus: Turn and reposition every 2 hours Other: - memory foam cushion while siting Wound #2 Right Gluteal fold: Turn and reposition every 2 hours Other: - memory foam cushion while siting Wound  #4 Left Upper Leg: Turn and reposition every 2 hours Other: - memory foam cushion while siting Wound #6 Right Upper Leg: Turn and reposition every 2 hours Other: - memory foam cushion while siting 1. I would recommend currently that we continue with the current wound care measures which includes the silver alginate dressing to all wound locations covered with the border foam dressing specifically Allevyn. This seems to have done well and does not cause any damage to her skin subsequently all wounds are measuring better today. 2. I would recommend as well that patient continue to change positions frequently which did not seem like they do a great job with her in regard to as well. This will prevent any pressure from causing any breakdown although I really do not see any signs of pressure at this point. We will see patient back for reevaluation in 1 week here in the clinic. If anything worsens or changes patient will contact our office for additional recommendations. Electronic Signature(s) Signed: 01/24/2019 10:27:48 AM By: Worthy Keeler PA-C Entered By: Worthy Keeler on 01/24/2019 10:27:47 [name], [name] ([id number]) -------------------------------------------------------------------------------- ROS/PFSH Details Patient Name: [name] Date of Service: 01/24/2019 9:15 AM Medical Record Number: [id number] Patient Account Number: [id number] Date of Birth/Sex: 12/17/1950 (68 y.o. F) Treating RN: Army Melia Primary Care Provider: Harrel Lemon Other Clinician: Referring Provider: Harrel Lemon Treating Provider/Extender: Melburn Hake, HOYT Weeks in Treatment: 9 Unable to Obtain Patient History due to oo Dementia Information Obtained From Patient Constitutional Symptoms (General Health) Complaints and Symptoms: Negative for: Fatigue; Fever; Chills; Marked Weight Change Respiratory Complaints and Symptoms: Negative for: Chronic or frequent coughs; Shortness of  Breath Cardiovascular Complaints and Symptoms: Negative for: Chest pain; LE edema Psychiatric Complaints and Symptoms: Negative for: Anxiety; Claustrophobia Endocrine Medical History: Negative for: Type I Diabetes; Type II Diabetes Genitourinary Medical History: Past Medical History Notes: Kidney Transpant 5+ years ago Neurologic Medical History: Positive for: Dementia; Neuropathy - hands Oncologic Medical History: Negative for: Received Chemotherapy; Received Radiation Immunizations Pneumococcal Vaccine: Received Pneumococcal Vaccination: Yes [name], [name] ([id number]) Implantable Devices Yes Hospitalization / Surgery History Type of Hospitalization/Surgery [facility] UTI Family and Social History Cancer: No; Diabetes: No; Heart Disease: Yes - Siblings; Hypertension: No; Kidney Disease: No; Lung Disease: No; Seizures: No; Stroke: No; Thyroid Problems: No; Tuberculosis: No; Former smoker - Quit 40+ years ago; Marital Status - Married; Alcohol Use: Never; Drug Use: No History; Caffeine Use: Rarely Physician Affirmation I have reviewed and agree with the above information. Electronic Signature(s) Signed: 01/24/2019 11:33:33 AM By: Army Melia Signed: 01/24/2019 6:18:38 PM By: Worthy Keeler PA-C Entered By: Worthy Keeler on 01/24/2019 10:26:20 [name], [name] ([id number]) -------------------------------------------------------------------------------- SuperBill Details Patient Name: [name] Date of Service: 01/24/2019 Medical Record Number: [id number] Patient Account Number: [id number] Date of Birth/Sex: 05-24-1950 (68 y.o. F) Treating RN: Army Melia Primary Care Provider: Harrel Lemon Other Clinician: Referring Provider: Harrel Lemon Treating Provider/Extender: Melburn Hake, HOYT Weeks in Treatment: 9 Diagnosis Coding ICD-10 Codes Code Description L24.89 Irritant contact dermatitis due to other agents F03.90 Unspecified dementia without behavioral  disturbance Z94.0 Kidney transplant status I10 Essential (primary) hypertension Physician Procedures CPT4  Code: 0488891 Description: 69450 - WC PHYS LEVEL 4 - EST PT ICD-10 Diagnosis Description L24.89 Irritant contact dermatitis due to other agents F03.90 Unspecified dementia without behavioral disturbance Z94.0 Kidney transplant status I10 Essential (primary)  hypertension Modifier: Quantity: 1 Electronic Signature(s) Signed: 01/24/2019 10:28:19 AM By: Worthy Keeler PA-C Entered By: Worthy Keeler on 01/24/2019 10:28:18

## 2019-01-31 ENCOUNTER — Encounter: Payer: Self-pay | Admitting: Emergency Medicine

## 2019-01-31 ENCOUNTER — Observation Stay: Payer: Medicare Other

## 2019-01-31 ENCOUNTER — Inpatient Hospital Stay
Admission: EM | Admit: 2019-01-31 | Discharge: 2019-02-05 | DRG: 698 | Disposition: A | Discharge status: Skilled Nursing Facility | Payer: Medicare Other | Attending: Internal Medicine | Admitting: Internal Medicine

## 2019-01-31 ENCOUNTER — Other Ambulatory Visit: Payer: Self-pay

## 2019-01-31 ENCOUNTER — Ambulatory Visit: Payer: Medicare Other | Admitting: Physician Assistant

## 2019-01-31 DIAGNOSIS — Z91040 Latex allergy status: Secondary | ICD-10-CM

## 2019-01-31 DIAGNOSIS — Z940 Kidney transplant status: Secondary | ICD-10-CM

## 2019-01-31 DIAGNOSIS — Z905 Acquired absence of kidney: Secondary | ICD-10-CM

## 2019-01-31 DIAGNOSIS — D631 Anemia in chronic kidney disease: Secondary | ICD-10-CM | POA: Diagnosis present

## 2019-01-31 DIAGNOSIS — Z6839 Body mass index (BMI) 39.0-39.9, adult: Secondary | ICD-10-CM

## 2019-01-31 DIAGNOSIS — L8992 Pressure ulcer of unspecified site, stage 2: Secondary | ICD-10-CM | POA: Diagnosis present

## 2019-01-31 DIAGNOSIS — Z888 Allergy status to other drugs, medicaments and biological substances status: Secondary | ICD-10-CM

## 2019-01-31 DIAGNOSIS — Z8542 Personal history of malignant neoplasm of other parts of uterus: Secondary | ICD-10-CM

## 2019-01-31 DIAGNOSIS — T8619 Other complication of kidney transplant: Secondary | ICD-10-CM | POA: Diagnosis not present

## 2019-01-31 DIAGNOSIS — Z87891 Personal history of nicotine dependence: Secondary | ICD-10-CM

## 2019-01-31 DIAGNOSIS — F0390 Unspecified dementia without behavioral disturbance: Secondary | ICD-10-CM | POA: Diagnosis present

## 2019-01-31 DIAGNOSIS — I120 Hypertensive chronic kidney disease with stage 5 chronic kidney disease or end stage renal disease: Secondary | ICD-10-CM | POA: Diagnosis present

## 2019-01-31 DIAGNOSIS — Z79890 Hormone replacement therapy (postmenopausal): Secondary | ICD-10-CM

## 2019-01-31 DIAGNOSIS — E872 Acidosis: Secondary | ICD-10-CM | POA: Diagnosis present

## 2019-01-31 DIAGNOSIS — E669 Obesity, unspecified: Secondary | ICD-10-CM | POA: Diagnosis present

## 2019-01-31 DIAGNOSIS — E785 Hyperlipidemia, unspecified: Secondary | ICD-10-CM | POA: Diagnosis not present

## 2019-01-31 DIAGNOSIS — M898X9 Other specified disorders of bone, unspecified site: Secondary | ICD-10-CM | POA: Diagnosis present

## 2019-01-31 DIAGNOSIS — N184 Chronic kidney disease, stage 4 (severe): Secondary | ICD-10-CM

## 2019-01-31 DIAGNOSIS — N186 End stage renal disease: Secondary | ICD-10-CM | POA: Diagnosis present

## 2019-01-31 DIAGNOSIS — N179 Acute kidney failure, unspecified: Secondary | ICD-10-CM | POA: Diagnosis not present

## 2019-01-31 DIAGNOSIS — E875 Hyperkalemia: Secondary | ICD-10-CM | POA: Diagnosis present

## 2019-01-31 DIAGNOSIS — Z87440 Personal history of urinary (tract) infections: Secondary | ICD-10-CM

## 2019-01-31 DIAGNOSIS — N2581 Secondary hyperparathyroidism of renal origin: Secondary | ICD-10-CM | POA: Diagnosis present

## 2019-01-31 DIAGNOSIS — Z79899 Other long term (current) drug therapy: Secondary | ICD-10-CM

## 2019-01-31 DIAGNOSIS — N189 Chronic kidney disease, unspecified: Secondary | ICD-10-CM

## 2019-01-31 DIAGNOSIS — Z791 Long term (current) use of non-steroidal anti-inflammatories (NSAID): Secondary | ICD-10-CM

## 2019-01-31 DIAGNOSIS — L8990 Pressure ulcer of unspecified site, unspecified stage: Secondary | ICD-10-CM | POA: Insufficient documentation

## 2019-01-31 DIAGNOSIS — Z20828 Contact with and (suspected) exposure to other viral communicable diseases: Secondary | ICD-10-CM | POA: Diagnosis present

## 2019-01-31 DIAGNOSIS — I10 Essential (primary) hypertension: Secondary | ICD-10-CM | POA: Diagnosis not present

## 2019-01-31 DIAGNOSIS — K219 Gastro-esophageal reflux disease without esophagitis: Secondary | ICD-10-CM | POA: Diagnosis present

## 2019-01-31 DIAGNOSIS — R251 Tremor, unspecified: Secondary | ICD-10-CM | POA: Diagnosis present

## 2019-01-31 DIAGNOSIS — Z85528 Personal history of other malignant neoplasm of kidney: Secondary | ICD-10-CM

## 2019-01-31 DIAGNOSIS — E860 Dehydration: Secondary | ICD-10-CM | POA: Diagnosis present

## 2019-01-31 LAB — COMPREHENSIVE METABOLIC PANEL
ALT: 10 U/L (ref 0–44)
AST: 13 U/L — ABNORMAL LOW (ref 15–41)
Albumin: 3.6 g/dL (ref 3.5–5.0)
Alkaline Phosphatase: 43 U/L (ref 38–126)
Anion gap: 8 (ref 5–15)
BUN: 66 mg/dL — ABNORMAL HIGH (ref 8–23)
CO2: 17 mmol/L — ABNORMAL LOW (ref 22–32)
Calcium: 9.4 mg/dL (ref 8.9–10.3)
Chloride: 117 mmol/L — ABNORMAL HIGH (ref 98–111)
Creatinine, Ser: 3.1 mg/dL — ABNORMAL HIGH (ref 0.44–1.00)
GFR calc Af Amer: 17 mL/min — ABNORMAL LOW (ref >60)
GFR calc non Af Amer: 15 mL/min — ABNORMAL LOW (ref >60)
Glucose, Bld: 97 mg/dL (ref 70–99)
Potassium: 5.2 mmol/L — ABNORMAL HIGH (ref 3.5–5.1)
Sodium: 142 mmol/L (ref 135–145)
Total Bilirubin: 0.4 mg/dL (ref 0.3–1.2)
Total Protein: 7.4 g/dL (ref 6.5–8.1)

## 2019-01-31 LAB — CBC
HCT: 34.3 % — ABNORMAL LOW (ref 36.0–46.0)
Hemoglobin: 10.3 g/dL — ABNORMAL LOW (ref 12.0–15.0)
MCH: 24.9 pg — ABNORMAL LOW (ref 26.0–34.0)
MCHC: 30 g/dL (ref 30.0–36.0)
MCV: 83.1 fL (ref 80.0–100.0)
Platelets: 167 10*3/uL (ref 150–400)
RBC: 4.13 MIL/uL (ref 3.87–5.11)
RDW: 15.2 % (ref 11.5–15.5)
WBC: 5.1 10*3/uL (ref 4.0–10.5)
nRBC: 0 % (ref 0.0–0.2)

## 2019-01-31 LAB — URINALYSIS, COMPLETE (UACMP) WITH MICROSCOPIC
Bilirubin Urine: NEGATIVE
Glucose, UA: NEGATIVE mg/dL
Ketones, ur: NEGATIVE mg/dL
Nitrite: NEGATIVE
Protein, ur: 100 mg/dL — AB
RBC / HPF: >50 RBC/hpf — ABNORMAL HIGH (ref 0–5)
Specific Gravity, Urine: 1.014 (ref 1.005–1.030)
Squamous Epithelial / HPF: NONE SEEN (ref 0–5)
WBC, UA: >50 WBC/hpf — ABNORMAL HIGH (ref 0–5)
pH: 6 (ref 5.0–8.0)

## 2019-01-31 LAB — CREATININE, URINE, RANDOM: Creatinine, Urine: 112 mg/dL

## 2019-01-31 MED ORDER — HYDRALAZINE HCL 50 MG PO TABS
25.0000 mg | ORAL_TABLET | Freq: Three times a day (TID) | ORAL | Status: DC | PRN
  Filled 2019-01-31: qty 1

## 2019-01-31 MED ORDER — SODIUM CHLORIDE 0.9 % IV SOLN: INTRAVENOUS | Status: DC

## 2019-01-31 MED ORDER — ATORVASTATIN CALCIUM 20 MG PO TABS
40.0000 mg | ORAL_TABLET | Freq: Every day | ORAL | Status: DC
  Administered 2019-02-01 – 2019-02-05 (×5): 40 mg via ORAL
  Filled 2019-01-31 (×5): qty 2

## 2019-01-31 MED ORDER — ONDANSETRON HCL 4 MG/2ML IJ SOLN: 4.0000 mg | Freq: Three times a day (TID) | INTRAMUSCULAR | Status: DC | PRN

## 2019-01-31 MED ORDER — TRAZODONE HCL 50 MG PO TABS
50.0000 mg | ORAL_TABLET | Freq: Every day | ORAL | Status: DC
  Administered 2019-01-31 – 2019-02-04 (×5): 50 mg via ORAL
  Filled 2019-01-31 (×5): qty 1

## 2019-01-31 MED ORDER — CALCITRIOL 0.25 MCG PO CAPS
0.2500 ug | ORAL_CAPSULE | Freq: Every day | ORAL | Status: DC
  Administered 2019-01-31 – 2019-02-05 (×6): 0.25 ug via ORAL
  Filled 2019-01-31 (×6): qty 1

## 2019-01-31 MED ORDER — LORAZEPAM 2 MG/ML IJ SOLN: 1.0000 mg | INTRAMUSCULAR | Status: DC | PRN

## 2019-01-31 MED ORDER — CITALOPRAM HYDROBROMIDE 20 MG PO TABS
20.0000 mg | ORAL_TABLET | Freq: Every day | ORAL | Status: DC
  Administered 2019-01-31 – 2019-02-05 (×6): 20 mg via ORAL
  Filled 2019-01-31 (×6): qty 1

## 2019-01-31 MED ORDER — METOPROLOL TARTRATE 25 MG PO TABS
25.0000 mg | ORAL_TABLET | Freq: Two times a day (BID) | ORAL | Status: DC
  Administered 2019-01-31 – 2019-02-05 (×9): 25 mg via ORAL
  Filled 2019-01-31 (×10): qty 1

## 2019-01-31 MED ORDER — FOLIC ACID 1 MG PO TABS
1.0000 mg | ORAL_TABLET | Freq: Two times a day (BID) | ORAL | Status: DC
  Administered 2019-01-31 – 2019-02-05 (×10): 1 mg via ORAL
  Filled 2019-01-31 (×10): qty 1

## 2019-01-31 MED ORDER — ACETAMINOPHEN 325 MG PO TABS
650.0000 mg | ORAL_TABLET | Freq: Four times a day (QID) | ORAL | Status: DC | PRN
  Filled 2019-01-31: qty 2

## 2019-01-31 MED ORDER — SIROLIMUS 1 MG PO TABS
3.0000 mg | ORAL_TABLET | Freq: Every day | ORAL | Status: DC
  Administered 2019-02-01 – 2019-02-05 (×5): 3 mg via ORAL
  Filled 2019-01-31 (×5): qty 3

## 2019-01-31 MED ORDER — VITAMIN D3 25 MCG (1000 UNIT) PO TABS
1000.0000 [IU] | ORAL_TABLET | Freq: Every day | ORAL | Status: DC
  Administered 2019-02-01 – 2019-02-05 (×5): 1000 [IU] via ORAL
  Filled 2019-01-31 (×9): qty 1

## 2019-01-31 MED ORDER — HEPARIN SODIUM (PORCINE) 5000 UNIT/ML IJ SOLN
5000.0000 [IU] | Freq: Three times a day (TID) | INTRAMUSCULAR | Status: DC
  Administered 2019-01-31 – 2019-02-05 (×14): 5000 [IU] via SUBCUTANEOUS
  Filled 2019-01-31 (×15): qty 1

## 2019-01-31 MED ORDER — MYCOPHENOLATE SODIUM 180 MG PO TBEC
180.0000 mg | DELAYED_RELEASE_TABLET | Freq: Two times a day (BID) | ORAL | Status: DC
  Administered 2019-01-31 – 2019-02-05 (×10): 180 mg via ORAL
  Filled 2019-01-31 (×11): qty 1

## 2019-01-31 MED ORDER — ENSURE ENLIVE PO LIQD
237.0000 mL | Freq: Two times a day (BID) | ORAL | Status: DC
  Administered 2019-01-31 – 2019-02-05 (×8): 237 mL via ORAL

## 2019-01-31 MED ORDER — SODIUM CHLORIDE 0.9 % IV SOLN
1000.0000 mL | Freq: Once | INTRAVENOUS | Status: AC
  Administered 2019-01-31: 1000 mL via INTRAVENOUS

## 2019-01-31 MED ORDER — CALCIUM CARBONATE ANTACID 500 MG PO CHEW
1000.0000 mg | CHEWABLE_TABLET | Freq: Two times a day (BID) | ORAL | Status: DC
  Administered 2019-01-31 – 2019-02-05 (×10): 1000 mg via ORAL
  Filled 2019-01-31 (×11): qty 5

## 2019-01-31 MED ORDER — DONEPEZIL HCL 5 MG PO TABS
5.0000 mg | ORAL_TABLET | Freq: Every day | ORAL | Status: DC
  Administered 2019-01-31 – 2019-02-04 (×5): 5 mg via ORAL
  Filled 2019-01-31 (×5): qty 1

## 2019-01-31 NOTE — ED Notes (Signed)
Pt caregiver given brief for pt

## 2019-01-31 NOTE — H&P (Signed)
History and Physical    [name] GEX:[id number] DOB: [birth year] DOA: 01/31/2019  Referring MD/NP/PA:   PCP: Baxter Hire, MD   Patient coming from:  The patient is coming from home.  At baseline, pt is dependent for most of ADL.        Chief Complaint: shaking  HPI: [name] is a 68 y.o. female with medical history significant of kidney transplantation on immunosuppressants, hypertension, GERD, depression, uterine cancer, kidney cancer, dementia, anemia, C. difficile colitis, who presents with shaking.  Per care giver, pt has been having intermittent body shaking in the past several months, which has worsened today. No unilateral numbness or tingling his extremities.  No facial droop or slurred speech.  Patient denies any chest pain, shortness breath, cough.  No nausea, vomiting, diarrhea, abdominal pain.  Patient has some burning on urination, denies dysuria or increased urinary frequency.  She has foul-smelling urine per caregiver.  ED Course: pt was found to have positive urinalysis for UTI, pending COVID-19 test, hyperkalemia with potassium 5.2, worsening renal function, temperature normal, blood pressure 148/74, heart rate 58, oxygen saturation 100% on room air.  Patient is placed on MedSurg bed for observation.  Renal, Dr. Holley Raring was consulted.  Review of Systems:   General: no fevers, chills, no body weight gain, has fatigue HEENT: no blurry vision, hearing changes or sore throat Respiratory: no dyspnea, coughing, wheezing CV: no chest pain, no palpitations GI: no nausea, vomiting, abdominal pain, diarrhea, constipation GU: no dysuria, has burning on urination, no increased urinary frequency, hematuria  Ext: no leg edema Neuro: no unilateral weakness, numbness, or tingling, no vision change or hearing loss. Has shaking. Skin: no rash, no skin tear. MSK: No muscle spasm, no deformity, no limitation of range of movement in spin Heme: No easy bruising.  Travel  history: No recent long distant travel.  Allergy:  Allergies  Allergen Reactions  . Ace Inhibitors Cough  . Latex Swelling  . Lisinopril Cough    Past Medical History:  Diagnosis Date  . Anemia   . C. difficile colitis   . Cancer of kidney ([place])   . Dementia ([place])   . GERD (gastroesophageal reflux disease)   . Hypertension   . Renal disorder   . Uterine cancer [facility])     Past Surgical History:  Procedure Laterality Date  . ABDOMINAL HYSTERECTOMY    . AV FISTULA PLACEMENT    . BREAST BIOPSY     Patient is unsure but she thinks she remember a Bx  . BREAST BIOPSY Right 2017   benign  . KIDNEY TRANSPLANT    . NEPHRECTOMY TRANSPLANTED ORGAN      Social History:  reports that she has quit smoking. She has never used smokeless tobacco. She reports that she does not drink alcohol or use drugs.  Family History:  Family History  Problem Relation Age of Onset  . Stroke Mother   . Alcohol abuse Father   . Breast cancer Neg Hx      Prior to Admission medications   Medication Sig Start Date End Date Taking? Authorizing Provider  atorvastatin (LIPITOR) 40 MG tablet Take 40 mg by mouth daily.     [provider]  calcitRIOL (ROCALTROL) 0.25 MCG capsule Take 0.25 mcg by mouth daily.    [provider]  calcium elemental as carbonate (TUMS ULTRA 1000) 400 MG chewable tablet Chew 1,000 mg by mouth 2 (two) times daily.     [provider]  cholecalciferol (  VITAMIN D3) 25 MCG (1000 UT) tablet Take 1,000 Units by mouth daily.    [provider]  citalopram (CELEXA) 20 MG tablet Take 20 mg by mouth daily.    [provider]  donepezil (ARICEPT) 5 MG tablet Take 5 mg by mouth at bedtime.     [provider]  feeding supplement, ENSURE ENLIVE, (ENSURE ENLIVE) LIQD Take 237 mLs by mouth 2 (two) times daily between meals. 09/27/18   Saundra Shelling, MD  folic acid (FOLVITE) 1 MG tablet Take 1 mg by mouth 2 (two) times daily.     [provider]  furosemide (LASIX) 20 MG tablet Take 1 tablet (20 mg total) by mouth daily. 08/17/18   Gladstone Lighter, MD  gabapentin (NEURONTIN) 100 MG capsule Take 100-200 mg by mouth 2 (two) times daily. 11/28/18   [provider]  Melatonin 10 MG TABS Take 20 mg by mouth at bedtime.    [provider]  meloxicam (MOBIC) 15 MG tablet Take 15 mg by mouth daily. 06/14/18   [provider]  metoprolol tartrate (LOPRESSOR) 25 MG tablet Take 25 mg by mouth 2 (two) times daily.    [provider]  mycophenolate (MYFORTIC) 180 MG EC tablet Take 180 mg by mouth 2 (two) times daily.    [provider]  potassium chloride (K-DUR) 10 MEQ tablet Take 2 tablets (20 mEq total) by mouth daily. ONLY WHILE TAKING LASIX Patient taking differently: Take 20 mEq by mouth 2 (two) times daily.  08/17/18   Gladstone Lighter, MD  sirolimus (RAPAMUNE) 1 MG tablet Take 3 mg by mouth daily.     [provider]  traZODone (DESYREL) 50 MG tablet Take 50 mg by mouth at bedtime.  10/11/18 10/11/19  [provider]  vitamin B-12 (CYANOCOBALAMIN) 1000 MCG tablet Take 1 tablet (1,000 mcg total) by mouth daily. 12/02/18   Dustin Flock, MD    Physical Exam: Vitals:   01/31/19 1036  BP: 140/74  Pulse: (!) 58  Resp: 18  Temp: 98.5 F (36.9 C)  TempSrc: Oral  SpO2: 100%  Height: 4\' 11"  (1.499 m)   General: Not in acute distress HEENT:       Eyes: PERRL, EOMI, no scleral icterus.       ENT: No discharge from the ears and nose, no pharynx injection, no tonsillar enlargement.        Neck: No JVD, no bruit, no mass felt. Heme: No neck lymph node enlargement. Cardiac: S1/S2, RRR, No murmurs, No gallops or rubs. Respiratory: No rales, wheezing, rhonchi or rubs. GI: Soft, nondistended, nontender, no rebound pain, no organomegaly, BS present. GU: No hematuria Ext: No pitting leg edema bilaterally. 2+DP/PT pulse bilaterally. Musculoskeletal: No joint  deformities, No joint redness or warmth, no limitation of ROM in spin. Skin: No rashes.  Neuro: Alert, oriented X3, cranial nerves II-XII grossly intact, moves all extremities normally.  Psych: Patient is not psychotic, no suicidal or hemocidal ideation.  Labs on Admission: I have personally reviewed following labs and imaging studies  CBC: Recent Labs  Lab 01/31/19 1037  WBC 5.1  HGB 10.3*  HCT 34.3*  MCV 83.1  PLT 387   Basic Metabolic Panel: Recent Labs  Lab 01/31/19 1037  NA 142  K 5.2*  CL 117*  CO2 17*  GLUCOSE 97  BUN 66*  CREATININE 3.10*  CALCIUM 9.4   GFR: CrCl cannot be calculated (Unknown ideal weight.). Liver Function Tests: Recent Labs  Lab 01/31/19 1037  AST 13*  ALT 10  ALKPHOS 43  BILITOT 0.4  PROT 7.4  ALBUMIN 3.6   No results for input(s): LIPASE, AMYLASE in the last 168 hours. No results for input(s): AMMONIA in the last 168 hours. Coagulation Profile: No results for input(s): INR, PROTIME in the last 168 hours. Cardiac Enzymes: No results for input(s): CKTOTAL, CKMB, CKMBINDEX, TROPONINI in the last 168 hours. BNP (last 3 results) No results for input(s): PROBNP in the last 8760 hours. HbA1C: No results for input(s): HGBA1C in the last 72 hours. CBG: No results for input(s): GLUCAP in the last 168 hours. Lipid Profile: No results for input(s): CHOL, HDL, LDLCALC, TRIG, CHOLHDL, LDLDIRECT in the last 72 hours. Thyroid Function Tests: No results for input(s): TSH, T4TOTAL, FREET4, T3FREE, THYROIDAB in the last 72 hours. Anemia Panel: No results for input(s): VITAMINB12, FOLATE, FERRITIN, TIBC, IRON, RETICCTPCT in the last 72 hours. Urine analysis:    Component Value Date/Time   COLORURINE YELLOW (A) 01/31/2019 1037   APPEARANCEUR TURBID (A) 01/31/2019 1037   APPEARANCEUR Clear 02/25/2014 1407   LABSPEC 1.014 01/31/2019 1037   LABSPEC 1.009 02/25/2014 1407   PHURINE 6.0 01/31/2019 1037   GLUCOSEU NEGATIVE 01/31/2019 1037    GLUCOSEU Negative 02/25/2014 1407   HGBUR LARGE (A) 01/31/2019 1037   BILIRUBINUR NEGATIVE 01/31/2019 1037   BILIRUBINUR Negative 02/25/2014 1407   KETONESUR NEGATIVE 01/31/2019 1037   PROTEINUR 100 (A) 01/31/2019 1037   NITRITE NEGATIVE 01/31/2019 1037   LEUKOCYTESUR LARGE (A) 01/31/2019 1037   LEUKOCYTESUR Negative 02/25/2014 1407   Sepsis Labs: @LABRCNTIP (procalcitonin:4,lacticidven:4) )No results found for this or any previous visit (from the past 240 hour(s)).   Radiological Exams on Admission: US Renal Transplant w/Doppler  Result Date: 01/31/2019 CLINICAL DATA:  Acute kidney injury, renal transplant recipient in 2010 EXAM: ULTRASOUND OF RENAL TRANSPLANT WITH RENAL DOPPLER ULTRASOUND TECHNIQUE: Ultrasound examination of the renal transplant was performed with gray-scale, color and duplex doppler evaluation. COMPARISON:  CT renal colic 30/10/2328 FINDINGS: Transplant kidney location: RLQ Transplant Kidney: Renal measurements: 11.3 x 5.1 x 4.2 cm = volume: 125.45mL. Normal in size and parenchymal echogenicity. No evidence of mass. Mild calyceal dilatation resolved postvoiding, likely physiologic. No peri-transplant fluid collection seen. Color flow in the main renal artery:  Yes Color flow in the main renal vein:  Yes Duplex Doppler Evaluation: Main Renal Artery Resistive Index: 0.8 Venous waveform in main renal vein:  Present Intrarenal resistive index in upper pole:  0.7 (normal 0.6-0.8; equivocal 0.8-0.9; abnormal >= 0.9) Intrarenal resistive index in lower pole: 0.7 (normal 0.6-0.8; equivocal 0.8-0.9; abnormal >= 0.9) Bladder: Normal for degree of bladder distention. Other findings:  None. IMPRESSION: Resistive index in the main renal artery is borderline elevated at 0.81. This is a nonspecific finding in the absence of comparison Doppler evaluation. Such findings could be considered within the clinical context. Mild caliectasis in the transplant resolved with voiding. Likely physiologic. No  other abnormality is seen. Electronically Signed   By: Lovena Le M.D.   On: 01/31/2019 17:43     EKG:   Not done in ED, will get one.   Assessment/Plan Principal Problem:   Acute renal failure superimposed on stage 4 chronic kidney disease (HCC) Active Problems:   Essential hypertension   H/O kidney transplant   Dementia (HCC)   Shaking   Hyperkalemia   HLD (hyperlipidemia)   Acute renal failure superimposed on stage 4 chronic kidney disease ([place]):  Baseline Cre is 1.9 recently, pt's Cre is 3.10 and  BUN 66 on admission. Likely due to dehydration and continuation of diuretics, NSAIDs.  -place on med-surg bed for obs - IVF: 1L NS, then 75 cc/h - Follow up renal function by BMP - Avoid using renal toxic medications, hypotension and contrast dye (or carefully use) - Check FeUrea - Hold lasix and mobic - US-renal -consulted renal, Dr. Holley Raring  Positive UA: I consulted ID, Dr. Delaine Lame. Since pt does not have any fever or leukocytosis, as she has had cdiff in the past. Dr. Levester Fresh recommended not to start empiric antibiotics for a dirty urine. -observe closely for any signs of fever, leukocytosis or sepsis  HTN:  -Continue home medications: Metoprolol -Hold Lasix due to worsening renal function -hydralazine prn  H/O kidney transplant -continue home mycophenolate and sirolimus  Dementia ([place]): pt is calm -continue home donepezil  Shaking: Etiology is not clear.  Possibly due to gabapentin use, hold off gabapentin per renal,Dr. lateef's recommendation. -hold off gabapentin  Hyperkalemia: mild, K 5.2 -IVF -check EKG  HLD (hyperlipidemia): -lipitor    DVT ppx: SQ Heparin    Code Status: Full code Family Communication:  Yes, patient's care giver  at bed side. I also called her daughter by phone. Disposition Plan:  Anticipate discharge back to previous home environment Consults called:  Renal, Dr. Holley Raring Admission status:  medical floor/obs     Date of  Service 01/31/2019    [place] [facility]   If 7PM-7AM, please contact night-coverage [email or web address] Password [facility] 01/31/2019, 6:27 PM

## 2019-01-31 NOTE — ED Triage Notes (Signed)
Per caregiver, "patients body keeps jumping". Caregiver reports has been doing that for several months and has foul smelling urine.

## 2019-01-31 NOTE — ED Notes (Signed)
Admitting dr at bedside.  

## 2019-01-31 NOTE — ED Provider Notes (Signed)
[facility] Emergency Department Provider Note   ____________________________________________    I have reviewed the triage vital signs and the nursing notes.   HISTORY  Chief Complaint Shaking     HPI [name] is a 68 y.o. female with a history of dementia who presents accompanied by her caregiver for reports of "shaking."  Reportedly this is been happening for over 3 months however seems to be worse today. Caregiver also notes foul-smelling urine.  Review of medical records demonstrates the patient has a history of a kidney transplant.  Patient has no complaints at this time, denies pain, no fevers.  No cough, no shortness of breath  Past Medical History:  Diagnosis Date  . Anemia   . C. difficile colitis   . Cancer of kidney ([place])   . Dementia ([place])   . GERD (gastroesophageal reflux disease)   . Hypertension   . Renal disorder   . Uterine cancer [facility])     Patient Active Problem List   Diagnosis Date Noted  . Acute lower UTI 11/07/2018  . C. difficile colitis 11/07/2018  . Pressure injury of skin 09/23/2018  . Acute encephalopathy 09/22/2018  . Renal failure (ARF), acute on chronic ([place]) 08/12/2018  . Confusion 03/23/2018  . Hypokalemia 06/01/2017  . Sepsis ([place]) 06/29/2016  . UTI (urinary tract infection) 06/29/2016  . GERD (gastroesophageal reflux disease) 06/29/2016  . Dementia ([place]) 06/29/2016  . 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 Essential hypertension 07/06/2014  . H/O kidney transplant 07/06/2014    Past Surgical History:  Procedure Laterality Date  . ABDOMINAL HYSTERECTOMY    . AV FISTULA PLACEMENT    . BREAST BIOPSY     Patient is unsure but she thinks she remember a Bx  . BREAST BIOPSY Right 2017   benign  . KIDNEY TRANSPLANT    . NEPHRECTOMY TRANSPLANTED ORGAN      Prior to Admission medications   Medication Sig Start Date End Date Taking? Authorizing Provider  atorvastatin (LIPITOR) 40 MG tablet  Take 40 mg by mouth daily.     [provider]  calcitRIOL (ROCALTROL) 0.25 MCG capsule Take 0.25 mcg by mouth daily.    [provider]  calcium elemental as carbonate (TUMS ULTRA 1000) 400 MG chewable tablet Chew 1,000 mg by mouth 2 (two) times daily.     [provider]  cholecalciferol (VITAMIN D3) 25 MCG (1000 UT) tablet Take 1,000 Units by mouth daily.    [provider]  citalopram (CELEXA) 20 MG tablet Take 20 mg by mouth daily.    [provider]  donepezil (ARICEPT) 5 MG tablet Take 5 mg by mouth at bedtime.     [provider]  feeding supplement, ENSURE ENLIVE, (ENSURE ENLIVE) LIQD Take 237 mLs by mouth 2 (two) times daily between meals. 09/27/18   Saundra Shelling, MD  folic acid (FOLVITE) 1 MG tablet Take 1 mg by mouth 2 (two) times daily.     [provider]  furosemide (LASIX) 20 MG tablet Take 1 tablet (20 mg total) by mouth daily. 08/17/18   Gladstone Lighter, MD  gabapentin (NEURONTIN) 100 MG capsule Take 100-200 mg by mouth 2 (two) times daily. 11/28/18   [provider]  Melatonin 10 MG TABS Take 20 mg by mouth at bedtime.    [provider]  meloxicam (MOBIC) 15 MG tablet Take 15 mg by mouth daily. 06/14/18   [provider]  metoprolol tartrate (LOPRESSOR) 25 MG  tablet Take 25 mg by mouth 2 (two) times daily.    [provider]  mycophenolate (MYFORTIC) 180 MG EC tablet Take 180 mg by mouth 2 (two) times daily.    [provider]  potassium chloride (K-DUR) 10 MEQ tablet Take 2 tablets (20 mEq total) by mouth daily. ONLY WHILE TAKING LASIX Patient taking differently: Take 20 mEq by mouth 2 (two) times daily.  08/17/18   Gladstone Lighter, MD  sirolimus (RAPAMUNE) 1 MG tablet Take 3 mg by mouth daily.     [provider]  traZODone (DESYREL) 50 MG tablet Take 50 mg by mouth at bedtime.  10/11/18 10/11/19  [provider]  vitamin B-12 (CYANOCOBALAMIN) 1000 MCG  tablet Take 1 tablet (1,000 mcg total) by mouth daily. 12/02/18   Dustin Flock, MD     Allergies Ace inhibitors, Latex, and Lisinopril  Family History  Problem Relation Age of Onset  . Stroke Mother   . Alcohol abuse Father   . Breast cancer Neg Hx     Social History Social History   Tobacco Use  . Smoking status: Former Research scientist (life sciences)  . Smokeless tobacco: Never Used  Substance Use Topics  . Alcohol use: No  . Drug use: No    Review of Systems  Constitutional: No fevers Eyes: No visual changes.  ENT: No sore throat. Cardiovascular: Denies chest pain. Respiratory: No cough Gastrointestinal: No abdominal pain.  Genitourinary: Foul-smelling urine Musculoskeletal: Negative for back pain. Skin: Negative for rash. Neurological: Negative for headaches    ____________________________________________   PHYSICAL EXAM:  VITAL SIGNS: ED Triage Vitals  Enc Vitals Group     BP 01/31/19 1036 140/74     Pulse Rate 01/31/19 1036 (!) 58     Resp 01/31/19 1036 18     Temp 01/31/19 1036 98.5 F (36.9 C)     Temp Source 01/31/19 1036 Oral     SpO2 01/31/19 1036 100 %     Weight --      Height 01/31/19 1036 1.499 m (4\' 11" )     Head Circumference --      Peak Flow --      Pain Score 01/31/19 1045 0     Pain Loc --      Pain Edu? --      Excl. in [place]? --     Constitutional: Alert, pleasant Eyes: Conjunctivae are normal.  Head: Atraumatic. Nose: No congestion/rhinnorhea. Mouth/Throat: Mucous membranes are moist.    Cardiovascular: Normal rate, regular rhythm.   Good peripheral circulation. Respiratory: Normal respiratory effort.  No retractions.  Gastrointestinal: Soft and nontender. No distention.    Musculoskeletal: Left arm AV fistula, positive thrill, extremities are warm and well perfused Neurologic:  Normal speech and language. No gross focal neurologic deficits are appreciated.  Skin:  Skin is warm, dry and intact. No rash noted. Psychiatric: Mood and affect are  normal. Speech and behavior are normal.  ____________________________________________   LABS (all labs ordered are listed, but only abnormal results are displayed)  Labs Reviewed  CBC - Abnormal; Notable for the following components:      Result Value   Hemoglobin 10.3 (*)    HCT 34.3 (*)    MCH 24.9 (*)    All other components within normal limits  COMPREHENSIVE METABOLIC PANEL - Abnormal; Notable for the following components:   Potassium 5.2 (*)    Chloride 117 (*)    CO2 17 (*)    BUN 66 (*)  Creatinine, Ser 3.10 (*)    AST 13 (*)    GFR calc non Af Amer 15 (*)    GFR calc Af Amer 17 (*)    All other components within normal limits  SARS CORONAVIRUS 2 (TAT 6-24 HRS)  URINALYSIS, COMPLETE (UACMP) WITH MICROSCOPIC   ____________________________________________  EKG  None ____________________________________________  RADIOLOGY  None ____________________________________________   PROCEDURES  Procedure(s) performed: No  Procedures   Critical Care performed: No ____________________________________________   INITIAL IMPRESSION / ASSESSMENT AND PLAN / ED COURSE  Pertinent labs & imaging results that were available during my care of the patient were reviewed by me and considered in my medical decision making (see chart for details).  Patient with a history of kidney transplant presents with shaking/tremor.  No significant tremor visible at this time however lab work notable for BUN and creatinine significantly elevated from baseline consistent with acute on chronic kidney injury.  Given history of kidney transplant will require IV fluids, admission.  No Covid symptoms    ____________________________________________   FINAL CLINICAL IMPRESSION(S) / ED DIAGNOSES  Final diagnoses:  Acute renal failure superimposed on chronic kidney disease, unspecified CKD stage, unspecified acute renal failure type [facility])  Kidney transplanted        Note:  This  document was prepared using Dragon voice recognition software and may include unintentional dictation errors.   Lavonia Drafts, MD 01/31/19 1246

## 2019-01-31 NOTE — ED Notes (Signed)
Went to room to get urine sample- was informed that pt was not able to void- informed caregiver that pt would need to be admitted for her kidney function- caregiver states she wants to take pt home if the doctor was not going to do a "swab"

## 2019-01-31 NOTE — ED Notes (Signed)
Seizure pads In place. Lab called for blood draw for blood cultures as pt is a difficult stick. Lad called to add urine tests to urine already collected. Lab states they have enough urine.

## 2019-01-31 NOTE — ED Notes (Signed)
Dr Kinner at bedside. 

## 2019-01-31 NOTE — ED Notes (Signed)
Per caregiver they are here to get pt's "vagina swabbed for bacteria" and requesting it be done as soon as possible as pt is hungry and they are ready to go home

## 2019-01-31 NOTE — ED Triage Notes (Signed)
Pt in via EMS from home with c/o weakness since this am and a twitch which is common when she has a UTI. Pt's daughter and homehealth caregiver reports they changed the incontinent pad and it smelled foul. Concern for UTI 98.6, FSBS 124

## 2019-01-31 NOTE — ED Notes (Signed)
Hat placed in toilet for urine sample

## 2019-02-01 DIAGNOSIS — L8992 Pressure ulcer of unspecified site, stage 2: Secondary | ICD-10-CM | POA: Diagnosis present

## 2019-02-01 DIAGNOSIS — Z87891 Personal history of nicotine dependence: Secondary | ICD-10-CM | POA: Diagnosis not present

## 2019-02-01 DIAGNOSIS — I10 Essential (primary) hypertension: Secondary | ICD-10-CM | POA: Diagnosis not present

## 2019-02-01 DIAGNOSIS — K219 Gastro-esophageal reflux disease without esophagitis: Secondary | ICD-10-CM | POA: Diagnosis present

## 2019-02-01 DIAGNOSIS — E7849 Other hyperlipidemia: Secondary | ICD-10-CM | POA: Diagnosis not present

## 2019-02-01 DIAGNOSIS — M898X9 Other specified disorders of bone, unspecified site: Secondary | ICD-10-CM | POA: Diagnosis present

## 2019-02-01 DIAGNOSIS — F0390 Unspecified dementia without behavioral disturbance: Secondary | ICD-10-CM | POA: Diagnosis present

## 2019-02-01 DIAGNOSIS — N179 Acute kidney failure, unspecified: Secondary | ICD-10-CM | POA: Diagnosis present

## 2019-02-01 DIAGNOSIS — E785 Hyperlipidemia, unspecified: Secondary | ICD-10-CM | POA: Diagnosis present

## 2019-02-01 DIAGNOSIS — E875 Hyperkalemia: Secondary | ICD-10-CM | POA: Diagnosis present

## 2019-02-01 DIAGNOSIS — Z79890 Hormone replacement therapy (postmenopausal): Secondary | ICD-10-CM | POA: Diagnosis not present

## 2019-02-01 DIAGNOSIS — E860 Dehydration: Secondary | ICD-10-CM | POA: Diagnosis present

## 2019-02-01 DIAGNOSIS — R251 Tremor, unspecified: Secondary | ICD-10-CM | POA: Diagnosis present

## 2019-02-01 DIAGNOSIS — I120 Hypertensive chronic kidney disease with stage 5 chronic kidney disease or end stage renal disease: Secondary | ICD-10-CM | POA: Diagnosis present

## 2019-02-01 DIAGNOSIS — Z20828 Contact with and (suspected) exposure to other viral communicable diseases: Secondary | ICD-10-CM | POA: Diagnosis present

## 2019-02-01 DIAGNOSIS — N184 Chronic kidney disease, stage 4 (severe): Secondary | ICD-10-CM | POA: Diagnosis not present

## 2019-02-01 DIAGNOSIS — E669 Obesity, unspecified: Secondary | ICD-10-CM | POA: Diagnosis present

## 2019-02-01 DIAGNOSIS — Z8542 Personal history of malignant neoplasm of other parts of uterus: Secondary | ICD-10-CM | POA: Diagnosis not present

## 2019-02-01 DIAGNOSIS — Z85528 Personal history of other malignant neoplasm of kidney: Secondary | ICD-10-CM | POA: Diagnosis not present

## 2019-02-01 DIAGNOSIS — N2581 Secondary hyperparathyroidism of renal origin: Secondary | ICD-10-CM | POA: Diagnosis present

## 2019-02-01 DIAGNOSIS — N186 End stage renal disease: Secondary | ICD-10-CM | POA: Diagnosis present

## 2019-02-01 DIAGNOSIS — T8619 Other complication of kidney transplant: Secondary | ICD-10-CM | POA: Diagnosis present

## 2019-02-01 DIAGNOSIS — Z6839 Body mass index (BMI) 39.0-39.9, adult: Secondary | ICD-10-CM | POA: Diagnosis not present

## 2019-02-01 DIAGNOSIS — Z791 Long term (current) use of non-steroidal anti-inflammatories (NSAID): Secondary | ICD-10-CM | POA: Diagnosis not present

## 2019-02-01 DIAGNOSIS — D631 Anemia in chronic kidney disease: Secondary | ICD-10-CM | POA: Diagnosis present

## 2019-02-01 DIAGNOSIS — E872 Acidosis: Secondary | ICD-10-CM | POA: Diagnosis present

## 2019-02-01 DIAGNOSIS — Z79899 Other long term (current) drug therapy: Secondary | ICD-10-CM | POA: Diagnosis not present

## 2019-02-01 LAB — BASIC METABOLIC PANEL
Anion gap: 7 (ref 5–15)
BUN: 61 mg/dL — ABNORMAL HIGH (ref 8–23)
CO2: 17 mmol/L — ABNORMAL LOW (ref 22–32)
Calcium: 8.7 mg/dL — ABNORMAL LOW (ref 8.9–10.3)
Chloride: 116 mmol/L — ABNORMAL HIGH (ref 98–111)
Creatinine, Ser: 2.49 mg/dL — ABNORMAL HIGH (ref 0.44–1.00)
GFR calc Af Amer: 22 mL/min — ABNORMAL LOW (ref >60)
GFR calc non Af Amer: 19 mL/min — ABNORMAL LOW (ref >60)
Glucose, Bld: 97 mg/dL (ref 70–99)
Potassium: 4.4 mmol/L (ref 3.5–5.1)
Sodium: 140 mmol/L (ref 135–145)

## 2019-02-01 LAB — CBC
HCT: 28.6 % — ABNORMAL LOW (ref 36.0–46.0)
Hemoglobin: 9 g/dL — ABNORMAL LOW (ref 12.0–15.0)
MCH: 24.3 pg — ABNORMAL LOW (ref 26.0–34.0)
MCHC: 31.5 g/dL (ref 30.0–36.0)
MCV: 77.3 fL — ABNORMAL LOW (ref 80.0–100.0)
Platelets: 155 10*3/uL (ref 150–400)
RBC: 3.7 MIL/uL — ABNORMAL LOW (ref 3.87–5.11)
RDW: 15.1 % (ref 11.5–15.5)
WBC: 3.8 10*3/uL — ABNORMAL LOW (ref 4.0–10.5)
nRBC: 0 % (ref 0.0–0.2)

## 2019-02-01 LAB — SARS CORONAVIRUS 2 (TAT 6-24 HRS): SARS Coronavirus 2: NEGATIVE

## 2019-02-01 LAB — URINE CULTURE

## 2019-02-01 LAB — UREA NITROGEN, URINE: Urea Nitrogen, Ur: 630 mg/dL

## 2019-02-01 MED ORDER — SODIUM BICARBONATE 650 MG PO TABS
650.0000 mg | ORAL_TABLET | Freq: Every day | ORAL | Status: DC
  Administered 2019-02-01: 650 mg via ORAL
  Filled 2019-02-01 (×2): qty 1

## 2019-02-01 NOTE — Progress Notes (Signed)
PROGRESS NOTE    [name]  WUX:[id number] DOB: May 15, 1950 DOA: 01/31/2019 PCP: Baxter Hire, MD       Assessment & Plan:   Principal Problem:   Acute renal failure superimposed on stage 4 chronic kidney disease ([place]) Active Problems:   Essential hypertension   H/O kidney transplant   Dementia ([place])   Shaking   Hyperkalemia   HLD (hyperlipidemia)   AKI on CKD IV: Baseline Cre is 1.9 recently. Cr is trending down today. Likely due to dehydration, diuretics, NSAIDs. Nephro consulted. Will continue to monitor   Metabolic acidosis: will start bicarb  Positive UA: ID rec not starting abxs for positive UA and observing closely for signs of sepsis  HTN: continue on metoprolol. Hydralazine prn. Hold lasix secondary to AKI on CKD  Leukopenia: etiology unclear, possible secondary to immunosuppressant meds. 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 No need for a transfusion at this time. Will continue to monitor   Dementia: continue home donepezil  Shaking: etiology unclear. Possibly due to gabapentin use, hold off gabapentin per renal,Dr. lateef's recommendation.  Hyperkalemia: resolved. Will continue to monitor   HLD: continue statin    DVT prophylaxis: heparin Code Status: full Family Communication:  Disposition Plan:    Consultants:   Nephro, ID   Procedures: n/a   Antimicrobials: none   Subjective: Pt c/o fatigue  Objective: Vitals:   01/31/19 2330 02/01/19 0505 02/01/19 0606 02/01/19 0908  BP: 139/77 138/73 (!) 157/94 (!) 113/53  Pulse: 76 62 (!) 53 62  Resp: 16  17 16   Temp: 97.6 F (36.4 C)  97.6 F (36.4 C) 97.6 F (36.4 C)  TempSrc: Oral  Oral Oral  SpO2: 100% 95% 100% 100%  Weight:   89.4 kg   Height:   4\' 11"  (1.499 m)     Intake/Output Summary (Last 24 hours) at 02/01/2019 1328 Last data filed at 02/01/2019 1000 Gross per 24  hour  Intake 1047.99 ml  Output 250 ml  Net 797.99 ml   Filed Weights   02/01/19 0606  Weight: 89.4 kg    Examination:  General exam: Appears calm and comfortable. Poor dentition Respiratory system: Clear to auscultation. Respiratory effort normal. Cardiovascular system: S1 & S2 +ve. No rubs, gallops or clicks.  Gastrointestinal system: Abdomen is nondistended, soft and nontender.  Normal bowel sounds heard. Central nervous system: Alert and awake. Moves all 4 extremities Psychiatry: Judgement and insight abnormal, hx of dementia. Flat mood and affect    Data Reviewed: I have personally reviewed following labs and imaging studies  CBC: Recent Labs  Lab 01/31/19 1037 02/01/19 0832  WBC 5.1 3.8*  HGB 10.3* 9.0*  HCT 34.3* 28.6*  MCV 83.1 77.3*  PLT 167 253   Basic Metabolic Panel: Recent Labs  Lab 01/31/19 1037 02/01/19 0832  NA 142 140  K 5.2* 4.4  CL 117* 116*  CO2 17* 17*  GLUCOSE 97 97  BUN 66* 61*  CREATININE 3.10* 2.49*  CALCIUM 9.4 8.7*   GFR: Estimated Creatinine Clearance: 21.1 mL/min (A) (by C-G formula based on SCr of 2.49 mg/dL (H)). Liver Function Tests: Recent Labs  Lab 01/31/19 1037  AST 13*  ALT 10  ALKPHOS 43  BILITOT 0.4  PROT 7.4  ALBUMIN 3.6   No results for input(s): LIPASE, AMYLASE in the last 168 hours. No results for input(s): AMMONIA in the last 168 hours. Coagulation Profile: No results  for input(s): INR, PROTIME in the last 168 hours. Cardiac Enzymes: No results for input(s): CKTOTAL, CKMB, CKMBINDEX, TROPONINI in the last 168 hours. BNP (last 3 results) No results for input(s): PROBNP in the last 8760 hours. HbA1C: No results for input(s): HGBA1C in the last 72 hours. CBG: No results for input(s): GLUCAP in the last 168 hours. Lipid Profile: No results for input(s): CHOL, HDL, LDLCALC, TRIG, CHOLHDL, LDLDIRECT in the last 72 hours. Thyroid Function Tests: No results for input(s): TSH, T4TOTAL, FREET4, T3FREE,  THYROIDAB in the last 72 hours. Anemia Panel: No results for input(s): VITAMINB12, FOLATE, FERRITIN, TIBC, IRON, RETICCTPCT in the last 72 hours. Sepsis Labs: No results for input(s): PROCALCITON, LATICACIDVEN in the last 168 hours.  Recent Results (from the past 240 hour(s))  Today - UCx     Status: Abnormal   Collection Time: 01/31/19 10:37 AM   Specimen: Urine, Random  Result Value Ref Range Status   Specimen Description   Final    URINE, RANDOM Performed at [facility], [address], [place], [place] [address]    Special Requests   Final    NONE Performed at [name], [address] [place], [place] [address]    Culture MULTIPLE SPECIES PRESENT, SUGGEST RECOLLECTION (A)  Final   Report Status 02/01/2019 FINAL  Final  CULTURE, BLOOD (ROUTINE X 2) w Reflex to ID Panel     Status: None (Preliminary result)   Collection Time: 01/31/19  3:35 PM   Specimen: BLOOD  Result Value Ref Range Status   Specimen Description BLOOD RIGHT ANTECUBITAL  Final   Special Requests   Final    BOTTLES DRAWN AEROBIC AND ANAEROBIC Blood Culture adequate volume   Culture   Final    NO GROWTH < 24 HOURS Performed at [facility], [address], [place], [place] [address]    Report Status PENDING  Incomplete  SARS CORONAVIRUS 2 (TAT 6-24 HRS) Nasopharyngeal Nasopharyngeal Swab     Status: None   Collection Time: 01/31/19  4:21 PM   Specimen: Nasopharyngeal Swab  Result Value Ref Range Status   SARS Coronavirus 2 NEGATIVE NEGATIVE Final    Comment: (NOTE) SARS-CoV-2 target nucleic acids are NOT DETECTED. The SARS-CoV-2 RNA is generally detectable in upper and lower respiratory specimens during the acute phase of infection. Negative results do not preclude SARS-CoV-2 infection, do not rule out co-infections with other pathogens, and should not be used as the sole basis for treatment or other patient management decisions. Negative results must be combined with  clinical observations, patient history, and epidemiological information. The expected result is Negative. Fact Sheet for Patients: SugarRoll.be Fact Sheet for Healthcare Providers: https://www.woods-mathews.com/ This test is not yet approved or cleared by the [place] FDA and  has been authorized for detection and/or diagnosis of SARS-CoV-2 by FDA under an Emergency Use Authorization (EUA). This EUA will remain  in effect (meaning this test can be used) for the duration of the COVID-19 declaration under Section 56 4(b)(1) of the Act, 21 U.S.C. section 360bbb-3(b)(1), unless the authorization is terminated or revoked sooner. Performed at [facility], [place] [address], [place], [place] [address]   CULTURE, BLOOD (ROUTINE X 2) w Reflex to ID Panel     Status: None (Preliminary result)   Collection Time: 01/31/19  4:22 PM   Specimen: BLOOD  Result Value Ref Range Status   Specimen Description BLOOD BLOOD LEFT HAND  Final   Special Requests  Final    BOTTLES DRAWN AEROBIC AND ANAEROBIC Blood Culture adequate volume   Culture   Final    NO GROWTH < 24 HOURS Performed at [facility], [address] [place], [place] [address]    Report Status PENDING  Incomplete         Radiology Studies: US Renal Transplant w/Doppler  Result Date: 01/31/2019 CLINICAL DATA:  Acute kidney injury, renal transplant recipient in 2010 EXAM: ULTRASOUND OF RENAL TRANSPLANT WITH RENAL DOPPLER ULTRASOUND TECHNIQUE: Ultrasound examination of the renal transplant was performed with gray-scale, color and duplex doppler evaluation. COMPARISON:  CT renal colic 00/71/2197 FINDINGS: Transplant kidney location: RLQ Transplant Kidney: Renal measurements: 11.3 x 5.1 x 4.2 cm = volume: 125.13mL. Normal in size and parenchymal echogenicity. No evidence of mass. Mild calyceal dilatation resolved postvoiding, likely physiologic. No peri-transplant fluid collection  seen. Color flow in the main renal artery:  Yes Color flow in the main renal vein:  Yes Duplex Doppler Evaluation: Main Renal Artery Resistive Index: 0.8 Venous waveform in main renal vein:  Present Intrarenal resistive index in upper pole:  0.7 (normal 0.6-0.8; equivocal 0.8-0.9; abnormal >= 0.9) Intrarenal resistive index in lower pole: 0.7 (normal 0.6-0.8; equivocal 0.8-0.9; abnormal >= 0.9) Bladder: Normal for degree of bladder distention. Other findings:  None. IMPRESSION: Resistive index in the main renal artery is borderline elevated at 0.81. This is a nonspecific finding in the absence of comparison Doppler evaluation. Such findings could be considered within the clinical context. Mild caliectasis in the transplant resolved with voiding. Likely physiologic. No other abnormality is seen. Electronically Signed   By: Lovena Le M.D.   On: 01/31/2019 17:43        Scheduled Meds: . atorvastatin  40 mg Oral Daily  . calcitRIOL  0.25 mcg Oral Daily  . calcium carbonate  1,000 mg Oral BID  . cholecalciferol  1,000 Units Oral Daily  . citalopram  20 mg Oral Daily  . donepezil  5 mg Oral QHS  . feeding supplement (ENSURE ENLIVE)  237 mL Oral BID BM  . folic acid  1 mg Oral BID  . heparin  5,000 Units Subcutaneous Q8H  . metoprolol tartrate  25 mg Oral BID  . mycophenolate  180 mg Oral BID  . sirolimus  3 mg Oral Daily  . traZODone  50 mg Oral QHS   Continuous Infusions: . sodium chloride 75 mL/hr at 02/01/19 1000     LOS: 0 days    Time spent: 33 mins    Wyvonnia Dusky, MD [facility] Pager [phone]  If 7PM-7AM, please contact night-coverage [email or web address] Password [email or web address] 02/01/2019, 1:28 PM

## 2019-02-02 LAB — CBC
HCT: 31.2 % — ABNORMAL LOW (ref 36.0–46.0)
Hemoglobin: 9.8 g/dL — ABNORMAL LOW (ref 12.0–15.0)
MCH: 24.8 pg — ABNORMAL LOW (ref 26.0–34.0)
MCHC: 31.4 g/dL (ref 30.0–36.0)
MCV: 79 fL — ABNORMAL LOW (ref 80.0–100.0)
Platelets: 164 10*3/uL (ref 150–400)
RBC: 3.95 MIL/uL (ref 3.87–5.11)
RDW: 15 % (ref 11.5–15.5)
WBC: 5.9 10*3/uL (ref 4.0–10.5)
nRBC: 0 % (ref 0.0–0.2)

## 2019-02-02 LAB — BASIC METABOLIC PANEL
Anion gap: 8 (ref 5–15)
BUN: 56 mg/dL — ABNORMAL HIGH (ref 8–23)
CO2: 18 mmol/L — ABNORMAL LOW (ref 22–32)
Calcium: 9.2 mg/dL (ref 8.9–10.3)
Chloride: 115 mmol/L — ABNORMAL HIGH (ref 98–111)
Creatinine, Ser: 2.13 mg/dL — ABNORMAL HIGH (ref 0.44–1.00)
GFR calc Af Amer: 27 mL/min — ABNORMAL LOW (ref >60)
GFR calc non Af Amer: 23 mL/min — ABNORMAL LOW (ref >60)
Glucose, Bld: 125 mg/dL — ABNORMAL HIGH (ref 70–99)
Potassium: 4.4 mmol/L (ref 3.5–5.1)
Sodium: 141 mmol/L (ref 135–145)

## 2019-02-02 MED ORDER — SODIUM BICARBONATE 650 MG PO TABS
650.0000 mg | ORAL_TABLET | Freq: Two times a day (BID) | ORAL | Status: DC
  Administered 2019-02-02 – 2019-02-05 (×7): 650 mg via ORAL
  Filled 2019-02-02 (×8): qty 1

## 2019-02-02 NOTE — Evaluation (Signed)
Physical Therapy Evaluation Patient Details Name: [name] MRN: [id number] DOB: [birth year] Today's Date: 02/02/2019   History of Present Illness  Pt is a 68 year old female admitted with AKI, ESRD, Htn and hyperkalemia; also + for VRE.  PMH includes dementia and CA of kidney and uterus..  Clinical Impression  Pt is a 68 year old female who is documented to live with her daughter.  Pt intermittently appropriate during conversation and oriented to self only.  She was pleasant and willing to attempt to transfer to chair but demonstrated difficulty with follow through after initiating movement and placed her hands on her knees several times, appearing to be in pain.  When asked what was wrong, pt stated that she just felt "tired" and like her body "won't do what I want it to".  Pt max A for bed mobility and would be Max/total A +2 for a transfer at this time due to fatigue and delayed cognition.  Pt able to attempt LE there ex but did not complete any exercise she initiated.  Pt will continue to benefit from skilled PT with focus on strength, safe functional mobility and pain management.    Follow Up Recommendations SNF    Equipment Recommendations  Other (comment)    Recommendations for Other Services       Precautions / Restrictions Precautions Precautions: Fall Restrictions Weight Bearing Restrictions: No      Mobility  Bed Mobility Overal bed mobility: Needs Assistance Bed Mobility: Supine to Sit;Sit to Supine     Supine to sit: Max assist Sit to supine: Max assist   General bed mobility comments: Assistance to bring LE's over EOB and to bring trunk upright.  Pt initiated movement of UE/LE and would stop after initial movement.  When asked what she felt the cause was she stated, "I just feel tired" and " it won't do what I want it to".  Transfers Overall transfer level: (Unsafe at this time due to cognitive status and pt weakened state.)                   Ambulation/Gait                Stairs            Wheelchair Mobility    Modified Rankin (Stroke Patients Only)       Balance Overall balance assessment: Needs assistance Sitting-balance support: Feet unsupported;Bilateral upper extremity supported Sitting balance-Leahy Scale: Poor Sitting balance - Comments: Required PT to place hand on back to avoid posterior lean 90% of time. Postural control: Posterior lean                                   Pertinent Vitals/Pain Pain Assessment: Faces Faces Pain Scale: Hurts a little bit Pain Location: Gestured to knees when PT helped pt to EOB; also stated that her finger hurt and that she thought the PT had "stepped on it". Pain Descriptors / Indicators: Grimacing;Guarding Pain Intervention(s): Monitored during session;Limited activity within patient's tolerance    Home Living Family/patient expects to be discharged to:: Private residence Living Arrangements: Children(daughter) Available Help at Discharge: Family Type of Home: House Home Access: Other (comment)       Home Equipment: Walker - 2 wheels;Cane - single point Additional Comments: Pt unable to provide hx, though she did state that she lives with her daughter.  Pt stated that she walks around  the home without an AD; previous documentation suggests that she uses a [facility] for home ambulation.    Prior Function                 Hand Dominance   Dominant Hand: Right    Extremity/Trunk Assessment   Upper Extremity Assessment Upper Extremity Assessment: Generalized weakness;Difficult to assess due to impaired cognition    Lower Extremity Assessment Lower Extremity Assessment: Generalized weakness;Difficult to assess due to impaired cognition    Cervical / Trunk Assessment Cervical / Trunk Assessment: Kyphotic  Communication   Communication: No difficulties  Cognition Arousal/Alertness: Lethargic Behavior During Therapy: WFL for tasks  assessed/performed Overall Cognitive Status: History of cognitive impairments - at baseline Area of Impairment: Orientation;Following commands                 Orientation Level: Place;Time;Situation     Following Commands: Follows one step commands inconsistently       General Comments: Pt answered questions appropriately 30-40% of time but would then state "Oh, he sounds mad" when there was nobody else in the room speaking.  Very delayed response time to verbal and manual cues.      General Comments      Exercises Other Exercises Other Exercises: Ther. ex.: Seated balance/posture correction, attempted seated LE ther ex: ankle pumps, LAQ's, hip abduction with pt able to initiate movement and the "trailing off" each time. x6 min Other Exercises: Assistance with tray setup x 2 min   Assessment/Plan    PT Assessment Patient needs continued PT services  PT Problem List Decreased strength;Decreased mobility;Decreased activity tolerance;Decreased balance;Decreased knowledge of use of DME       PT Treatment Interventions DME instruction;Therapeutic activities;Gait training;Therapeutic exercise;Patient/family education;Balance training;Functional mobility training;Stair training    PT Goals (Current goals can be found in the Care Plan section)  Acute Rehab PT Goals PT Goal Formulation: Patient unable to participate in goal setting    Frequency Min 2X/week   Barriers to discharge        Co-evaluation               AM-PAC PT "6 Clicks" Mobility  Outcome Measure Help needed turning from your back to your side while in a flat bed without using bedrails?: A Lot Help needed moving from lying on your back to sitting on the side of a flat bed without using bedrails?: A Lot Help needed moving to and from a bed to a chair (including a wheelchair)?: Total Help needed standing up from a chair using your arms (e.g., wheelchair or bedside chair)?: Total Help needed to walk in  hospital room?: Total Help needed climbing 3-5 steps with a railing? : Total 6 Click Score: 8    End of Session   Activity Tolerance: Patient limited by fatigue Patient left: in bed;with call bell/phone within reach;with bed alarm set Nurse Communication: Mobility status PT Visit Diagnosis: Unsteadiness on feet (R26.81);Muscle weakness (generalized) (M62.81);Difficulty in walking, not elsewhere classified (R26.2)    Time: 0981-1914 PT Time Calculation (min) (ACUTE ONLY): 18 min   Charges:   PT Evaluation $PT Eval Low Complexity: 1 Low PT Treatments $Therapeutic Exercise: 8-22 mins        Roxanne Gates, PT, DPT   Roxanne Gates 02/02/2019, 10:16 AM

## 2019-02-02 NOTE — Progress Notes (Signed)
PROGRESS NOTE    [name]  ZSW:[id number] DOB: Aug 13, 1950 DOA: 01/31/2019 PCP: Baxter Hire, MD       Assessment & Plan:   Principal Problem:   Acute renal failure superimposed on stage 4 chronic kidney disease ([place]) Active Problems:   Essential hypertension   H/O kidney transplant   Dementia ([place])   Shaking   Hyperkalemia   HLD (hyperlipidemia)   AKI (acute kidney injury) ([place])   AKI on CKD IV: Baseline Cre is 1.9 recently. Cr continues to trend down. Likely due to dehydration, diuretics, NSAIDs. Nephro consulted & awaiting recs. Will continue to monitor   Metabolic acidosis: increase bicarb to BID  Positive UA: ID rec not starting abxs for positive UA and observing closely for signs of sepsis  HTN: continue on metoprolol. Hydralazine prn. Hold lasix secondary to AKI on CKD  Leukopenia: resolved  Hx kidney transplant:continue home mycophenolate and sirolimus  Likely ACD: likely secondary to CKD. No need for a transfusion currently. Will continue to monitor   Dementia: continue home donepezil  Shaking: etiology unclear. Possibly due to gabapentin use, hold off gabapentin per renal,Dr. lateef's recommendation.  Hyperkalemia: resolved. Will continue to monitor   HLD: continue statin  Generalized weakness: PT/OT consulted   DVT prophylaxis: heparin Code Status: full Family Communication:  Disposition Plan:    Consultants:   Nephro, ID   Procedures: n/a   Antimicrobials: none   Subjective: Pt c/o malaise  Objective: Vitals:   02/01/19 0606 02/01/19 0908 02/01/19 1634 02/01/19 2350  BP: (!) 157/94 (!) 113/53 139/73 (!) 149/95  Pulse: (!) 53 62 66 63  Resp: 17 16 16 18   Temp: 97.6 F (36.4 C) 97.6 F (36.4 C) 98.3 F (36.8 C) 98.2 F (36.8 C)  TempSrc: Oral Oral Oral Oral  SpO2: 100% 100% 100% 100%  Weight: 89.4 kg     Height: 4\' 11"  (1.499 m)       Intake/Output Summary (Last 24 hours) at 02/02/2019 0717 Last data  filed at 02/01/2019 1700 Gross per 24 hour  Intake 1287.99 ml  Output 250 ml  Net 1037.99 ml   Filed Weights   02/01/19 0606  Weight: 89.4 kg    Examination:  General exam: Appears calm and comfortable. Poor dentition Respiratory system: Clear to auscultation. Respiratory effort normal. Cardiovascular system: S1 & S2 +. No rubs, gallops   Gastrointestinal system: Abdomen is nondistended, soft and nontender.  Normal bowel sounds heard. Central nervous system: lethargic. Moves all 4 extremities Psychiatry: Judgement and insight abnormal, hx of dementia. Flat mood and affect    Data Reviewed: I have personally reviewed following labs and imaging studies  CBC: Recent Labs  Lab 01/31/19 1037 02/01/19 0832 02/02/19 0347  WBC 5.1 3.8* 5.9  HGB 10.3* 9.0* 9.8*  HCT 34.3* 28.6* 31.2*  MCV 83.1 77.3* 79.0*  PLT 167 155 322   Basic Metabolic Panel: Recent Labs  Lab 01/31/19 1037 02/01/19 0832 02/02/19 0347  NA 142 140 141  K 5.2* 4.4 4.4  CL 117* 116* 115*  CO2 17* 17* 18*  GLUCOSE 97 97 125*  BUN 66* 61* 56*  CREATININE 3.10* 2.49* 2.13*  CALCIUM 9.4 8.7* 9.2   GFR: Estimated Creatinine Clearance: 24.6 mL/min (A) (by C-G formula based on SCr of 2.13 mg/dL (H)). Liver Function Tests: Recent Labs  Lab 01/31/19 1037  AST 13*  ALT 10  ALKPHOS 43  BILITOT 0.4  PROT 7.4  ALBUMIN 3.6   No results for input(s): LIPASE,  AMYLASE in the last 168 hours. No results for input(s): AMMONIA in the last 168 hours. Coagulation Profile: No results for input(s): INR, PROTIME in the last 168 hours. Cardiac Enzymes: No results for input(s): CKTOTAL, CKMB, CKMBINDEX, TROPONINI in the last 168 hours. BNP (last 3 results) No results for input(s): PROBNP in the last 8760 hours. HbA1C: No results for input(s): HGBA1C in the last 72 hours. CBG: No results for input(s): GLUCAP in the last 168 hours. Lipid Profile: No results for input(s): CHOL, HDL, LDLCALC, TRIG, CHOLHDL,  LDLDIRECT in the last 72 hours. Thyroid Function Tests: No results for input(s): TSH, T4TOTAL, FREET4, T3FREE, THYROIDAB in the last 72 hours. Anemia Panel: No results for input(s): VITAMINB12, FOLATE, FERRITIN, TIBC, IRON, RETICCTPCT in the last 72 hours. Sepsis Labs: No results for input(s): PROCALCITON, LATICACIDVEN in the last 168 hours.  Recent Results (from the past 240 hour(s))  Today - UCx     Status: Abnormal   Collection Time: 01/31/19 10:37 AM   Specimen: Urine, Random  Result Value Ref Range Status   Specimen Description   Final    URINE, RANDOM Performed at [facility], [address], [place], [place] [address]    Special Requests   Final    NONE Performed at [facility], [address] [place], [place] [address]    Culture MULTIPLE SPECIES PRESENT, SUGGEST RECOLLECTION (A)  Final   Report Status 02/01/2019 FINAL  Final  CULTURE, BLOOD (ROUTINE X 2) w Reflex to ID Panel     Status: None (Preliminary result)   Collection Time: 01/31/19  3:35 PM   Specimen: BLOOD  Result Value Ref Range Status   Specimen Description BLOOD RIGHT ANTECUBITAL  Final   Special Requests   Final    BOTTLES DRAWN AEROBIC AND ANAEROBIC Blood Culture adequate volume   Culture   Final    NO GROWTH 2 DAYS Performed at [name], [address] [place], [place] [address]    Report Status PENDING  Incomplete  SARS CORONAVIRUS 2 (TAT 6-24 HRS) Nasopharyngeal Nasopharyngeal Swab     Status: None   Collection Time: 01/31/19  4:21 PM   Specimen: Nasopharyngeal Swab  Result Value Ref Range Status   SARS Coronavirus 2 NEGATIVE NEGATIVE Final    Comment: (NOTE) SARS-CoV-2 target nucleic acids are NOT DETECTED. The SARS-CoV-2 RNA is generally detectable in upper and lower respiratory specimens during the acute phase of infection. Negative results do not preclude SARS-CoV-2 infection, do not rule out co-infections with other pathogens, and should not be used as  the sole basis for treatment or other patient management decisions. Negative results must be combined with clinical observations, patient history, and epidemiological information. The expected result is Negative. Fact Sheet for Patients: SugarRoll.be Fact Sheet for Healthcare Providers: https://www.woods-mathews.com/ This test is not yet approved or cleared by the [place] FDA and  has been authorized for detection and/or diagnosis of SARS-CoV-2 by FDA under an Emergency Use Authorization (EUA). This EUA will remain  in effect (meaning this test can be used) for the duration of the COVID-19 declaration under Section 56 4(b)(1) of the Act, 21 U.S.C. section 360bbb-3(b)(1), unless the authorization is terminated or revoked sooner. Performed at [facility], [place] [address], [place], [place] [address]   CULTURE, BLOOD (ROUTINE X 2) w Reflex to ID Panel     Status: None (Preliminary result)   Collection Time: 01/31/19  4:22 PM   Specimen: BLOOD  Result Value  Ref Range Status   Specimen Description BLOOD BLOOD LEFT HAND  Final   Special Requests   Final    BOTTLES DRAWN AEROBIC AND ANAEROBIC Blood Culture adequate volume   Culture   Final    NO GROWTH 2 DAYS Performed at [facility], [address], [place], [place] [address]    Report Status PENDING  Incomplete         Radiology Studies: US Renal Transplant w/Doppler  Result Date: 01/31/2019 CLINICAL DATA:  Acute kidney injury, renal transplant recipient in 2010 EXAM: ULTRASOUND OF RENAL TRANSPLANT WITH RENAL DOPPLER ULTRASOUND TECHNIQUE: Ultrasound examination of the renal transplant was performed with gray-scale, color and duplex doppler evaluation. COMPARISON:  CT renal colic [id number]/66/4403 FINDINGS: Transplant kidney location: RLQ Transplant Kidney: Renal measurements: 11.3 x 5.1 x 4.2 cm = volume: 125.55mL. Normal in size and parenchymal echogenicity. No evidence of  mass. Mild calyceal dilatation resolved postvoiding, likely physiologic. No peri-transplant fluid collection seen. Color flow in the main renal artery:  Yes Color flow in the main renal vein:  Yes Duplex Doppler Evaluation: Main Renal Artery Resistive Index: 0.8 Venous waveform in main renal vein:  Present Intrarenal resistive index in upper pole:  0.7 (normal 0.6-0.8; equivocal 0.8-0.9; abnormal >= 0.9) Intrarenal resistive index in lower pole: 0.7 (normal 0.6-0.8; equivocal 0.8-0.9; abnormal >= 0.9) Bladder: Normal for degree of bladder distention. Other findings:  None. IMPRESSION: Resistive index in the main renal artery is borderline elevated at 0.81. This is a nonspecific finding in the absence of comparison Doppler evaluation. Such findings could be considered within the clinical context. Mild caliectasis in the transplant resolved with voiding. Likely physiologic. No other abnormality is seen. Electronically Signed   By: Lovena Le M.D.   On: 01/31/2019 17:43        Scheduled Meds: . atorvastatin  40 mg Oral Daily  . calcitRIOL  0.25 mcg Oral Daily  . calcium carbonate  1,000 mg Oral BID  . cholecalciferol  1,000 Units Oral Daily  . citalopram  20 mg Oral Daily  . donepezil  5 mg Oral QHS  . feeding supplement (ENSURE ENLIVE)  237 mL Oral BID BM  . folic acid  1 mg Oral BID  . heparin  5,000 Units Subcutaneous Q8H  . metoprolol tartrate  25 mg Oral BID  . mycophenolate  180 mg Oral BID  . sirolimus  3 mg Oral Daily  . sodium bicarbonate  650 mg Oral Daily  . traZODone  50 mg Oral QHS   Continuous Infusions: . sodium chloride 75 mL/hr at 02/02/19 0540     LOS: 1 day    Time spent: 33 mins    Wyvonnia Dusky, MD [facility] Pager [phone]  If 7PM-7AM, please contact night-coverage [email or web address] Password [email or web address] 02/02/2019, 7:17 AM

## 2019-02-02 NOTE — Progress Notes (Signed)
OT Cancellation Note  Patient Details Name: [name] MRN: [id number] DOB: [birth year]   Cancelled Treatment:    Reason Eval/Treat Not Completed: Fatigue/lethargy limiting ability to participate. Consult received, chart reviewed. Pt sleeping upon attempt, wakes to therapist's voice briefly but requires cues to maintain alertness. Pt reporting fatigue after PT. Intermittent appropriate responses to simple questions. Will re-attempt OT evaluation at later date/time as pt is able to tolerate and as schedule permits.   Jeni Salles, MPH, MS, OTR/L ascom [phone] 02/02/19, 12:44 PM

## 2019-02-02 NOTE — Consult Note (Signed)
[name] MRN: [id number] DOB/AGE: 05-16-1950 68 y.o. Primary Care Physician:Johnston, Chrystie Nose, MD Admit date: 01/31/2019 Chief Complaint:  Chief Complaint  Patient presents with  . Shaking   HPI: Patient is a 68 year old African-American female with a past medical history of ESRD s/p renal transplant [name] is a 68 y.o. female with medical history significant of kidney transplantation on immunosuppressants, hypertension, GERD, depression, uterine cancer, kidney cancer, dementia, anemia, C. difficile colitis, who was brought to the ER with a chief complaint of shaking.  History of present illness dates back to past few months started having intermittent tremors. Data as per the care giver, pt has been having intermittent body shaking in the past several months, which has worsened so patient was brought to the ER . Upon evaluation in ER pt was found to have positive urinalysis for UTI, hyperkalemia with potassium 5.2, worsening renal function.Patient was admitted for further treatment and nephrology was consulted Patient seen today. No  complaint of unilateral numbness or tingling his extremities. No complaint of facial droop or slurred speech.  Patient offers no specific complaints Patient offers no complaint of  chest pain/ shortness breath. No complaint of cough. No complaint of nausea/ vomiting,/diarrhea/ abdominal pain.        Past Medical History:  Diagnosis Date  . Anemia   . C. difficile colitis   . Cancer of kidney ([place])   . Dementia ([place])   . GERD (gastroesophageal reflux disease)   . Hypertension   . Renal disorder   . Uterine cancer ([place])        Family History  Problem Relation Age of Onset  . Stroke Mother   . Alcohol abuse Father   . Breast cancer Neg Hx     Social History:  reports that she has quit smoking. She has never used smokeless tobacco. She reports that she does not drink alcohol or use drugs.   Allergies:  Allergies  Allergen  Reactions  . Ace Inhibitors Cough  . Latex Swelling  . Lisinopril Cough    Medications Prior to Admission  Medication Sig Dispense Refill  . atorvastatin (LIPITOR) 40 MG tablet Take 40 mg by mouth daily.     . calcitRIOL (ROCALTROL) 0.25 MCG capsule Take 0.25 mcg by mouth daily.    . calcium elemental as carbonate (TUMS ULTRA 1000) 400 MG chewable tablet Chew 1,000 mg by mouth 2 (two) times daily.     . cholecalciferol (VITAMIN D3) 25 MCG (1000 UT) tablet Take 1,000 Units by mouth daily.    . citalopram (CELEXA) 20 MG tablet Take 20 mg by mouth daily.    Marland Kitchen donepezil (ARICEPT) 5 MG tablet Take 5 mg by mouth at bedtime.     . feeding supplement, ENSURE ENLIVE, (ENSURE ENLIVE) LIQD Take 237 mLs by mouth 2 (two) times daily between meals. 993 mL 12  . folic acid (FOLVITE) 1 MG tablet Take 1 mg by mouth 2 (two) times daily.     . furosemide (LASIX) 20 MG tablet Take 1 tablet (20 mg total) by mouth daily. (Patient taking differently: Take 20 mg by mouth every other day. ) 30 tablet 0  . gabapentin (NEURONTIN) 300 MG capsule Take 300 mg by mouth 3 (three) times daily.     . meloxicam (MOBIC) 15 MG tablet Take 15 mg by mouth daily.    . metoprolol tartrate (LOPRESSOR) 25 MG tablet Take 25 mg by mouth 2 (two) times daily.    . mycophenolate (MYFORTIC) 180 MG  EC tablet Take 180 mg by mouth 2 (two) times daily.    . potassium chloride (K-DUR) 10 MEQ tablet Take 2 tablets (20 mEq total) by mouth daily. ONLY WHILE TAKING LASIX (Patient taking differently: Take 20 mEq by mouth 2 (two) times daily. ) 30 tablet 0  . sirolimus (RAPAMUNE) 1 MG tablet Take 3 mg by mouth daily.     . traZODone (DESYREL) 50 MG tablet Take 50 mg by mouth at bedtime.          HCW:CBJSE from the symptoms mentioned above,there are no other symptoms referable to all systems reviewed.  Marland Kitchen atorvastatin  40 mg Oral Daily  . calcitRIOL  0.25 mcg Oral Daily  . calcium carbonate  1,000 mg Oral BID  . cholecalciferol  1,000 Units  Oral Daily  . citalopram  20 mg Oral Daily  . donepezil  5 mg Oral QHS  . feeding supplement (ENSURE ENLIVE)  237 mL Oral BID BM  . folic acid  1 mg Oral BID  . heparin  5,000 Units Subcutaneous Q8H  . metoprolol tartrate  25 mg Oral BID  . mycophenolate  180 mg Oral BID  . sirolimus  3 mg Oral Daily  . sodium bicarbonate  650 mg Oral BID  . traZODone  50 mg Oral QHS        Physical Exam: Vital signs in last 24 hours: Temp:  [98 F (36.7 C)-98.2 F (36.8 C)] 98 F (36.7 C) (12/19 1600) Pulse Rate:  [56-80] 56 (12/19 1600) Resp:  [18] 18 (12/18 2350) BP: (111-163)/(60-95) 111/60 (12/19 1600) SpO2:  [96 %-100 %] 96 % (12/19 1600) Weight change:  Last BM Date: (PTA)  Intake/Output from previous day: 12/18 0701 - 12/19 0700 In: 1288 [P.O.:240; I.V.:1048] Out: 250 [Urine:250] Total I/O In: 0  Out: 800 [Urine:800]   Physical Exam: General- pt is awake,alert, but is not oriented to time place and person Resp- No acute REsp distress, CTA B/L NO Rhonchi CVS- S1S2 regular in rate and rhythm GIT- BS+, soft, NT, ND EXT- NO LE Edema, Cyanosis CNS- CN 2-12 grossly intact. Moving all 4 extremities Psych- normal mod and affect Access-  AVF   Lab Results: CBC Recent Labs    02/01/19 0832 02/02/19 0347  WBC 3.8* 5.9  HGB 9.0* 9.8*  HCT 28.6* 31.2*  PLT 155 164    BMET Recent Labs    02/01/19 0832 02/02/19 0347  NA 140 141  K 4.4 4.4  CL 116* 115*  CO2 17* 18*  GLUCOSE 97 125*  BUN 61* 56*  CREATININE 2.49* 2.13*  CALCIUM 8.7* 9.2   Creatinine trend reviewed from data in care everywhere 2020 2.5==> 2.1 As outpatient baseline 1.7-- 2.17 2019 1.3--1.4 2018  1.2--1.5 2017   1.5--4.9-acute kidney injury in April 2017 2015 1.3 at [facility] 2014 1.1-1.3 2013 1.2--1.5 2010 1.6--1.9   Potassium trend 5.2==> 4.4  MICRO Recent Results (from the past 240 hour(s))  Today - UCx     Status: Abnormal   Collection Time: 01/31/19 10:37 AM   Specimen: Urine,  Random  Result Value Ref Range Status   Specimen Description   Final    URINE, RANDOM Performed at [facility], [address] [place], [place] [address]    Special Requests   Final    NONE Performed at [facility], [address], [place], [place] [address]    Culture MULTIPLE SPECIES PRESENT, SUGGEST RECOLLECTION (A)  Final   Report Status 02/01/2019  FINAL  Final  CULTURE, BLOOD (ROUTINE X 2) w Reflex to ID Panel     Status: None (Preliminary result)   Collection Time: 01/31/19  3:35 PM   Specimen: BLOOD  Result Value Ref Range Status   Specimen Description BLOOD RIGHT ANTECUBITAL  Final   Special Requests   Final    BOTTLES DRAWN AEROBIC AND ANAEROBIC Blood Culture adequate volume   Culture   Final    NO GROWTH 2 DAYS Performed at [facility], [place], [place] [address]    Report Status PENDING  Incomplete  SARS CORONAVIRUS 2 (TAT 6-24 HRS) Nasopharyngeal Nasopharyngeal Swab     Status: None   Collection Time: 01/31/19  4:21 PM   Specimen: Nasopharyngeal Swab  Result Value Ref Range Status   SARS Coronavirus 2 NEGATIVE NEGATIVE Final    Comment: (NOTE) SARS-CoV-2 target nucleic acids are NOT DETECTED. The SARS-CoV-2 RNA is generally detectable in upper and lower respiratory specimens during the acute phase of infection. Negative results do not preclude SARS-CoV-2 infection, do not rule out co-infections with other pathogens, and should not be used as the sole basis for treatment or other patient management decisions. Negative results must be combined with clinical observations, patient history, and epidemiological information. The expected result is Negative. Fact Sheet for Patients: SugarRoll.be Fact Sheet for Healthcare Providers: https://www.woods-mathews.com/ This test is not yet approved or cleared by the [place] FDA and  has been authorized for detection and/or  diagnosis of SARS-CoV-2 by FDA under an Emergency Use Authorization (EUA). This EUA will remain  in effect (meaning this test can be used) for the duration of the COVID-19 declaration under Section 56 4(b)(1) of the Act, 21 U.S.C. section 360bbb-3(b)(1), unless the authorization is terminated or revoked sooner. Performed at [facility], [place] [address] [place], [place] [address]   CULTURE, BLOOD (ROUTINE X 2) w Reflex to ID Panel     Status: None (Preliminary result)   Collection Time: 01/31/19  4:22 PM   Specimen: BLOOD  Result Value Ref Range Status   Specimen Description BLOOD BLOOD LEFT HAND  Final   Special Requests   Final    BOTTLES DRAWN AEROBIC AND ANAEROBIC Blood Culture adequate volume   Culture   Final    NO GROWTH 2 DAYS Performed at [facility], [address] [place],  [address]    Report Status PENDING  Incomplete      Lab Results  Component Value Date   CALCIUM 9.2 02/02/2019   PHOS 4.0 12/01/2018      Impression:   Patient is a 68 year old African-American female with a past medical history of ESRD patient is s/p renal transplant done in October 2009, hypertension, history of renal cell carcinoma patient is s/p left nephrectomy, history of s/p cholecystectomy, history of s/p hysterectomy, secondary hyperparathyroidism anemia of chronic disease and hypertension    1)Renal  AKI AKI on CKD Patient AKI is now better Creatinine is trending down Patient has history of End-stage renal disease status post transplant Patient is s/p transplant deceased donor kidney done in 2007-05-31 at [facility] Patient is now CKD stage IV Patient GFR is at her baseline  Immune suppressant regimen Mycophenolate 180 mg p.o. twice daily Sirolimus 3 mg daily   2)HTN  BP is at goal  Medication- On Beta blockers   3)Anemia of chronic disease  HGb is at goal (9--11)   4)CKD Mineral-Bone Disorder  Secondary Hyperparathyroidism  present  Patient is on calcitriol Phosphorus at goal.   5)Hyperkalemia Potassium now better   6) tremors/shaking Patient is clinically better   7)Acid base Co2 not at goal This is most likely secondary to CKD  Patient on p.o. bicarb  8) Leukopenia WBc counts better    Plan:  Agree with current tx and plan Will continue current immunosuppression regimen Will follow BMET      [name] s [place] 02/02/2019, 6:30 PM

## 2019-02-03 DIAGNOSIS — E7849 Other hyperlipidemia: Secondary | ICD-10-CM

## 2019-02-03 LAB — CBC
HCT: 29.4 % — ABNORMAL LOW (ref 36.0–46.0)
Hemoglobin: 8.9 g/dL — ABNORMAL LOW (ref 12.0–15.0)
MCH: 24.4 pg — ABNORMAL LOW (ref 26.0–34.0)
MCHC: 30.3 g/dL (ref 30.0–36.0)
MCV: 80.5 fL (ref 80.0–100.0)
Platelets: 151 10*3/uL (ref 150–400)
RBC: 3.65 MIL/uL — ABNORMAL LOW (ref 3.87–5.11)
RDW: 15.2 % (ref 11.5–15.5)
WBC: 4.4 10*3/uL (ref 4.0–10.5)
nRBC: 0 % (ref 0.0–0.2)

## 2019-02-03 LAB — BASIC METABOLIC PANEL
Anion gap: 6 (ref 5–15)
BUN: 40 mg/dL — ABNORMAL HIGH (ref 8–23)
CO2: 19 mmol/L — ABNORMAL LOW (ref 22–32)
Calcium: 8.6 mg/dL — ABNORMAL LOW (ref 8.9–10.3)
Chloride: 115 mmol/L — ABNORMAL HIGH (ref 98–111)
Creatinine, Ser: 1.91 mg/dL — ABNORMAL HIGH (ref 0.44–1.00)
GFR calc Af Amer: 31 mL/min — ABNORMAL LOW (ref >60)
GFR calc non Af Amer: 26 mL/min — ABNORMAL LOW (ref >60)
Glucose, Bld: 104 mg/dL — ABNORMAL HIGH (ref 70–99)
Potassium: 4 mmol/L (ref 3.5–5.1)
Sodium: 140 mmol/L (ref 135–145)

## 2019-02-03 NOTE — Progress Notes (Signed)
[name]  MRN: [id number]  DOB/AGE: 68/68/1952 68 y.o.  Primary Care Physician:Johnston, Chrystie Nose, MD  Admit date: 01/31/2019  Chief Complaint:  Chief Complaint  Patient presents with  . Shaking    S-Pt presented on  01/31/2019 with  Chief Complaint  Patient presents with  . Shaking  .    Pt today feels better  Medications . atorvastatin  40 mg Oral Daily  . calcitRIOL  0.25 mcg Oral Daily  . calcium carbonate  1,000 mg Oral BID  . cholecalciferol  1,000 Units Oral Daily  . citalopram  20 mg Oral Daily  . donepezil  5 mg Oral QHS  . feeding supplement (ENSURE ENLIVE)  237 mL Oral BID BM  . folic acid  1 mg Oral BID  . heparin  5,000 Units Subcutaneous Q8H  . metoprolol tartrate  25 mg Oral BID  . mycophenolate  180 mg Oral BID  . sirolimus  3 mg Oral Daily  . sodium bicarbonate  650 mg Oral BID  . traZODone  50 mg Oral QHS         NTZ:GYFVC from the symptoms mentioned above,there are no other symptoms referable to all systems reviewed.  Physical Exam: Vital signs in last 24 hours: Temp:  [97.9 F (36.6 C)-98.4 F (36.9 C)] 98.4 F (36.9 C) (12/20 0930) Pulse Rate:  [56-69] 61 (12/20 0930) Resp:  [16-18] 16 (12/19 2324) BP: (111-155)/(60-78) 155/65 (12/20 0930) SpO2:  [96 %-100 %] 100 % (12/20 0930) Weight change:  Last BM Date: 02/02/19  Intake/Output from previous day: 12/19 0701 - 12/20 0700 In: 2438.5 [I.V.:2438.5] Out: 1000 [Urine:1000] No intake/output data recorded.   Physical Exam: General- pt is awake,alert, oriented to time place and person Resp- No acute REsp distress, CTA B/L NO Rhonchi CVS- S1S2 regular in rate and rhythm GIT- BS+, soft, NT, ND EXT- NO LE Edema, Cyanosis Access AV fistula in the left arm  Lab Results: CBC Recent Labs    02/02/19 0347 02/03/19 0349  WBC 5.9 4.4  HGB 9.8* 8.9*  HCT 31.2* 29.4*  PLT 164 151    BMET Recent Labs    02/02/19 0347 02/03/19 0349  NA 141 140  K 4.4 4.0  CL 115* 115*   CO2 18* 19*  GLUCOSE 125* 104*  BUN 56* 40*  CREATININE 2.13* 1.91*  CALCIUM 9.2 8.6*    Creatinine trend reviewed from data in care everywhere 2020 2.5==> 2.1==> 1.9 As outpatient baseline 1.7-- 2.17 2019 1.3--1.4 2018  1.2--1.5 2017   1.5--4.9-acute kidney injury in April 2017 2015 1.3 at [facility] 2014 1.1-1.3 2013 1.2--1.5 2010 1.6--1.9   Potassium trend 5.2==> 4.4==>4.0  MICRO Recent Results (from the past 240 hour(s))  Today - UCx     Status: Abnormal   Collection Time: 01/31/19 10:37 AM   Specimen: Urine, Random  Result Value Ref Range Status   Specimen Description   Final    URINE, RANDOM Performed at [facility], [address], [place], [place] [address]    Special Requests   Final    NONE Performed at [facility], [address] [place], [place] [address]    Culture MULTIPLE SPECIES PRESENT, SUGGEST RECOLLECTION (A)  Final   Report Status 02/01/2019 FINAL  Final  CULTURE, BLOOD (ROUTINE X 2) w Reflex to ID Panel     Status: None (Preliminary result)   Collection Time: 01/31/19  3:35 PM   Specimen: BLOOD  Result Value Ref Range Status  Specimen Description BLOOD RIGHT ANTECUBITAL  Final   Special Requests   Final    BOTTLES DRAWN AEROBIC AND ANAEROBIC Blood Culture adequate volume   Culture   Final    NO GROWTH 3 DAYS Performed at [facility], [place], [place] [address]    Report Status PENDING  Incomplete  SARS CORONAVIRUS 2 (TAT 6-24 HRS) Nasopharyngeal Nasopharyngeal Swab     Status: None   Collection Time: 01/31/19  4:21 PM   Specimen: Nasopharyngeal Swab  Result Value Ref Range Status   SARS Coronavirus 2 NEGATIVE NEGATIVE Final    Comment: (NOTE) SARS-CoV-2 target nucleic acids are NOT DETECTED. The SARS-CoV-2 RNA is generally detectable in upper and lower respiratory specimens during the acute phase of infection. Negative results do not preclude SARS-CoV-2 infection, do not rule  out co-infections with other pathogens, and should not be used as the sole basis for treatment or other patient management decisions. Negative results must be combined with clinical observations, patient history, and epidemiological information. The expected result is Negative. Fact Sheet for Patients: SugarRoll.be Fact Sheet for Healthcare Providers: https://www.woods-mathews.com/ This test is not yet approved or cleared by the [place] FDA and  has been authorized for detection and/or diagnosis of SARS-CoV-2 by FDA under an Emergency Use Authorization (EUA). This EUA will remain  in effect (meaning this test can be used) for the duration of the COVID-19 declaration under Section 56 4(b)(1) of the Act, 21 U.S.C. section 360bbb-3(b)(1), unless the authorization is terminated or revoked sooner. Performed at [facility], [place] [address] [place], [place] [address]   CULTURE, BLOOD (ROUTINE X 2) w Reflex to ID Panel     Status: None (Preliminary result)   Collection Time: 01/31/19  4:22 PM   Specimen: BLOOD  Result Value Ref Range Status   Specimen Description BLOOD BLOOD LEFT HAND  Final   Special Requests   Final    BOTTLES DRAWN AEROBIC AND ANAEROBIC Blood Culture adequate volume   Culture   Final    NO GROWTH 3 DAYS Performed at [facility], [place], [place] [address]    Report Status PENDING  Incomplete      Lab Results  Component Value Date   CALCIUM 8.6 (L) 02/03/2019   PHOS 4.0 12/01/2018               Impression: Patient is a 68 year old African-American female with a past medical history of ESRD patient is s/p renal transplant done in October 2009, hypertension, history of renal cell carcinoma patient is s/p left nephrectomy, history of s/p cholecystectomy, history of s/p hysterectomy, secondary hyperparathyroidism anemia of chronic disease and hypertension    1)Renal   AKI AKI on CKD Patient AKI is now better Creatinine is trending down Patient creatinine is now near to baseline Patient has history of End-stage renal disease status post transplant Patient is s/p transplant deceased donor kidney done in 2007-05-27 at [name] Patient is now CKD stage IV Patient GFR is at her baseline  Immune suppressant regimen Mycophenolate 180 mg p.o. twice daily Sirolimus 3 mg daily   2)HTN  BP is at goal  Medication- On Beta blockers   3)Anemia of chronic disease  HGb is at goal (9--11)   4)CKD Mineral-Bone Disorder  Secondary Hyperparathyroidism  present  Patient is on calcitriol Phosphorus at goal.   5)Hyperkalemia Potassium now better   6) tremors/shaking Patient is clinically better after holding Gabapentin  7)Acid base Co2 not at goal This is most likely secondary to CKD  Patient on p.o. bicarb Bicarb levels are improving slowly  8) Leukopenia WBc counts better    Plan:  Agree with current tx and plan Will continue current immunosuppression regimen Will follow BMET    [name] s Marisella Puccio 02/03/2019, 3:26 PM

## 2019-02-03 NOTE — Progress Notes (Signed)
Spoke with husband Marland Kitchen). Educated husband about patient's current care plan and treatment recommendations. Husband verbalized understanding, but shared some concerns about SNF being a consideration.

## 2019-02-03 NOTE — Progress Notes (Signed)
PROGRESS NOTE    [name]  QAS:[id number] DOB: March 12, 1950 DOA: 01/31/2019 PCP: Baxter Hire, MD       Assessment & Plan:   Principal Problem:   Acute renal failure superimposed on stage 4 chronic kidney disease ([place]) Active Problems:   Essential hypertension   H/O kidney transplant   Dementia ([place])   Shaking   Hyperkalemia   HLD (hyperlipidemia)   AKI (acute kidney injury) ([place])   AKI on CKD IV: Baseline Cre is 1.9 recently. Cr is trending down daily. Likely due to dehydration, diuretics, NSAIDs. Nephro following and recs apprec. Will continue to monitor   Metabolic acidosis: slight improvement. Continue on bicarb  Positive UA: ID rec not starting abxs for positive UA and observing closely for signs of sepsis  HTN: continue on metoprolol. Hydralazine prn. Hold lasix secondary to AKI on CKD  Leukopenia: resolved  Hx kidney transplant:continue home mycophenolate and sirolimus  Likely ACD: likely secondary to CKD. No need for a transfusion currently. Will continue to monitor   Dementia: continue home donepezil  Shaking: etiology unclear. Possibly due to gabapentin use, hold off gabapentin per renal,Dr. lateef's recommendation.  Hyperkalemia: resolved. Will continue to monitor   HLD: continue statin  Generalized weakness: PT recs SNF. OT pending. CM is aware    DVT prophylaxis: heparin Code Status: full Family Communication:  Disposition Plan:    Consultants:   Nephro   Procedures: n/a   Antimicrobials: none   Subjective: Pt c/o fatigue  Objective: Vitals:   02/02/19 0739 02/02/19 1600 02/02/19 2135 02/02/19 2324  BP: (!) 163/72 111/60 122/65 (!) 142/78  Pulse: 80 (!) 56 69 (!) 56  Resp:   18 16  Temp: 98 F (36.7 C) 98 F (36.7 C) 98.4 F (36.9 C) 97.9 F (36.6 C)  TempSrc: Oral Axillary Oral Oral  SpO2: 97% 96% 99% 99%  Weight:      Height:        Intake/Output Summary (Last 24 hours) at 02/03/2019 0718 Last data  filed at 02/03/2019 0300 Gross per 24 hour  Intake 2438.47 ml  Output 1000 ml  Net 1438.47 ml   Filed Weights   02/01/19 0606  Weight: 89.4 kg    Examination:  General exam: Appears calm and comfortable. Poor dentition Respiratory system: Clear to auscultation. Respiratory effort normal. No wheezes, rales Cardiovascular system: S1 & S2 +. No rubs, gallops   Gastrointestinal system: Abdomen is nondistended, soft and nontender.  Normal bowel sounds heard. Central nervous system: Alert and awake. Moves all 4 extremities Psychiatry: Judgement and insight abnormal, hx of dementia. Flat mood and affect    Data Reviewed: I have personally reviewed following labs and imaging studies  CBC: Recent Labs  Lab 01/31/19 1037 02/01/19 0832 02/02/19 0347 02/03/19 0349  WBC 5.1 3.8* 5.9 4.4  HGB 10.3* 9.0* 9.8* 8.9*  HCT 34.3* 28.6* 31.2* 29.4*  MCV 83.1 77.3* 79.0* 80.5  PLT 167 155 164 798   Basic Metabolic Panel: Recent Labs  Lab 01/31/19 1037 02/01/19 0832 02/02/19 0347 02/03/19 0349  NA 142 140 141 140  K 5.2* 4.4 4.4 4.0  CL 117* 116* 115* 115*  CO2 17* 17* 18* 19*  GLUCOSE 97 97 125* 104*  BUN 66* 61* 56* 40*  CREATININE 3.10* 2.49* 2.13* 1.91*  CALCIUM 9.4 8.7* 9.2 8.6*   GFR: Estimated Creatinine Clearance: 27.5 mL/min (A) (by C-G formula based on SCr of 1.91 mg/dL (H)). Liver Function Tests: Recent Labs  Lab 01/31/19 1037  AST 13*  ALT 10  ALKPHOS 43  BILITOT 0.4  PROT 7.4  ALBUMIN 3.6   No results for input(s): LIPASE, AMYLASE in the last 168 hours. No results for input(s): AMMONIA in the last 168 hours. Coagulation Profile: No results for input(s): INR, PROTIME in the last 168 hours. Cardiac Enzymes: No results for input(s): CKTOTAL, CKMB, CKMBINDEX, TROPONINI in the last 168 hours. BNP (last 3 results) No results for input(s): PROBNP in the last 8760 hours. HbA1C: No results for input(s): HGBA1C in the last 72 hours. CBG: No results for  input(s): GLUCAP in the last 168 hours. Lipid Profile: No results for input(s): CHOL, HDL, LDLCALC, TRIG, CHOLHDL, LDLDIRECT in the last 72 hours. Thyroid Function Tests: No results for input(s): TSH, T4TOTAL, FREET4, T3FREE, THYROIDAB in the last 72 hours. Anemia Panel: No results for input(s): VITAMINB12, FOLATE, FERRITIN, TIBC, IRON, RETICCTPCT in the last 72 hours. Sepsis Labs: No results for input(s): PROCALCITON, LATICACIDVEN in the last 168 hours.  Recent Results (from the past 240 hour(s))  Today - UCx     Status: Abnormal   Collection Time: 01/31/19 10:37 AM   Specimen: Urine, Random  Result Value Ref Range Status   Specimen Description   Final    URINE, RANDOM Performed at [facility], [address] [place], [place] [address]    Special Requests   Final    NONE Performed at [facility], [address] [place], [place] [address]    Culture MULTIPLE SPECIES PRESENT, SUGGEST RECOLLECTION (A)  Final   Report Status 02/01/2019 FINAL  Final  CULTURE, BLOOD (ROUTINE X 2) w Reflex to ID Panel     Status: None (Preliminary result)   Collection Time: 01/31/19  3:35 PM   Specimen: BLOOD  Result Value Ref Range Status   Specimen Description BLOOD RIGHT ANTECUBITAL  Final   Special Requests   Final    BOTTLES DRAWN AEROBIC AND ANAEROBIC Blood Culture adequate volume   Culture   Final    NO GROWTH 3 DAYS Performed at [facility], [address] [place], [place] [address]    Report Status PENDING  Incomplete  SARS CORONAVIRUS 2 (TAT 6-24 HRS) Nasopharyngeal Nasopharyngeal Swab     Status: None   Collection Time: 01/31/19  4:21 PM   Specimen: Nasopharyngeal Swab  Result Value Ref Range Status   SARS Coronavirus 2 NEGATIVE NEGATIVE Final    Comment: (NOTE) SARS-CoV-2 target nucleic acids are NOT DETECTED. The SARS-CoV-2 RNA is generally detectable in upper and lower respiratory specimens during the acute phase of infection. Negative results  do not preclude SARS-CoV-2 infection, do not rule out co-infections with other pathogens, and should not be used as the sole basis for treatment or other patient management decisions. Negative results must be combined with clinical observations, patient history, and epidemiological information. The expected result is Negative. Fact Sheet for Patients: SugarRoll.be Fact Sheet for Healthcare Providers: https://www.woods-mathews.com/ This test is not yet approved or cleared by the [place] FDA and  has been authorized for detection and/or diagnosis of SARS-CoV-2 by FDA under an Emergency Use Authorization (EUA). This EUA will remain  in effect (meaning this test can be used) for the duration of the COVID-19 declaration under Section 56 4(b)(1) of the Act, 21 U.S.C. section 360bbb-3(b)(1), unless the authorization is terminated or revoked sooner. Performed at [facility], [place] [address], [place], [place] [address]   CULTURE, BLOOD (ROUTINE X 2) w Reflex to ID  Panel     Status: None (Preliminary result)   Collection Time: 01/31/19  4:22 PM   Specimen: BLOOD  Result Value Ref Range Status   Specimen Description BLOOD BLOOD LEFT HAND  Final   Special Requests   Final    BOTTLES DRAWN AEROBIC AND ANAEROBIC Blood Culture adequate volume   Culture   Final    NO GROWTH 3 DAYS Performed at [facility], [address], [place], [place] [address]    Report Status PENDING  Incomplete         Radiology Studies: No results found.      Scheduled Meds: . atorvastatin  40 mg Oral Daily  . calcitRIOL  0.25 mcg Oral Daily  . calcium carbonate  1,000 mg Oral BID  . cholecalciferol  1,000 Units Oral Daily  . citalopram  20 mg Oral Daily  . donepezil  5 mg Oral QHS  . feeding supplement (ENSURE ENLIVE)  237 mL Oral BID BM  . folic acid  1 mg Oral BID  . heparin  5,000 Units Subcutaneous Q8H  . metoprolol tartrate  25 mg Oral  BID  . mycophenolate  180 mg Oral BID  . sirolimus  3 mg Oral Daily  . sodium bicarbonate  650 mg Oral BID  . traZODone  50 mg Oral QHS   Continuous Infusions: . sodium chloride 75 mL/hr at 02/02/19 2131     LOS: 2 days    Time spent: 30 mins    Wyvonnia Dusky, MD [facility] Pager [phone]  If 7PM-7AM, please contact night-coverage [email or web address] Password [facility] 02/03/2019, 7:18 AM

## 2019-02-03 NOTE — Progress Notes (Signed)
Spoke with daughter Rogers Seeds). Educated daughter about patient's current care plan and treatment team recommendations. Daughter verbalized understanding.

## 2019-02-04 DIAGNOSIS — L8990 Pressure ulcer of unspecified site, unspecified stage: Secondary | ICD-10-CM | POA: Insufficient documentation

## 2019-02-04 LAB — BASIC METABOLIC PANEL
Anion gap: 7 (ref 5–15)
BUN: 34 mg/dL — ABNORMAL HIGH (ref 8–23)
CO2: 20 mmol/L — ABNORMAL LOW (ref 22–32)
Calcium: 8.8 mg/dL — ABNORMAL LOW (ref 8.9–10.3)
Chloride: 116 mmol/L — ABNORMAL HIGH (ref 98–111)
Creatinine, Ser: 1.82 mg/dL — ABNORMAL HIGH (ref 0.44–1.00)
GFR calc Af Amer: 33 mL/min — ABNORMAL LOW (ref >60)
GFR calc non Af Amer: 28 mL/min — ABNORMAL LOW (ref >60)
Glucose, Bld: 90 mg/dL (ref 70–99)
Potassium: 4 mmol/L (ref 3.5–5.1)
Sodium: 143 mmol/L (ref 135–145)

## 2019-02-04 LAB — CBC
HCT: 31.3 % — ABNORMAL LOW (ref 36.0–46.0)
Hemoglobin: 9.3 g/dL — ABNORMAL LOW (ref 12.0–15.0)
MCH: 24.3 pg — ABNORMAL LOW (ref 26.0–34.0)
MCHC: 29.7 g/dL — ABNORMAL LOW (ref 30.0–36.0)
MCV: 81.7 fL (ref 80.0–100.0)
Platelets: 157 10*3/uL (ref 150–400)
RBC: 3.83 MIL/uL — ABNORMAL LOW (ref 3.87–5.11)
RDW: 15.1 % (ref 11.5–15.5)
WBC: 4.8 10*3/uL (ref 4.0–10.5)
nRBC: 0 % (ref 0.0–0.2)

## 2019-02-04 LAB — SARS CORONAVIRUS 2 (TAT 6-24 HRS): SARS Coronavirus 2: NEGATIVE

## 2019-02-04 NOTE — TOC Progression Note (Signed)
Transition of Care [facility]) - Progression Note    Patient Details  Name: [name] MRN: [id number] Date of Birth: [birth year]  [facility] CM/SW Contact  Su Hilt, RN Phone Number: 02/04/2019, 11:03 AM  Clinical Narrative:    Spoke with the patient's daughter [name] on the phone, she is agreeable to have the patient go to SNF for Short term rehab at [place], I explained that I would do a bed search then once beds are obtained We would discuss them and she could pick one.  I Notified the physician that a new covid test is needed        Expected Discharge Plan and Services                                                 Social Determinants of Health (SDOH) Interventions    Readmission Risk Interventions Readmission Risk Prevention Plan 12/02/2018 12/01/2018 08/15/2018  Transportation Screening Complete Complete Complete  PCP or Specialist Appt within 3-5 Days - - Complete  HRI or Home Care Consult - - Complete  Palliative Care Screening - - Not Applicable  Medication Review (RN Care Manager) Complete - Complete  PCP or Specialist appointment within 3-5 days of discharge Complete - -  [place] or Home Care Consult Complete Complete -  SW Recovery Care/Counseling Consult Patient refused - -  Palliative Care Screening Not Applicable - -  [place] Not Applicable Patient Refused -  Some recent data might be hidden

## 2019-02-04 NOTE — Progress Notes (Signed)
Patient also refused dinner.  Clarise Cruz, BSN

## 2019-02-04 NOTE — TOC Progression Note (Signed)
Transition of Care [facility]) - Progression Note    Patient Details  Name: Henretter Piekarski MRN: [id number] Date of Birth: 1950-09-07  Transition of Care [facility]) CM/SW [place], RN Phone Number: 02/04/2019, 2:32 PM  Clinical Narrative:    Faxed H&P, FL2 and Progress notes as well as 30 day note to [facility]        Expected Discharge Plan and Services                                                 Social Determinants of Health (SDOH) Interventions    Readmission Risk Interventions Readmission Risk Prevention Plan 12/02/2018 12/01/2018 08/15/2018  Transportation Screening Complete Complete Complete  PCP or Specialist Appt within 3-5 Days - - Complete  HRI or Home Care Consult - - Complete  Palliative Care Screening - - Not Applicable  Medication Review ([place]) Complete - Complete  PCP or Specialist appointment within 3-5 days of discharge Complete - -  [place] or Home Care Consult Complete Complete -  SW Recovery Care/Counseling Consult Patient refused - -  Palliative Care Screening Not Applicable - -  [place] Not Applicable Patient Refused -  Some recent data might be hidden

## 2019-02-04 NOTE — Progress Notes (Signed)
PROGRESS NOTE    [name]  EZM:[id number] DOB: Sep 05, 1950 DOA: 01/31/2019 PCP: Baxter Hire, MD       Assessment & Plan:   Principal Problem:   Acute renal failure superimposed on stage 4 chronic kidney disease ([place]) Active Problems:   Essential hypertension   H/O kidney transplant   Dementia ([place])   Shaking   Hyperkalemia   HLD (hyperlipidemia)   AKI (acute kidney injury) ([place])   AKI on CKD IV: Baseline Cre is 1.9 recently. Cr is back at baseline as per nephro. Likely due to dehydration, diuretics, NSAIDs. Nephro following and recs apprec. Will continue to monitor   Metabolic acidosis: continue on bicarb. Improving  Positive UA: ID rec not starting abxs for positive UA and observing closely for signs of sepsis  HTN: continue on metoprolol. Hydralazine prn. Hold lasix secondary to AKI on CKD  Leukopenia: resolved  Hx kidney transplant:continue home mycophenolate and sirolimus  Likely ACD: likely secondary to CKD. No need for a transfusion currently. Will continue to monitor   Dementia: continue home donepezil  Shaking: etiology unclear. Possibly due to gabapentin use, hold off gabapentin per renal,Dr. lateef's recommendation.  Hyperkalemia: resolved. Will continue to monitor   HLD: continue statin  Generalized weakness: PT recs SNF. OT pending. CM is aware    DVT prophylaxis: heparin Code Status: full Family Communication: pt's daughter, [name], agreed to SNF; CM is working on this. COVID19 test is pending for SNF placement Disposition Plan: COVID19 test is pending for SNF placement. Pt is stable for d/c    Consultants:   Nephro   Procedures: n/a   Antimicrobials: none   Subjective: Pt c/o malaise  Objective: Vitals:   02/03/19 0930 02/03/19 2129 02/03/19 2130 02/03/19 2220  BP: (!) 155/65 97/65 121/65 127/62  Pulse: 61 84 70 (!) 52  Resp:  20  20  Temp: 98.4 F (36.9 C)   99.3 F (37.4 C)  TempSrc: Oral     SpO2: 100%  100%  100%  Weight:      Height:        Intake/Output Summary (Last 24 hours) at 02/04/2019 0713 Last data filed at 02/04/2019 0158 Gross per 24 hour  Intake 1661.67 ml  Output 600 ml  Net 1061.67 ml   Filed Weights   02/01/19 0606  Weight: 89.4 kg    Examination:  General exam: Appears calm and comfortable. Poor dentition Respiratory system: Clear to auscultation. Respiratory effort normal. No wheezes, rhonchi Cardiovascular system: S1 & S2 +. No rubs, gallops   Gastrointestinal system: Abdomen is nondistended, soft and nontender.  Normal bowel sounds heard. Central nervous system: Alert and awake. Moves all 4 extremities Psychiatry: Judgement and insight abnormal, hx of dementia. Normal mood and affect    Data Reviewed: I have personally reviewed following labs and imaging studies  CBC: Recent Labs  Lab 01/31/19 1037 02/01/19 0832 02/02/19 0347 02/03/19 0349 02/04/19 0355  WBC 5.1 3.8* 5.9 4.4 4.8  HGB 10.3* 9.0* 9.8* 8.9* 9.3*  HCT 34.3* 28.6* 31.2* 29.4* 31.3*  MCV 83.1 77.3* 79.0* 80.5 81.7  PLT 167 155 164 151 503   Basic Metabolic Panel: Recent Labs  Lab 01/31/19 1037 02/01/19 0832 02/02/19 0347 02/03/19 0349 02/04/19 0355  NA 142 140 141 140 143  K 5.2* 4.4 4.4 4.0 4.0  CL 117* 116* 115* 115* 116*  CO2 17* 17* 18* 19* 20*  GLUCOSE 97 97 125* 104* 90  BUN 66* 61* 56* 40* 34*  CREATININE  3.10* 2.49* 2.13* 1.91* 1.82*  CALCIUM 9.4 8.7* 9.2 8.6* 8.8*   GFR: Estimated Creatinine Clearance: 28.8 mL/min (A) (by C-G formula based on SCr of 1.82 mg/dL (H)). Liver Function Tests: Recent Labs  Lab 01/31/19 1037  AST 13*  ALT 10  ALKPHOS 43  BILITOT 0.4  PROT 7.4  ALBUMIN 3.6   No results for input(s): LIPASE, AMYLASE in the last 168 hours. No results for input(s): AMMONIA in the last 168 hours. Coagulation Profile: No results for input(s): INR, PROTIME in the last 168 hours. Cardiac Enzymes: No results for input(s): CKTOTAL, CKMB, CKMBINDEX,  TROPONINI in the last 168 hours. BNP (last 3 results) No results for input(s): PROBNP in the last 8760 hours. HbA1C: No results for input(s): HGBA1C in the last 72 hours. CBG: No results for input(s): GLUCAP in the last 168 hours. Lipid Profile: No results for input(s): CHOL, HDL, LDLCALC, TRIG, CHOLHDL, LDLDIRECT in the last 72 hours. Thyroid Function Tests: No results for input(s): TSH, T4TOTAL, FREET4, T3FREE, THYROIDAB in the last 72 hours. Anemia Panel: No results for input(s): VITAMINB12, FOLATE, FERRITIN, TIBC, IRON, RETICCTPCT in the last 72 hours. Sepsis Labs: No results for input(s): PROCALCITON, LATICACIDVEN in the last 168 hours.  Recent Results (from the past 240 hour(s))  Today - UCx     Status: Abnormal   Collection Time: 01/31/19 10:37 AM   Specimen: Urine, Random  Result Value Ref Range Status   Specimen Description   Final    URINE, RANDOM Performed at [facility], [address], [place], [place] [address]    Special Requests   Final    NONE Performed at [facility], [address] [place], [place] [address]    Culture MULTIPLE SPECIES PRESENT, SUGGEST RECOLLECTION (A)  Final   Report Status 02/01/2019 FINAL  Final  CULTURE, BLOOD (ROUTINE X 2) w Reflex to ID Panel     Status: None (Preliminary result)   Collection Time: 01/31/19  3:35 PM   Specimen: BLOOD  Result Value Ref Range Status   Specimen Description BLOOD RIGHT ANTECUBITAL  Final   Special Requests   Final    BOTTLES DRAWN AEROBIC AND ANAEROBIC Blood Culture adequate volume   Culture   Final    NO GROWTH 3 DAYS Performed at [facility], [address], [place], [place] [address]    Report Status PENDING  Incomplete  SARS CORONAVIRUS 2 (TAT 6-24 HRS) Nasopharyngeal Nasopharyngeal Swab     Status: None   Collection Time: 01/31/19  4:21 PM   Specimen: Nasopharyngeal Swab  Result Value Ref Range Status   SARS Coronavirus 2 NEGATIVE NEGATIVE Final    Comment:  (NOTE) SARS-CoV-2 target nucleic acids are NOT DETECTED. The SARS-CoV-2 RNA is generally detectable in upper and lower respiratory specimens during the acute phase of infection. Negative results do not preclude SARS-CoV-2 infection, do not rule out co-infections with other pathogens, and should not be used as the sole basis for treatment or other patient management decisions. Negative results must be combined with clinical observations, patient history, and epidemiological information. The expected result is Negative. Fact Sheet for Patients: SugarRoll.be Fact Sheet for Healthcare Providers: https://www.woods-mathews.com/ This test is not yet approved or cleared by the [place] FDA and  has been authorized for detection and/or diagnosis of SARS-CoV-2 by FDA under an Emergency Use Authorization (EUA). This EUA will remain  in effect (meaning this test can be used) for the duration of the COVID-19 declaration under Section  56 4(b)(1) of the Act, 21 U.S.C. section 360bbb-3(b)(1), unless the authorization is terminated or revoked sooner. Performed at [facility], [place] [address], [place], [place] [address]   CULTURE, BLOOD (ROUTINE X 2) w Reflex to ID Panel     Status: None (Preliminary result)   Collection Time: 01/31/19  4:22 PM   Specimen: BLOOD  Result Value Ref Range Status   Specimen Description BLOOD BLOOD LEFT HAND  Final   Special Requests   Final    BOTTLES DRAWN AEROBIC AND ANAEROBIC Blood Culture adequate volume   Culture   Final    NO GROWTH 3 DAYS Performed at [facility], [address] [place], [place] [address]    Report Status PENDING  Incomplete         Radiology Studies: No results found.      Scheduled Meds: . atorvastatin  40 mg Oral Daily  . calcitRIOL  0.25 mcg Oral Daily  . calcium carbonate  1,000 mg Oral BID  . cholecalciferol  1,000 Units Oral Daily  . citalopram  20 mg Oral  Daily  . donepezil  5 mg Oral QHS  . feeding supplement (ENSURE ENLIVE)  237 mL Oral BID BM  . folic acid  1 mg Oral BID  . heparin  5,000 Units Subcutaneous Q8H  . metoprolol tartrate  25 mg Oral BID  . mycophenolate  180 mg Oral BID  . sirolimus  3 mg Oral Daily  . sodium bicarbonate  650 mg Oral BID  . traZODone  50 mg Oral QHS   Continuous Infusions: . sodium chloride 75 mL/hr at 02/04/19 0524     LOS: 3 days    Time spent: 30 mins    Wyvonnia Dusky, MD [facility] Pager [phone]  If 7PM-7AM, please contact night-coverage [email or web address] Password [facility] 02/04/2019, 7:13 AM

## 2019-02-04 NOTE — NC FL2 (Signed)
[place] LEVEL OF CARE SCREENING TOOL     IDENTIFICATION  Patient Name: [name] Birthdate: 1950/09/13 Sex: female Admission Date (Current Location): 01/31/2019  [place] and [place] Number:  Engineering geologist and Address:  [facility], [address] [place], [place] [address]      Provider Number:    Attending Physician Name and Address:  Wyvonnia Dusky, MD  Relative Name and Phone Number:  [name] Daughter [phone]    Current Level of Care: Hospital Recommended Level of Care:   Prior Approval Number:    Date Approved/Denied:   PASRR Number: Pending  Discharge Plan: SNF    Current Diagnoses: Patient Active Problem List   Diagnosis Date Noted  . AKI (acute kidney injury) ([place]) 02/01/2019  . Shaking 01/31/2019  . Hyperkalemia 01/31/2019  . Acute renal failure superimposed on stage 4 chronic kidney disease ([place]) 01/31/2019  . HLD (hyperlipidemia) 01/31/2019  . Acute lower UTI 11/07/2018  . C. difficile colitis 11/07/2018  . Pressure injury of skin 09/23/2018  . Acute encephalopathy 09/22/2018  . Renal failure (ARF), acute on chronic ([place]) 08/12/2018  . Confusion 03/23/2018  . Hypokalemia 06/01/2017  . Sepsis ([place]) 06/29/2016  . UTI (urinary tract infection) 06/29/2016  . GERD (gastroesophageal reflux disease) 06/29/2016  . Dementia ([place]) 06/29/2016  . 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 Essential hypertension 07/06/2014  . H/O kidney transplant 07/06/2014    Orientation RESPIRATION BLADDER Height & Weight     Self  Normal External catheter Weight: 89.4 kg Height:  4\' 11"  (149.9 cm)  BEHAVIORAL SYMPTOMS/MOOD NEUROLOGICAL BOWEL NUTRITION STATUS    Convulsions/Seizures Incontinent    AMBULATORY STATUS COMMUNICATION OF NEEDS Skin     Verbally                         Personal Care Assistance Level of Assistance  Dressing, Bathing Bathing Assistance: Limited assistance   Dressing  Assistance: Limited assistance     Functional Limitations Info             SPECIAL CARE FACTORS FREQUENCY  PT (By licensed PT)     PT Frequency: 5 time sper week              Contractures Contractures Info: Not present    Additional Factors Info  Psychotropic, Allergies   Allergies Info: Latex, ACE inhibitors Psychotropic Info: Aricept, celexa, Trazadone         Current Medications (02/04/2019):  This is the current hospital active medication list Current Facility-Administered Medications  Medication Dose Route Frequency Provider Last Rate Last Admin  . 0.9 %  sodium chloride infusion   Intravenous Continuous Ivor Costa, MD 75 mL/hr at 02/04/19 0524 New Bag at 02/04/19 0524  . acetaminophen (TYLENOL) tablet 650 mg  650 mg Oral Q6H PRN Ivor Costa, MD      . atorvastatin (LIPITOR) tablet 40 mg  40 mg Oral Daily Ivor Costa, MD   40 mg at 02/04/19 1007  . calcitRIOL (ROCALTROL) capsule 0.25 mcg  0.25 mcg Oral Daily Ivor Costa, MD   0.25 mcg at 02/04/19 1007  . calcium carbonate (TUMS - dosed in mg elemental calcium) chewable tablet 1,000 mg  1,000 mg Oral BID Ivor Costa, MD   1,000 mg at 02/04/19 1008  . cholecalciferol (VITAMIN D) tablet 1,000 Units  1,000 Units Oral Daily Ivor Costa, MD   1,000 Units at 02/04/19 1007  . citalopram (CELEXA) tablet 20  mg  20 mg Oral Daily Ivor Costa, MD   20 mg at 02/04/19 1007  . donepezil (ARICEPT) tablet 5 mg  5 mg Oral QHS Ivor Costa, MD   5 mg at 02/03/19 2133  . feeding supplement (ENSURE ENLIVE) (ENSURE ENLIVE) liquid 237 mL  237 mL Oral BID BM Ivor Costa, MD   237 mL at 02/04/19 1008  . folic acid (FOLVITE) tablet 1 mg  1 mg Oral BID Ivor Costa, MD   1 mg at 02/04/19 1007  . heparin injection 5,000 Units  5,000 Units Subcutaneous Cleophas [place], MD   5,000 Units at 02/04/19 3474  . hydrALAZINE (APRESOLINE) tablet 25 mg  25 mg Oral TID PRN Ivor Costa, MD      . LORazepam (ATIVAN) injection 1 mg  1 mg Intravenous Q4H PRN Ivor Costa, MD       . metoprolol tartrate (LOPRESSOR) tablet 25 mg  25 mg Oral BID Ivor Costa, MD   25 mg at 02/04/19 1007  . mycophenolate (MYFORTIC) EC tablet 180 mg  180 mg Oral BID Ivor Costa, MD   180 mg at 02/04/19 1007  . ondansetron (ZOFRAN) injection 4 mg  4 mg Intravenous Q8H PRN Ivor Costa, MD      . sirolimus (RAPAMUNE) tablet 3 mg  3 mg Oral Daily Ivor Costa, MD   3 mg at 02/04/19 1007  . sodium bicarbonate tablet 650 mg  650 mg Oral BID Wyvonnia Dusky, MD   650 mg at 02/04/19 1007  . traZODone (DESYREL) tablet 50 mg  50 mg Oral QHS Ivor Costa, MD   50 mg at 02/03/19 2132     Discharge Medications: Please see discharge summary for a list of discharge medications.  Relevant Imaging Results:  Relevant Lab Results:   Additional Information SS# [id number]  Su Hilt, RN

## 2019-02-04 NOTE — Progress Notes (Signed)
[facility]  ROUNDING NOTE   Subjective:   Patient pleasant with dementia.   Objective:  Vital signs in last 24 hours:  Temp:  [98 F (36.7 C)-99.3 F (37.4 C)] 98 F (36.7 C) (12/21 0735) Pulse Rate:  [52-84] 70 (12/21 0735) Resp:  [16-20] 16 (12/21 0735) BP: (97-147)/(62-77) 147/77 (12/21 0735) SpO2:  [100 %] 100 % (12/21 0735)  Weight change:  Filed Weights   02/01/19 0606  Weight: 89.4 kg    Intake/Output: I/O last 3 completed shifts: In: 4100.1 [I.V.:4100.1] Out: 800 [Urine:800]   Intake/Output this shift:  Total I/O In: 858.8 [I.V.:858.8] Out: 500 [Urine:500]  Physical Exam: General: NAD, Laying in bed  Head: Normocephalic, atraumatic. Moist oral mucosal membranes  Eyes: Anicteric, PERRL  Neck: Supple, trachea midline  Lungs:  Clear to auscultation  Heart: Regular rate and rhythm  Abdomen:  Soft, nontender,   Extremities:  no peripheral edema.  Neurologic: Nonfocal, moving all four extremities  Skin: No lesions        Basic Metabolic Panel: Recent Labs  Lab 01/31/19 1037 02/01/19 0832 02/02/19 0347 02/03/19 0349 02/04/19 0355  NA 142 140 141 140 143  K 5.2* 4.4 4.4 4.0 4.0  CL 117* 116* 115* 115* 116*  CO2 17* 17* 18* 19* 20*  GLUCOSE 97 97 125* 104* 90  BUN 66* 61* 56* 40* 34*  CREATININE 3.10* 2.49* 2.13* 1.91* 1.82*  CALCIUM 9.4 8.7* 9.2 8.6* 8.8*    Liver Function Tests: Recent Labs  Lab 01/31/19 1037  AST 13*  ALT 10  ALKPHOS 43  BILITOT 0.4  PROT 7.4  ALBUMIN 3.6   No results for input(s): LIPASE, AMYLASE in the last 168 hours. No results for input(s): AMMONIA in the last 168 hours.  CBC: Recent Labs  Lab 01/31/19 1037 02/01/19 0832 02/02/19 0347 02/03/19 0349 02/04/19 0355  WBC 5.1 3.8* 5.9 4.4 4.8  HGB 10.3* 9.0* 9.8* 8.9* 9.3*  HCT 34.3* 28.6* 31.2* 29.4* 31.3*  MCV 83.1 77.3* 79.0* 80.5 81.7  PLT 167 155 164 151 157    Cardiac Enzymes: No results for input(s): CKTOTAL, CKMB, CKMBINDEX,  TROPONINI in the last 168 hours.  BNP: Invalid input(s): POCBNP  CBG: No results for input(s): GLUCAP in the last 168 hours.  Microbiology: Results for orders placed or performed during the hospital encounter of 01/31/19  Today - UCx     Status: Abnormal   Collection Time: 01/31/19 10:37 AM   Specimen: Urine, Random  Result Value Ref Range Status   Specimen Description   Final    URINE, RANDOM Performed at [facility], [address], [place], [place] [address]    Special Requests   Final    NONE Performed at [facility], [address] [place], [place] [address]    Culture MULTIPLE SPECIES PRESENT, SUGGEST RECOLLECTION (A)  Final   Report Status 02/01/2019 FINAL  Final  CULTURE, BLOOD (ROUTINE X 2) w Reflex to ID Panel     Status: None (Preliminary result)   Collection Time: 01/31/19  3:35 PM   Specimen: BLOOD  Result Value Ref Range Status   Specimen Description BLOOD RIGHT ANTECUBITAL  Final   Special Requests   Final    BOTTLES DRAWN AEROBIC AND ANAEROBIC Blood Culture adequate volume   Culture   Final    NO GROWTH 4 DAYS Performed at [facility], [address], [place], [place] [address]    Report Status PENDING  Incomplete  SARS  CORONAVIRUS 2 (TAT 6-24 HRS) Nasopharyngeal Nasopharyngeal Swab     Status: None   Collection Time: 01/31/19  4:21 PM   Specimen: Nasopharyngeal Swab  Result Value Ref Range Status   SARS Coronavirus 2 NEGATIVE NEGATIVE Final    Comment: (NOTE) SARS-CoV-2 target nucleic acids are NOT DETECTED. The SARS-CoV-2 RNA is generally detectable in upper and lower respiratory specimens during the acute phase of infection. Negative results do not preclude SARS-CoV-2 infection, do not rule out co-infections with other pathogens, and should not be used as the sole basis for treatment or other patient management decisions. Negative results must be combined with clinical observations, patient history, and  epidemiological information. The expected result is Negative. Fact Sheet for Patients: SugarRoll.be Fact Sheet for Healthcare Providers: https://www.woods-mathews.com/ This test is not yet approved or cleared by the [place] FDA and  has been authorized for detection and/or diagnosis of SARS-CoV-2 by FDA under an Emergency Use Authorization (EUA). This EUA will remain  in effect (meaning this test can be used) for the duration of the COVID-19 declaration under Section 56 4(b)(1) of the Act, 21 U.S.C. section 360bbb-3(b)(1), unless the authorization is terminated or revoked sooner. Performed at [facility], [place] [address], [place], [place] [address]   CULTURE, BLOOD (ROUTINE X 2) w Reflex to ID Panel     Status: None (Preliminary result)   Collection Time: 01/31/19  4:22 PM   Specimen: BLOOD  Result Value Ref Range Status   Specimen Description BLOOD BLOOD LEFT HAND  Final   Special Requests   Final    BOTTLES DRAWN AEROBIC AND ANAEROBIC Blood Culture adequate volume   Culture   Final    NO GROWTH 4 DAYS Performed at [facility], [address] [place],  [address]    Report Status PENDING  Incomplete    Coagulation Studies: No results for input(s): LABPROT, INR in the last 72 hours.  Urinalysis: No results for input(s): COLORURINE, LABSPEC, PHURINE, GLUCOSEU, HGBUR, BILIRUBINUR, KETONESUR, PROTEINUR, UROBILINOGEN, NITRITE, LEUKOCYTESUR in the last 72 hours.  Invalid input(s): APPERANCEUR    Imaging: No results found.   Medications:   . sodium chloride 75 mL/hr at 02/04/19 1333   . atorvastatin  40 mg Oral Daily  . calcitRIOL  0.25 mcg Oral Daily  . calcium carbonate  1,000 mg Oral BID  . cholecalciferol  1,000 Units Oral Daily  . citalopram  20 mg Oral Daily  . donepezil  5 mg Oral QHS  . feeding supplement (ENSURE ENLIVE)  237 mL Oral BID BM  . folic acid  1 mg Oral BID  . heparin  5,000  Units Subcutaneous Q8H  . metoprolol tartrate  25 mg Oral BID  . mycophenolate  180 mg Oral BID  . sirolimus  3 mg Oral Daily  . sodium bicarbonate  650 mg Oral BID  . traZODone  50 mg Oral QHS   acetaminophen, hydrALAZINE, LORazepam, ondansetron (ZOFRAN) IV  Assessment/ Plan:  [name] is a 68 y.o. black female with renal transplant 2009 on sirolimus, mycophenolate, prednisone, hypertension, history of renal cell carcinoma status post left nephrectomy.   1. Acute renal failure on chronic kidney disease stage IV - T Baseline creatinine 1.6-2 Continue sirolimus, mycophenolate  Acute renal failure from GI losses. Improved with IV fluids and PO intake.   2. Hypertension:  - metoprolol  3. Metabolic acidosis - sodium bicarbonate.   4. Anemia with chronic kidney disease: hemoglobin 9.3. Normocytic - Consider  ESA  5. Secondary Hyperparathyroidism - calcitriol    LOS: 3 Darleene Cumpian 12/21/20202:28 PM

## 2019-02-04 NOTE — Evaluation (Signed)
Clinical/Bedside Swallow Evaluation Patient Details  Name: [name] MRN: [id number] Date of Birth: 01/17/1951  Today's Date: 02/04/2019 Time: SLP Start Time (ACUTE ONLY): 1035 SLP Stop Time (ACUTE ONLY): 1100 SLP Time Calculation (min) (ACUTE ONLY): 25 min  Past Medical History:  Past Medical History:  Diagnosis Date  . Anemia   . C. difficile colitis   . Cancer of kidney ([place])   . Dementia ([place])   . GERD (gastroesophageal reflux disease)   . Hypertension   . Renal disorder   . Uterine cancer [facility])    Past Surgical History:  Past Surgical History:  Procedure Laterality Date  . ABDOMINAL HYSTERECTOMY    . AV FISTULA PLACEMENT    . BREAST BIOPSY     Patient is unsure but she thinks she remember a Bx  . BREAST BIOPSY Right 2017   benign  . KIDNEY TRANSPLANT    . NEPHRECTOMY TRANSPLANTED ORGAN     HPI:  Per admitting H&P: [name] is a 68 y.o. female with medical history significant of kidney transplantation on immunosuppressants, hypertension, GERD, depression, uterine cancer, kidney cancer, dementia, anemia, C. difficile colitis, who presents with shaking.   Assessment / Plan / Recommendation Clinical Impression  Patient appears to present with largely  functional swallowing abilities at bedside. No overt s/s aspiration observed with any consistency tested (thin liquid, puree, solid). Pt unable to follow directions consistently for oral mech exam, however observed functional and timely oral transit. No oral residue observed. Pt has no upper teeth, has upper dentures however not currently available. Pt demonstrated adequate mastication of solids without them. No overt s/s pharyngeal dysphagia observed. Swallow initiation appeared timely. Vocal quality remained clear thoughout evaluation. Due to cognitive deficits and lack of awareness, pt is at mild risk for aspiration w/PO's. Rec. Supervision with all PO intake to ensure standard safe swallow precautions and to  provide encouragement to eat and focus attention to task. Pt is easily distractible. Pt stated multiple times she had finished swallowing when she had clearly not. Pt and nursing deny any hx of dysphagia and deny any s/s aspiration with current regular diet. Educated pt  re: aspiration precautions and general safe swallow recommendations. Pt stated agreement. Recommend continue with current regular diet with thin liquids, may continue to give meds whole with puree for safer swallowing due to cognitive impairment. Discussed results of evaluation with nursing, nursing in agreement. SLP to sign off at this time, no tx indicated. Please re-consult with any future change in status requiring reassessment.  SLP Visit Diagnosis: Dysphagia, unspecified (R13.10)    Aspiration Risk  Mild aspiration risk    Diet Recommendation Regular;Thin liquid   Liquid Administration via: Cup;Straw Medication Administration: Whole meds with puree Supervision: Staff to assist with self feeding(Pt easily distractible, needs assist to focus on eating) Compensations: Minimize environmental distractions;Slow rate;Small sips/bites Postural Changes: Seated upright at 90 degrees;Remain upright for at least 30 minutes after po intake    Other  Recommendations Oral Care Recommendations: Oral care QID   Follow up Recommendations None      Frequency and Duration Other (Comment)(no tx indicated)          Prognosis Prognosis for Safe Diet Advancement: Good      Swallow Study   General Date of Onset: 02/04/19 HPI: Per admitting H&P: [name] is a 68 y.o. female with medical history significant of kidney transplantation on immunosuppressants, hypertension, GERD, depression, uterine cancer, kidney cancer, dementia, anemia, C. difficile colitis, who presents with  shaking. Type of Study: Bedside Swallow Evaluation Diet Prior to this Study: Regular;Thin liquids Respiratory Status: Room air History of Recent Intubation:  No Behavior/Cognition: Alert;Cooperative;Pleasant mood;Confused;Distractible;Requires cueing Oral Cavity Assessment: Within Functional Limits Oral Cavity - Dentition: Dentures, top;Dentures, not available Vision: Functional for self-feeding Self-Feeding Abilities: Needs assist;Needs set up Patient Positioning: Upright in bed Baseline Vocal Quality: Normal Volitional Cough: Weak Volitional Swallow: Unable to elicit    Oral/Motor/Sensory Function Overall Oral Motor/Sensory Function: Within functional limits   Ice Chips Ice chips: Within functional limits Presentation: Spoon   Thin Liquid Thin Liquid: Within functional limits Presentation: Cup;Self Fed;Straw    Nectar Thick Nectar Thick Liquid: Not tested   Honey Thick Honey Thick Liquid: Not tested   Puree Puree: Within functional limits Presentation: Spoon   Solid     Solid: Within functional limits Presentation: [place], MA, CCC-SLP 02/04/2019,11:04 AM

## 2019-02-04 NOTE — Progress Notes (Signed)
Patient with very poor PO intake this shift, refused breakfast and lunch.  Denies pain, VSS.  Alert to self only.  Patient spoke with daughter on the phone.  Repeat Covid swab done and walked to lab.  Clarise Cruz, BSN

## 2019-02-04 NOTE — TOC Progression Note (Signed)
Transition of Care [facility]) - Progression Note    Patient Details  Name: [name] MRN: [id number] Date of Birth: 10-Mar-1950  Transition of Care [facility]) CM/SW Contact  Su Hilt, RN Phone Number: 02/04/2019, 2:43 PM  Clinical Narrative:     Spoke with the patient's daughter [name] and reviewed the bed offers, she wants the [facility] to go [facility], I notified [name] of the choice       Expected Discharge Plan and Services                                                 Social Determinants of Health (SDOH) Interventions    Readmission Risk Interventions Readmission Risk Prevention Plan 12/02/2018 12/01/2018 08/15/2018  Transportation Screening Complete Complete Complete  PCP or Specialist Appt within 3-5 Days - - Complete  HRI or Home Care Consult - - Complete  Palliative Care Screening - - Not Applicable  Medication Review ([place]) Complete - Complete  PCP or Specialist appointment within 3-5 days of discharge Complete - -  [place] or Home Care Consult Complete Complete -  SW Recovery Care/Counseling Consult Patient refused - -  Palliative Care Screening Not Applicable - -  [place] Not Applicable Patient Refused -  Some recent data might be hidden

## 2019-02-05 ENCOUNTER — Encounter: Payer: Self-pay | Admitting: Internal Medicine

## 2019-02-05 LAB — CBC
HCT: 29.2 % — ABNORMAL LOW (ref 36.0–46.0)
Hemoglobin: 9.5 g/dL — ABNORMAL LOW (ref 12.0–15.0)
MCH: 25.1 pg — ABNORMAL LOW (ref 26.0–34.0)
MCHC: 32.5 g/dL (ref 30.0–36.0)
MCV: 77 fL — ABNORMAL LOW (ref 80.0–100.0)
Platelets: 159 10*3/uL (ref 150–400)
RBC: 3.79 MIL/uL — ABNORMAL LOW (ref 3.87–5.11)
RDW: 14.9 % (ref 11.5–15.5)
WBC: 4.5 10*3/uL (ref 4.0–10.5)
nRBC: 0 % (ref 0.0–0.2)

## 2019-02-05 LAB — BASIC METABOLIC PANEL
Anion gap: 9 (ref 5–15)
BUN: 24 mg/dL — ABNORMAL HIGH (ref 8–23)
CO2: 20 mmol/L — ABNORMAL LOW (ref 22–32)
Calcium: 8.8 mg/dL — ABNORMAL LOW (ref 8.9–10.3)
Chloride: 112 mmol/L — ABNORMAL HIGH (ref 98–111)
Creatinine, Ser: 1.49 mg/dL — ABNORMAL HIGH (ref 0.44–1.00)
GFR calc Af Amer: 41 mL/min — ABNORMAL LOW (ref >60)
GFR calc non Af Amer: 36 mL/min — ABNORMAL LOW (ref >60)
Glucose, Bld: 95 mg/dL (ref 70–99)
Potassium: 3.9 mmol/L (ref 3.5–5.1)
Sodium: 141 mmol/L (ref 135–145)

## 2019-02-05 LAB — CULTURE, BLOOD (ROUTINE X 2)
Culture: NO GROWTH
Culture: NO GROWTH
Special Requests: ADEQUATE
Special Requests: ADEQUATE

## 2019-02-05 MED ORDER — ACETAMINOPHEN 325 MG PO TABS: 650.0000 mg | ORAL_TABLET | Freq: Four times a day (QID) | ORAL | 0 refills | Status: AC | PRN

## 2019-02-05 NOTE — Evaluation (Signed)
Occupational Therapy Evaluation Patient Details Name: [name] MRN: [id number] DOB: Jun 30, 1950 Today's Date: 02/05/2019    History of Present Illness Pt is a 68 year old female admitted with AKI, ESRD, Htn and hyperkalemia; also + for VRE.  PMH includes dementia and CA of kidney and uterus..   Clinical Impression   Pt seen for OT evaluation this date. Per chart, pt was ambulating with SPC, unclear of ADL assist needed. No family present. Pt is poor historian and unable to verbalize PLOF. Per chart, pt lives with dtr. Pt initially responds to therapist's entrance into room and able to reciprocate common greetings. Significant difficulty with responding to simple questions or following commands despite cues. Pt demo's difficulty with initiating movement and motor planning. Pt currently demonstrates impairments in cognition, strength, activity tolerance, and balance requiring Max+ assist for bathing, dressing, toileting, and grooming from bed level. Per RN, pt has not been eating the last several meals despite encouragement. Pt would benefit from skilled OT to address noted impairments and functional limitations (see below for any additional details) in order to maximize safety and independence while minimizing falls risk and caregiver burden.  Upon hospital discharge, recommend pt discharge to SNF.    Follow Up Recommendations  SNF    Equipment Recommendations  3 in 1 bedside commode    Recommendations for Other Services       Precautions / Restrictions Precautions Precautions: Fall Restrictions Weight Bearing Restrictions: No      Mobility Bed Mobility Overal bed mobility: Needs Assistance Bed Mobility: Supine to Sit;Sit to Supine     Supine to sit: Max assist Sit to supine: Max assist   General bed mobility comments: Pt unable to initiate movement towards EOB. Pt very stiff and once B LEs off bed, needs max assist for upper body. ONce seated, keeps B LEs in extension.  Able to flex B knees and reach floor with cues  Transfers                 General transfer comment: Once seated at EOB, pt became very dizzy and leans over to prop on B LEs. Able to sit for a few minutes, however symptoms didnt go away. Returned to bed and OOB mobility unable to be performed    Balance Overall balance assessment: Needs assistance Sitting-balance support: Feet unsupported;Bilateral upper extremity supported Sitting balance-Leahy Scale: Fair Sitting balance - Comments: Ant lean on B knees due to dizziness                                   ADL either performed or assessed with clinical judgement   ADL Overall ADL's : Needs assistance/impaired   Eating/Feeding Details (indicate cue type and reason): pt refusing to eat past few meals Grooming: Bed level;Set up;Wash/dry face;Maximal assistance;Cueing for sequencing Grooming Details (indicate cue type and reason): pt unable to initiate with verbal/visual/tactile cues, requires Max A to complete                               General ADL Comments: Max A to total A for bathing, dressing, and bed level toileting     Vision Baseline Vision/History: (pt unable to state) Patient Visual Report: (pt denies issues, but difficult to assess 2/2 cognition) Additional Comments: difficult to formally assess 2/2 impaired cognition     Perception     Praxis  Pertinent Vitals/Pain Pain Assessment: Faces Faces Pain Scale: No hurt Pain Location: keeps knees in extended position once seated at EOB and reaches for them as if in pain. Unable to describe or comment further Pain Descriptors / Indicators: Grimacing;Guarding Pain Intervention(s): Limited activity within patient's tolerance     Hand Dominance Right   Extremity/Trunk Assessment Upper Extremity Assessment Upper Extremity Assessment: Generalized weakness;Difficult to assess due to impaired cognition   Lower Extremity Assessment Lower  Extremity Assessment: Difficult to assess due to impaired cognition;Overall [facility] for tasks assessed   Cervical / Trunk Assessment Cervical / Trunk Assessment: Kyphotic   Communication Communication Communication: No difficulties   Cognition Arousal/Alertness: Awake/alert Behavior During Therapy: WFL for tasks assessed/performed Overall Cognitive Status: History of cognitive impairments - at baseline Area of Impairment: Orientation;Following commands                               General Comments: alert w/ occasional cues, oriented to self, able to "chit chat" but significant difficulty with 1 step commands despite verbal and visual cues, hand over hand to initiate tasks   General Comments       Exercises Other Exercises Other Exercises: supine ther-ex performed with heavy cues for participation. Ther-ex included B LE AP, SLR, hip abd/add, and attempted heel slides. 10 reps with max assist   Shoulder Instructions      Home Living Family/patient expects to be discharged to:: Private residence Living Arrangements: Children(daughter) Available Help at Discharge: Family Type of Home: House Home Access: Other (comment)           Bathroom Shower/Tub: Tub/shower unit   Bathroom Toilet: Standard     Home Equipment: Environmental consultant - 2 wheels;Cane - single point   Additional Comments: Pt unable to provide hx; per chart, pt lives with her daughter.  Pt stated that she walks around the home without an AD; previous documentation suggests that she uses a [facility] for home ambulation.      Prior Functioning/Environment Level of Independence: (unclear PLOF for ADL)                 OT Problem List: Decreased strength;Decreased cognition;Decreased activity tolerance;Impaired balance (sitting and/or standing);Decreased knowledge of use of DME or AE;Decreased safety awareness      OT Treatment/Interventions: Self-care/ADL training;Therapeutic exercise;Therapeutic  activities;Cognitive remediation/compensation;Patient/family education;DME and/or AE instruction;Balance training    OT Goals(Current goals can be found in the care plan section) Acute Rehab OT Goals Patient Stated Goal: pt unable to verbalize OT Goal Formulation: Patient unable to participate in goal setting Time For Goal Achievement: 02/19/19 Potential to Achieve Goals: Fair ADL Goals Pt Will Perform Grooming: bed level;with set-up;with min assist(Min A to initiate and cues to complete) Additional ADL Goal #1: Pt will perform bed mobility requiring Mod-Max assist for bed level ADL.  OT Frequency: Min 1X/week   Barriers to D/C:            Co-evaluation              AM-PAC OT "6 Clicks" Daily Activity     Outcome Measure Help from another person eating meals?: A Little Help from another person taking care of personal grooming?: A Lot Help from another person toileting, which includes using toliet, bedpan, or urinal?: Total Help from another person bathing (including washing, rinsing, drying)?: A Lot Help from another person to put on and taking off regular upper body clothing?: A Lot Help from  another person to put on and taking off regular lower body clothing?: A Lot 6 Click Score: 12   End of Session    Activity Tolerance: Patient tolerated treatment well Patient left: in bed;with call bell/phone within reach;with bed alarm set  OT Visit Diagnosis: Other abnormalities of gait and mobility (R26.89);Muscle weakness (generalized) (M62.81);Other symptoms and signs involving cognitive function                Time: 9476-5465 OT Time Calculation (min): 12 min Charges:  OT General Charges $OT Visit: 1 Visit OT Evaluation $OT Eval Moderate Complexity: 1 Mod  Jeni Salles, MPH, MS, OTR/L ascom [phone] 02/05/19, 11:39 AM

## 2019-02-05 NOTE — Progress Notes (Signed)
[facility]  ROUNDING NOTE   Subjective:   Patient pleasant with dementia.   UOP 2 liters.   Creatinine 1.49 (1.82)  Objective:  Vital signs in last 24 hours:  Temp:  [98 F (36.7 C)-98.4 F (36.9 C)] 98.4 F (36.9 C) (12/22 0821) Pulse Rate:  [63-80] 78 (12/22 0821) Resp:  [17-18] 18 (12/22 0821) BP: (155-170)/(60-93) 163/74 (12/22 0821) SpO2:  [95 %-100 %] 98 % (12/22 0821)  Weight change:  Filed Weights   02/01/19 0606  Weight: 89.4 kg    Intake/Output: I/O last 3 completed shifts: In: 3598.4 [I.V.:3598.4] Out: 2050 [Urine:2050]   Intake/Output this shift:  No intake/output data recorded.  Physical Exam: General: NAD, Laying in bed  Head: Normocephalic, atraumatic. Moist oral mucosal membranes  Eyes: Anicteric, PERRL  Neck: Supple, trachea midline  Lungs:  Clear to auscultation  Heart: Regular rate and rhythm  Abdomen:  Soft, nontender,   Extremities:  no peripheral edema.  Neurologic: Nonfocal, moving all four extremities  Skin: No lesions        Basic Metabolic Panel: Recent Labs  Lab 02/01/19 0832 02/02/19 0347 02/03/19 0349 02/04/19 0355 02/05/19 0432  NA 140 141 140 143 141  K 4.4 4.4 4.0 4.0 3.9  CL 116* 115* 115* 116* 112*  CO2 17* 18* 19* 20* 20*  GLUCOSE 97 125* 104* 90 95  BUN 61* 56* 40* 34* 24*  CREATININE 2.49* 2.13* 1.91* 1.82* 1.49*  CALCIUM 8.7* 9.2 8.6* 8.8* 8.8*    Liver Function Tests: Recent Labs  Lab 01/31/19 1037  AST 13*  ALT 10  ALKPHOS 43  BILITOT 0.4  PROT 7.4  ALBUMIN 3.6   No results for input(s): LIPASE, AMYLASE in the last 168 hours. No results for input(s): AMMONIA in the last 168 hours.  CBC: Recent Labs  Lab 02/01/19 0832 02/02/19 0347 02/03/19 0349 02/04/19 0355 02/05/19 0432  WBC 3.8* 5.9 4.4 4.8 4.5  HGB 9.0* 9.8* 8.9* 9.3* 9.5*  HCT 28.6* 31.2* 29.4* 31.3* 29.2*  MCV 77.3* 79.0* 80.5 81.7 77.0*  PLT 155 164 151 157 159    Cardiac Enzymes: No results for input(s):  CKTOTAL, CKMB, CKMBINDEX, TROPONINI in the last 168 hours.  BNP: Invalid input(s): POCBNP  CBG: No results for input(s): GLUCAP in the last 168 hours.  Microbiology: Results for orders placed or performed during the hospital encounter of 01/31/19  Today - UCx     Status: Abnormal   Collection Time: 01/31/19 10:37 AM   Specimen: Urine, Random  Result Value Ref Range Status   Specimen Description   Final    URINE, RANDOM Performed at [facility], [address] [place], [place] [address]    Special Requests   Final    NONE Performed at [facility], [address] [place], [place] [address]    Culture MULTIPLE SPECIES PRESENT, SUGGEST RECOLLECTION (A)  Final   Report Status 02/01/2019 FINAL  Final  CULTURE, BLOOD (ROUTINE X 2) w Reflex to ID Panel     Status: None   Collection Time: 01/31/19  3:35 PM   Specimen: BLOOD  Result Value Ref Range Status   Specimen Description BLOOD RIGHT ANTECUBITAL  Final   Special Requests   Final    BOTTLES DRAWN AEROBIC AND ANAEROBIC Blood Culture adequate volume   Culture   Final    NO GROWTH 5 DAYS Performed at [facility], [address] [place], [place] [address]    Report Status 02/05/2019  FINAL  Final  SARS CORONAVIRUS 2 (TAT 6-24 HRS) Nasopharyngeal Nasopharyngeal Swab     Status: None   Collection Time: 01/31/19  4:21 PM   Specimen: Nasopharyngeal Swab  Result Value Ref Range Status   SARS Coronavirus 2 NEGATIVE NEGATIVE Final    Comment: (NOTE) SARS-CoV-2 target nucleic acids are NOT DETECTED. The SARS-CoV-2 RNA is generally detectable in upper and lower respiratory specimens during the acute phase of infection. Negative results do not preclude SARS-CoV-2 infection, do not rule out co-infections with other pathogens, and should not be used as the sole basis for treatment or other patient management decisions. Negative results must be combined with clinical observations, patient history, and  epidemiological information. The expected result is Negative. Fact Sheet for Patients: SugarRoll.be Fact Sheet for Healthcare Providers: https://www.woods-mathews.com/ This test is not yet approved or cleared by the [place] FDA and  has been authorized for detection and/or diagnosis of SARS-CoV-2 by FDA under an Emergency Use Authorization (EUA). This EUA will remain  in effect (meaning this test can be used) for the duration of the COVID-19 declaration under Section 56 4(b)(1) of the Act, 21 U.S.C. section 360bbb-3(b)(1), unless the authorization is terminated or revoked sooner. Performed at [facility], [place] [address] [place], [place] [address]   CULTURE, BLOOD (ROUTINE X 2) w Reflex to ID Panel     Status: None   Collection Time: 01/31/19  4:22 PM   Specimen: BLOOD  Result Value Ref Range Status   Specimen Description BLOOD BLOOD LEFT HAND  Final   Special Requests   Final    BOTTLES DRAWN AEROBIC AND ANAEROBIC Blood Culture adequate volume   Culture   Final    NO GROWTH 5 DAYS Performed at [facility], [address], [place], [place] [address]    Report Status 02/05/2019 FINAL  Final  SARS CORONAVIRUS 2 (TAT 6-24 HRS) Nasopharyngeal Nasopharyngeal Swab     Status: None   Collection Time: 02/04/19 12:05 PM   Specimen: Nasopharyngeal Swab  Result Value Ref Range Status   SARS Coronavirus 2 NEGATIVE NEGATIVE Final    Comment: (NOTE) SARS-CoV-2 target nucleic acids are NOT DETECTED. The SARS-CoV-2 RNA is generally detectable in upper and lower respiratory specimens during the acute phase of infection. Negative results do not preclude SARS-CoV-2 infection, do not rule out co-infections with other pathogens, and should not be used as the sole basis for treatment or other patient management decisions. Negative results must be combined with clinical observations, patient history, and epidemiological information.  The expected result is Negative. Fact Sheet for Patients: SugarRoll.be Fact Sheet for Healthcare Providers: https://www.woods-mathews.com/ This test is not yet approved or cleared by the [place] FDA and  has been authorized for detection and/or diagnosis of SARS-CoV-2 by FDA under an Emergency Use Authorization (EUA). This EUA will remain  in effect (meaning this test can be used) for the duration of the COVID-19 declaration under Section 56 4(b)(1) of the Act, 21 U.S.C. section 360bbb-3(b)(1), unless the authorization is terminated or revoked sooner. Performed at [facility], [place] [address] [place], [place] [address]     Coagulation Studies: No results for input(s): LABPROT, INR in the last 72 hours.  Urinalysis: No results for input(s): COLORURINE, LABSPEC, PHURINE, GLUCOSEU, HGBUR, BILIRUBINUR, KETONESUR, PROTEINUR, UROBILINOGEN, NITRITE, LEUKOCYTESUR in the last 72 hours.  Invalid input(s): APPERANCEUR    Imaging: No results found.   Medications:   . sodium chloride 75 mL/hr at 02/05/19 1129   .  atorvastatin  40 mg Oral Daily  . calcitRIOL  0.25 mcg Oral Daily  . calcium carbonate  1,000 mg Oral BID  . cholecalciferol  1,000 Units Oral Daily  . citalopram  20 mg Oral Daily  . donepezil  5 mg Oral QHS  . feeding supplement (ENSURE ENLIVE)  237 mL Oral BID BM  . folic acid  1 mg Oral BID  . heparin  5,000 Units Subcutaneous Q8H  . metoprolol tartrate  25 mg Oral BID  . mycophenolate  180 mg Oral BID  . sirolimus  3 mg Oral Daily  . sodium bicarbonate  650 mg Oral BID  . traZODone  50 mg Oral QHS   acetaminophen, hydrALAZINE, LORazepam, ondansetron (ZOFRAN) IV  Assessment/ Plan:  Ms. Lavanya Roa is a 68 y.o. black female with renal transplant 2009 on sirolimus, mycophenolate, prednisone, hypertension, history of renal cell carcinoma status post left nephrectomy.   1. Acute renal failure on chronic  kidney disease stage IV - T Baseline creatinine 1.6-2 Continue sirolimus, mycophenolate  Acute renal failure from GI losses. Improved with IV fluids and PO intake.   2. Hypertension:  - metoprolol  3. Metabolic acidosis - sodium bicarbonate.   4. Anemia with chronic kidney disease: hemoglobin 9.5. Normocytic - Consider ESA  5. Secondary Hyperparathyroidism - calcitriol   LOS: 4 [name] 12/22/202012:27 PM

## 2019-02-05 NOTE — TOC Progression Note (Cosign Needed)
Transition of Care [facility]) - Progression Note    Patient Details  Name: [name] MRN: [id number] Date of Birth: [birth year]  Transition of Care [facility]) CM/SW [place], RN Phone Number: 02/05/2019, 11:47 AM  Clinical Narrative:     Patient to go to [facility] to transport, Bedside nurse to contact [facility] for report and to call EMS for transport Family Made aware       Expected Discharge Plan and Services           Expected Discharge Date: 02/05/19                                     Social Determinants of Health (SDOH) Interventions    Readmission Risk Interventions Readmission Risk Prevention Plan 12/02/2018 12/01/2018 08/15/2018  Transportation Screening Complete Complete Complete  PCP or Specialist Appt within 3-5 Days - - Complete  HRI or Home Care Consult - - Complete  Palliative Care Screening - - Not Applicable  Medication Review ([place]) Complete - Complete  PCP or Specialist appointment within 3-5 days of discharge Complete - -  [place] or Home Care Consult Complete Complete -  SW Recovery Care/Counseling Consult Patient refused - -  Palliative Care Screening Not Applicable - -  [place] Not Applicable Patient Refused -  Some recent data might be hidden

## 2019-02-05 NOTE — Progress Notes (Signed)
Physical Therapy Treatment Patient Details Name: [name] MRN: [id number] DOB: 12-31-1950 Today's Date: 02/05/2019    History of Present Illness Pt is a 68 year old female admitted with AKI, ESRD, Htn and hyperkalemia; also + for VRE.  PMH includes dementia and CA of kidney and uterus..    PT Comments    Pt is making poor progress towards goals with cues for participation required and little carryover from previous session. Once seated, pt reports dizziness. She is unable to problem solve what would improve symptoms. Able to sit at EOB for several minutes prior to needing max assist to return to bed. Cold washcloth given to patient, cues needed to use correctly. Pt refusing to eat breakfast and keeps elbow bent, disrupting IV despite cues/education from RN/PT. Will progress as able.  Follow Up Recommendations  SNF     Equipment Recommendations       Recommendations for Other Services       Precautions / Restrictions Precautions Precautions: Fall Restrictions Weight Bearing Restrictions: No    Mobility  Bed Mobility Overal bed mobility: Needs Assistance Bed Mobility: Supine to Sit;Sit to Supine     Supine to sit: Max assist Sit to supine: Max assist   General bed mobility comments: Pt unable to initiate movement towards EOB. Pt very stiff and once B LEs off bed, needs max assist for upper body. ONce seated, keeps B LEs in extension. Able to flex B knees and reach floor with cues  Transfers                 General transfer comment: Once seated at EOB, pt became very dizzy and leans over to prop on B LEs. Able to sit for a few minutes, however symptoms didnt go away. Returned to bed and OOB mobility unable to be performed  Ambulation/Gait             General Gait Details: unable   Chief Strategy Officer    Modified Rankin (Stroke Patients Only)       Balance Overall balance assessment: Needs assistance Sitting-balance  support: Feet unsupported;Bilateral upper extremity supported Sitting balance-Leahy Scale: Fair Sitting balance - Comments: Ant lean on B knees due to dizziness                                    Cognition Arousal/Alertness: Awake/alert Behavior During Therapy: WFL for tasks assessed/performed Overall Cognitive Status: History of cognitive impairments - at baseline Area of Impairment: Orientation;Following commands                               General Comments: Pt aware to self, however has difficulty following commands      Exercises Other Exercises Other Exercises: supine ther-ex performed with heavy cues for participation. Ther-ex included B LE AP, SLR, hip abd/add, and attempted heel slides. 10 reps with max assist    General Comments        Pertinent Vitals/Pain Pain Assessment: Faces Faces Pain Scale: Hurts a little bit Pain Location: keeps knees in extended position once seated at EOB and reaches for them as if in pain. Unable to describe or comment further Pain Descriptors / Indicators: Grimacing;Guarding Pain Intervention(s): Limited activity within patient's tolerance    Home Living  Prior Function            PT Goals (current goals can now be found in the care plan section) Acute Rehab PT Goals Patient Stated Goal: none stated PT Goal Formulation: Patient unable to participate in goal setting Progress towards PT goals: Progressing toward goals    Frequency    Min 2X/week      PT Plan Current plan remains appropriate    Co-evaluation              AM-PAC PT "6 Clicks" Mobility   Outcome Measure  Help needed turning from your back to your side while in a flat bed without using bedrails?: A Lot Help needed moving from lying on your back to sitting on the side of a flat bed without using bedrails?: A Lot Help needed moving to and from a bed to a chair (including a wheelchair)?: Total Help  needed standing up from a chair using your arms (e.g., wheelchair or bedside chair)?: Total Help needed to walk in hospital room?: Total Help needed climbing 3-5 steps with a railing? : Total 6 Click Score: 8    End of Session   Activity Tolerance: Patient limited by fatigue Patient left: in bed;with call bell/phone within reach;with bed alarm set Nurse Communication: Mobility status PT Visit Diagnosis: Unsteadiness on feet (R26.81);Muscle weakness (generalized) (M62.81);Difficulty in walking, not elsewhere classified (R26.2)     Time: 5859-2924 PT Time Calculation (min) (ACUTE ONLY): 23 min  Charges:  $Therapeutic Exercise: 8-22 mins $Therapeutic Activity: 8-22 mins                     Greggory Stallion, PT, DPT [phone]    Derian Pfost 02/05/2019, 10:55 AM

## 2019-02-05 NOTE — Discharge Summary (Signed)
Physician Discharge Summary  [name]:[id number] DOB: 1950/03/18 DOA: 01/31/2019  PCP: Sheri Hire, MD  Admit date: 01/31/2019 Discharge date: 02/05/2019  Admitted From: home Disposition: SNF  Recommendations for Outpatient Follow-up:  1. Follow up with PCP in 1-2 weeks 2. F/U nephro in 1-2 weeks  Home Health: Equipment/Devices: n/a  Discharge Condition: stable CODE STATUS: full  Diet recommendation: Heart Healthy   Brief/Interim Summary: HPI taken from [name]: [name] is a 68 y.o. female with medical history significant of kidney transplantation on immunosuppressants, hypertension, GERD, depression, uterine cancer, kidney cancer, dementia, anemia, C. difficile colitis, who presents with shaking.  Per care giver, pt has been having intermittent body shaking in the past several months, which has worsened today. No unilateral numbness or tingling his extremities.  No facial droop or slurred speech.  Patient denies any chest pain, shortness breath, cough.  No nausea, vomiting, diarrhea, abdominal pain.  Patient has some burning on urination, denies dysuria or increased urinary frequency.  She has foul-smelling urine per caregiver.  ED Course: pt was found to have positive urinalysis for UTI, pending COVID-19 test, hyperkalemia with potassium 5.2, worsening renal function, temperature normal, blood pressure 148/74, heart rate 58, oxygen saturation 100% on room air.  Patient is placed on MedSurg bed for observation.  Renal, [name] was consulted.  Hospital Course from [name] 02/01/19-02/05/19: Pt was found to have AKI on CKD IV which improved w/ IVFs and holding lasix, NSAIDs. Pt's Cr was back to baseline prior to discharge. Furthermore, pt had generalized weakness so PT/OT were consulted and recommended SNF. On the day prior to discharge and day of discharge pt was refusing to eat. I called the pt's daughter who stated that she has never had this problem  in the past but recommends giving the pt ensure juice and not milk as it causes her to have diarrhea. Also, I talked about palliative care as dementia is a progressive disease and pts often stop eating or drinking. At this time, pt's daughter does not want palliative care.   Discharge Diagnoses:  Principal Problem:   Acute renal failure superimposed on stage 4 chronic kidney disease (HCC) Active Problems:   Essential hypertension   H/O kidney transplant   Dementia (HCC)   Shaking   Hyperkalemia   HLD (hyperlipidemia)   AKI (acute kidney injury) ([place])   Pressure injury of skin  AKI on CKD IV: Baseline Cre is1.9 recently. Cr is back at baseline as per nephro. Likely due to dehydration, diuretics, NSAIDs. Nephro following and recs apprec. Will continue to monitor   Metabolic acidosis: continue on bicarb. Improving  Positive UA:ID rec not starting abxs for positive UA and observing closely for signs of sepsis  HTN: continue on metoprolol. Hydralazine prn. Hold lasix secondary to AKI on CKD  Leukopenia: resolved  Hx kidney transplant:continue homemycophenolate and sirolimus  Likely ACD: likely secondary to CKD. No need for a transfusion currently. Will continue to monitor   Dementia: continue homedonepezil. Continue w/ supportive care   Shaking:etiology unclear. Possibly due to gabapentin use, hold off gabapentin per renal,[name]'srecommendation.  Hyperkalemia: resolved. Will continue to monitor   HLD: continue statin  Generalized weakness: PT recs SNF. Pt will discharge to SNF today;     Discharge Instructions  Discharge Instructions    Diet - low sodium heart healthy   Complete by: As directed    Renal diet   Discharge instructions   Complete by: As directed  F/U PCP in 1-2 weeks; F/U nephro in 1 week   Increase activity slowly   Complete by: As directed      Allergies as of 02/05/2019      Reactions   Ace Inhibitors Cough   Latex Swelling    Lisinopril Cough      Medication List    STOP taking these medications   gabapentin 300 MG capsule Commonly known as: NEURONTIN   meloxicam 15 MG tablet Commonly known as: MOBIC     TAKE these medications   acetaminophen 325 MG tablet Commonly known as: TYLENOL Take 2 tablets (650 mg total) by mouth every 6 (six) hours as needed for mild pain or fever.   atorvastatin 40 MG tablet Commonly known as: LIPITOR Take 40 mg by mouth daily.   calcitRIOL 0.25 MCG capsule Commonly known as: ROCALTROL Take 0.25 mcg by mouth daily.   cholecalciferol 25 MCG (1000 UT) tablet Commonly known as: VITAMIN D3 Take 1,000 Units by mouth daily.   citalopram 20 MG tablet Commonly known as: CELEXA Take 20 mg by mouth daily.   donepezil 5 MG tablet Commonly known as: ARICEPT Take 5 mg by mouth at bedtime.   feeding supplement (ENSURE ENLIVE) Liqd Take 237 mLs by mouth 2 (two) times daily between meals.   folic acid 1 MG tablet Commonly known as: FOLVITE Take 1 mg by mouth 2 (two) times daily.   furosemide 20 MG tablet Commonly known as: LASIX Take 1 tablet (20 mg total) by mouth daily. What changed: when to take this   metoprolol tartrate 25 MG tablet Commonly known as: LOPRESSOR Take 25 mg by mouth 2 (two) times daily.   mycophenolate 180 MG EC tablet Commonly known as: MYFORTIC Take 180 mg by mouth 2 (two) times daily.   potassium chloride 10 MEQ tablet Commonly known as: KLOR-CON Take 2 tablets (20 mEq total) by mouth daily. ONLY WHILE TAKING LASIX What changed:   when to take this  additional instructions   sirolimus 1 MG tablet Commonly known as: RAPAMUNE Take 3 mg by mouth daily.   traZODone 50 MG tablet Commonly known as: DESYREL Take 50 mg by mouth at bedtime.   Tums Ultra 1000 400 MG chewable tablet Generic drug: calcium elemental as carbonate Chew 1,000 mg by mouth 2 (two) times daily.      Contact information for after-discharge care    Destination     [facility]erred [facility] .   Service: Skilled Nursing Contact information: [place] [phone]             Allergies  Allergen Reactions  . Ace Inhibitors Cough  . Latex Swelling  . Lisinopril Cough    Consultations:  nephro   Procedures/Studies: US Renal Transplant w/Doppler  Result Date: 01/31/2019 CLINICAL DATA:  Acute kidney injury, renal transplant recipient in 2010 EXAM: ULTRASOUND OF RENAL TRANSPLANT WITH RENAL DOPPLER ULTRASOUND TECHNIQUE: Ultrasound examination of the renal transplant was performed with gray-scale, color and duplex doppler evaluation. COMPARISON:  CT renal colic 07/62/2633 FINDINGS: Transplant kidney location: RLQ Transplant Kidney: Renal measurements: 11.3 x 5.1 x 4.2 cm = volume: 125.36mL. Normal in size and parenchymal echogenicity. No evidence of mass. Mild calyceal dilatation resolved postvoiding, likely physiologic. No peri-transplant fluid collection seen. Color flow in the main renal artery:  Yes Color flow in the main renal vein:  Yes Duplex Doppler Evaluation: Main Renal Artery Resistive Index: 0.8 Venous waveform in main renal vein:  Present Intrarenal resistive index in upper pole:  0.7 (normal 0.6-0.8; equivocal 0.8-0.9; abnormal >= 0.9) Intrarenal resistive index in lower pole: 0.7 (normal 0.6-0.8; equivocal 0.8-0.9; abnormal >= 0.9) Bladder: Normal for degree of bladder distention. Other findings:  None. IMPRESSION: Resistive index in the main renal artery is borderline elevated at 0.81. This is a nonspecific finding in the absence of comparison Doppler evaluation. Such findings could be considered within the clinical context. Mild caliectasis in the transplant resolved with voiding. Likely physiologic. No other abnormality is seen. Electronically Signed   By: Lovena Le M.D.   On: 01/31/2019 17:43       Subjective: Pt c/o not having an appetite  Discharge Exam: Vitals:    02/05/19 0013 02/05/19 0821  BP: (!) 170/61 (!) 163/74  Pulse: 77 78  Resp: 17 18  Temp: 98 F (36.7 C) 98.4 F (36.9 C)  SpO2: 95% 98%   Vitals:   02/04/19 1614 02/04/19 2121 02/05/19 0013 02/05/19 0821  BP: (!) 155/93 (!) 157/60 (!) 170/61 (!) 163/74  Pulse: 63 80 77 78  Resp: 18 18 17 18   Temp: 98 F (36.7 C) 98.2 F (36.8 C) 98 F (36.7 C) 98.4 F (36.9 C)  TempSrc: Oral   Oral  SpO2: 100% 100% 95% 98%  Weight:      Height:        General: Pt is alert, awake, not in acute distress Cardiovascular:  S1/S2 +, no rubs, no gallops Respiratory: CTA bilaterally, no wheezing, no rales Abdominal: Soft, NT, ND, bowel sounds + Extremities: no edema, no cyanosis    The results of significant diagnostics from this hospitalization (including imaging, microbiology, ancillary and laboratory) are listed below for reference.     Microbiology: Recent Results (from the past 240 hour(s))  Today - UCx     Status: Abnormal   Collection Time: 01/31/19 10:37 AM   Specimen: Urine, Random  Result Value Ref Range Status   Specimen Description   Final    URINE, RANDOM Performed at [facility], [address] [place], [place] [address]    Special Requests   Final    NONE Performed at [facility], [address] [place], [place] [address]    Culture MULTIPLE SPECIES PRESENT, SUGGEST RECOLLECTION (A)  Final   Report Status 02/01/2019 FINAL  Final  CULTURE, BLOOD (ROUTINE X 2) w Reflex to ID Panel     Status: None   Collection Time: 01/31/19  3:35 PM   Specimen: BLOOD  Result Value Ref Range Status   Specimen Description BLOOD RIGHT ANTECUBITAL  Final   Special Requests   Final    BOTTLES DRAWN AEROBIC AND ANAEROBIC Blood Culture adequate volume   Culture   Final    NO GROWTH 5 DAYS Performed at [facility], [address] [place],  [address]    Report Status 02/05/2019 FINAL  Final  SARS CORONAVIRUS 2 (TAT 6-24 HRS) Nasopharyngeal  Nasopharyngeal Swab     Status: None   Collection Time: 01/31/19  4:21 PM   Specimen: Nasopharyngeal Swab  Result Value Ref Range Status   SARS Coronavirus 2 NEGATIVE NEGATIVE Final    Comment: (NOTE) SARS-CoV-2 target nucleic acids are NOT DETECTED. The SARS-CoV-2 RNA is generally detectable in upper and lower respiratory specimens during the acute phase of infection. Negative results do not preclude SARS-CoV-2 infection, do not rule out co-infections with other pathogens, and should not be used as the sole basis for treatment or  other patient management decisions. Negative results must be combined with clinical observations, patient history, and epidemiological information. The expected result is Negative. Fact Sheet for Patients: SugarRoll.be Fact Sheet for Healthcare Providers: https://www.woods-mathews.com/ This test is not yet approved or cleared by the [place] FDA and  has been authorized for detection and/or diagnosis of SARS-CoV-2 by FDA under an Emergency Use Authorization (EUA). This EUA will remain  in effect (meaning this test can be used) for the duration of the COVID-19 declaration under Section 56 4(b)(1) of the Act, 21 U.S.C. section 360bbb-3(b)(1), unless the authorization is terminated or revoked sooner. Performed at [facility], [place] [address] [place], [place] [address]   CULTURE, BLOOD (ROUTINE X 2) w Reflex to ID Panel     Status: None   Collection Time: 01/31/19  4:22 PM   Specimen: BLOOD  Result Value Ref Range Status   Specimen Description BLOOD BLOOD LEFT HAND  Final   Special Requests   Final    BOTTLES DRAWN AEROBIC AND ANAEROBIC Blood Culture adequate volume   Culture   Final    NO GROWTH 5 DAYS Performed at [facility], [address], [place], [place] [address]    Report Status 02/05/2019 FINAL  Final  SARS CORONAVIRUS 2 (TAT 6-24 HRS) Nasopharyngeal Nasopharyngeal Swab     Status:  None   Collection Time: 02/04/19 12:05 PM   Specimen: Nasopharyngeal Swab  Result Value Ref Range Status   SARS Coronavirus 2 NEGATIVE NEGATIVE Final    Comment: (NOTE) SARS-CoV-2 target nucleic acids are NOT DETECTED. The SARS-CoV-2 RNA is generally detectable in upper and lower respiratory specimens during the acute phase of infection. Negative results do not preclude SARS-CoV-2 infection, do not rule out co-infections with other pathogens, and should not be used as the sole basis for treatment or other patient management decisions. Negative results must be combined with clinical observations, patient history, and epidemiological information. The expected result is Negative. Fact Sheet for Patients: SugarRoll.be Fact Sheet for Healthcare Providers: https://www.woods-mathews.com/ This test is not yet approved or cleared by the [place] FDA and  has been authorized for detection and/or diagnosis of SARS-CoV-2 by FDA under an Emergency Use Authorization (EUA). This EUA will remain  in effect (meaning this test can be used) for the duration of the COVID-19 declaration under Section 56 4(b)(1) of the Act, 21 U.S.C. section 360bbb-3(b)(1), unless the authorization is terminated or revoked sooner. Performed at [facility], [place] [address] [place], [place] [address]      Labs: BNP (last 3 results) No results for input(s): BNP in the last 8760 hours. Basic Metabolic Panel: Recent Labs  Lab 02/01/19 0832 02/02/19 0347 02/03/19 0349 02/04/19 0355 02/05/19 0432  NA 140 141 140 143 141  K 4.4 4.4 4.0 4.0 3.9  CL 116* 115* 115* 116* 112*  CO2 17* 18* 19* 20* 20*  GLUCOSE 97 125* 104* 90 95  BUN 61* 56* 40* 34* 24*  CREATININE 2.49* 2.13* 1.91* 1.82* 1.49*  CALCIUM 8.7* 9.2 8.6* 8.8* 8.8*   Liver Function Tests: Recent Labs  Lab 01/31/19 1037  AST 13*  ALT 10  ALKPHOS 43  BILITOT 0.4  PROT 7.4  ALBUMIN 3.6   No  results for input(s): LIPASE, AMYLASE in the last 168 hours. No results for input(s): AMMONIA in the last 168 hours. CBC: Recent Labs  Lab 02/01/19 0832 02/02/19 0347 02/03/19 0349 02/04/19 0355 02/05/19 0432  WBC 3.8* 5.9 4.4 4.8  4.5  HGB 9.0* 9.8* 8.9* 9.3* 9.5*  HCT 28.6* 31.2* 29.4* 31.3* 29.2*  MCV 77.3* 79.0* 80.5 81.7 77.0*  PLT 155 164 151 157 159   Cardiac Enzymes: No results for input(s): CKTOTAL, CKMB, CKMBINDEX, TROPONINI in the last 168 hours. BNP: Invalid input(s): POCBNP CBG: No results for input(s): GLUCAP in the last 168 hours. D-Dimer No results for input(s): DDIMER in the last 72 hours. Hgb A1c No results for input(s): HGBA1C in the last 72 hours. Lipid Profile No results for input(s): CHOL, HDL, LDLCALC, TRIG, CHOLHDL, LDLDIRECT in the last 72 hours. Thyroid function studies No results for input(s): TSH, T4TOTAL, T3FREE, THYROIDAB in the last 72 hours.  Invalid input(s): FREET3 Anemia work up No results for input(s): VITAMINB12, FOLATE, FERRITIN, TIBC, IRON, RETICCTPCT in the last 72 hours. Urinalysis    Component Value Date/Time   COLORURINE YELLOW (A) 01/31/2019 1037   APPEARANCEUR TURBID (A) 01/31/2019 1037   APPEARANCEUR Clear 02/25/2014 1407   LABSPEC 1.014 01/31/2019 1037   LABSPEC 1.009 02/25/2014 1407   PHURINE 6.0 01/31/2019 1037   GLUCOSEU NEGATIVE 01/31/2019 1037   GLUCOSEU Negative 02/25/2014 1407   HGBUR LARGE (A) 01/31/2019 1037   BILIRUBINUR NEGATIVE 01/31/2019 1037   BILIRUBINUR Negative 02/25/2014 1407   KETONESUR NEGATIVE 01/31/2019 1037   PROTEINUR 100 (A) 01/31/2019 1037   NITRITE NEGATIVE 01/31/2019 1037   LEUKOCYTESUR LARGE (A) 01/31/2019 1037   LEUKOCYTESUR Negative 02/25/2014 1407   Sepsis Labs Invalid input(s): PROCALCITONIN,  WBC,  LACTICIDVEN Microbiology Recent Results (from the past 240 hour(s))  Today - UCx     Status: Abnormal   Collection Time: 01/31/19 10:37 AM   Specimen: Urine, Random  Result Value Ref  Range Status   Specimen Description   Final    URINE, RANDOM Performed at [facility], [address], [place], [place] [address]    Special Requests   Final    NONE Performed at [facility], [address] [place], [place] [address]    Culture MULTIPLE SPECIES PRESENT, SUGGEST RECOLLECTION (A)  Final   Report Status 02/01/2019 FINAL  Final  CULTURE, BLOOD (ROUTINE X 2) w Reflex to ID Panel     Status: None   Collection Time: 01/31/19  3:35 PM   Specimen: BLOOD  Result Value Ref Range Status   Specimen Description BLOOD RIGHT ANTECUBITAL  Final   Special Requests   Final    BOTTLES DRAWN AEROBIC AND ANAEROBIC Blood Culture adequate volume   Culture   Final    NO GROWTH 5 DAYS Performed at [facility], [address], [place],  [address]    Report Status 02/05/2019 FINAL  Final  SARS CORONAVIRUS 2 (TAT 6-24 HRS) Nasopharyngeal Nasopharyngeal Swab     Status: None   Collection Time: 01/31/19  4:21 PM   Specimen: Nasopharyngeal Swab  Result Value Ref Range Status   SARS Coronavirus 2 NEGATIVE NEGATIVE Final    Comment: (NOTE) SARS-CoV-2 target nucleic acids are NOT DETECTED. The SARS-CoV-2 RNA is generally detectable in upper and lower respiratory specimens during the acute phase of infection. Negative results do not preclude SARS-CoV-2 infection, do not rule out co-infections with other pathogens, and should not be used as the sole basis for treatment or other patient management decisions. Negative results must be combined with clinical observations, patient history, and epidemiological information. The expected result is Negative. Fact Sheet for Patients: SugarRoll.be Fact Sheet for Healthcare Providers: https://www.woods-mathews.com/ This test is not yet approved or  cleared by the [place] and  has been authorized for detection and/or diagnosis of SARS-CoV-2 by FDA under an Emergency  Use Authorization (EUA). This EUA will remain  in effect (meaning this test can be used) for the duration of the COVID-19 declaration under Section 56 4(b)(1) of the Act, 21 U.S.C. section 360bbb-3(b)(1), unless the authorization is terminated or revoked sooner. Performed at [facility], [place] [address], [place], [place] [address]   CULTURE, BLOOD (ROUTINE X 2) w Reflex to ID Panel     Status: None   Collection Time: 01/31/19  4:22 PM   Specimen: BLOOD  Result Value Ref Range Status   Specimen Description BLOOD BLOOD LEFT HAND  Final   Special Requests   Final    BOTTLES DRAWN AEROBIC AND ANAEROBIC Blood Culture adequate volume   Culture   Final    NO GROWTH 5 DAYS Performed at [facility], [address] [place], [place] [address]    Report Status 02/05/2019 FINAL  Final  SARS CORONAVIRUS 2 (TAT 6-24 HRS) Nasopharyngeal Nasopharyngeal Swab     Status: None   Collection Time: 02/04/19 12:05 PM   Specimen: Nasopharyngeal Swab  Result Value Ref Range Status   SARS Coronavirus 2 NEGATIVE NEGATIVE Final    Comment: (NOTE) SARS-CoV-2 target nucleic acids are NOT DETECTED. The SARS-CoV-2 RNA is generally detectable in upper and lower respiratory specimens during the acute phase of infection. Negative results do not preclude SARS-CoV-2 infection, do not rule out co-infections with other pathogens, and should not be used as the sole basis for treatment or other patient management decisions. Negative results must be combined with clinical observations, patient history, and epidemiological information. The expected result is Negative. Fact Sheet for Patients: SugarRoll.be Fact Sheet for Healthcare Providers: https://www.woods-mathews.com/ This test is not yet approved or cleared by the [place] FDA and  has been authorized for detection and/or diagnosis of SARS-CoV-2 by FDA under an Emergency Use Authorization (EUA). This  EUA will remain  in effect (meaning this test can be used) for the duration of the COVID-19 declaration under Section 56 4(b)(1) of the Act, 21 U.S.C. section 360bbb-3(b)(1), unless the authorization is terminated or revoked sooner. Performed at [facility], [place] [address], [place], [place] [address]      Time coordinating discharge: Over 30 minutes  SIGNED:   Wyvonnia Dusky, MD  [facility] 02/05/2019, 12:13 PM Pager   If 7PM-7AM, please contact night-coverage [email or web address] Password [email or web address]

## 2019-02-07 ENCOUNTER — Ambulatory Visit: Payer: Medicare Other | Admitting: Physician Assistant

## 2019-02-26 ENCOUNTER — Inpatient Hospital Stay
Admission: EM | Admit: 2019-02-26 | Discharge: 2019-03-07 | DRG: 871 | Disposition: A | Discharge status: Home Health | Payer: Medicare Other | Attending: Hospitalist | Admitting: Hospitalist

## 2019-02-26 ENCOUNTER — Emergency Department: Payer: Medicare Other

## 2019-02-26 ENCOUNTER — Other Ambulatory Visit: Payer: Self-pay

## 2019-02-26 ENCOUNTER — Inpatient Hospital Stay: Payer: Medicare Other

## 2019-02-26 DIAGNOSIS — N170 Acute kidney failure with tubular necrosis: Secondary | ICD-10-CM | POA: Diagnosis present

## 2019-02-26 DIAGNOSIS — Z91040 Latex allergy status: Secondary | ICD-10-CM | POA: Diagnosis not present

## 2019-02-26 DIAGNOSIS — R627 Adult failure to thrive: Secondary | ICD-10-CM | POA: Diagnosis not present

## 2019-02-26 DIAGNOSIS — Z87440 Personal history of urinary (tract) infections: Secondary | ICD-10-CM | POA: Diagnosis not present

## 2019-02-26 DIAGNOSIS — E785 Hyperlipidemia, unspecified: Secondary | ICD-10-CM | POA: Diagnosis present

## 2019-02-26 DIAGNOSIS — Z79899 Other long term (current) drug therapy: Secondary | ICD-10-CM | POA: Diagnosis not present

## 2019-02-26 DIAGNOSIS — E876 Hypokalemia: Secondary | ICD-10-CM | POA: Diagnosis not present

## 2019-02-26 DIAGNOSIS — E872 Acidosis: Secondary | ICD-10-CM | POA: Diagnosis present

## 2019-02-26 DIAGNOSIS — I10 Essential (primary) hypertension: Secondary | ICD-10-CM | POA: Diagnosis present

## 2019-02-26 DIAGNOSIS — Z888 Allergy status to other drugs, medicaments and biological substances status: Secondary | ICD-10-CM

## 2019-02-26 DIAGNOSIS — D72829 Elevated white blood cell count, unspecified: Secondary | ICD-10-CM

## 2019-02-26 DIAGNOSIS — N390 Urinary tract infection, site not specified: Secondary | ICD-10-CM | POA: Diagnosis present

## 2019-02-26 DIAGNOSIS — Z20822 Contact with and (suspected) exposure to covid-19: Secondary | ICD-10-CM | POA: Diagnosis present

## 2019-02-26 DIAGNOSIS — I120 Hypertensive chronic kidney disease with stage 5 chronic kidney disease or end stage renal disease: Secondary | ICD-10-CM | POA: Diagnosis present

## 2019-02-26 DIAGNOSIS — N2581 Secondary hyperparathyroidism of renal origin: Secondary | ICD-10-CM | POA: Diagnosis present

## 2019-02-26 DIAGNOSIS — N179 Acute kidney failure, unspecified: Secondary | ICD-10-CM | POA: Diagnosis not present

## 2019-02-26 DIAGNOSIS — Z940 Kidney transplant status: Secondary | ICD-10-CM

## 2019-02-26 DIAGNOSIS — A419 Sepsis, unspecified organism: Secondary | ICD-10-CM | POA: Diagnosis present

## 2019-02-26 DIAGNOSIS — Z87891 Personal history of nicotine dependence: Secondary | ICD-10-CM | POA: Diagnosis not present

## 2019-02-26 DIAGNOSIS — N184 Chronic kidney disease, stage 4 (severe): Secondary | ICD-10-CM | POA: Diagnosis present

## 2019-02-26 DIAGNOSIS — K219 Gastro-esophageal reflux disease without esophagitis: Secondary | ICD-10-CM | POA: Diagnosis present

## 2019-02-26 DIAGNOSIS — T8619 Other complication of kidney transplant: Secondary | ICD-10-CM | POA: Diagnosis present

## 2019-02-26 DIAGNOSIS — D631 Anemia in chronic kidney disease: Secondary | ICD-10-CM | POA: Diagnosis present

## 2019-02-26 DIAGNOSIS — F0390 Unspecified dementia without behavioral disturbance: Secondary | ICD-10-CM | POA: Diagnosis present

## 2019-02-26 DIAGNOSIS — Z85528 Personal history of other malignant neoplasm of kidney: Secondary | ICD-10-CM | POA: Diagnosis not present

## 2019-02-26 DIAGNOSIS — Z8542 Personal history of malignant neoplasm of other parts of uterus: Secondary | ICD-10-CM | POA: Diagnosis not present

## 2019-02-26 DIAGNOSIS — E860 Dehydration: Secondary | ICD-10-CM

## 2019-02-26 LAB — HEPATIC FUNCTION PANEL
ALT: 17 U/L (ref 0–44)
AST: 30 U/L (ref 15–41)
Albumin: 2.9 g/dL — ABNORMAL LOW (ref 3.5–5.0)
Alkaline Phosphatase: 90 U/L (ref 38–126)
Bilirubin, Direct: 0.3 mg/dL — ABNORMAL HIGH (ref 0.0–0.2)
Indirect Bilirubin: 0.7 mg/dL (ref 0.3–0.9)
Total Bilirubin: 1 mg/dL (ref 0.3–1.2)
Total Protein: 8.3 g/dL — ABNORMAL HIGH (ref 6.5–8.1)

## 2019-02-26 LAB — CBC
HCT: 33.5 % — ABNORMAL LOW (ref 36.0–46.0)
Hemoglobin: 9.9 g/dL — ABNORMAL LOW (ref 12.0–15.0)
MCH: 24.1 pg — ABNORMAL LOW (ref 26.0–34.0)
MCHC: 29.6 g/dL — ABNORMAL LOW (ref 30.0–36.0)
MCV: 81.5 fL (ref 80.0–100.0)
Platelets: 217 10*3/uL (ref 150–400)
RBC: 4.11 MIL/uL (ref 3.87–5.11)
RDW: 14.6 % (ref 11.5–15.5)
WBC: 17.8 10*3/uL — ABNORMAL HIGH (ref 4.0–10.5)
nRBC: 0 % (ref 0.0–0.2)

## 2019-02-26 LAB — SODIUM, URINE, RANDOM: Sodium, Ur: 45 mmol/L

## 2019-02-26 LAB — BASIC METABOLIC PANEL
Anion gap: 16 — ABNORMAL HIGH (ref 5–15)
BUN: 52 mg/dL — ABNORMAL HIGH (ref 8–23)
CO2: 21 mmol/L — ABNORMAL LOW (ref 22–32)
Calcium: 9 mg/dL (ref 8.9–10.3)
Chloride: 106 mmol/L (ref 98–111)
Creatinine, Ser: 3.56 mg/dL — ABNORMAL HIGH (ref 0.44–1.00)
GFR calc Af Amer: 14 mL/min — ABNORMAL LOW (ref >60)
GFR calc non Af Amer: 12 mL/min — ABNORMAL LOW (ref >60)
Glucose, Bld: 174 mg/dL — ABNORMAL HIGH (ref 70–99)
Potassium: 4.3 mmol/L (ref 3.5–5.1)
Sodium: 143 mmol/L (ref 135–145)

## 2019-02-26 LAB — URINALYSIS, ROUTINE W REFLEX MICROSCOPIC
Bilirubin Urine: NEGATIVE
Glucose, UA: NEGATIVE mg/dL
Ketones, ur: NEGATIVE mg/dL
Nitrite: NEGATIVE
Protein, ur: 100 mg/dL — AB
RBC / HPF: >50 RBC/hpf — ABNORMAL HIGH (ref 0–5)
Specific Gravity, Urine: 1.012 (ref 1.005–1.030)
WBC, UA: >50 WBC/hpf — ABNORMAL HIGH (ref 0–5)
pH: 5 (ref 5.0–8.0)

## 2019-02-26 LAB — LIPASE, BLOOD: Lipase: 18 U/L (ref 11–51)

## 2019-02-26 LAB — POC SARS CORONAVIRUS 2 AG -  ED: SARS Coronavirus 2 Ag: NEGATIVE

## 2019-02-26 LAB — CREATININE, URINE, RANDOM: Creatinine, Urine: 190 mg/dL

## 2019-02-26 LAB — SARS CORONAVIRUS 2 (TAT 6-24 HRS): SARS Coronavirus 2: NEGATIVE

## 2019-02-26 MED ORDER — SODIUM CHLORIDE 0.9% FLUSH: 3.0000 mL | Freq: Once | INTRAVENOUS | Status: DC

## 2019-02-26 MED ORDER — FOLIC ACID 1 MG PO TABS
1.0000 mg | ORAL_TABLET | Freq: Two times a day (BID) | ORAL | Status: DC
  Administered 2019-02-26 – 2019-03-07 (×17): 1 mg via ORAL
  Filled 2019-02-26 (×18): qty 1

## 2019-02-26 MED ORDER — ACETAMINOPHEN 650 MG RE SUPP: 650.0000 mg | Freq: Four times a day (QID) | RECTAL | Status: DC | PRN

## 2019-02-26 MED ORDER — DONEPEZIL HCL 5 MG PO TABS
5.0000 mg | ORAL_TABLET | Freq: Every day | ORAL | Status: DC
  Administered 2019-02-26 – 2019-03-06 (×8): 5 mg via ORAL
  Filled 2019-02-26 (×11): qty 1

## 2019-02-26 MED ORDER — HEPARIN SODIUM (PORCINE) 5000 UNIT/ML IJ SOLN
5000.0000 [IU] | Freq: Three times a day (TID) | INTRAMUSCULAR | Status: DC
  Administered 2019-02-26 – 2019-03-07 (×25): 5000 [IU] via SUBCUTANEOUS
  Filled 2019-02-26 (×25): qty 1

## 2019-02-26 MED ORDER — TRAZODONE HCL 50 MG PO TABS
50.0000 mg | ORAL_TABLET | Freq: Every day | ORAL | Status: DC
  Administered 2019-02-26 – 2019-03-06 (×7): 50 mg via ORAL
  Filled 2019-02-26 (×10): qty 1

## 2019-02-26 MED ORDER — ATORVASTATIN CALCIUM 20 MG PO TABS
40.0000 mg | ORAL_TABLET | Freq: Every day | ORAL | Status: DC
  Administered 2019-02-26 – 2019-03-06 (×8): 40 mg via ORAL
  Filled 2019-02-26 (×9): qty 2

## 2019-02-26 MED ORDER — ONDANSETRON HCL 4 MG PO TABS: 4.0000 mg | ORAL_TABLET | Freq: Four times a day (QID) | ORAL | Status: DC | PRN

## 2019-02-26 MED ORDER — FAMOTIDINE IN NACL 20-0.9 MG/50ML-% IV SOLN
20.0000 mg | Freq: Once | INTRAVENOUS | Status: AC
  Administered 2019-02-26: 20 mg via INTRAVENOUS
  Filled 2019-02-26: qty 50

## 2019-02-26 MED ORDER — ONDANSETRON HCL 4 MG/2ML IJ SOLN: 4.0000 mg | Freq: Four times a day (QID) | INTRAMUSCULAR | Status: DC | PRN

## 2019-02-26 MED ORDER — ACETAMINOPHEN 325 MG PO TABS
650.0000 mg | ORAL_TABLET | Freq: Four times a day (QID) | ORAL | Status: DC | PRN
  Administered 2019-02-28: 650 mg via ORAL
  Filled 2019-02-26: qty 2

## 2019-02-26 MED ORDER — METOPROLOL TARTRATE 25 MG PO TABS
25.0000 mg | ORAL_TABLET | Freq: Two times a day (BID) | ORAL | Status: DC
  Administered 2019-02-27 – 2019-02-28 (×3): 25 mg via ORAL
  Filled 2019-02-26 (×6): qty 1

## 2019-02-26 MED ORDER — SODIUM CHLORIDE 0.9 % IV BOLUS
1000.0000 mL | Freq: Once | INTRAVENOUS | Status: AC
  Administered 2019-02-26: 1000 mL via INTRAVENOUS

## 2019-02-26 MED ORDER — CALCIUM CARBONATE ANTACID 500 MG PO CHEW
1000.0000 mg | CHEWABLE_TABLET | Freq: Two times a day (BID) | ORAL | Status: DC
  Administered 2019-02-26 – 2019-03-07 (×14): 1000 mg via ORAL
  Filled 2019-02-26 (×14): qty 5

## 2019-02-26 MED ORDER — ONDANSETRON HCL 4 MG/2ML IJ SOLN
4.0000 mg | Freq: Once | INTRAMUSCULAR | Status: AC
  Administered 2019-02-26: 4 mg via INTRAVENOUS
  Filled 2019-02-26: qty 2

## 2019-02-26 MED ORDER — MYCOPHENOLATE SODIUM 180 MG PO TBEC
180.0000 mg | DELAYED_RELEASE_TABLET | Freq: Two times a day (BID) | ORAL | Status: DC
  Administered 2019-02-26 – 2019-03-07 (×17): 180 mg via ORAL
  Filled 2019-02-26 (×21): qty 1

## 2019-02-26 MED ORDER — CITALOPRAM HYDROBROMIDE 20 MG PO TABS
20.0000 mg | ORAL_TABLET | Freq: Every day | ORAL | Status: DC
  Administered 2019-02-26 – 2019-03-07 (×10): 20 mg via ORAL
  Filled 2019-02-26 (×10): qty 1

## 2019-02-26 MED ORDER — ACETAMINOPHEN 325 MG PO TABS: 650.0000 mg | ORAL_TABLET | Freq: Four times a day (QID) | ORAL | Status: DC | PRN

## 2019-02-26 MED ORDER — CALCITRIOL 0.25 MCG PO CAPS
0.2500 ug | ORAL_CAPSULE | Freq: Every day | ORAL | Status: DC
  Administered 2019-02-26 – 2019-03-01 (×3): 0.25 ug via ORAL
  Filled 2019-02-26 (×5): qty 1

## 2019-02-26 MED ORDER — VITAMIN D 25 MCG (1000 UNIT) PO TABS
1000.0000 [IU] | ORAL_TABLET | Freq: Every day | ORAL | Status: DC
  Administered 2019-02-26 – 2019-03-07 (×10): 1000 [IU] via ORAL
  Filled 2019-02-26 (×10): qty 1

## 2019-02-26 MED ORDER — SIROLIMUS 1 MG PO TABS
3.0000 mg | ORAL_TABLET | Freq: Every day | ORAL | Status: DC
  Administered 2019-02-28 – 2019-03-07 (×8): 3 mg via ORAL
  Filled 2019-02-26 (×8): qty 3

## 2019-02-26 MED ORDER — SODIUM CHLORIDE 0.9 % IV SOLN: INTRAVENOUS | Status: DC

## 2019-02-26 MED ORDER — ENSURE ENLIVE PO LIQD: 237.0000 mL | Freq: Two times a day (BID) | ORAL | Status: DC

## 2019-02-26 NOTE — H&P (Signed)
TRH H&P   Patient Demographics:    [name], is a 69 y.o. female  MRN: [id number]   DOB - [birth year]  Admit Date - 02/26/2019  Outpatient Primary MD for the patient is Baxter Hire, MD  Referring MD: Dr. Joni Fears  Outpatient Specialists: Dr. Candiss Norse (nephrology)  Patient coming from: Home  Chief Complaint  Patient presents with  . Failure To Thrive      HPI:   History obtained from ED physician and patient's daughter as patient is severely demented.   [name]  is a 69 y.o. female, with history of chronic kidney disease stage IV, history of renal transplant in 2019 on immunosuppression, anemia of chronic disease, secondary hyperparathyroidism, essential hypertension, history of C. difficile colitis who was hospitalized 3 weeks back for acute on chronic kidney disease stage IV thought to be prerenal with dehydration and improved with IV fluids.  She was discharged to SNF from where she returned home 1 week back. As per daughter patient has not been eating or drinking after returning from SNF, and has been progressively weak.  Patient complained of nausea but denied any fevers, chills, headache, complains of dizziness, chest pain, shortness of breath, abdominal pain,  vomiting, diarrhea, fall, cough.  No recent contact with anyone with COVID-19.  No documented weight loss.  Course in the ED Vitals stable.  Blood work showed significant leukocytosis (17.8k), hemoglobin of 9.9, BUN of 52 and creatinine of 3.56 (baseline of 1.7).  Glucose of 174.  Albumin of 2.9, LFTs otherwise normal.  POC Covid 19 was negative, PCR pending. Patient given IV fluid bolus and hospitalist consulted for admission to medical floor for acute on chronic kidney disease, severe dehydration and failure to thrive.   Review of systems:    As outlined in HPI.  Patient severely demented to  provide any history.  With Past History of the following :    Past Medical History:  Diagnosis Date  . Anemia   . C. difficile colitis   . Cancer of kidney ([place])   . Dementia ([place])   . GERD (gastroesophageal reflux disease)   . Hypertension   . Renal disorder   . Uterine cancer [facility])       Past Surgical History:  Procedure Laterality Date  . ABDOMINAL HYSTERECTOMY    . AV FISTULA PLACEMENT    . BREAST BIOPSY     Patient is unsure but she thinks she remember a Bx  . BREAST BIOPSY Right 2017   benign  . KIDNEY TRANSPLANT    . NEPHRECTOMY TRANSPLANTED ORGAN        Social History:     Social History   Tobacco Use  . Smoking status: Former Research scientist (life sciences)  . Smokeless tobacco: Never Used  Substance Use Topics  . Alcohol use: No     Lives -Home with daughter  Mobility -independent  Family History :     Family History  Problem Relation Age of Onset  . Stroke Mother   . Alcohol abuse Father   . Breast cancer Neg Hx       Home Medications:   Prior to Admission medications   Medication Sig Start Date End Date Taking? Authorizing Provider  acetaminophen (TYLENOL) 325 MG tablet Take 2 tablets (650 mg total) by mouth every 6 (six) hours as needed for mild pain or fever. 02/05/19 03/07/19  Wyvonnia Dusky, MD  atorvastatin (LIPITOR) 40 MG tablet Take 40 mg by mouth daily.     [provider]  calcitRIOL (ROCALTROL) 0.25 MCG capsule Take 0.25 mcg by mouth daily.    [provider]  calcium elemental as carbonate (TUMS ULTRA 1000) 400 MG chewable tablet Chew 1,000 mg by mouth 2 (two) times daily.     [provider]  cholecalciferol (VITAMIN D3) 25 MCG (1000 UT) tablet Take 1,000 Units by mouth daily.    [provider]  citalopram (CELEXA) 20 MG tablet Take 20 mg by mouth daily.    [provider]  donepezil (ARICEPT) 5 MG tablet Take 5 mg by mouth at bedtime.     [provider]  feeding supplement, ENSURE ENLIVE,  (ENSURE ENLIVE) LIQD Take 237 mLs by mouth 2 (two) times daily between meals. 09/27/18   Saundra Shelling, MD  folic acid (FOLVITE) 1 MG tablet Take 1 mg by mouth 2 (two) times daily.     [provider]  furosemide (LASIX) 20 MG tablet Take 1 tablet (20 mg total) by mouth daily. Patient taking differently: Take 20 mg by mouth every other day.  08/17/18   Gladstone Lighter, MD  metoprolol tartrate (LOPRESSOR) 25 MG tablet Take 25 mg by mouth 2 (two) times daily.    [provider]  mycophenolate (MYFORTIC) 180 MG EC tablet Take 180 mg by mouth 2 (two) times daily.    [provider]  potassium chloride (K-DUR) 10 MEQ tablet Take 2 tablets (20 mEq total) by mouth daily. ONLY WHILE TAKING LASIX Patient taking differently: Take 20 mEq by mouth 2 (two) times daily.  08/17/18   Gladstone Lighter, MD  sirolimus (RAPAMUNE) 1 MG tablet Take 3 mg by mouth daily.     [provider]  traZODone (DESYREL) 50 MG tablet Take 50 mg by mouth at bedtime.  10/11/18 10/11/19  [provider]     Allergies:     Allergies  Allergen Reactions  . Ace Inhibitors Cough  . Latex Swelling  . Lisinopril Cough     Physical Exam:   Vitals  Blood pressure 116/65, pulse 63, temperature (!) 97.4 F (36.3 C), temperature source Oral, resp. rate 19, height 4\' 11"  (1.499 m), weight 90.7 kg, SpO2 98 %.   General: Elderly female lying in bed in no acute distress, HEENT: Pupils reactive bilaterally, EOMI, pallor present, no icterus, dry oral mucosa, temporal wasting, supple neck, no cervical pain with Chest: Clear to auscultation bilaterally CVS: Normal S1-S2, no murmurs rub or gallop GI: Soft, nondistended, nontender, bowel sounds present Musculoskeletal: Warm, no edema CNS: Alert and awake, oriented x0, nonfocal   Data Review:    CBC Recent Labs  Lab 02/26/19 1242  WBC 17.8*  HGB 9.9*  HCT 33.5*  PLT 217  MCV 81.5  MCH 24.1*  MCHC 29.6*  RDW 14.6    ------------------------------------------------------------------------------------------------------------------  Chemistries  Recent Labs  Lab 02/26/19 1242  NA 143  K  4.3  CL 106  CO2 21*  GLUCOSE 174*  BUN 52*  CREATININE 3.56*  CALCIUM 9.0  AST 30  ALT 17  ALKPHOS 90  BILITOT 1.0   ------------------------------------------------------------------------------------------------------------------ estimated creatinine clearance is 14.9 mL/min (A) (by C-G formula based on SCr of 3.56 mg/dL (H)). ------------------------------------------------------------------------------------------------------------------ No results for input(s): TSH, T4TOTAL, T3FREE, THYROIDAB in the last 72 hours.  Invalid input(s): FREET3  Coagulation profile No results for input(s): INR, PROTIME in the last 168 hours. ------------------------------------------------------------------------------------------------------------------- No results for input(s): DDIMER in the last 72 hours. -------------------------------------------------------------------------------------------------------------------  Cardiac Enzymes No results for input(s): CKMB, TROPONINI, MYOGLOBIN in the last 168 hours.  Invalid input(s): CK ------------------------------------------------------------------------------------------------------------------ No results found for: BNP   ---------------------------------------------------------------------------------------------------------------  Urinalysis    Component Value Date/Time   COLORURINE YELLOW (A) 01/31/2019 1037   APPEARANCEUR TURBID (A) 01/31/2019 1037   APPEARANCEUR Clear 02/25/2014 1407   LABSPEC 1.014 01/31/2019 1037   LABSPEC 1.009 02/25/2014 1407   PHURINE 6.0 01/31/2019 1037   GLUCOSEU NEGATIVE 01/31/2019 1037   GLUCOSEU Negative 02/25/2014 1407   HGBUR LARGE (A) 01/31/2019 1037   BILIRUBINUR NEGATIVE 01/31/2019 1037   BILIRUBINUR Negative  02/25/2014 1407   KETONESUR NEGATIVE 01/31/2019 1037   PROTEINUR 100 (A) 01/31/2019 1037   NITRITE NEGATIVE 01/31/2019 1037   LEUKOCYTESUR LARGE (A) 01/31/2019 1037   LEUKOCYTESUR Negative 02/25/2014 1407    ----------------------------------------------------------------------------------------------------------------   Imaging Results:    CT ABDOMEN PELVIS WO CONTRAST  Result Date: 02/26/2019 CLINICAL DATA:  Acute generalized abdominal pain, nausea, vomiting. EXAM: CT ABDOMEN AND PELVIS WITHOUT CONTRAST TECHNIQUE: Multidetector CT imaging of the abdomen and pelvis was performed following the standard protocol without IV contrast. COMPARISON:  November 30, 2018. FINDINGS: Lower chest: Small pericardial effusion. Visualized lung bases are unremarkable. Hepatobiliary: No focal liver abnormality is seen. Status post cholecystectomy. No biliary dilatation. Pancreas: Unremarkable. No pancreatic ductal dilatation or surrounding inflammatory changes. Spleen: Normal in size without focal abnormality. Adrenals/Urinary Tract: Adrenal glands are unremarkable. Severe right renal atrophy is noted consistent with end-stage renal disease. Status post left nephrectomy. No hydronephrosis or renal obstruction is noted. Renal transplant is noted in right lower quadrant without hydronephrosis. Urinary bladder is unremarkable. Stomach/Bowel: The stomach appears normal. There is no evidence of bowel obstruction or inflammation. The appendix is not visualized. Vascular/Lymphatic: Aortic atherosclerosis. No enlarged abdominal or pelvic lymph nodes. Reproductive: Status post hysterectomy. No adnexal masses. Other: No abdominal wall hernia or abnormality. No abdominopelvic ascites. 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 No acute osseous abnormality is noted. IMPRESSION: 1. Small pericardial effusion. 2. Severe right renal atrophy consistent with end-stage renal disease. 3. Status post left  nephrectomy and right lower quadrant renal transplant. 4. 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 5. Aortic atherosclerosis. Aortic Atherosclerosis (ICD10-I70.0). Electronically Signed   By: Marijo Conception M.D.   On: 02/26/2019 14:28    My personal review of EKG: Pending  Assessment & Plan:    Principal Problem:   Acute renal failure superimposed on stage 4 chronic kidney disease (HCC) Likely prerenal with severe dehydration, poor p.o. intake. Hold Lasix.  Check UA, urine sodium and creatinine.  Monitor renal function closely.  Avoid nephrotoxins.  Nephrology consult if renal function unimproved with hydration. Continue calcitriol, calcium carbonate and vitamin D3.  Active Problems: Leukocytosis No clear source of infection.  Follow UA and chest x-ray.  Could be due to severe dehydration.  Monitor in a.m. lab.  Failure to thrive in adult  PT and nutrition consult.    Essential hypertension Stable.  Continue beta-blocker    H/O kidney transplant Continue mycophenolate and sirolimus.     Dementia, severe (HCC) Continue Aricept.  Anemia of chronic kidney disease Stable.    CODE STATUS: Full code, goals of care and possibility for obtaining palliative care consult for further discussion was brought up to the daughter by the hospitalist during last hospitalization but she requested for full scope of treatment.     DVT Prophylaxis subcu heparin  AM Labs Ordered, also please review Full Orders  Family Communication: Admission, patients condition and plan of care including tests being ordered have been discussed with daughter on the phone.  Full code  Likely DC to home versus SNF  Condition GUARDED    Consults called: None  Admission status: Inpatient Patient presenting with severe dehydration with significant failure to thrive in the setting of poor p.o. intake for past 1 week.  She has acute kidney injury superimposed on chronic kidney disease  stage IV (renal function worsened more than 50% of her baseline) with high risk for going into progressive renal failure requiring renal replacement therapy.  For this she needs to be monitored in an inpatient setting with aggressive IV hydration and close monitoring of her renal function for at least >2 midnight.  Time spent in minutes : 50   Brayley Mackowiak M.D on 02/26/2019 at 4:21 PM  Between 7am to 7pm - Pager - [phone]. After 7pm go to [email or web address] - password [facility]  [facility] - Office  [phone]

## 2019-02-26 NOTE — Progress Notes (Signed)
Attempted I/O cath for urine sample with NT, unsuccessful at this time. Patient placed on bedpan and encouraged to void.

## 2019-02-26 NOTE — ED Notes (Signed)
Pt is disoriented in triage and unable to sit up in the wheelchair, pt attempting to get up, concern for fall risk.[name]

## 2019-02-26 NOTE — ED Triage Notes (Signed)
Pt is a pour historian and is unable to tell me what is wrong, read previous triage note

## 2019-02-26 NOTE — ED Provider Notes (Signed)
[facility] Emergency Department Provider Note  ____________________________________________  Time seen: Approximately 2:37 PM  I have reviewed the triage vital signs and the nursing notes.   HISTORY  Chief Complaint Failure To Thrive  Level 5 Caveat: Portions of the History and Physical including HPI and review of systems are unable to be completely obtained due to patient being a poor historian related to chronic dementia   HPI [name] is a 69 y.o. female with a history of anemia, dementia, GERD hypertension uterine cancer end-stage renal disease status post kidney transplant (October 2019, [facility]) managed locally by Dr. Candiss Norse of nephrology who comes the ED today due to not eating or drinking for the past week.  Patient had been hospitalized, sent to [facility] for rehab, released back home a week ago.  Since then she has not been able to eat or drink.  States that she feels weak, denies shortness of breath cough fevers chills.  Denies any vomiting or diarrhea.    Past Medical History:  Diagnosis Date  . Anemia   . C. difficile colitis   . Cancer of kidney ([place])   . Dementia ([place])   . GERD (gastroesophageal reflux disease)   . Hypertension   . Renal disorder   . Uterine cancer [facility])      Patient Active Problem List   Diagnosis Date Noted  . Pressure injury of skin 02/04/2019  . AKI (acute kidney injury) ([place]) 02/01/2019  . Shaking 01/31/2019  . Hyperkalemia 01/31/2019  . Acute renal failure superimposed on stage 4 chronic kidney disease ([place]) 01/31/2019  . HLD (hyperlipidemia) 01/31/2019  . Acute lower UTI 11/07/2018  . C. difficile colitis 11/07/2018  . Acute encephalopathy 09/22/2018  . Renal failure (ARF), acute on chronic ([place]) 08/12/2018  . Confusion 03/23/2018  . Hypokalemia 06/01/2017  . Sepsis ([place]) 06/29/2016  . UTI (urinary tract infection) 06/29/2016  . GERD (gastroesophageal reflux disease) 06/29/2016  .  Dementia ([place]) 06/29/2016  . 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 Essential hypertension 07/06/2014  . H/O kidney transplant 07/06/2014     Past Surgical History:  Procedure Laterality Date  . ABDOMINAL HYSTERECTOMY    . AV FISTULA PLACEMENT    . BREAST BIOPSY     Patient is unsure but she thinks she remember a Bx  . BREAST BIOPSY Right 2017   benign  . KIDNEY TRANSPLANT    . NEPHRECTOMY TRANSPLANTED ORGAN       Prior to Admission medications   Medication Sig Start Date End Date Taking? Authorizing Provider  acetaminophen (TYLENOL) 325 MG tablet Take 2 tablets (650 mg total) by mouth every 6 (six) hours as needed for mild pain or fever. 02/05/19 03/07/19  Wyvonnia Dusky, MD  atorvastatin (LIPITOR) 40 MG tablet Take 40 mg by mouth daily.     [provider]  calcitRIOL (ROCALTROL) 0.25 MCG capsule Take 0.25 mcg by mouth daily.    [provider]  calcium elemental as carbonate (TUMS ULTRA 1000) 400 MG chewable tablet Chew 1,000 mg by mouth 2 (two) times daily.     [provider]  cholecalciferol (VITAMIN D3) 25 MCG (1000 UT) tablet Take 1,000 Units by mouth daily.    [provider]  citalopram (CELEXA) 20 MG tablet Take 20 mg by mouth daily.    [provider]  donepezil (ARICEPT) 5 MG tablet Take 5 mg by mouth at bedtime.     [provider]  feeding supplement, ENSURE ENLIVE, (  ENSURE ENLIVE) LIQD Take 237 mLs by mouth 2 (two) times daily between meals. 09/27/18   Saundra Shelling, MD  folic acid (FOLVITE) 1 MG tablet Take 1 mg by mouth 2 (two) times daily.     [provider]  furosemide (LASIX) 20 MG tablet Take 1 tablet (20 mg total) by mouth daily. Patient taking differently: Take 20 mg by mouth every other day.  08/17/18   Gladstone Lighter, MD  metoprolol tartrate (LOPRESSOR) 25 MG tablet Take 25 mg by mouth 2 (two) times daily.    [provider]  mycophenolate (MYFORTIC) 180 MG EC tablet Take 180  mg by mouth 2 (two) times daily.    [provider]  potassium chloride (K-DUR) 10 MEQ tablet Take 2 tablets (20 mEq total) by mouth daily. ONLY WHILE TAKING LASIX Patient taking differently: Take 20 mEq by mouth 2 (two) times daily.  08/17/18   Gladstone Lighter, MD  sirolimus (RAPAMUNE) 1 MG tablet Take 3 mg by mouth daily.     [provider]  traZODone (DESYREL) 50 MG tablet Take 50 mg by mouth at bedtime.  10/11/18 10/11/19  [provider]     Allergies Ace inhibitors, Latex, and Lisinopril   Family History  Problem Relation Age of Onset  . Stroke Mother   . Alcohol abuse Father   . Breast cancer Neg Hx     Social History Social History   Tobacco Use  . Smoking status: Former Research scientist (life sciences)  . Smokeless tobacco: Never Used  Substance Use Topics  . Alcohol use: No  . Drug use: No    Review of Systems  Constitutional:   No fever or chills.  ENT:   No sore throat. No rhinorrhea. Cardiovascular:   No chest pain or syncope. Respiratory:   No dyspnea or cough. Gastrointestinal:   Negative for abdominal pain, vomiting and diarrhea.  Musculoskeletal:   Negative for focal pain or swelling All other systems reviewed and are negative except as documented above in ROS and HPI.  ____________________________________________   PHYSICAL EXAM:  VITAL SIGNS: ED Triage Vitals  Enc Vitals Group     BP 02/26/19 1219 (!) 103/47     Pulse Rate 02/26/19 1219 82     Resp 02/26/19 1219 (!) 22     Temp 02/26/19 1219 (!) 97.4 F (36.3 C)     Temp Source 02/26/19 1219 Oral     SpO2 02/26/19 1219 96 %     Weight 02/26/19 1222 200 lb (90.7 kg)     Height 02/26/19 1222 4\' 11"  (1.499 m)     Head Circumference --      [facility] --      Pain Score --      Pain Loc --      Pain Edu? --      Excl. in [place]? --     Vital signs reviewed, nursing assessments reviewed.   Constitutional:   Alert and oriented to self.  Ill-appearing. Eyes:   Conjunctivae are normal. EOMI.  PERRL. ENT      Head:   Normocephalic and atraumatic.      Nose:   Normal, no epistaxis      Mouth/Throat:   Dry mucous membranes, cracked lips.      Neck:   No meningismus. Full ROM. Hematological/Lymphatic/Immunilogical:   No cervical lymphadenopathy. Cardiovascular:   RRR. Symmetric bilateral radial and DP pulses.  No murmurs. Cap refill less than 2 seconds. Respiratory:   Normal respiratory effort  without tachypnea/retractions. Breath sounds are clear and equal bilaterally. No wheezes/rales/rhonchi. Gastrointestinal:   Soft with generalized tenderness. Non distended. There is no CVA tenderness.  No rebound, rigidity, or guarding. Musculoskeletal:   Normal range of motion in all extremities. No joint effusions.  No lower extremity tenderness.  No edema. Neurologic:   Normal speech and language.  Motor grossly intact. No acute focal neurologic deficits are appreciated.  Skin:    Skin is warm, dry and intact. No rash noted.  No petechiae, purpura, or bullae.  ____________________________________________    LABS (pertinent positives/negatives) (all labs ordered are listed, but only abnormal results are displayed) Labs Reviewed  BASIC METABOLIC PANEL - Abnormal; Notable for the following components:      Result Value   CO2 21 (*)    Glucose, Bld 174 (*)    BUN 52 (*)    Creatinine, Ser 3.56 (*)    GFR calc non Af Amer 12 (*)    GFR calc Af Amer 14 (*)    Anion gap 16 (*)    All other components within normal limits  CBC - Abnormal; Notable for the following components:   WBC 17.8 (*)    Hemoglobin 9.9 (*)    HCT 33.5 (*)    MCH 24.1 (*)    MCHC 29.6 (*)    All other components within normal limits  HEPATIC FUNCTION PANEL - Abnormal; Notable for the following components:   Total Protein 8.3 (*)    Albumin 2.9 (*)    Bilirubin, Direct 0.3 (*)    All other components within normal limits  SARS CORONAVIRUS 2 (TAT 6-24 HRS)  LIPASE, BLOOD  URINALYSIS, COMPLETE (UACMP) WITH  MICROSCOPIC  POC SARS CORONAVIRUS 2 AG -  ED  CBG MONITORING, ED   ____________________________________________   EKG  Interpreted by me Sinus tachycardia rate 112, left axis, normal intervals.  Poor R wave progression.  Normal ST segments and T waves.  Frequent PACs  Repeat EKG obtained at 2:37 PM Sinus rhythm rate of 93, no acute ischemic changes.  Frequent PACs  ____________________________________________    RADIOLOGY  CT ABDOMEN PELVIS WO CONTRAST  Result Date: 02/26/2019 CLINICAL DATA:  Acute generalized abdominal pain, nausea, vomiting. EXAM: CT ABDOMEN AND PELVIS WITHOUT CONTRAST TECHNIQUE: Multidetector CT imaging of the abdomen and pelvis was performed following the standard protocol without IV contrast. COMPARISON:  November 30, 2018. FINDINGS: Lower chest: Small pericardial effusion. Visualized lung bases are unremarkable. Hepatobiliary: No focal liver abnormality is seen. Status post cholecystectomy. No biliary dilatation. Pancreas: Unremarkable. No pancreatic ductal dilatation or surrounding inflammatory changes. Spleen: Normal in size without focal abnormality. Adrenals/Urinary Tract: Adrenal glands are unremarkable. Severe right renal atrophy is noted consistent with end-stage renal disease. Status post left nephrectomy. No hydronephrosis or renal obstruction is noted. Renal transplant is noted in right lower quadrant without hydronephrosis. Urinary bladder is unremarkable. Stomach/Bowel: The stomach appears normal. There is no evidence of bowel obstruction or inflammation. The appendix is not visualized. Vascular/Lymphatic: Aortic atherosclerosis. No enlarged abdominal or pelvic lymph nodes. Reproductive: Status post hysterectomy. No adnexal masses. Other: No abdominal wall hernia or abnormality. No abdominopelvic ascites. 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 No acute osseous abnormality is noted. IMPRESSION: 1. Small pericardial  effusion. 2. Severe right renal atrophy consistent with end-stage renal disease. 3. Status post left nephrectomy and right lower quadrant renal transplant. 4. 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 5. Aortic atherosclerosis. Aortic Atherosclerosis (ICD10-I70.0).  Electronically Signed   By: Marijo Conception M.D.   On: 02/26/2019 14:28    ____________________________________________   PROCEDURES Procedures  ____________________________________________  DIFFERENTIAL DIAGNOSIS   Dehydration, electrolyte abnormality, diverticulitis, cystitis, pyelonephritis, COVID-19, GERD, pancreatitis  CLINICAL IMPRESSION / ASSESSMENT AND PLAN / ED COURSE  Medications ordered in the ED: Medications  sodium chloride flush (NS) 0.9 % injection 3 mL (3 mLs Intravenous Refused 02/26/19 1332)  ondansetron (ZOFRAN) injection 4 mg (has no administration in time range)  famotidine (PEPCID) IVPB 20 mg premix (has no administration in time range)  sodium chloride 0.9 % bolus 1,000 mL (1,000 mLs Intravenous New Bag/Given 02/26/19 1348)    Pertinent labs & imaging results that were available during my care of the patient were reviewed by me and considered in my medical decision making (see chart for details).  [name] was evaluated in Emergency Department on 02/26/2019 for the symptoms described in the history of present illness. She was evaluated in the context of the global COVID-19 pandemic, which necessitated consideration that the patient might be at risk for infection with the SARS-CoV-2 virus that causes COVID-19. Institutional protocols and algorithms that pertain to the evaluation of patients at risk for COVID-19 are in a state of rapid change based on information released by regulatory bodies including the CDC and federal and state organizations. These policies and algorithms were followed during the patient's care in the ED.   Patient with dementia presents for not taking p.o.,  failure to thrive.  Clinically she is very dehydrated though vital signs are normal.  Labs reveal AKI on CKD.  Baseline creatinine is about 1.5 and today her creatinine is 3.5.  This is especially worrisome in the setting of her kidney transplant with prerenal stress and not taking her immunosuppression medication.  No evidence of pneumonia.  Will check urinalysis for signs of infection given her leukocytosis.  Plan to admit for further hydration and monitoring of clinical progress.  Rapid point-of-care Covid test negative, will send a PCR.  IV fluids for hydration.  IV Zofran and Pepcid to alleviate potential GI symptoms and allow her to increase her oral intake and medication intake.      ____________________________________________   FINAL CLINICAL IMPRESSION(S) / ED DIAGNOSES    Final diagnoses:  Acute renal failure superimposed on stage 3b chronic kidney disease, unspecified acute renal failure type [facility])  Kidney transplant recipient  Dehydration     ED Discharge Orders    None      Portions of this note were generated with dragon dictation software. Dictation errors may occur despite best attempts at proofreading.   Carrie Mew, MD 02/26/19 1444

## 2019-02-26 NOTE — ED Notes (Signed)
Spoke with daughter. Pt likes ensure clear. Pt cannot have milk products due to lactose intolerance. Daughter aware that pt needs one of her home meds brought to the hospital.

## 2019-02-26 NOTE — ED Notes (Signed)
Patient seems very confused, lethargic. Able to respond to name and some direction but unable to answer questions. Does not appear to understand situation. Applied purewick to obtain urine sample. Patient is extremely stiff and resistant to moving her extremities, but if she does not provide urine sample we may have to attempt an in and out cath again

## 2019-02-26 NOTE — ED Triage Notes (Signed)
Pt in via EMS from home. EMS reports pt was released from [facility] last Wednesday and per family she has not eaten or drank since then. Pt was there for generalized weakness, pt with hx of dementia.

## 2019-02-27 DIAGNOSIS — E860 Dehydration: Secondary | ICD-10-CM

## 2019-02-27 LAB — CBC
HCT: 28.7 % — ABNORMAL LOW (ref 36.0–46.0)
Hemoglobin: 8.4 g/dL — ABNORMAL LOW (ref 12.0–15.0)
MCH: 24.2 pg — ABNORMAL LOW (ref 26.0–34.0)
MCHC: 29.3 g/dL — ABNORMAL LOW (ref 30.0–36.0)
MCV: 82.7 fL (ref 80.0–100.0)
Platelets: 155 10*3/uL (ref 150–400)
RBC: 3.47 MIL/uL — ABNORMAL LOW (ref 3.87–5.11)
RDW: 14.8 % (ref 11.5–15.5)
WBC: 12.6 10*3/uL — ABNORMAL HIGH (ref 4.0–10.5)
nRBC: 0 % (ref 0.0–0.2)

## 2019-02-27 LAB — BASIC METABOLIC PANEL
Anion gap: 11 (ref 5–15)
BUN: 57 mg/dL — ABNORMAL HIGH (ref 8–23)
CO2: 18 mmol/L — ABNORMAL LOW (ref 22–32)
Calcium: 8 mg/dL — ABNORMAL LOW (ref 8.9–10.3)
Chloride: 116 mmol/L — ABNORMAL HIGH (ref 98–111)
Creatinine, Ser: 3.56 mg/dL — ABNORMAL HIGH (ref 0.44–1.00)
GFR calc Af Amer: 14 mL/min — ABNORMAL LOW (ref >60)
GFR calc non Af Amer: 12 mL/min — ABNORMAL LOW (ref >60)
Glucose, Bld: 103 mg/dL — ABNORMAL HIGH (ref 70–99)
Potassium: 5 mmol/L (ref 3.5–5.1)
Sodium: 145 mmol/L (ref 135–145)

## 2019-02-27 MED ORDER — SODIUM CHLORIDE 0.9 % IV SOLN
1.0000 g | INTRAVENOUS | Status: DC
  Administered 2019-02-27 – 2019-03-01 (×3): 1 g via INTRAVENOUS
  Filled 2019-02-27 (×3): qty 10

## 2019-02-27 MED ORDER — SODIUM BICARBONATE-DEXTROSE 150-5 MEQ/L-% IV SOLN
150.0000 meq | INTRAVENOUS | Status: DC
  Administered 2019-02-27 – 2019-03-02 (×6): 150 meq via INTRAVENOUS
  Filled 2019-02-27 (×11): qty 1000

## 2019-02-27 MED ORDER — ADULT MULTIVITAMIN W/MINERALS CH
1.0000 | ORAL_TABLET | Freq: Every day | ORAL | Status: DC
  Administered 2019-02-28 – 2019-03-07 (×8): 1 via ORAL
  Filled 2019-02-27 (×8): qty 1

## 2019-02-27 MED ORDER — BOOST / RESOURCE BREEZE PO LIQD CUSTOM
1.0000 | Freq: Three times a day (TID) | ORAL | Status: DC
  Administered 2019-02-27 – 2019-03-07 (×18): 1 via ORAL

## 2019-02-27 NOTE — Progress Notes (Signed)
Brief Hx: [name]  is a 69 y.o. female, with history of chronic kidney disease stage IV, history of renal transplant in 2019 on immunosuppression, anemia of chronic disease, secondary hyperparathyroidism, essential hypertension, history of C. difficile colitis who was hospitalized 3 weeks back for acute on chronic kidney disease stage IV thought to be prerenal with dehydration and improved with IV fluids.  She was discharged to SNF from where she returned home 1 week back. As per daughter patient has not been eating or drinking after returning from SNF, and has been progressively weak.  Course in the ED Vitals stable.  Blood work showed significant leukocytosis (17.8k), hemoglobin of 9.9, BUN of 52 and creatinine of 3.56 (baseline of 1.7).  Glucose of 174.  Albumin of 2.9, LFTs otherwise normal.  POC Covid 19 was negative, PCR pending.  Subjective: Appears somewhat slow in answering to my question.  Feeling tired and fatigued.  Patient is to work with her physical therapy this morning.  Objective: Vital signs in last 24 hours: Temp:  [97.5 F (36.4 C)-98.5 F (36.9 C)] 98.5 F (36.9 C) (01/13 1151) Pulse Rate:  [29-88] 73 (01/13 1151) Resp:  [13-20] 18 (01/13 1151) BP: (89-143)/(48-96) 109/56 (01/13 1151) SpO2:  [92 %-100 %] 100 % (01/13 1151)  Intake/Output from previous day: 01/12 0701 - 01/13 0700 In: -  Out: 50 [Urine:50] Intake/Output this shift: No intake/output data recorded.   General: Elderly female lying in bed in no acute distress, HEENT: Pupils reactive bilaterally, EOMI, pallor present, no icterus, dry oral mucosa, temporal wasting, supple neck, no cervical pain with Chest: Clear to auscultation bilaterally CVS: Normal S1-S2, no murmurs rub or gallop GI: Soft, nondistended, nontender, bowel sounds present Musculoskeletal: Warm, no edema CNS: Alert and awake, oriented x0, nonfocal  Results for orders placed or performed during the hospital encounter of 02/26/19  (from the past 24 hour(s))  SARS CORONAVIRUS 2 (TAT 6-24 HRS) Nasopharyngeal Nasopharyngeal Swab     Status: None   Collection Time: 02/26/19  2:11 PM   Specimen: Nasopharyngeal Swab  Result Value Ref Range   SARS Coronavirus 2 NEGATIVE NEGATIVE  POC SARS Coronavirus 2 Ag-ED - Nasal Swab (BD Veritor Kit)     Status: None   Collection Time: 02/26/19  2:14 PM  Result Value Ref Range   SARS Coronavirus 2 Ag Negative Negative  Urinalysis, Routine w reflex microscopic     Status: Abnormal   Collection Time: 02/26/19 10:32 PM  Result Value Ref Range   Color, Urine YELLOW (A) YELLOW   APPearance TURBID (A) CLEAR   Specific Gravity, Urine 1.012 1.005 - 1.030   pH 5.0 5.0 - 8.0   Glucose, UA NEGATIVE NEGATIVE mg/dL   Hgb urine dipstick MODERATE (A) NEGATIVE   Bilirubin Urine NEGATIVE NEGATIVE   Ketones, ur NEGATIVE NEGATIVE mg/dL   Protein, ur 100 (A) NEGATIVE mg/dL   Nitrite NEGATIVE NEGATIVE   Leukocytes,Ua LARGE (A) NEGATIVE   RBC / HPF >50 (H) 0 - 5 RBC/hpf   WBC, UA >50 (H) 0 - 5 WBC/hpf   Bacteria, UA MANY (A) NONE SEEN   Squamous Epithelial / LPF 6-10 0 - 5   WBC Clumps PRESENT    Mucus PRESENT   Urine culture     Status: None (Preliminary result)   Collection Time: 02/26/19 10:32 PM   Specimen: Urine, Clean Catch  Result Value Ref Range   Specimen Description      URINE, CLEAN CATCH Performed at [facility],  [place], [place] [address]    Special Requests      NONE Performed at [facility], [place] [address] [place], [place] [address]    Culture PENDING    Report Status PENDING   Sodium, urine, random     Status: None   Collection Time: 02/26/19 10:32 PM  Result Value Ref Range   Sodium, Ur 45 mmol/L  Creatinine, urine, random     Status: None   Collection Time: 02/26/19 10:32 PM  Result Value Ref Range   Creatinine, Urine 190 mg/dL  Basic metabolic panel     Status: Abnormal   Collection Time: 02/27/19  8:19 AM  Result Value Ref  Range   Sodium 145 135 - 145 mmol/L   Potassium 5.0 3.5 - 5.1 mmol/L   Chloride 116 (H) 98 - 111 mmol/L   CO2 18 (L) 22 - 32 mmol/L   Glucose, Bld 103 (H) 70 - 99 mg/dL   BUN 57 (H) 8 - 23 mg/dL   Creatinine, Ser 3.56 (H) 0.44 - 1.00 mg/dL   Calcium 8.0 (L) 8.9 - 10.3 mg/dL   GFR calc non Af Amer 12 (L) >60 mL/min   GFR calc Af Amer 14 (L) >60 mL/min   Anion gap 11 5 - 15  CBC     Status: Abnormal   Collection Time: 02/27/19  8:35 AM  Result Value Ref Range   WBC 12.6 (H) 4.0 - 10.5 K/uL   RBC 3.47 (L) 3.87 - 5.11 MIL/uL   Hemoglobin 8.4 (L) 12.0 - 15.0 g/dL   HCT 28.7 (L) 36.0 - 46.0 %   MCV 82.7 80.0 - 100.0 fL   MCH 24.2 (L) 26.0 - 34.0 pg   MCHC 29.3 (L) 30.0 - 36.0 g/dL   RDW 14.8 11.5 - 15.5 %   Platelets 155 150 - 400 K/uL   nRBC 0.0 0.0 - 0.2 %    Studies/Results: CT ABDOMEN PELVIS WO CONTRAST  Result Date: 02/26/2019 CLINICAL DATA:  Acute generalized abdominal pain, nausea, vomiting. EXAM: CT ABDOMEN AND PELVIS WITHOUT CONTRAST TECHNIQUE: Multidetector CT imaging of the abdomen and pelvis was performed following the standard protocol without IV contrast. COMPARISON:  November 30, 2018. FINDINGS: Lower chest: Small pericardial effusion. Visualized lung bases are unremarkable. Hepatobiliary: No focal liver abnormality is seen. Status post cholecystectomy. No biliary dilatation. Pancreas: Unremarkable. No pancreatic ductal dilatation or surrounding inflammatory changes. Spleen: Normal in size without focal abnormality. Adrenals/Urinary Tract: Adrenal glands are unremarkable. Severe right renal atrophy is noted consistent with end-stage renal disease. Status post left nephrectomy. No hydronephrosis or renal obstruction is noted. Renal transplant is noted in right lower quadrant without hydronephrosis. Urinary bladder is unremarkable. Stomach/Bowel: The stomach appears normal. There is no evidence of bowel obstruction or inflammation. The appendix is not visualized.  Vascular/Lymphatic: Aortic atherosclerosis. No enlarged abdominal or pelvic lymph nodes. Reproductive: Status post hysterectomy. No adnexal masses. Other: No abdominal wall hernia or abnormality. No abdominopelvic ascites. 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 No acute osseous abnormality is noted. IMPRESSION: 1. Small pericardial effusion. 2. Severe right renal atrophy consistent with end-stage renal disease. 3. Status post left nephrectomy and right lower quadrant renal transplant. 4. 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 5. Aortic atherosclerosis. Aortic Atherosclerosis (ICD10-I70.0). Electronically Signed   By: Marijo Conception M.D.   On: 02/26/2019 14:28   US Renal Transplant w/Doppler  Result Date: 01/31/2019 CLINICAL DATA:  Acute  kidney injury, renal transplant recipient in 2010 EXAM: ULTRASOUND OF RENAL TRANSPLANT WITH RENAL DOPPLER ULTRASOUND TECHNIQUE: Ultrasound examination of the renal transplant was performed with gray-scale, color and duplex doppler evaluation. COMPARISON:  CT renal colic 28/36/6294 FINDINGS: Transplant kidney location: RLQ Transplant Kidney: Renal measurements: 11.3 x 5.1 x 4.2 cm = volume: 125.26m. Normal in size and parenchymal echogenicity. No evidence of mass. Mild calyceal dilatation resolved postvoiding, likely physiologic. No peri-transplant fluid collection seen. Color flow in the main renal artery:  Yes Color flow in the main renal vein:  Yes Duplex Doppler Evaluation: Main Renal Artery Resistive Index: 0.8 Venous waveform in main renal vein:  Present Intrarenal resistive index in upper pole:  0.7 (normal 0.6-0.8; equivocal 0.8-0.9; abnormal >= 0.9) Intrarenal resistive index in lower pole: 0.7 (normal 0.6-0.8; equivocal 0.8-0.9; abnormal >= 0.9) Bladder: Normal for degree of bladder distention. Other findings:  None. IMPRESSION: Resistive index in the main renal artery is borderline elevated at 0.81. This is  a nonspecific finding in the absence of comparison Doppler evaluation. Such findings could be considered within the clinical context. Mild caliectasis in the transplant resolved with voiding. Likely physiologic. No other abnormality is seen. Electronically Signed   By: PLovena LeM.D.   On: 01/31/2019 17:43   DG Chest Port 1 View  Result Date: 02/26/2019 CLINICAL DATA:  Weakness. EXAM: PORTABLE CHEST 1 VIEW COMPARISON:  September 22, 2018. FINDINGS: Stable cardiomegaly. No pneumothorax or pleural effusion is noted. Both lungs are clear. The visualized skeletal structures are unremarkable. IMPRESSION: No active disease. Electronically Signed   By: JMarijo ConceptionM.D.   On: 02/26/2019 16:58    Scheduled Meds: . atorvastatin  40 mg Oral QHS  . calcitRIOL  0.25 mcg Oral Daily  . calcium carbonate  1,000 mg Oral BID  . cholecalciferol  1,000 Units Oral Daily  . citalopram  20 mg Oral Daily  . donepezil  5 mg Oral QHS  . feeding supplement  1 Container Oral TID BM  . folic acid  1 mg Oral BID  . heparin  5,000 Units Subcutaneous Q8H  . metoprolol tartrate  25 mg Oral BID  . [START ON 02/28/2019] multivitamin with minerals  1 tablet Oral Daily  . mycophenolate  180 mg Oral BID  . sirolimus  3 mg Oral Daily  . sodium chloride flush  3 mL Intravenous Once  . traZODone  50 mg Oral QHS   Continuous Infusions: . cefTRIAXone (ROCEPHIN)  IV 1 g (02/27/19 0958)  . sodium bicarbonate 150 mEq in dextrose 5% 1000 mL     PRN Meds:acetaminophen, ondansetron **OR** ondansetron (ZOFRAN) IV  Assessment/Plan:  Acute renal failure superimposed on stage 4 chronic kidney disease ([place]: Unchanged from admission Likely prerenal with severe dehydration, poor p.o. intake. - Cont Hold Lasix.  -  Check UA --> Suspected UTI --> Emperic Abx started on 02/27/19 -  Monitor renal function closely.  Avoid nephrotoxins.   - Cont sodium bicarbonate infusion - Nephrology following. Appreciate Recs  Continue calcitriol,  calcium carbonate and vitamin D3.  Suspected UTI: Based on UA; appears to be having many bacteria -Also has leukocytosis -We will start empiric Rocephin daily from 02/27/2019 -Follow culture  Active Problems: Leukocytosis: Improving -Likely from UTI - Could be due to severe dehydration.  Monitor   -Continue antibiotic  Failure to thrive in adult PT and nutrition consult; continue diet per nutrition recommendation    Essential hypertension Stable.  Continue beta-blocker    H/O kidney  transplant Continue mycophenolate and sirolimus. -Serial Lemus level to follow per nephrology    Dementia, severe (HCC) Continue Aricept.  Anemia of chronic kidney disease Stable -To monitor   LOS: 1 day   [employer]

## 2019-02-27 NOTE — Progress Notes (Signed)
Initial Nutrition Assessment  DOCUMENTATION CODES:   Obesity unspecified  INTERVENTION:   Boost Breeze po TID, each supplement provides 250 kcal and 9 grams of protein  MVI daily  Pt likely at high refeed risk; recommend monitor K, Mg and P labs as oral intake improves.   NUTRITION DIAGNOSIS:   Inadequate oral intake related to acute illness as evidenced by per patient/family report.  GOAL:   Patient will meet greater than or equal to 90% of their needs  MONITOR:   PO intake, Supplement acceptance, Labs, Weight trends, Skin, I & O's  REASON FOR ASSESSMENT:   Consult Assessment of nutrition requirement/status  ASSESSMENT:   69 y.o. female, with history of dementia, chronic kidney disease stage IV, history of renal transplant in 2019 on immunosuppression, anemia of chronic disease, secondary hyperparathyroidism, essential hypertension, history of C. difficile colitis who was hospitalized 3 weeks back for acute on chronic kidney disease stage IV, she was discharged to SNF and returned home 1 week ago and has not been eating since returning home.   Unable to speak with pt r/t dementia/confusion. Pt is known to nutrition department from previous admits. Pt is generally a fairly good eater while in hospital. Pt lives with her daughter who prepares her meals. RD unsure whether or not pt was eating at SNF but per family report, pt has not been eating well since she arrived home. Per daughter report, pt is lactose intolerant so she drinks Ensure clear at home. Although, most supplements are lactose free, pt still refuses to drink them. RD will add supplements and MVI to help pt meet her estimated needs. Per chart, pt is fairly weight stable at baseline.   Medications reviewed and include: calcitriol, D3, celexa, folic acid, heparin, NaCl @125ml /hr, ceftriaxone   Labs reviewed: K 5.0 wnl, BUN 57(H), creat 3.56(H) Wbc- 12.6(H), Hgb 8.4(L), Hct 28.7(L), MCH 24.2(L), MCHC  29.3(L)  NUTRITION - FOCUSED PHYSICAL EXAM:    Most Recent Value  Orbital Region  No depletion  Upper Arm Region  No depletion  Thoracic and Lumbar Region  No depletion  Buccal Region  No depletion  Temple Region  No depletion  Clavicle Bone Region  No depletion  Clavicle and Acromion Bone Region  No depletion  Scapular Bone Region  No depletion  Dorsal Hand  No depletion  Patellar Region  No depletion  Anterior Thigh Region  No depletion  Posterior Calf Region  No depletion  Edema (RD Assessment)  None  Hair  Reviewed  Eyes  Reviewed  Mouth  Reviewed  Skin  Reviewed  Nails  Reviewed     Diet Order:   Diet Order            Diet regular Room service appropriate? Yes; Fluid consistency: Thin  Diet effective now             EDUCATION NEEDS:   Not appropriate for education at this time  Skin:  Skin Assessment: Reviewed RN Assessment  Last BM:  pta  Height:   Ht Readings from Last 1 Encounters:  02/26/19 4\' 11"  (1.499 m)    Weight:   Wt Readings from Last 1 Encounters:  02/26/19 90.7 kg    Ideal Body Weight:  44.5 kg  BMI:  Body mass index is 40.4 kg/m.  Estimated Nutritional Needs:   Kcal:  1700-1900kcal/day  Protein:  75-85g/day  Fluid:  >1.3L/day  Koleen Distance MS, RD, LDN Pager #- [phone] Office#- [phone] After Hours Pager: [phone]

## 2019-02-27 NOTE — Progress Notes (Signed)
[facility]  ROUNDING NOTE   Subjective:   [name] admitted to [facility] on 02/26/2019 for Dehydration [E86.0] Leucocytosis [D72.829] Acute-on-chronic kidney injury ([place]) [N17.9, N18.9] Kidney transplant recipient [Z94.0] Acute renal failure superimposed on stage 3b chronic kidney disease, unspecified acute renal failure type ([place]) [N17.9, N18.32]  Creatinine 3.56 Started on IV normal saline infusion with no improvement in kidney function  Pleasant dementia - unable to give history.   Recent hospitalization with prerenal azotemia.   Objective:  Vital signs in last 24 hours:  Temp:  [97.4 F (36.3 C)-97.9 F (36.6 C)] 97.9 F (36.6 C) (01/13 0628) Pulse Rate:  [29-88] 79 (01/13 0628) Resp:  [13-22] 20 (01/13 0628) BP: (89-143)/(47-96) 123/64 (01/13 0628) SpO2:  [92 %-100 %] 98 % (01/13 0628) Weight:  [90.7 kg] 90.7 kg (01/12 1222)  Weight change:  Filed Weights   02/26/19 1222  Weight: 90.7 kg    Intake/Output: I/O last 3 completed shifts: In: -  Out: 50 [Urine:50]   Intake/Output this shift:  No intake/output data recorded.  Physical Exam: General: NAD, Laying in bed  Head: Normocephalic, atraumatic. Moist oral mucosal membranes  Eyes: Anicteric, PERRL  Neck: Supple, trachea midline  Lungs:  Clear to auscultation  Heart: Regular rate and rhythm  Abdomen:  Soft, nontender,   Extremities:  no peripheral edema.  Neurologic: Alert to self only.   Skin: No lesions        Basic Metabolic Panel: Recent Labs  Lab 02/26/19 1242 02/27/19 0819  NA 143 145  K 4.3 5.0  CL 106 116*  CO2 21* 18*  GLUCOSE 174* 103*  BUN 52* 57*  CREATININE 3.56* 3.56*  CALCIUM 9.0 8.0*    Liver Function Tests: Recent Labs  Lab 02/26/19 1242  AST 30  ALT 17  ALKPHOS 90  BILITOT 1.0  PROT 8.3*  ALBUMIN 2.9*   Recent Labs  Lab 02/26/19 1242  LIPASE 18   No results for input(s): AMMONIA in the last 168 hours.  CBC: Recent Labs  Lab  02/26/19 1242 02/27/19 0835  WBC 17.8* 12.6*  HGB 9.9* 8.4*  HCT 33.5* 28.7*  MCV 81.5 82.7  PLT 217 155    Cardiac Enzymes: No results for input(s): CKTOTAL, CKMB, CKMBINDEX, TROPONINI in the last 168 hours.  BNP: Invalid input(s): POCBNP  CBG: No results for input(s): GLUCAP in the last 168 hours.  Microbiology: Results for orders placed or performed during the hospital encounter of 02/26/19  SARS CORONAVIRUS 2 (TAT 6-24 HRS) Nasopharyngeal Nasopharyngeal Swab     Status: None   Collection Time: 02/26/19  2:11 PM   Specimen: Nasopharyngeal Swab  Result Value Ref Range Status   SARS Coronavirus 2 NEGATIVE NEGATIVE Final    Comment: (NOTE) SARS-CoV-2 target nucleic acids are NOT DETECTED. The SARS-CoV-2 RNA is generally detectable in upper and lower respiratory specimens during the acute phase of infection. Negative results do not preclude SARS-CoV-2 infection, do not rule out co-infections with other pathogens, and should not be used as the sole basis for treatment or other patient management decisions. Negative results must be combined with clinical observations, patient history, and epidemiological information. The expected result is Negative. Fact Sheet for Patients: SugarRoll.be Fact Sheet for Healthcare Providers: https://www.woods-mathews.com/ This test is not yet approved or cleared by the [place] FDA and  has been authorized for detection and/or diagnosis of SARS-CoV-2 by FDA under an Emergency Use Authorization (EUA). This EUA will remain  in effect (meaning this test  can be used) for the duration of the COVID-19 declaration under Section 56 4(b)(1) of the Act, 21 U.S.C. section 360bbb-3(b)(1), unless the authorization is terminated or revoked sooner. Performed at [facility], [place] [address] [place], [place] [address]   Urine culture     Status: None (Preliminary result)   Collection Time: 02/26/19  10:32 PM   Specimen: Urine, Clean Catch  Result Value Ref Range Status   Specimen Description   Final    URINE, CLEAN CATCH Performed at [name], [address] [place], [place] [address]    Special Requests   Final    NONE Performed at [facility], [place] [address] [place], [place] [address]    Culture PENDING  Incomplete   Report Status PENDING  Incomplete    Coagulation Studies: No results for input(s): LABPROT, INR in the last 72 hours.  Urinalysis: Recent Labs    02/26/19 2232  COLORURINE YELLOW*  LABSPEC 1.012  PHURINE 5.0  GLUCOSEU NEGATIVE  HGBUR MODERATE*  BILIRUBINUR NEGATIVE  KETONESUR NEGATIVE  PROTEINUR 100*  NITRITE NEGATIVE  LEUKOCYTESUR LARGE*      Imaging: CT ABDOMEN PELVIS WO CONTRAST  Result Date: 02/26/2019 CLINICAL DATA:  Acute generalized abdominal pain, nausea, vomiting. EXAM: CT ABDOMEN AND PELVIS WITHOUT CONTRAST TECHNIQUE: Multidetector CT imaging of the abdomen and pelvis was performed following the standard protocol without IV contrast. COMPARISON:  November 30, 2018. FINDINGS: Lower chest: Small pericardial effusion. Visualized lung bases are unremarkable. Hepatobiliary: No focal liver abnormality is seen. Status post cholecystectomy. No biliary dilatation. Pancreas: Unremarkable. No pancreatic ductal dilatation or surrounding inflammatory changes. Spleen: Normal in size without focal abnormality. Adrenals/Urinary Tract: Adrenal glands are unremarkable. Severe right renal atrophy is noted consistent with end-stage renal disease. Status post left nephrectomy. No hydronephrosis or renal obstruction is noted. Renal transplant is noted in right lower quadrant without hydronephrosis. Urinary bladder is unremarkable. Stomach/Bowel: The stomach appears normal. There is no evidence of bowel obstruction or inflammation. The appendix is not visualized. Vascular/Lymphatic: Aortic atherosclerosis. No enlarged abdominal or pelvic lymph nodes.  Reproductive: Status post hysterectomy. No adnexal masses. Other: No abdominal wall hernia or abnormality. No abdominopelvic ascites. 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 No acute osseous abnormality is noted. IMPRESSION: 1. Small pericardial effusion. 2. Severe right renal atrophy consistent with end-stage renal disease. 3. Status post left nephrectomy and right lower quadrant renal transplant. 4. 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 5. Aortic atherosclerosis. Aortic Atherosclerosis (ICD10-I70.0). Electronically Signed   By: Marijo Conception M.D.   On: 02/26/2019 14:28   DG Chest Port 1 View  Result Date: 02/26/2019 CLINICAL DATA:  Weakness. EXAM: PORTABLE CHEST 1 VIEW COMPARISON:  September 22, 2018. FINDINGS: Stable cardiomegaly. No pneumothorax or pleural effusion is noted. Both lungs are clear. The visualized skeletal structures are unremarkable. IMPRESSION: No active disease. Electronically Signed   By: Marijo Conception M.D.   On: 02/26/2019 16:58     Medications:   . sodium chloride 125 mL/hr at 02/27/19 0229  . cefTRIAXone (ROCEPHIN)  IV 1 g (02/27/19 0958)   . atorvastatin  40 mg Oral QHS  . calcitRIOL  0.25 mcg Oral Daily  . calcium carbonate  1,000 mg Oral BID  . cholecalciferol  1,000 Units Oral Daily  . citalopram  20 mg Oral Daily  . donepezil  5 mg Oral QHS  . feeding supplement  1 Container Oral TID BM  .  folic acid  1 mg Oral BID  . heparin  5,000 Units Subcutaneous Q8H  . metoprolol tartrate  25 mg Oral BID  . [START ON 02/28/2019] multivitamin with minerals  1 tablet Oral Daily  . mycophenolate  180 mg Oral BID  . sirolimus  3 mg Oral Daily  . sodium chloride flush  3 mL Intravenous Once  . traZODone  50 mg Oral QHS   acetaminophen, ondansetron **OR** ondansetron (ZOFRAN) IV  Assessment/ Plan:  [name] is a 69 y.o. black female with renal transplant 2009 on sirolimus, mycophenolate, prednisone,  hypertension, history of renal cell carcinoma status post left nephrectomy.   1. Acute renal failure with metabolic acidosis on chronic kidney disease stage IV - T Baseline creatinine 1.49, GFR of 49 on 12/22.  Continue sirolimus, mycophenolate  Acute renal failure from poor PO intake - Change fluids to sodium bicarbonate infsuion - Check sirolimus trough level.  - Not a candidate to resume dialysis at this time.   2. Hypertension: 123/64 - metoprolol  3. Anemia with chronic kidney disease: hemoglobin 8.4. Normocytic - Consider ESA  5. Secondary Hyperparathyroidism - calcitriol - Check PTH   LOS: 1 [name] 1/13/202111:40 AM

## 2019-02-27 NOTE — Evaluation (Signed)
Physical Therapy Evaluation Patient Details Name: [name] MRN: [id number] DOB: 1951-01-21 Today's Date: 02/27/2019   History of Present Illness  Per MD H&P: Pt is a 69 y.o. female, with history of chronic kidney disease stage IV, history of renal transplant in 2019 on immunosuppression, anemia of chronic disease, secondary hyperparathyroidism, essential hypertension, history of C. difficile colitis who was hospitalized 3 weeks back for acute on chronic kidney disease stage IV thought to be prerenal with dehydration and improved with IV fluids.  She was discharged to SNF from where she returned home 1 week back. As per daughter patient has not been eating or drinking after returning from SNF, and has been progressively weak.  MD assessment includes: Acute renal failure superimposed on stage 4 chronic kidney disease, leukocytosis, HTN, FTT, anemia of chronic disease, and severe dementia.    Clinical Impression  Pt presented with deficits in strength, transfers, mobility, gait, balance, and activity tolerance.  Pt required extensive +2 assistance for all mobility tasks and was unable to advance either LE while in standing.  Pt required extensive multi- modal cues to participate with tasks and even then rarely followed commands.  Per chart review pt recently able to amb with a SPC with pt now presenting with significant functional deficits compared to baseline.  Pt will benefit from PT services in a SNF setting upon discharge to safely address above deficits for decreased caregiver assistance and eventual return to PLOF.      Follow Up Recommendations SNF    Equipment Recommendations  None recommended by PT    Recommendations for Other Services       Precautions / Restrictions Precautions Precautions: Fall Restrictions Weight Bearing Restrictions: No      Mobility  Bed Mobility Overal bed mobility: Needs Assistance Bed Mobility: Supine to Sit;Sit to Supine     Supine to sit: +2  for physical assistance;Max assist Sit to supine: +2 for physical assistance;Max assist   General bed mobility comments: Pt near total assist for bed mobility tasks with very little participation noted  Transfers Overall transfer level: Needs assistance Equipment used: Rolling walker (2 wheeled) Transfers: Sit to/from Stand Sit to Stand: +2 physical assistance;Max assist         General transfer comment: +2 Max A to stand and to prevent posterior LOB once in standing  Ambulation/Gait             General Gait Details: unable to advance either LE  Stairs            Wheelchair Mobility    Modified Rankin (Stroke Patients Only)       Balance Overall balance assessment: Needs assistance Sitting-balance support: Feet unsupported;Bilateral upper extremity supported Sitting balance-Leahy Scale: Fair     Standing balance support: Bilateral upper extremity supported Standing balance-Leahy Scale: Poor Standing balance comment: +2 extensive assistance to remain in standing with a RW                             Pertinent Vitals/Pain Pain Assessment: No/denies pain    Home Living Family/patient expects to be discharged to:: Private residence Living Arrangements: Children Available Help at Discharge: Family Type of Home: House         Home Equipment: Gilford Rile - 2 wheels;Cane - single point Additional Comments: Pt lives with her daughter and uses a [facility] for home ambulation. Pt unable to provide history and unable to reach daughter by phone with history  per chart review.    Prior Function Level of Independence: Needs assistance   Gait / Transfers Assistance Needed: SPC for home ambulation  ADL's / Homemaking Assistance Needed: Daughter assists with ADLs        Hand Dominance   Dominant Hand: Right    Extremity/Trunk Assessment   Upper Extremity Assessment Upper Extremity Assessment: Generalized weakness;Difficult to assess due to impaired  cognition    Lower Extremity Assessment Lower Extremity Assessment: Generalized weakness;Difficult to assess due to impaired cognition       Communication      Cognition Arousal/Alertness: Awake/alert Behavior During Therapy: Flat affect Overall Cognitive Status: No family/caregiver present to determine baseline cognitive functioning                                 General Comments: Pt A&O to self only, unable to state names of her children      General Comments      Exercises Total Joint Exercises Ankle Circles/Pumps: AAROM;Both;10 reps;15 reps Short Arc Quad: AAROM;Both;10 reps;15 reps Heel Slides: AAROM;Both;10 reps Hip ABduction/ADduction: AAROM;Both;10 reps;15 reps Straight Leg Raises: AAROM;Both;10 reps;15 reps Long Arc Quad: AAROM;Both;10 reps;15 reps Other Exercises Other Exercises: Static sitting at EOB x 8 min for core therex Other Exercises: Anterior weight shifting in sitting to promote proper sequencing for transfers   Assessment/Plan    PT Assessment Patient needs continued PT services  PT Problem List Decreased strength;Decreased mobility;Decreased activity tolerance;Decreased balance;Decreased knowledge of use of DME       PT Treatment Interventions DME instruction;Therapeutic activities;Gait training;Therapeutic exercise;Patient/family education;Balance training;Functional mobility training;Stair training    PT Goals (Current goals can be found in the Care Plan section)  Acute Rehab PT Goals PT Goal Formulation: Patient unable to participate in goal setting Time For Goal Achievement: 03/12/19 Potential to Achieve Goals: Fair    Frequency Min 2X/week   Barriers to discharge Inaccessible home environment      Co-evaluation               AM-PAC PT "6 Clicks" Mobility  Outcome Measure Help needed turning from your back to your side while in a flat bed without using bedrails?: Total Help needed moving from lying on your back  to sitting on the side of a flat bed without using bedrails?: Total Help needed moving to and from a bed to a chair (including a wheelchair)?: Total Help needed standing up from a chair using your arms (e.g., wheelchair or bedside chair)?: Total Help needed to walk in hospital room?: Total Help needed climbing 3-5 steps with a railing? : Total 6 Click Score: 6    End of Session Equipment Utilized During Treatment: Gait belt Activity Tolerance: Patient tolerated treatment well Patient left: in bed;with call bell/phone within reach;with bed alarm set Nurse Communication: Mobility status PT Visit Diagnosis: Unsteadiness on feet (R26.81);Muscle weakness (generalized) (M62.81);Difficulty in walking, not elsewhere classified (R26.2)    Time: 0762-2633 PT Time Calculation (min) (ACUTE ONLY): 31 min   Charges:   PT Evaluation $PT Eval Moderate Complexity: 1 Mod PT Treatments $Therapeutic Exercise: 8-22 mins       D. Scott Charlett Merkle PT, DPT 02/27/19, 11:23 AM

## 2019-02-27 NOTE — TOC Initial Note (Signed)
Transition of Care [facility]) - Initial/Assessment Note    Patient Details  Name: [name] MRN: [id number] Date of Birth: 07-May-1950  [facility] CM/SW Contact:    Shelbie Ammons, RN Phone Number: 02/27/2019, 2:15 PM  Clinical Narrative:  RNCM placed call to daughter for assessment due to patient's cognitive status. Patient is brought into hospital due to decreased PO intake and diagnosed with failure to thrive. Patient lives in home with daughter and transportation is handled through [employer]. Daughter reports that patient just came home from [facility] last week and she was scheduled to go for follow up with PCP tomorrow which of course now will be missed. Did discuss that PT has seen patient and has recommended SNF again however daughter reports that she wants to take her mom back home with the home health services that have already been initiated.  RNCM contacted [facility], Corene Cornea and they are open to them.  RNCM will continue to follow for any needs.           Expected Discharge Plan: [place] Barriers to Discharge: Continued Medical Work up   Patient Goals and CMS Choice Patient states their goals for this hospitalization and ongoing recovery are:: Per daughter for her to get to feeling better so she can come back home.      Expected Discharge Plan and Services Expected Discharge Plan: [place]   Discharge Planning Services: CM Consult Post Acute Care Choice: [address] arrangements for the past 2 months: [place]: [place] ([place]) Date [place]: 02/27/19 Time [place]: 15 Representative spoke with at [place]: [place] Arrangements/Services Living arrangements for the past 2 months: [place] Lives with:: Adult Children Patient language and need for interpreter reviewed:: Yes Do you feel safe going back to the  place where you live?: Yes      Need for Family Participation in Patient Care: Yes (Comment) Care giver support system in place?: Yes (comment) Current home services: Homehealth aide, Home OT, Home PT, Home RN Criminal Activity/Legal Involvement Pertinent to Current Situation/Hospitalization: No - Comment as needed  Activities of Daily Living Home Assistive Devices/Equipment: Environmental consultant (specify type), Wheelchair ADL Screening (condition at time of admission) Patient's cognitive ability adequate to safely complete daily activities?: No Is the patient deaf or have difficulty hearing?: No Does the patient have difficulty seeing, even when wearing glasses/contacts?: No Does the patient have difficulty concentrating, remembering, or making decisions?: Yes Patient able to express need for assistance with ADLs?: Yes Does the patient have difficulty dressing or bathing?: Yes Independently performs ADLs?: No Communication: Independent Dressing (OT): Needs assistance Is this a change from baseline?: Pre-admission baseline Grooming: Needs assistance Does the patient have difficulty walking or climbing stairs?: Yes Weakness of Legs: Both Weakness of Arms/Hands: Both  Permission Sought/Granted                  Emotional Assessment Appearance:: (Daughter assessed by phone due to patient cognitive status) Attitude/Demeanor/Rapport: Engaged     Alcohol / Substance Use: Never Used Psych Involvement: No (comment)  Admission diagnosis:  Dehydration [E86.0] Leucocytosis [D72.829] Acute-on-chronic kidney injury ([place]) [N17.9, N18.9] Kidney transplant recipient [Z94.0] Acute renal failure superimposed on stage 3b chronic kidney disease, unspecified acute renal  failure type ([place]) [N17.9, N18.32] Patient Active Problem List   Diagnosis Date Noted  . Dehydration   . Acute-on-chronic kidney injury ([place]) 02/26/2019  . FTT (failure to thrive) in adult 02/26/2019  . Pressure injury of skin  02/04/2019  . AKI (acute kidney injury) ([place]) 02/01/2019  . Shaking 01/31/2019  . Hyperkalemia 01/31/2019  . Acute renal failure superimposed on stage 4 chronic kidney disease ([place]) 01/31/2019  . HLD (hyperlipidemia) 01/31/2019  . Acute lower UTI 11/07/2018  . C. difficile colitis 11/07/2018  . Acute encephalopathy 09/22/2018  . Renal failure (ARF), acute on chronic ([place]) 08/12/2018  . Confusion 03/23/2018  . Hypokalemia 06/01/2017  . Sepsis ([place]) 06/29/2016  . UTI (urinary tract infection) 06/29/2016  . GERD (gastroesophageal reflux disease) 06/29/2016  . Dementia ([place]) 06/29/2016  . 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 Essential hypertension 07/06/2014  . H/O kidney transplant 07/06/2014   PCP:  Baxter Hire, MD Pharmacy:   [facility] [place], [place] [address] Phone: [phone] Fax: [phone]  [place], [place] [address] Phone: [phone] Fax: [phone]     Social Determinants of Health (SDOH) Interventions    Readmission Risk Interventions Readmission Risk Prevention Plan 02/27/2019 12/02/2018 12/01/2018  Transportation Screening Complete Complete Complete  PCP or Specialist Appt within 3-5 Days - - -  [place] or [place] - - -  Palliative Care Screening - - -  Medication Review ([place]) Complete Complete -  PCP or Specialist appointment within 3-5 days of discharge Complete Complete -  [place] or Home Care Consult Complete Complete Complete  SW Recovery Care/Counseling Consult - Patient refused -  Palliative Care Screening Not Applicable Not Applicable -  [place] Patient Refused Not Applicable Patient Refused  Some recent data might be hidden

## 2019-02-28 DIAGNOSIS — N390 Urinary tract infection, site not specified: Secondary | ICD-10-CM

## 2019-02-28 LAB — RENAL FUNCTION PANEL
Albumin: 2.3 g/dL — ABNORMAL LOW (ref 3.5–5.0)
Anion gap: 12 (ref 5–15)
BUN: 60 mg/dL — ABNORMAL HIGH (ref 8–23)
CO2: 26 mmol/L (ref 22–32)
Calcium: 7.7 mg/dL — ABNORMAL LOW (ref 8.9–10.3)
Chloride: 108 mmol/L (ref 98–111)
Creatinine, Ser: 4.15 mg/dL — ABNORMAL HIGH (ref 0.44–1.00)
GFR calc Af Amer: 12 mL/min — ABNORMAL LOW (ref >60)
GFR calc non Af Amer: 10 mL/min — ABNORMAL LOW (ref >60)
Glucose, Bld: 137 mg/dL — ABNORMAL HIGH (ref 70–99)
Phosphorus: 2.9 mg/dL (ref 2.5–4.6)
Potassium: 3.8 mmol/L (ref 3.5–5.1)
Sodium: 146 mmol/L — ABNORMAL HIGH (ref 135–145)

## 2019-02-28 LAB — CBC
HCT: 25.9 % — ABNORMAL LOW (ref 36.0–46.0)
Hemoglobin: 7.7 g/dL — ABNORMAL LOW (ref 12.0–15.0)
MCH: 23.8 pg — ABNORMAL LOW (ref 26.0–34.0)
MCHC: 29.7 g/dL — ABNORMAL LOW (ref 30.0–36.0)
MCV: 79.9 fL — ABNORMAL LOW (ref 80.0–100.0)
Platelets: 167 10*3/uL (ref 150–400)
RBC: 3.24 MIL/uL — ABNORMAL LOW (ref 3.87–5.11)
RDW: 14.8 % (ref 11.5–15.5)
WBC: 11.2 10*3/uL — ABNORMAL HIGH (ref 4.0–10.5)
nRBC: 0 % (ref 0.0–0.2)

## 2019-02-28 LAB — URINE CULTURE: Culture: >=100000 — AB

## 2019-02-28 MED ORDER — SODIUM CHLORIDE 0.9 % IV SOLN
INTRAVENOUS | Status: DC | PRN
  Administered 2019-02-28 – 2019-03-01 (×3): 30 mL via INTRAVENOUS

## 2019-02-28 MED ORDER — ALBUMIN HUMAN 25 % IV SOLN
25.0000 g | Freq: Once | INTRAVENOUS | Status: AC
  Administered 2019-02-28: 25 g via INTRAVENOUS
  Filled 2019-02-28: qty 100

## 2019-02-28 MED ORDER — EPOETIN ALFA 10000 UNIT/ML IJ SOLN
10000.0000 [IU] | Freq: Once | INTRAMUSCULAR | Status: AC
  Administered 2019-03-01: 10000 [IU] via SUBCUTANEOUS
  Filled 2019-02-28: qty 1

## 2019-02-28 NOTE — Progress Notes (Signed)
Physical Therapy Treatment Patient Details Name: [name] MRN: [id number] DOB: Aug 26, 1950 Today's Date: 02/28/2019    History of Present Illness Per MD H&P: Pt is a 69 y.o. female, with history of chronic kidney disease stage IV, history of renal transplant in 2019 on immunosuppression, anemia of chronic disease, secondary hyperparathyroidism, essential hypertension, history of C. difficile colitis who was hospitalized 3 weeks back for acute on chronic kidney disease stage IV thought to be prerenal with dehydration and improved with IV fluids.  She was discharged to SNF from where she returned home 1 week back. As per daughter patient has not been eating or drinking after returning from SNF, and has been progressively weak.  MD assessment includes: Acute renal failure superimposed on stage 4 chronic kidney disease, leukocytosis, HTN, FTT, anemia of chronic disease, and severe dementia.    PT Comments    Pt presented with deficits in strength, transfers, mobility, gait, balance, and activity tolerance.  Pt required extensive physical assistance for all functional mobility tasks.  Pt presented with grossly improved static standing balance this session but continued to require some assistance to prevent LOB.  Pt was able to stand at the EOB x 3 min with max verbal cues to attempt to advance a LE but when the pt did attempt to lift a foot from the floor both knees buckled and she was assisted back to sitting at the EOB.  Pt will benefit from PT services in a SNF setting upon discharge to safely address above deficits for decreased caregiver assistance and eventual return to PLOF.     Follow Up Recommendations  SNF     Equipment Recommendations  None recommended by PT    Recommendations for Other Services       Precautions / Restrictions Precautions Precautions: Fall Restrictions Weight Bearing Restrictions: No    Mobility  Bed Mobility Overal bed mobility: Needs Assistance Bed  Mobility: Supine to Sit;Sit to Supine     Supine to sit: +2 for physical assistance;Max assist Sit to supine: +2 for physical assistance;Max assist   General bed mobility comments: Pt near total assist for bed mobility tasks with very little participation noted  Transfers Overall transfer level: Needs assistance Equipment used: Rolling walker (2 wheeled) Transfers: Sit to/from Stand Sit to Stand: +2 physical assistance;Mod assist         General transfer comment: Improved static standing balance grossly this session with BUEs placed on RW for patient  Ambulation/Gait             General Gait Details: Pt attempted to advance a LE this session with B knees buckling and pt assisted back to sitting at EOB   Stairs             Wheelchair Mobility    Modified Rankin (Stroke Patients Only)       Balance Overall balance assessment: Needs assistance Sitting-balance support: Feet unsupported;Bilateral upper extremity supported Sitting balance-Leahy Scale: Fair Sitting balance - Comments: Posterior instabiltiy initially upon coming to sitting at the EOB but pt able to self-correct with cuing Postural control: Posterior lean Standing balance support: Bilateral upper extremity supported Standing balance-Leahy Scale: Poor Standing balance comment: Min A for stability in standing                            Cognition Arousal/Alertness: Awake/alert Behavior During Therapy: WFL for tasks assessed/performed Overall Cognitive Status: No family/caregiver present to determine baseline cognitive functioning  Following Commands: Follows one step commands inconsistently              Exercises Total Joint Exercises Ankle Circles/Pumps: AAROM;Both;10 reps;15 reps Short Arc Quad: AAROM;Both;10 reps;15 reps Heel Slides: AAROM;Both;10 reps;5 reps Hip ABduction/ADduction: AAROM;Both;10 reps;15 reps Straight Leg Raises:  AAROM;Both;10 reps;15 reps Long Arc Quad: AAROM;Both;10 reps;15 reps Other Exercises Other Exercises: Static sitting at EOB x 8 min for core therex and sitting balance    General Comments        Pertinent Vitals/Pain Pain Assessment: No/denies pain    Home Living                      Prior Function            PT Goals (current goals can now be found in the care plan section) Progress towards PT goals: Progressing toward goals    Frequency    Min 2X/week      PT Plan Current plan remains appropriate    Co-evaluation              AM-PAC PT "6 Clicks" Mobility   Outcome Measure  Help needed turning from your back to your side while in a flat bed without using bedrails?: Total Help needed moving from lying on your back to sitting on the side of a flat bed without using bedrails?: Total Help needed moving to and from a bed to a chair (including a wheelchair)?: Total Help needed standing up from a chair using your arms (e.g., wheelchair or bedside chair)?: Total Help needed to walk in hospital room?: Total Help needed climbing 3-5 steps with a railing? : Total 6 Click Score: 6    End of Session Equipment Utilized During Treatment: Gait belt Activity Tolerance: Patient tolerated treatment well Patient left: in bed;with call bell/phone within reach;with bed alarm set Nurse Communication: Mobility status PT Visit Diagnosis: Unsteadiness on feet (R26.81);Muscle weakness (generalized) (M62.81);Difficulty in walking, not elsewhere classified (R26.2)     Time: 1735-6701 PT Time Calculation (min) (ACUTE ONLY): 25 min  Charges:  $Therapeutic Exercise: 8-22 mins $Therapeutic Activity: 8-22 mins                     D. Scott Pamalee Marcoe PT, DPT 02/28/19, 10:59 AM

## 2019-02-28 NOTE — Progress Notes (Signed)
Brief Hx: [name]  is a 69 y.o. female, with history of chronic kidney disease stage IV, history of renal transplant in 2019 on immunosuppression, anemia of chronic disease, secondary hyperparathyroidism, essential hypertension, history of C. difficile colitis who was hospitalized 3 weeks back for acute on chronic kidney disease stage IV thought to be prerenal with dehydration and improved with IV fluids.  She was discharged to SNF from where she returned home 1 week back. As per daughter patient has not been eating or drinking after returning from SNF, and has been progressively weak.  Course in the ED Vitals stable.  Blood work showed significant leukocytosis (17.8k), hemoglobin of 9.9, BUN of 52 and creatinine of 3.56 (baseline of 1.7).  Glucose of 174.  Albumin of 2.9, LFTs otherwise normal.  POC Covid 19 was negative, PCR pending.  Subjective: Patient was alert and try to answer some questions.  But not quite oriented to place and time.  Also developed a temperature of over 100 F.  Objective: Vital signs in last 24 hours: Temp:  [99.1 F (37.3 C)-101.2 F (38.4 C)] 100.3 F (37.9 C) (01/14 0631) Pulse Rate:  [72-85] 72 (01/14 1115) Resp:  [14-18] 18 (01/14 0534) BP: (91-113)/(52-63) 91/59 (01/14 1115) SpO2:  [97 %-100 %] 100 % (01/14 0534)  Intake/Output from previous day: 01/13 0701 - 01/14 0700 In: 3437.1 [P.O.:60; I.V.:3277.1] Out: -  Intake/Output this shift: No intake/output data recorded.   General: Elderly female lying in bed in no acute distress, alert but not oriented HEENT: Pupils reactive bilaterally, EOMI, pallor present, no icterus, dry oral mucosa, temporal wasting, supple neck, no cervical pain with Chest: Clear to auscultation bilaterally CVS: Normal S1-S2, no murmurs rub or gallop GI: Soft, nondistended, nontender, bowel sounds present Musculoskeletal: Warm, no edema CNS: Alert and awake, oriented x0, nonfocal  Results for orders placed or performed  during the hospital encounter of 02/26/19 (from the past 24 hour(s))  Renal function panel     Status: Abnormal   Collection Time: 02/28/19  3:15 AM  Result Value Ref Range   Sodium 146 (H) 135 - 145 mmol/L   Potassium 3.8 3.5 - 5.1 mmol/L   Chloride 108 98 - 111 mmol/L   CO2 26 22 - 32 mmol/L   Glucose, Bld 137 (H) 70 - 99 mg/dL   BUN 60 (H) 8 - 23 mg/dL   Creatinine, Ser 4.15 (H) 0.44 - 1.00 mg/dL   Calcium 7.7 (L) 8.9 - 10.3 mg/dL   Phosphorus 2.9 2.5 - 4.6 mg/dL   Albumin 2.3 (L) 3.5 - 5.0 g/dL   GFR calc non Af Amer 10 (L) >60 mL/min   GFR calc Af Amer 12 (L) >60 mL/min   Anion gap 12 5 - 15  CBC AM     Status: Abnormal   Collection Time: 02/28/19  3:15 AM  Result Value Ref Range   WBC 11.2 (H) 4.0 - 10.5 K/uL   RBC 3.24 (L) 3.87 - 5.11 MIL/uL   Hemoglobin 7.7 (L) 12.0 - 15.0 g/dL   HCT 25.9 (L) 36.0 - 46.0 %   MCV 79.9 (L) 80.0 - 100.0 fL   MCH 23.8 (L) 26.0 - 34.0 pg   MCHC 29.7 (L) 30.0 - 36.0 g/dL   RDW 14.8 11.5 - 15.5 %   Platelets 167 150 - 400 K/uL   nRBC 0.0 0.0 - 0.2 %    Studies/Results: CT ABDOMEN PELVIS WO CONTRAST  Result Date: 02/26/2019 CLINICAL DATA:  Acute generalized abdominal  pain, nausea, vomiting. EXAM: CT ABDOMEN AND PELVIS WITHOUT CONTRAST TECHNIQUE: Multidetector CT imaging of the abdomen and pelvis was performed following the standard protocol without IV contrast. COMPARISON:  November 30, 2018. FINDINGS: Lower chest: Small pericardial effusion. Visualized lung bases are unremarkable. Hepatobiliary: No focal liver abnormality is seen. Status post cholecystectomy. No biliary dilatation. Pancreas: Unremarkable. No pancreatic ductal dilatation or surrounding inflammatory changes. Spleen: Normal in size without focal abnormality. Adrenals/Urinary Tract: Adrenal glands are unremarkable. Severe right renal atrophy is noted consistent with end-stage renal disease. Status post left nephrectomy. No hydronephrosis or renal obstruction is noted. Renal transplant  is noted in right lower quadrant without hydronephrosis. Urinary bladder is unremarkable. Stomach/Bowel: The stomach appears normal. There is no evidence of bowel obstruction or inflammation. The appendix is not visualized. Vascular/Lymphatic: Aortic atherosclerosis. No enlarged abdominal or pelvic lymph nodes. Reproductive: Status post hysterectomy. No adnexal masses. Other: No abdominal wall hernia or abnormality. No abdominopelvic ascites. 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 No acute osseous abnormality is noted. IMPRESSION: 1. Small pericardial effusion. 2. Severe right renal atrophy consistent with end-stage renal disease. 3. Status post left nephrectomy and right lower quadrant renal transplant. 4. 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 5. Aortic atherosclerosis. Aortic Atherosclerosis (ICD10-I70.0). Electronically Signed   By: Marijo Conception M.D.   On: 02/26/2019 14:28   US Renal Transplant w/Doppler  Result Date: 01/31/2019 CLINICAL DATA:  Acute kidney injury, renal transplant recipient in 2010 EXAM: ULTRASOUND OF RENAL TRANSPLANT WITH RENAL DOPPLER ULTRASOUND TECHNIQUE: Ultrasound examination of the renal transplant was performed with gray-scale, color and duplex doppler evaluation. COMPARISON:  CT renal colic 71/24/5809 FINDINGS: Transplant kidney location: RLQ Transplant Kidney: Renal measurements: 11.3 x 5.1 x 4.2 cm = volume: 125.21mL. Normal in size and parenchymal echogenicity. No evidence of mass. Mild calyceal dilatation resolved postvoiding, likely physiologic. No peri-transplant fluid collection seen. Color flow in the main renal artery:  Yes Color flow in the main renal vein:  Yes Duplex Doppler Evaluation: Main Renal Artery Resistive Index: 0.8 Venous waveform in main renal vein:  Present Intrarenal resistive index in upper pole:  0.7 (normal 0.6-0.8; equivocal 0.8-0.9; abnormal >= 0.9) Intrarenal resistive index in lower pole:  0.7 (normal 0.6-0.8; equivocal 0.8-0.9; abnormal >= 0.9) Bladder: Normal for degree of bladder distention. Other findings:  None. IMPRESSION: Resistive index in the main renal artery is borderline elevated at 0.81. This is a nonspecific finding in the absence of comparison Doppler evaluation. Such findings could be considered within the clinical context. Mild caliectasis in the transplant resolved with voiding. Likely physiologic. No other abnormality is seen. Electronically Signed   By: Lovena Le M.D.   On: 01/31/2019 17:43   DG Chest Port 1 View  Result Date: 02/26/2019 CLINICAL DATA:  Weakness. EXAM: PORTABLE CHEST 1 VIEW COMPARISON:  September 22, 2018. FINDINGS: Stable cardiomegaly. No pneumothorax or pleural effusion is noted. Both lungs are clear. The visualized skeletal structures are unremarkable. IMPRESSION: No active disease. Electronically Signed   By: Marijo Conception M.D.   On: 02/26/2019 16:58    Scheduled Meds: . atorvastatin  40 mg Oral QHS  . calcitRIOL  0.25 mcg Oral Daily  . calcium carbonate  1,000 mg Oral BID  . cholecalciferol  1,000 Units Oral Daily  . citalopram  20 mg Oral Daily  . donepezil  5 mg Oral QHS  . feeding supplement  1 Container Oral TID BM  . folic acid  1  mg Oral BID  . heparin  5,000 Units Subcutaneous Q8H  . metoprolol tartrate  25 mg Oral BID  . multivitamin with minerals  1 tablet Oral Daily  . mycophenolate  180 mg Oral BID  . sirolimus  3 mg Oral Daily  . sodium chloride flush  3 mL Intravenous Once  . traZODone  50 mg Oral QHS   Continuous Infusions: . sodium chloride 30 mL (02/28/19 1120)  . albumin human    . cefTRIAXone (ROCEPHIN)  IV 1 g (02/28/19 1125)  . sodium bicarbonate 150 mEq in dextrose 5% 1000 mL 150 mEq (02/28/19 0115)   PRN Meds:sodium chloride, acetaminophen, ondansetron **OR** ondansetron (ZOFRAN) IV  Assessment/Plan:  Acute renal failure superimposed on stage 4 chronic kidney disease ([place]): Unchanged from  admission Likely prerenal with severe dehydration, poor p.o. intake. - Cont to hold Lasix.  -Urine culture from 02/26/2019 growing Klebsiella; started antibiotic -  Monitor renal function closely.  Avoid nephrotoxins.   - Cont sodium bicarbonate infusion - Nephrology following. Appreciate Recs  Continue calcitriol, calcium carbonate and vitamin D3. -Continue sodium bicarb  UTI: Urine culture from 02/26/2019 growing Klebsiella -Leukocytosis resolving -Started on Rocephin from 02/27/2019; continue -Check blood culture  Active Problems: Leukocytosis: Improving -Likely from UTI - Could be due to severe dehydration as well.   - Monitor   -Continue antibiotic -We will obtain 2 sets of blood culture  Failure to thrive in adult PT and nutrition consult; continue diet per nutrition recommendation    Essential hypertension: -Currently running low in 90s -Has been on Lopressor 25 mg twice daily -May hold if needed -Continue volume expansion with albumin per nephrology    H/O kidney transplant Continue mycophenolate and sirolimus. -Serial Lemus level to follow per nephrology    Dementia, severe ([place]) Continue Aricept.  Anemia of chronic kidney disease Stable -May administer Epogen as per nephrology -To monitor   LOS: 2 days   Wadsworth Skolnick Izetta Dakin

## 2019-02-28 NOTE — Evaluation (Addendum)
Clinical/Bedside Swallow Evaluation Patient Details  Name: [name] MRN: [id number] Date of Birth: 19-Aug-1950  Today's Date: 02/28/2019 Time: SLP Start Time (ACUTE ONLY): 0815 SLP Stop Time (ACUTE ONLY): 0915 SLP Time Calculation (min) (ACUTE ONLY): 60 min  Past Medical History:  Past Medical History:  Diagnosis Date  . Anemia   . C. difficile colitis   . Cancer of kidney ([place])   . Dementia ([place])   . GERD (gastroesophageal reflux disease)   . Hypertension   . Renal disorder   . Uterine cancer [facility])    Past Surgical History:  Past Surgical History:  Procedure Laterality Date  . ABDOMINAL HYSTERECTOMY    . AV FISTULA PLACEMENT    . BREAST BIOPSY     Patient is unsure but she thinks she remember a Bx  . BREAST BIOPSY Right 2017   benign  . KIDNEY TRANSPLANT    . NEPHRECTOMY TRANSPLANTED ORGAN     HPI:  Pt is a 69 y.o. female, with history of advanced Dementia, GERD, depression, chronic kidney disease stage IV, history of renal transplant in 2019 on immunosuppression, anemia of chronic disease, secondary hyperparathyroidism, essential hypertension, history of C. difficile colitis who was hospitalized 3 weeks back for acute on chronic kidney disease stage IV thought to be prerenal with dehydration and improved with IV fluids.  She was discharged to SNF from where she returned home 1 week back. As per daughter patient has not been eating or drinking after returning from SNF, and has been progressively weak.  Patient complained of nausea but denied any fevers, chills, headache, complains of dizziness, chest pain, shortness of breath, abdominal pain,  vomiting, diarrhea, fall, cough.  No recent contact with anyone with COVID-19.  No documented weight loss. NSG has reported multiple episodes of oral phase deficits attempting to eat a Regular diet since admission(holding, pocketing).  Assessment / Plan / Recommendation Clinical Impression  Pt appears to present w/ oral phase  dysphagia impacted by Cognitive decline/Dementia; adequate pharyngeal phase swallowing function w/ reduced risk for aspiration when following general aspiration precautions and when provided Supervision w/, and to encourage, oral intake. Pt does have a baseline of significant Dementia/Cognitive decline for ~3 years per chart notes. She has often declined to eat/drink w/ NSG per chart notes at previous admission - this may have occurred at recent SNF placement. She is verbally responsive and quite pleasant but easily distracted and requires moderate cues for follow through. Noted intermittent facial responses/motor behaviors as responses w/ po trials. With FULL support for feeding/cues to self-feed, pt consumed po trials of thin liquids and purees w/ no overt s/s of aspiration; no decline in vocal quality or respiratory presentation during/post trials. Pt fed self holding cup w/ mod+ support. Oral phase c/b grossly adequate w/ bolus management and timely A-P transfer; however, intemrittent oral holding and delay in A-P transfer noted. SLP alternated foods/liquids to aid oral clearing; time also given. Of note, INCREASED oral munching, pocketing, and diffuse residue noted w/ 2 trials of Minced foods moistened were given. Much time was needed for pt to clear orally. This oral phase presentation is seen w/ Cognitive decline. Oral clearing checked at end of meal.  OM exam revealed No unilateral weakness during speech and bolus management. Pt is missing upper dentition, many of the the lower dentition. Recommend a Pureed diet (dys. 1) w/ Gravies to moisten; Thin liquids. General aspiration precautions recommended w/ Pills Crushed in puree for easier swallowing. Tray setup at meals and reduce  distractions - Supervision at meals. Recommend f/u w/ Dietician for support. Encourage family to learn more re: Dementia (and eating/drinking) via Palliative Care services. This PUREE diet consistency would be MOST BENEFICIAL for pt's  oral intake, safety, and ability to meet nutritional needs d/t her Advanced Dementia. Handouts left in room on diet consistency for family. Suspect new baseline.  SLP Visit Diagnosis: Dysphagia, oral phase (R13.11)(baseline Dementia)    Aspiration Risk  Mild aspiration risk;Risk for inadequate nutrition/hydration(reduced when following general precs)    Diet Recommendation  Dysphagia level 1 (PUREE w/ gravies added to moisten); Thin liquids. General aspiration precautions; Reflux precautions. Dietician f/u - recommend a Drink Supplement for ease of intake d/t Dementia, Cognitive decline.  Medication Administration: Crushed with puree(as needed for safer swallowing)    Other  Recommendations Recommended Consults: (dietician f/u; Palliative Care for [place]) Oral Care Recommendations: Oral care BID;Oral care before and after PO;Staff/trained caregiver to provide oral care Other Recommendations: (n/a)   Follow up Recommendations None - suspect new baseline     Frequency and Duration (n/a)  (n/a)       Prognosis Prognosis for Safe Diet Advancement: Fair Barriers to Reach Goals: Cognitive deficits;Time post onset;Severity of deficits;Behavior      Swallow Study   General Date of Onset: 02/26/19 HPI: Pt is a 69 y.o. female, with history of advanced Dementia, GERD, depression, chronic kidney disease stage IV, history of renal transplant in 2019 on immunosuppression, anemia of chronic disease, secondary hyperparathyroidism, essential hypertension, history of C. difficile colitis who was hospitalized 3 weeks back for acute on chronic kidney disease stage IV thought to be prerenal with dehydration and improved with IV fluids.  She was discharged to SNF from where she returned home 1 week back. As per daughter patient has not been eating or drinking after returning from SNF, and has been progressively weak.  Patient complained of nausea but denied any fevers, chills, headache, complains of dizziness,  chest pain, shortness of breath, abdominal pain,  vomiting, diarrhea, fall, cough.  No recent contact with anyone with COVID-19.  No documented weight loss.  Type of Study: Bedside Swallow Evaluation Previous Swallow Assessment: 07/2018; 01/2019 Diet Prior to this Study: Thin liquids(w/ regular diet recently; pureed diet in past) Temperature Spikes Noted: No(wbc 11.2 trending down) Respiratory Status: Room air History of Recent Intubation: No Behavior/Cognition: Alert;Cooperative;Pleasant mood;Confused;Distractible;Requires cueing Oral Cavity Assessment: Within Functional Limits Oral Care Completed by SLP: Yes Oral Cavity - Dentition: (not wearing Top dentures; few lower teeth) Vision: Functional for self-feeding Self-Feeding Abilities: Able to feed self;Needs assist;Needs set up;Total assist(needed support throughout meal to focus to task) Patient Positioning: Upright in bed(needed positioning) Baseline Vocal Quality: Normal Volitional Cough: Cognitively unable to elicit Volitional Swallow: Unable to elicit    Oral/Motor/Sensory Function Overall Oral Motor/Sensory Function: Within functional limits(appeared w/ bolus management; sticking out tongue)   Ice Chips Ice chips: Within functional limits Presentation: Spoon(fed; 2 trials)   Thin Liquid Thin Liquid: Within functional limits(grossly) Presentation: Cup;Straw;Self Fed(w/ support) Other Comments: noted behavioral movements during drinking tasks    Nectar Thick Nectar Thick Liquid: Not tested   Honey Thick Honey Thick Liquid: Not tested   Puree Puree: Impaired Presentation: Spoon(fed; ~12+ ozs) Oral Phase Impairments: Poor awareness of bolus Oral Phase Functional Implications: Prolonged oral transit Pharyngeal Phase Impairments: (none)   Solid     Solid: Impaired(broken down solids, moistened) Presentation: Spoon(fed; 2 trials) Oral Phase Impairments: Poor awareness of bolus;Impaired mastication Oral Phase Functional  Implications: Prolonged oral  transit;Impaired mastication;Oral residue;Oral holding Pharyngeal Phase Impairments: (none) Other Comments: alternated foods       Orinda Kenner, MS, CCC-SLP Lynett Brasil 02/28/2019,12:48 PM

## 2019-02-28 NOTE — Progress Notes (Signed)
[facility]  ROUNDING NOTE   Subjective:   Creatinine 4.15 (3.56)  UOP not recorded  Patient is pleasant but unable to give a history.   Objective:  Vital signs in last 24 hours:  Temp:  [99 F (37.2 C)-101.2 F (38.4 C)] 99 F (37.2 C) (01/14 1206) Pulse Rate:  [72-87] 87 (01/14 1206) Resp:  [14-18] 16 (01/14 1206) BP: (91-113)/(49-63) 102/49 (01/14 1206) SpO2:  [97 %-100 %] 99 % (01/14 1206)  Weight change:  Filed Weights   02/26/19 1222  Weight: 90.7 kg    Intake/Output: I/O last 3 completed shifts: In: 3437.1 [P.O.:60; I.V.:3277.1; IV Piggyback:100] Out: 50 [Urine:50]   Intake/Output this shift:  Total I/O In: 764.1 [I.V.:664.1; IV Piggyback:100] Out: -   Physical Exam: General: NAD, Laying in bed  Head: Normocephalic, atraumatic. Moist oral mucosal membranes  Eyes: Anicteric, PERRL  Neck: Supple, trachea midline  Lungs:  Clear to auscultation  Heart: Regular rate and rhythm  Abdomen:  Soft, nontender,   Extremities:  no peripheral edema.  Neurologic: Alert to self only.   Skin: No lesions        Basic Metabolic Panel: Recent Labs  Lab 02/26/19 1242 02/27/19 0819 02/28/19 0315  NA 143 145 146*  K 4.3 5.0 3.8  CL 106 116* 108  CO2 21* 18* 26  GLUCOSE 174* 103* 137*  BUN 52* 57* 60*  CREATININE 3.56* 3.56* 4.15*  CALCIUM 9.0 8.0* 7.7*  PHOS  --   --  2.9    Liver Function Tests: Recent Labs  Lab 02/26/19 1242 02/28/19 0315  AST 30  --   ALT 17  --   ALKPHOS 90  --   BILITOT 1.0  --   PROT 8.3*  --   ALBUMIN 2.9* 2.3*   Recent Labs  Lab 02/26/19 1242  LIPASE 18   No results for input(s): AMMONIA in the last 168 hours.  CBC: Recent Labs  Lab 02/26/19 1242 02/27/19 0835 02/28/19 0315  WBC 17.8* 12.6* 11.2*  HGB 9.9* 8.4* 7.7*  HCT 33.5* 28.7* 25.9*  MCV 81.5 82.7 79.9*  PLT 217 155 167    Cardiac Enzymes: No results for input(s): CKTOTAL, CKMB, CKMBINDEX, TROPONINI in the last 168  hours.  BNP: Invalid input(s): POCBNP  CBG: No results for input(s): GLUCAP in the last 168 hours.  Microbiology: Results for orders placed or performed during the hospital encounter of 02/26/19  SARS CORONAVIRUS 2 (TAT 6-24 HRS) Nasopharyngeal Nasopharyngeal Swab     Status: None   Collection Time: 02/26/19  2:11 PM   Specimen: Nasopharyngeal Swab  Result Value Ref Range Status   SARS Coronavirus 2 NEGATIVE NEGATIVE Final    Comment: (NOTE) SARS-CoV-2 target nucleic acids are NOT DETECTED. The SARS-CoV-2 RNA is generally detectable in upper and lower respiratory specimens during the acute phase of infection. Negative results do not preclude SARS-CoV-2 infection, do not rule out co-infections with other pathogens, and should not be used as the sole basis for treatment or other patient management decisions. Negative results must be combined with clinical observations, patient history, and epidemiological information. The expected result is Negative. Fact Sheet for Patients: SugarRoll.be Fact Sheet for Healthcare Providers: https://www.woods-mathews.com/ This test is not yet approved or cleared by the [place] FDA and  has been authorized for detection and/or diagnosis of SARS-CoV-2 by FDA under an Emergency Use Authorization (EUA). This EUA will remain  in effect (meaning this test can be used) for the duration of the COVID-19  declaration under Section 56 4(b)(1) of the Act, 21 U.S.C. section 360bbb-3(b)(1), unless the authorization is terminated or revoked sooner. Performed at [facility], [place] [address] [place], [place] [address]   Urine culture     Status: Abnormal   Collection Time: 02/26/19 10:32 PM   Specimen: Urine, Clean Catch  Result Value Ref Range Status   Specimen Description   Final    URINE, CLEAN CATCH Performed at [facility], [address] [place], [place] [address]    Special Requests    Final    NONE Performed at [facility], [place] [address], [place], [place] [address]    Culture >=100,000 COLONIES/mL KLEBSIELLA PNEUMONIAE (A)  Final   Report Status 02/28/2019 FINAL  Final   Organism ID, Bacteria KLEBSIELLA PNEUMONIAE (A)  Final      Susceptibility   Klebsiella pneumoniae - MIC*    AMPICILLIN RESISTANT Resistant     CEFAZOLIN <=4 SENSITIVE Sensitive     CEFTRIAXONE <=0.25 SENSITIVE Sensitive     CIPROFLOXACIN <=0.25 SENSITIVE Sensitive     GENTAMICIN <=1 SENSITIVE Sensitive     IMIPENEM 0.5 SENSITIVE Sensitive     NITROFURANTOIN 128 RESISTANT Resistant     TRIMETH/SULFA <=20 SENSITIVE Sensitive     AMPICILLIN/SULBACTAM <=2 SENSITIVE Sensitive     PIP/TAZO <=4 SENSITIVE Sensitive     * >=100,000 COLONIES/mL KLEBSIELLA PNEUMONIAE    Coagulation Studies: No results for input(s): LABPROT, INR in the last 72 hours.  Urinalysis: Recent Labs    02/26/19 2232  COLORURINE YELLOW*  LABSPEC 1.012  PHURINE 5.0  GLUCOSEU NEGATIVE  HGBUR MODERATE*  BILIRUBINUR NEGATIVE  KETONESUR NEGATIVE  PROTEINUR 100*  NITRITE NEGATIVE  LEUKOCYTESUR LARGE*      Imaging: No results found.   Medications:   . sodium chloride 30 mL (02/28/19 1503)  . cefTRIAXone (ROCEPHIN)  IV Stopped (02/28/19 1156)  . sodium bicarbonate 150 mEq in dextrose 5% 1000 mL Stopped (02/28/19 1113)   . atorvastatin  40 mg Oral QHS  . calcitRIOL  0.25 mcg Oral Daily  . calcium carbonate  1,000 mg Oral BID  . cholecalciferol  1,000 Units Oral Daily  . citalopram  20 mg Oral Daily  . donepezil  5 mg Oral QHS  . feeding supplement  1 Container Oral TID BM  . folic acid  1 mg Oral BID  . heparin  5,000 Units Subcutaneous Q8H  . metoprolol tartrate  25 mg Oral BID  . multivitamin with minerals  1 tablet Oral Daily  . mycophenolate  180 mg Oral BID  . sirolimus  3 mg Oral Daily  . sodium chloride flush  3 mL Intravenous Once  . traZODone  50 mg Oral QHS   sodium chloride, acetaminophen,  ondansetron **OR** ondansetron (ZOFRAN) IV  Assessment/ Plan:  [name] is a 69 y.o. black female with renal transplant 2009 on sirolimus, mycophenolate, prednisone, hypertension, history of renal cell carcinoma status post left nephrectomy.   1. Acute renal failure with metabolic acidosis on chronic kidney disease stage IV - T Baseline creatinine 1.49, GFR of 49 on 12/22.  Continue sirolimus, mycophenolate  Acute renal failure from poor PO intake - IV fluids sodium bicarbonate infsuion - Pending sirolimus trough level.  - Not a candidate to resume dialysis at this time.   2. Hypertension:   - metoprolol  3. Anemia with chronic kidney disease: hemoglobin 7.7. Normocytic - EPO scheduled subcu for tomorrow.   5. Secondary Hyperparathyroidism -  calcitriol - Check PTH   LOS: 2 [name] 1/14/20215:34 PM

## 2019-03-01 LAB — PARATHYROID HORMONE, INTACT (NO CA): PTH: 41 pg/mL (ref 15–65)

## 2019-03-01 LAB — RENAL FUNCTION PANEL
Albumin: 2.5 g/dL — ABNORMAL LOW (ref 3.5–5.0)
Anion gap: 12 (ref 5–15)
BUN: 61 mg/dL — ABNORMAL HIGH (ref 8–23)
CO2: 27 mmol/L (ref 22–32)
Calcium: 7.7 mg/dL — ABNORMAL LOW (ref 8.9–10.3)
Chloride: 106 mmol/L (ref 98–111)
Creatinine, Ser: 5.29 mg/dL — ABNORMAL HIGH (ref 0.44–1.00)
GFR calc Af Amer: 9 mL/min — ABNORMAL LOW (ref >60)
GFR calc non Af Amer: 8 mL/min — ABNORMAL LOW (ref >60)
Glucose, Bld: 114 mg/dL — ABNORMAL HIGH (ref 70–99)
Phosphorus: 3.1 mg/dL (ref 2.5–4.6)
Potassium: 3.8 mmol/L (ref 3.5–5.1)
Sodium: 145 mmol/L (ref 135–145)

## 2019-03-01 LAB — SIROLIMUS LEVEL: Sirolimus (Rapamycin): 10.7 ng/mL (ref 3.0–20.0)

## 2019-03-01 MED ORDER — CEPHALEXIN 500 MG PO CAPS
500.0000 mg | ORAL_CAPSULE | Freq: Two times a day (BID) | ORAL | Status: DC
  Administered 2019-03-02 – 2019-03-07 (×10): 500 mg via ORAL
  Filled 2019-03-01 (×12): qty 1

## 2019-03-01 NOTE — Care Management Important Message (Signed)
Important Message  Patient Details  Name: [name] MRN: [id number] Date of Birth: 29-May-1950   Medicare Important Message Given:  Yes  Reviewed verbally with daughter, [name], at [phone].  Requested copy of Medicare IM be emailed to her at [email or web address].  Verified address and sent copy securely.     Dannette Barbara 03/01/2019, 11:50 AM

## 2019-03-01 NOTE — Progress Notes (Addendum)
Brief Hx: [name]  is a 69 y.o. female, with history of chronic kidney disease stage IV, history of renal transplant in 2019 on immunosuppression, anemia of chronic disease, secondary hyperparathyroidism, essential hypertension, history of C. difficile colitis who was hospitalized 3 weeks back for acute on chronic kidney disease stage IV thought to be prerenal with dehydration and improved with IV fluids.  She was discharged to SNF from where she returned home 1 week back. As per daughter patient has not been eating or drinking after returning from SNF, and has been progressively weak.  Course in the ED Vitals stable.  Blood work showed significant leukocytosis (17.8k), hemoglobin of 9.9, BUN of 52 and creatinine of 3.56 (baseline of 1.7).  Glucose of 174.  Albumin of 2.9, LFTs otherwise normal.  POC Covid 19 was negative, PCR pending.  Subjective: Was lying on bed.  Tried to answer questions alert but not oriented.  No new fever.  Very minimal urine output overnight.  Objective: Vital signs in last 24 hours: Temp:  [98.3 F (36.8 C)-98.6 F (37 C)] 98.3 F (36.8 C) (01/15 0516) Pulse Rate:  [78-87] 78 (01/15 1039) Resp:  [20] 20 (01/15 0516) BP: (97-125)/(58-82) 97/58 (01/15 1039) SpO2:  [93 %-97 %] 97 % (01/15 0516)  Intake/Output from previous day: 01/14 0701 - 01/15 0700 In: 764.1 [I.V.:664.1] Out: -  Intake/Output this shift: Total I/O In: 237 [P.O.:237] Out: 50 [Urine:50]   General: Elderly female lying in bed in no acute distress, alert but not oriented HEENT: Pupils reactive bilaterally, EOMI, pallor present, no icterus, dry oral mucosa, temporal wasting, supple neck, no cervical pain with Chest: Clear to auscultation bilaterally CVS: Normal S1-S2, no murmurs rub or gallop GI: Soft, nondistended, nontender, bowel sounds present Musculoskeletal: Warm, no edema CNS: Alert and awake, oriented x0, nonfocal  Results for orders placed or performed during the hospital  encounter of 02/26/19 (from the past 24 hour(s))  CULTURE, BLOOD (ROUTINE X 2) w Reflex to ID Panel     Status: None (Preliminary result)   Collection Time: 02/28/19  1:41 PM   Specimen: BLOOD  Result Value Ref Range   Specimen Description BLOOD BLOOD RIGHT HAND    Special Requests      BOTTLES DRAWN AEROBIC AND ANAEROBIC Blood Culture adequate volume   Culture      NO GROWTH < 24 HOURS Performed at [facility], [address] [place], [place] [address]    Report Status PENDING   Renal function panel     Status: Abnormal   Collection Time: 03/01/19  8:47 AM  Result Value Ref Range   Sodium 145 135 - 145 mmol/L   Potassium 3.8 3.5 - 5.1 mmol/L   Chloride 106 98 - 111 mmol/L   CO2 27 22 - 32 mmol/L   Glucose, Bld 114 (H) 70 - 99 mg/dL   BUN 61 (H) 8 - 23 mg/dL   Creatinine, Ser 5.29 (H) 0.44 - 1.00 mg/dL   Calcium 7.7 (L) 8.9 - 10.3 mg/dL   Phosphorus 3.1 2.5 - 4.6 mg/dL   Albumin 2.5 (L) 3.5 - 5.0 g/dL   GFR calc non Af Amer 8 (L) >60 mL/min   GFR calc Af Amer 9 (L) >60 mL/min   Anion gap 12 5 - 15    Studies/Results: CT ABDOMEN PELVIS WO CONTRAST  Result Date: 02/26/2019 CLINICAL DATA:  Acute generalized abdominal pain, nausea, vomiting. EXAM: CT ABDOMEN AND PELVIS WITHOUT CONTRAST TECHNIQUE: Multidetector CT imaging of the  abdomen and pelvis was performed following the standard protocol without IV contrast. COMPARISON:  November 30, 2018. FINDINGS: Lower chest: Small pericardial effusion. Visualized lung bases are unremarkable. Hepatobiliary: No focal liver abnormality is seen. Status post cholecystectomy. No biliary dilatation. Pancreas: Unremarkable. No pancreatic ductal dilatation or surrounding inflammatory changes. Spleen: Normal in size without focal abnormality. Adrenals/Urinary Tract: Adrenal glands are unremarkable. Severe right renal atrophy is noted consistent with end-stage renal disease. Status post left nephrectomy. No hydronephrosis or renal obstruction is  noted. Renal transplant is noted in right lower quadrant without hydronephrosis. Urinary bladder is unremarkable. Stomach/Bowel: The stomach appears normal. There is no evidence of bowel obstruction or inflammation. The appendix is not visualized. Vascular/Lymphatic: Aortic atherosclerosis. No enlarged abdominal or pelvic lymph nodes. Reproductive: Status post hysterectomy. No adnexal masses. Other: No abdominal wall hernia or abnormality. No abdominopelvic ascites. 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 No acute osseous abnormality is noted. IMPRESSION: 1. Small pericardial effusion. 2. Severe right renal atrophy consistent with end-stage renal disease. 3. Status post left nephrectomy and right lower quadrant renal transplant. 4. 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 5. Aortic atherosclerosis. Aortic Atherosclerosis (ICD10-I70.0). Electronically Signed   By: Marijo Conception M.D.   On: 02/26/2019 14:28   US Renal Transplant w/Doppler  Result Date: 01/31/2019 CLINICAL DATA:  Acute kidney injury, renal transplant recipient in 2010 EXAM: ULTRASOUND OF RENAL TRANSPLANT WITH RENAL DOPPLER ULTRASOUND TECHNIQUE: Ultrasound examination of the renal transplant was performed with gray-scale, color and duplex doppler evaluation. COMPARISON:  CT renal colic 49/70/2637 FINDINGS: Transplant kidney location: RLQ Transplant Kidney: Renal measurements: 11.3 x 5.1 x 4.2 cm = volume: 125.56mL. Normal in size and parenchymal echogenicity. No evidence of mass. Mild calyceal dilatation resolved postvoiding, likely physiologic. No peri-transplant fluid collection seen. Color flow in the main renal artery:  Yes Color flow in the main renal vein:  Yes Duplex Doppler Evaluation: Main Renal Artery Resistive Index: 0.8 Venous waveform in main renal vein:  Present Intrarenal resistive index in upper pole:  0.7 (normal 0.6-0.8; equivocal 0.8-0.9; abnormal >= 0.9) Intrarenal  resistive index in lower pole: 0.7 (normal 0.6-0.8; equivocal 0.8-0.9; abnormal >= 0.9) Bladder: Normal for degree of bladder distention. Other findings:  None. IMPRESSION: Resistive index in the main renal artery is borderline elevated at 0.81. This is a nonspecific finding in the absence of comparison Doppler evaluation. Such findings could be considered within the clinical context. Mild caliectasis in the transplant resolved with voiding. Likely physiologic. No other abnormality is seen. Electronically Signed   By: Lovena Le M.D.   On: 01/31/2019 17:43   DG Chest Port 1 View  Result Date: 02/26/2019 CLINICAL DATA:  Weakness. EXAM: PORTABLE CHEST 1 VIEW COMPARISON:  September 22, 2018. FINDINGS: Stable cardiomegaly. No pneumothorax or pleural effusion is noted. Both lungs are clear. The visualized skeletal structures are unremarkable. IMPRESSION: No active disease. Electronically Signed   By: Marijo Conception M.D.   On: 02/26/2019 16:58    Scheduled Meds: . atorvastatin  40 mg Oral QHS  . calcium carbonate  1,000 mg Oral BID  . cephALEXin  500 mg Oral Q12H  . cholecalciferol  1,000 Units Oral Daily  . citalopram  20 mg Oral Daily  . donepezil  5 mg Oral QHS  . feeding supplement  1 Container Oral TID BM  . folic acid  1 mg Oral BID  . heparin  5,000 Units Subcutaneous Q8H  . multivitamin with minerals  1 tablet Oral Daily  . mycophenolate  180 mg Oral BID  . sirolimus  3 mg Oral Daily  . sodium chloride flush  3 mL Intravenous Once  . traZODone  50 mg Oral QHS   Continuous Infusions: . sodium chloride 30 mL (03/01/19 1025)  . sodium bicarbonate 150 mEq in dextrose 5% 1000 mL 150 mEq (03/01/19 1138)   PRN Meds:sodium chloride, acetaminophen, ondansetron **OR** ondansetron (ZOFRAN) IV  Assessment/Plan:  Acute renal failure superimposed on stage 4 chronic kidney disease ([place]): Creatinine worsened over the last 24 hours Likely prerenal with severe dehydration, poor p.o. intake. - Cont to  hold Lasix -Decreased urine output over the past 24 to 48 hours -Urine culture from 02/26/2019 growing Klebsiella; started antibiotic -  Monitor renal function closely.  Avoid nephrotoxins.   - Cont sodium bicarbonate infusion - Nephrology following. Appreciate Recs  Continue calcitriol, calcium carbonate and vitamin D3. -Continue sodium bicarb  UTI: Urine culture from 02/26/2019 growing Klebsiella -Leukocytosis resolving -Was on Rocephin from 02/27/2019; changed to Keflex 500 mg every 12 hours on 03/01/2019 -Check blood culture for negative  Active Problems: Leukocytosis: Improving -Likely from UTI - Could be due to severe dehydration as well.   - Monitor   -Continue antibiotic as above -We will obtain 2 sets of blood culture  Failure to thrive in adult PT and nutrition consult; continue diet per nutrition recommendation    Essential hypertension: -Currently running low in 90s -Has been on Lopressor 25 mg twice daily -May hold if needed -Continue volume expansion with albumin per nephrology    H/O kidney transplant Continue mycophenolate and sirolimus. -Serial Lemus level to follow per nephrology    Dementia, severe ([place]) Continue Aricept.  Anemia of chronic kidney disease Stable -May administer Epogen as per nephrology -To monitor  Diet: Dysphagia level 1 (PUREE w/ gravies added to moisten); Thin liquids. General aspiration precautions; Reflux precautions. Dietician f/u - recommend a Drink Supplement for ease of intake d/t Dementia, Cognitive decline. Medication Administration: Crushed with puree(as needed for safer swallowing)   Disposition: SNF when stable    LOS: 3 days   Shary Lamos Izetta Dakin

## 2019-03-01 NOTE — Progress Notes (Signed)
[facility]  ROUNDING NOTE   Subjective:   Creatinine 5.29 (4.15) (3.56)  UOP with purewick - not accurate.Now positioned correctly   Patient is pleasant. Alert to self and place only.   IV albumin yesterday.   Objective:  Vital signs in last 24 hours:  Temp:  [98.3 F (36.8 C)-99 F (37.2 C)] 98.3 F (36.8 C) (01/15 0516) Pulse Rate:  [78-87] 78 (01/15 1039) Resp:  [16-20] 20 (01/15 0516) BP: (97-125)/(49-82) 97/58 (01/15 1039) SpO2:  [93 %-99 %] 97 % (01/15 0516)  Weight change:  Filed Weights   02/26/19 1222  Weight: 90.7 kg    Intake/Output: I/O last 3 completed shifts: In: 1781.1 [P.O.:60; I.V.:1621.1; IV Piggyback:100] Out: -    Intake/Output this shift:  Total I/O In: 23 [P.O.:237] Out: 50 [Urine:50]  Physical Exam: General: NAD, Laying in bed  Head: Poor dentition   Eyes: Anicteric, PERRL  Neck: Supple, trachea midline  Lungs:  Clear to auscultation  Heart: Regular rate and rhythm  Abdomen:  Soft, nontender,   Extremities:  no peripheral edema.  Neurologic: Alert to self and place only.   Skin: No lesions        Basic Metabolic Panel: Recent Labs  Lab 02/26/19 1242 02/26/19 1242 02/27/19 0819 02/28/19 0315 03/01/19 0847  NA 143  --  145 146* 145  K 4.3  --  5.0 3.8 3.8  CL 106  --  116* 108 106  CO2 21*  --  18* 26 27  GLUCOSE 174*  --  103* 137* 114*  BUN 52*  --  57* 60* 61*  CREATININE 3.56*  --  3.56* 4.15* 5.29*  CALCIUM 9.0   < > 8.0* 7.7* 7.7*  PHOS  --   --   --  2.9 3.1   < > = values in this interval not displayed.    Liver Function Tests: Recent Labs  Lab 02/26/19 1242 02/28/19 0315 03/01/19 0847  AST 30  --   --   ALT 17  --   --   ALKPHOS 90  --   --   BILITOT 1.0  --   --   PROT 8.3*  --   --   ALBUMIN 2.9* 2.3* 2.5*   Recent Labs  Lab 02/26/19 1242  LIPASE 18   No results for input(s): AMMONIA in the last 168 hours.  CBC: Recent Labs  Lab 02/26/19 1242 02/27/19 0835 02/28/19 0315   WBC 17.8* 12.6* 11.2*  HGB 9.9* 8.4* 7.7*  HCT 33.5* 28.7* 25.9*  MCV 81.5 82.7 79.9*  PLT 217 155 167    Cardiac Enzymes: No results for input(s): CKTOTAL, CKMB, CKMBINDEX, TROPONINI in the last 168 hours.  BNP: Invalid input(s): POCBNP  CBG: No results for input(s): GLUCAP in the last 168 hours.  Microbiology: Results for orders placed or performed during the hospital encounter of 02/26/19  SARS CORONAVIRUS 2 (TAT 6-24 HRS) Nasopharyngeal Nasopharyngeal Swab     Status: None   Collection Time: 02/26/19  2:11 PM   Specimen: Nasopharyngeal Swab  Result Value Ref Range Status   SARS Coronavirus 2 NEGATIVE NEGATIVE Final    Comment: (NOTE) SARS-CoV-2 target nucleic acids are NOT DETECTED. The SARS-CoV-2 RNA is generally detectable in upper and lower respiratory specimens during the acute phase of infection. Negative results do not preclude SARS-CoV-2 infection, do not rule out co-infections with other pathogens, and should not be used as the sole basis for treatment or other patient management decisions. Negative  results must be combined with clinical observations, patient history, and epidemiological information. The expected result is Negative. Fact Sheet for Patients: SugarRoll.be Fact Sheet for Healthcare Providers: https://www.woods-mathews.com/ This test is not yet approved or cleared by the [place] FDA and  has been authorized for detection and/or diagnosis of SARS-CoV-2 by FDA under an Emergency Use Authorization (EUA). This EUA will remain  in effect (meaning this test can be used) for the duration of the COVID-19 declaration under Section 56 4(b)(1) of the Act, 21 U.S.C. section 360bbb-3(b)(1), unless the authorization is terminated or revoked sooner. Performed at [facility], [place] [address], [place], [place] [address]   Urine culture     Status: Abnormal   Collection Time: 02/26/19 10:32 PM   Specimen:  Urine, Clean Catch  Result Value Ref Range Status   Specimen Description   Final    URINE, CLEAN CATCH Performed at [facility], [address], [place], [place] [address]    Special Requests   Final    NONE Performed at [facility], [place] [address], [place], [place] [address]    Culture >=100,000 COLONIES/mL KLEBSIELLA PNEUMONIAE (A)  Final   Report Status 02/28/2019 FINAL  Final   Organism ID, Bacteria KLEBSIELLA PNEUMONIAE (A)  Final      Susceptibility   Klebsiella pneumoniae - MIC*    AMPICILLIN RESISTANT Resistant     CEFAZOLIN <=4 SENSITIVE Sensitive     CEFTRIAXONE <=0.25 SENSITIVE Sensitive     CIPROFLOXACIN <=0.25 SENSITIVE Sensitive     GENTAMICIN <=1 SENSITIVE Sensitive     IMIPENEM 0.5 SENSITIVE Sensitive     NITROFURANTOIN 128 RESISTANT Resistant     TRIMETH/SULFA <=20 SENSITIVE Sensitive     AMPICILLIN/SULBACTAM <=2 SENSITIVE Sensitive     PIP/TAZO <=4 SENSITIVE Sensitive     * >=100,000 COLONIES/mL KLEBSIELLA PNEUMONIAE  CULTURE, BLOOD (ROUTINE X 2) w Reflex to ID Panel     Status: None (Preliminary result)   Collection Time: 02/28/19 12:34 PM   Specimen: BLOOD  Result Value Ref Range Status   Specimen Description BLOOD BLOOD RIGHT HAND  Final   Special Requests   Final    BOTTLES DRAWN AEROBIC AND ANAEROBIC Blood Culture adequate volume   Culture   Final    NO GROWTH < 24 HOURS Performed at [facility], [address] [place], [place] [address]    Report Status PENDING  Incomplete  CULTURE, BLOOD (ROUTINE X 2) w Reflex to ID Panel     Status: None (Preliminary result)   Collection Time: 02/28/19  1:41 PM   Specimen: BLOOD  Result Value Ref Range Status   Specimen Description BLOOD BLOOD RIGHT HAND  Final   Special Requests   Final    BOTTLES DRAWN AEROBIC AND ANAEROBIC Blood Culture adequate volume   Culture   Final    NO GROWTH < 24 HOURS Performed at [facility], [address] [place], [place] [address]     Report Status PENDING  Incomplete    Coagulation Studies: No results for input(s): LABPROT, INR in the last 72 hours.  Urinalysis: Recent Labs    02/26/19 2232  COLORURINE YELLOW*  LABSPEC 1.012  PHURINE 5.0  GLUCOSEU NEGATIVE  HGBUR MODERATE*  BILIRUBINUR NEGATIVE  KETONESUR NEGATIVE  PROTEINUR 100*  NITRITE NEGATIVE  LEUKOCYTESUR LARGE*      Imaging: No results found.   Medications:   . sodium chloride 30 mL (03/01/19 1025)  . cefTRIAXone (ROCEPHIN)  IV 1 g (03/01/19 1026)  . sodium bicarbonate 150 mEq in dextrose 5% 1000 mL 150 mEq (03/01/19 1138)   . atorvastatin  40 mg Oral QHS  . calcitRIOL  0.25 mcg Oral Daily  . calcium carbonate  1,000 mg Oral BID  . cholecalciferol  1,000 Units Oral Daily  . citalopram  20 mg Oral Daily  . donepezil  5 mg Oral QHS  . feeding supplement  1 Container Oral TID BM  . folic acid  1 mg Oral BID  . heparin  5,000 Units Subcutaneous Q8H  . metoprolol tartrate  25 mg Oral BID  . multivitamin with minerals  1 tablet Oral Daily  . mycophenolate  180 mg Oral BID  . sirolimus  3 mg Oral Daily  . sodium chloride flush  3 mL Intravenous Once  . traZODone  50 mg Oral QHS   sodium chloride, acetaminophen, ondansetron **OR** ondansetron (ZOFRAN) IV  Assessment/ Plan:  [name] is a 69 y.o. black female with renal transplant 2009 on sirolimus, mycophenolate, prednisone, hypertension, history of renal cell carcinoma status post left nephrectomy.   1. Acute renal failure with metabolic acidosis on chronic kidney disease stage IV - T Baseline creatinine 1.49, GFR of 49 on 12/22.  Continue sirolimus, mycophenolate  Acute renal failure from poor PO intake Nonoliguric urine output.  Rapid rise in creatinine. Patient is not a candidate for dialysis at this time, Will continue to monitor  - IV fluids sodium bicarbonate at 162mL/hr - Pending sirolimus trough level.  - Not a candidate to resume dialysis at this time.   2.  Hypotension - Discontinue metoprolol  3. Anemia with chronic kidney disease: hemoglobin 7.7. Normocytic - EPO 10000 units subcu on 1/15.   5. Secondary Hyperparathyroidism: PTH 41 - low - discontinue calcitriol   LOS: 3 Lore Polka 1/15/202111:49 AM

## 2019-03-01 NOTE — Progress Notes (Signed)
Physical Therapy Treatment Patient Details Name: [name] MRN: [id number] DOB: [birth year] Today's Date: 03/01/2019    History of Present Illness Per MD H&P: Pt is a 69 y.o. female, with history of chronic kidney disease stage IV, history of renal transplant in 2019 on immunosuppression, anemia of chronic disease, secondary hyperparathyroidism, essential hypertension, history of C. difficile colitis who was hospitalized 3 weeks back for acute on chronic kidney disease stage IV thought to be prerenal with dehydration and improved with IV fluids.  She was discharged to SNF from where she returned home 1 week back. As per daughter patient has not been eating or drinking after returning from SNF, and has been progressively weak.  MD assessment includes: Acute renal failure superimposed on stage 4 chronic kidney disease, leukocytosis, HTN, FTT, anemia of chronic disease, and severe dementia.    PT Comments    Pt actively participated occasionally with below therex but only with max verbal and tactile cues.  Pt remained near total assist to come to sitting and occasional min A to prevent posterior LOB in sitting initially.  Once patient had been sitting for 10-15 sec she was able to maintain static sitting in neutral with SBA.  Pt required +2 Mod A to stand from an elevated surface and even with extensive cuing was unable to advance either LE during amb attempts.  Pt's max standing tolerance was only 10-15 sec this session during three standing sessions.  Pt will benefit from PT services in a SNF setting upon discharge to safely address deficits listed in patient problem list for decreased caregiver assistance and eventual return to PLOF.     Follow Up Recommendations  SNF     Equipment Recommendations  None recommended by PT    Recommendations for Other Services       Precautions / Restrictions Precautions Precautions: Fall Restrictions Weight Bearing Restrictions: No    Mobility  Bed  Mobility Overal bed mobility: Needs Assistance Bed Mobility: Supine to Sit;Sit to Supine     Supine to sit: +2 for physical assistance;Max assist Sit to supine: +2 for physical assistance;Max assist   General bed mobility comments: Pt near total assist for bed mobility tasks with very little participation noted  Transfers Overall transfer level: Needs assistance Equipment used: Rolling walker (2 wheeled) Transfers: Sit to/from Stand Sit to Stand: +2 physical assistance;Mod assist         General transfer comment: Pt able to stand x 3 with max standing tolerance 10-15 sec and unable to advance either LE  Ambulation/Gait             General Gait Details: Unable   Stairs             Wheelchair Mobility    Modified Rankin (Stroke Patients Only)       Balance Overall balance assessment: Needs assistance Sitting-balance support: Feet unsupported;Bilateral upper extremity supported Sitting balance-Leahy Scale: Fair Sitting balance - Comments: Posterior instabiltiy initially upon coming to sitting at the EOB but pt able to self-correct with tactile cuing and min A                                    Cognition Arousal/Alertness: Awake/alert Behavior During Therapy: WFL for tasks assessed/performed Overall Cognitive Status: No family/caregiver present to determine baseline cognitive functioning  Following Commands: Follows one step commands inconsistently       General Comments: Pt unable to follow commands consistently even with max verbal and tactile cues      Exercises Total Joint Exercises Ankle Circles/Pumps: AAROM;Both;10 reps;15 reps Short Arc Quad: AAROM;Both;10 reps;15 reps Heel Slides: AAROM;Both;10 reps;15 reps Hip ABduction/ADduction: AAROM;Both;10 reps;15 reps Straight Leg Raises: AAROM;Both;10 reps;15 reps Other Exercises Other Exercises: Static sitting at EOB x 5 min for core therex and  sitting balance Other Exercises: Rolling left and right x 4    General Comments        Pertinent Vitals/Pain Pain Assessment: No/denies pain    Home Living                      Prior Function            PT Goals (current goals can now be found in the care plan section) Progress towards PT goals: Not progressing toward goals - comment(Pt limited by significant functional weakness and decreased cognition)    Frequency    Min 2X/week      PT Plan Current plan remains appropriate    Co-evaluation              AM-PAC PT "6 Clicks" Mobility   Outcome Measure  Help needed turning from your back to your side while in a flat bed without using bedrails?: Total Help needed moving from lying on your back to sitting on the side of a flat bed without using bedrails?: Total Help needed moving to and from a bed to a chair (including a wheelchair)?: Total Help needed standing up from a chair using your arms (e.g., wheelchair or bedside chair)?: Total Help needed to walk in hospital room?: Total Help needed climbing 3-5 steps with a railing? : Total 6 Click Score: 6    End of Session Equipment Utilized During Treatment: Gait belt Activity Tolerance: Patient tolerated treatment well Patient left: in bed;with call bell/phone within reach;with bed alarm set;with nursing/sitter in room Nurse Communication: Mobility status PT Visit Diagnosis: Unsteadiness on feet (R26.81);Muscle weakness (generalized) (M62.81);Difficulty in walking, not elsewhere classified (R26.2)     Time: 0076-2263 PT Time Calculation (min) (ACUTE ONLY): 27 min  Charges:  $Therapeutic Exercise: 23-37 mins                     D. Scott Sheenah Dimitroff PT, DPT 03/01/19, 4:10 PM

## 2019-03-02 LAB — RENAL FUNCTION PANEL
Albumin: 2.2 g/dL — ABNORMAL LOW (ref 3.5–5.0)
Anion gap: 15 (ref 5–15)
BUN: 67 mg/dL — ABNORMAL HIGH (ref 8–23)
CO2: 33 mmol/L — ABNORMAL HIGH (ref 22–32)
Calcium: 7.2 mg/dL — ABNORMAL LOW (ref 8.9–10.3)
Chloride: 96 mmol/L — ABNORMAL LOW (ref 98–111)
Creatinine, Ser: 5.52 mg/dL — ABNORMAL HIGH (ref 0.44–1.00)
GFR calc Af Amer: 8 mL/min — ABNORMAL LOW (ref >60)
GFR calc non Af Amer: 7 mL/min — ABNORMAL LOW (ref >60)
Glucose, Bld: 137 mg/dL — ABNORMAL HIGH (ref 70–99)
Phosphorus: 3 mg/dL (ref 2.5–4.6)
Potassium: 3.3 mmol/L — ABNORMAL LOW (ref 3.5–5.1)
Sodium: 144 mmol/L (ref 135–145)

## 2019-03-02 LAB — CBC
HCT: 25.4 % — ABNORMAL LOW (ref 36.0–46.0)
Hemoglobin: 7.6 g/dL — ABNORMAL LOW (ref 12.0–15.0)
MCH: 24.1 pg — ABNORMAL LOW (ref 26.0–34.0)
MCHC: 29.9 g/dL — ABNORMAL LOW (ref 30.0–36.0)
MCV: 80.4 fL (ref 80.0–100.0)
Platelets: 166 10*3/uL (ref 150–400)
RBC: 3.16 MIL/uL — ABNORMAL LOW (ref 3.87–5.11)
RDW: 14.6 % (ref 11.5–15.5)
WBC: 5.6 10*3/uL (ref 4.0–10.5)
nRBC: 0 % (ref 0.0–0.2)

## 2019-03-02 LAB — MAGNESIUM: Magnesium: 1.9 mg/dL (ref 1.7–2.4)

## 2019-03-02 LAB — HEMOGLOBIN AND HEMATOCRIT, BLOOD
HCT: 24.6 % — ABNORMAL LOW (ref 36.0–46.0)
Hemoglobin: 7.4 g/dL — ABNORMAL LOW (ref 12.0–15.0)

## 2019-03-02 MED ORDER — SODIUM BICARBONATE-DEXTROSE 150-5 MEQ/L-% IV SOLN
150.0000 meq | INTRAVENOUS | Status: DC
  Filled 2019-03-02 (×3): qty 1000

## 2019-03-02 MED ORDER — RINGERS IV SOLN: INTRAVENOUS | Status: DC

## 2019-03-02 MED ORDER — POTASSIUM CHLORIDE 10 MEQ/100ML IV SOLN
10.0000 meq | INTRAVENOUS | Status: AC
  Administered 2019-03-02 (×3): 10 meq via INTRAVENOUS
  Filled 2019-03-02 (×2): qty 100

## 2019-03-02 MED ORDER — EPOETIN ALFA 10000 UNIT/ML IJ SOLN
10000.0000 [IU] | INTRAMUSCULAR | Status: DC
  Administered 2019-03-02: 10000 [IU] via SUBCUTANEOUS
  Filled 2019-03-02: qty 1

## 2019-03-02 MED ORDER — SODIUM BICARBONATE-DEXTROSE 150-5 MEQ/L-% IV SOLN: 150.0000 meq | INTRAVENOUS | Status: DC

## 2019-03-02 MED ORDER — LACTATED RINGERS IV SOLN: INTRAVENOUS | Status: DC

## 2019-03-02 MED ORDER — SODIUM BICARBONATE 8.4 % IV SOLN
INTRAVENOUS | Status: DC
  Filled 2019-03-02 (×3): qty 150

## 2019-03-02 NOTE — Progress Notes (Signed)
Brief Hx: Remell Giaimo  is a 69 y.o. female, with history of chronic kidney disease stage IV, history of renal transplant in 2019 on immunosuppression, anemia of chronic disease, secondary hyperparathyroidism, essential hypertension, history of C. difficile colitis who was hospitalized 3 weeks back for acute on chronic kidney disease stage IV thought to be prerenal with dehydration and improved with IV fluids.  She was discharged to SNF from where she returned home 1 week back. As per daughter patient has not been eating or drinking after returning from SNF, and has been progressively weak.  Course in the ED Vitals stable.  Blood work showed significant leukocytosis (17.8k), hemoglobin of 9.9, BUN of 52 and creatinine of 3.56 (baseline of 1.7).  Glucose of 174.  Albumin of 2.9, LFTs otherwise normal.  POC Covid 19 was negative, PCR pending.  Subjective: Patient was lying on bed this morning.  Appears to be disoriented.  But alert.  Objective: Vital signs in last 24 hours: Temp:  [97.7 F (36.5 C)-99.6 F (37.6 C)] 97.7 F (36.5 C) (01/16 1131) Pulse Rate:  [70-86] 86 (01/16 1131) Resp:  [18-20] 18 (01/16 1131) BP: (115-157)/(71-83) 157/83 (01/16 1131) SpO2:  [97 %-98 %] 98 % (01/16 1131)  Intake/Output from previous day: 01/15 0701 - 01/16 0700 In: 237 [P.O.:237] Out: 50 [Urine:50] Intake/Output this shift: No intake/output data recorded.   General: Elderly female lying in bed in no acute distress, alert but not oriented HEENT: Pupils reactive bilaterally, EOMI, pallor present, no icterus, moist mucosa; supple neck, no cervical pain with Chest: Clear to auscultation bilaterally CVS: Normal S1-S2, no murmurs rub or gallop GI: Soft, nondistended, nontender, bowel sounds present Musculoskeletal: Warm, no edema CNS: Alert and awake, oriented x0, nonfocal  Results for orders placed or performed during the hospital encounter of 02/26/19 (from the past 24 hour(s))  Hemoglobin and  hematocrit, blood     Status: Abnormal   Collection Time: 03/02/19 12:00 AM  Result Value Ref Range   Hemoglobin 7.4 (L) 12.0 - 15.0 g/dL   HCT 24.6 (L) 36.0 - 46.0 %  Renal function panel     Status: Abnormal   Collection Time: 03/02/19  8:00 AM  Result Value Ref Range   Sodium 144 135 - 145 mmol/L   Potassium 3.3 (L) 3.5 - 5.1 mmol/L   Chloride 96 (L) 98 - 111 mmol/L   CO2 33 (H) 22 - 32 mmol/L   Glucose, Bld 137 (H) 70 - 99 mg/dL   BUN 67 (H) 8 - 23 mg/dL   Creatinine, Ser 5.52 (H) 0.44 - 1.00 mg/dL   Calcium 7.2 (L) 8.9 - 10.3 mg/dL   Phosphorus 3.0 2.5 - 4.6 mg/dL   Albumin 2.2 (L) 3.5 - 5.0 g/dL   GFR calc non Af Amer 7 (L) >60 mL/min   GFR calc Af Amer 8 (L) >60 mL/min   Anion gap 15 5 - 15  CBC AM     Status: Abnormal   Collection Time: 03/02/19  8:00 AM  Result Value Ref Range   WBC 5.6 4.0 - 10.5 K/uL   RBC 3.16 (L) 3.87 - 5.11 MIL/uL   Hemoglobin 7.6 (L) 12.0 - 15.0 g/dL   HCT 25.4 (L) 36.0 - 46.0 %   MCV 80.4 80.0 - 100.0 fL   MCH 24.1 (L) 26.0 - 34.0 pg   MCHC 29.9 (L) 30.0 - 36.0 g/dL   RDW 14.6 11.5 - 15.5 %   Platelets 166 150 - 400 K/uL  nRBC 0.0 0.0 - 0.2 %    Studies/Results: CT ABDOMEN PELVIS WO CONTRAST  Result Date: 02/26/2019 CLINICAL DATA:  Acute generalized abdominal pain, nausea, vomiting. EXAM: CT ABDOMEN AND PELVIS WITHOUT CONTRAST TECHNIQUE: Multidetector CT imaging of the abdomen and pelvis was performed following the standard protocol without IV contrast. COMPARISON:  November 30, 2018. FINDINGS: Lower chest: Small pericardial effusion. Visualized lung bases are unremarkable. Hepatobiliary: No focal liver abnormality is seen. Status post cholecystectomy. No biliary dilatation. Pancreas: Unremarkable. No pancreatic ductal dilatation or surrounding inflammatory changes. Spleen: Normal in size without focal abnormality. Adrenals/Urinary Tract: Adrenal glands are unremarkable. Severe right renal atrophy is noted consistent with end-stage renal  disease. Status post left nephrectomy. No hydronephrosis or renal obstruction is noted. Renal transplant is noted in right lower quadrant without hydronephrosis. Urinary bladder is unremarkable. Stomach/Bowel: The stomach appears normal. There is no evidence of bowel obstruction or inflammation. The appendix is not visualized. Vascular/Lymphatic: Aortic atherosclerosis. No enlarged abdominal or pelvic lymph nodes. Reproductive: Status post hysterectomy. No adnexal masses. Other: No abdominal wall hernia or abnormality. No abdominopelvic ascites. 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 No acute osseous abnormality is noted. IMPRESSION: 1. Small pericardial effusion. 2. Severe right renal atrophy consistent with end-stage renal disease. 3. Status post left nephrectomy and right lower quadrant renal transplant. 4. 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 5. Aortic atherosclerosis. Aortic Atherosclerosis (ICD10-I70.0). Electronically Signed   By: Marijo Conception M.D.   On: 02/26/2019 14:28   US Renal Transplant w/Doppler  Result Date: 01/31/2019 CLINICAL DATA:  Acute kidney injury, renal transplant recipient in 2010 EXAM: ULTRASOUND OF RENAL TRANSPLANT WITH RENAL DOPPLER ULTRASOUND TECHNIQUE: Ultrasound examination of the renal transplant was performed with gray-scale, color and duplex doppler evaluation. COMPARISON:  CT renal colic 70/96/2836 FINDINGS: Transplant kidney location: RLQ Transplant Kidney: Renal measurements: 11.3 x 5.1 x 4.2 cm = volume: 125.51mL. Normal in size and parenchymal echogenicity. No evidence of mass. Mild calyceal dilatation resolved postvoiding, likely physiologic. No peri-transplant fluid collection seen. Color flow in the main renal artery:  Yes Color flow in the main renal vein:  Yes Duplex Doppler Evaluation: Main Renal Artery Resistive Index: 0.8 Venous waveform in main renal vein:  Present Intrarenal resistive index in upper  pole:  0.7 (normal 0.6-0.8; equivocal 0.8-0.9; abnormal >= 0.9) Intrarenal resistive index in lower pole: 0.7 (normal 0.6-0.8; equivocal 0.8-0.9; abnormal >= 0.9) Bladder: Normal for degree of bladder distention. Other findings:  None. IMPRESSION: Resistive index in the main renal artery is borderline elevated at 0.81. This is a nonspecific finding in the absence of comparison Doppler evaluation. Such findings could be considered within the clinical context. Mild caliectasis in the transplant resolved with voiding. Likely physiologic. No other abnormality is seen. Electronically Signed   By: Lovena Le M.D.   On: 01/31/2019 17:43   DG Chest Port 1 View  Result Date: 02/26/2019 CLINICAL DATA:  Weakness. EXAM: PORTABLE CHEST 1 VIEW COMPARISON:  September 22, 2018. FINDINGS: Stable cardiomegaly. No pneumothorax or pleural effusion is noted. Both lungs are clear. The visualized skeletal structures are unremarkable. IMPRESSION: No active disease. Electronically Signed   By: Marijo Conception M.D.   On: 02/26/2019 16:58    Scheduled Meds: . atorvastatin  40 mg Oral QHS  . calcium carbonate  1,000 mg Oral BID  . cephALEXin  500 mg Oral Q12H  . cholecalciferol  1,000 Units Oral Daily  . citalopram  20 mg Oral  Daily  . donepezil  5 mg Oral QHS  . feeding supplement  1 Container Oral TID BM  . folic acid  1 mg Oral BID  . heparin  5,000 Units Subcutaneous Q8H  . multivitamin with minerals  1 tablet Oral Daily  . mycophenolate  180 mg Oral BID  . sirolimus  3 mg Oral Daily  . sodium chloride flush  3 mL Intravenous Once  . traZODone  50 mg Oral QHS   Continuous Infusions: . sodium chloride 30 mL (03/01/19 1025)  . sodium bicarbonate 150 mEq in dextrose 5% 1000 mL 150 mEq (03/02/19 0654)   PRN Meds:sodium chloride, acetaminophen, ondansetron **OR** ondansetron (ZOFRAN) IV  Assessment/Plan:  Acute renal failure superimposed on stage 4 chronic kidney disease ([place]): Creatinine worsened over the last 24  hours Likely prerenal with severe dehydration, poor p.o. intake. - Cont to hold Lasix -Decreased urine output over the past 24 to 48 hours -Urine culture from 02/26/2019 growing Klebsiella; started antibiotic -  Monitor renal function closely.  Avoid nephrotoxins.   - Cont sodium bicarbonate infusion - Nephrology following. Appreciate Recs  Continue calcitriol, calcium carbonate and vitamin D3. -Continue sodium bicarb  UTI: Urine culture from 02/26/2019 growing Klebsiella -Leukocytosis resolving -Was on Rocephin from 02/27/2019; changed to Keflex 500 mg every 12 hours on 03/01/2019 -Check blood culture-so far negative  Active Problems: Leukocytosis: Resolved -Likely from UTI - Could be due to severe dehydration as well.   - Monitor   -Continue antibiotic as above  Failure to thrive in adult PT and nutrition consult; continue diet per nutrition recommendation    Essential hypertension: -Currently running low in 90s -Has been on Lopressor 25 mg twice daily -May hold if needed -Continue volume expansion with albumin per nephrology    H/O kidney transplant Continue mycophenolate and sirolimus. -Serial Lemus level to follow per nephrology    Dementia, severe ([place]) Continue Aricept.  Anemia of chronic kidney disease Stable -May administer Epogen as per nephrology -To monitor  Electrolyte imbalance: Hypokalemia -Being replaced -Recheck  Diet: Dysphagia level 1 (PUREE w/ gravies added to moisten); Thin liquids. General aspiration precautions; Reflux precautions. Dietician f/u - recommend a Drink Supplement for ease of intake d/t Dementia, Cognitive decline. Medication Administration: Crushed with puree(as needed for safer swallowing)   Disposition: SNF when stable    LOS: 4 days   Locklyn Henriquez Izetta Dakin

## 2019-03-02 NOTE — Progress Notes (Signed)
[place], [place] 03/02/19  Subjective:   Hospital day # 4  Patient appears to be confused.  Not able to provide any meaningful information Denies pain or shortness of breath Renal: 01/15 0701 - 01/16 0700 In: 237 [P.O.:237] Out: 50 [Urine:50] Lab Results  Component Value Date   CREATININE 5.52 (H) 03/02/2019   CREATININE 5.29 (H) 03/01/2019   CREATININE 4.15 (H) 02/28/2019     Objective:  Vital signs in last 24 hours:  Temp:  [97.7 F (36.5 C)-99.6 F (37.6 C)] 97.7 F (36.5 C) (01/16 1131) Pulse Rate:  [70-86] 86 (01/16 1131) Resp:  [18-20] 18 (01/16 1131) BP: (115-157)/(71-83) 157/83 (01/16 1131) SpO2:  [97 %-98 %] 98 % (01/16 1131)  Weight change:  Filed Weights   02/26/19 1222  Weight: 90.7 kg    Intake/Output:    Intake/Output Summary (Last 24 hours) at 03/02/2019 1629 Last data filed at 03/02/2019 0619 Gross per 24 hour  Intake 0 ml  Output --  Net 0 ml     Physical Exam: General:  No acute distress, laying in the bed  HEENT  moist oral mucous membranes  Pulm/lungs  mild scattered rhonchi, clear to auscultation  CVS/Heart  irregular rhythm  Abdomen:   Soft, mild lower abdominal tenderness  Extremities:  No peripheral edema  Neurologic:  Alert  Skin:  No acute rashes  Access:  Left forearm AV fistula       Basic Metabolic Panel:  Recent Labs  Lab 02/26/19 1242 02/26/19 1242 02/27/19 0819 02/27/19 0819 02/28/19 0315 03/01/19 0847 03/02/19 0800  NA 143  --  145  --  146* 145 144  K 4.3  --  5.0  --  3.8 3.8 3.3*  CL 106  --  116*  --  108 106 96*  CO2 21*  --  18*  --  26 27 33*  GLUCOSE 174*  --  103*  --  137* 114* 137*  BUN 52*  --  57*  --  60* 61* 67*  CREATININE 3.56*  --  3.56*  --  4.15* 5.29* 5.52*  CALCIUM 9.0   < > 8.0*   < > 7.7* 7.7* 7.2*  MG  --   --   --   --   --   --  1.9  PHOS  --   --   --   --  2.9 3.1 3.0   < > = values in this interval not displayed.     CBC: Recent Labs   Lab 02/26/19 1242 02/27/19 0835 02/28/19 0315 03/02/19 0000 03/02/19 0800  WBC 17.8* 12.6* 11.2*  --  5.6  HGB 9.9* 8.4* 7.7* 7.4* 7.6*  HCT 33.5* 28.7* 25.9* 24.6* 25.4*  MCV 81.5 82.7 79.9*  --  80.4  PLT 217 155 167  --  166     No results found for: HEPBSAG, HEPBSAB, HEPBIGM    Microbiology:  Recent Results (from the past 240 hour(s))  SARS CORONAVIRUS 2 (TAT 6-24 HRS) Nasopharyngeal Nasopharyngeal Swab     Status: None   Collection Time: 02/26/19  2:11 PM   Specimen: Nasopharyngeal Swab  Result Value Ref Range Status   SARS Coronavirus 2 NEGATIVE NEGATIVE Final    Comment: (NOTE) SARS-CoV-2 target nucleic acids are NOT DETECTED. The SARS-CoV-2 RNA is generally detectable in upper and lower respiratory specimens during the acute phase of infection. Negative results do not preclude SARS-CoV-2 infection, do not rule out co-infections with other pathogens, and  should not be used as the sole basis for treatment or other patient management decisions. Negative results must be combined with clinical observations, patient history, and epidemiological information. The expected result is Negative. Fact Sheet for Patients: SugarRoll.be Fact Sheet for Healthcare Providers: https://www.woods-mathews.com/ This test is not yet approved or cleared by the [place] FDA and  has been authorized for detection and/or diagnosis of SARS-CoV-2 by FDA under an Emergency Use Authorization (EUA). This EUA will remain  in effect (meaning this test can be used) for the duration of the COVID-19 declaration under Section 56 4(b)(1) of the Act, 21 U.S.C. section 360bbb-3(b)(1), unless the authorization is terminated or revoked sooner. Performed at [facility], [place] [address], [place], [place] [address]   Urine culture     Status: Abnormal   Collection Time: 02/26/19 10:32 PM   Specimen: Urine, Clean Catch  Result Value Ref Range Status    Specimen Description   Final    URINE, CLEAN CATCH Performed at [facility], [address], [place], [place] [address]    Special Requests   Final    NONE Performed at [facility], [place] [address], [place], [place] [address]    Culture >=100,000 COLONIES/mL KLEBSIELLA PNEUMONIAE (A)  Final   Report Status 02/28/2019 FINAL  Final   Organism ID, Bacteria KLEBSIELLA PNEUMONIAE (A)  Final      Susceptibility   Klebsiella pneumoniae - MIC*    AMPICILLIN RESISTANT Resistant     CEFAZOLIN <=4 SENSITIVE Sensitive     CEFTRIAXONE <=0.25 SENSITIVE Sensitive     CIPROFLOXACIN <=0.25 SENSITIVE Sensitive     GENTAMICIN <=1 SENSITIVE Sensitive     IMIPENEM 0.5 SENSITIVE Sensitive     NITROFURANTOIN 128 RESISTANT Resistant     TRIMETH/SULFA <=20 SENSITIVE Sensitive     AMPICILLIN/SULBACTAM <=2 SENSITIVE Sensitive     PIP/TAZO <=4 SENSITIVE Sensitive     * >=100,000 COLONIES/mL KLEBSIELLA PNEUMONIAE  CULTURE, BLOOD (ROUTINE X 2) w Reflex to ID Panel     Status: None (Preliminary result)   Collection Time: 02/28/19 12:34 PM   Specimen: BLOOD  Result Value Ref Range Status   Specimen Description BLOOD BLOOD RIGHT HAND  Final   Special Requests   Final    BOTTLES DRAWN AEROBIC AND ANAEROBIC Blood Culture adequate volume   Culture   Final    NO GROWTH 2 DAYS Performed at [facility], [address], [place], [place] [address]    Report Status PENDING  Incomplete  CULTURE, BLOOD (ROUTINE X 2) w Reflex to ID Panel     Status: None (Preliminary result)   Collection Time: 02/28/19  1:41 PM   Specimen: BLOOD  Result Value Ref Range Status   Specimen Description BLOOD BLOOD RIGHT HAND  Final   Special Requests   Final    BOTTLES DRAWN AEROBIC AND ANAEROBIC Blood Culture adequate volume   Culture   Final    NO GROWTH 2 DAYS Performed at [facility], [address] [place], [place] [address]    Report Status PENDING  Incomplete    Coagulation  Studies: No results for input(s): LABPROT, INR in the last 72 hours.  Urinalysis: No results for input(s): COLORURINE, LABSPEC, PHURINE, GLUCOSEU, HGBUR, BILIRUBINUR, KETONESUR, PROTEINUR, UROBILINOGEN, NITRITE, LEUKOCYTESUR in the last 72 hours.  Invalid input(s): APPERANCEUR    Imaging: No results found.   Medications:   . sodium chloride 30 mL (03/01/19 1025)  . potassium chloride 10  mEq (03/02/19 1520)  .  sodium bicarbonate  infusion 1000 mL     . atorvastatin  40 mg Oral QHS  . calcium carbonate  1,000 mg Oral BID  . cephALEXin  500 mg Oral Q12H  . cholecalciferol  1,000 Units Oral Daily  . citalopram  20 mg Oral Daily  . donepezil  5 mg Oral QHS  . feeding supplement  1 Container Oral TID BM  . folic acid  1 mg Oral BID  . heparin  5,000 Units Subcutaneous Q8H  . multivitamin with minerals  1 tablet Oral Daily  . mycophenolate  180 mg Oral BID  . sirolimus  3 mg Oral Daily  . sodium chloride flush  3 mL Intravenous Once  . traZODone  50 mg Oral QHS   sodium chloride, acetaminophen, ondansetron **OR** ondansetron (ZOFRAN) IV  Assessment/ Plan:  69 y.o. female with end-stage renal disease, deceased donor renal transplant 2009, history of renal cell carcinoma status post left nephrectomy, history of cholecystectomy, hysterectomy, anemia, secondary hyperparathyroidism, dementia, previous history of Klebsiella urinary tract infection associated sepsis in 2018 history of C. difficile colitis 2017 admitted on 02/26/2019 for Dehydration [E86.0] Leucocytosis [D72.829] Acute-on-chronic kidney injury ([place]) [N17.9, N18.9] Kidney transplant recipient [Z94.0] Acute renal failure superimposed on stage 3b chronic kidney disease, unspecified acute renal failure type ([place]) [N17.9, N18.32]   1.  Acute renal failure -Likely secondary to ATN from concurrent infection/sepsis Serum creatinine increased to 5.5 from 5.3 Low urine output Electrolytes and volume status are acceptable.  We  will continue to monitor closely  2.  Chronic kidney disease stage 4, Renal transplant status -Continued on sirolimus 3 mg daily and mycophenolate 180 mg twice a day -Baseline creatinine of 2.17/GFR 10 January 2019  3.  Urinary tract infection from Klebsiella diagnosed February 26, 2019 -Currently on oral Keflex    4.  Anemia of chronic kidney disease  -add Epogen weekly (sat) Lab Results  Component Value Date   HGB 7.6 (L) 03/02/2019    5.  Hypokalemia Likely induced by IV bicarbonate infusion.  Serum bicarbonate level now 33.  Will discontinue bicarbonate infusion Start IV Ringer lactate for IV fluid supplementation as oral intake appears to be poor   LOS: 4 Amazin Pincock 1/16/20214:29 [place], [place]  Note: This note was prepared with Dragon dictation. Any transcription errors are unintentional

## 2019-03-02 NOTE — Progress Notes (Signed)
Spoke to patient's daughter, [name], and gave her an update on the patient. Will continue to monitor.

## 2019-03-02 NOTE — Progress Notes (Signed)
Orders for cardiac monitor but there are no tele monitors left on unit.

## 2019-03-03 LAB — RENAL FUNCTION PANEL
Albumin: 2.2 g/dL — ABNORMAL LOW (ref 3.5–5.0)
Anion gap: 13 (ref 5–15)
BUN: 67 mg/dL — ABNORMAL HIGH (ref 8–23)
CO2: 34 mmol/L — ABNORMAL HIGH (ref 22–32)
Calcium: 7.1 mg/dL — ABNORMAL LOW (ref 8.9–10.3)
Chloride: 95 mmol/L — ABNORMAL LOW (ref 98–111)
Creatinine, Ser: 5.33 mg/dL — ABNORMAL HIGH (ref 0.44–1.00)
GFR calc Af Amer: 9 mL/min — ABNORMAL LOW (ref >60)
GFR calc non Af Amer: 8 mL/min — ABNORMAL LOW (ref >60)
Glucose, Bld: 100 mg/dL — ABNORMAL HIGH (ref 70–99)
Phosphorus: 2.6 mg/dL (ref 2.5–4.6)
Potassium: 3.5 mmol/L (ref 3.5–5.1)
Sodium: 142 mmol/L (ref 135–145)

## 2019-03-03 NOTE — Progress Notes (Signed)
[name], [place] 03/03/19  Subjective:   Hospital day # 5  Patient is more alert today.  However, still only oriented to self Denies pain or shortness of breath Renal: 01/16 0701 - 01/17 0700 In: 2102.3 [P.O.:180; I.V.:1642.2; IV Piggyback:280.1] Out: 550 [Urine:550] Lab Results  Component Value Date   CREATININE 5.33 (H) 03/03/2019   CREATININE 5.52 (H) 03/02/2019   CREATININE 5.29 (H) 03/01/2019     Objective:  Vital signs in last 24 hours:  Temp:  [98.2 F (36.8 C)-99.6 F (37.6 C)] 99.6 F (37.6 C) (01/17 0526) Pulse Rate:  [70-72] 72 (01/17 0526) Resp:  [16-20] 16 (01/17 0526) BP: (150-157)/(65-72) 150/72 (01/17 0526) SpO2:  [95 %-97 %] 95 % (01/17 0526)  Weight change:  Filed Weights   02/26/19 1222  Weight: 90.7 kg    Intake/Output:    Intake/Output Summary (Last 24 hours) at 03/03/2019 1305 Last data filed at 03/03/2019 0653 Gross per 24 hour  Intake 2102.32 ml  Output 550 ml  Net 1552.32 ml     Physical Exam: General:  No acute distress, laying in the bed  HEENT  moist oral mucous membranes  Pulm/lungs  mild scattered rhonchi, clear to auscultation  CVS/Heart  irregular rhythm  Abdomen:   Soft, mild lower abdominal tenderness  Extremities:  No peripheral edema  Neurologic:  Alert, interactive, follows commands  Skin:  No acute rashes  Access:  Left forearm AV fistula       Basic Metabolic Panel:  Recent Labs  Lab 02/27/19 0819 02/27/19 0819 02/28/19 0315 02/28/19 0315 03/01/19 0847 03/02/19 0800 03/03/19 0505  NA 145  --  146*  --  145 144 142  K 5.0  --  3.8  --  3.8 3.3* 3.5  CL 116*  --  108  --  106 96* 95*  CO2 18*  --  26  --  27 33* 34*  GLUCOSE 103*  --  137*  --  114* 137* 100*  BUN 57*  --  60*  --  61* 67* 67*  CREATININE 3.56*  --  4.15*  --  5.29* 5.52* 5.33*  CALCIUM 8.0*   < > 7.7*   < > 7.7* 7.2* 7.1*  MG  --   --   --   --   --  1.9  --   PHOS  --   --  2.9  --  3.1 3.0 2.6   <  > = values in this interval not displayed.     CBC: Recent Labs  Lab 02/26/19 1242 02/27/19 0835 02/28/19 0315 03/02/19 0000 03/02/19 0800  WBC 17.8* 12.6* 11.2*  --  5.6  HGB 9.9* 8.4* 7.7* 7.4* 7.6*  HCT 33.5* 28.7* 25.9* 24.6* 25.4*  MCV 81.5 82.7 79.9*  --  80.4  PLT 217 155 167  --  166     No results found for: HEPBSAG, HEPBSAB, HEPBIGM    Microbiology:  Recent Results (from the past 240 hour(s))  SARS CORONAVIRUS 2 (TAT 6-24 HRS) Nasopharyngeal Nasopharyngeal Swab     Status: None   Collection Time: 02/26/19  2:11 PM   Specimen: Nasopharyngeal Swab  Result Value Ref Range Status   SARS Coronavirus 2 NEGATIVE NEGATIVE Final    Comment: (NOTE) SARS-CoV-2 target nucleic acids are NOT DETECTED. The SARS-CoV-2 RNA is generally detectable in upper and lower respiratory specimens during the acute phase of infection. Negative results do not preclude SARS-CoV-2 infection, do not rule out co-infections  with other pathogens, and should not be used as the sole basis for treatment or other patient management decisions. Negative results must be combined with clinical observations, patient history, and epidemiological information. The expected result is Negative. Fact Sheet for Patients: SugarRoll.be Fact Sheet for Healthcare Providers: https://www.woods-mathews.com/ This test is not yet approved or cleared by the [place] FDA and  has been authorized for detection and/or diagnosis of SARS-CoV-2 by FDA under an Emergency Use Authorization (EUA). This EUA will remain  in effect (meaning this test can be used) for the duration of the COVID-19 declaration under Section 56 4(b)(1) of the Act, 21 U.S.C. section 360bbb-3(b)(1), unless the authorization is terminated or revoked sooner. Performed at [facility], [place] [address] [place], [place] [address]   Urine culture     Status: Abnormal   Collection Time: 02/26/19 10:32  PM   Specimen: Urine, Clean Catch  Result Value Ref Range Status   Specimen Description   Final    URINE, CLEAN CATCH Performed at [facility], [address], [place], [place] [address]    Special Requests   Final    NONE Performed at [facility], [place] [address] [place], [place] [address]    Culture >=100,000 COLONIES/mL KLEBSIELLA PNEUMONIAE (A)  Final   Report Status 02/28/2019 FINAL  Final   Organism ID, Bacteria KLEBSIELLA PNEUMONIAE (A)  Final      Susceptibility   Klebsiella pneumoniae - MIC*    AMPICILLIN RESISTANT Resistant     CEFAZOLIN <=4 SENSITIVE Sensitive     CEFTRIAXONE <=0.25 SENSITIVE Sensitive     CIPROFLOXACIN <=0.25 SENSITIVE Sensitive     GENTAMICIN <=1 SENSITIVE Sensitive     IMIPENEM 0.5 SENSITIVE Sensitive     NITROFURANTOIN 128 RESISTANT Resistant     TRIMETH/SULFA <=20 SENSITIVE Sensitive     AMPICILLIN/SULBACTAM <=2 SENSITIVE Sensitive     PIP/TAZO <=4 SENSITIVE Sensitive     * >=100,000 COLONIES/mL KLEBSIELLA PNEUMONIAE  CULTURE, BLOOD (ROUTINE X 2) w Reflex to ID Panel     Status: None (Preliminary result)   Collection Time: 02/28/19 12:34 PM   Specimen: BLOOD  Result Value Ref Range Status   Specimen Description BLOOD BLOOD RIGHT HAND  Final   Special Requests   Final    BOTTLES DRAWN AEROBIC AND ANAEROBIC Blood Culture adequate volume   Culture   Final    NO GROWTH 3 DAYS Performed at [facility], [address] [place], [place] [address]    Report Status PENDING  Incomplete  CULTURE, BLOOD (ROUTINE X 2) w Reflex to ID Panel     Status: None (Preliminary result)   Collection Time: 02/28/19  1:41 PM   Specimen: BLOOD  Result Value Ref Range Status   Specimen Description BLOOD BLOOD RIGHT HAND  Final   Special Requests   Final    BOTTLES DRAWN AEROBIC AND ANAEROBIC Blood Culture adequate volume   Culture   Final    NO GROWTH 3 DAYS Performed at [facility], [address] [place], [place]  [address]    Report Status PENDING  Incomplete    Coagulation Studies: No results for input(s): LABPROT, INR in the last 72 hours.  Urinalysis: No results for input(s): COLORURINE, LABSPEC, PHURINE, GLUCOSEU, HGBUR, BILIRUBINUR, KETONESUR, PROTEINUR, UROBILINOGEN, NITRITE, LEUKOCYTESUR in the last 72 hours.  Invalid input(s): APPERANCEUR    Imaging: No results found.   Medications:   . sodium chloride Stopped (03/01/19 1134)  .  lactated ringers 50 mL/hr at 03/02/19 1800   . atorvastatin  40 mg Oral QHS  . calcium carbonate  1,000 mg Oral BID  . cephALEXin  500 mg Oral Q12H  . cholecalciferol  1,000 Units Oral Daily  . citalopram  20 mg Oral Daily  . donepezil  5 mg Oral QHS  . epoetin (EPOGEN/PROCRIT) injection  10,000 Units Subcutaneous Weekly  . feeding supplement  1 Container Oral TID BM  . folic acid  1 mg Oral BID  . heparin  5,000 Units Subcutaneous Q8H  . multivitamin with minerals  1 tablet Oral Daily  . mycophenolate  180 mg Oral BID  . sirolimus  3 mg Oral Daily  . sodium chloride flush  3 mL Intravenous Once  . traZODone  50 mg Oral QHS   sodium chloride, acetaminophen, ondansetron **OR** ondansetron (ZOFRAN) IV  Assessment/ Plan:  69 y.o. female with end-stage renal disease, deceased donor renal transplant 2009, history of renal cell carcinoma status post left nephrectomy, history of cholecystectomy, hysterectomy, anemia, secondary hyperparathyroidism, dementia, previous history of Klebsiella urinary tract infection associated sepsis in 2018 history of C. difficile colitis 2017 admitted on 02/26/2019 for Dehydration [E86.0] Leucocytosis [D72.829] Acute-on-chronic kidney injury ([place]) [N17.9, N18.9] Kidney transplant recipient [Z94.0] Acute renal failure superimposed on stage 3b chronic kidney disease, unspecified acute renal failure type ([place]) [N17.9, N18.32]   1.  Acute renal failure -Likely secondary to ATN from concurrent infection/sepsis Serum creatinine  slightly lower today at 5.3 Low urine output Electrolytes and volume status are acceptable.  We will continue to monitor closely Continue treating underlying infection  2.  Chronic kidney disease stage 4, Renal transplant status -Continued on sirolimus 3 mg daily and mycophenolate 180 mg twice a day -Baseline creatinine of 2.17/GFR 10 January 2019  3.  Urinary tract infection from Klebsiella diagnosed February 26, 2019 -Currently on oral Keflex    4.  Anemia of chronic kidney disease  Lab Results  Component Value Date   HGB 7.6 (L) 03/02/2019  - Epogen weekly (sat)  5.  Hypokalemia Maintenance lactated Ringer IV infusion.   LOS: 5 Keyry [place] 1/17/20211:05 PM  [place], [place]  Note: This note was prepared with Dragon dictation. Any transcription errors are unintentional

## 2019-03-03 NOTE — Progress Notes (Signed)
Brief Hx: [name]  is a 69 y.o. female, with history of chronic kidney disease stage IV, history of renal transplant in 2019 on immunosuppression, anemia of chronic disease, secondary hyperparathyroidism, essential hypertension, history of C. difficile colitis who was hospitalized 3 weeks back for acute on chronic kidney disease stage IV thought to be prerenal with dehydration and improved with IV fluids.  She was discharged to SNF from where she returned home 1 week back. As per daughter patient has not been eating or drinking after returning from SNF, and has been progressively weak.  Course in the ED Vitals stable.  Blood work showed significant leukocytosis (17.8k), hemoglobin of 9.9, BUN of 52 and creatinine of 3.56 (baseline of 1.7).  Glucose of 174.  Albumin of 2.9, LFTs otherwise normal.  POC Covid 19 was negative, PCR pending.  Subjective: Alert but not oriented.  No acute issues overnight.  Objective: Vital signs in last 24 hours: Temp:  [98.2 F (36.8 C)-99.6 F (37.6 C)] 99.6 F (37.6 C) (01/17 0526) Pulse Rate:  [70-72] 72 (01/17 0526) Resp:  [16-20] 16 (01/17 0526) BP: (150-157)/(65-72) 150/72 (01/17 0526) SpO2:  [95 %-97 %] 95 % (01/17 0526)  Intake/Output from previous day: 01/16 0701 - 01/17 0700 In: 2102.3 [P.O.:180; I.V.:1642.2] Out: 550 [Urine:550] Intake/Output this shift: No intake/output data recorded.   General: Elderly female lying in bed in no acute distress, alert but not oriented HEENT: Pupils reactive bilaterally, EOMI, pallor present, no icterus, moist mucosa; supple neck, no cervical pain with Chest: Clear to auscultation bilaterally CVS: Normal S1-S2, no murmurs rub or gallop GI: Soft, nondistended, nontender, bowel sounds present Musculoskeletal: Warm, no edema CNS: Alert and awake, oriented x0, nonfocal  Results for orders placed or performed during the hospital encounter of 02/26/19 (from the past 24 hour(s))  Renal function panel      Status: Abnormal   Collection Time: 03/03/19  5:05 AM  Result Value Ref Range   Sodium 142 135 - 145 mmol/L   Potassium 3.5 3.5 - 5.1 mmol/L   Chloride 95 (L) 98 - 111 mmol/L   CO2 34 (H) 22 - 32 mmol/L   Glucose, Bld 100 (H) 70 - 99 mg/dL   BUN 67 (H) 8 - 23 mg/dL   Creatinine, Ser 5.33 (H) 0.44 - 1.00 mg/dL   Calcium 7.1 (L) 8.9 - 10.3 mg/dL   Phosphorus 2.6 2.5 - 4.6 mg/dL   Albumin 2.2 (L) 3.5 - 5.0 g/dL   GFR calc non Af Amer 8 (L) >60 mL/min   GFR calc Af Amer 9 (L) >60 mL/min   Anion gap 13 5 - 15    Studies/Results: CT ABDOMEN PELVIS WO CONTRAST  Result Date: 02/26/2019 CLINICAL DATA:  Acute generalized abdominal pain, nausea, vomiting. EXAM: CT ABDOMEN AND PELVIS WITHOUT CONTRAST TECHNIQUE: Multidetector CT imaging of the abdomen and pelvis was performed following the standard protocol without IV contrast. COMPARISON:  November 30, 2018. FINDINGS: Lower chest: Small pericardial effusion. Visualized lung bases are unremarkable. Hepatobiliary: No focal liver abnormality is seen. Status post cholecystectomy. No biliary dilatation. Pancreas: Unremarkable. No pancreatic ductal dilatation or surrounding inflammatory changes. Spleen: Normal in size without focal abnormality. Adrenals/Urinary Tract: Adrenal glands are unremarkable. Severe right renal atrophy is noted consistent with end-stage renal disease. Status post left nephrectomy. No hydronephrosis or renal obstruction is noted. Renal transplant is noted in right lower quadrant without hydronephrosis. Urinary bladder is unremarkable. Stomach/Bowel: The stomach appears normal. There is no evidence of bowel obstruction or  inflammation. The appendix is not visualized. Vascular/Lymphatic: Aortic atherosclerosis. No enlarged abdominal or pelvic lymph nodes. Reproductive: Status post hysterectomy. No adnexal masses. Other: No abdominal wall hernia or abnormality. No abdominopelvic ascites. Musculoskeletal: Possible avascular necrosis is noted  and right femoral head. No acute osseous abnormality is noted. IMPRESSION: 1. Small pericardial effusion. 2. Severe right renal atrophy consistent with end-stage renal disease. 3. Status post left nephrectomy and right lower quadrant renal transplant. 4. 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 5. Aortic atherosclerosis. Aortic Atherosclerosis (ICD10-I70.0). Electronically Signed   By: Marijo Conception M.D.   On: 02/26/2019 14:28   DG Chest Port 1 View  Result Date: 02/26/2019 CLINICAL DATA:  Weakness. EXAM: PORTABLE CHEST 1 VIEW COMPARISON:  September 22, 2018. FINDINGS: Stable cardiomegaly. No pneumothorax or pleural effusion is noted. Both lungs are clear. The visualized skeletal structures are unremarkable. IMPRESSION: No active disease. Electronically Signed   By: Marijo Conception M.D.   On: 02/26/2019 16:58    Scheduled Meds: . atorvastatin  40 mg Oral QHS  . calcium carbonate  1,000 mg Oral BID  . cephALEXin  500 mg Oral Q12H  . cholecalciferol  1,000 Units Oral Daily  . citalopram  20 mg Oral Daily  . donepezil  5 mg Oral QHS  . epoetin (EPOGEN/PROCRIT) injection  10,000 Units Subcutaneous Weekly  . feeding supplement  1 Container Oral TID BM  . folic acid  1 mg Oral BID  . heparin  5,000 Units Subcutaneous Q8H  . multivitamin with minerals  1 tablet Oral Daily  . mycophenolate  180 mg Oral BID  . sirolimus  3 mg Oral Daily  . sodium chloride flush  3 mL Intravenous Once  . traZODone  50 mg Oral QHS   Continuous Infusions: . sodium chloride Stopped (03/01/19 1134)  . lactated ringers 50 mL/hr at 03/02/19 1800   PRN Meds:sodium chloride, acetaminophen, ondansetron **OR** ondansetron (ZOFRAN) IV  Assessment/Plan:  Acute renal failure superimposed on stage 4 chronic kidney disease ([place]): Creatinine worsened over the last 24 hours Likely prerenal with severe dehydration, poor p.o. intake. - Cont to hold Lasix -Urine output has gone down recently -Urine culture  from 02/26/2019 growing Klebsiella; started antibiotic -  Monitor renal function closely.  Avoid nephrotoxins.   - Nephrology following. Appreciate Recs  Continue calcitriol, calcium carbonate and vitamin D3.  UTI: Urine culture from 02/26/2019 growing Klebsiella -Leukocytosis resolved -Was on Rocephin from 02/27/2019; changed to Keflex 500 mg every 12 hours on 03/01/2019 complete the course of a total 10 days -Check blood culture-so far negative  Active Problems: Leukocytosis: Resolved -Likely from UTI - Could be due to severe dehydration as well.   - Monitor   -Continue antibiotic as above  Failure to thrive in adult -with severe dementia PT and nutrition consult; continue diet per nutrition recommendation    Essential hypertension: -Currently running low in 90s -Has been on Lopressor 25 mg twice daily -May hold if needed -Continue volume expansion with albumin per nephrology    H/O kidney transplant Continue mycophenolate and sirolimus. -Serial Lemus level to follow per nephrology -Not a candidate for dialysis yet    Dementia, severe ([place]) Continue Aricept.  Anemia of chronic kidney disease Stable -May administer Epogen as per nephrology -To monitor  Electrolyte imbalance: Hypokalemia -Being replaced -Recheck -Started lactated Ringer  Diet: Dysphagia level 1 (PUREE w/ gravies added to moisten); Thin liquids. General aspiration precautions; Reflux precautions. Dietician f/u - recommend a Drink  Supplement for ease of intake d/t Dementia, Cognitive decline. Medication Administration: Crushed with puree(as needed for safer swallowing)   Mobility: Continue PT  Disposition: SNF when stable    LOS: 5 days   Lemuel Boodram Izetta Dakin

## 2019-03-04 LAB — RENAL FUNCTION PANEL
Albumin: 2.1 g/dL — ABNORMAL LOW (ref 3.5–5.0)
Anion gap: 12 (ref 5–15)
BUN: 60 mg/dL — ABNORMAL HIGH (ref 8–23)
CO2: 34 mmol/L — ABNORMAL HIGH (ref 22–32)
Calcium: 7.3 mg/dL — ABNORMAL LOW (ref 8.9–10.3)
Chloride: 97 mmol/L — ABNORMAL LOW (ref 98–111)
Creatinine, Ser: 4.99 mg/dL — ABNORMAL HIGH (ref 0.44–1.00)
GFR calc Af Amer: 10 mL/min — ABNORMAL LOW (ref >60)
GFR calc non Af Amer: 8 mL/min — ABNORMAL LOW (ref >60)
Glucose, Bld: 103 mg/dL — ABNORMAL HIGH (ref 70–99)
Phosphorus: 3.1 mg/dL (ref 2.5–4.6)
Potassium: 3.5 mmol/L (ref 3.5–5.1)
Sodium: 143 mmol/L (ref 135–145)

## 2019-03-04 LAB — HEMOGLOBIN AND HEMATOCRIT, BLOOD
HCT: 23.9 % — ABNORMAL LOW (ref 36.0–46.0)
Hemoglobin: 7.1 g/dL — ABNORMAL LOW (ref 12.0–15.0)

## 2019-03-04 MED ORDER — METOPROLOL TARTRATE 25 MG PO TABS
12.5000 mg | ORAL_TABLET | Freq: Two times a day (BID) | ORAL | Status: DC
  Administered 2019-03-04 – 2019-03-06 (×5): 12.5 mg via ORAL
  Filled 2019-03-04 (×5): qty 1

## 2019-03-04 NOTE — Progress Notes (Signed)
Physical Therapy Treatment Patient Details Name: [name] MRN: [id number] DOB: Feb 14, 1951 Today's Date: 03/04/2019    History of Present Illness Per MD H&P: Pt is a 69 y.o. female, with history of chronic kidney disease stage IV, history of renal transplant in 2019 on immunosuppression, anemia of chronic disease, secondary hyperparathyroidism, essential hypertension, history of C. difficile colitis who was hospitalized 3 weeks back for acute on chronic kidney disease stage IV thought to be prerenal with dehydration and improved with IV fluids.  She was discharged to SNF from where she returned home 1 week back. As per daughter patient has not been eating or drinking after returning from SNF, and has been progressively weak.  MD assessment includes: Acute renal failure superimposed on stage 4 chronic kidney disease, leukocytosis, HTN, FTT, anemia of chronic disease, and severe dementia.    PT Comments    Pt pleasant during the session and would occasionally participate with below therex but infrequently at best and only with extensive verbal and tactile cues for technique.  Pt required extensive +2 assist to stand and was only able to remain standing 5 sec before impulsively returning to sitting.  Progress towards functional goals limited by significant functional weakness and cognitive impairment.  Pt will benefit from a trial of PT services in a SNF setting upon discharge to safely address deficits listed in patient problem list for decreased caregiver assistance and eventual return to PLOF.    Follow Up Recommendations  SNF     Equipment Recommendations  None recommended by PT    Recommendations for Other Services       Precautions / Restrictions Precautions Precautions: Fall Restrictions Weight Bearing Restrictions: No    Mobility  Bed Mobility Overal bed mobility: Needs Assistance Bed Mobility: Supine to Sit;Sit to Supine     Supine to sit: +2 for physical assistance;Max  assist Sit to supine: +2 for physical assistance;Max assist   General bed mobility comments: Pt near total assist for bed mobility tasks with very little participation noted  Transfers Overall transfer level: Needs assistance Equipment used: Rolling walker (2 wheeled) Transfers: Sit to/from Stand Sit to Stand: +2 physical assistance;Mod assist         General transfer comment: Max standing time 5 sec with +2 Mod A to stand and to prevent LOB  Ambulation/Gait             General Gait Details: Unable   Stairs             Wheelchair Mobility    Modified Rankin (Stroke Patients Only)       Balance Overall balance assessment: Needs assistance Sitting-balance support: Feet unsupported;Bilateral upper extremity supported Sitting balance-Leahy Scale: Fair     Standing balance support: Bilateral upper extremity supported Standing balance-Leahy Scale: Poor                              Cognition Arousal/Alertness: Awake/alert Behavior During Therapy: WFL for tasks assessed/performed Overall Cognitive Status: No family/caregiver present to determine baseline cognitive functioning                                 General Comments: Pt unable to follow commands consistently even with max verbal and tactile cues      Exercises Other Exercises Other Exercises: BLE seated and supine hip, knee, ankle AAROM in available planes with max tactile and  verbal cues for technique    General Comments        Pertinent Vitals/Pain Pain Assessment: No/denies pain    Home Living                      Prior Function            PT Goals (current goals can now be found in the care plan section) Progress towards PT goals: Not progressing toward goals - comment(limited by cognition and weakness)    Frequency    Min 2X/week      PT Plan Current plan remains appropriate    Co-evaluation              AM-PAC PT "6 Clicks"  Mobility   Outcome Measure  Help needed turning from your back to your side while in a flat bed without using bedrails?: Total Help needed moving from lying on your back to sitting on the side of a flat bed without using bedrails?: Total Help needed moving to and from a bed to a chair (including a wheelchair)?: Total Help needed standing up from a chair using your arms (e.g., wheelchair or bedside chair)?: Total Help needed to walk in hospital room?: Total Help needed climbing 3-5 steps with a railing? : Total 6 Click Score: 6    End of Session Equipment Utilized During Treatment: Gait belt Activity Tolerance: Patient tolerated treatment well Patient left: in bed;with call bell/phone within reach;with bed alarm set;with nursing/sitter in room Nurse Communication: Mobility status PT Visit Diagnosis: Unsteadiness on feet (R26.81);Muscle weakness (generalized) (M62.81);Difficulty in walking, not elsewhere classified (R26.2)     Time: 7026-3785 PT Time Calculation (min) (ACUTE ONLY): 23 min  Charges:  $Therapeutic Exercise: 8-22 mins $Therapeutic Activity: 8-22 mins                     D. Scott Axten Pascucci PT, DPT 03/04/19, 12:47 PM

## 2019-03-04 NOTE — Progress Notes (Signed)
Brief Hx: [name]  is a 69 y.o. female, with history of chronic kidney disease stage IV, history of renal transplant in 2019 on immunosuppression, anemia of chronic disease, secondary hyperparathyroidism, essential hypertension, history of C. difficile colitis who was hospitalized 3 weeks back for acute on chronic kidney disease stage IV thought to be prerenal with dehydration and improved with IV fluids.  She was discharged to SNF from where she returned home 1 week back. As per daughter patient has not been eating or drinking after returning from SNF, and has been progressively weak.  Course in the ED Vitals stable.  Blood work showed significant leukocytosis (17.8k), hemoglobin of 9.9, BUN of 52 and creatinine of 3.56 (baseline of 1.7).  Glucose of 174.  Albumin of 2.9, LFTs otherwise normal.  POC Covid 19 was negative, PCR pending.  Subjective: No acute issues overnight or this morning.  Patient remained confused but alert.  Objective: Vital signs in last 24 hours: Temp:  [98.2 F (36.8 C)-98.6 F (37 C)] 98.6 F (37 C) (01/18 0347) Pulse Rate:  [79-88] 88 (01/18 0347) Resp:  [20] 20 (01/18 0347) BP: (139-153)/(64-83) 153/83 (01/18 0347) SpO2:  [95 %-96 %] 96 % (01/18 0347)  Intake/Output from previous day: 01/17 0701 - 01/18 0700 In: 574.3 [I.V.:574.3] Out: 4004 [Urine:4004] Intake/Output this shift: No intake/output data recorded.   General: Elderly female lying in bed in no acute distress, alert but not oriented HEENT: Pupils reactive bilaterally, EOMI, pallor present, no icterus, moist mucosa; supple neck, no cervical pain with Chest: Clear to auscultation bilaterally CVS: Normal S1-S2, no murmurs rub or gallop GI: Soft, nondistended, nontender, bowel sounds present Musculoskeletal: Warm, no edema CNS: Alert and awake, oriented x0, nonfocal  Results for orders placed or performed during the hospital encounter of 02/26/19 (from the past 24 hour(s))  Hemoglobin and  hematocrit, blood     Status: Abnormal   Collection Time: 03/03/19 11:46 PM  Result Value Ref Range   Hemoglobin 7.1 (L) 12.0 - 15.0 g/dL   HCT 23.9 (L) 36.0 - 46.0 %  Renal function panel     Status: Abnormal   Collection Time: 03/04/19  5:24 AM  Result Value Ref Range   Sodium 143 135 - 145 mmol/L   Potassium 3.5 3.5 - 5.1 mmol/L   Chloride 97 (L) 98 - 111 mmol/L   CO2 34 (H) 22 - 32 mmol/L   Glucose, Bld 103 (H) 70 - 99 mg/dL   BUN 60 (H) 8 - 23 mg/dL   Creatinine, Ser 4.99 (H) 0.44 - 1.00 mg/dL   Calcium 7.3 (L) 8.9 - 10.3 mg/dL   Phosphorus 3.1 2.5 - 4.6 mg/dL   Albumin 2.1 (L) 3.5 - 5.0 g/dL   GFR calc non Af Amer 8 (L) >60 mL/min   GFR calc Af Amer 10 (L) >60 mL/min   Anion gap 12 5 - 15    Studies/Results: CT ABDOMEN PELVIS WO CONTRAST  Result Date: 02/26/2019 CLINICAL DATA:  Acute generalized abdominal pain, nausea, vomiting. EXAM: CT ABDOMEN AND PELVIS WITHOUT CONTRAST TECHNIQUE: Multidetector CT imaging of the abdomen and pelvis was performed following the standard protocol without IV contrast. COMPARISON:  November 30, 2018. FINDINGS: Lower chest: Small pericardial effusion. Visualized lung bases are unremarkable. Hepatobiliary: No focal liver abnormality is seen. Status post cholecystectomy. No biliary dilatation. Pancreas: Unremarkable. No pancreatic ductal dilatation or surrounding inflammatory changes. Spleen: Normal in size without focal abnormality. Adrenals/Urinary Tract: Adrenal glands are unremarkable. Severe right renal atrophy is  noted consistent with end-stage renal disease. Status post left nephrectomy. No hydronephrosis or renal obstruction is noted. Renal transplant is noted in right lower quadrant without hydronephrosis. Urinary bladder is unremarkable. Stomach/Bowel: The stomach appears normal. There is no evidence of bowel obstruction or inflammation. The appendix is not visualized. Vascular/Lymphatic: Aortic atherosclerosis. No enlarged abdominal or pelvic  lymph nodes. Reproductive: Status post hysterectomy. No adnexal masses. Other: No abdominal wall hernia or abnormality. No abdominopelvic ascites. 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 No acute osseous abnormality is noted. IMPRESSION: 1. Small pericardial effusion. 2. Severe right renal atrophy consistent with end-stage renal disease. 3. Status post left nephrectomy and right lower quadrant renal transplant. 4. 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 5. Aortic atherosclerosis. Aortic Atherosclerosis (ICD10-I70.0). Electronically Signed   By: Marijo Conception M.D.   On: 02/26/2019 14:28   DG Chest Port 1 View  Result Date: 02/26/2019 CLINICAL DATA:  Weakness. EXAM: PORTABLE CHEST 1 VIEW COMPARISON:  September 22, 2018. FINDINGS: Stable cardiomegaly. No pneumothorax or pleural effusion is noted. Both lungs are clear. The visualized skeletal structures are unremarkable. IMPRESSION: No active disease. Electronically Signed   By: Marijo Conception M.D.   On: 02/26/2019 16:58    Scheduled Meds: . atorvastatin  40 mg Oral QHS  . calcium carbonate  1,000 mg Oral BID  . cephALEXin  500 mg Oral Q12H  . cholecalciferol  1,000 Units Oral Daily  . citalopram  20 mg Oral Daily  . donepezil  5 mg Oral QHS  . epoetin (EPOGEN/PROCRIT) injection  10,000 Units Subcutaneous Weekly  . feeding supplement  1 Container Oral TID BM  . folic acid  1 mg Oral BID  . heparin  5,000 Units Subcutaneous Q8H  . multivitamin with minerals  1 tablet Oral Daily  . mycophenolate  180 mg Oral BID  . sirolimus  3 mg Oral Daily  . sodium chloride flush  3 mL Intravenous Once  . traZODone  50 mg Oral QHS   Continuous Infusions: . sodium chloride Stopped (03/01/19 1134)  . lactated ringers 50 mL/hr at 03/03/19 2347   PRN Meds:sodium chloride, acetaminophen, ondansetron **OR** ondansetron (ZOFRAN) IV  Assessment/Plan:  Acute renal failure superimposed on stage 4 chronic  kidney disease ([place]): Creatinine creatinine improved over the past 24 hours now as urine output also improved past 24 hours Likely prerenal with severe dehydration, poor p.o. intake. - Cont to hold Lasix -Urine culture from 02/26/2019 growing Klebsiella; continue antibiotic -  Monitor renal function closely.  Avoid nephrotoxins.   - Nephrology following. Appreciate Recs  Continue calcitriol, calcium carbonate and vitamin D3.  UTI: Urine culture from 02/26/2019 growing Klebsiella -Leukocytosis resolved -Was on Rocephin from 02/27/2019; changed to Keflex 500 mg every 12 hours on 03/01/2019 complete the course of a total 10 days -Check blood culture-so far negative  Active Problems: Leukocytosis: Resolved -Likely from UTI - Could be due to severe dehydration as well.   - Monitor   -Continue antibiotic as above  Failure to thrive in adult -with severe dementia PT and nutrition consult; continue diet per nutrition recommendation    Essential hypertension: -Was running low but currently stable -Has been on Lopressor 25 mg twice daily -May resume other home antihypertensive medications as needed -Continue volume expansion with albumin per nephrology    H/O kidney transplant Continue mycophenolate and sirolimus. -Serial Lemus level to follow per nephrology -Not a candidate for dialysis yet -Hopeful patient not needing  dialysis    Dementia, severe ([facility]) Continue Aricept.  Anemia of chronic kidney disease Stable -May administer Epogen as per nephrology -To monitor  Electrolyte imbalance: Hypokalemia -Being replaced -Recheck -Started lactated Ringer  Diet: Dysphagia level 1 (PUREE w/ gravies added to moisten); Thin liquids. General aspiration precautions; Reflux precautions. Dietician f/u - recommend a Drink Supplement for ease of intake d/t Dementia, Cognitive decline. Medication Administration: Crushed with puree(as needed for safer swallowing)   Mobility: Continue  PT  Disposition: SNF when stable    LOS: 6 days   Lulu Hirschmann Izetta Dakin

## 2019-03-04 NOTE — Progress Notes (Signed)
[name], [place] 03/04/19  Subjective:   Hospital day # 6  Patient is more alert today.  However, still only oriented to self Denies pain or shortness of breath States her appetite is low today Renal: 01/17 0701 - 01/18 0700 In: 574.3 [I.V.:574.3] Out: 4004 [Urine:4004] Lab Results  Component Value Date   CREATININE 4.99 (H) 03/04/2019   CREATININE 5.33 (H) 03/03/2019   CREATININE 5.52 (H) 03/02/2019     Objective:  Vital signs in last 24 hours:  Temp:  [98.2 F (36.8 C)-98.6 F (37 C)] 98.6 F (37 C) (01/18 0347) Pulse Rate:  [79-88] 88 (01/18 0347) Resp:  [20] 20 (01/18 0347) BP: (139-153)/(64-83) 153/83 (01/18 0347) SpO2:  [95 %-96 %] 96 % (01/18 0347)  Weight change:  Filed Weights   02/26/19 1222  Weight: 90.7 kg    Intake/Output:    Intake/Output Summary (Last 24 hours) at 03/04/2019 1320 Last data filed at 03/04/2019 0400 Gross per 24 hour  Intake 574.33 ml  Output 4004 ml  Net -3429.67 ml     Physical Exam: General:  No acute distress, laying in the bed  HEENT  moist oral mucous membranes  Pulm/lungs  mild scattered rhonchi, clear to auscultation  CVS/Heart  irregular rhythm  Abdomen:   Soft, mild lower abdominal tenderness  Extremities:  No peripheral edema  Neurologic:  Alert, interactive, follows commands  Skin:  No acute rashes  Access:  Left forearm AV fistula       Basic Metabolic Panel:  Recent Labs  Lab 02/28/19 0315 02/28/19 0315 03/01/19 0847 03/01/19 0847 03/02/19 0800 03/03/19 0505 03/04/19 0524  NA 146*  --  145  --  144 142 143  K 3.8  --  3.8  --  3.3* 3.5 3.5  CL 108  --  106  --  96* 95* 97*  CO2 26  --  27  --  33* 34* 34*  GLUCOSE 137*  --  114*  --  137* 100* 103*  BUN 60*  --  61*  --  67* 67* 60*  CREATININE 4.15*  --  5.29*  --  5.52* 5.33* 4.99*  CALCIUM 7.7*   < > 7.7*   < > 7.2* 7.1* 7.3*  MG  --   --   --   --  1.9  --   --   PHOS 2.9  --  3.1  --  3.0 2.6 3.1   < > =  values in this interval not displayed.     CBC: Recent Labs  Lab 02/26/19 1242 02/26/19 1242 02/27/19 0835 02/28/19 0315 03/02/19 0000 03/02/19 0800 03/03/19 2346  WBC 17.8*  --  12.6* 11.2*  --  5.6  --   HGB 9.9*   < > 8.4* 7.7* 7.4* 7.6* 7.1*  HCT 33.5*   < > 28.7* 25.9* 24.6* 25.4* 23.9*  MCV 81.5  --  82.7 79.9*  --  80.4  --   PLT 217  --  155 167  --  166  --    < > = values in this interval not displayed.     No results found for: HEPBSAG, HEPBSAB, HEPBIGM    Microbiology:  Recent Results (from the past 240 hour(s))  SARS CORONAVIRUS 2 (TAT 6-24 HRS) Nasopharyngeal Nasopharyngeal Swab     Status: None   Collection Time: 02/26/19  2:11 PM   Specimen: Nasopharyngeal Swab  Result Value Ref Range Status   SARS Coronavirus 2 NEGATIVE  NEGATIVE Final    Comment: (NOTE) SARS-CoV-2 target nucleic acids are NOT DETECTED. The SARS-CoV-2 RNA is generally detectable in upper and lower respiratory specimens during the acute phase of infection. Negative results do not preclude SARS-CoV-2 infection, do not rule out co-infections with other pathogens, and should not be used as the sole basis for treatment or other patient management decisions. Negative results must be combined with clinical observations, patient history, and epidemiological information. The expected result is Negative. Fact Sheet for Patients: SugarRoll.be Fact Sheet for Healthcare Providers: https://www.woods-mathews.com/ This test is not yet approved or cleared by the [place] FDA and  has been authorized for detection and/or diagnosis of SARS-CoV-2 by FDA under an Emergency Use Authorization (EUA). This EUA will remain  in effect (meaning this test can be used) for the duration of the COVID-19 declaration under Section 56 4(b)(1) of the Act, 21 U.S.C. section 360bbb-3(b)(1), unless the authorization is terminated or revoked sooner. Performed at [facility], [place] [address] [place], [place] [address]   Urine culture     Status: Abnormal   Collection Time: 02/26/19 10:32 PM   Specimen: Urine, Clean Catch  Result Value Ref Range Status   Specimen Description   Final    URINE, CLEAN CATCH Performed at [facility], [address] [place], [place] [address]    Special Requests   Final    NONE Performed at [facility], [place] [address] [place], [place] [address]    Culture >=100,000 COLONIES/mL KLEBSIELLA PNEUMONIAE (A)  Final   Report Status 02/28/2019 FINAL  Final   Organism ID, Bacteria KLEBSIELLA PNEUMONIAE (A)  Final      Susceptibility   Klebsiella pneumoniae - MIC*    AMPICILLIN RESISTANT Resistant     CEFAZOLIN <=4 SENSITIVE Sensitive     CEFTRIAXONE <=0.25 SENSITIVE Sensitive     CIPROFLOXACIN <=0.25 SENSITIVE Sensitive     GENTAMICIN <=1 SENSITIVE Sensitive     IMIPENEM 0.5 SENSITIVE Sensitive     NITROFURANTOIN 128 RESISTANT Resistant     TRIMETH/SULFA <=20 SENSITIVE Sensitive     AMPICILLIN/SULBACTAM <=2 SENSITIVE Sensitive     PIP/TAZO <=4 SENSITIVE Sensitive     * >=100,000 COLONIES/mL KLEBSIELLA PNEUMONIAE  CULTURE, BLOOD (ROUTINE X 2) w Reflex to ID Panel     Status: None (Preliminary result)   Collection Time: 02/28/19 12:34 PM   Specimen: BLOOD  Result Value Ref Range Status   Specimen Description BLOOD BLOOD RIGHT HAND  Final   Special Requests   Final    BOTTLES DRAWN AEROBIC AND ANAEROBIC Blood Culture adequate volume   Culture   Final    NO GROWTH 4 DAYS Performed at [facility], [address], [place], [place] [address]    Report Status PENDING  Incomplete  CULTURE, BLOOD (ROUTINE X 2) w Reflex to ID Panel     Status: None (Preliminary result)   Collection Time: 02/28/19  1:41 PM   Specimen: BLOOD  Result Value Ref Range Status   Specimen Description BLOOD BLOOD RIGHT HAND  Final   Special Requests   Final    BOTTLES DRAWN AEROBIC AND ANAEROBIC Blood Culture adequate  volume   Culture   Final    NO GROWTH 4 DAYS Performed at [facility], [address] [place], [place] [address]    Report Status PENDING  Incomplete    Coagulation Studies: No results for input(s): LABPROT, INR in the last 72 hours.  Urinalysis:  No results for input(s): COLORURINE, LABSPEC, [place], GLUCOSEU, HGBUR, BILIRUBINUR, KETONESUR, PROTEINUR, UROBILINOGEN, NITRITE, LEUKOCYTESUR in the last 72 hours.  Invalid input(s): APPERANCEUR    Imaging: No results found.   Medications:   . sodium chloride Stopped (03/01/19 1134)  . lactated ringers 50 mL/hr at 03/03/19 2347   . atorvastatin  40 mg Oral QHS  . calcium carbonate  1,000 mg Oral BID  . cephALEXin  500 mg Oral Q12H  . cholecalciferol  1,000 Units Oral Daily  . citalopram  20 mg Oral Daily  . donepezil  5 mg Oral QHS  . epoetin (EPOGEN/PROCRIT) injection  10,000 Units Subcutaneous Weekly  . feeding supplement  1 Container Oral TID BM  . folic acid  1 mg Oral BID  . heparin  5,000 Units Subcutaneous Q8H  . metoprolol tartrate  12.5 mg Oral BID  . multivitamin with minerals  1 tablet Oral Daily  . mycophenolate  180 mg Oral BID  . sirolimus  3 mg Oral Daily  . sodium chloride flush  3 mL Intravenous Once  . traZODone  50 mg Oral QHS   sodium chloride, acetaminophen, ondansetron **OR** ondansetron (ZOFRAN) IV  Assessment/ Plan:  69 y.o. female with end-stage renal disease, deceased donor renal transplant 2009, history of renal cell carcinoma status post left nephrectomy, history of cholecystectomy, hysterectomy, anemia, secondary hyperparathyroidism, dementia, previous history of Klebsiella urinary tract infection associated sepsis in 2018 history of C. difficile colitis 2017 admitted on 02/26/2019 for Dehydration [E86.0] Leucocytosis [D72.829] Acute-on-chronic kidney injury ([place]) [N17.9, N18.9] Kidney transplant recipient [Z94.0] Acute renal failure superimposed on stage 3b chronic kidney disease,  unspecified acute renal failure type ([place]) [N17.9, N18.32]   1.  Acute renal failure -Likely secondary to ATN from concurrent infection/sepsis Serum creatinine slightly lower today at 4.99 Good urine output Electrolytes and volume status are acceptable.  We will continue to monitor closely Continue treating underlying infection  2.  Chronic kidney disease stage 4, Renal transplant status -Continued on sirolimus 3 mg daily and mycophenolate 180 mg twice a day -Baseline creatinine of 2.17/GFR 10 January 2019  3.  Urinary tract infection from Klebsiella diagnosed February 26, 2019 -Currently on oral Keflex    4.  Anemia of chronic kidney disease  Lab Results  Component Value Date   HGB 7.1 (L) 03/03/2019  - Epogen weekly (sat)  5.  Hypokalemia Maintenance lactated Ringer IV infusion.   LOS: [place] 1/18/20211:20 PM  [place], [place]  Note: This note was prepared with Dragon dictation. Any transcription errors are unintentional

## 2019-03-05 LAB — CULTURE, BLOOD (ROUTINE X 2)
Culture: NO GROWTH
Culture: NO GROWTH
Special Requests: ADEQUATE
Special Requests: ADEQUATE

## 2019-03-05 LAB — RENAL FUNCTION PANEL
Albumin: 2.1 g/dL — ABNORMAL LOW (ref 3.5–5.0)
Anion gap: 14 (ref 5–15)
BUN: 55 mg/dL — ABNORMAL HIGH (ref 8–23)
CO2: 33 mmol/L — ABNORMAL HIGH (ref 22–32)
Calcium: 7.4 mg/dL — ABNORMAL LOW (ref 8.9–10.3)
Chloride: 99 mmol/L (ref 98–111)
Creatinine, Ser: 4.57 mg/dL — ABNORMAL HIGH (ref 0.44–1.00)
GFR calc Af Amer: 11 mL/min — ABNORMAL LOW (ref >60)
GFR calc non Af Amer: 9 mL/min — ABNORMAL LOW (ref >60)
Glucose, Bld: 122 mg/dL — ABNORMAL HIGH (ref 70–99)
Phosphorus: 3.6 mg/dL (ref 2.5–4.6)
Potassium: 3.4 mmol/L — ABNORMAL LOW (ref 3.5–5.1)
Sodium: 146 mmol/L — ABNORMAL HIGH (ref 135–145)

## 2019-03-05 LAB — MAGNESIUM: Magnesium: 1.8 mg/dL (ref 1.7–2.4)

## 2019-03-05 MED ORDER — POTASSIUM CHLORIDE 10 MEQ/100ML IV SOLN
10.0000 meq | INTRAVENOUS | Status: DC
  Administered 2019-03-05: 10 meq via INTRAVENOUS
  Filled 2019-03-05: qty 100

## 2019-03-05 MED ORDER — POTASSIUM CHLORIDE 10 MEQ/100ML IV SOLN
10.0000 meq | INTRAVENOUS | Status: AC
  Administered 2019-03-05 (×3): 10 meq via INTRAVENOUS
  Filled 2019-03-05 (×2): qty 100

## 2019-03-05 NOTE — Progress Notes (Signed)
[place], [place] 03/05/19  Subjective:   Hospital day # 7  Patient is more alert today.  However, still only oriented to self Denies pain or shortness of breath Does not remember what she ate this morning Renal: 01/18 0701 - 01/19 0700 In: 685.2 [I.V.:685.2] Out: 600 [Urine:600] Lab Results  Component Value Date   CREATININE 4.57 (H) 03/05/2019   CREATININE 4.99 (H) 03/04/2019   CREATININE 5.33 (H) 03/03/2019     Objective:  Vital signs in last 24 hours:  Temp:  [98.5 F (36.9 C)-99.8 F (37.7 C)] 98.5 F (36.9 C) (01/19 0530) Pulse Rate:  [62-75] 62 (01/19 0831) Resp:  [18-20] 18 (01/19 0530) BP: (116-157)/(51-87) 157/66 (01/19 0831) SpO2:  [97 %-100 %] 98 % (01/19 0831)  Weight change:  Filed Weights   02/26/19 1222  Weight: 90.7 kg    Intake/Output:    Intake/Output Summary (Last 24 hours) at 03/05/2019 0943 Last data filed at 03/05/2019 0555 Gross per 24 hour  Intake 685.2 ml  Output 600 ml  Net 85.2 ml     Physical Exam: General:  No acute distress, laying in the bed  HEENT  moist oral mucous membranes  Pulm/lungs  mild scattered rhonchi, clear to auscultation  CVS/Heart  irregular rhythm  Abdomen:   Soft, mild lower abdominal tenderness  Extremities:  No peripheral edema  Neurologic:  Alert, interactive, follows commands  Skin:  No acute rashes  Access:  Left forearm AV fistula       Basic Metabolic Panel:  Recent Labs  Lab 03/01/19 0847 03/01/19 0847 03/02/19 0800 03/02/19 0800 03/03/19 0505 03/04/19 0524 03/05/19 0425  NA 145  --  144  --  142 143 146*  K 3.8  --  3.3*  --  3.5 3.5 3.4*  CL 106  --  96*  --  95* 97* 99  CO2 27  --  33*  --  34* 34* 33*  GLUCOSE 114*  --  137*  --  100* 103* 122*  BUN 61*  --  67*  --  67* 60* 55*  CREATININE 5.29*  --  5.52*  --  5.33* 4.99* 4.57*  CALCIUM 7.7*   < > 7.2*   < > 7.1* 7.3* 7.4*  MG  --   --  1.9  --   --   --  1.8  PHOS 3.1  --  3.0  --  2.6 3.1  3.6   < > = values in this interval not displayed.     CBC: Recent Labs  Lab 02/26/19 1242 02/26/19 1242 02/27/19 0835 02/28/19 0315 03/02/19 0000 03/02/19 0800 03/03/19 2346  WBC 17.8*  --  12.6* 11.2*  --  5.6  --   HGB 9.9*   < > 8.4* 7.7* 7.4* 7.6* 7.1*  HCT 33.5*   < > 28.7* 25.9* 24.6* 25.4* 23.9*  MCV 81.5  --  82.7 79.9*  --  80.4  --   PLT 217  --  155 167  --  166  --    < > = values in this interval not displayed.     No results found for: HEPBSAG, HEPBSAB, HEPBIGM    Microbiology:  Recent Results (from the past 240 hour(s))  SARS CORONAVIRUS 2 (TAT 6-24 HRS) Nasopharyngeal Nasopharyngeal Swab     Status: None   Collection Time: 02/26/19  2:11 PM   Specimen: Nasopharyngeal Swab  Result Value Ref Range Status   SARS Coronavirus 2 NEGATIVE  NEGATIVE Final    Comment: (NOTE) SARS-CoV-2 target nucleic acids are NOT DETECTED. The SARS-CoV-2 RNA is generally detectable in upper and lower respiratory specimens during the acute phase of infection. Negative results do not preclude SARS-CoV-2 infection, do not rule out co-infections with other pathogens, and should not be used as the sole basis for treatment or other patient management decisions. Negative results must be combined with clinical observations, patient history, and epidemiological information. The expected result is Negative. Fact Sheet for Patients: SugarRoll.be Fact Sheet for Healthcare Providers: https://www.woods-mathews.com/ This test is not yet approved or cleared by the [place] FDA and  has been authorized for detection and/or diagnosis of SARS-CoV-2 by FDA under an Emergency Use Authorization (EUA). This EUA will remain  in effect (meaning this test can be used) for the duration of the COVID-19 declaration under Section 56 4(b)(1) of the Act, 21 U.S.C. section 360bbb-3(b)(1), unless the authorization is terminated or revoked sooner. Performed at  [facility], [place] [address] [place], [place] [address]   Urine culture     Status: Abnormal   Collection Time: 02/26/19 10:32 PM   Specimen: Urine, Clean Catch  Result Value Ref Range Status   Specimen Description   Final    URINE, CLEAN CATCH Performed at [facility], [address], [place], [place] [address]    Special Requests   Final    NONE Performed at [facility], [place] [name], [place], [place] [address]    Culture >=100,000 COLONIES/mL KLEBSIELLA PNEUMONIAE (A)  Final   Report Status 02/28/2019 FINAL  Final   Organism ID, Bacteria KLEBSIELLA PNEUMONIAE (A)  Final      Susceptibility   Klebsiella pneumoniae - MIC*    AMPICILLIN RESISTANT Resistant     CEFAZOLIN <=4 SENSITIVE Sensitive     CEFTRIAXONE <=0.25 SENSITIVE Sensitive     CIPROFLOXACIN <=0.25 SENSITIVE Sensitive     GENTAMICIN <=1 SENSITIVE Sensitive     IMIPENEM 0.5 SENSITIVE Sensitive     NITROFURANTOIN 128 RESISTANT Resistant     TRIMETH/SULFA <=20 SENSITIVE Sensitive     AMPICILLIN/SULBACTAM <=2 SENSITIVE Sensitive     PIP/TAZO <=4 SENSITIVE Sensitive     * >=100,000 COLONIES/mL KLEBSIELLA PNEUMONIAE  CULTURE, BLOOD (ROUTINE X 2) w Reflex to ID Panel     Status: None   Collection Time: 02/28/19 12:34 PM   Specimen: BLOOD  Result Value Ref Range Status   Specimen Description BLOOD BLOOD RIGHT HAND  Final   Special Requests   Final    BOTTLES DRAWN AEROBIC AND ANAEROBIC Blood Culture adequate volume   Culture   Final    NO GROWTH 5 DAYS Performed at [facility], [address] [place], [place] [address]    Report Status 03/05/2019 FINAL  Final  CULTURE, BLOOD (ROUTINE X 2) w Reflex to ID Panel     Status: None   Collection Time: 02/28/19  1:41 PM   Specimen: BLOOD  Result Value Ref Range Status   Specimen Description BLOOD BLOOD RIGHT HAND  Final   Special Requests   Final    BOTTLES DRAWN AEROBIC AND ANAEROBIC Blood Culture adequate volume   Culture   Final     NO GROWTH 5 DAYS Performed at [facility], [address] [place], [place] [address]    Report Status 03/05/2019 FINAL  Final    Coagulation Studies: No results for input(s): LABPROT, INR in the last 72 hours.  Urinalysis: No results  for input(s): COLORURINE, LABSPEC, [place], GLUCOSEU, HGBUR, BILIRUBINUR, KETONESUR, PROTEINUR, UROBILINOGEN, NITRITE, LEUKOCYTESUR in the last 72 hours.  Invalid input(s): APPERANCEUR    Imaging: No results found.   Medications:    sodium chloride Stopped (03/01/19 1134)   lactated ringers 50 mL/hr at 03/04/19 2223   potassium chloride      atorvastatin  40 mg Oral QHS   calcium carbonate  1,000 mg Oral BID   cephALEXin  500 mg Oral Q12H   cholecalciferol  1,000 Units Oral Daily   citalopram  20 mg Oral Daily   donepezil  5 mg Oral QHS   epoetin (EPOGEN/PROCRIT) injection  10,000 Units Subcutaneous Weekly   feeding supplement  1 Container Oral TID BM   folic acid  1 mg Oral BID   heparin  5,000 Units Subcutaneous Q8H   metoprolol tartrate  12.5 mg Oral BID   multivitamin with minerals  1 tablet Oral Daily   mycophenolate  180 mg Oral BID   sirolimus  3 mg Oral Daily   sodium chloride flush  3 mL Intravenous Once   traZODone  50 mg Oral QHS   sodium chloride, acetaminophen, ondansetron **OR** ondansetron (ZOFRAN) IV  Assessment/ Plan:  69 y.o. female with end-stage renal disease, deceased donor renal transplant 2009, history of renal cell carcinoma status post left nephrectomy, history of cholecystectomy, hysterectomy, anemia, secondary hyperparathyroidism, dementia, previous history of Klebsiella urinary tract infection associated sepsis in 2018 history of C. difficile colitis 2017 admitted on 02/26/2019 for Dehydration [E86.0] Leucocytosis [D72.829] Acute-on-chronic kidney injury ([place]) [N17.9, N18.9] Kidney transplant recipient [Z94.0] Acute renal failure superimposed on stage 3b chronic kidney disease, unspecified acute  renal failure type ([place]) [N17.9, N18.32]   1.  Acute renal failure -Likely secondary to ATN from concurrent infection/sepsis- High WBC, Altered mental status and Documented UTI, immunocompromised patient Serum creatinine slightly lower today at 4.57 Good urine output Electrolytes and volume status are acceptable.  We will continue to monitor closely Continue treating underlying infection  2.  Chronic kidney disease stage 4, Renal transplant status -Continued on sirolimus 3 mg daily and mycophenolate 180 mg twice a day -Baseline creatinine of 2.17/GFR 10 January 2019  3.  Urinary tract infection from Klebsiella diagnosed February 26, 2019 -Currently on oral Keflex    4.  Anemia of chronic kidney disease  Lab Results  Component Value Date   HGB 7.1 (L) 03/03/2019  - Epogen weekly (sat)  5.  Hypokalemia Maintenance lactated Ringer IV infusion. Encouraged oral intake   LOS: 7 [name] 1/19/20219:43 AM  [place], [place]  Note: This note was prepared with Dragon dictation. Any transcription errors are unintentional

## 2019-03-05 NOTE — Progress Notes (Signed)
Physical Therapy Treatment Patient Details Name: [name] MRN: [id number] DOB: August 08, 1950 Today's Date: 03/05/2019    History of Present Illness Per MD H&P: Pt is a 69 y.o. female, with history of chronic kidney disease stage IV, history of renal transplant in 2019 on immunosuppression, anemia of chronic disease, secondary hyperparathyroidism, essential hypertension, history of C. difficile colitis who was hospitalized 3 weeks back for acute on chronic kidney disease stage IV thought to be prerenal with dehydration and improved with IV fluids.  She was discharged to SNF from where she returned home 1 week back. As per daughter patient has not been eating or drinking after returning from SNF, and has been progressively weak.  MD assessment includes: Acute renal failure superimposed on stage 4 chronic kidney disease, leukocytosis, HTN, FTT, anemia of chronic disease, and severe dementia.    PT Comments    Session limited to therex only this date secondary to time constraints.  Pt actively participated occasionally during the session but only with max verbal and tactile cues for technique.  Pt's SpO2 and HR WNL with no adverse symptoms noted.  Pt will benefit from PT services in a SNF setting upon discharge to safely address deficits listed in patient problem list for decreased caregiver assistance and eventual return to PLOF.    Follow Up Recommendations  SNF     Equipment Recommendations  None recommended by PT    Recommendations for Other Services       Precautions / Restrictions Precautions Precautions: Fall Restrictions Weight Bearing Restrictions: No    Mobility  Bed Mobility               General bed mobility comments: NT this session  Transfers                 General transfer comment: NT this session  Ambulation/Gait                 Stairs             Wheelchair Mobility    Modified Rankin (Stroke Patients Only)       Balance                                             Cognition Arousal/Alertness: Awake/alert Behavior During Therapy: WFL for tasks assessed/performed Overall Cognitive Status: No family/caregiver present to determine baseline cognitive functioning                                        Exercises Total Joint Exercises Ankle Circles/Pumps: AAROM;Both;10 reps;15 reps Short Arc Quad: AAROM;Both;10 reps;15 reps Heel Slides: AAROM;Both;10 reps;15 reps Hip ABduction/ADduction: AAROM;Both;10 reps;15 reps Straight Leg Raises: AAROM;Both;10 reps;15 reps Other Exercises Other Exercises: BLE supine hip, knee, ankle AAROM in available planes with max tactile and verbal cues for technique    General Comments        Pertinent Vitals/Pain Pain Assessment: No/denies pain    Home Living                      Prior Function            PT Goals (current goals can now be found in the care plan section)      Frequency    Min  2X/week      PT Plan Current plan remains appropriate    Co-evaluation              AM-PAC PT "6 Clicks" Mobility   Outcome Measure  Help needed turning from your back to your side while in a flat bed without using bedrails?: Total Help needed moving from lying on your back to sitting on the side of a flat bed without using bedrails?: Total Help needed moving to and from a bed to a chair (including a wheelchair)?: Total Help needed standing up from a chair using your arms (e.g., wheelchair or bedside chair)?: Total Help needed to walk in hospital room?: Total Help needed climbing 3-5 steps with a railing? : Total 6 Click Score: 6    End of Session   Activity Tolerance: Patient tolerated treatment well Patient left: in bed;with call bell/phone within reach;with bed alarm set;with nursing/sitter in room Nurse Communication: Mobility status PT Visit Diagnosis: Unsteadiness on feet (R26.81);Muscle weakness (generalized)  (M62.81);Difficulty in walking, not elsewhere classified (R26.2)     Time: 0488-8916 PT Time Calculation (min) (ACUTE ONLY): 15 min  Charges:  $Therapeutic Exercise: 8-22 mins                     D. Royetta Asal PT, DPT 03/05/19, 4:39 PM

## 2019-03-05 NOTE — Progress Notes (Signed)
Nutrition Follow Up Note   DOCUMENTATION CODES:   Obesity unspecified  INTERVENTION:   Boost Breeze po TID, each supplement provides 250 kcal and 9 grams of protein  MVI daily  NUTRITION DIAGNOSIS:   Inadequate oral intake related to acute illness as evidenced by per patient/family report.  GOAL:   Patient will meet greater than or equal to 90% of their needs  -progressing   MONITOR:   PO intake, Supplement acceptance, Labs, Weight trends, Skin, I & O's  ASSESSMENT:   69 y.o. female, with history of dementia, chronic kidney disease stage IV, history of renal transplant in 2019 on immunosuppression, anemia of chronic disease, secondary hyperparathyroidism, essential hypertension, history of C. difficile colitis who was hospitalized 3 weeks back for acute on chronic kidney disease stage IV, she was discharged to SNF and returned home 1 week ago and has not been eating since returning home.   Pt with improved appetite and oral intake; pt eating 25-75% of meals today and drinking Boost Breeze. Per MD note, pt is more alert today but only oriented to self. Recommend continue supplements and vitamins. Refeed labs stable. No new weight since admit; will request weekly weights.   Medications reviewed and include: tums, cephalexin, D3, celexa, epoetin, folic acid, heparin, MVI, LRS @50ml /hr  Labs reviewed: Na 146(H), K 3.4 wnl, BUN 55(H), creat 4.57(H), P 3.6 wnl, Mg 1.8 wnl Hgb 7.1(L), Hct 23.9(L)  Diet Order:   Diet Order            DIET - DYS 1 Room service appropriate? Yes with Assist; Fluid consistency: Thin  Diet effective now             EDUCATION NEEDS:   Not appropriate for education at this time  Skin:  Skin Assessment: Reviewed RN Assessment  Last BM:  1/18- type 6  Height:   Ht Readings from Last 1 Encounters:  02/26/19 4\' 11"  (1.499 m)    Weight:   Wt Readings from Last 1 Encounters:  02/26/19 90.7 kg    Ideal Body Weight:  44.5 kg  BMI:  Body  mass index is 40.4 kg/m.  Estimated Nutritional Needs:   Kcal:  1700-1900kcal/day  Protein:  75-85g/day  Fluid:  >1.3L/day  Koleen Distance MS, RD, LDN Pager #- [phone] Office#- [phone] After Hours Pager: [phone]

## 2019-03-05 NOTE — Care Management Important Message (Signed)
Important Message  Patient Details  Name: [name] MRN: [id number] Date of Birth: 1950/06/03   Medicare Important Message Given:  Yes  Left message with daughter with reminder of Medicare IM right.  Encouraged her to return call if she did not receive electronic copy emailed securely on 1/15 or if she had any further questions from our previous conversation.         Dannette Barbara 03/05/2019, 11:09 AM

## 2019-03-05 NOTE — Progress Notes (Signed)
Brief Hx: [name]  is a 69 y.o. female, with history of chronic kidney disease stage IV, history of renal transplant in 2019 on immunosuppression, anemia of chronic disease, secondary hyperparathyroidism, essential hypertension, history of C. difficile colitis who was hospitalized 3 weeks back for acute on chronic kidney disease stage IV thought to be prerenal with dehydration and improved with IV fluids.  She was discharged to SNF from where she returned home 1 week back. As per daughter patient has not been eating or drinking after returning from SNF, and has been progressively weak.  Course in the ED Vitals stable.  Blood work showed significant leukocytosis (17.8k), hemoglobin of 9.9, BUN of 52 and creatinine of 3.56 (baseline of 1.7).  Glucose of 174.  Albumin of 2.9, LFTs otherwise normal.  POC Covid 19 was negative, PCR pending.  Subjective: Was alert.  Having breakfast with aide.  Objective: Vital signs in last 24 hours: Temp:  [98.5 F (36.9 C)-99.8 F (37.7 C)] 98.5 F (36.9 C) (01/19 0530) Pulse Rate:  [62-75] 62 (01/19 0831) Resp:  [18-20] 18 (01/19 0530) BP: (116-157)/(51-87) 157/66 (01/19 0831) SpO2:  [97 %-100 %] 98 % (01/19 0831)  Intake/Output from previous day: 01/18 0701 - 01/19 0700 In: 685.2 [I.V.:685.2] Out: 600 [Urine:600] Intake/Output this shift: No intake/output data recorded.   General: Elderly female lying in bed in no acute distress, alert but not oriented HEENT: Pupils reactive bilaterally, EOMI, pallor present, no icterus, moist mucosa; supple neck, no cervical pain with Chest: Clear to auscultation bilaterally CVS: Normal S1-S2, no murmurs rub or gallop GI: Soft, nondistended, nontender, bowel sounds present Musculoskeletal: Warm, no edema CNS: Alert and awake, oriented x0, nonfocal  Results for orders placed or performed during the hospital encounter of 02/26/19 (from the past 24 hour(s))  Renal function panel     Status: Abnormal   Collection Time: 03/05/19  4:25 AM  Result Value Ref Range   Sodium 146 (H) 135 - 145 mmol/L   Potassium 3.4 (L) 3.5 - 5.1 mmol/L   Chloride 99 98 - 111 mmol/L   CO2 33 (H) 22 - 32 mmol/L   Glucose, Bld 122 (H) 70 - 99 mg/dL   BUN 55 (H) 8 - 23 mg/dL   Creatinine, Ser 4.57 (H) 0.44 - 1.00 mg/dL   Calcium 7.4 (L) 8.9 - 10.3 mg/dL   Phosphorus 3.6 2.5 - 4.6 mg/dL   Albumin 2.1 (L) 3.5 - 5.0 g/dL   GFR calc non Af Amer 9 (L) >60 mL/min   GFR calc Af Amer 11 (L) >60 mL/min   Anion gap 14 5 - 15  Magnesium     Status: None   Collection Time: 03/05/19  4:25 AM  Result Value Ref Range   Magnesium 1.8 1.7 - 2.4 mg/dL    Studies/Results: CT ABDOMEN PELVIS WO CONTRAST  Result Date: 02/26/2019 CLINICAL DATA:  Acute generalized abdominal pain, nausea, vomiting. EXAM: CT ABDOMEN AND PELVIS WITHOUT CONTRAST TECHNIQUE: Multidetector CT imaging of the abdomen and pelvis was performed following the standard protocol without IV contrast. COMPARISON:  November 30, 2018. FINDINGS: Lower chest: Small pericardial effusion. Visualized lung bases are unremarkable. Hepatobiliary: No focal liver abnormality is seen. Status post cholecystectomy. No biliary dilatation. Pancreas: Unremarkable. No pancreatic ductal dilatation or surrounding inflammatory changes. Spleen: Normal in size without focal abnormality. Adrenals/Urinary Tract: Adrenal glands are unremarkable. Severe right renal atrophy is noted consistent with end-stage renal disease. Status post left nephrectomy. No hydronephrosis or renal obstruction is noted. Renal  transplant is noted in right lower quadrant without hydronephrosis. Urinary bladder is unremarkable. Stomach/Bowel: The stomach appears normal. There is no evidence of bowel obstruction or inflammation. The appendix is not visualized. Vascular/Lymphatic: Aortic atherosclerosis. No enlarged abdominal or pelvic lymph nodes. Reproductive: Status post hysterectomy. No adnexal masses. Other: No abdominal  wall hernia or abnormality. No abdominopelvic ascites. 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 No acute osseous abnormality is noted. IMPRESSION: 1. Small pericardial effusion. 2. Severe right renal atrophy consistent with end-stage renal disease. 3. Status post left nephrectomy and right lower quadrant renal transplant. 4. 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 5. Aortic atherosclerosis. Aortic Atherosclerosis (ICD10-I70.0). Electronically Signed   By: Marijo Conception M.D.   On: 02/26/2019 14:28   DG Chest Port 1 View  Result Date: 02/26/2019 CLINICAL DATA:  Weakness. EXAM: PORTABLE CHEST 1 VIEW COMPARISON:  September 22, 2018. FINDINGS: Stable cardiomegaly. No pneumothorax or pleural effusion is noted. Both lungs are clear. The visualized skeletal structures are unremarkable. IMPRESSION: No active disease. Electronically Signed   By: Marijo Conception M.D.   On: 02/26/2019 16:58    Scheduled Meds: . atorvastatin  40 mg Oral QHS  . calcium carbonate  1,000 mg Oral BID  . cephALEXin  500 mg Oral Q12H  . cholecalciferol  1,000 Units Oral Daily  . citalopram  20 mg Oral Daily  . donepezil  5 mg Oral QHS  . epoetin (EPOGEN/PROCRIT) injection  10,000 Units Subcutaneous Weekly  . feeding supplement  1 Container Oral TID BM  . folic acid  1 mg Oral BID  . heparin  5,000 Units Subcutaneous Q8H  . metoprolol tartrate  12.5 mg Oral BID  . multivitamin with minerals  1 tablet Oral Daily  . mycophenolate  180 mg Oral BID  . sirolimus  3 mg Oral Daily  . sodium chloride flush  3 mL Intravenous Once  . traZODone  50 mg Oral QHS   Continuous Infusions: . sodium chloride Stopped (03/01/19 1134)  . lactated ringers 50 mL/hr at 03/04/19 2223  . potassium chloride 10 mEq (03/05/19 1105)   PRN Meds:sodium chloride, acetaminophen, ondansetron **OR** ondansetron (ZOFRAN) IV  Assessment/Plan:  Acute renal failure superimposed on stage 4 chronic kidney  disease ([place]): Creatinine creatinine improved over the past 24 hours now as urine output also improved past 24 hours Likely prerenal with severe dehydration, poor p.o. intake. - Cont to hold Lasix -Urine culture from 02/26/2019 growing Klebsiella; continue antibiotic -  Monitor renal function closely.  Avoid nephrotoxins -Good urine output over the past 24 hours and creatinine slowly improving   - Nephrology following. Appreciate Recs  Continue calcitriol, calcium carbonate and vitamin D3. -Continues to need close nephrology follow-up at this time  UTI: Urine culture from 02/26/2019 growing Klebsiella -Leukocytosis resolved -Was on Rocephin from 02/27/2019; changed to Keflex 500 mg every 12 hours on 03/01/2019 complete the course of a total 10 days (0/48/88) for complicated UTI - blood culture-so far negative  Active Problems: Leukocytosis: Resolved -Likely from UTI - Could be due to severe dehydration as well.   - Monitor   -Continue antibiotic as above  Failure to thrive in adult -with severe dementia PT and nutrition consult; continue diet per nutrition recommendation    Essential hypertension: -Was running low but currently stable -Has been on Lopressor 25 mg twice daily -May resume other home antihypertensive medications as needed -Continue volume expansion with albumin per nephrology  H/O kidney transplant Continue mycophenolate and sirolimus. -Serial Lemus level to follow per nephrology -Not a candidate for dialysis yet -Hopeful patient not needing dialysis    Dementia, severe ([facility]) Continue Aricept.  Anemia of chronic kidney disease Stable -May administer Epogen as per nephrology -To monitor  Electrolyte imbalance: Hypokalemia -Being replaced -Recheck -Started lactated Ringer  Diet: Dysphagia level 1 (PUREE w/ gravies added to moisten); Thin liquids. General aspiration precautions; Reflux precautions. Dietician f/u - recommend a Drink Supplement for ease  of intake d/t Dementia, Cognitive decline. Medication Administration: Crushed with puree(as needed for safer swallowing)   Mobility: Continue PT  Disposition: SNF when stable    LOS: 7 days   Ninnie Fein Izetta Dakin

## 2019-03-06 LAB — RENAL FUNCTION PANEL
Albumin: 2.2 g/dL — ABNORMAL LOW (ref 3.5–5.0)
Anion gap: 11 (ref 5–15)
BUN: 46 mg/dL — ABNORMAL HIGH (ref 8–23)
CO2: 31 mmol/L (ref 22–32)
Calcium: 7.6 mg/dL — ABNORMAL LOW (ref 8.9–10.3)
Chloride: 103 mmol/L (ref 98–111)
Creatinine, Ser: 3.77 mg/dL — ABNORMAL HIGH (ref 0.44–1.00)
GFR calc Af Amer: 13 mL/min — ABNORMAL LOW (ref >60)
GFR calc non Af Amer: 12 mL/min — ABNORMAL LOW (ref >60)
Glucose, Bld: 103 mg/dL — ABNORMAL HIGH (ref 70–99)
Phosphorus: 3.7 mg/dL (ref 2.5–4.6)
Potassium: 4 mmol/L (ref 3.5–5.1)
Sodium: 145 mmol/L (ref 135–145)

## 2019-03-06 LAB — HEMOGLOBIN AND HEMATOCRIT, BLOOD
HCT: 24.1 % — ABNORMAL LOW (ref 36.0–46.0)
Hemoglobin: 7.2 g/dL — ABNORMAL LOW (ref 12.0–15.0)

## 2019-03-06 MED ORDER — POTASSIUM CHLORIDE ER 10 MEQ PO TBCR: EXTENDED_RELEASE_TABLET | ORAL | Status: AC

## 2019-03-06 MED ORDER — AMLODIPINE BESYLATE 5 MG PO TABS
5.0000 mg | ORAL_TABLET | Freq: Every day | ORAL | Status: DC
  Administered 2019-03-06 – 2019-03-07 (×2): 5 mg via ORAL
  Filled 2019-03-06 (×2): qty 1

## 2019-03-06 MED ORDER — METOPROLOL TARTRATE 25 MG PO TABS
25.0000 mg | ORAL_TABLET | Freq: Two times a day (BID) | ORAL | Status: DC
  Filled 2019-03-06 (×3): qty 1

## 2019-03-06 MED ORDER — CEPHALEXIN 500 MG PO CAPS: 500.0000 mg | ORAL_CAPSULE | Freq: Two times a day (BID) | ORAL | 0 refills | Status: AC

## 2019-03-06 MED ORDER — FUROSEMIDE 20 MG PO TABS: ORAL_TABLET | ORAL | 0 refills | Status: AC

## 2019-03-06 MED ORDER — AMLODIPINE BESYLATE 5 MG PO TABS: 5.0000 mg | ORAL_TABLET | Freq: Every day | ORAL | 0 refills | Status: AC

## 2019-03-06 NOTE — Progress Notes (Addendum)
Brief Hx: [name]  is a 69 y.o. female, with history of chronic kidney disease stage IV, history of renal transplant in 2019 on immunosuppression, anemia of chronic disease, secondary hyperparathyroidism, essential hypertension, history of C. difficile colitis who was hospitalized 3 weeks back for acute on chronic kidney disease stage IV thought to be prerenal with dehydration and improved with IV fluids.  She was discharged to SNF from where she returned home 1 week back. As per daughter patient has not been eating or drinking after returning from SNF, and has been progressively weak.  Course in the ED Vitals stable.  Blood work showed significant leukocytosis (17.8k), hemoglobin of 9.9, BUN of 52 and creatinine of 3.56 (baseline of 1.7).  Glucose of 174.  Albumin of 2.9, LFTs otherwise normal.  POC Covid 19 was negative, PCR pending.  Subjective: Pt answered all ROS questions, No fever, dyspnea, chest pain, abdominal pain, N/V, dysuria.     Objective: Vital signs in last 24 hours: Temp:  [98.6 F (37 C)-99.1 F (37.3 C)] 98.7 F (37.1 C) (01/20 1322) Pulse Rate:  [65-110] 67 (01/20 1707) Resp:  [16-20] 16 (01/20 1322) BP: (129-190)/(49-95) 129/49 (01/20 1707) SpO2:  [98 %-100 %] 100 % (01/20 1322)  Intake/Output from previous day: 01/19 0701 - 01/20 0700 In: 2395.8 [P.O.:615; I.V.:1558.8] Out: 250 [Urine:250] Intake/Output this shift: No intake/output data recorded.  Constitutional: NAD, alert, oriented to self, responsive, but frequently confused HEENT: conjunctivae and lids normal, EOMI CV: RRR no M,R,G. Distal pulses +2.  No cyanosis.   RESP: CTA B/L, normal respiratory effort, on RA GI: +BS, NTND Extremities: No effusions, edema, or tenderness in BLE SKIN: warm, dry and intact Neuro: II - XII grossly intact.  Sensation intact Psych: Normal mood and affect.     Results for orders placed or performed during the hospital encounter of 02/26/19 (from the past 24  hour(s))  Hemoglobin and hematocrit, blood     Status: Abnormal   Collection Time: 03/06/19 12:09 AM  Result Value Ref Range   Hemoglobin 7.2 (L) 12.0 - 15.0 g/dL   HCT 24.1 (L) 36.0 - 46.0 %  Renal function panel     Status: Abnormal   Collection Time: 03/06/19  5:58 AM  Result Value Ref Range   Sodium 145 135 - 145 mmol/L   Potassium 4.0 3.5 - 5.1 mmol/L   Chloride 103 98 - 111 mmol/L   CO2 31 22 - 32 mmol/L   Glucose, Bld 103 (H) 70 - 99 mg/dL   BUN 46 (H) 8 - 23 mg/dL   Creatinine, Ser 3.77 (H) 0.44 - 1.00 mg/dL   Calcium 7.6 (L) 8.9 - 10.3 mg/dL   Phosphorus 3.7 2.5 - 4.6 mg/dL   Albumin 2.2 (L) 3.5 - 5.0 g/dL   GFR calc non Af Amer 12 (L) >60 mL/min   GFR calc Af Amer 13 (L) >60 mL/min   Anion gap 11 5 - 15    Studies/Results: CT ABDOMEN PELVIS WO CONTRAST  Result Date: 02/26/2019 CLINICAL DATA:  Acute generalized abdominal pain, nausea, vomiting. EXAM: CT ABDOMEN AND PELVIS WITHOUT CONTRAST TECHNIQUE: Multidetector CT imaging of the abdomen and pelvis was performed following the standard protocol without IV contrast. COMPARISON:  November 30, 2018. FINDINGS: Lower chest: Small pericardial effusion. Visualized lung bases are unremarkable. Hepatobiliary: No focal liver abnormality is seen. Status post cholecystectomy. No biliary dilatation. Pancreas: Unremarkable. No pancreatic ductal dilatation or surrounding inflammatory changes. Spleen: Normal in size without focal abnormality. Adrenals/Urinary Tract:  Adrenal glands are unremarkable. Severe right renal atrophy is noted consistent with end-stage renal disease. Status post left nephrectomy. No hydronephrosis or renal obstruction is noted. Renal transplant is noted in right lower quadrant without hydronephrosis. Urinary bladder is unremarkable. Stomach/Bowel: The stomach appears normal. There is no evidence of bowel obstruction or inflammation. The appendix is not visualized. Vascular/Lymphatic: Aortic atherosclerosis. No enlarged  abdominal or pelvic lymph nodes. Reproductive: Status post hysterectomy. No adnexal masses. Other: No abdominal wall hernia or abnormality. No abdominopelvic ascites. 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 No acute osseous abnormality is noted. IMPRESSION: 1. Small pericardial effusion. 2. Severe right renal atrophy consistent with end-stage renal disease. 3. Status post left nephrectomy and right lower quadrant renal transplant. 4. 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 5. Aortic atherosclerosis. Aortic Atherosclerosis (ICD10-I70.0). Electronically Signed   By: Marijo Conception M.D.   On: 02/26/2019 14:28   DG Chest Port 1 View  Result Date: 02/26/2019 CLINICAL DATA:  Weakness. EXAM: PORTABLE CHEST 1 VIEW COMPARISON:  September 22, 2018. FINDINGS: Stable cardiomegaly. No pneumothorax or pleural effusion is noted. Both lungs are clear. The visualized skeletal structures are unremarkable. IMPRESSION: No active disease. Electronically Signed   By: Marijo Conception M.D.   On: 02/26/2019 16:58    Scheduled Meds: . amLODipine  5 mg Oral Daily  . atorvastatin  40 mg Oral QHS  . calcium carbonate  1,000 mg Oral BID  . cephALEXin  500 mg Oral Q12H  . cholecalciferol  1,000 Units Oral Daily  . citalopram  20 mg Oral Daily  . donepezil  5 mg Oral QHS  . epoetin (EPOGEN/PROCRIT) injection  10,000 Units Subcutaneous Weekly  . feeding supplement  1 Container Oral TID BM  . folic acid  1 mg Oral BID  . heparin  5,000 Units Subcutaneous Q8H  . metoprolol tartrate  25 mg Oral BID  . multivitamin with minerals  1 tablet Oral Daily  . mycophenolate  180 mg Oral BID  . sirolimus  3 mg Oral Daily  . sodium chloride flush  3 mL Intravenous Once  . traZODone  50 mg Oral QHS   Continuous Infusions: . sodium chloride Stopped (03/01/19 1134)   PRN Meds:sodium chloride, acetaminophen, ondansetron **OR** ondansetron (ZOFRAN) IV  Assessment/Plan:  Acute  renal failure -Baseline creatinine of 2.17/GFR 10 January 2019.  Likely secondary to ATN from concurrent infection/sepsis- High WBC, Altered mental status and Documented UTI, immunocompromised patient Serum creatinine slightly lower today at 3.8.  Good urine output. --continue to trend Cr --treat underlying infection --Hold home lasix --d/c MIVF in preparation for d/c  Chronic kidney disease stage 4, Renal transplant status -Continued on sirolimus 3 mg daily and mycophenolate 180 mg twice a day  Urinary tract infection from Klebsiella diagnosed February 26, 2019 -Was on Rocephin from 02/27/2019; changed to Keflex 500 mg every 12 hours on 03/01/2019 complete the course of a total 10 days (5/63/14) for complicated UTI  Active Problems: Leukocytosis: Resolved -Likely from UTI - Could be due to severe dehydration as well.   - Monitor   -Continue antibiotic as above  Failure to thrive in adult -with severe dementia PT and nutrition consult; continue diet per nutrition recommendation    Essential hypertension:  -Was running low but now high -resume home dose of Lopressor 25 mg twice daily --Hold home lasix --Start amlodipine 5 mg daily --follow up with nephrology outpatient    Dementia, severe ([place]) Continue  Aricept.  Anemia of chronic kidney disease Stable -May administer Epogen as per nephrology -To monitor  Electrolyte imbalance: Hypokalemia -Being replaced   Diet: Dysphagia level 1 (PUREE w/ gravies added to moisten); Thin liquids. General aspiration precautions; Reflux precautions. Dietician f/u - recommend a Drink Supplement for ease of intake d/t Dementia, Cognitive decline. Medication Administration: Crushed with puree(as needed for safer swallowing)  Family Communication:  Daughter updated on the phone today Disposition: PT rec SNF, but daughter wants to take pt home tomorrow, with HHPT   LOS: 8 days   Enzo Bi

## 2019-03-06 NOTE — Progress Notes (Signed)
[place], [place] 03/06/19  Subjective:   Hospital day # 8  Patient is alert, interactive but only oriented to self Denies pain or shortness of breath Does not remember what she ate this morning for breakfast Renal: 01/19 0701 - 01/20 0700 In: 2395.8 [P.O.:615; I.V.:1558.8; IV Piggyback:222] Out: 250 [Urine:250] Lab Results  Component Value Date   CREATININE 3.77 (H) 03/06/2019   CREATININE 4.57 (H) 03/05/2019   CREATININE 4.99 (H) 03/04/2019     Objective:  Vital signs in last 24 hours:  Temp:  [98.6 F (37 C)-99.1 F (37.3 C)] 98.6 F (37 C) (01/20 0531) Pulse Rate:  [66-110] 85 (01/20 0531) Resp:  [16-20] 16 (01/20 0531) BP: (142-183)/(79-90) 146/82 (01/20 0531) SpO2:  [98 %-100 %] 98 % (01/20 0531) Weight:  [90 kg] 90 kg (01/19 1529)  Weight change:  Filed Weights   02/26/19 1222 03/05/19 1529  Weight: 90.7 kg 90 kg    Intake/Output:    Intake/Output Summary (Last 24 hours) at 03/06/2019 0917 Last data filed at 03/06/2019 0329 Gross per 24 hour  Intake 2195.78 ml  Output 250 ml  Net 1945.78 ml     Physical Exam: General:  No acute distress, laying in the bed  HEENT  moist oral mucous membranes  Pulm/lungs  mild scattered rhonchi, clear to auscultation  CVS/Heart  irregular rhythm  Abdomen:   Soft, mild lower abdominal tenderness  Extremities:  No peripheral edema  Neurologic:  Alert, interactive, follows commands  Skin:  No acute rashes  Access:  Left forearm AV fistula       Basic Metabolic Panel:  Recent Labs  Lab 03/02/19 0800 03/02/19 0800 03/03/19 0505 03/03/19 0505 03/04/19 0524 03/05/19 0425 03/06/19 0558  NA 144  --  142  --  143 146* 145  K 3.3*  --  3.5  --  3.5 3.4* 4.0  CL 96*  --  95*  --  97* 99 103  CO2 33*  --  34*  --  34* 33* 31  GLUCOSE 137*  --  100*  --  103* 122* 103*  BUN 67*  --  67*  --  60* 55* 46*  CREATININE 5.52*  --  5.33*  --  4.99* 4.57* 3.77*  CALCIUM 7.2*   < > 7.1*    < > 7.3* 7.4* 7.6*  MG 1.9  --   --   --   --  1.8  --   PHOS 3.0  --  2.6  --  3.1 3.6 3.7   < > = values in this interval not displayed.     CBC: Recent Labs  Lab 02/28/19 0315 03/02/19 0000 03/02/19 0800 03/03/19 2346 03/06/19 0009  WBC 11.2*  --  5.6  --   --   HGB 7.7* 7.4* 7.6* 7.1* 7.2*  HCT 25.9* 24.6* 25.4* 23.9* 24.1*  MCV 79.9*  --  80.4  --   --   PLT 167  --  166  --   --      No results found for: HEPBSAG, HEPBSAB, HEPBIGM    Microbiology:  Recent Results (from the past 240 hour(s))  SARS CORONAVIRUS 2 (TAT 6-24 HRS) Nasopharyngeal Nasopharyngeal Swab     Status: None   Collection Time: 02/26/19  2:11 PM   Specimen: Nasopharyngeal Swab  Result Value Ref Range Status   SARS Coronavirus 2 NEGATIVE NEGATIVE Final    Comment: (NOTE) SARS-CoV-2 target nucleic acids are NOT DETECTED. The SARS-CoV-2 RNA  is generally detectable in upper and lower respiratory specimens during the acute phase of infection. Negative results do not preclude SARS-CoV-2 infection, do not rule out co-infections with other pathogens, and should not be used as the sole basis for treatment or other patient management decisions. Negative results must be combined with clinical observations, patient history, and epidemiological information. The expected result is Negative. Fact Sheet for Patients: SugarRoll.be Fact Sheet for Healthcare Providers: https://www.woods-mathews.com/ This test is not yet approved or cleared by the [place] FDA and  has been authorized for detection and/or diagnosis of SARS-CoV-2 by FDA under an Emergency Use Authorization (EUA). This EUA will remain  in effect (meaning this test can be used) for the duration of the COVID-19 declaration under Section 56 4(b)(1) of the Act, 21 U.S.C. section 360bbb-3(b)(1), unless the authorization is terminated or revoked sooner. Performed at [facility], [place] [address], [place], [place] [address]   Urine culture     Status: Abnormal   Collection Time: 02/26/19 10:32 PM   Specimen: Urine, Clean Catch  Result Value Ref Range Status   Specimen Description   Final    URINE, CLEAN CATCH Performed at [facility], [address], [place], [place] [address]    Special Requests   Final    NONE Performed at [facility], [place] [address], [place], [place] [address]    Culture >=100,000 COLONIES/mL KLEBSIELLA PNEUMONIAE (A)  Final   Report Status 02/28/2019 FINAL  Final   Organism ID, Bacteria KLEBSIELLA PNEUMONIAE (A)  Final      Susceptibility   Klebsiella pneumoniae - MIC*    AMPICILLIN RESISTANT Resistant     CEFAZOLIN <=4 SENSITIVE Sensitive     CEFTRIAXONE <=0.25 SENSITIVE Sensitive     CIPROFLOXACIN <=0.25 SENSITIVE Sensitive     GENTAMICIN <=1 SENSITIVE Sensitive     IMIPENEM 0.5 SENSITIVE Sensitive     NITROFURANTOIN 128 RESISTANT Resistant     TRIMETH/SULFA <=20 SENSITIVE Sensitive     AMPICILLIN/SULBACTAM <=2 SENSITIVE Sensitive     PIP/TAZO <=4 SENSITIVE Sensitive     * >=100,000 COLONIES/mL KLEBSIELLA PNEUMONIAE  CULTURE, BLOOD (ROUTINE X 2) w Reflex to ID Panel     Status: None   Collection Time: 02/28/19 12:34 PM   Specimen: BLOOD  Result Value Ref Range Status   Specimen Description BLOOD BLOOD RIGHT HAND  Final   Special Requests   Final    BOTTLES DRAWN AEROBIC AND ANAEROBIC Blood Culture adequate volume   Culture   Final    NO GROWTH 5 DAYS Performed at [facility], [address] [place], [place] [address]    Report Status 03/05/2019 FINAL  Final  CULTURE, BLOOD (ROUTINE X 2) w Reflex to ID Panel     Status: None   Collection Time: 02/28/19  1:41 PM   Specimen: BLOOD  Result Value Ref Range Status   Specimen Description BLOOD BLOOD RIGHT HAND  Final   Special Requests   Final    BOTTLES DRAWN AEROBIC AND ANAEROBIC Blood Culture adequate volume   Culture   Final    NO GROWTH 5 DAYS Performed at  [facility], [address] [place],  [address]    Report Status 03/05/2019 FINAL  Final    Coagulation Studies: No results for input(s): LABPROT, INR in the last 72 hours.  Urinalysis: No results for input(s): COLORURINE, LABSPEC, PHURINE, GLUCOSEU, HGBUR, BILIRUBINUR, KETONESUR, PROTEINUR, UROBILINOGEN, NITRITE, LEUKOCYTESUR in the last 72  hours.  Invalid input(s): APPERANCEUR    Imaging: No results found.   Medications:   . sodium chloride Stopped (03/01/19 1134)  . lactated ringers 50 mL/hr at 03/05/19 2255   . atorvastatin  40 mg Oral QHS  . calcium carbonate  1,000 mg Oral BID  . cephALEXin  500 mg Oral Q12H  . cholecalciferol  1,000 Units Oral Daily  . citalopram  20 mg Oral Daily  . donepezil  5 mg Oral QHS  . epoetin (EPOGEN/PROCRIT) injection  10,000 Units Subcutaneous Weekly  . feeding supplement  1 Container Oral TID BM  . folic acid  1 mg Oral BID  . heparin  5,000 Units Subcutaneous Q8H  . metoprolol tartrate  12.5 mg Oral BID  . multivitamin with minerals  1 tablet Oral Daily  . mycophenolate  180 mg Oral BID  . sirolimus  3 mg Oral Daily  . sodium chloride flush  3 mL Intravenous Once  . traZODone  50 mg Oral QHS   sodium chloride, acetaminophen, ondansetron **OR** ondansetron (ZOFRAN) IV  Assessment/ Plan:  69 y.o. female with end-stage renal disease, deceased donor renal transplant 2009, history of renal cell carcinoma status post left nephrectomy, history of cholecystectomy, hysterectomy, anemia, secondary hyperparathyroidism, dementia, previous history of Klebsiella urinary tract infection associated sepsis in 2018 history of C. difficile colitis 2017 admitted on 02/26/2019 for Dehydration [E86.0] Leucocytosis [D72.829] Acute-on-chronic kidney injury ([place]) [N17.9, N18.9] Kidney transplant recipient [Z94.0] Acute renal failure superimposed on stage 3b chronic kidney disease, unspecified acute renal failure type ([place]) [N17.9,  N18.32]   1.  Acute renal failure -Likely secondary to ATN from concurrent infection/sepsis- High WBC, Altered mental status and Documented UTI, immunocompromised patient Serum creatinine slightly lower today at 3.8 Good urine output Electrolytes and volume status are acceptable.  We will continue to monitor closely Continue treating underlying infection  2.  Chronic kidney disease stage 4, Renal transplant status -Continued on sirolimus 3 mg daily and mycophenolate 180 mg twice a day -Baseline creatinine of 2.17/GFR 10 January 2019  3.  Urinary tract infection from Klebsiella diagnosed February 26, 2019 -Currently on oral Keflex    4.  Anemia of chronic kidney disease  Lab Results  Component Value Date   HGB 7.2 (L) 03/06/2019  - Epogen weekly (sat)  5.  Hypokalemia Maintenance lactated Ringer IV infusion while in hospital Encouraged oral intake   LOS: [place] 1/20/20219:17 [place], [place]  Note: This note was prepared with Dragon dictation. Any transcription errors are unintentional

## 2019-03-06 NOTE — Progress Notes (Signed)
Physical Therapy Treatment Patient Details Name: [name] MRN: [id number] DOB: 07/15/1950 Today's Date: 03/06/2019    History of Present Illness Per MD H&P: Pt is a 69 y.o. female, with history of chronic kidney disease stage IV, history of renal transplant in 2019 on immunosuppression, anemia of chronic disease, secondary hyperparathyroidism, essential hypertension, history of C. difficile colitis who was hospitalized 3 weeks back for acute on chronic kidney disease stage IV thought to be prerenal with dehydration and improved with IV fluids.  She was discharged to SNF from where she returned home 1 week back. As per daughter patient has not been eating or drinking after returning from SNF, and has been progressively weak.  MD assessment includes: Acute renal failure superimposed on stage 4 chronic kidney disease, leukocytosis, HTN, FTT, anemia of chronic disease, and severe dementia.    PT Comments    Pt continued to require near total assist for bed mobility tasks and only assisted with below therex 50% of the time even with constant verbal and tactile cues for technique.  Pt was able to maintain static sitting at the EOB without physical assistance but even with max A was unable to come to standing this session.  Pt limited by deficits in functional strength as well as by cognitive deficits this session.  Pt will benefit from a trial of PT services in a SNF setting upon discharge to safely address deficits listed in patient problem list for decreased caregiver assistance and eventual return to PLOF.     Follow Up Recommendations  SNF     Equipment Recommendations  None recommended by PT    Recommendations for Other Services       Precautions / Restrictions Precautions Precautions: Fall Restrictions Weight Bearing Restrictions: No    Mobility  Bed Mobility Overal bed mobility: Needs Assistance Bed Mobility: Supine to Sit;Sit to Supine     Supine to sit: Max assist Sit  to supine: +2 for physical assistance;Max assist   General bed mobility comments: Min effort from pt noted  Transfers Overall transfer level: Needs assistance Equipment used: Rolling walker (2 wheeled) Transfers: Sit to/from Stand Sit to Stand: Max assist         General transfer comment: Pt able to unweight from the mattress but unable to clear the surface of the bed this session  Ambulation/Gait             General Gait Details: Unable   Stairs             Wheelchair Mobility    Modified Rankin (Stroke Patients Only)       Balance Overall balance assessment: Needs assistance Sitting-balance support: Feet unsupported;Bilateral upper extremity supported Sitting balance-Leahy Scale: Fair                                      Cognition Arousal/Alertness: Awake/alert Behavior During Therapy: WFL for tasks assessed/performed Overall Cognitive Status: No family/caregiver present to determine baseline cognitive functioning                                        Exercises Other Exercises Other Exercises: BLE supine hip, knee, ankle and BUE AAROM in available planes with max tactile and verbal cues for technique    General Comments        Pertinent  Vitals/Pain Pain Assessment: No/denies pain    Home Living                      Prior Function            PT Goals (current goals can now be found in the care plan section) Progress towards PT goals: Not progressing toward goals - comment(Pt limited by cognitive deficits and functional weakness)    Frequency    Min 2X/week      PT Plan Current plan remains appropriate    Co-evaluation              AM-PAC PT "6 Clicks" Mobility   Outcome Measure  Help needed turning from your back to your side while in a flat bed without using bedrails?: Total Help needed moving from lying on your back to sitting on the side of a flat bed without using bedrails?:  Total Help needed moving to and from a bed to a chair (including a wheelchair)?: Total Help needed standing up from a chair using your arms (e.g., wheelchair or bedside chair)?: Total Help needed to walk in hospital room?: Total Help needed climbing 3-5 steps with a railing? : Total 6 Click Score: 6    End of Session Equipment Utilized During Treatment: Gait belt Activity Tolerance: Patient tolerated treatment well Patient left: in bed;with call bell/phone within reach;with bed alarm set Nurse Communication: Mobility status PT Visit Diagnosis: Unsteadiness on feet (R26.81);Muscle weakness (generalized) (M62.81);Difficulty in walking, not elsewhere classified (R26.2)     Time: 7290-2111 PT Time Calculation (min) (ACUTE ONLY): 23 min  Charges:  $Therapeutic Exercise: 8-22 mins $Therapeutic Activity: 8-22 mins                     D. Scott Devina Bezold PT, DPT 03/06/19, 3:29 PM

## 2019-03-07 LAB — RENAL FUNCTION PANEL
Albumin: 2.2 g/dL — ABNORMAL LOW (ref 3.5–5.0)
Anion gap: 11 (ref 5–15)
BUN: 39 mg/dL — ABNORMAL HIGH (ref 8–23)
CO2: 30 mmol/L (ref 22–32)
Calcium: 7.8 mg/dL — ABNORMAL LOW (ref 8.9–10.3)
Chloride: 106 mmol/L (ref 98–111)
Creatinine, Ser: 3.42 mg/dL — ABNORMAL HIGH (ref 0.44–1.00)
GFR calc Af Amer: 15 mL/min — ABNORMAL LOW (ref >60)
GFR calc non Af Amer: 13 mL/min — ABNORMAL LOW (ref >60)
Glucose, Bld: 97 mg/dL (ref 70–99)
Phosphorus: 3.7 mg/dL (ref 2.5–4.6)
Potassium: 3.9 mmol/L (ref 3.5–5.1)
Sodium: 147 mmol/L — ABNORMAL HIGH (ref 135–145)

## 2019-03-07 NOTE — Discharge Summary (Signed)
Physician Discharge Summary   [name]  female DOB: [birth year]  GYI:[id number]  PCP: Baxter Hire, MD  Admit date: 02/26/2019 Discharge date: 03/07/2019  Admitted From: home Disposition:  Home.  PT rec SNF, but daughter wanted to take pt home, with HHPT.  Daughter updated on the phone about discharge plans the night prior to discharge.  Home Health: Yes CODE STATUS: Full code  Discharge Instructions    Diet - low sodium heart healthy   Complete by: As directed    Discharge instructions   Complete by: As directed    You will need to take antibiotic Keflex twice a day for 4 more days for your urinary track infection.    Your kidney function is improving, but not back to your baseline, so please follow up with kidney doctor.  Hold your home Lasix and potassium tablet until you see kidney doctor as outpatient.  Dr. Enzo Bi - -   Increase activity slowly   Complete by: As directed        Hospital Course:  For full details, please see H&P, progress notes, consult notes and ancillary notes.  Briefly,  [name],with history of chronic kidney disease stage IV, history of renal transplant in 2019 on immunosuppression, anemia of chronic disease, secondary hyperparathyroidism, essential hypertension, history of C. difficile colitis who presented for failure to thrive.  Prior to current admission, pt was hospitalized 3 weeks back for acute on chronic kidney disease stage IV thought to be prerenal with dehydration and improved with IV fluids. She was discharged to SNF on 02/05/19 from where she returned home 1 week back. As per daughter patient had not been eating or drinking after returning from SNF, and has been progressively weak.  In the ED, Vitals stable. Blood work showed significant leukocytosis (17.8k),hemoglobin of 9.9, BUN of 52 and creatinine of 3.56 (baseline of 1.7). Glucose of 174. Albumin of 2.9, LFTs otherwise normal. POC  Covid 19 was negative.  Acute renal failure On presentation, Cr 3.56.  Prior to discharge on 02/05/19, Cr was 1.49.  Nephrology was consulted and dx AKI as 2/2 ATN from concurrent infection/sepsis.  Infection was treated, however, Cr didn't improve significantly during hospitalization despite long course of gentle IV hydration.  Pt continued to have good urine output though.  Nephrology cleared pt for discharge.  On the day of discharge, Cr 3.42.  Home Lasix and potassium supplement held until outpatient nephrology followup.  Chronic kidney disease stage 4 Renal transplant status Continued on sirolimus 3 mg daily and mycophenolate 180 mg twice a day  Urinary tract infection from Klebsiella diagnosed February 26, 2019.  Was on Rocephin from 02/27/2019; changed to Keflex 500 mg every 12 hours on 03/01/2019.  Pt was discharged home with Keflex to complete the course of a total 10 days (3/50/09) for complicated UTI.  Leukocytosis: Resolved From UTI and may also be due to severe dehydration as well.   Failure to thrive in adult Baseline severe dementia PT and nutrition consulted, rec SNF, however, pt's daughter elected to take pt home.    Essential hypertension:  Was running low initially but then high.  Pt's BP tends to be labile, per nephrology. Pt was continued on home dose of Lopressor 25 mg twice daily, and amlodipine 5 mg daily added.  Home lasix held due to AKI.    Dementia,severe(HCC) Continued Aricept.  Anemia of chronic kidney disease Stable  Hypokalemia Repleted PRN.   Discharge Diagnoses:  Principal Problem:  Acute renal failure superimposed on stage 4 chronic kidney disease (HCC) Active Problems:   Essential hypertension   H/O kidney transplant   GERD (gastroesophageal reflux disease)   Dementia (HCC)   HLD (hyperlipidemia)   Acute-on-chronic kidney injury (HCC)   FTT (failure to thrive) in adult   Dehydration    Discharge Instructions:  Allergies as of  03/07/2019      Reactions   Ace Inhibitors Cough   Latex Swelling   Lactose Intolerance (gi)    Lisinopril Cough      Medication List    TAKE these medications   amLODipine 5 MG tablet Commonly known as: NORVASC Take 1 tablet (5 mg total) by mouth daily. New blood pressure medication.   atorvastatin 40 MG tablet Commonly known as: LIPITOR Take 40 mg by mouth at bedtime.   calcitRIOL 0.25 MCG capsule Commonly known as: ROCALTROL Take 0.25 mcg by mouth daily.   cholecalciferol 25 MCG (1000 UNIT) tablet Commonly known as: VITAMIN D3 Take 1,000 Units by mouth daily.   citalopram 20 MG tablet Commonly known as: CELEXA Take 20 mg by mouth daily.   donepezil 5 MG tablet Commonly known as: ARICEPT Take 5 mg by mouth at bedtime.   feeding supplement (ENSURE ENLIVE) Liqd Take 237 mLs by mouth 2 (two) times daily between meals.   folic acid 1 MG tablet Commonly known as: FOLVITE Take 1 mg by mouth 2 (two) times daily.   furosemide 20 MG tablet Commonly known as: LASIX Hold until follow up with kidney doctor. What changed:   how much to take  how to take this  when to take this  additional instructions   metoprolol tartrate 25 MG tablet Commonly known as: LOPRESSOR Take 25 mg by mouth 2 (two) times daily.   mycophenolate 180 MG EC tablet Commonly known as: MYFORTIC Take 180 mg by mouth 2 (two) times daily.   potassium chloride 10 MEQ tablet Commonly known as: KLOR-CON Hold until follow up with kidney doctor What changed:   how much to take  how to take this  when to take this  additional instructions   sirolimus 1 MG tablet Commonly known as: RAPAMUNE Take 3 mg by mouth daily.   traZODone 50 MG tablet Commonly known as: DESYREL Take 50 mg by mouth at bedtime.   Tums Ultra 1000 400 MG chewable tablet Generic drug: calcium elemental as carbonate Chew 1,000 mg by mouth 2 (two) times daily.     ASK your doctor about these medications     acetaminophen 325 MG tablet Commonly known as: TYLENOL Take 2 tablets (650 mg total) by mouth every 6 (six) hours as needed for mild pain or fever. Ask about: Should I take this medication?   cephALEXin 500 MG capsule Commonly known as: KEFLEX Take 1 capsule (500 mg total) by mouth every 12 (twelve) hours for 4 days. Antibiotic for urinary track infection. Ask about: Should I take this medication?       Follow-up Information    Baxter Hire, MD. Go on 03/14/2019.   Specialty: Internal Medicine Why: 10 AM APPOINTMENT,  Bring discharge papers to appointment Contact information: [place] [address] [phone]        Murlean Iba, MD. Go on 04/09/2019.   Specialty: Nephrology Why: 10 AM APPOINTMENT Contact information: [address] [place] [address] [phone]           Allergies  Allergen Reactions  . Ace Inhibitors  Cough  . Latex Swelling  . Lactose Intolerance (Gi)   . Lisinopril Cough     The results of significant diagnostics from this hospitalization (including imaging, microbiology, ancillary and laboratory) are listed below for reference.   Consultations:   Procedures/Studies: CT ABDOMEN PELVIS WO CONTRAST  Result Date: 02/26/2019 CLINICAL DATA:  Acute generalized abdominal pain, nausea, vomiting. EXAM: CT ABDOMEN AND PELVIS WITHOUT CONTRAST TECHNIQUE: Multidetector CT imaging of the abdomen and pelvis was performed following the standard protocol without IV contrast. COMPARISON:  November 30, 2018. FINDINGS: Lower chest: Small pericardial effusion. Visualized lung bases are unremarkable. Hepatobiliary: No focal liver abnormality is seen. Status post cholecystectomy. No biliary dilatation. Pancreas: Unremarkable. No pancreatic ductal dilatation or surrounding inflammatory changes. Spleen: Normal in size without focal abnormality. Adrenals/Urinary Tract: Adrenal glands are unremarkable. Severe right renal  atrophy is noted consistent with end-stage renal disease. Status post left nephrectomy. No hydronephrosis or renal obstruction is noted. Renal transplant is noted in right lower quadrant without hydronephrosis. Urinary bladder is unremarkable. Stomach/Bowel: The stomach appears normal. There is no evidence of bowel obstruction or inflammation. The appendix is not visualized. Vascular/Lymphatic: Aortic atherosclerosis. No enlarged abdominal or pelvic lymph nodes. Reproductive: Status post hysterectomy. No adnexal masses. Other: No abdominal wall hernia or abnormality. No abdominopelvic ascites. 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 No acute osseous abnormality is noted. IMPRESSION: 1. Small pericardial effusion. 2. Severe right renal atrophy consistent with end-stage renal disease. 3. Status post left nephrectomy and right lower quadrant renal transplant. 4. 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 5. Aortic atherosclerosis. Aortic Atherosclerosis (ICD10-I70.0). Electronically Signed   By: Marijo Conception M.D.   On: 02/26/2019 14:28   DG Chest Port 1 View  Result Date: 02/26/2019 CLINICAL DATA:  Weakness. EXAM: PORTABLE CHEST 1 VIEW COMPARISON:  September 22, 2018. FINDINGS: Stable cardiomegaly. No pneumothorax or pleural effusion is noted. Both lungs are clear. The visualized skeletal structures are unremarkable. IMPRESSION: No active disease. Electronically Signed   By: Marijo Conception M.D.   On: 02/26/2019 16:58      Labs: BNP (last 3 results) No results for input(s): BNP in the last 8760 hours. Basic Metabolic Panel: Recent Labs  Lab 03/07/19 0657  NA 147*  K 3.9  CL 106  CO2 30  GLUCOSE 97  BUN 39*  CREATININE 3.42*  CALCIUM 7.8*  PHOS 3.7   Liver Function Tests: Recent Labs  Lab 03/07/19 0657  ALBUMIN 2.2*   No results for input(s): LIPASE, AMYLASE in the last 168 hours. No results for input(s): AMMONIA in the last 168  hours. CBC: No results for input(s): WBC, NEUTROABS, HGB, HCT, MCV, PLT in the last 168 hours. Cardiac Enzymes: No results for input(s): CKTOTAL, CKMB, CKMBINDEX, TROPONINI in the last 168 hours. BNP: Invalid input(s): POCBNP CBG: No results for input(s): GLUCAP in the last 168 hours. D-Dimer No results for input(s): DDIMER in the last 72 hours. Hgb A1c No results for input(s): HGBA1C in the last 72 hours. Lipid Profile No results for input(s): CHOL, HDL, LDLCALC, TRIG, CHOLHDL, LDLDIRECT in the last 72 hours. Thyroid function studies No results for input(s): TSH, T4TOTAL, T3FREE, THYROIDAB in the last 72 hours.  Invalid input(s): FREET3 Anemia work up No results for input(s): VITAMINB12, FOLATE, FERRITIN, TIBC, IRON, RETICCTPCT in the last 72 hours. Urinalysis    Component Value Date/Time   COLORURINE YELLOW (A) 02/26/2019 2232   APPEARANCEUR TURBID (A) 02/26/2019 2232  APPEARANCEUR Clear 02/25/2014 1407   LABSPEC 1.012 02/26/2019 2232   LABSPEC 1.009 02/25/2014 1407   PHURINE 5.0 02/26/2019 2232   GLUCOSEU NEGATIVE 02/26/2019 2232   GLUCOSEU Negative 02/25/2014 1407   HGBUR MODERATE (A) 02/26/2019 2232   BILIRUBINUR NEGATIVE 02/26/2019 2232   BILIRUBINUR Negative 02/25/2014 Early 02/26/2019 2232   PROTEINUR 100 (A) 02/26/2019 2232   NITRITE NEGATIVE 02/26/2019 2232   LEUKOCYTESUR LARGE (A) 02/26/2019 2232   LEUKOCYTESUR Negative 02/25/2014 1407   Sepsis Labs Invalid input(s): PROCALCITONIN,  WBC,  LACTICIDVEN Microbiology No results found for this or any previous visit (from the past 240 hour(s)).   Total time spend on discharging this patient, including the last patient exam, discussing the hospital stay, instructions for ongoing care as it relates to all pertinent caregivers, as well as preparing the medical discharge records, prescriptions, and/or referrals as applicable, is 30 minutes.    Enzo Bi, MD  [facility] 03/14/2019, 2:35  AM  If 7PM-7AM, please contact night-coverage

## 2019-03-07 NOTE — Progress Notes (Signed)
[facility], [place] 03/07/19  Subjective:   Hospital day # 9  Patient is alert, interactive but only oriented to self Denies pain or shortness of breath Does not remember what she ate this morning for breakfast Nurse reports that she eats good breakfast but less during the day Renal: 01/20 0701 - 01/21 0700 In: 900 [P.O.:900] Out: 600 [Urine:600] Lab Results  Component Value Date   CREATININE 3.42 (H) 03/07/2019   CREATININE 3.77 (H) 03/06/2019   CREATININE 4.57 (H) 03/05/2019     Objective:  Vital signs in last 24 hours:  Temp:  [98.7 F (37.1 C)-99.1 F (37.3 C)] 98.7 F (37.1 C) (01/21 0627) Pulse Rate:  [64-97] 64 (01/21 1004) Resp:  [16-17] 16 (01/21 0627) BP: (117-157)/(43-113) 117/43 (01/21 1004) SpO2:  [97 %-100 %] 97 % (01/21 0627)  Weight change:  Filed Weights   02/26/19 1222 03/05/19 1529  Weight: 90.7 kg 90 kg    Intake/Output:    Intake/Output Summary (Last 24 hours) at 03/07/2019 1706 Last data filed at 03/07/2019 1300 Gross per 24 hour  Intake 300 ml  Output 1100 ml  Net -800 ml     Physical Exam: General:  No acute distress, laying in the bed  HEENT  moist oral mucous membranes  Pulm/lungs  mild scattered rhonchi, clear to auscultation  CVS/Heart  irregular rhythm  Abdomen:   Soft, mild lower abdominal tenderness  Extremities:  No peripheral edema  Neurologic:  Alert, interactive, follows commands  Skin:  No acute rashes  Access:  Left forearm AV fistula       Basic Metabolic Panel:  Recent Labs  Lab 03/02/19 0800 03/02/19 0800 03/03/19 0505 03/03/19 0505 03/04/19 0524 03/04/19 0524 03/05/19 0425 03/06/19 0558 03/07/19 0657  NA 144   < > 142  --  143  --  146* 145 147*  K 3.3*   < > 3.5  --  3.5  --  3.4* 4.0 3.9  CL 96*   < > 95*  --  97*  --  99 103 106  CO2 33*   < > 34*  --  34*  --  33* 31 30  GLUCOSE 137*   < > 100*  --  103*  --  122* 103* 97  BUN 67*   < > 67*  --  60*  --  55* 46*  39*  CREATININE 5.52*   < > 5.33*  --  4.99*  --  4.57* 3.77* 3.42*  CALCIUM 7.2*   < > 7.1*   < > 7.3*   < > 7.4* 7.6* 7.8*  MG 1.9  --   --   --   --   --  1.8  --   --   PHOS 3.0   < > 2.6  --  3.1  --  3.6 3.7 3.7   < > = values in this interval not displayed.     CBC: Recent Labs  Lab 03/02/19 0000 03/02/19 0800 03/03/19 2346 03/06/19 0009  WBC  --  5.6  --   --   HGB 7.4* 7.6* 7.1* 7.2*  HCT 24.6* 25.4* 23.9* 24.1*  MCV  --  80.4  --   --   PLT  --  166  --   --      No results found for: HEPBSAG, HEPBSAB, HEPBIGM    Microbiology:  Recent Results (from the past 240 hour(s))  SARS CORONAVIRUS 2 (TAT 6-24 HRS) Nasopharyngeal Nasopharyngeal  Swab     Status: None   Collection Time: 02/26/19  2:11 PM   Specimen: Nasopharyngeal Swab  Result Value Ref Range Status   SARS Coronavirus 2 NEGATIVE NEGATIVE Final    Comment: (NOTE) SARS-CoV-2 target nucleic acids are NOT DETECTED. The SARS-CoV-2 RNA is generally detectable in upper and lower respiratory specimens during the acute phase of infection. Negative results do not preclude SARS-CoV-2 infection, do not rule out co-infections with other pathogens, and should not be used as the sole basis for treatment or other patient management decisions. Negative results must be combined with clinical observations, patient history, and epidemiological information. The expected result is Negative. Fact Sheet for Patients: SugarRoll.be Fact Sheet for Healthcare Providers: https://www.woods-mathews.com/ This test is not yet approved or cleared by the [place] FDA and  has been authorized for detection and/or diagnosis of SARS-CoV-2 by FDA under an Emergency Use Authorization (EUA). This EUA will remain  in effect (meaning this test can be used) for the duration of the COVID-19 declaration under Section 56 4(b)(1) of the Act, 21 U.S.C. section 360bbb-3(b)(1), unless the authorization is  terminated or revoked sooner. Performed at [facility], [place] [address] [place], [place] [address]   Urine culture     Status: Abnormal   Collection Time: 02/26/19 10:32 PM   Specimen: Urine, Clean Catch  Result Value Ref Range Status   Specimen Description   Final    URINE, CLEAN CATCH Performed at [facility], [address] [place], [place] [address]    Special Requests   Final    NONE Performed at [facility], [place] [address] [place], [place] [address]    Culture >=100,000 COLONIES/mL KLEBSIELLA PNEUMONIAE (A)  Final   Report Status 02/28/2019 FINAL  Final   Organism ID, Bacteria KLEBSIELLA PNEUMONIAE (A)  Final      Susceptibility   Klebsiella pneumoniae - MIC*    AMPICILLIN RESISTANT Resistant     CEFAZOLIN <=4 SENSITIVE Sensitive     CEFTRIAXONE <=0.25 SENSITIVE Sensitive     CIPROFLOXACIN <=0.25 SENSITIVE Sensitive     GENTAMICIN <=1 SENSITIVE Sensitive     IMIPENEM 0.5 SENSITIVE Sensitive     NITROFURANTOIN 128 RESISTANT Resistant     TRIMETH/SULFA <=20 SENSITIVE Sensitive     AMPICILLIN/SULBACTAM <=2 SENSITIVE Sensitive     PIP/TAZO <=4 SENSITIVE Sensitive     * >=100,000 COLONIES/mL KLEBSIELLA PNEUMONIAE  CULTURE, BLOOD (ROUTINE X 2) w Reflex to ID Panel     Status: None   Collection Time: 02/28/19 12:34 PM   Specimen: BLOOD  Result Value Ref Range Status   Specimen Description BLOOD BLOOD RIGHT HAND  Final   Special Requests   Final    BOTTLES DRAWN AEROBIC AND ANAEROBIC Blood Culture adequate volume   Culture   Final    NO GROWTH 5 DAYS Performed at [facility], [address] [place], [place] [address]    Report Status 03/05/2019 FINAL  Final  CULTURE, BLOOD (ROUTINE X 2) w Reflex to ID Panel     Status: None   Collection Time: 02/28/19  1:41 PM   Specimen: BLOOD  Result Value Ref Range Status   Specimen Description BLOOD BLOOD RIGHT HAND  Final   Special Requests   Final    BOTTLES DRAWN AEROBIC AND ANAEROBIC  Blood Culture adequate volume   Culture   Final    NO GROWTH 5 DAYS Performed at [facility], [place],  [place], [place] [address]    Report Status 03/05/2019 FINAL  Final    Coagulation Studies: No results for input(s): LABPROT, INR in the last 72 hours.  Urinalysis: No results for input(s): COLORURINE, LABSPEC, PHURINE, GLUCOSEU, HGBUR, BILIRUBINUR, KETONESUR, PROTEINUR, UROBILINOGEN, NITRITE, LEUKOCYTESUR in the last 72 hours.  Invalid input(s): APPERANCEUR    Imaging: No results found.   Medications:   . sodium chloride Stopped (03/01/19 1134)   . amLODipine  5 mg Oral Daily  . atorvastatin  40 mg Oral QHS  . calcium carbonate  1,000 mg Oral BID  . cephALEXin  500 mg Oral Q12H  . cholecalciferol  1,000 Units Oral Daily  . citalopram  20 mg Oral Daily  . donepezil  5 mg Oral QHS  . epoetin (EPOGEN/PROCRIT) injection  10,000 Units Subcutaneous Weekly  . feeding supplement  1 Container Oral TID BM  . folic acid  1 mg Oral BID  . heparin  5,000 Units Subcutaneous Q8H  . metoprolol tartrate  25 mg Oral BID  . multivitamin with minerals  1 tablet Oral Daily  . mycophenolate  180 mg Oral BID  . sirolimus  3 mg Oral Daily  . sodium chloride flush  3 mL Intravenous Once  . traZODone  50 mg Oral QHS   sodium chloride, acetaminophen, ondansetron **OR** ondansetron (ZOFRAN) IV  Assessment/ Plan:  69 y.o. female with end-stage renal disease, deceased donor renal transplant 2009, history of renal cell carcinoma status post left nephrectomy, history of cholecystectomy, hysterectomy, anemia, secondary hyperparathyroidism, dementia, previous history of Klebsiella urinary tract infection associated sepsis in 2018 history of C. difficile colitis 2017 admitted on 02/26/2019 for Dehydration [E86.0] Leucocytosis [D72.829] Acute-on-chronic kidney injury ([place]) [N17.9, N18.9] Kidney transplant recipient [Z94.0] Acute renal failure superimposed on stage 3b chronic kidney  disease, unspecified acute renal failure type ([place]) [N17.9, N18.32]   1.  Acute renal failure -Likely secondary to ATN from concurrent infection/sepsis- High WBC, Altered mental status and Documented UTI, immunocompromised patient Serum creatinine slightly lower today at 3.4 Good urine output Electrolytes and volume status are acceptable.  We will continue to monitor closely Continue treating underlying infection  2.  Chronic kidney disease stage 4, Renal transplant status -Continued on sirolimus 3 mg daily and mycophenolate 180 mg twice a day -Baseline creatinine of 2.17/GFR 10 January 2019  3.  Urinary tract infection from Klebsiella diagnosed February 26, 2019 -Currently on oral Keflex    4.  Anemia of chronic kidney disease  Lab Results  Component Value Date   HGB 7.2 (L) 03/06/2019  - Epogen weekly (sat)  5.  Hypokalemia  improved    LOS: 9 Allyana Vogan 1/21/20215:06 PM  [place], [place]  Note: This note was prepared with Dragon dictation. Any transcription errors are unintentional

## 2019-03-07 NOTE — TOC Transition Note (Signed)
Transition of Care [facility]) - CM/SW Discharge Note   Patient Details  Name: [name] MRN: [id number] Date of Birth: Jan 28, 1951  [facility] CM/SW Contact:  Shelbie Ammons, RN Phone Number: 03/07/2019, 1:50 PM   Clinical Narrative:   RNCM placed call to patient's daughter to discuss discharge. Daughter initially reported that she would contact patient's PCS worker however she called this CM back to report she was unable to get in touch with anyone. After some discussion did inform daughter that this CM would reach out to [facility] to determine if they could transport. Placed call to [facility] and was directed to contact Medicaid transportation office, did so and left VM for return back. Received return call and was able to arrange transportation for 1pm. Notified bedside RN and MD of same.       Barriers to Discharge: Barriers Resolved   Patient Goals and CMS Choice Patient states their goals for this hospitalization and ongoing recovery are:: Per daughter for her to get to feeling better so she can come back home.      Discharge Placement                       Discharge Plan and Services   Discharge Planning Services: CM Consult Post Acute Care Choice: Home Health                    HH Arranged: RN, PT [facility] Agency: [place] ([facility]) Date [place]: 02/27/19 Time [place]: 1595 Representative spoke with at [place]: Corene Cornea, [facility]  Social Determinants of Health (SDOH) Interventions     Readmission Risk Interventions Readmission Risk Prevention Plan 02/27/2019 12/02/2018 12/01/2018  Transportation Screening Complete Complete Complete  PCP or Specialist Appt within 3-5 Days - - -  [place] or [place] - - -  Palliative Care Screening - - -  Medication Review (RN Care Manager) Complete Complete -  PCP or Specialist appointment within 3-5 days of discharge Complete Complete -  [place] or [place] Complete Complete  Complete  SW Recovery Care/Counseling Consult - Patient refused -  Palliative Care Screening Not Applicable Not Applicable -  [place] Patient Refused Not Applicable Patient Refused  Some recent data might be hidden

## 2019-03-07 NOTE — Progress Notes (Signed)
[name] to be D/C'd home with daughter per MD order.  Discussed prescriptions and follow up appointments with the patient. Prescriptions given to patient, medication list explained in detail. Pt verbalized understanding.  Allergies as of 03/07/2019       Reactions   Ace Inhibitors Cough   Latex Swelling   Lactose Intolerance (gi)    Lisinopril Cough        Medication List     TAKE these medications    acetaminophen 325 MG tablet Commonly known as: TYLENOL Take 2 tablets (650 mg total) by mouth every 6 (six) hours as needed for mild pain or fever.   amLODipine 5 MG tablet Commonly known as: NORVASC Take 1 tablet (5 mg total) by mouth daily. New blood pressure medication.   atorvastatin 40 MG tablet Commonly known as: LIPITOR Take 40 mg by mouth at bedtime.   calcitRIOL 0.25 MCG capsule Commonly known as: ROCALTROL Take 0.25 mcg by mouth daily.   cephALEXin 500 MG capsule Commonly known as: KEFLEX Take 1 capsule (500 mg total) by mouth every 12 (twelve) hours for 4 days. Antibiotic for urinary track infection.   cholecalciferol 25 MCG (1000 UNIT) tablet Commonly known as: VITAMIN D3 Take 1,000 Units by mouth daily.   citalopram 20 MG tablet Commonly known as: CELEXA Take 20 mg by mouth daily.   donepezil 5 MG tablet Commonly known as: ARICEPT Take 5 mg by mouth at bedtime.   feeding supplement (ENSURE ENLIVE) Liqd Take 237 mLs by mouth 2 (two) times daily between meals.   folic acid 1 MG tablet Commonly known as: FOLVITE Take 1 mg by mouth 2 (two) times daily.   furosemide 20 MG tablet Commonly known as: LASIX Hold until follow up with kidney doctor. What changed:  how much to take how to take this when to take this additional instructions   metoprolol tartrate 25 MG tablet Commonly known as: LOPRESSOR Take 25 mg by mouth 2 (two) times daily.   mycophenolate 180 MG EC tablet Commonly known as: MYFORTIC Take 180 mg by mouth 2 (two) times  daily.   potassium chloride 10 MEQ tablet Commonly known as: KLOR-CON Hold until follow up with kidney doctor What changed:  how much to take how to take this when to take this additional instructions   sirolimus 1 MG tablet Commonly known as: RAPAMUNE Take 3 mg by mouth daily.   traZODone 50 MG tablet Commonly known as: DESYREL Take 50 mg by mouth at bedtime.   Tums Ultra 1000 400 MG chewable tablet Generic drug: calcium elemental as carbonate Chew 1,000 mg by mouth 2 (two) times daily.        Vitals:   03/07/19 0627 03/07/19 1004  BP: (!) 157/69 (!) 117/43  Pulse: 97 64  Resp: 16   Temp: 98.7 F (37.1 C)   SpO2: 97%     Skin clean, dry and intact without evidence of skin break down, no evidence of skin tears noted. IV catheter discontinued intact. Site without signs and symptoms of complications. Dressing and pressure applied. Pt denies pain at this time. No complaints noted.  An After Visit Summary was printed and given to the patient. Patient escorted via [place], and D/C home via private auto.  [place] A [name]

## 2019-03-18 ENCOUNTER — Encounter: Payer: Medicare Other | Attending: Physician Assistant | Admitting: Physician Assistant

## 2019-03-18 ENCOUNTER — Other Ambulatory Visit: Payer: Self-pay

## 2019-03-18 DIAGNOSIS — L2489 Irritant contact dermatitis due to other agents: Secondary | ICD-10-CM | POA: Insufficient documentation

## 2019-03-18 DIAGNOSIS — F0390 Unspecified dementia without behavioral disturbance: Secondary | ICD-10-CM | POA: Insufficient documentation

## 2019-03-18 DIAGNOSIS — I10 Essential (primary) hypertension: Secondary | ICD-10-CM | POA: Insufficient documentation

## 2019-03-18 DIAGNOSIS — Z940 Kidney transplant status: Secondary | ICD-10-CM | POA: Insufficient documentation

## 2019-03-18 DIAGNOSIS — L98411 Non-pressure chronic ulcer of buttock limited to breakdown of skin: Secondary | ICD-10-CM | POA: Diagnosis not present

## 2019-03-18 DIAGNOSIS — L97111 Non-pressure chronic ulcer of right thigh limited to breakdown of skin: Secondary | ICD-10-CM | POA: Insufficient documentation

## 2019-03-18 DIAGNOSIS — L97121 Non-pressure chronic ulcer of left thigh limited to breakdown of skin: Secondary | ICD-10-CM | POA: Insufficient documentation

## 2019-03-18 NOTE — Progress Notes (Signed)
PHEONIX, WISBY ([id number]) Visit Report for 03/18/2019 Abuse/Suicide Risk Screen Details Patient Name: [name], [name] Date of Service: 03/18/2019 1:15 PM Medical Record Number: [id number] Patient Account Number: [id number] Date of Birth/Sex: 1950/05/22 (69 y.o. F) Treating RN: Montey Hora Primary Care Federick Levene: Harrel Lemon Other Clinician: Referring Luara Faye: Harrel Lemon Treating Jorita Bohanon/Extender: Melburn Hake, HOYT Weeks in Treatment: 16 Abuse/Suicide Risk Screen Items Answer ABUSE RISK SCREEN: Has anyone close to you tried to hurt or harm you recentlyo No Do you feel uncomfortable with anyone in your familyo No Has anyone forced you do things that you didnot want to doo No Electronic Signature(s) Signed: 03/18/2019 1:13:38 PM By: Montey Hora Entered By: Montey Hora on 03/18/2019 13:13:38 [name] ([id number]) -------------------------------------------------------------------------------- Activities of Daily Living Details Patient Name: [name] Date of Service: 03/18/2019 1:15 PM Medical Record Number: [id number] Patient Account Number: [id number] Date of Birth/Sex: [birth year] (68 y.o. F) Treating RN: Montey Hora Primary Care Tauren Delbuono: Harrel Lemon Other Clinician: Referring Malcome Ambrocio: Harrel Lemon Treating Durwood Dittus/Extender: Melburn Hake, HOYT Weeks in Treatment: 16 Activities of Daily Living Items Answer Activities of Daily Living (Please select one for each item) Drive Automobile Not Able Take Medications Need Assistance Use Telephone Need Assistance Care for Appearance Need Assistance Use Toilet Need Assistance Bath / Shower Need Assistance Dress Self Need Assistance Feed Self Need Assistance Walk Need Assistance Get In / Out Bed Need Assistance Housework Not Able Prepare Meals Not Able Handle Money Not Able Shop for Self Need Assistance Electronic Signature(s) Signed: 03/18/2019 1:14:11 PM By: Montey Hora Entered By: Montey Hora  on 03/18/2019 13:14:10 [name] ([id number]) -------------------------------------------------------------------------------- Education Screening Details Patient Name: [name] Date of Service: 03/18/2019 1:15 PM Medical Record Number: [id number] Patient Account Number: [id number] Date of Birth/Sex: April 11, 1950 (68 y.o. F) Treating RN: Montey Hora Primary Care Taliah Porche: Harrel Lemon Other Clinician: Referring Trusten Hume: Harrel Lemon Treating Cordie Buening/Extender: Sharalyn Ink in Treatment: 16 Primary Learner Assessed: Caregiver Reason Patient is not Primary Learner: dementia Learning Preferences/Education Level/Primary Language Learning Preference: Explanation, Demonstration Highest Education Level: High School Preferred Language: English Cognitive Barrier Language Barrier: No Translator Needed: No Memory Deficit: No Emotional Barrier: No Cultural/Religious Beliefs Affecting Medical Care: No Physical Barrier Impaired Vision: No Impaired Hearing: No Decreased Hand dexterity: No Knowledge/Comprehension Knowledge Level: Medium Comprehension Level: Medium Ability to understand written Medium instructions: Ability to understand verbal Medium instructions: Motivation Anxiety Level: Calm Cooperation: Cooperative Education Importance: Acknowledges Need Interest in Health Problems: Asks Questions Perception: Coherent Willingness to Engage in Self- Medium Management Activities: Readiness to Engage in Self- Medium Management Activities: Electronic Signature(s) Signed: 03/18/2019 1:14:43 PM By: Montey Hora Entered By: Montey Hora on 03/18/2019 13:14:43 AKASHA, MELENA ([id number]) -------------------------------------------------------------------------------- Fall Risk Assessment Details Patient Name: [name] Date of Service: 03/18/2019 1:15 PM Medical Record Number: [id number] Patient Account Number: [id number] Date of Birth/Sex:  [birth year] (68 y.o. F) Treating RN: Montey Hora Primary Care Israa Caban: Harrel Lemon Other Clinician: Referring Alvey Brockel: Harrel Lemon Treating Envy Meno/Extender: Melburn Hake, HOYT Weeks in Treatment: 17 Fall Risk Assessment Items Have you had 2 or more falls in the last 12 monthso 0 No Have you had any fall that resulted in injury in the last 12 monthso 0 No FALLS RISK SCREEN History of falling - immediate or within 3 months 0 No Secondary diagnosis (Do you have 2 or more medical diagnoseso) 0 No Ambulatory aid None/bed rest/wheelchair/nurse 0 No Crutches/cane/walker 15 Yes Furniture 0 No Intravenous therapy Access/Saline/Heparin Lock 0 No Gait/Transferring Normal/ bed rest/ wheelchair 0 No Weak (short  steps with or without shuffle, stooped but able to lift head while 10 Yes walking, may seek support from furniture) Impaired (short steps with shuffle, may have difficulty arising from chair, head 0 No down, impaired balance) Mental Status Oriented to own ability 0 No Electronic Signature(s) Signed: 03/18/2019 1:15:09 PM By: Montey Hora Entered By: Montey Hora on 03/18/2019 13:15:08 [place] ([id number]) -------------------------------------------------------------------------------- Foot Assessment Details Patient Name: [name] Date of Service: 03/18/2019 1:15 PM Medical Record Number: [id number] Patient Account Number: [id number] Date of Birth/Sex: 1950/09/11 (68 y.o. F) Treating RN: Montey Hora Primary Care Rochelle Larue: Harrel Lemon Other Clinician: Referring Bradlee Bridgers: Harrel Lemon Treating Daissy Yerian/Extender: Melburn Hake, HOYT Weeks in Treatment: 16 Foot Assessment Items Site Locations + = Sensation present, - = Sensation absent, C = Callus, U = Ulcer R = Redness, W = Warmth, M = Maceration, PU = Pre-ulcerative lesion F = Fissure, S = Swelling, D = Dryness Assessment Right: Left: Other Deformity: No No Prior Foot Ulcer: No No Prior Amputation:  No No Charcot Joint: No No Ambulatory Status: Ambulatory With Help Assistance Device: Walker Gait: Steady Electronic Signature(s) Signed: 03/18/2019 1:15:31 PM By: Montey Hora Entered By: Montey Hora on 03/18/2019 13:15:30 [name] ([id number]) -------------------------------------------------------------------------------- Nutrition Risk Screening Details Patient Name: [name] Date of Service: 03/18/2019 1:15 PM Medical Record Number: [id number] Patient Account Number: [id number] Date of Birth/Sex: February 10, 1951 (68 y.o. F) Treating RN: Montey Hora Primary Care Murdock Jellison: Harrel Lemon Other Clinician: Referring Desirae Mancusi: Harrel Lemon Treating Dani Danis/Extender: Melburn Hake, HOYT Weeks in Treatment: 16 Height (in): 59 Weight (lbs): 193 Body Mass Index (BMI): 39 Nutrition Risk Screening Items Score Screening NUTRITION RISK SCREEN: I have an illness or condition that made me change the kind and/or amount of 0 No food I eat I eat fewer than two meals per day 0 No I eat few fruits and vegetables, or milk products 0 No I have three or more drinks of beer, liquor or wine almost every day 0 No I have tooth or mouth problems that make it hard for me to eat 0 No I don't always have enough money to buy the food I need 0 No I eat alone most of the time 0 No I take three or more different prescribed or over-the-counter drugs a day 1 Yes Without wanting to, I have lost or gained 10 pounds in the last six months 0 No I am not always physically able to shop, cook and/or feed myself 0 No Nutrition Protocols Good Risk Protocol 0 No interventions needed Moderate Risk Protocol High Risk Proctocol Risk Level: Good Risk Score: 1 Electronic Signature(s) Signed: 03/18/2019 1:15:18 PM By: Montey Hora Entered By: Montey Hora on 03/18/2019 13:15:18

## 2019-03-19 ENCOUNTER — Other Ambulatory Visit: Payer: Self-pay | Admitting: Internal Medicine

## 2019-03-19 DIAGNOSIS — Z1231 Encounter for screening mammogram for malignant neoplasm of breast: Secondary | ICD-10-CM

## 2019-03-21 NOTE — Progress Notes (Signed)
ALLYSE, FREGEAU ([id number]) Visit Report for 03/18/2019 Allergy List Details Patient Name: [name], [name] Date of Service: 03/18/2019 1:15 PM Medical Record Number: [id number] Patient Account Number: [id number] Date of Birth/Sex: [birth year] (69 y.o. F) Treating RN: Montey Hora Primary Care Aashna Matson: Harrel Lemon Other Clinician: Referring Curly Mackowski: Harrel Lemon Treating Waylynn Benefiel/Extender: STONE III, HOYT Weeks in Treatment: 16 Allergies Active Allergies ACE Inhibitors Reaction: cough lisinopril Reaction: cough Allergy Notes Electronic Signature(s) Signed: 03/18/2019 1:13:28 PM By: Montey Hora Entered By: Montey Hora on 03/18/2019 13:13:27 [name] ([id number]) -------------------------------------------------------------------------------- Arrival Information Details Patient Name: [name] Date of Service: 03/18/2019 1:15 PM Medical Record Number: [id number] Patient Account Number: [id number] Date of Birth/Sex: 1950-09-10 (69 y.o. F) Treating RN: Cornell Barman Primary Care Dheeraj Hail: Harrel Lemon Other Clinician: Referring Mildred Bollard: Harrel Lemon Treating Legacy Lacivita/Extender: Melburn Hake, HOYT Weeks in Treatment: 16 Visit Information Patient Arrived: Wheel Chair Arrival Time: 13:10 Accompanied By: daughter Transfer Assistance: Manual Patient Identification Verified: Yes Secondary Verification Process Completed: Yes Patient Requires Transmission-Based No Precautions: Patient Has Alerts: Yes Patient Alerts: NOT Diabetic History Since Last Visit Added or deleted any medications: No Any new allergies or adverse reactions: No Had a fall or experienced change in activities of daily living that may affect risk of falls: No Signs or symptoms of abuse/neglect since last visito No Hospitalized since last visit: No Implantable device outside of the clinic excluding cellular tissue based products placed in the center since last visit: No Electronic  Signature(s) Signed: 03/18/2019 3:33:21 PM By: Lorine Bears RCP, RRT, CHT Entered By: Lorine Bears on 03/18/2019 13:13:15 [name] ([id number]) -------------------------------------------------------------------------------- Clinic Level of Care Assessment Details Patient Name: [name] Date of Service: 03/18/2019 1:15 PM Medical Record Number: [id number] Patient Account Number: [id number] Date of Birth/Sex: 08-30-1950 (69 y.o. F) Treating RN: Cornell Barman Primary Care Ileana Chalupa: Harrel Lemon Other Clinician: Referring Traycen Goyer: Harrel Lemon Treating Mikiah Demond/Extender: Melburn Hake, HOYT Weeks in Treatment: 16 Clinic Level of Care Assessment Items TOOL 4 Quantity Score []  - Use when only an EandM is performed on FOLLOW-UP visit 0 ASSESSMENTS - Nursing Assessment / Reassessment X - Reassessment of Co-morbidities (includes updates in patient status) 1 10 X- 1 5 Reassessment of Adherence to Treatment Plan ASSESSMENTS - Wound and Skin Assessment / Reassessment []  - Simple Wound Assessment / Reassessment - one wound 0 X- 2 5 Complex Wound Assessment / Reassessment - multiple wounds []  - 0 Dermatologic / Skin Assessment (not related to wound area) ASSESSMENTS - Focused Assessment []  - Circumferential Edema Measurements - multi extremities 0 []  - 0 Nutritional Assessment / Counseling / Intervention []  - 0 Lower Extremity Assessment (monofilament, tuning fork, pulses) []  - 0 Peripheral Arterial Disease Assessment (using hand held doppler) ASSESSMENTS - Ostomy and/or Continence Assessment and Care []  - Incontinence Assessment and Management 0 []  - 0 Ostomy Care Assessment and Management (repouching, etc.) PROCESS - Coordination of Care X - Simple Patient / Family Education for ongoing care 1 15 []  - 0 Complex (extensive) Patient / Family Education for ongoing care []  - 0 Staff obtains Programmer, systems, Records, Test Results / Process Orders []  -  0 Staff telephones HHA, Nursing Homes / Clarify orders / etc []  - 0 Routine Transfer to another Facility (non-emergent condition) []  - 0 Routine Hospital Admission (non-emergent condition) []  - 0 New Admissions / Biomedical engineer / Ordering NPWT, Apligraf, etc. []  - 0 Emergency Hospital Admission (emergent condition) X- 1 10 Simple Discharge Coordination [name], Berry ([id number]) []  - 0 Complex (extensive) Discharge Coordination PROCESS -  Special Needs []  - Pediatric / Minor Patient Management 0 []  - 0 Isolation Patient Management []  - 0 Hearing / Language / Visual special needs []  - 0 Assessment of Community assistance (transportation, D/C planning, etc.) []  - 0 Additional assistance / Altered mentation []  - 0 Support Surface(s) Assessment (bed, cushion, seat, etc.) INTERVENTIONS - Wound Cleansing / Measurement []  - Simple Wound Cleansing - one wound 0 X- 2 5 Complex Wound Cleansing - multiple wounds X- 1 5 Wound Imaging (photographs - any number of wounds) []  - 0 Wound Tracing (instead of photographs) []  - 0 Simple Wound Measurement - one wound X- 2 5 Complex Wound Measurement - multiple wounds INTERVENTIONS - Wound Dressings []  - Small Wound Dressing one or multiple wounds 0 X- 2 15 Medium Wound Dressing one or multiple wounds []  - 0 Large Wound Dressing one or multiple wounds []  - 0 Application of Medications - topical []  - 0 Application of Medications - injection INTERVENTIONS - Miscellaneous []  - External ear exam 0 []  - 0 Specimen Collection (cultures, biopsies, blood, body fluids, etc.) []  - 0 Specimen(s) / Culture(s) sent or taken to Lab for analysis []  - 0 Patient Transfer (multiple staff / Civil Service fast streamer / Similar devices) []  - 0 Simple Staple / Suture removal (25 or less) []  - 0 Complex Staple / Suture removal (26 or more) []  - 0 Hypo / Hyperglycemic Management (close monitor of Blood Glucose) []  - 0 Ankle / Brachial Index (ABI) - do  not check if billed separately []  - 0 Vital Signs [name], [name] ([id number]) Has the patient been seen at the hospital within the last three years: Yes Total Score: 105 Level Of Care: New/Established - Level 3 Electronic Signature(s) Signed: 03/21/2019 5:22:08 PM By: Gretta Cool, BSN, RN, CWS, Kim RN, BSN Entered By: Gretta Cool, BSN, RN, CWS, Kim on 03/18/2019 13:56:39 [name], [name] ([id number]) -------------------------------------------------------------------------------- Encounter Discharge Information Details Patient Name: [name] Date of Service: 03/18/2019 1:15 PM Medical Record Number: [id number] Patient Account Number: [id number] Date of Birth/Sex: 07-Oct-1950 (69 y.o. F) Treating RN: Cornell Barman Primary Care Mary Hockey: Harrel Lemon Other Clinician: Referring Shenay Torti: Harrel Lemon Treating Jilliam Bellmore/Extender: Melburn Hake, HOYT Weeks in Treatment: 16 Encounter Discharge Information Items Discharge Condition: Stable Ambulatory Status: Wheelchair Discharge Destination: Home Transportation: Private Auto Accompanied By: daughter Schedule Follow-up Appointment: Yes Clinical Summary of Care: Electronic Signature(s) Signed: 03/21/2019 5:22:08 PM By: Gretta Cool, BSN, RN, CWS, Kim RN, BSN Entered By: Gretta Cool, BSN, RN, CWS, Kim on 03/18/2019 13:57:51 [name] ([id number]) -------------------------------------------------------------------------------- Lower Extremity Assessment Details Patient Name: [name] Date of Service: 03/18/2019 1:15 PM Medical Record Number: [id number] Patient Account Number: [id number] Date of Birth/Sex: 30-Sep-1950 (69 y.o. F) Treating RN: Montey Hora Primary Care Mylani Gentry: Harrel Lemon Other Clinician: Referring Affan Callow: Harrel Lemon Treating Kaitlynd Phillips/Extender: Melburn Hake, HOYT Weeks in Treatment: 16 Electronic Signature(s) Signed: 03/18/2019 1:27:29 PM By: Montey Hora Entered By: Montey Hora on 03/18/2019 13:27:28 [name], [name]  ([id number]) -------------------------------------------------------------------------------- Multi Wound Chart Details Patient Name: [name] Date of Service: 03/18/2019 1:15 PM Medical Record Number: [id number] Patient Account Number: [id number] Date of Birth/Sex: 07/03/1950 (69 y.o. F) Treating RN: Cornell Barman Primary Care Dannica Bickham: Harrel Lemon Other Clinician: Referring Ladonte Verstraete: Harrel Lemon Treating Sarahmarie Leavey/Extender: Melburn Hake, HOYT Weeks in Treatment: 16 Vital Signs Height(in): 59 Pulse(bpm): 84 Weight(lbs): 193 Blood Pressure(mmHg): 124/64 Body Mass Index(BMI): 39 Temperature(F): 97.5 Respiratory Rate 16 (breaths/min): Photos: [1:No Photos] [2:No Photos] [4:No Photos] Wound Location: [1:Left Gluteus] [2:Right Gluteal fold] [4:Left, Medial Upper Leg] Wounding Event: [1:Gradually Appeared] [  2:Gradually Appeared] [4:Gradually Appeared] 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)] [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] Secondary Etiology: [1:Pressure Ulcer] [2:N/A] [4:N/A] Comorbid History: [1:Hypertension, Dementia, Neuropathy] [2:N/A] [4:Hypertension, Dementia, Neuropathy] Date Acquired: [1:10/16/2018] [2:11/29/2018] [4:12/31/2018] Weeks of Treatment: [1:16] [2:15] [4:10] Wound Status: [1:Open] [2:Healed - Epithelialized] [4:Open] Clustered Wound: [1:Yes] [2:No] [4:Yes] Clustered Quantity: [1:2] [2:N/A] [4:2] Measurements L x W x D [1:3x1x0.1] [2:0x0x0] [4:2.9x3.8x0.1] (cm) Area (cm) : [1:2.356] [2:0] [4:8.655] Volume (cm) : [1:0.236] [2:0] [4:0.866] % Reduction in Area: [1:-5.30%] [2:100.00%] [4:-588.50%] % Reduction in Volume: [1:-5.40%] [2:100.00%] [4:-587.30%] Classification: [1:Full Thickness Without Exposed Support Structures] [2:Full Thickness Without Exposed Support Structures] [4:Full Thickness Without Exposed Support Structures] Exudate Amount: [1:Medium] [2:N/A] [4:Medium] Exudate Type: [1:Serosanguineous] [2:N/A]  [4:Serosanguineous] Exudate Color: [1:red, brown] [2:N/A] [4:red, brown] Wound Margin: [1:Flat and Intact] [2:N/A] [4:Flat and Intact] Granulation Amount: [1:Large (67-100%)] [2:N/A] [4:Medium (34-66%)] Granulation Quality: [1:Red] [2:N/A] [4:Red] Necrotic Amount: [1:Small (1-33%)] [2:N/A] [4:Medium (34-66%)] Exposed Structures: [1:Fat Layer (Subcutaneous Tissue) Exposed: Yes Fascia: No Tendon: No Muscle: No Joint: No Bone: No] [2:N/A] [4:Fat Layer (Subcutaneous Tissue) Exposed: Yes Fascia: No Tendon: No Muscle: No Joint: No Bone: No] Epithelialization: [1:None] [2:N/A] [4:Small (1-33%)] Wound Number: 6 N/A N/A Photos: No Photos N/A N/A Wound Location: Right Upper Leg N/A N/A Wounding Event: Gradually Appeared N/A N/A Primary Etiology: MASD N/A N/A Secondary Etiology: N/A N/A N/A Comorbid History: Hypertension, Dementia, N/A N/A Neuropathy Date Acquired: 01/24/2019 N/A N/A Weeks of Treatment: 7 N/A N/A Wound Status: Open N/A N/A Clustered Wound: No N/A N/A Clustered Quantity: 1 N/A N/A Measurements L x W x D 0.6x0.7x0.1 N/A N/A (cm) Area (cm) : 0.33 N/A N/A Volume (cm) : 0.033 N/A N/A % Reduction in Area: -50.00% N/A N/A % Reduction in Volume: -50.00% N/A N/A Classification: Full Thickness Without N/A N/A Exposed Support Structures Exudate Amount: Medium N/A N/A Exudate Type: Serous N/A N/A Exudate Color: amber N/A N/A Wound Margin: Flat and Intact N/A N/A Granulation Amount: Large (67-100%) N/A N/A Granulation Quality: Pink N/A N/A Necrotic Amount: None Present (0%) N/A N/A Exposed Structures: Fat Layer (Subcutaneous N/A N/A Tissue) Exposed: Yes Fascia: No Tendon: No Muscle: No Joint: No Bone: No Epithelialization: None N/A N/A Treatment Notes Electronic Signature(s) Signed: 03/21/2019 5:22:08 PM By: Gretta Cool, BSN, RN, CWS, Kim RN, BSN Entered By: Gretta Cool, BSN, RN, CWS, Kim on 03/18/2019 13:50:38 [name], [name]  ([id number]) -------------------------------------------------------------------------------- Multi-Disciplinary Care Plan Details Patient Name: [name] Date of Service: 03/18/2019 1:15 PM Medical Record Number: [id number] Patient Account Number: [id number] Date of Birth/Sex: [birth year] (69 y.o. F) Treating RN: Cornell Barman Primary Care Joleene Burnham: Harrel Lemon Other Clinician: Referring Marti Mclane: Harrel Lemon Treating Rayquon Uselman/Extender: Melburn Hake, HOYT Weeks in Treatment: 42 Active Inactive Abuse / Safety / Falls / Self Care Management Nursing Diagnoses: Potential for falls Goals: Patient/caregiver will demonstrate safe use of adaptive devices to increase mobility Date Initiated: 11/22/2018 Target Resolution Date: 12/21/2018 Goal Status: Active Interventions: Assess: immobility, friction, shearing, incontinence upon admission and as needed Notes: Orientation to the Wound Care Program Nursing Diagnoses: Knowledge deficit related to the wound healing center program Goals: Patient/caregiver will verbalize understanding of the [facility] Date Initiated: 11/22/2018 Target Resolution Date: 12/21/2018 Goal Status: Active Interventions: Provide education on orientation to the wound center Notes: Pressure Nursing Diagnoses: Knowledge deficit related to management of pressures ulcers Goals: Patient/caregiver will verbalize risk factors for pressure ulcer development Date Initiated: 11/22/2018 Target Resolution Date: 12/21/2018 Goal Status: Active Interventions: Assess: immobility, friction, shearing, incontinence upon admission and as needed Villavicencio, Rogina (  [id number]) Notes: Wound/Skin Impairment Nursing Diagnoses: Impaired tissue integrity Goals: Ulcer/skin breakdown will have a volume reduction of 30% by week 4 Date Initiated: 11/22/2018 Target Resolution Date: 12/21/2018 Goal Status: Active Interventions: Assess ulceration(s) every  visit Notes: Electronic Signature(s) Signed: 03/21/2019 5:22:08 PM By: Gretta Cool, BSN, RN, CWS, Kim RN, BSN Entered By: Gretta Cool, BSN, RN, CWS, Kim on 03/18/2019 13:50:25 [name] ([id number]) -------------------------------------------------------------------------------- Pain Assessment Details Patient Name: [name] Date of Service: 03/18/2019 1:15 PM Medical Record Number: [id number] Patient Account Number: [id number] Date of Birth/Sex: Jun 26, 1950 (69 y.o. F) Treating RN: Cornell Barman Primary Care Cohen Doleman: Harrel Lemon Other Clinician: Referring Cameryn Schum: Harrel Lemon Treating Kirstin Kugler/Extender: Melburn Hake, HOYT Weeks in Treatment: 16 Active Problems Location of Pain Severity and Description of Pain Patient Has Paino No Site Locations Pain Management and Medication Current Pain Management: Electronic Signature(s) Signed: 03/18/2019 3:33:21 PM By: Lorine Bears RCP, RRT, CHT Signed: 03/21/2019 5:22:08 PM By: Gretta Cool, BSN, RN, CWS, Kim RN, BSN Entered By: Lorine Bears on 03/18/2019 13:13:22 [name], [name] ([id number]) -------------------------------------------------------------------------------- Wound Assessment Details Patient Name: [name] Date of Service: 03/18/2019 1:15 PM Medical Record Number: [id number] Patient Account Number: [id number] Date of Birth/Sex: June 13, 1950 (69 y.o. F) Treating RN: Cornell Barman Primary Care Sallyann Kinnaird: Harrel Lemon Other Clinician: Referring Xadrian Craighead: Harrel Lemon Treating Kathyjo Briere/Extender: Melburn Hake, HOYT Weeks in Treatment: 16 Wound Status Wound Number: 1 Primary Etiology: Incontinence Associated Dermatitis (IAD) Wound Location: Left Gluteus Secondary Etiology: Pressure Ulcer Wounding Event: Gradually Appeared Wound Status: Healed - Epithelialized Date Acquired: 10/16/2018 Comorbid History: Hypertension, Dementia, Neuropathy Weeks Of Treatment: 16 Clustered Wound: Yes Photos Photo Uploaded By:  Montey Hora on 03/18/2019 13:57:25 Wound Measurements Length: (cm) 0 % Re Width: (cm) 0 % Re Depth: (cm) 0 Epit Clustered Quantity: 2 Tunn Area: (cm) 0 Und Volume: (cm) 0 duction in Area: 100% duction in Volume: 100% helialization: None eling: No ermining: No Wound Description Full Thickness Without Exposed Support Classification: Structures Wound Margin: Flat and Intact Exudate Medium Amount: Exudate Type: Serosanguineous Exudate Color: red, brown Foul Odor After Cleansing: No Slough/Fibrino Yes Wound Bed Granulation Amount: Large (67-100%) Exposed Structure Granulation Quality: Red Fascia Exposed: No Necrotic Amount: Small (1-33%) Fat Layer (Subcutaneous Tissue) Exposed: Yes Necrotic Quality: Adherent Slough Tendon Exposed: No Muscle Exposed: No Joint Exposed: No [name], [name] ([id number]) Bone Exposed: No Electronic Signature(s) Signed: 03/21/2019 5:22:08 PM By: Gretta Cool, BSN, RN, CWS, Kim RN, BSN Entered By: Gretta Cool, BSN, RN, CWS, Kim on 03/18/2019 13:52:07 [name] ([id number]) -------------------------------------------------------------------------------- Wound Assessment Details Patient Name: [name] Date of Service: 03/18/2019 1:15 PM Medical Record Number: [id number] Patient Account Number: [id number] Date of Birth/Sex: 06-Oct-1950 (69 y.o. F) Treating RN: Montey Hora Primary Care Laylanie Kruczek: Harrel Lemon Other Clinician: Referring Minette Manders: Harrel Lemon Treating Lloyde Ludlam/Extender: Melburn Hake, HOYT Weeks in Treatment: 16 Wound Status Wound Number: 2 Primary Etiology: Incontinence Associated Dermatitis (IAD) Wound Location: Right Gluteal fold Wound Status: Healed - Epithelialized Wounding Event: Gradually Appeared Date Acquired: 11/29/2018 Weeks Of Treatment: 15 Clustered Wound: No Wound Measurements Length: (cm) Width: (cm) Depth: (cm) Area: (cm) Volume: (cm) 0 % Reduction in Area: 100% 0 % Reduction in Volume:  100% 0 0 0 Wound Description Full Thickness Without Exposed Support Classification: Structures Electronic Signature(s) Signed: 03/18/2019 4:01:28 PM By: Montey Hora Entered By: Montey Hora on 03/18/2019 13:40:36 [name] ([id number]) -------------------------------------------------------------------------------- Wound Assessment Details Patient Name: [name] Date of Service: 03/18/2019 1:15 PM Medical Record Number: [id number] Patient Account Number: [id number] Date of Birth/Sex: 1950-10-31 (69 y.o. F) Treating  RN: Montey Hora Primary Care Christabella Alvira: Harrel Lemon Other Clinician: Referring Cledis Sohn: Harrel Lemon Treating Morell Mears/Extender: Melburn Hake, HOYT Weeks in Treatment: 16 Wound Status Wound Number: 4 Primary Etiology: MASD Wound Location: Left, Medial Upper Leg Wound Status: Open Wounding Event: Gradually Appeared Comorbid History: Hypertension, Dementia, Neuropathy Date Acquired: 12/31/2018 Weeks Of Treatment: 10 Clustered Wound: Yes Photos Photo Uploaded By: Montey Hora on 03/18/2019 13:57:25 Wound Measurements Length: (cm) 2.9 Width: (cm) 3.8 Depth: (cm) 0.1 Clustered Quantity: 2 Area: (cm) 8.655 Volume: (cm) 0.866 % Reduction in Area: -588.5% % Reduction in Volume: -587.3% Epithelialization: Small (1-33%) Tunneling: No Undermining: No Wound Description Full Thickness Without Exposed Support Classification: Structures Wound Margin: Flat and Intact Exudate Medium Amount: Exudate Type: Serosanguineous Exudate Color: red, brown Foul Odor After Cleansing: No Slough/Fibrino Yes Wound Bed Granulation Amount: Medium (34-66%) Exposed Structure Granulation Quality: Red Fascia Exposed: No Necrotic Amount: Medium (34-66%) Fat Layer (Subcutaneous Tissue) Exposed: Yes Necrotic Quality: Adherent Slough Tendon Exposed: No Muscle Exposed: No Joint Exposed: No [name], [name] ([id number]) Bone Exposed: No Treatment  Notes Wound #4 (Left, Medial Upper Leg) Notes Silver cell, BFD Electronic Signature(s) Signed: 03/18/2019 4:01:28 PM By: Montey Hora Entered By: Montey Hora on 03/18/2019 13:40:24 [name] ([id number]) -------------------------------------------------------------------------------- Wound Assessment Details Patient Name: [name] Date of Service: 03/18/2019 1:15 PM Medical Record Number: [id number] Patient Account Number: [id number] Date of Birth/Sex: 03/26/1950 (69 y.o. F) Treating RN: Cornell Barman Primary Care Lamara Brecht: Harrel Lemon Other Clinician: Referring Charolett Yarrow: Harrel Lemon Treating Rosana Farnell/Extender: Melburn Hake, HOYT Weeks in Treatment: 16 Wound Status Wound Number: 6 Primary Etiology: MASD Wound Location: Right, Posterior Upper Leg Wound Status: Open Wounding Event: Gradually Appeared Comorbid History: Hypertension, Dementia, Neuropathy Date Acquired: 01/24/2019 Weeks Of Treatment: 7 Clustered Wound: Yes Photos Photo Uploaded By: Montey Hora on 03/18/2019 13:57:38 Wound Measurements Length: (cm) 0.6 Width: (cm) 0.7 Depth: (cm) 0.1 Clustered Quantity: 1 Area: (cm) 0.33 Volume: (cm) 0.033 % Reduction in Area: -50% % Reduction in Volume: -50% Epithelialization: None Tunneling: No Undermining: No Wound Description Full Thickness Without Exposed Support Classification: Structures Wound Margin: Flat and Intact Exudate Medium Amount: Exudate Type: Serous Exudate Color: amber Foul Odor After Cleansing: No Slough/Fibrino No Wound Bed Granulation Amount: Large (67-100%) Exposed Structure Granulation Quality: Pink Fascia Exposed: No Necrotic Amount: None Present (0%) Fat Layer (Subcutaneous Tissue) Exposed: Yes Tendon Exposed: No Muscle Exposed: No Joint Exposed: No [name], [name] ([id number]) Bone Exposed: No Treatment Notes Wound #6 (Right, Posterior Upper Leg) Notes Silver cell, BFD Electronic Signature(s) Signed:  03/21/2019 5:22:08 PM By: Gretta Cool, BSN, RN, CWS, Kim RN, BSN Entered By: Gretta Cool, BSN, RN, CWS, Kim on 03/18/2019 13:52:07 [name] ([id number]) -------------------------------------------------------------------------------- [place] Details Patient Name: [name] Date of Service: 03/18/2019 1:15 PM Medical Record Number: [id number] Patient Account Number: [id number] Date of Birth/Sex: 1951-01-25 (69 y.o. F) Treating RN: Cornell Barman Primary Care Colin Ellers: Harrel Lemon Other Clinician: Referring Kaylaann Mountz: Harrel Lemon Treating Shawntrice Salle/Extender: Melburn Hake, HOYT Weeks in Treatment: 16 Vital Signs Time Taken: 13:10 Temperature (F): 97.5 Height (in): 59 Pulse (bpm): 84 Weight (lbs): 193 Respiratory Rate (breaths/min): 16 Body Mass Index (BMI): 39 Blood Pressure (mmHg): 124/64 Reference Range: 80 - 120 mg / dl Electronic Signature(s) Signed: 03/18/2019 3:33:21 PM By: Lorine Bears RCP, RRT, CHT Entered By: Lorine Bears on 03/18/2019 13:13:56

## 2019-03-21 NOTE — Progress Notes (Signed)
PATRIZIA, PAULE ([id number]) Visit Report for 03/18/2019 Chief Complaint Document Details Patient Name: [name], [name] Date of Service: 03/18/2019 1:15 PM Medical Record Number: [id number] Patient Account Number: [id number] Date of Birth/Sex: [birth year] (69 y.o. F) Treating RN: Cornell Barman Primary Care Provider: Harrel Lemon Other Clinician: Referring Provider: Harrel Lemon Treating Provider/Extender: Melburn Hake, Mella Inclan Weeks in Treatment: 16 Information Obtained from: Patient Chief Complaint Gluteal and bilateral upper leg ulcers Electronic Signature(s) Signed: 03/18/2019 2:00:39 PM By: Worthy Keeler PA-C Previous Signature: 03/18/2019 1:30:09 PM Version By: Worthy Keeler PA-C Entered By: Worthy Keeler on 03/18/2019 14:00:39 JONNE, ROTE ([id number]) -------------------------------------------------------------------------------- HPI Details Patient Name: [name] Date of Service: 03/18/2019 1:15 PM Medical Record Number: [id number] Patient Account Number: [id number] Date of Birth/Sex: 11/23/1950 (68 y.o. F) Treating RN: Cornell Barman Primary Care Provider: Harrel Lemon Other Clinician: Referring Provider: Harrel Lemon Treating Provider/Extender: Melburn Hake, Da Michelle Weeks in Treatment: 16 History of Present Illness HPI Description: 11/22/2018 patient presents today for initial evaluation in our clinic concerning issues that she has been having with a wound in the left gluteal fold. This has been present since August and was noted initially when she was in the hospital. Fortunately there is no signs of active infection at this time. No fever chills noted. Up to this point DuoDERM has been utilized although I am not sure that is the best treatment option for her based on what I am seeing currently. This actually started as a result of moisture associated skin damage secondary to a C. difficile infection. The patient does have dementia. She is also status post having had a kidney  transplant and does have hypertension as well. She does spend a lot of her time during the day sitting which is probably a part of why this is having trouble healing although it is not really a pressure ulcer that may be contributing to the lack of significant and rapid improvement. She still is having diarrhea as well and is still on treatment for the C. difficile infection she is on Dificid. They have been using barrier cream in the gluteal region to try to help protect the area where the dressing was not. 11/29/2018 upon evaluation today patient does not appear to be doing significantly better at this time. She still had some somewhat softer stools but not really as much diarrhea as what she had when the C. difficile infection was not full cycle. Overall things seem to be doing better in that regard although she does have 2 new areas on evaluation today. There is no signs of active infection. No fevers, chills, nausea, vomiting, or diarrhea. 12/06/2018 on evaluation today patient actually appears to be doing better with regard to the wounds in the gluteal region in general. Overall the biggest issue has been that apparently one of the wounds was missed by the home health nurse and not noted to be open and its been getting stuck to her brief. Nonetheless other than that and even with that considered she still seems to actually be doing quite well as far as I am seeing today. 12/20/2018 on evaluation today patient appears to be doing very well in regard to her wounds. Fortunately there is no sign of active infection at this time which is great news. She has been tolerating the dressing changes without complication although [facility] is no longer coming out they just discontinued the patient's treatment all of a sudden standing that they were complete as far as what they could do.  Nonetheless unfortunately the patient still has wounds open and has not had any dressings to put on the area  they have been doing the best they could until they came in here today. There is no evidence of infection and things seem to be doing quite well. 01/03/2019 on evaluation today patient appears to be doing okay in some regards regarding her wounds. Specifically the main wound on the left gluteal fold in the right gluteal fold seems to be doing much better. The right gluteal fold in fact is pretty much healed. On the left this still has a little bit deeper area open at this point I think that still needs a bandage. Unfortunately she got different bandages than the silicone, Allevyn, bandages that we had recommended and ordered for her. For that reason she unfortunately does have some additional breakdown around where the bandages have been pulling at this time. This has caused several areas of skin breakdown unfortunately. Fortunately there is no signs of active infection at this time. No fevers, chills, nausea, vomiting, or diarrhea. 01/17/2019 on evaluation today patient appears to be having a lot of issues with skin tearing due to adhesive's from the border foam dressing that was sent from the medical supplier. With that being said we specifically ordered Allevyn dressings I am not sure why they sent the other and less it has a insurance issue with not actually covering the dressings that they feel like that is better for her. With that being said there does not appear to be any signs of active infection at this point which is good news. Nonetheless she is still having pain. She is seen with a caregiver here today. 01/24/2019 on evaluation today patient appears to be doing well with regard to her wounds. In fact she is significantly improved as compared to last week's evaluation. Having the right dressings, Allevyn, has been of utmost importance for her this does not seem to damage her skin which is excellent news. Overall the patient also does not seem to be having as much pain. She again is seen  with her caregiver during the office visit today and her caregiver is very pleased with how things seem to be progressing with the proper treatments. Sheri, GAAL ([id number]) 03/18/2019 patient is currently being seen after unfortunately having been in the hospital a couple of times since we last saw her for various reasons unrelated to her wounds. Unfortunately she does still have some of the wounds present at this time. There is no signs of active infection which is good news at least. Home health has been coming out to see her it sounds like they have been using silver alginate dressings on the area. She mainly has regions open on her upper leg/thigh region bilaterally there was an area in the gluteal region but this appears to likely be closed today. Electronic Signature(s) Signed: 03/18/2019 2:01:29 PM By: Worthy Keeler PA-C Entered By: Worthy Keeler on 03/18/2019 14:01:28 IYANLA, EILERS ([id number]) -------------------------------------------------------------------------------- Physical Exam Details Patient Name: [name] Date of Service: 03/18/2019 1:15 PM Medical Record Number: [id number] Patient Account Number: [id number] Date of Birth/Sex: [birth year] (68 y.o. F) Treating RN: Cornell Barman Primary Care Provider: Harrel Lemon Other Clinician: Referring Provider: Harrel Lemon Treating Provider/Extender: Melburn Hake, Reeves Musick Weeks in Treatment: 16 Constitutional Obese and well-hydrated in no acute distress. Respiratory normal breathing without difficulty. Psychiatric this patient is able to make decisions and demonstrates good insight into disease process. Alert and Oriented x 3. pleasant and cooperative.  Notes Upon inspection patient's gluteal ulcer that was remaining is showing signs of complete epithelization at this point which is great news I do not see any any evidence of otherwise infection currently that is good to cause any complications or trouble. With regard  to the wounds on her legs the right side appears to be doing quite well and in fact is likely can heal fairly rapidly the left on the medial aspect of her thigh actually is a little bit more significant this will likely take a bit longer to completely resolve. Electronic Signature(s) Signed: 03/18/2019 2:02:09 PM By: Worthy Keeler PA-C Entered By: Worthy Keeler on 03/18/2019 14:02:09 HECTOR, VENNE ([id number]) -------------------------------------------------------------------------------- Physician Orders Details Patient Name: [name] Date of Service: 03/18/2019 1:15 PM Medical Record Number: [id number] Patient Account Number: [id number] Date of Birth/Sex: Feb 17, 1950 (68 y.o. 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 - in office o Cleanse wound with mild soap and water Wound #6 Right,Posterior Upper Leg o Clean wound with Normal Saline. - in office o Cleanse wound with mild soap and water Skin Barriers/Peri-Wound Care Wound #4 Left,Medial Upper Leg o Skin Prep - periwound Wound #6 Right,Posterior Upper Leg o Skin Prep - periwound o Skin Prep - periwound Primary Wound Dressing Wound #4 Left,Medial Upper Leg o Silver Alginate Wound #6 Right,Posterior  Upper Leg o Silver Alginate Secondary Dressing Wound #4 Left,Medial Upper Leg o Boardered Foam Dressing Wound #6 Right,Posterior Upper Leg o Boardered Foam Dressing Gwynn, Anjalee ([id number]) Dressing Change Frequency Wound #4 Left,Medial Upper Leg o Other: - twice weekly Wound #6 Right,Posterior Upper Leg o Other: - twice weekly Follow-up Appointments Wound #4 Left,Medial Upper Leg o Return Appointment in 1 week. Wound #6 Right,Posterior Upper Leg o Return Appointment in 1 week. Off-Loading Wound #4 Left,Medial Upper Leg o Turn and reposition every 2 hours o Other: - memory foam cushion while sitting, rolled towel between legs when sitting, if possible. Wound #6 Right,Posterior Upper Leg o Turn and reposition every 2 hours o Other: - memory foam cushion while sitting, rolled towel between legs when sitting, if possible. Home Health Wound #4 Left,Medial Upper Leg o Continue Home Health Visits - [place] Nurse may visit PRN to address patientos wound care needs. o FACE TO FACE ENCOUNTER: MEDICARE and MEDICAID PATIENTS: I certify that this patient is under my care and that I had a face-to-face encounter that meets the physician face-to-face encounter requirements with this patient on this date. The encounter with the patient was in whole or in part for the following MEDICAL CONDITION: (primary reason for [place]) MEDICAL NECESSITY: I certify, that based on my findings, NURSING services are a medically necessary home health service. HOME BOUND STATUS: I certify that my clinical findings support that this patient is homebound (i.e., Due to illness or injury, pt requires aid of supportive devices such as crutches, cane, wheelchairs, walkers, the use of  special transportation or the assistance of another person to leave their place of residence. There is a normal inability to leave the home and doing so requires considerable and  taxing effort. Other absences are for medical reasons / religious services and are infrequent or of short duration when for other reasons). o If current dressing causes regression in wound condition, may D/C ordered dressing product/s and apply Normal Saline Moist Dressing daily until next [place] / Other MD appointment. [place] of regression in wound condition at [phone]. o Please direct any NON-WOUND related issues/requests for orders to patient's Primary Care Physician Wound #6 Right,Posterior Upper Leg o [place] Visits - [facility] Nurse may visit PRN to address patientos wound care needs. o FACE TO FACE ENCOUNTER: MEDICARE and MEDICAID PATIENTS: I certify that this patient is under my care and that I had a face-to-face encounter that meets the physician face-to-face encounter requirements with this patient on this date. The encounter with the patient was in whole or in part for the following MEDICAL CONDITION: (primary reason for [place]) MEDICAL NECESSITY: I certify, that based on my findings, NURSING services are a medically necessary home health service. HOME BOUND STATUS: I certify that my clinical findings support that this patient is homebound (i.e., Due to illness or injury, pt requires aid of supportive devices such as crutches, cane, wheelchairs, walkers, the use of special transportation or the assistance of another person to leave their place of residence. There is a normal inability to leave the home and doing so requires considerable and taxing effort. Other absences are for medical reasons / religious services and are infrequent or of short duration when for other reasons). [place], [name] ([id number]) o If current dressing causes regression in wound condition, may D/C ordered dressing product/s and apply Normal Saline Moist Dressing daily until next [place] / Other MD  appointment. [place] of regression in wound condition at [phone]. o Please direct any NON-WOUND related issues/requests for orders to patient's Primary Care Physician Electronic Signature(s) Signed: 03/18/2019 4:06:16 PM By: Worthy Keeler PA-C Signed: 03/21/2019 5:22:08 PM By: Gretta Cool, BSN, RN, CWS, Kim RN, BSN Entered By: Gretta Cool, BSN, RN, CWS, Kim on 03/18/2019 13:55:42 AVIVA, WOLFER ([id number]) -------------------------------------------------------------------------------- Problem List Details Patient Name: [name] Date of Service: 03/18/2019 1:15 PM Medical Record Number: [id number] Patient Account Number: [id number] Date of Birth/Sex: 08-Jul-1950 (68 y.o. 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 to breakdown of 03/18/2019 No Yes skin L97.121 Non-pressure chronic ulcer of left thigh limited to breakdown 03/18/2019 No Yes of skin L97.111 Non-pressure chronic ulcer of right thigh limited to breakdown 03/18/2019 No Yes of skin F03.90 Unspecified dementia without behavioral disturbance 11/22/2018 No Yes Z94.0 Kidney transplant status 11/22/2018 No Yes I10 Essential (primary) hypertension 11/22/2018 No Yes Inactive Problems Resolved Problems Electronic Signature(s) Signed: 03/18/2019 1:46:54 PM By: Worthy Keeler PA-C Previous Signature: 03/18/2019 1:29:43 PM Version By: Worthy Keeler PA-C Entered By: Worthy Keeler on 03/18/2019 13:46:54 [place], Trinidy ([id number]) [place], [name] ([id number]) -------------------------------------------------------------------------------- Progress Note Details Patient  Name: [name] Date of Service: 03/18/2019 1:15 PM Medical Record Number: [id number] Patient Account Number: [id number] Date of Birth/Sex:  06-18-1950 (69 y.o. F) Treating RN: Cornell Barman Primary Care Provider: Harrel Lemon Other Clinician: Referring Provider: Harrel Lemon Treating Provider/Extender: Melburn Hake, Bernie Ransford Weeks in Treatment: 16 Subjective Chief Complaint Information obtained from Patient Gluteal and bilateral upper leg ulcers History of Present Illness (HPI) 11/22/2018 patient presents today for initial evaluation in our clinic concerning issues that she has been having with a wound in the left gluteal fold. This has been present since August and was noted initially when she was in the hospital. Fortunately there is no signs of active infection at this time. No fever chills noted. Up to this point DuoDERM has been utilized although I am not sure that is the best treatment option for her based on what I am seeing currently. This actually started as a result of moisture associated skin damage secondary to a C. difficile infection. The patient does have dementia. She is also status post having had a kidney transplant and does have hypertension as well. She does spend a lot of her time during the day sitting which is probably a part of why this is having trouble healing although it is not really a pressure ulcer that may be contributing to the lack of significant and rapid improvement. She still is having diarrhea as well and is still on treatment for the C. difficile infection she is on Dificid. They have been using barrier cream in the gluteal region to try to help protect the area where the dressing was not. 11/29/2018 upon evaluation today patient does not appear to be doing significantly better at this time. She still had some somewhat softer stools but not really as much diarrhea as what she had when the C. difficile infection was not full cycle. Overall things seem to be  doing better in that regard although she does have 2 new areas on evaluation today. There is no signs of active infection. No fevers, chills, nausea, vomiting, or diarrhea. 12/06/2018 on evaluation today patient actually appears to be doing better with regard to the wounds in the gluteal region in general. Overall the biggest issue has been that apparently one of the wounds was missed by the home health nurse and not noted to be open and its been getting stuck to her brief. Nonetheless other than that and even with that considered she still seems to actually be doing quite well as far as I am seeing today. 12/20/2018 on evaluation today patient appears to be doing very well in regard to her wounds. Fortunately there is no sign of active infection at this time which is great news. She has been tolerating the dressing changes without complication although [facility] home [facility] is no longer coming out they just discontinued the patient's treatment all of a sudden standing that they were complete as far as what they could do. Nonetheless unfortunately the patient still has wounds open and has not had any dressings to put on the area they have been doing the best they could until they came in here today. There is no evidence of infection and things seem to be doing quite well. 01/03/2019 on evaluation today patient appears to be doing okay in some regards regarding her wounds. Specifically the main wound on the left gluteal fold in the right gluteal fold seems to be doing much better. The right gluteal fold in fact is pretty much healed. On the left this still has a little bit deeper area open at this point I think that still needs a bandage. Unfortunately she  got different bandages than the silicone, Allevyn, bandages that we had recommended and ordered for her. For that reason she unfortunately does have some additional breakdown around where the bandages have been pulling at this time. This has caused  several areas of skin breakdown unfortunately. Fortunately there is no signs of active infection at this time. No fevers, chills, nausea, vomiting, or diarrhea. 01/17/2019 on evaluation today patient appears to be having a lot of issues with skin tearing due to adhesive's from the border foam dressing that was sent from the medical supplier. With that being said we specifically ordered Allevyn dressings I am not sure why they sent the other and less it has a insurance issue with not actually covering the dressings that they feel like that is better for her. With that being said there does not appear to be any signs of active infection at this point which is good news. Nonetheless she is still having pain. She is seen with a caregiver here today. XARIA, JUDON ([id number]) 01/24/2019 on evaluation today patient appears to be doing well with regard to her wounds. In fact she is significantly improved as compared to last week's evaluation. Having the right dressings, Allevyn, has been of utmost importance for her this does not seem to damage her skin which is excellent news. Overall the patient also does not seem to be having as much pain. She again is seen with her caregiver during the office visit today and her caregiver is very pleased with how things seem to be progressing with the proper treatments. 03/18/2019 patient is currently being seen after unfortunately having been in the hospital a couple of times since we last saw her for various reasons unrelated to her wounds. Unfortunately she does still have some of the wounds present at this time. There is no signs of active infection which is good news at least. Home health has been coming out to see her it sounds like they have been using silver alginate dressings on the area. She mainly has regions open on her upper leg/thigh region bilaterally there was an area in the gluteal region but this appears to likely be closed today. Patient  History Unable to Obtain Patient History due to Dementia. Allergies ACE Inhibitors (Reaction: cough), lisinopril (Reaction: cough) Family History Heart Disease - Siblings, No family history of Cancer, Diabetes, Hypertension, Kidney Disease, Lung Disease, Seizures, Stroke, Thyroid Problems, Tuberculosis. Social History Former smoker - Quit 40+ years ago, Marital Status - Married, Alcohol Use - Never, Drug Use - No History, Caffeine Use - Rarely. Medical History Cardiovascular Patient has history of Hypertension Denies history of Angina, Arrhythmia, Congestive Heart Failure, Coronary Artery Disease, Deep Vein Thrombosis, Hypotension, Myocardial Infarction, Peripheral Arterial Disease, Peripheral Venous Disease, Phlebitis, Vasculitis Endocrine Denies history of Type I Diabetes, Type II Diabetes Genitourinary Denies history of End Stage Renal Disease Integumentary (Skin) Denies history of History of Burn, History of pressure wounds Neurologic Patient has history of Dementia, Neuropathy - hands Denies history of Quadriplegia, Paraplegia, Seizure Disorder Oncologic Denies history of Received Chemotherapy, Received Radiation Hospitalization/Surgery History - [facility] UTI. Medical And Surgical History Notes Genitourinary Kidney Transpant 5+ years ago, CKD Review of Systems (ROS) Constitutional Symptoms (General Health) Denies complaints or symptoms of Fatigue, Fever, Chills, Marked Weight Change. Eyes Denies complaints or symptoms of Dry Eyes, Vision Changes, Glasses / Contacts. Ear/Nose/Mouth/Throat Denies complaints or symptoms of Difficult clearing ears, Sinusitis. SANDRINE, BLOODSWORTH ([id number]) Hematologic/Lymphatic Denies complaints or symptoms of Bleeding / Clotting Disorders, Human Immunodeficiency Virus.  Respiratory Denies complaints or symptoms of Chronic or frequent coughs, Shortness of Breath. Cardiovascular Denies complaints or symptoms of Chest pain, LE  edema. Gastrointestinal Denies complaints or symptoms of Frequent diarrhea, Nausea, Vomiting. Endocrine Denies complaints or symptoms of Hepatitis, Thyroid disease, Polydypsia (Excessive Thirst). Genitourinary Complains or has symptoms of Incontinence/dribbling. Denies complaints or symptoms of Kidney failure/ Dialysis. Immunological Denies complaints or symptoms of Hives, Itching. Integumentary (Skin) Complains or has symptoms of Wounds. Denies complaints or symptoms of Bleeding or bruising tendency, Breakdown, Swelling. Musculoskeletal Denies complaints or symptoms of Muscle Pain, Muscle Weakness. Neurologic Denies complaints or symptoms of Numbness/parasthesias, Focal/Weakness. Psychiatric Denies complaints or symptoms of Anxiety, Claustrophobia. Objective Constitutional Obese and well-hydrated in no acute distress. Vitals Time Taken: 1:10 PM, Height: 59 in, Weight: 193 lbs, BMI: 39, Temperature: 97.5 F, Pulse: 84 bpm, Respiratory Rate: 16 breaths/min, Blood Pressure: 124/64 mmHg. Respiratory normal breathing without difficulty. Psychiatric this patient is able to make decisions and demonstrates good insight into disease process. Alert and Oriented x 3. pleasant and cooperative. General Notes: Upon inspection patient's gluteal ulcer that was remaining is showing signs of complete epithelization at this point which is great news I do not see any any evidence of otherwise infection currently that is good to cause any complications or trouble. With regard to the wounds on her legs the right side appears to be doing quite well and in fact is likely can heal fairly rapidly the left on the medial aspect of her thigh actually is a little bit more significant this will likely take a bit longer to completely resolve. Integumentary (Hair, Skin) Wound #1 status is Healed - Epithelialized. Original cause of wound was Gradually Appeared. The wound is located on the Left Gluteus. The wound  measures 0cm length x 0cm width x 0cm depth; 0cm^2 area and 0cm^3 volume. There is Fat Acey Lav, Jniyah ([id number]) (Subcutaneous Tissue) Exposed exposed. There is no tunneling or undermining noted. There is a medium amount of serosanguineous drainage noted. The wound margin is flat and intact. There is large (67-100%) red granulation within the wound bed. There is a small (1-33%) amount of necrotic tissue within the wound bed including Adherent Slough. Wound #2 status is Healed - Epithelialized. Original cause of wound was Gradually Appeared. The wound is located on the Right Gluteal fold. The wound measures 0cm length x 0cm width x 0cm depth; 0cm^2 area and 0cm^3 volume. Wound #4 status is Open. Original cause of wound was Gradually Appeared. The wound is located on the Left,Medial Upper Leg. The wound measures 2.9cm length x 3.8cm width x 0.1cm depth; 8.655cm^2 area and 0.866cm^3 volume. There is Fat Layer (Subcutaneous Tissue) Exposed exposed. There is no tunneling or undermining noted. There is a medium amount of serosanguineous drainage noted. The wound margin is flat and intact. There is medium (34-66%) red granulation within the wound bed. There is a medium (34-66%) amount of necrotic tissue within the wound bed including Adherent Slough. Wound #6 status is Open. Original cause of wound was Gradually Appeared. The wound is located on the Right,Posterior Upper Leg. The wound measures 0.6cm length x 0.7cm width x 0.1cm depth; 0.33cm^2 area and 0.033cm^3 volume. There is Fat Layer (Subcutaneous Tissue) Exposed exposed. There is no tunneling or undermining noted. There is a medium amount of serous drainage noted. The wound margin is flat and intact. There is large (67-100%) pink granulation within the wound bed. There is no necrotic tissue within the wound bed. 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 - in office Cleanse wound with mild soap and water Wound #6 Right,Posterior Upper Leg: Clean wound with Normal Saline. - in office Cleanse wound with mild soap and water Skin Barriers/Peri-Wound Care: Wound #4 Left,Medial Upper Leg: Skin Prep - periwound Wound #6 Right,Posterior Upper Leg: Skin Prep - periwound Skin Prep - periwound Primary Wound Dressing: Wound #4 Left,Medial Upper Leg: Silver Alginate Jentsch, Annastasia ([id number]) Wound #6 Right,Posterior Upper Leg: Silver Alginate Secondary Dressing: Wound #4 Left,Medial Upper Leg: Boardered Foam Dressing Wound #6 Right,Posterior Upper Leg: Boardered Foam Dressing Dressing Change Frequency: Wound #4 Left,Medial Upper Leg: Other: - twice weekly Wound #6 Right,Posterior Upper Leg: Other: - twice weekly Follow-up Appointments: Wound #4 Left,Medial Upper Leg: Return Appointment in 1 week. Wound #6 Right,Posterior Upper Leg: Return Appointment in 1 week. Off-Loading: Wound #4 Left,Medial Upper Leg: Turn and reposition every 2 hours Other: - memory foam cushion while sitting, rolled towel between legs when sitting, if possible. Wound #6 Right,Posterior Upper Leg: Turn and reposition every 2 hours Other: - memory foam cushion while sitting, rolled towel between legs when sitting, if possible. Home Health: Wound #4 Left,Medial Upper Leg: Continue Home Health Visits - [place] Nurse may visit PRN to address patient s wound care needs. FACE TO FACE ENCOUNTER: MEDICARE and MEDICAID PATIENTS: I certify that this patient is under my care and that I had  a face-to-face encounter that meets the physician face-to-face encounter requirements with this patient on this date. The encounter with the patient was in whole or in part for the following MEDICAL CONDITION: (primary reason for [place]) MEDICAL NECESSITY: I certify, that based on my findings, NURSING services are a medically necessary home health service. HOME BOUND STATUS: I certify that my clinical findings support that this patient is homebound (i.e., Due to illness or injury, pt requires aid of supportive devices such as crutches, cane, wheelchairs, walkers, the use of special transportation or the assistance of another person to leave their place of residence. There is a normal inability to leave the home and doing so requires considerable and taxing effort. Other absences are for medical reasons / religious services and are infrequent or of short duration when for other reasons). If current dressing causes regression in wound condition, may D/C ordered dressing product/s and apply Normal Saline Moist Dressing daily until next [place] / Other MD appointment. [place] of regression in wound condition at [phone]. Please direct any NON-WOUND related issues/requests for orders to patient's Primary Care Physician Wound #6 Right,Posterior Upper Leg: [place] Nurse may visit PRN to address patient s wound care needs. FACE TO FACE ENCOUNTER: MEDICARE and MEDICAID PATIENTS: I certify that this patient is under my care and that I had a face-to-face encounter that meets the physician face-to-face encounter requirements with this patient on this date. The encounter with the patient was in whole or in part for the following MEDICAL CONDITION: (primary reason for [place]) MEDICAL NECESSITY: I certify, that based on my findings, NURSING services are a medically necessary home health service. HOME BOUND STATUS:  I certify that my clinical findings support that this patient is homebound (i.e., Due to illness or injury, pt requires aid of supportive devices such as crutches, cane, wheelchairs, walkers, the use of special transportation or the assistance of another person to leave their place of residence. There is a normal inability to leave the home and doing so requires considerable and taxing effort. Other absences are for medical reasons / religious services and are infrequent or of short duration when for other reasons). If current dressing causes regression in wound condition, may D/C ordered dressing product/s and apply Normal Saline Moist Dressing daily until next [place] / Other MD appointment. [place] of regression in wound condition at [phone]. Please direct any NON-WOUND related issues/requests for orders to patient's Primary Care Physician [place], AYRA ([id number]) 1. At this point my suggestion is good to be that we actually continue the silver alginate dressing I still think this is probably the best option for the patient. Obviously we want to make sure that this heals appropriately we will keep a close eye on things to such end. 2. I would recommend as well that offloading be attempted. It is somewhat difficult with her dementia to sometimes explain why she needs to do things and get her to do it according to her family member. Nonetheless I think this is of utmost importance. We will see patient back for reevaluation in 1 week here in the clinic. If anything worsens or changes patient will contact our office for additional recommendations. Electronic Signature(s) Signed: 03/18/2019 2:10:12 PM By: Worthy Keeler PA-C Entered By: Worthy Keeler on 03/18/2019 14:10:12 JAZMAN, REUTER ([id number]) -------------------------------------------------------------------------------- ROS/PFSH Details Patient Name: [name] Date of Service:  03/18/2019 1:15 PM Medical Record Number: [id number] Patient Account Number: [id number] Date of Birth/Sex: Mar 22, 1950 (68 y.o. F) Treating RN: Montey Hora Primary Care Provider: Harrel Lemon Other Clinician: Referring Provider: Harrel Lemon Treating Provider/Extender: Melburn Hake, [place] Peddy Weeks in Treatment: 81 Unable to Obtain Patient History due to oo Dementia Constitutional Symptoms (General Health) Complaints and Symptoms: Negative for: Fatigue; Fever; Chills; Marked Weight Change Eyes Complaints and Symptoms: Negative for: Dry Eyes; Vision Changes; Glasses / Contacts Ear/Nose/Mouth/Throat Complaints and Symptoms: Negative for: Difficult clearing ears; Sinusitis Hematologic/Lymphatic Complaints and Symptoms: Negative for: Bleeding / Clotting Disorders; Human Immunodeficiency Virus Respiratory Complaints and Symptoms: Negative for: Chronic or frequent coughs; Shortness of Breath Cardiovascular Complaints and Symptoms: Negative for: Chest pain; LE edema Medical History: Positive for: Hypertension Negative for: Angina; Arrhythmia; Congestive Heart Failure; Coronary Artery Disease; Deep Vein Thrombosis; Hypotension; Myocardial Infarction; Peripheral Arterial Disease; Peripheral Venous Disease; Phlebitis; Vasculitis Gastrointestinal Complaints and Symptoms: Negative for: Frequent diarrhea; Nausea; Vomiting Endocrine Complaints and Symptoms: Negative for: Hepatitis; Thyroid disease; Polydypsia (Excessive Thirst) Medical History: Negative for: Type I Diabetes; Type II Diabetes JANECIA, PALAU ([id number]) Genitourinary Complaints and Symptoms: Positive for: Incontinence/dribbling Negative for: Kidney failure/ Dialysis Medical History: Negative for: End Stage Renal Disease Past Medical History Notes: Kidney Transpant 5+ years ago, CKD Immunological Complaints and Symptoms: Negative for: Hives; Itching Integumentary (Skin) Complaints and Symptoms: Positive for:  Wounds Negative for: Bleeding or bruising tendency; Breakdown; Swelling Medical History: Negative for: History of Burn; History of pressure wounds Musculoskeletal Complaints and Symptoms: Negative for: Muscle Pain; Muscle Weakness Neurologic Complaints and Symptoms: Negative for: Numbness/parasthesias; Focal/Weakness Medical History: Positive for: Dementia; Neuropathy - hands Negative for: Quadriplegia; Paraplegia; Seizure Disorder Psychiatric Complaints and Symptoms: Negative for: Anxiety; Claustrophobia Oncologic Medical History: Negative for: Received Chemotherapy; Received Radiation  Immunizations Pneumococcal Vaccine: Received Pneumococcal Vaccination: Yes Implantable Devices None Hospitalization / Surgery History Type of Hospitalization/Surgery [facility] UTI SARINITY, DICICCO ([id number]) Family and Social History Cancer: No; Diabetes: No; Heart Disease: Yes - Siblings; Hypertension: No; Kidney Disease: No; Lung Disease: No; Seizures: No; Stroke: No; Thyroid Problems: No; Tuberculosis: No; Former smoker - Quit 40+ years ago; Marital Status - Married; Alcohol Use: Never; Drug Use: No History; Caffeine Use: Rarely; Financial Concerns: No; Food, Clothing or Shelter Needs: No; Support System Lacking: No; Transportation Concerns: No Electronic Signature(s) Signed: 03/18/2019 4:01:28 PM By: Montey Hora Signed: 03/18/2019 4:06:16 PM By: Worthy Keeler PA-C Entered By: Montey Hora on 03/18/2019 13:24:14 YENNIFER, SEGOVIA ([id number]) -------------------------------------------------------------------------------- [place] Details Patient Name: [name] Date of Service: 03/18/2019 Medical Record Number: [id number] Patient Account Number: [id number] Date of Birth/Sex: 1950-11-28 (68 y.o. 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 F03.90 Unspecified dementia without behavioral disturbance Z94.0 Kidney transplant status I10 Essential (primary) hypertension Facility Procedures CPT4 Code: 16384536 Description: 99213 - WOUND CARE VISIT-LEV 3 EST PT Modifier: Quantity: 1 Physician Procedures CPT4 Code: [id number] Description: 22482 - WC PHYS LEVEL 3 - EST PT ICD-10 Diagnosis Description L24.89 Irritant contact dermatitis due to other agents L97.121 Non-pressure chronic ulcer of left thigh limited to breakdo L98.411 Non-pressure chronic ulcer of buttock limited to  breakdown L97.111 Non-pressure chronic ulcer of right thigh limited to breakd Modifier: wn of skin of skin own of skin Quantity: 1 Electronic Signature(s) Signed: 03/18/2019 2:10:30 PM By: Worthy Keeler PA-C Entered By: Worthy Keeler on 03/18/2019 14:10:30

## 2019-03-25 ENCOUNTER — Encounter: Payer: Medicare Other | Admitting: Physician Assistant

## 2019-03-25 ENCOUNTER — Other Ambulatory Visit: Payer: Self-pay

## 2019-03-25 DIAGNOSIS — L2489 Irritant contact dermatitis due to other agents: Secondary | ICD-10-CM | POA: Diagnosis not present

## 2019-03-26 ENCOUNTER — Telehealth: Payer: Self-pay | Admitting: Nurse Practitioner

## 2019-03-26 NOTE — Progress Notes (Signed)
DELLAR, TRABER ([id number]) Visit Report for 03/25/2019 Arrival Information Details Patient Name: [name], [name] Date of Service: 03/25/2019 11:00 AM Medical Record Number: [id number] Patient Account Number: [id number] Date of Birth/Sex: February 01, 1951 (69 y.o. F) Treating RN: Cornell Barman Primary Care Sufyan Meidinger: Harrel Lemon Other Clinician: Referring Inari Shin: Harrel Lemon Treating Mellany Dinsmore/Extender: Melburn Hake, HOYT Weeks in Treatment: 41 Visit Information History Since Last Visit Added or deleted any medications: No Patient Arrived: Ambulatory Any new allergies or adverse reactions: No Arrival Time: 11:00 Had a fall or experienced change in No Accompanied By: daughter activities of daily living that may affect Transfer Assistance: None risk of falls: Patient Identification Verified: Yes Signs or symptoms of abuse/neglect since last visito No Secondary Verification Process Completed: Yes Hospitalized since last visit: No Patient Requires Transmission-Based No Implantable device outside of the clinic excluding No Precautions: cellular tissue based products placed in the center Patient Has Alerts: Yes since last visit: Patient Alerts: NOT Has Dressing in Place as Prescribed: Yes Diabetic Pain Present Now: No Electronic Signature(s) Signed: 03/25/2019 4:25:10 PM By: Lorine Bears RCP, RRT, CHT Entered By: Lorine Bears on 03/25/2019 11:01:45 [name] ([id number]) -------------------------------------------------------------------------------- Clinic Level of Care Assessment Details Patient Name: [name] Date of Service: 03/25/2019 11:00 AM Medical Record Number: [id number] Patient Account Number: [id number] Date of Birth/Sex: Feb 15, 1950 (69 y.o. F) Treating RN: Cornell Barman Primary Care Opal Dinning: Harrel Lemon Other Clinician: Referring Caelan Atchley: Harrel Lemon Treating Raysean Graumann/Extender: Melburn Hake, HOYT Weeks in Treatment: 17 Clinic  Level of Care Assessment Items TOOL 4 Quantity Score []  - Use when only an EandM is performed on FOLLOW-UP visit 0 ASSESSMENTS - Nursing Assessment / Reassessment []  - Reassessment of Co-morbidities (includes updates in patient status) 0 X- 1 5 Reassessment of Adherence to Treatment Plan ASSESSMENTS - Wound and Skin Assessment / Reassessment X - Simple Wound Assessment / Reassessment - one wound 1 5 []  - 0 Complex Wound Assessment / Reassessment - multiple wounds []  - 0 Dermatologic / Skin Assessment (not related to wound area) ASSESSMENTS - Focused Assessment []  - Circumferential Edema Measurements - multi extremities 0 []  - 0 Nutritional Assessment / Counseling / Intervention []  - 0 Lower Extremity Assessment (monofilament, tuning fork, pulses) []  - 0 Peripheral Arterial Disease Assessment (using hand held doppler) ASSESSMENTS - Ostomy and/or Continence Assessment and Care []  - Incontinence Assessment and Management 0 []  - 0 Ostomy Care Assessment and Management (repouching, etc.) PROCESS - Coordination of Care X - Simple Patient / Family Education for ongoing care 1 15 []  - 0 Complex (extensive) Patient / Family Education for ongoing care []  - 0 Staff obtains Programmer, systems, Records, Test Results / Process Orders []  - 0 Staff telephones HHA, Nursing Homes / Clarify orders / etc []  - 0 Routine Transfer to another Facility (non-emergent condition) []  - 0 Routine Hospital Admission (non-emergent condition) []  - 0 New Admissions / Biomedical engineer / Ordering NPWT, Apligraf, etc. []  - 0 Emergency Hospital Admission (emergent condition) X- 1 10 Simple Discharge Coordination [place], Angelina ([id number]) []  - 0 Complex (extensive) Discharge Coordination PROCESS - Special Needs []  - Pediatric / Minor Patient Management 0 []  - 0 Isolation Patient Management []  - 0 Hearing / Language / Visual special needs []  - 0 Assessment of Community assistance (transportation, D/C  planning, etc.) []  - 0 Additional assistance / Altered mentation []  - 0 Support Surface(s) Assessment (bed, cushion, seat, etc.) INTERVENTIONS - Wound Cleansing / Measurement X - Simple Wound Cleansing - one wound 1 5 []  -  0 Complex Wound Cleansing - multiple wounds X- 1 5 Wound Imaging (photographs - any number of wounds) []  - 0 Wound Tracing (instead of photographs) X- 1 5 Simple Wound Measurement - one wound []  - 0 Complex Wound Measurement - multiple wounds INTERVENTIONS - Wound Dressings []  - Small Wound Dressing one or multiple wounds 0 X- 1 15 Medium Wound Dressing one or multiple wounds []  - 0 Large Wound Dressing one or multiple wounds []  - 0 Application of Medications - topical []  - 0 Application of Medications - injection INTERVENTIONS - Miscellaneous []  - External ear exam 0 []  - 0 Specimen Collection (cultures, biopsies, blood, body fluids, etc.) []  - 0 Specimen(s) / Culture(s) sent or taken to Lab for analysis []  - 0 Patient Transfer (multiple staff / Civil Service fast streamer / Similar devices) []  - 0 Simple Staple / Suture removal (25 or less) []  - 0 Complex Staple / Suture removal (26 or more) []  - 0 Hypo / Hyperglycemic Management (close monitor of Blood Glucose) []  - 0 Ankle / Brachial Index (ABI) - do not check if billed separately X- 1 5 Vital Signs [name], [name] ([id number]) Has the patient been seen at the hospital within the last three years: Yes Total Score: 70 Level Of Care: New/Established - Level 2 Electronic Signature(s) Signed: 03/26/2019 4:33:39 PM By: Gretta Cool, BSN, RN, CWS, Kim RN, BSN Entered By: Gretta Cool, BSN, RN, CWS, Kim on 03/26/2019 12:35:10 [name] ([id number]) -------------------------------------------------------------------------------- Encounter Discharge Information Details Patient Name: [name] Date of Service: 03/25/2019 11:00 AM Medical Record Number: [id number] Patient Account Number: [id number] Date of Birth/Sex:  December 22, 1950 (69 y.o. F) Treating RN: Cornell Barman Primary Care Phylliss Strege: Harrel Lemon Other Clinician: Referring Arlone Lenhardt: Harrel Lemon Treating Lenae Wherley/Extender: Melburn Hake, HOYT Weeks in Treatment: 58 Encounter Discharge Information Items Discharge Condition: Stable Ambulatory Status: Ambulatory Discharge Destination: Home Transportation: Private Auto Accompanied By: daughter Schedule Follow-up Appointment: Yes Clinical Summary of Care: Electronic Signature(s) Signed: 03/26/2019 4:33:39 PM By: Gretta Cool, BSN, RN, CWS, Kim RN, BSN Entered By: Gretta Cool, BSN, RN, CWS, Kim on 03/25/2019 11:36:51 [name] ([id number]) -------------------------------------------------------------------------------- Lower Extremity Assessment Details Patient Name: [name] Date of Service: 03/25/2019 11:00 AM Medical Record Number: [id number] Patient Account Number: [id number] Date of Birth/Sex: 1950-05-06 (68 y.o. F) Treating RN: Army Melia Primary Care Arliss Hepburn: Harrel Lemon Other Clinician: Referring Shawna Wearing: Harrel Lemon Treating Mckinsley Koelzer/Extender: Melburn Hake, HOYT Weeks in Treatment: 17 Electronic Signature(s) Signed: 03/25/2019 1:06:11 PM By: Army Melia Entered By: Army Melia on 03/25/2019 11:08:43 [name], [name] ([id number]) -------------------------------------------------------------------------------- Multi Wound Chart Details Patient Name: [name] Date of Service: 03/25/2019 11:00 AM Medical Record Number: [id number] Patient Account Number: [id number] Date of Birth/Sex: [birth year] (68 y.o. F) Treating RN: Cornell Barman Primary Care Emidio Warrell: Harrel Lemon Other Clinician: Referring Anaih Brander: Harrel Lemon Treating Devron Cohick/Extender: Melburn Hake, HOYT Weeks in Treatment: 17 Vital Signs Height(in): 59 Pulse(bpm): 75 Weight(lbs): 193 Blood Pressure(mmHg): 113/59 Body Mass Index(BMI): 39 Temperature(F): 98.6 Respiratory Rate 16 (breaths/min): Photos:  [N/A:N/A] Wound Location: Left Upper Leg - Medial Right Upper Leg - Posterior N/A Wounding Event: Gradually Appeared Gradually Appeared N/A Primary Etiology: MASD MASD N/A Comorbid History: Hypertension, Dementia, Hypertension, Dementia, N/A Neuropathy Neuropathy Date Acquired: 12/31/2018 01/24/2019 N/A Weeks of Treatment: 11 8 N/A Wound Status: Open Open N/A Clustered Wound: Yes No N/A Clustered Quantity: 2 1 N/A Measurements L x W x D 0.9x2.2x0.1 0.6x0.6x0.1 N/A (cm) Area (cm) : 1.555 0.283 N/A Volume (cm) : 0.156 0.028 N/A % Reduction in Area: -23.70% -28.60% N/A % Reduction in  Volume: -23.80% -27.30% N/A Classification: Full Thickness Without Full Thickness Without N/A Exposed Support Structures Exposed Support Structures Exudate Amount: Medium Medium N/A Exudate Type: Serosanguineous Serous N/A Exudate Color: red, brown amber N/A Wound Margin: Flat and Intact Flat and Intact N/A Granulation Amount: Medium (34-66%) Large (67-100%) N/A Granulation Quality: Red Pink N/A Necrotic Amount: Medium (34-66%) None Present (0%) N/A Exposed Structures: Fat Layer (Subcutaneous Fat Layer (Subcutaneous N/A Tissue) Exposed: Yes Tissue) Exposed: Yes Fascia: No Fascia: No Tendon: No Tendon: No [name], [name] ([id number]) Muscle: No Muscle: No Joint: No Joint: No Bone: No Bone: No Epithelialization: Small (1-33%) None N/A Treatment Notes Electronic Signature(s) Signed: 03/26/2019 4:33:39 PM By: Gretta Cool, BSN, RN, CWS, Kim RN, BSN Entered By: Gretta Cool, BSN, RN, CWS, Kim on 03/25/2019 11:34:48 [name] ([id number]) -------------------------------------------------------------------------------- Multi-Disciplinary Care Plan Details Patient Name: [name] Date of Service: 03/25/2019 11:00 AM Medical Record Number: [id number] Patient Account Number: [id number] Date of Birth/Sex: 1950/10/31 (68 y.o. F) Treating RN: Cornell Barman Primary Care Shatyra Becka: Harrel Lemon Other  Clinician: Referring Leanette Eutsler: Harrel Lemon Treating Marvie Calender/Extender: Melburn Hake, HOYT Weeks in Treatment: 10 Active Inactive Abuse / Safety / Falls / Self Care Management Nursing Diagnoses: Potential for falls Goals: Patient/caregiver will demonstrate safe use of adaptive devices to increase mobility Date Initiated: 11/22/2018 Target Resolution Date: 12/21/2018 Goal Status: Active Interventions: Assess: immobility, friction, shearing, incontinence upon admission and as needed Notes: Orientation to the Wound Care Program Nursing Diagnoses: Knowledge deficit related to the wound healing center program Goals: Patient/caregiver will verbalize understanding of the [place] Program Date Initiated: 11/22/2018 Target Resolution Date: 12/21/2018 Goal Status: Active Interventions: Provide education on orientation to the wound center Notes: Pressure Nursing Diagnoses: Knowledge deficit related to management of pressures ulcers Goals: Patient/caregiver will verbalize risk factors for pressure ulcer development Date Initiated: 11/22/2018 Target Resolution Date: 12/21/2018 Goal Status: Active Interventions: Assess: immobility, friction, shearing, incontinence upon admission and as needed [name], [name] ([id number]) Notes: Wound/Skin Impairment Nursing Diagnoses: Impaired tissue integrity Goals: Ulcer/skin breakdown will have a volume reduction of 30% by week 4 Date Initiated: 11/22/2018 Target Resolution Date: 12/21/2018 Goal Status: Active Interventions: Assess ulceration(s) every visit Notes: Electronic Signature(s) Signed: 03/26/2019 4:33:39 PM By: Gretta Cool, BSN, RN, CWS, Kim RN, BSN Entered By: Gretta Cool, BSN, RN, CWS, Kim on 03/25/2019 11:34:39 [name] ([id number]) -------------------------------------------------------------------------------- Pain Assessment Details Patient Name: [name] Date of Service: 03/25/2019 11:00 AM Medical Record Number:  [id number] Patient Account Number: [id number] Date of Birth/Sex: 1950-05-06 (68 y.o. F) Treating RN: Cornell Barman Primary Care Aaralynn Shepheard: Harrel Lemon Other Clinician: Referring Anzlee Hinesley: Harrel Lemon Treating Joud Pettinato/Extender: Melburn Hake, HOYT Weeks in Treatment: 17 Active Problems Location of Pain Severity and Description of Pain Patient Has Paino No Site Locations Pain Management and Medication Current Pain Management: Electronic Signature(s) Signed: 03/25/2019 4:25:10 PM By: Lorine Bears RCP, RRT, CHT Signed: 03/26/2019 4:33:39 PM By: Gretta Cool, BSN, RN, CWS, Kim RN, BSN Entered By: Lorine Bears on 03/25/2019 11:02:00 [name], [name] ([id number]) -------------------------------------------------------------------------------- Wound Assessment Details Patient Name: [name] Date of Service: 03/25/2019 11:00 AM Medical Record Number: [id number] Patient Account Number: [id number] Date of Birth/Sex: 1950/10/17 (68 y.o. F) Treating RN: Army Melia Primary Care Lizabeth Fellner: Harrel Lemon Other Clinician: Referring Leyan Branden: Harrel Lemon Treating Sisto Granillo/Extender: Melburn Hake, HOYT Weeks in Treatment: 17 Wound Status Wound Number: 4 Primary Etiology: MASD Wound Location: Left Upper Leg - Medial Wound Status: Open Wounding Event: Gradually Appeared Comorbid History: Hypertension, Dementia, Neuropathy Date Acquired: 12/31/2018 Weeks Of Treatment: 11 Clustered Wound: Yes  Photos Wound Measurements Length: (cm) 0.9 % Reduct Width: (cm) 2.2 % Reduct Depth: (cm) 0.1 Epitheli Clustered Quantity: 2 Tunnelin Area: (cm) 1.555 Undermi Volume: (cm) 0.156 ion in Area: -23.7% ion in Volume: -23.8% alization: Small (1-33%) g: No ning: No Wound Description Full Thickness Without Exposed Support Foul Odo Classification: Structures Slough/F Wound Margin: Flat and Intact Exudate Medium Amount: Exudate Type: Serosanguineous Exudate Color: red,  brown r After Cleansing: No ibrino Yes Wound Bed Granulation Amount: Medium (34-66%) Exposed Structure Granulation Quality: Red Fascia Exposed: No Necrotic Amount: Medium (34-66%) Fat Layer (Subcutaneous Tissue) Exposed: Yes Necrotic Quality: Adherent Slough Tendon Exposed: No Muscle Exposed: No Joint Exposed: No Bone Exposed: No [name], [name] ([id number]) Treatment Notes Wound #4 (Left, Medial Upper Leg) Notes Silver cell, BFD Electronic Signature(s) Signed: 03/25/2019 1:06:11 PM By: Army Melia Entered By: Army Melia on 03/25/2019 11:08:05 [name] ([id number]) -------------------------------------------------------------------------------- Wound Assessment Details Patient Name: [name] Date of Service: 03/25/2019 11:00 AM Medical Record Number: [id number] Patient Account Number: [id number] Date of Birth/Sex: Feb 17, 1950 (68 y.o. F) Treating RN: Army Melia Primary Care Sladen Plancarte: Harrel Lemon Other Clinician: Referring Emani Taussig: Harrel Lemon Treating Labella Zahradnik/Extender: Melburn Hake, HOYT Weeks in Treatment: 17 Wound Status Wound Number: 6 Primary Etiology: MASD Wound Location: Right Upper Leg - Posterior Wound Status: Open Wounding Event: Gradually Appeared Comorbid History: Hypertension, Dementia, Neuropathy Date Acquired: 01/24/2019 Weeks Of Treatment: 8 Clustered Wound: No Photos Wound Measurements Length: (cm) 0.6 Width: (cm) 0.6 Depth: (cm) 0.1 Clustered Quantity: 1 Area: (cm) 0.283 Volume: (cm) 0.028 % Reduction in Area: -28.6% % Reduction in Volume: -27.3% Epithelialization: None Wound Description Full Thickness Without Exposed Support Foul Odo Classification: Structures Slough/F Wound Margin: Flat and Intact Exudate Medium Amount: Exudate Type: Serous Exudate Color: amber r After Cleansing: No ibrino No Wound Bed Granulation Amount: Large (67-100%) Exposed Structure Granulation Quality: Pink Fascia Exposed: No Necrotic  Amount: None Present (0%) Fat Layer (Subcutaneous Tissue) Exposed: Yes Tendon Exposed: No Muscle Exposed: No Joint Exposed: No Bone Exposed: No [name], [name] ([id number]) Treatment Notes Wound #6 (Right, Posterior Upper Leg) Notes Silver cell, BFD Electronic Signature(s) Signed: 03/25/2019 1:06:11 PM By: Army Melia Entered By: Army Melia on 03/25/2019 11:08:26 [place] ([id number]) -------------------------------------------------------------------------------- Vitals Details Patient Name: [name] Date of Service: 03/25/2019 11:00 AM Medical Record Number: [id number] Patient Account Number: [id number] Date of Birth/Sex: March 15, 1950 (68 y.o. F) Treating RN: Cornell Barman Primary Care Cedarius Kersh: Harrel Lemon Other Clinician: Referring Tonika Eden: Harrel Lemon Treating Sparkles Mcneely/Extender: Melburn Hake, HOYT Weeks in Treatment: 17 Vital Signs Time Taken: 11:00 Temperature (F): 98.6 Height (in): 59 Pulse (bpm): 75 Weight (lbs): 193 Respiratory Rate (breaths/min): 16 Body Mass Index (BMI): 39 Blood Pressure (mmHg): 113/59 Reference Range: 80 - 120 mg / dl Electronic Signature(s) Signed: 03/25/2019 4:25:10 PM By: Lorine Bears RCP, RRT, CHT Entered By: Lorine Bears on 03/25/2019 11:02:37

## 2019-03-26 NOTE — Progress Notes (Signed)
[name], [name] ([id number]) Visit Report for 03/25/2019 Chief Complaint Document Details Patient Name: [name], [name] Date of Service: 03/25/2019 11:00 AM Medical Record Number: [id number] Patient Account Number: [id number] Date of Birth/Sex: [birth year] (68 y.o. F) Treating RN: Cornell Barman Primary Care Provider: Harrel Lemon Other Clinician: Referring Provider: Harrel Lemon Treating Provider/Extender: Melburn Hake, Keshauna Degraffenreid Weeks in Treatment: 17 Information Obtained from: Patient Chief Complaint Gluteal and bilateral upper leg ulcers Electronic Signature(s) Signed: 03/25/2019 12:57:36 PM By: Worthy Keeler PA-C Entered By: Worthy Keeler on 03/25/2019 12:57:36 [name] ([id number]) -------------------------------------------------------------------------------- HPI Details Patient Name: [name] Date of Service: 03/25/2019 11:00 AM Medical Record Number: [id number] Patient Account Number: [id number] Date of Birth/Sex: [birth year] (68 y.o. F) Treating RN: Cornell Barman Primary Care Provider: Harrel Lemon Other Clinician: Referring Provider: Harrel Lemon Treating Provider/Extender: Melburn Hake, Aveleen Nevers Weeks in Treatment: 17 History of Present Illness HPI Description: 11/22/2018 patient presents today for initial evaluation in our clinic concerning issues that she has been having with a wound in the left gluteal fold. This has been present since August and was noted initially when she was in the hospital. Fortunately there is no signs of active infection at this time. No fever chills noted. Up to this point DuoDERM has been utilized although I am not sure that is the best treatment option for her based on what I am seeing currently. This actually started as a result of moisture associated skin damage secondary to a C. difficile infection. The patient does have dementia. She is also status post having had a kidney transplant and does have hypertension as well. She does spend a lot of  her time during the day sitting which is probably a part of why this is having trouble healing although it is not really a pressure ulcer that may be contributing to the lack of significant and rapid improvement. She still is having diarrhea as well and is still on treatment for the C. difficile infection she is on Dificid. They have been using barrier cream in the gluteal region to try to help protect the area where the dressing was not. 11/29/2018 upon evaluation today patient does not appear to be doing significantly better at this time. She still had some somewhat softer stools but not really as much diarrhea as what she had when the C. difficile infection was not full cycle. Overall things seem to be doing better in that regard although she does have 2 new areas on evaluation today. There is no signs of active infection. No fevers, chills, nausea, vomiting, or diarrhea. 12/06/2018 on evaluation today patient actually appears to be doing better with regard to the wounds in the gluteal region in general. Overall the biggest issue has been that apparently one of the wounds was missed by the home health nurse and not noted to be open and its been getting stuck to her brief. Nonetheless other than that and even with that considered she still seems to actually be doing quite well as far as I am seeing today. 12/20/2018 on evaluation today patient appears to be doing very well in regard to her wounds. Fortunately there is no sign of active infection at this time which is great news. She has been tolerating the dressing changes without complication although [facility] is no longer coming out they just discontinued the patient's treatment all of a sudden standing that they were complete as far as what they could do. Nonetheless unfortunately the patient still has wounds open and has not  had any dressings to put on the area they have been doing the best they could until they came in here today.  There is no evidence of infection and things seem to be doing quite well. 01/03/2019 on evaluation today patient appears to be doing okay in some regards regarding her wounds. Specifically the main wound on the left gluteal fold in the right gluteal fold seems to be doing much better. The right gluteal fold in fact is pretty much healed. On the left this still has a little bit deeper area open at this point I think that still needs a bandage. Unfortunately she got different bandages than the silicone, Allevyn, bandages that we had recommended and ordered for her. For that reason she unfortunately does have some additional breakdown around where the bandages have been pulling at this time. This has caused several areas of skin breakdown unfortunately. Fortunately there is no signs of active infection at this time. No fevers, chills, nausea, vomiting, or diarrhea. 01/17/2019 on evaluation today patient appears to be having a lot of issues with skin tearing due to adhesive's from the border foam dressing that was sent from the medical supplier. With that being said we specifically ordered Allevyn dressings I am not sure why they sent the other and less it has a insurance issue with not actually covering the dressings that they feel like that is better for her. With that being said there does not appear to be any signs of active infection at this point which is good news. Nonetheless she is still having pain. She is seen with a caregiver here today. 01/24/2019 on evaluation today patient appears to be doing well with regard to her wounds. In fact she is significantly improved as compared to last week's evaluation. Having the right dressings, Allevyn, has been of utmost importance for her this does not seem to damage her skin which is excellent news. Overall the patient also does not seem to be having as much pain. She again is seen with her caregiver during the office visit today and her caregiver is  very pleased with how things seem to be progressing with the proper treatments. [name] ([id number]) 03/18/2019 patient is currently being seen after unfortunately having been in the hospital a couple of times since we last saw her for various reasons unrelated to her wounds. Unfortunately she does still have some of the wounds present at this time. There is no signs of active infection which is good news at least. Home health has been coming out to see her it sounds like they have been using silver alginate dressings on the area. She mainly has regions open on her upper leg/thigh region bilaterally there was an area in the gluteal region but this appears to likely be closed today. 03/25/2019 upon evaluation today patient appears to be doing very well with regard to her wounds. She has been tolerating the dressing changes and fortunately there is no signs of active infection at this time. No fevers, chills, nausea, vomiting, or diarrhea. The wounds on the left medial upper leg as well as the right posterior upper leg. Electronic Signature(s) Signed: 03/25/2019 12:58:01 PM By: Worthy Keeler PA-C Entered By: Worthy Keeler on 03/25/2019 12:58:00 MELINDA, GWINNER ([id number]) -------------------------------------------------------------------------------- Physical Exam Details Patient Name: [name] Date of Service: 03/25/2019 11:00 AM Medical Record Number: [id number] Patient Account Number: [id number] Date of Birth/Sex: 1950-02-18 (68 y.o. F) Treating RN: Cornell Barman Primary Care Provider: Harrel Lemon Other Clinician: Referring Provider:  JOHNSTON, JOHN Treating Provider/Extender: STONE III, Aylah Yeary Weeks in Treatment: 51 Constitutional Well-nourished and well-hydrated in no acute distress. Respiratory normal breathing without difficulty. Psychiatric this patient is able to make decisions and demonstrates good insight into disease process. Alert and Oriented x 3. pleasant and  cooperative. Notes Patient's wounds currently are showing signs of good granulation and in fact appear to be very healthy at this time and very pleased in this regard. Fortunately there is no significant maceration and I feel like she is managing quite readily. No need for sharp debridement today. She is seen today with her family member as well. Electronic Signature(s) Signed: 03/25/2019 12:58:19 PM By: Worthy Keeler PA-C Entered By: Worthy Keeler on 03/25/2019 12:58:18 HELAYNA, DUN ([id number]) -------------------------------------------------------------------------------- Physician Orders Details Patient Name: [name] Date of Service: 03/25/2019 11:00 AM Medical Record Number: [id number] Patient Account Number: [id number] Date of Birth/Sex: 06/24/1950 (68 y.o. F) Treating RN: Cornell Barman Primary Care Provider: Harrel Lemon Other Clinician: Referring Provider: Harrel Lemon Treating Provider/Extender: Melburn Hake, Janayla Marik Weeks in Treatment: 23 Verbal / Phone Orders: No Diagnosis Coding Wound Cleansing Wound #4 Left,Medial Upper Leg o Clean wound with Normal Saline. - in office o Cleanse wound with mild soap and water Wound #6 Right,Posterior Upper Leg o Clean wound with Normal Saline. - in office o Cleanse wound with mild soap and water Skin Barriers/Peri-Wound Care Wound #4 Left,Medial Upper Leg o Skin Prep - periwound Wound #6 Right,Posterior Upper Leg o Skin Prep - periwound Primary Wound Dressing Wound #4 Left,Medial Upper Leg o Silver Alginate Wound #6 Right,Posterior Upper Leg o Silver Alginate Secondary Dressing Wound #4 Left,Medial Upper Leg o Boardered Foam Dressing Wound #6 Right,Posterior Upper Leg o Boardered Foam Dressing Dressing Change Frequency Wound #4 Left,Medial Upper Leg o Other: - twice weekly Wound #6 Right,Posterior Upper Leg o Other: - twice weekly Follow-up Appointments Wound #4 Left,Medial Upper Leg o  Return Appointment in 1 week. Wound #6 Right,Posterior Upper Leg o Return Appointment in 1 week. [place], Duanna ([id number]) Off-Loading Wound #4 Left,Medial Upper Leg o Turn and reposition every 2 hours o Other: - memory foam cushion while sitting, rolled towel between legs when sitting, if possible. Wound #6 Right,Posterior Upper Leg o Turn and reposition every 2 hours o Other: - memory foam cushion while sitting, rolled towel between legs when sitting, if possible. Home Health Wound #4 Left,Medial Upper Leg o Continue Home Health Visits - [place] Nurse may visit PRN to address patientos wound care needs. o FACE TO FACE ENCOUNTER: MEDICARE and MEDICAID PATIENTS: I certify that this patient is under my care and that I had a face-to-face encounter that meets the physician face-to-face encounter requirements with this patient on this date. The encounter with the patient was in whole or in part for the following MEDICAL CONDITION: (primary reason for [place]) MEDICAL NECESSITY: I certify, that based on my findings, NURSING services are a medically necessary home health service. HOME BOUND STATUS: I certify that my clinical findings support that this patient is homebound (i.e., Due to illness or injury, pt requires aid of supportive devices such as crutches, cane, wheelchairs, walkers, the use of special transportation or the assistance of another person to leave their place of residence. There is a normal inability to leave the home and doing so requires considerable and taxing effort. Other absences are for medical reasons / religious services and are infrequent or of short duration when for other reasons). o If current dressing  causes regression in wound condition, may D/C ordered dressing product/s and apply Normal Saline Moist Dressing daily until next [place] / Other MD appointment. [place] of regression in  wound condition at [phone]. o Please direct any NON-WOUND related issues/requests for orders to patient's Primary Care Physician Wound #6 Right,Posterior Upper Leg o [place] Visits - [place] Nurse may visit PRN to address patientos wound care needs. o FACE TO FACE ENCOUNTER: MEDICARE and MEDICAID PATIENTS: I certify that this patient is under my care and that I had a face-to-face encounter that meets the physician face-to-face encounter requirements with this patient on this date. The encounter with the patient was in whole or in part for the following MEDICAL CONDITION: (primary reason for [place]) MEDICAL NECESSITY: I certify, that based on my findings, NURSING services are a medically necessary home health service. HOME BOUND STATUS: I certify that my clinical findings support that this patient is homebound (i.e., Due to illness or injury, pt requires aid of supportive devices such as crutches, cane, wheelchairs, walkers, the use of special transportation or the assistance of another person to leave their place of residence. There is a normal inability to leave the home and doing so requires considerable and taxing effort. Other absences are for medical reasons / religious services and are infrequent or of short duration when for other reasons). o If current dressing causes regression in wound condition, may D/C ordered dressing product/s and apply Normal Saline Moist Dressing daily until next [place] / Other MD appointment. [place] of regression in wound condition at [phone]. o Please direct any NON-WOUND related issues/requests for orders to patient's Primary Care Physician Electronic Signature(s) Signed: 03/25/2019 5:41:25 PM By: Worthy Keeler PA-C Signed: 03/26/2019 4:33:39 PM By: Gretta Cool, BSN, RN, CWS, Kim RN, BSN Entered By: Gretta Cool, BSN, RN, CWS, Kim on 03/25/2019 11:35:49 AMIREE, NO  ([id number]) -------------------------------------------------------------------------------- Problem List Details Patient Name: [name] Date of Service: 03/25/2019 11:00 AM Medical Record Number: [id number] Patient Account Number: [id number] Date of Birth/Sex: 17-Dec-1950 (68 y.o. 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 03/18/2019 No Yes of skin L97.111 Non-pressure chronic ulcer of right thigh limited to breakdown 03/18/2019 No Yes of skin F03.90 Unspecified dementia without behavioral disturbance 11/22/2018 No Yes Z94.0 Kidney transplant status 11/22/2018 No Yes I10 Essential (primary) hypertension 11/22/2018 No Yes Inactive Problems Resolved Problems Electronic Signature(s) Signed: 03/25/2019 12:57:30 PM By: Worthy Keeler PA-C Entered By: Worthy Keeler on 03/25/2019 12:57:29 [place], Hoyle Navarro ([id number]) -------------------------------------------------------------------------------- Progress Note Details Patient Name: [name] Date of Service: 03/25/2019 11:00 AM Medical Record Number: [id number] Patient Account Number: [id number] Date of Birth/Sex: 06-Jun-1950 (68 y.o. F) Treating RN: Cornell Barman Primary Care Provider: Harrel Lemon Other Clinician: Referring Provider: Harrel Lemon Treating Provider/Extender: Melburn Hake, Ociel Retherford Weeks in Treatment: 17 Subjective Chief Complaint Information obtained from Patient Gluteal and bilateral upper leg ulcers History of Present Illness (HPI) 11/22/2018 patient presents  today for initial evaluation in our clinic concerning issues that she has been having with a wound in the left gluteal fold. This  has been present since August and was noted initially when she was in the hospital. Fortunately there is no signs of active infection at this time. No fever chills noted. Up to this point DuoDERM has been utilized although I am not sure that is the best treatment option for her based on what I am seeing currently. This actually started as a result of moisture associated skin damage secondary to a C. difficile infection. The patient does have dementia. She is also status post having had a kidney transplant and does have hypertension as well. She does spend a lot of her time during the day sitting which is probably a part of why this is having trouble healing although it is not really a pressure ulcer that may be contributing to the lack of significant and rapid improvement. She still is having diarrhea as well and is still on treatment for the C. difficile infection she is on Dificid. They have been using barrier cream in the gluteal region to try to help protect the area where the dressing was not. 11/29/2018 upon evaluation today patient does not appear to be doing significantly better at this time. She still had some somewhat softer stools but not really as much diarrhea as what she had when the C. difficile infection was not full cycle. Overall things seem to be doing better in that regard although she does have 2 new areas on evaluation today. There is no signs of active infection. No fevers, chills, nausea, vomiting, or diarrhea. 12/06/2018 on evaluation today patient actually appears to be doing better with regard to the wounds in the gluteal region in general. Overall the biggest issue has been that apparently one of the wounds was missed by the home health nurse and not noted to be open and its been getting stuck to her brief. Nonetheless other than that and even  with that considered she still seems to actually be doing quite well as far as I am seeing today. 12/20/2018 on evaluation today patient appears to be doing very well in regard to her wounds. Fortunately there is no sign of active infection at this time which is great news. She has been tolerating the dressing changes without complication although [facility] is no longer coming out they just discontinued the patient's treatment all of a sudden standing that they were complete as far as what they could do. Nonetheless unfortunately the patient still has wounds open and has not had any dressings to put on the area they have been doing the best they could until they came in here today. There is no evidence of infection and things seem to be doing quite well. 01/03/2019 on evaluation today patient appears to be doing okay in some regards regarding her wounds. Specifically the main wound on the left gluteal fold in the right gluteal fold seems to be doing much better. The right gluteal fold in fact is pretty much healed. On the left this still has a little bit deeper area open at this point I think that still needs a bandage. Unfortunately she got different bandages than the silicone, Allevyn, bandages that we had recommended and ordered for her. For that reason she unfortunately does have some additional breakdown around where the bandages have been pulling at this time. This has caused several areas of skin breakdown unfortunately. Fortunately there is no signs of active infection at this time. No fevers, chills, nausea, vomiting, or diarrhea. 01/17/2019 on evaluation today patient appears to be having  a lot of issues with skin tearing due to adhesive's from the border foam dressing that was sent from the medical supplier. With that being said we specifically ordered Allevyn dressings I am not sure why they sent the other and less it has a insurance issue with not actually covering the dressings  that they feel like that is better for her. With that being said there does not appear to be any signs of active infection at this point which is good news. Nonetheless she is still having pain. She is seen with a caregiver here today. AUGUST, GOSSER ([id number]) 01/24/2019 on evaluation today patient appears to be doing well with regard to her wounds. In fact she is significantly improved as compared to last week's evaluation. Having the right dressings, Allevyn, has been of utmost importance for her this does not seem to damage her skin which is excellent news. Overall the patient also does not seem to be having as much pain. She again is seen with her caregiver during the office visit today and her caregiver is very pleased with how things seem to be progressing with the proper treatments. 03/18/2019 patient is currently being seen after unfortunately having been in the hospital a couple of times since we last saw her for various reasons unrelated to her wounds. Unfortunately she does still have some of the wounds present at this time. There is no signs of active infection which is good news at least. Home health has been coming out to see her it sounds like they have been using silver alginate dressings on the area. She mainly has regions open on her upper leg/thigh region bilaterally there was an area in the gluteal region but this appears to likely be closed today. 03/25/2019 upon evaluation today patient appears to be doing very well with regard to her wounds. She has been tolerating the dressing changes and fortunately there is no signs of active infection at this time. No fevers, chills, nausea, vomiting, or diarrhea. The wounds on the left medial upper leg as well as the right posterior upper leg. Objective Constitutional Well-nourished and well-hydrated in no acute distress. Vitals Time Taken: 11:00 AM, Height: 59 in, Weight: 193 lbs, BMI: 39, Temperature: 98.6 F, Pulse: 75 bpm,  Respiratory Rate: 16 breaths/min, Blood Pressure: 113/59 mmHg. Respiratory normal breathing without difficulty. Psychiatric this patient is able to make decisions and demonstrates good insight into disease process. Alert and Oriented x 3. pleasant and cooperative. General Notes: Patient's wounds currently are showing signs of good granulation and in fact appear to be very healthy at this time and very pleased in this regard. Fortunately there is no significant maceration and I feel like she is managing quite readily. No need for sharp debridement today. She is seen today with her family member as well. Integumentary (Hair, Skin) Wound #4 status is Open. Original cause of wound was Gradually Appeared. The wound is located on the Left,Medial Upper Leg. The wound measures 0.9cm length x 2.2cm width x 0.1cm depth; 1.555cm^2 area and 0.156cm^3 volume. There is Fat Layer (Subcutaneous Tissue) Exposed exposed. There is no tunneling or undermining noted. There is a medium amount of serosanguineous drainage noted. The wound margin is flat and intact. There is medium (34-66%) red granulation within the wound bed. There is a medium (34-66%) amount of necrotic tissue within the wound bed including Adherent Slough. Wound #6 status is Open. Original cause of wound was Gradually Appeared. The wound is located on the Right,Posterior Upper Leg. The  wound measures 0.6cm length x 0.6cm width x 0.1cm depth; 0.283cm^2 area and 0.028cm^3 volume. There is Fat Layer (Subcutaneous Tissue) Exposed exposed. There is a medium amount of serous drainage noted. The wound margin is flat and intact. There is large (67-100%) pink granulation within the wound bed. There is no necrotic tissue within the wound bed. 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 - in office Cleanse wound with mild soap and water Wound #6 Right,Posterior Upper Leg: Clean wound with Normal Saline. - in office Cleanse wound with mild soap and water Skin Barriers/Peri-Wound Care: Wound #4 Left,Medial Upper Leg: Skin Prep - periwound Wound #6 Right,Posterior Upper Leg: Skin Prep - periwound Primary Wound Dressing: Wound #4 Left,Medial Upper Leg: Silver Alginate Wound #6 Right,Posterior Upper Leg: Silver Alginate Secondary Dressing: Wound #4 Left,Medial Upper Leg: Boardered Foam Dressing Wound #6 Right,Posterior Upper Leg: Boardered Foam Dressing Dressing Change Frequency: Wound #4 Left,Medial Upper Leg: Other: - twice weekly Wound #6 Right,Posterior Upper Leg: Other: - twice weekly Follow-up Appointments: Wound #4 Left,Medial Upper Leg: Return Appointment in 1 week. Wound #6 Right,Posterior Upper Leg: Return Appointment in 1 week. Off-Loading: Wound #4 Left,Medial Upper Leg: Turn and reposition every 2 hours Other: - memory foam cushion while sitting, rolled towel between legs when sitting, if possible. [place], [place] ([id number]) Wound #6 Right,Posterior Upper Leg: Turn and reposition every 2 hours Other: - memory foam cushion while sitting, rolled towel between legs when sitting, if possible. Home Health: Wound #4 Left,Medial Upper Leg: Continue Home Health Visits - [place] Nurse may visit PRN to address patient s wound care needs. FACE TO FACE ENCOUNTER: MEDICARE and MEDICAID PATIENTS: I certify that this patient is under my care and that I had a face-to-face encounter that meets the physician face-to-face encounter requirements  with this patient on this date. The encounter with the patient was in whole or in part for the following MEDICAL CONDITION: (primary reason for [place]) MEDICAL NECESSITY: I certify, that based on my findings, NURSING services are a medically necessary home health service. HOME BOUND STATUS: I certify that my clinical findings support that this patient is homebound (i.e., Due to illness or injury, pt requires aid of supportive devices such as crutches, cane, wheelchairs, walkers, the use of special transportation or the assistance of another person to leave their place of residence. There is a normal inability to leave the home and doing so requires considerable and taxing effort. Other absences are for medical reasons / religious services and are infrequent or of short duration when for other reasons). If current dressing causes regression in wound condition, may D/C ordered dressing product/s and apply Normal Saline Moist Dressing daily until next [place] / Other MD appointment. [place] of regression in wound condition at [phone]. Please direct any NON-WOUND related issues/requests for orders to patient's Primary Care Physician Wound #6 Right,Posterior Upper Leg: [place] Nurse may visit PRN to address patient s wound care needs. FACE TO FACE ENCOUNTER: MEDICARE and MEDICAID PATIENTS: I certify that this patient is under my care  and that I had a face-to-face encounter that meets the physician face-to-face encounter requirements with this patient on this date. The encounter with the patient was in whole or in part for the following MEDICAL CONDITION: (primary reason for [place]) MEDICAL NECESSITY: I certify, that based on my findings, NURSING services are a medically necessary home health service. HOME BOUND STATUS: I certify that my clinical findings support that this patient is homebound (i.e., Due  to illness or injury, pt requires aid of supportive devices such as crutches, cane, wheelchairs, walkers, the use of special transportation or the assistance of another person to leave their place of residence. There is a normal inability to leave the home and doing so requires considerable and taxing effort. Other absences are for medical reasons / religious services and are infrequent or of short duration when for other reasons). If current dressing causes regression in wound condition, may D/C ordered dressing product/s and apply Normal Saline Moist Dressing daily until next [place] / Other MD appointment. [place] of regression in wound condition at [phone]. Please direct any NON-WOUND related issues/requests for orders to patient's Primary Care Physician 1. My suggestion currently is good to be that we continue with silver alginate dressing I still think this is doing well for her and I think it would be appropriate to continue. 2. I am also going to suggest that we continue with appropriate offloading that was discussed again with the patient in regard to keeping things under good control here. We will see patient back for reevaluation in 1 week here in the clinic. If anything worsens or changes patient will contact our office for additional recommendations. Electronic Signature(s) Signed: 03/25/2019 12:58:46 PM By: Worthy Keeler PA-C Entered By: Worthy Keeler on 03/25/2019 12:58:45 SHAKELIA, SCRIVNER ([id number]) -------------------------------------------------------------------------------- SuperBill Details Patient Name: [name] Date of Service: 03/25/2019 Medical Record Number: [id number] Patient Account Number: [id number] Date of Birth/Sex: 1950-10-15 (68 y.o. 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 F03.90 Unspecified dementia without behavioral disturbance Z94.0 Kidney transplant status I10 Essential (primary) hypertension Facility Procedures CPT4 Code: [id number] Description: [phone] - WOUND CARE VISIT-LEV 2 EST PT Modifier: Quantity: 1 Physician Procedures CPT4 Code: [id number] Description: 78938 - WC PHYS LEVEL 3 - EST PT ICD-10 Diagnosis Description L24.89 Irritant contact dermatitis due to other agents 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

## 2019-03-26 NOTE — Telephone Encounter (Signed)
Spoke with patient's daughter [name] regarding Palliative services and she was in agreement with this.  I have scheduled a Telephone Consult for 04/18/19 @ 2 PM

## 2019-04-01 ENCOUNTER — Encounter: Payer: Medicare Other | Admitting: Physician Assistant

## 2019-04-01 ENCOUNTER — Other Ambulatory Visit: Payer: Self-pay

## 2019-04-01 DIAGNOSIS — L2489 Irritant contact dermatitis due to other agents: Secondary | ICD-10-CM | POA: Diagnosis not present

## 2019-04-01 NOTE — Progress Notes (Addendum)
Sheri, Navarro ([id number]) Visit Report for 04/01/2019 Chief Complaint Document Details Patient Name: [name], [name] Date of Service: 04/01/2019 12:30 PM Medical Record Number: [id number] Patient Account Number: [id number] Date of Birth/Sex: 11/12/1950 (69 y.o. F) Treating RN: Cornell Barman Primary Care Provider: Harrel Lemon Other Clinician: Referring Provider: Harrel Lemon Treating Provider/Extender: Melburn Hake, HOYT Weeks in Treatment: 18 Information Obtained from: Patient Chief Complaint Gluteal and bilateral upper leg ulcers Electronic Signature(s) Signed: 04/01/2019 12:53:25 PM By: Worthy Keeler PA-C Entered By: Worthy Keeler on 04/01/2019 12:53:25 Partridge, [name] ([id number]) -------------------------------------------------------------------------------- HPI Details Patient Name: [name] Date of Service: 04/01/2019 12:30 PM Medical Record Number: [id number] Patient Account Number: [id number] Date of Birth/Sex: October 08, 1950 (68 y.o. F) Treating RN: Cornell Barman Primary Care Provider: Harrel Lemon Other Clinician: Referring Provider: Harrel Lemon Treating Provider/Extender: Melburn Hake, HOYT Weeks in Treatment: 18 History of Present Illness HPI Description: 11/22/2018 patient presents today for initial evaluation in our clinic concerning issues that she has been having with a wound in the left gluteal fold. This has been present since August and was noted initially when she was in the hospital. Fortunately there is no signs of active infection at this time. No fever chills noted. Up to this point DuoDERM has been utilized although I am not sure that is the best treatment option for her based on what I am seeing currently. This actually started as a result of moisture associated skin damage secondary to a C. difficile infection. The patient does have dementia. She is also status post having had a kidney transplant and does have hypertension as well. She does spend a  lot of her time during the day sitting which is probably a part of why this is having trouble healing although it is not really a pressure ulcer that may be contributing to the lack of significant and rapid improvement. She still is having diarrhea as well and is still on treatment for the C. difficile infection she is on Dificid. They have been using barrier cream in the gluteal region to try to help protect the area where the dressing was not. 11/29/2018 upon evaluation today patient does not appear to be doing significantly better at this time. She still had some somewhat softer stools but not really as much diarrhea as what she had when the C. difficile infection was not full cycle. Overall things seem to be doing better in that regard although she does have 2 new areas on evaluation today. There is no signs of active infection. No fevers, chills, nausea, vomiting, or diarrhea. 12/06/2018 on evaluation today patient actually appears to be doing better with regard to the wounds in the gluteal region in general. Overall the biggest issue has been that apparently one of the wounds was missed by the home health nurse and not noted to be open and its been getting stuck to her brief. Nonetheless other than that and even with that considered she still seems to actually be doing quite well as far as I am seeing today. 12/20/2018 on evaluation today patient appears to be doing very well in regard to her wounds. Fortunately there is no sign of active infection at this time which is great news. She has been tolerating the dressing changes without complication although [facility] is no longer coming out they just discontinued the patient's treatment all of a sudden standing that they were complete as far as what they could do. Nonetheless unfortunately the patient still has wounds open and has not  had any dressings to put on the area they have been doing the best they could until they came in here  today. There is no evidence of infection and things seem to be doing quite well. 01/03/2019 on evaluation today patient appears to be doing okay in some regards regarding her wounds. Specifically the main wound on the left gluteal fold in the right gluteal fold seems to be doing much better. The right gluteal fold in fact is pretty much healed. On the left this still has a little bit deeper area open at this point I think that still needs a bandage. Unfortunately she got different bandages than the silicone, Allevyn, bandages that we had recommended and ordered for her. For that reason she unfortunately does have some additional breakdown around where the bandages have been pulling at this time. This has caused several areas of skin breakdown unfortunately. Fortunately there is no signs of active infection at this time. No fevers, chills, nausea, vomiting, or diarrhea. 01/17/2019 on evaluation today patient appears to be having a lot of issues with skin tearing due to adhesive's from the border foam dressing that was sent from the medical supplier. With that being said we specifically ordered Allevyn dressings I am not sure why they sent the other and less it has a insurance issue with not actually covering the dressings that they feel like that is better for her. With that being said there does not appear to be any signs of active infection at this point which is good news. Nonetheless she is still having pain. She is seen with a caregiver here today. 01/24/2019 on evaluation today patient appears to be doing well with regard to her wounds. In fact she is significantly improved as compared to last week's evaluation. Having the right dressings, Allevyn, has been of utmost importance for her this does not seem to damage her skin which is excellent news. Overall the patient also does not seem to be having as much pain. She again is seen with her caregiver during the office visit today and her caregiver  is very pleased with how things seem to be progressing with the proper treatments. SYRIANA, CROSLIN ([id number]) 03/18/2019 patient is currently being seen after unfortunately having been in the hospital a couple of times since we last saw her for various reasons unrelated to her wounds. Unfortunately she does still have some of the wounds present at this time. There is no signs of active infection which is good news at least. Home health has been coming out to see her it sounds like they have been using silver alginate dressings on the area. She mainly has regions open on her upper leg/thigh region bilaterally there was an area in the gluteal region but this appears to likely be closed today. 03/25/2019 upon evaluation today patient appears to be doing very well with regard to her wounds. She has been tolerating the dressing changes and fortunately there is no signs of active infection at this time. No fevers, chills, nausea, vomiting, or diarrhea. The wounds on the left medial upper leg as well as the right posterior upper leg. 04/01/2019 upon evaluation today patient appears to be doing well with regard to her wounds. I feel like she is making good progress fracture seeing some improvement in his overall quality today. Hopefully this will continue to progress accordingly. Fortunately there is no signs of active infection at this time. Electronic Signature(s) Signed: 04/01/2019 1:00:46 PM By: Worthy Keeler PA-C Entered By: Joaquim Lai  IIIMargarita Grizzle on 04/01/2019 13:00:46 NAZARIAH, CADET ([id number]) -------------------------------------------------------------------------------- Physical Exam Details Patient Name: JAHNIYA, DUZAN Date of Service: 04/01/2019 12:30 PM Medical Record Number: [id number] Patient Account Number: [id number] Date of Birth/Sex: [birth year] (68 y.o. F) Treating RN: Cornell Barman Primary Care Provider: Harrel Lemon Other Clinician: Referring Provider: Harrel Lemon Treating  Provider/Extender: Melburn Hake, HOYT Weeks in Treatment: 18 Constitutional Obese and well-hydrated in no acute distress. Respiratory normal breathing without difficulty. Psychiatric this patient is able to make decisions and demonstrates good insight into disease process. Alert and Oriented x 3. pleasant and cooperative. Notes Patient's wounds do seem to be causing her some discomfort upon cleaning with saline and gauze. Fortunately there is no evidence of systemic infection which is good news there is also no local infection also good news and overall I think the biggest issue is just keeping things clean and covered as far as the dressings are concerned. We may switch to something such as Prisma at some point in the future right now this has been keeping things from being too moist I think that is probably still the best for now. Electronic Signature(s) Signed: 04/01/2019 1:01:17 PM By: Worthy Keeler PA-C Entered By: Worthy Keeler on 04/01/2019 13:01:16 [name] ([id number]) -------------------------------------------------------------------------------- Physician Orders Details Patient Name: [name] Date of Service: 04/01/2019 12:30 PM Medical Record Number: [id number] Patient Account Number: [id number] Date of Birth/Sex: [birth year] (68 y.o. 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 - in office o Cleanse wound with mild soap and water Wound #6 Right,Posterior Upper Leg o Clean wound with Normal Saline. - in office o Cleanse wound with mild soap and water Skin Barriers/Peri-Wound Care Wound #4 Left,Medial Upper Leg o Skin Prep - periwound Wound #6 Right,Posterior Upper Leg o Skin Prep - periwound Primary Wound Dressing Wound #4 Left,Medial Upper Leg o Silver Alginate Wound #6 Right,Posterior Upper Leg o Silver Alginate Secondary Dressing Wound #4 Left,Medial Upper Leg o Boardered Foam Dressing Wound #6 Right,Posterior Upper Leg o Boardered Foam Dressing Dressing Change Frequency [place], Starlette ([id number]) Wound #4 Left,Medial Upper Leg o Other: - twice weekly Wound #6 Right,Posterior Upper Leg o Other: - twice weekly Follow-up Appointments Wound #4 Left,Medial Upper Leg o Return Appointment in 1 week. Wound #6 Right,Posterior Upper Leg o Return Appointment in 1 week. Off-Loading Wound #4 Left,Medial Upper Leg o Turn and reposition every 2 hours o Other: - memory foam cushion while sitting, rolled towel between legs when sitting, if possible. Wound #6 Right,Posterior Upper Leg o Turn and reposition every 2 hours o Other: - memory foam cushion while sitting, rolled towel between legs when sitting, if possible. Home Health Wound #4 Left,Medial Upper Leg o Continue Home Health Visits - [place] Nurse may visit PRN to address patientos wound care needs. o FACE TO FACE ENCOUNTER: MEDICARE and MEDICAID PATIENTS: I certify that this patient is under my care and that I  had a face-to-face encounter that meets the physician face-to-face encounter requirements with this patient on this date. The encounter with the patient was in whole or in part for the following  MEDICAL CONDITION: (primary reason for [place]) MEDICAL NECESSITY: I certify, that based on my findings, NURSING services are a medically necessary home health service. HOME BOUND STATUS: I certify that my clinical findings support that this patient is homebound (i.e., Due to illness or injury, pt requires aid of supportive devices such as crutches, cane, wheelchairs, walkers, the use of special transportation or the assistance of another person to leave their place of residence. There is a normal inability to leave the home and doing so requires considerable and taxing effort. Other absences are for medical reasons / religious services and are infrequent or of short duration when for other reasons). o If current dressing causes regression in wound condition, may D/C ordered dressing product/s and apply Normal Saline Moist Dressing daily until next [place] / Other MD appointment. [place] of regression in wound condition at [phone]. o Please direct any NON-WOUND related issues/requests for orders to patient's Primary Care Physician Wound #6 Right,Posterior Upper Leg o [place] Visits - [place] Nurse may visit PRN to address patientos wound care needs. o FACE TO FACE ENCOUNTER: MEDICARE and MEDICAID PATIENTS: I certify that this patient is under my care and that I had a face-to-face encounter that meets the physician face-to-face encounter requirements with this patient on this date. The encounter with the patient was in whole or in part for the following MEDICAL CONDITION: (primary reason for [place]) MEDICAL NECESSITY: I certify, that based on my findings, NURSING services are a medically necessary home health service. HOME BOUND STATUS: I certify that my clinical findings support that this patient is homebound (i.e., Due to illness or injury, pt requires aid of supportive devices such as crutches, cane,  wheelchairs, walkers, the use of special transportation or the assistance of another person to leave their place of residence. There is a normal inability to leave the home and doing so requires considerable and taxing effort. Other absences are for medical reasons / religious services and are infrequent or of short duration when for other reasons). o If current dressing causes regression in wound condition, may D/C ordered dressing product/s and apply Normal Saline Moist Dressing daily until next [place] / Other MD appointment. [place] of regression in wound condition at [phone]. ERABELLA, KUIPERS ([id number]) o Please direct any NON-WOUND related issues/requests for orders to patient's Primary Care Physician Electronic Signature(s) Signed: 04/01/2019 4:18:43 PM By: Army Melia Signed: 04/01/2019 4:43:11 PM By: Worthy Keeler PA-C Entered By: Army Melia on 04/01/2019 12:55:56 KALAYAH, LESKE ([id number]) -------------------------------------------------------------------------------- Problem List Details Patient Name: [name] Date of Service: 04/01/2019 12:30 PM Medical Record Number: [id number] Patient Account Number: [id number] Date of Birth/Sex: February 25, 1950 (68 y.o. 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 of 03/18/2019 No Yes skin L97.121 Non-pressure chronic ulcer of left thigh limited to breakdown 03/18/2019 No Yes of skin L97.111 Non-pressure chronic ulcer of right thigh limited to breakdown 03/18/2019 No Yes of skin F03.90 Unspecified dementia without behavioral  disturbance 11/22/2018 No Yes Z94.0 Kidney transplant status 11/22/2018 No Yes I10 Essential (primary) hypertension 11/22/2018 No Yes Inactive Problems Resolved Problems Electronic Signature(s) Signed: 04/01/2019 12:53:10 PM By: Worthy Keeler PA-C Entered By: Worthy Keeler on 04/01/2019 12:53:10 Tyler, [name] ([id number]) -------------------------------------------------------------------------------- Progress Note Details Patient Name: [name] Date of Service: 04/01/2019 12:30 PM Medical Record Number: [id number] Patient Account Number: [id number] Date of Birth/Sex: May 21, 1950 (68 y.o. F) Treating RN: Cornell Barman Primary Care Provider: Harrel Lemon Other Clinician: Referring Provider: Harrel Lemon Treating Provider/Extender: Melburn Hake, HOYT Weeks in Treatment: 18 Subjective Chief Complaint Information obtained from Patient Gluteal and bilateral upper leg ulcers History of Present Illness (HPI) 11/22/2018 patient presents today for initial evaluation in our clinic concerning issues that she has been having with a wound in the left gluteal fold. This has been present since August and was noted initially when she was in the hospital. Fortunately there is no signs of active infection at this time. No fever chills noted. Up to this point DuoDERM has been utilized although I am not sure that is the best treatment option for her based on what I am seeing currently. This actually started as a result of moisture associated skin damage secondary to a C. difficile infection. The patient does have dementia. She is also status post having had a kidney transplant and does have hypertension as well. She does spend a lot of her time during the day sitting which is probably a part of why this is having trouble healing although it is not really a pressure ulcer that may be contributing to the lack of significant and rapid improvement. She still is having diarrhea as well and is still on  treatment for the C. difficile infection she is on Dificid. They have been using barrier cream in the gluteal region to try to help protect the area where the dressing was not. 11/29/2018 upon evaluation today patient does not appear to be doing significantly better at this time. She still had some somewhat softer stools but not really as much diarrhea as what she had when the C. difficile infection was not full cycle. Overall things seem to be doing better in that regard although she does have 2 new areas on evaluation today. There is no signs of active infection. No fevers, chills, nausea, vomiting, or diarrhea. 12/06/2018 on evaluation today patient actually appears to be doing better with regard to the wounds in the gluteal region in general. Overall the biggest issue has been that apparently one of the wounds was missed by the home health nurse and not noted to be open and its been getting stuck to her brief. Nonetheless other than that and even with that considered she still seems to actually be doing quite well as far as I am seeing today. 12/20/2018 on evaluation today patient appears to be doing very well in regard to her wounds. Fortunately there is no sign of active infection at this time which is great news. She has been tolerating the dressing changes without complication although [facility] home [facility] is no longer coming out they just discontinued the patient's treatment all of a sudden standing that they were complete as far as what they could do. Nonetheless unfortunately the patient still has wounds open and has not had any dressings to put on the area they have been doing the best they could until they came in here today. There is no evidence of infection and things seem to be doing quite well. 01/03/2019  on evaluation today patient appears to be doing okay in some regards regarding her wounds. Specifically the main wound on the left gluteal fold in the right gluteal fold seems to be  doing much better. The right gluteal fold in fact is pretty much healed. On the left this still has a little bit deeper area open at this point I think that still needs a bandage. Unfortunately she got different bandages than the silicone, Allevyn, bandages that we had recommended and ordered for her. For that reason she unfortunately does have some additional breakdown around where the bandages have been pulling at this time. This has caused several areas of skin breakdown unfortunately. Fortunately there is no signs of active infection at this time. No fevers, chills, nausea, vomiting, or diarrhea. 01/17/2019 on evaluation today patient appears to be having a lot of issues with skin tearing due to adhesive's from the border foam dressing that was sent from the medical supplier. With that being said we specifically ordered Allevyn dressings I am not sure why they sent the other and less it has a insurance issue with not actually covering the dressings that they feel like that is better for her. With that being said there does not appear to be any signs of active infection at this point which is good news. Nonetheless she is still having pain. She is seen with a caregiver here today. RENATTA, SHRIEVES ([id number]) 01/24/2019 on evaluation today patient appears to be doing well with regard to her wounds. In fact she is significantly improved as compared to last week's evaluation. Having the right dressings, Allevyn, has been of utmost importance for her this does not seem to damage her skin which is excellent news. Overall the patient also does not seem to be having as much pain. She again is seen with her caregiver during the office visit today and her caregiver is very pleased with how things seem to be progressing with the proper treatments. 03/18/2019 patient is currently being seen after unfortunately having been in the hospital a couple of times since we last saw her for various reasons unrelated  to her wounds. Unfortunately she does still have some of the wounds present at this time. There is no signs of active infection which is good news at least. Home health has been coming out to see her it sounds like they have been using silver alginate dressings on the area. She mainly has regions open on her upper leg/thigh region bilaterally there was an area in the gluteal region but this appears to likely be closed today. 03/25/2019 upon evaluation today patient appears to be doing very well with regard to her wounds. She has been tolerating the dressing changes and fortunately there is no signs of active infection at this time. No fevers, chills, nausea, vomiting, or diarrhea. The wounds on the left medial upper leg as well as the right posterior upper leg. 04/01/2019 upon evaluation today patient appears to be doing well with regard to her wounds. I feel like she is making good progress fracture seeing some improvement in his overall quality today. Hopefully this will continue to progress accordingly. Fortunately there is no signs of active infection at this time. Objective Constitutional Obese and well-hydrated in no acute distress. Vitals Time Taken: 12:45 PM, Height: 59 in, Weight: 193 lbs, BMI: 39, Temperature: 98.3 F, Pulse: 63 bpm, Respiratory Rate: 16 breaths/min, Blood Pressure: 115/60 mmHg. Respiratory normal breathing without difficulty. Psychiatric this patient is able to make decisions and demonstrates  good insight into disease process. Alert and Oriented x 3. pleasant and cooperative. General Notes: Patient's wounds do seem to be causing her some discomfort upon cleaning with saline and gauze. Fortunately there is no evidence of systemic infection which is good news there is also no local infection also good news and overall I think the biggest issue is just keeping things clean and covered as far as the dressings are concerned. We may switch to something such as Prisma at  some point in the future right now this has been keeping things from being too moist I think that is probably still the best for now. Integumentary (Hair, Skin) Wound #4 status is Open. Original cause of wound was Gradually Appeared. The wound is located on the Left,Medial Upper Leg. The wound measures 1.3cm length x 1.8cm width x 0.1cm depth; 1.838cm^2 area and 0.184cm^3 volume. There is Fat Layer (Subcutaneous Tissue) Exposed exposed. There is no tunneling or undermining noted. There is a medium amount of serosanguineous drainage noted. The wound margin is flat and intact. There is large (67-100%) red granulation within the wound bed. There is a small (1-33%) amount of necrotic tissue within the wound bed including [place]. [place], [place] ([id number]) Wound #6 status is Open. Original cause of wound was Gradually Appeared. The wound is located on the Right,Posterior Upper Leg. The wound measures 0.7cm length x 0.9cm width x 0.1cm depth; 0.495cm^2 area and 0.049cm^3 volume. There is Fat Layer (Subcutaneous Tissue) Exposed exposed. There is no tunneling or undermining noted. There is a medium amount of serous drainage noted. The wound margin is flat and intact. There is large (67-100%) pink granulation within the wound bed. There is no necrotic tissue within the wound bed. 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 - in office Cleanse wound with mild soap and water Wound #6 Right,Posterior Upper Leg: Clean wound with Normal Saline. - in  office Cleanse wound with mild soap and water Skin Barriers/Peri-Wound Care: Wound #4 Left,Medial Upper Leg: Skin Prep - periwound Wound #6 Right,Posterior Upper Leg: Skin Prep - periwound Primary Wound Dressing: Wound #4 Left,Medial Upper Leg: Silver Alginate Wound #6 Right,Posterior Upper Leg: Silver Alginate Secondary Dressing: Wound #4 Left,Medial Upper Leg: Boardered Foam Dressing Wound #6 Right,Posterior Upper Leg: Boardered Foam Dressing Dressing Change Frequency: Wound #4 Left,Medial Upper Leg: Other: - twice weekly Wound #6 Right,Posterior Upper Leg: Other: - twice weekly Follow-up Appointments: Wound #4 Left,Medial Upper Leg: ALAYZIA, PAVLOCK ([id number]) Return Appointment in 1 week. Wound #6 Right,Posterior Upper Leg: Return Appointment in 1 week. Off-Loading: Wound #4 Left,Medial Upper Leg: Turn and reposition every 2 hours Other: - memory foam cushion while sitting, rolled towel between legs when sitting, if possible. Wound #6 Right,Posterior Upper Leg: Turn and reposition every 2 hours Other: - memory foam cushion while sitting, rolled towel between legs when sitting, if possible. Home Health: Wound #4 Left,Medial Upper Leg: Continue Home Health Visits - [place] Nurse may visit PRN to address patient s wound care needs. FACE TO FACE ENCOUNTER: MEDICARE and MEDICAID PATIENTS: I certify that this patient is under my care and that I had a face-to-face encounter  that meets the physician face-to-face encounter requirements with this patient on this date. The encounter with the patient was in whole or in part for the following MEDICAL CONDITION: (primary reason for [place]) MEDICAL NECESSITY: I certify, that based on my findings, NURSING services are a medically necessary home health service. HOME BOUND STATUS: I certify that my clinical findings support that this patient is homebound (i.e., Due to illness or injury, pt requires aid of  supportive devices such as crutches, cane, wheelchairs, walkers, the use of special transportation or the assistance of another person to leave their place of residence. There is a normal inability to leave the home and doing so requires considerable and taxing effort. Other absences are for medical reasons / religious services and are infrequent or of short duration when for other reasons). If current dressing causes regression in wound condition, may D/C ordered dressing product/s and apply Normal Saline Moist Dressing daily until next [place] / Other MD appointment. [place] of regression in wound condition at [phone]. Please direct any NON-WOUND related issues/requests for orders to patient's Primary Care Physician Wound #6 Right,Posterior Upper Leg: [place] Nurse may visit PRN to address patient s wound care needs. FACE TO FACE ENCOUNTER: MEDICARE and MEDICAID PATIENTS: I certify that this patient is under my care and that I had a face-to-face encounter that meets the physician face-to-face encounter requirements with this patient on this date. The encounter with the patient was in whole or in part for the following MEDICAL CONDITION: (primary reason for [place]) MEDICAL NECESSITY: I certify, that based on my findings, NURSING services are a medically necessary home health service. HOME BOUND STATUS: I certify that my clinical findings support that this patient is homebound (i.e., Due to illness or injury, pt requires aid of supportive devices such as crutches, cane, wheelchairs, walkers, the use of special transportation or the assistance of another person to leave their place of residence. There is a normal inability to leave the home and doing so requires considerable and taxing effort. Other absences are for medical reasons / religious services and are infrequent or of short duration when for other  reasons). If current dressing causes regression in wound condition, may D/C ordered dressing product/s and apply Normal Saline Moist Dressing daily until next [place] / Other MD appointment. [place] of regression in wound condition at [phone]. Please direct any NON-WOUND related issues/requests for orders to patient's Primary Care Physician 1. We will continue with the silver alginate dressing I think that still probably the best thing to do. 2. I am also going to suggest that we continue to cover this with a border foam dressing that seems to also do well for her. 3. I would also suggest that we have her continue with appropriate offloading she should not be sitting for too long. At any given time. We will see patient back for reevaluation in 1 week here in the clinic. If anything worsens or changes patient will contact our office for additional recommendations. Electronic Signature(s) Signed: 04/01/2019 1:01:44 PM By: Leone Haven, Westyn ([id number]) Entered By: Worthy Keeler on 04/01/2019 13:01:44 KATHEY, SIMER ([id number]) -------------------------------------------------------------------------------- SuperBill Details Patient Name: [name] Date of Service: 04/01/2019 Medical Record Number: [id number] Patient Account Number: [id number] Date of Birth/Sex: January 22, 1951 (68 y.o. 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 F03.90 Unspecified dementia  without behavioral disturbance Z94.0 Kidney transplant status I10 Essential (primary) hypertension Facility Procedures CPT4 Code: 16109604 Description: 99213 - WOUND CARE VISIT-LEV 3 EST PT Modifier: Quantity: 1 Physician Procedures CPT4 Code: 5409811 Description: 91478 - WC PHYS LEVEL 3 - EST PT ICD-10 Diagnosis Description L24.89 Irritant contact dermatitis due to other agents L98.411 Non-pressure chronic ulcer of buttock limited to breakdown L97.121 Non-pressure chronic ulcer of left thigh limited  to breakdo L97.111 Non-pressure chronic ulcer of right thigh limited to breakd Modifier: of skin wn of skin own of skin Quantity: 1 Electronic Signature(s) Signed: 04/01/2019 1:03:00 PM By: Worthy Keeler PA-C Entered By: Worthy Keeler on 04/01/2019 13:02:59

## 2019-04-01 NOTE — Progress Notes (Signed)
[name] ([id number]) Visit Report for 04/01/2019 Arrival Information Details Patient Name: [name], [name] Date of Service: 04/01/2019 12:30 PM Medical Record Number: [id number] Patient Account Number: [id number] Date of Birth/Sex: Mar 15, 1950 (69 y.o. F) Treating RN: Cornell Barman Primary Care Alveda Vanhorne: Harrel Lemon Other Clinician: Referring Armand Preast: Harrel Lemon Treating Sheniece Ruggles/Extender: Melburn Hake, HOYT Weeks in Treatment: 18 Visit Information History Since Last Visit Added or deleted any medications: No Patient Arrived: Wheel Chair Any new allergies or adverse reactions: No Arrival Time: 12:39 Had a fall or experienced change in No Accompanied By: daughter activities of daily living that may affect Transfer Assistance: Manual risk of falls: Patient Identification Verified: Yes Signs or symptoms of abuse/neglect since last visito No Secondary Verification Process Completed: Yes Hospitalized since last visit: No Patient Requires Transmission-Based No Implantable device outside of the clinic excluding No Precautions: cellular tissue based products placed in the center Patient Has Alerts: Yes since last visit: Patient Alerts: NOT Has Dressing in Place as Prescribed: Yes Diabetic Pain Present Now: No Electronic Signature(s) Signed: 04/01/2019 4:18:59 PM By: Lorine Bears RCP, RRT, CHT Entered By: Lorine Bears on 04/01/2019 12:44:56 [name] ([id number]) -------------------------------------------------------------------------------- Clinic Level of Care Assessment Details Patient Name: [name] Date of Service: 04/01/2019 12:30 PM Medical Record Number: [id number] Patient Account Number: [id number] Date of Birth/Sex: 08/25/1950 (68 y.o. F) Treating RN: Army Melia Primary Care Bill Mcvey: Harrel Lemon Other Clinician: Referring Chike Farrington: Harrel Lemon Treating Jourden Gilson/Extender: Melburn Hake, HOYT Weeks in Treatment:  18 Clinic Level of Care Assessment Items TOOL 4 Quantity Score []  - Use when only an EandM is performed on FOLLOW-UP visit 0 ASSESSMENTS - Nursing Assessment / Reassessment X - Reassessment of Co-morbidities (includes updates in patient status) 1 10 X- 1 5 Reassessment of Adherence to Treatment Plan ASSESSMENTS - Wound and Skin Assessment / Reassessment []  - Simple Wound Assessment / Reassessment - one wound 0 X- 2 5 Complex Wound Assessment / Reassessment - multiple wounds []  - 0 Dermatologic / Skin Assessment (not related to wound area) ASSESSMENTS - Focused Assessment []  - Circumferential Edema Measurements - multi extremities 0 []  - 0 Nutritional Assessment / Counseling / Intervention []  - 0 Lower Extremity Assessment (monofilament, tuning fork, pulses) []  - 0 Peripheral Arterial Disease Assessment (using hand held doppler) ASSESSMENTS - Ostomy and/or Continence Assessment and Care []  - Incontinence Assessment and Management 0 []  - 0 Ostomy Care Assessment and Management (repouching, etc.) PROCESS - Coordination of Care X - Simple Patient / Family Education for ongoing care 1 15 []  - 0 Complex (extensive) Patient / Family Education for ongoing care []  - 0 Staff obtains Programmer, systems, Records, Test Results / Process Orders []  - 0 Staff telephones HHA, Nursing Homes / Clarify orders / etc []  - 0 Routine Transfer to another Facility (non-emergent condition) []  - 0 Routine Hospital Admission (non-emergent condition) []  - 0 New Admissions / Biomedical engineer / Ordering NPWT, Apligraf, etc. []  - 0 Emergency Hospital Admission (emergent condition) X- 1 10 Simple Discharge Coordination Marion, Chanae ([id number]) []  - 0 Complex (extensive) Discharge Coordination PROCESS - Special Needs []  - Pediatric / Minor Patient Management 0 []  - 0 Isolation Patient Management []  - 0 Hearing / Language / Visual special needs []  - 0 Assessment of Community assistance  (transportation, D/C planning, etc.) []  - 0 Additional assistance / Altered mentation []  - 0 Support Surface(s) Assessment (bed, cushion, seat, etc.) INTERVENTIONS - Wound Cleansing / Measurement []  - Simple Wound Cleansing - one wound 0 X- 2  5 Complex Wound Cleansing - multiple wounds X- 1 5 Wound Imaging (photographs - any number of wounds) []  - 0 Wound Tracing (instead of photographs) []  - 0 Simple Wound Measurement - one wound X- 2 5 Complex Wound Measurement - multiple wounds INTERVENTIONS - Wound Dressings []  - Small Wound Dressing one or multiple wounds 0 X- 2 15 Medium Wound Dressing one or multiple wounds []  - 0 Large Wound Dressing one or multiple wounds []  - 0 Application of Medications - topical []  - 0 Application of Medications - injection INTERVENTIONS - Miscellaneous []  - External ear exam 0 []  - 0 Specimen Collection (cultures, biopsies, blood, body fluids, etc.) []  - 0 Specimen(s) / Culture(s) sent or taken to Lab for analysis []  - 0 Patient Transfer (multiple staff / Civil Service fast streamer / Similar devices) []  - 0 Simple Staple / Suture removal (25 or less) []  - 0 Complex Staple / Suture removal (26 or more) []  - 0 Hypo / Hyperglycemic Management (close monitor of Blood Glucose) []  - 0 Ankle / Brachial Index (ABI) - do not check if billed separately []  - 0 Vital Signs RANE, Tymesha ([id number]) Has the patient been seen at the hospital within the last three years: Yes Total Score: 105 Level Of Care: New/Established - Level 3 Electronic Signature(s) Signed: 04/01/2019 4:18:43 PM By: Army Melia Entered By: Army Melia on 04/01/2019 12:57:14 [name] ([id number]) -------------------------------------------------------------------------------- Encounter Discharge Information Details Patient Name: [name] Date of Service: 04/01/2019 12:30 PM Medical Record Number: [id number] Patient Account Number: [id number] Date of Birth/Sex: Jun 30, 1950  (68 y.o. F) Treating RN: Army Melia Primary Care Traves Majchrzak: Harrel Lemon Other Clinician: Referring Hilde Churchman: Harrel Lemon Treating Lielle Vandervort/Extender: Melburn Hake, HOYT Weeks in Treatment: 103 Encounter Discharge Information Items Discharge Condition: Stable Ambulatory Status: Wheelchair Discharge Destination: Home Transportation: Private Auto Accompanied By: daughter Schedule Follow-up Appointment: Yes Clinical Summary of Care: Electronic Signature(s) Signed: 04/01/2019 4:18:43 PM By: Army Melia Entered By: Army Melia on 04/01/2019 12:58:06 [place] ([id number]) -------------------------------------------------------------------------------- Lower Extremity Assessment Details Patient Name: [name] Date of Service: 04/01/2019 12:30 PM Medical Record Number: [id number] Patient Account Number: [id number] Date of Birth/Sex: 1951-02-13 (68 y.o. F) Treating RN: Cornell Barman Primary Care Lavere Shinsky: Harrel Lemon Other Clinician: Referring Margaretann Abate: Harrel Lemon Treating Scorpio Fortin/Extender: Sharalyn Ink in Treatment: 18 Electronic Signature(s) Signed: 04/01/2019 12:52:57 PM By: Montey Hora Signed: 04/01/2019 5:08:08 PM By: Gretta Cool BSN, RN, CWS, Kim RN, BSN Entered By: Montey Hora on 04/01/2019 12:52:57 EVAMAE, ROWEN ([id number]) -------------------------------------------------------------------------------- Multi Wound Chart Details Patient Name: [name] Date of Service: 04/01/2019 12:30 PM Medical Record Number: [id number] Patient Account Number: [id number] Date of Birth/Sex: 12-15-1950 (68 y.o. F) Treating RN: Army Melia Primary Care Danh Bayus: Harrel Lemon Other Clinician: Referring Riely Baskett: Harrel Lemon Treating Malaky Tetrault/Extender: Melburn Hake, HOYT Weeks in Treatment: 18 Vital Signs Height(in): 59 Pulse(bpm): 45 Weight(lbs): 193 Blood Pressure(mmHg): 115/60 Body Mass Index(BMI): 39 Temperature(F): 98.3 Respiratory  Rate 16 (breaths/min): Photos: [N/A:N/A] Wound Location: Left Upper Leg - Medial Right Upper Leg - Posterior N/A Wounding Event: Gradually Appeared Gradually Appeared N/A Primary Etiology: MASD MASD N/A Comorbid History: Hypertension, Dementia, Hypertension, Dementia, N/A Neuropathy Neuropathy Date Acquired: 12/31/2018 01/24/2019 N/A Weeks of Treatment: 12 9 N/A Wound Status: Open Open N/A Clustered Wound: Yes No N/A Clustered Quantity: 1 1 N/A Measurements L x W x D 1.3x1.8x0.1 0.7x0.9x0.1 N/A (cm) Area (cm) : 1.838 0.495 N/A Volume (cm) : 0.184 0.049 N/A % Reduction in Area: -46.20% -125.00% N/A % Reduction in Volume: -46.00% -122.70% N/A  Classification: Full Thickness Without Full Thickness Without N/A Exposed Support Structures Exposed Support Structures Exudate Amount: Medium Medium N/A Exudate Type: Serosanguineous Serous N/A Exudate Color: red, brown amber N/A Wound Margin: Flat and Intact Flat and Intact N/A Granulation Amount: Large (67-100%) Large (67-100%) N/A Granulation Quality: Red Pink N/A Necrotic Amount: Small (1-33%) None Present (0%) N/A Exposed Structures: Fat Layer (Subcutaneous Fat Layer (Subcutaneous N/A Tissue) Exposed: Yes Tissue) Exposed: Yes Fascia: No Fascia: No Tendon: No Tendon: No Tome, Sonna ([id number]) Muscle: No Muscle: No Joint: No Joint: No Bone: No Bone: No Epithelialization: Small (1-33%) None N/A Treatment Notes Electronic Signature(s) Signed: 04/01/2019 4:18:43 PM By: Army Melia Entered By: Army Melia on 04/01/2019 12:54:56 [name] ([id number]) -------------------------------------------------------------------------------- Multi-Disciplinary Care Plan Details Patient Name: [name] Date of Service: 04/01/2019 12:30 PM Medical Record Number: [id number] Patient Account Number: [id number] Date of Birth/Sex: 1950/07/08 (68 y.o. F) Treating RN: Army Melia Primary Care Wilma Michaelson: Harrel Lemon Other  Clinician: Referring Monick Rena: Harrel Lemon Treating Edwing Figley/Extender: Melburn Hake, HOYT Weeks in Treatment: 22 Active Inactive Abuse / Safety / Falls / Self Care Management Nursing Diagnoses: Potential for falls Goals: Patient/caregiver will demonstrate safe use of adaptive devices to increase mobility Date Initiated: 11/22/2018 Target Resolution Date: 12/21/2018 Goal Status: Active Interventions: Assess: immobility, friction, shearing, incontinence upon admission and as needed Notes: Pressure Nursing Diagnoses: Knowledge deficit related to management of pressures ulcers Goals: Patient/caregiver will verbalize risk factors for pressure ulcer development Date Initiated: 11/22/2018 Target Resolution Date: 12/21/2018 Goal Status: Active Interventions: Assess: immobility, friction, shearing, incontinence upon admission and as needed Notes: Wound/Skin Impairment Nursing Diagnoses: Impaired tissue integrity Goals: Ulcer/skin breakdown will have a volume reduction of 30% by week 4 Date Initiated: 11/22/2018 Target Resolution Date: 12/21/2018 Goal Status: Active Interventions: Assess ulceration(s) every visit KAYIN, KETTERING ([id number]) Notes: Electronic Signature(s) Signed: 04/01/2019 4:18:43 PM By: Army Melia Entered By: Army Melia on 04/01/2019 12:54:37 [place] ([id number]) -------------------------------------------------------------------------------- Pain Assessment Details Patient Name: [name] Date of Service: 04/01/2019 12:30 PM Medical Record Number: [id number] Patient Account Number: [id number] Date of Birth/Sex: Jul 30, 1950 (68 y.o. F) Treating RN: Cornell Barman Primary Care Mckenize Mezera: Harrel Lemon Other Clinician: Referring Itzamara Casas: Harrel Lemon Treating Gerardo Territo/Extender: Melburn Hake, HOYT Weeks in Treatment: 42 Active Problems Location of Pain Severity and Description of Pain Patient Has Paino No Site Locations Pain Management and  Medication Current Pain Management: Electronic Signature(s) Signed: 04/01/2019 4:18:59 PM By: Lorine Bears RCP, RRT, CHT Signed: 04/01/2019 5:08:08 PM By: Gretta Cool, BSN, RN, CWS, Kim RN, BSN Entered By: Lorine Bears on 04/01/2019 12:45:03 [name] ([id number]) -------------------------------------------------------------------------------- Patient/Caregiver Education Details Patient Name: [name] Date of Service: 04/01/2019 12:30 PM Medical Record Number: [id number] Patient Account Number: [id number] Date of Birth/Gender: [birth year] (68 y.o. F) Treating RN: Army Melia Primary Care Physician: Harrel Lemon Other Clinician: Referring Physician: Harrel Lemon Treating Physician/Extender: Sharalyn Ink in Treatment: 49 Education Assessment Education Provided To: Patient Education Topics Provided Wound/Skin Impairment: Handouts: Caring for Your Ulcer Methods: Demonstration, Explain/Verbal Responses: State content correctly Electronic Signature(s) Signed: 04/01/2019 4:18:43 PM By: Army Melia Entered By: Army Melia on 04/01/2019 12:57:35 [place] ([id number]) -------------------------------------------------------------------------------- Wound Assessment Details Patient Name: [name] Date of Service: 04/01/2019 12:30 PM Medical Record Number: [id number] Patient Account Number: [id number] Date of Birth/Sex: 08-Oct-1950 (68 y.o. F) Treating RN: Montey Hora Primary Care Branndon Tuite: Harrel Lemon Other Clinician: Referring Makensey Rego: Harrel Lemon Treating Darrelle Barrell/Extender: Melburn Hake, HOYT Weeks in Treatment: 18 Wound Status Wound Number: 4 Primary Etiology: MASD Wound Location:  Left Upper Leg - Medial Wound Status: Open Wounding Event: Gradually Appeared Comorbid History: Hypertension, Dementia, Neuropathy Date Acquired: 12/31/2018 Weeks Of Treatment: 12 Clustered Wound: Yes Photos Wound  Measurements Length: (cm) 1.3 % Reduct Width: (cm) 1.8 % Reduct Depth: (cm) 0.1 Epitheli Clustered Quantity: 1 Tunnelin Area: (cm) 1.838 Undermi Volume: (cm) 0.184 ion in Area: -46.2% ion in Volume: -46% alization: Small (1-33%) g: No ning: No Wound Description Full Thickness Without Exposed Support Classification: Structures Wound Margin: Flat and Intact Exudate Medium Amount: Exudate Type: Serosanguineous Exudate Color: red, brown Foul Odor After Cleansing: No Slough/Fibrino Yes Wound Bed Granulation Amount: Large (67-100%) Exposed Structure Granulation Quality: Red Fascia Exposed: No Necrotic Amount: Small (1-33%) Fat Layer (Subcutaneous Tissue) Exposed: Yes Necrotic Quality: Adherent Slough Tendon Exposed: No Muscle Exposed: No Joint Exposed: No Bone Exposed: No Fitzsimons, Taylee ([id number]) Treatment Notes Wound #4 (Left, Medial Upper Leg) Notes silver cell, BFD Electronic Signature(s) Signed: 04/01/2019 12:52:19 PM By: Montey Hora Entered By: Montey Hora on 04/01/2019 12:52:19 [name] ([id number]) -------------------------------------------------------------------------------- Wound Assessment Details Patient Name: [name] Date of Service: 04/01/2019 12:30 PM Medical Record Number: [id number] Patient Account Number: [id number] Date of Birth/Sex: [birth year] (68 y.o. F) Treating RN: Montey Hora Primary Care Demika Langenderfer: Harrel Lemon Other Clinician: Referring Benedetta Sundstrom: Harrel Lemon Treating Aiven Kampe/Extender: Melburn Hake, HOYT Weeks in Treatment: 18 Wound Status Wound Number: 6 Primary Etiology: MASD Wound Location: Right Upper Leg - Posterior Wound Status: Open Wounding Event: Gradually Appeared Comorbid History: Hypertension, Dementia, Neuropathy Date Acquired: 01/24/2019 Weeks Of Treatment: 9 Clustered Wound: No Photos Wound Measurements Length: (cm) 0.7 Width: (cm) 0.9 Depth: (cm) 0.1 Clustered Quantity: 1 Area:  (cm) 0.495 Volume: (cm) 0.049 % Reduction in Area: -125% % Reduction in Volume: -122.7% Epithelialization: None Tunneling: No Undermining: No Wound Description Full Thickness Without Exposed Support Classification: Structures Wound Margin: Flat and Intact Exudate Medium Amount: Exudate Type: Serous Exudate Color: amber Foul Odor After Cleansing: No Slough/Fibrino No Wound Bed Granulation Amount: Large (67-100%) Exposed Structure Granulation Quality: Pink Fascia Exposed: No Necrotic Amount: None Present (0%) Fat Layer (Subcutaneous Tissue) Exposed: Yes Tendon Exposed: No Muscle Exposed: No Joint Exposed: No Bone Exposed: No Livingood, Sofya ([id number]) Treatment Notes Wound #6 (Right, Posterior Upper Leg) Notes silver cell, BFD Electronic Signature(s) Signed: 04/01/2019 12:52:31 PM By: Montey Hora Entered By: Montey Hora on 04/01/2019 12:52:30 [name] ([id number]) -------------------------------------------------------------------------------- [place] Details Patient Name: [name] Date of Service: 04/01/2019 12:30 PM Medical Record Number: [id number] Patient Account Number: [id number] Date of Birth/Sex: 1950-12-04 (68 y.o. F) Treating RN: Cornell Barman Primary Care Nikalas Bramel: Harrel Lemon Other Clinician: Referring Justice Aguirre: Harrel Lemon Treating Edoardo Laforte/Extender: Melburn Hake, HOYT Weeks in Treatment: 18 Vital Signs Time Taken: 12:45 Temperature (F): 98.3 Height (in): 59 Pulse (bpm): 63 Weight (lbs): 193 Respiratory Rate (breaths/min): 16 Body Mass Index (BMI): 39 Blood Pressure (mmHg): 115/60 Reference Range: 80 - 120 mg / dl Electronic Signature(s) Signed: 04/01/2019 4:18:59 PM By: Lorine Bears RCP, RRT, CHT Entered By: Lorine Bears on 04/01/2019 12:45:34

## 2019-04-06 ENCOUNTER — Emergency Department
Admission: EM | Admit: 2019-04-06 | Discharge: 2019-04-06 | Disposition: A | Discharge status: Home / Self Care | Payer: Medicare Other | Attending: Student in an Organized Health Care Education/Training Program | Admitting: Student in an Organized Health Care Education/Training Program

## 2019-04-06 ENCOUNTER — Other Ambulatory Visit: Payer: Self-pay

## 2019-04-06 DIAGNOSIS — Z940 Kidney transplant status: Secondary | ICD-10-CM | POA: Insufficient documentation

## 2019-04-06 DIAGNOSIS — I129 Hypertensive chronic kidney disease with stage 1 through stage 4 chronic kidney disease, or unspecified chronic kidney disease: Secondary | ICD-10-CM | POA: Insufficient documentation

## 2019-04-06 DIAGNOSIS — Z79899 Other long term (current) drug therapy: Secondary | ICD-10-CM | POA: Diagnosis not present

## 2019-04-06 DIAGNOSIS — N184 Chronic kidney disease, stage 4 (severe): Secondary | ICD-10-CM | POA: Insufficient documentation

## 2019-04-06 DIAGNOSIS — Z91040 Latex allergy status: Secondary | ICD-10-CM | POA: Diagnosis not present

## 2019-04-06 DIAGNOSIS — R197 Diarrhea, unspecified: Secondary | ICD-10-CM

## 2019-04-06 DIAGNOSIS — Z85528 Personal history of other malignant neoplasm of kidney: Secondary | ICD-10-CM | POA: Insufficient documentation

## 2019-04-06 DIAGNOSIS — F0390 Unspecified dementia without behavioral disturbance: Secondary | ICD-10-CM | POA: Insufficient documentation

## 2019-04-06 DIAGNOSIS — Z87891 Personal history of nicotine dependence: Secondary | ICD-10-CM | POA: Insufficient documentation

## 2019-04-06 DIAGNOSIS — Z20822 Contact with and (suspected) exposure to covid-19: Secondary | ICD-10-CM | POA: Insufficient documentation

## 2019-04-06 LAB — COMPREHENSIVE METABOLIC PANEL
ALT: 6 U/L (ref 0–44)
AST: 11 U/L — ABNORMAL LOW (ref 15–41)
Albumin: 3.1 g/dL — ABNORMAL LOW (ref 3.5–5.0)
Alkaline Phosphatase: 56 U/L (ref 38–126)
Anion gap: 9 (ref 5–15)
BUN: 32 mg/dL — ABNORMAL HIGH (ref 8–23)
CO2: 22 mmol/L (ref 22–32)
Calcium: 8.8 mg/dL — ABNORMAL LOW (ref 8.9–10.3)
Chloride: 114 mmol/L — ABNORMAL HIGH (ref 98–111)
Creatinine, Ser: 3.99 mg/dL — ABNORMAL HIGH (ref 0.44–1.00)
GFR calc Af Amer: 13 mL/min — ABNORMAL LOW (ref >60)
GFR calc non Af Amer: 11 mL/min — ABNORMAL LOW (ref >60)
Glucose, Bld: 122 mg/dL — ABNORMAL HIGH (ref 70–99)
Potassium: 5.1 mmol/L (ref 3.5–5.1)
Sodium: 145 mmol/L (ref 135–145)
Total Bilirubin: 0.4 mg/dL (ref 0.3–1.2)
Total Protein: 7.2 g/dL (ref 6.5–8.1)

## 2019-04-06 LAB — LIPASE, BLOOD: Lipase: 17 U/L (ref 11–51)

## 2019-04-06 LAB — CBC
HCT: 26.8 % — ABNORMAL LOW (ref 36.0–46.0)
Hemoglobin: 7.7 g/dL — ABNORMAL LOW (ref 12.0–15.0)
MCH: 23.5 pg — ABNORMAL LOW (ref 26.0–34.0)
MCHC: 28.7 g/dL — ABNORMAL LOW (ref 30.0–36.0)
MCV: 81.7 fL (ref 80.0–100.0)
Platelets: 217 10*3/uL (ref 150–400)
RBC: 3.28 MIL/uL — ABNORMAL LOW (ref 3.87–5.11)
RDW: 16.4 % — ABNORMAL HIGH (ref 11.5–15.5)
WBC: 8.2 10*3/uL (ref 4.0–10.5)
nRBC: 0 % (ref 0.0–0.2)

## 2019-04-06 LAB — POC SARS CORONAVIRUS 2 AG: SARS Coronavirus 2 Ag: NEGATIVE

## 2019-04-06 MED ORDER — PROBIOTIC 250 MG PO CAPS: 1.0000 | ORAL_CAPSULE | Freq: Two times a day (BID) | ORAL | 0 refills | Status: AC

## 2019-04-06 MED ORDER — SODIUM CHLORIDE 0.9 % IV BOLUS
500.0000 mL | Freq: Once | INTRAVENOUS | Status: AC
  Administered 2019-04-06: 500 mL via INTRAVENOUS

## 2019-04-06 NOTE — ED Notes (Signed)
Daughter updated on pt condition after obtaining permission from pt Daughter request to be called upon pt discharge so they can pick pt up  [phone] Debarah Crape daughter

## 2019-04-06 NOTE — ED Triage Notes (Signed)
Pt arrives [facility] from home for "sluggish x few days and diarrhea." being treated with antibiotics x 1 week for UTI. VSS with EMS. CBG 112. Hx a fib, dementia.

## 2019-04-06 NOTE — ED Notes (Signed)
Pt wheeled to front to meet husband.

## 2019-04-06 NOTE — ED Provider Notes (Signed)
[facility] Emergency Department Provider Note    First MD Initiated Contact with Patient 04/06/19 1031     (approximate)  I have reviewed the triage vital signs and the nursing notes.   HISTORY  Chief Complaint Diarrhea    HPI [name] is a 69 y.o. female with significant past medical history including history of C. difficile colitis recently placed on antibiotics for UTI status post kidney transplant presents the ER for evaluation of weakness and diarrhea.  Patient unable to provide much additional history other than stating that she has been having loose stool and has had been on the antibiotics.  She denies any pain.  No fevers.    Past Medical History:  Diagnosis Date  . Anemia   . C. difficile colitis   . Cancer of kidney ([place])   . Dementia ([place])   . GERD (gastroesophageal reflux disease)   . Hypertension   . Renal disorder   . Uterine cancer ([place])    Family History  Problem Relation Age of Onset  . Stroke Mother   . Alcohol abuse Father   . Breast cancer Neg Hx    Past Surgical History:  Procedure Laterality Date  . ABDOMINAL HYSTERECTOMY    . AV FISTULA PLACEMENT    . BREAST BIOPSY     Patient is unsure but she thinks she remember a Bx  . BREAST BIOPSY Right 2017   benign  . KIDNEY TRANSPLANT    . NEPHRECTOMY TRANSPLANTED ORGAN     Patient Active Problem List   Diagnosis Date Noted  . Dehydration   . Acute-on-chronic kidney injury ([place]) 02/26/2019  . FTT (failure to thrive) in adult 02/26/2019  . Pressure injury of skin 02/04/2019  . AKI (acute kidney injury) ([place]) 02/01/2019  . Shaking 01/31/2019  . Hyperkalemia 01/31/2019  . Acute renal failure superimposed on stage 4 chronic kidney disease ([place]) 01/31/2019  . HLD (hyperlipidemia) 01/31/2019  . Acute lower UTI 11/07/2018  . C. difficile colitis 11/07/2018  . Acute encephalopathy 09/22/2018  . Renal failure (ARF), acute on chronic ([place]) 08/12/2018  . Confusion  03/23/2018  . Hypokalemia 06/01/2017  . Sepsis ([place]) 06/29/2016  . UTI (urinary tract infection) 06/29/2016  . GERD (gastroesophageal reflux disease) 06/29/2016  . Dementia ([place]) 06/29/2016  . 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 Essential hypertension 07/06/2014  . H/O kidney transplant 07/06/2014      Prior to Admission medications   Medication Sig Start Date End Date Taking? Authorizing Provider  amLODipine (NORVASC) 5 MG tablet Take 1 tablet (5 mg total) by mouth daily. New blood pressure medication. 03/06/19 04/05/19  Enzo Bi, MD  atorvastatin (LIPITOR) 40 MG tablet Take 40 mg by mouth at bedtime.     [provider]  calcitRIOL (ROCALTROL) 0.25 MCG capsule Take 0.25 mcg by mouth daily.    [provider]  calcium elemental as carbonate (TUMS ULTRA 1000) 400 MG chewable tablet Chew 1,000 mg by mouth 2 (two) times daily.     [provider]  cholecalciferol (VITAMIN D3) 25 MCG (1000 UT) tablet Take 1,000 Units by mouth daily.    [provider]  citalopram (CELEXA) 20 MG tablet Take 20 mg by mouth daily.    [provider]  donepezil (ARICEPT) 5 MG tablet Take 5 mg by mouth at bedtime.     [provider]  feeding supplement, ENSURE ENLIVE, (ENSURE ENLIVE) LIQD Take 237 mLs by mouth 2 (two) times daily between meals.  09/27/18   Saundra Shelling, MD  folic acid (FOLVITE) 1 MG tablet Take 1 mg by mouth 2 (two) times daily.     [provider]  furosemide (LASIX) 20 MG tablet Hold until follow up with kidney doctor. 03/06/19   Enzo Bi, MD  metoprolol tartrate (LOPRESSOR) 25 MG tablet Take 25 mg by mouth 2 (two) times daily.    [provider]  mycophenolate (MYFORTIC) 180 MG EC tablet Take 180 mg by mouth 2 (two) times daily.    [provider]  potassium chloride (KLOR-CON) 10 MEQ tablet Hold until follow up with kidney doctor 03/06/19   Enzo Bi, MD  Saccharomyces boulardii (PROBIOTIC) 250 MG CAPS Take 1  capsule by mouth 2 (two) times daily. 04/06/19   Merlyn Lot, MD  sirolimus (RAPAMUNE) 1 MG tablet Take 3 mg by mouth daily.     [provider]  traZODone (DESYREL) 50 MG tablet Take 50 mg by mouth at bedtime.  10/11/18 10/11/19  [provider]    Allergies Ace inhibitors, Latex, Lactose intolerance (gi), and Lisinopril    Social History Social History   Tobacco Use  . Smoking status: Former Research scientist (life sciences)  . Smokeless tobacco: Never Used  Substance Use Topics  . Alcohol use: No  . Drug use: No    Review of Systems Patient denies headaches, rhinorrhea, blurry vision, numbness, shortness of breath, chest pain, edema, cough, abdominal pain, nausea, vomiting, diarrhea, dysuria, fevers, rashes or hallucinations unless otherwise stated above in HPI. ____________________________________________   PHYSICAL EXAM:  VITAL SIGNS: Vitals:   04/06/19 1330 04/06/19 1400  BP: 117/63 116/63  Pulse: 98 (!) 59  Resp: 14 15  Temp:    SpO2: 98% 100%    Constitutional: Alert , pleasant and calm Eyes: Conjunctivae are normal.  Head: Atraumatic. Nose: No congestion/rhinnorhea. Mouth/Throat: Mucous membranes are moist.   Neck: No stridor. Painless ROM.  Cardiovascular: Normal rate, regular rhythm. Grossly normal heart sounds.  Good peripheral circulation. Respiratory: Normal respiratory effort.  No retractions. Lungs CTAB. Gastrointestinal: Soft and nontender. No distention. No abdominal bruits. No CVA tenderness. Genitourinary:  Musculoskeletal: No lower extremity tenderness nor edema.  No joint effusions. Neurologic:  Normal speech and language. No gross focal neurologic deficits are appreciated. No facial droop Skin:  Skin is warm, dry and intact. No rash noted. Psychiatric: Mood and affect are normal. Speech and behavior are normal.  ____________________________________________   LABS (all labs ordered are listed, but only abnormal results are displayed)  Results  for orders placed or performed during the hospital encounter of 04/06/19 (from the past 24 hour(s))  Lipase, blood     Status: None   Collection Time: 04/06/19 10:38 AM  Result Value Ref Range   Lipase 17 11 - 51 U/L  Comprehensive metabolic panel     Status: Abnormal   Collection Time: 04/06/19 10:38 AM  Result Value Ref Range   Sodium 145 135 - 145 mmol/L   Potassium 5.1 3.5 - 5.1 mmol/L   Chloride 114 (H) 98 - 111 mmol/L   CO2 22 22 - 32 mmol/L   Glucose, Bld 122 (H) 70 - 99 mg/dL   BUN 32 (H) 8 - 23 mg/dL   Creatinine, Ser 3.99 (H) 0.44 - 1.00 mg/dL   Calcium 8.8 (L) 8.9 - 10.3 mg/dL   Total Protein 7.2 6.5 - 8.1 g/dL   Albumin 3.1 (L) 3.5 - 5.0 g/dL   AST 11 (L) 15 - 41 U/L   ALT 6  0 - 44 U/L   Alkaline Phosphatase 56 38 - 126 U/L   Total Bilirubin 0.4 0.3 - 1.2 mg/dL   GFR calc non Af Amer 11 (L) >60 mL/min   GFR calc Af Amer 13 (L) >60 mL/min   Anion gap 9 5 - 15  CBC     Status: Abnormal   Collection Time: 04/06/19 10:38 AM  Result Value Ref Range   WBC 8.2 4.0 - 10.5 K/uL   RBC 3.28 (L) 3.87 - 5.11 MIL/uL   Hemoglobin 7.7 (L) 12.0 - 15.0 g/dL   HCT 26.8 (L) 36.0 - 46.0 %   MCV 81.7 80.0 - 100.0 fL   MCH 23.5 (L) 26.0 - 34.0 pg   MCHC 28.7 (L) 30.0 - 36.0 g/dL   RDW 16.4 (H) 11.5 - 15.5 %   Platelets 217 150 - 400 K/uL   nRBC 0.0 0.0 - 0.2 %  POC SARS Coronavirus 2 Ag     Status: None   Collection Time: 04/06/19 11:04 AM  Result Value Ref Range   SARS Coronavirus 2 Ag NEGATIVE NEGATIVE   ____________________________________________ ________________________  RADIOLOGY   ____________________________________________   PROCEDURES  Procedure(s) performed:  Procedures    Critical Care performed: no ____________________________________________   INITIAL IMPRESSION / ASSESSMENT AND PLAN / ED COURSE  Pertinent labs & imaging results that were available during my care of the patient were reviewed by me and considered in my medical decision making (see  chart for details).   DDX: dehydration, electrolyte abn, colitis, c dif  [name] is a 69 y.o. who presents to the ED with symptoms as described above.  Patient's not complaining of any discomfort at this time.  She does have extensive past medical history including history of C. difficile but her abdominal exam is soft and benign she is not febrile.  Will give gentle bolus of fluids.  Will check blood work and send stool for study.  Clinical Course as of Apr 05 1429  Sat Apr 06, 2019  1234 Patient's blood work is appear to be at its baseline.  She adamantly denies any abdominal pain.  She is not had any watery diarrhea.  Given lack of white count abdominal pain or diarrhea since being in the ER have a lower suspicion for C. difficile though she does have that history.  We will continue to observe.   [PR]  6237 Patient remains asymptomatic.  She has no complaints.  Has not had any diarrheal illness.  No nausea or vomiting.  She is tolerating p.o.  She denies any chest pain or pressure.  As her blood work is at baseline she is not having any symptoms at this time do believe she is appropriate for discharge home.  Instructed to return if she has recurrence of symptoms.  Instructed to follow up with PCP.  Have discussed with the patient and available family all diagnostics and treatments performed thus far and all questions were answered to the best of my ability. The patient demonstrates understanding and agreement with plan.    [PR]    Clinical Course User Index [PR] Merlyn Lot, MD    The patient was evaluated in Emergency Department today for the symptoms described in the history of present illness. He/she was evaluated in the context of the global COVID-19 pandemic, which necessitated consideration that the patient might be at risk for infection with the SARS-CoV-2 virus that causes COVID-19. Institutional protocols and algorithms that pertain to the evaluation of patients  at risk  for COVID-19 are in a state of rapid change based on information released by regulatory bodies including the CDC and federal and state organizations. These policies and algorithms were followed during the patient's care in the ED.  As part of my medical decision making, I reviewed the following data within the [place] notes reviewed and incorporated, Labs reviewed, notes from prior ED visits and [place] Controlled Substance Database   ____________________________________________   FINAL CLINICAL IMPRESSION(S) / ED DIAGNOSES  Final diagnoses:  Diarrhea, unspecified type      NEW MEDICATIONS STARTED DURING THIS VISIT:  New Prescriptions   SACCHAROMYCES BOULARDII (PROBIOTIC) 250 MG CAPS    Take 1 capsule by mouth 2 (two) times daily.     Note:  This document was prepared using Dragon voice recognition software and may include unintentional dictation errors.    Merlyn Lot, MD 04/06/19 [phone]

## 2019-04-06 NOTE — ED Notes (Signed)
Pt has not had a BM and reports that she does not need to have BM - per Dr Quentin Cornwall pt given food (peanut butter, graham crackers, coke) to see if BM can be induced

## 2019-04-06 NOTE — ED Notes (Signed)
Pt ate crackers and peanut butter and drank coke - still has had no BM and feels no urge to use restroom

## 2019-04-06 NOTE — ED Notes (Signed)
Pt's daughter [name] called and informed that pt is discharged. Daughter will call back with ETA.

## 2019-04-06 NOTE — Discharge Instructions (Signed)
Please take the probiotic medication I have prescribed twice daily.  If you start having worsening diarrhea, please return for a recheck.

## 2019-04-08 ENCOUNTER — Other Ambulatory Visit: Payer: Self-pay

## 2019-04-08 ENCOUNTER — Encounter: Payer: Medicare Other | Admitting: Physician Assistant

## 2019-04-08 DIAGNOSIS — L2489 Irritant contact dermatitis due to other agents: Secondary | ICD-10-CM | POA: Diagnosis not present

## 2019-04-08 NOTE — Progress Notes (Addendum)
[name], [name] ([id number]) Visit Report for 04/08/2019 Chief Complaint Document Details Patient Name: [name] Date of Service: 04/08/2019 12:30 PM Medical Record Number: [id number] Patient Account Number: [id number] Date of Birth/Sex: [birth year] (69 y.o. F) Treating RN: Cornell Barman Primary Care Provider: Harrel Lemon Other Clinician: Referring Provider: Harrel Lemon Treating Provider/Extender: Melburn Hake, HOYT Weeks in Treatment: 91 Information Obtained from: Patient Chief Complaint Gluteal and bilateral upper leg ulcers Electronic Signature(s) Signed: 04/08/2019 12:51:37 PM By: Worthy Keeler PA-C Entered By: Worthy Keeler on 04/08/2019 12:51:37 KATHYA, WILZ ([id number]) -------------------------------------------------------------------------------- HPI Details Patient Name: [name] Date of Service: 04/08/2019 12:30 PM Medical Record Number: [id number] Patient Account Number: [id number] Date of Birth/Sex: 02/27/1950 (68 y.o. F) Treating RN: Cornell Barman Primary Care Provider: Harrel Lemon Other Clinician: Referring Provider: Harrel Lemon Treating Provider/Extender: Melburn Hake, HOYT Weeks in Treatment: 5 History of Present Illness HPI Description: 11/22/2018 patient presents today for initial evaluation in our clinic concerning issues that she has been having with a wound in the left gluteal fold. This has been present since August and was noted initially when she was in the hospital. Fortunately there is no signs of active infection at this time. No fever chills noted. Up to this point DuoDERM has been utilized although I am not sure that is the best treatment option for her based on what I am seeing currently. This actually started as a result of moisture associated skin damage secondary to a C. difficile infection. The patient does have dementia. She is also status post having had a kidney transplant and does have hypertension as well. She does spend a lot  of her time during the day sitting which is probably a part of why this is having trouble healing although it is not really a pressure ulcer that may be contributing to the lack of significant and rapid improvement. She still is having diarrhea as well and is still on treatment for the C. difficile infection she is on Dificid. They have been using barrier cream in the gluteal region to try to help protect the area where the dressing was not. 11/29/2018 upon evaluation today patient does not appear to be doing significantly better at this time. She still had some somewhat softer stools but not really as much diarrhea as what she had when the C. difficile infection was not full cycle. Overall things seem to be doing better in that regard although she does have 2 new areas on evaluation today. There is no signs of active infection. No fevers, chills, nausea, vomiting, or diarrhea. 12/06/2018 on evaluation today patient actually appears to be doing better with regard to the wounds in the gluteal region in general. Overall the biggest issue has been that apparently one of the wounds was missed by the home health nurse and not noted to be open and its been getting stuck to her brief. Nonetheless other than that and even with that considered she still seems to actually be doing quite well as far as I am seeing today. 12/20/2018 on evaluation today patient appears to be doing very well in regard to her wounds. Fortunately there is no sign of active infection at this time which is great news. She has been tolerating the dressing changes without complication although [facility] is no longer coming out they just discontinued the patient's treatment all of a sudden standing that they were complete as far as what they could do. Nonetheless unfortunately the patient still has wounds open and has not  had any dressings to put on the area they have been doing the best they could until they came in here  today. There is no evidence of infection and things seem to be doing quite well. 01/03/2019 on evaluation today patient appears to be doing okay in some regards regarding her wounds. Specifically the main wound on the left gluteal fold in the right gluteal fold seems to be doing much better. The right gluteal fold in fact is pretty much healed. On the left this still has a little bit deeper area open at this point I think that still needs a bandage. Unfortunately she got different bandages than the silicone, Allevyn, bandages that we had recommended and ordered for her. For that reason she unfortunately does have some additional breakdown around where the bandages have been pulling at this time. This has caused several areas of skin breakdown unfortunately. Fortunately there is no signs of active infection at this time. No fevers, chills, nausea, vomiting, or diarrhea. 01/17/2019 on evaluation today patient appears to be having a lot of issues with skin tearing due to adhesive's from the border foam dressing that was sent from the medical supplier. With that being said we specifically ordered Allevyn dressings I am not sure why they sent the other and less it has a insurance issue with not actually covering the dressings that they feel like that is better for her. With that being said there does not appear to be any signs of active infection at this point which is good news. Nonetheless she is still having pain. She is seen with a caregiver here today. 01/24/2019 on evaluation today patient appears to be doing well with regard to her wounds. In fact she is significantly improved as compared to last week's evaluation. Having the right dressings, Allevyn, has been of utmost importance for her this does not seem to damage her skin which is excellent news. Overall the patient also does not seem to be having as much pain. She again is seen with her caregiver during the office visit today and her caregiver  is very pleased with how things seem to be progressing with the proper treatments. 03/18/2019 patient is currently being seen after unfortunately having been in the hospital a couple of times since we last saw her for various reasons unrelated to her wounds. Unfortunately she does still have some of the wounds present at this time. There is no signs of active infection which is good news at least. Home health has been coming out to see her it sounds like they have been using silver alginate dressings on the area. She mainly has regions open on her upper leg/thigh region bilaterally there was an area in the gluteal region but this appears to likely be closed today. 03/25/2019 upon evaluation today patient appears to be doing very well with regard to her wounds. She has been tolerating the dressing changes and fortunately there is no signs of active infection at this time. No fevers, chills, nausea, vomiting, or diarrhea. The wounds on the left medial upper leg as well as the right posterior upper leg. 04/01/2019 upon evaluation today patient appears to be doing well with regard to her wounds. I feel like she is making good progress fracture seeing some improvement in his overall quality today. Hopefully this will continue to progress accordingly. Fortunately there is no signs of active infection at this time. 04/08/2019 upon evaluation today patient actually appears to be doing better with regard to her wounds in  general. She is having much less pain and in fact did not really seem to experience any pain when I was cleaning the wounds today. This is great news and a big change even from last week. Electronic Signature(s) [place], [place] ([id number]) Signed: 04/08/2019 1:52:05 PM By: Worthy Keeler PA-C Entered By: Worthy Keeler on 04/08/2019 13:52:05 ARYONNA, GUNNERSON ([id number]) -------------------------------------------------------------------------------- Physical Exam Details Patient Name:  [name] Date of Service: 04/08/2019 12:30 PM Medical Record Number: [id number] Patient Account Number: [id number] Date of Birth/Sex: 03/10/1950 (68 y.o. F) Treating RN: Cornell Barman Primary Care Provider: Harrel Lemon Other Clinician: Referring Provider: Harrel Lemon Treating Provider/Extender: Melburn Hake, HOYT Weeks in Treatment: 26 Constitutional Well-nourished and well-hydrated in no acute distress. Respiratory normal breathing without difficulty. Psychiatric this patient is able to make decisions and demonstrates good insight into disease process. Alert and Oriented x 3. pleasant and cooperative. Notes Patient's wounds appear to be showing signs of improvement with new granulation and epithelization around the edges of the wounds as well. Fortunately there is no signs of active infection which is also good news. No fevers, chills, nausea, vomiting, or diarrhea. Electronic Signature(s) Signed: 04/08/2019 1:52:21 PM By: Worthy Keeler PA-C Entered By: Worthy Keeler on 04/08/2019 13:52:21 BRILEE, PORT ([id number]) -------------------------------------------------------------------------------- Physician Orders Details Patient Name: [name] Date of Service: 04/08/2019 12:30 PM Medical Record Number: [id number] Patient Account Number: [id number] Date of Birth/Sex: [birth year] (68 y.o. 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 - in office o Cleanse wound with mild soap and water Wound #6 Right,Posterior Upper Leg o Clean wound with Normal Saline. - in office o Cleanse wound with mild soap and water Skin Barriers/Peri-Wound Care Wound #4 Left,Medial Upper Leg o Skin Prep - periwound Wound #6 Right,Posterior Upper Leg o Skin Prep - periwound Primary Wound Dressing Wound #4 Left,Medial Upper Leg o Silver Alginate Wound #6 Right,Posterior Upper Leg o Silver Alginate Secondary Dressing Wound #4 Left,Medial Upper Leg o Boardered Foam Dressing Wound #6 Right,Posterior Upper Leg o Boardered Foam Dressing Dressing Change Frequency Wound #4 Left,Medial Upper Leg o Other: - twice weekly Wound #6 Right,Posterior Upper Leg o Other: - twice weekly Follow-up Appointments Wound #4 Left,Medial Upper Leg o Return Appointment in 1 week. ANNLOUISE, GERETY ([id number]) Wound #6 Right,Posterior Upper Leg o Return Appointment in 1 week. Off-Loading Wound #4 Left,Medial Upper Leg o Turn and reposition every 2 hours o Other: - memory foam cushion while sitting, rolled towel between legs when sitting, if possible. Wound #6 Right,Posterior Upper Leg o Turn and reposition every 2 hours o Other: - memory foam cushion while sitting, rolled towel between legs when sitting, if possible. Home Health Wound #4 Left,Medial Upper Leg o Continue Home Health Visits - [name] Nurse may visit PRN to address patientos wound care needs. o FACE TO FACE ENCOUNTER: MEDICARE and MEDICAID PATIENTS: I certify that this patient is under my care  and that I had a face-to- face encounter that meets the physician face-to-face encounter requirements with this patient on this date. The encounter with the patient was in  whole or in part for the following MEDICAL CONDITION: (primary reason for [place]) MEDICAL NECESSITY: I certify, that based on my findings, NURSING services are a medically necessary home health service. HOME BOUND STATUS: I certify that my clinical findings support that this patient is homebound (i.e., Due to illness or injury, pt requires aid of supportive devices such as crutches, cane, wheelchairs, walkers, the use of special transportation or the assistance of another person to leave their place of residence. There is a normal inability to leave the home and doing so requires considerable and taxing effort. Other absences are for medical reasons / religious services and are infrequent or of short duration when for other reasons). o If current dressing causes regression in wound condition, may D/C ordered dressing product/s and apply Normal Saline Moist Dressing daily until next [place] / Other MD appointment. Guys of regression in wound condition at [phone]. o Please direct any NON-WOUND related issues/requests for orders to patient's Primary Care Physician Wound #6 Right,Posterior Upper Leg o [place] Visits - [place] Nurse may visit PRN to address patientos wound care needs. o FACE TO FACE ENCOUNTER: MEDICARE and MEDICAID PATIENTS: I certify that this patient is under my care and that I had a face-to- face encounter that meets the physician face-to-face encounter requirements with this patient on this date. The encounter with the patient was in whole or in part for the following MEDICAL CONDITION: (primary reason for [place]) MEDICAL NECESSITY: I certify, that based on my findings, NURSING services are a medically necessary home health service. HOME BOUND STATUS: I certify that my clinical findings support that this patient is homebound (i.e., Due to illness or injury, pt requires aid of  supportive devices such as crutches, cane, wheelchairs, walkers, the use of special transportation or the assistance of another person to leave their place of residence. There is a normal inability to leave the home and doing so requires considerable and taxing effort. Other absences are for medical reasons / religious services and are infrequent or of short duration when for other reasons). o If current dressing causes regression in wound condition, may D/C ordered dressing product/s and apply Normal Saline Moist Dressing daily until next [place] / Other MD appointment. [place] of regression in wound condition at [phone]. o Please direct any NON-WOUND related issues/requests for orders to patient's Primary Care Physician Electronic Signature(s) Signed: 04/08/2019 5:29:33 PM By: Worthy Keeler PA-C Signed: 04/19/2019 5:40:44 PM By: Gretta Cool, BSN, RN, CWS, Kim RN, BSN Entered By: Gretta Cool, BSN, RN, CWS, Kim on 04/08/2019 12:55:04 MARCELYN, RUPPE ([id number]) -------------------------------------------------------------------------------- Problem List Details Patient Name: [name] Date of Service: 04/08/2019 12:30 PM Medical Record Number: [id number] Patient Account Number: [id number] Date of Birth/Sex: 1951/02/13 (68 y.o. 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 03/18/2019 No Yes L97.111 Non-pressure chronic ulcer of right thigh limited to breakdown  of skin 03/18/2019 No Yes F03.90  Unspecified dementia without behavioral disturbance 11/22/2018 No Yes Z94.0 Kidney transplant status 11/22/2018 No Yes I10 Essential (primary) hypertension 11/22/2018 No Yes Inactive Problems Resolved Problems Electronic Signature(s) Signed: 04/08/2019 12:51:30 PM By: Worthy Keeler PA-C Entered By: Worthy Keeler on 04/08/2019 12:51:30 Bradley, [name] ([id number]) -------------------------------------------------------------------------------- Progress Note Details Patient Name: [name] Date of Service: 04/08/2019 12:30 PM Medical Record Number: [id number] Patient Account Number: [id number] Date of Birth/Sex: Aug 05, 1950 (68 y.o. F) Treating RN: Cornell Barman Primary Care Provider: Harrel Lemon Other Clinician: Referring Provider: Harrel Lemon Treating Provider/Extender: Melburn Hake, HOYT Weeks in Treatment: 50 Subjective Chief Complaint Information obtained from Patient Gluteal and bilateral upper leg ulcers History of Present Illness (HPI) 11/22/2018 patient presents today for initial evaluation in our clinic concerning issues that she has been having with a wound in the left gluteal fold. This has been present since August and was noted initially when she was in the hospital. Fortunately there is no signs of active infection at this time. No fever chills noted. Up to this point DuoDERM has been utilized although I am not sure that is the best treatment option for her based on what I am seeing currently. This actually started as a result of moisture associated skin damage secondary to a C. difficile infection. The patient does have dementia. She is also status post having had a kidney transplant and does have hypertension as well. She does spend a lot of her time during the day sitting which is probably a part of why this is having trouble healing although it is not really a pressure ulcer that may be contributing to the lack of significant and rapid improvement. She still is  having diarrhea as well and is still on treatment for the C. difficile infection she is on Dificid. They have been using barrier cream in the gluteal region to try to help protect the area where the dressing was not. 11/29/2018 upon evaluation today patient does not appear to be doing significantly better at this time. She still had some somewhat softer stools but not really as much diarrhea as what she had when the C. difficile infection was not full cycle. Overall things seem to be doing better in that regard although she does have 2 new areas on evaluation today. There is no signs of active infection. No fevers, chills, nausea, vomiting, or diarrhea. 12/06/2018 on evaluation today patient actually appears to be doing better with regard to the wounds in the gluteal region in general. Overall the biggest issue has been that apparently one of the wounds was missed by the home health nurse and not noted to be open and its been getting stuck to her brief. Nonetheless other than that and even with that considered she still seems to actually be doing quite well as far as I am seeing today. 12/20/2018 on evaluation today patient appears to be doing very well in regard to her wounds. Fortunately there is no sign of active infection at this time which is great news. She has been tolerating the dressing changes without complication although [facility] is no longer coming out they just discontinued the patient's treatment all of a sudden standing that they were complete as far as what they could do. Nonetheless unfortunately the patient still has wounds open and has not had any dressings to put on the area they have been doing the best they could until they came in here today. There is no evidence  of infection and things seem to be doing quite well. 01/03/2019 on evaluation today patient appears to be doing okay in some regards regarding her wounds. Specifically the main wound on the left gluteal fold in  the right gluteal fold seems to be doing much better. The right gluteal fold in fact is pretty much healed. On the left this still has a little bit deeper area open at this point I think that still needs a bandage. Unfortunately she got different bandages than the silicone, Allevyn, bandages that we had recommended and ordered for her. For that reason she unfortunately does have some additional breakdown around where the bandages have been pulling at this time. This has caused several areas of skin breakdown unfortunately. Fortunately there is no signs of active infection at this time. No fevers, chills, nausea, vomiting, or diarrhea. 01/17/2019 on evaluation today patient appears to be having a lot of issues with skin tearing due to adhesive's from the border foam dressing that was sent from the medical supplier. With that being said we specifically ordered Allevyn dressings I am not sure why they sent the other and less it has a insurance issue with not actually covering the dressings that they feel like that is better for her. With that being said there does not appear to be any signs of active infection at this point which is good news. Nonetheless she is still having pain. She is seen with a caregiver here today. 01/24/2019 on evaluation today patient appears to be doing well with regard to her wounds. In fact she is significantly improved as compared to last week's evaluation. Having the right dressings, Allevyn, has been of utmost importance for her this does not seem to damage her skin which is excellent news. Overall the patient also does not seem to be having as much pain. She again is seen with her caregiver during the office visit today and her caregiver is very pleased with how things seem to be progressing with the proper treatments. 03/18/2019 patient is currently being seen after unfortunately having been in the hospital a couple of times since we last saw her for various reasons unrelated  to her wounds. Unfortunately she does still have some of the wounds present at this time. There is no signs of active infection which is good news at least. Home health has been coming out to see her it sounds like they have been using silver alginate dressings on the area. She mainly has regions open on her upper leg/thigh region bilaterally there was an area in the gluteal region but this appears to likely be closed today. 03/25/2019 upon evaluation today patient appears to be doing very well with regard to her wounds. She has been tolerating the dressing changes and fortunately there is no signs of active infection at this time. No fevers, chills, nausea, vomiting, or diarrhea. The wounds on the left medial upper leg as well as the right posterior upper leg. 04/01/2019 upon evaluation today patient appears to be doing well with regard to her wounds. I feel like she is making good progress fracture seeing some improvement in his overall quality today. Hopefully this will continue to progress accordingly. Fortunately there is no signs of active infection at this time. ARIANNY, PUN ([id number]) 04/08/2019 upon evaluation today patient actually appears to be doing better with regard to her wounds in general. She is having much less pain and in fact did not really seem to experience any pain when I was cleaning the  wounds today. This is great news and a big change even from last week. Objective Constitutional Well-nourished and well-hydrated in no acute distress. Vitals Time Taken: 12:36 PM, Height: 59 in, Weight: 193 lbs, BMI: 39, Temperature: 98.5 F, Pulse: 58 bpm, Respiratory Rate: 16 breaths/min, Blood Pressure: 125/59 mmHg. Respiratory normal breathing without difficulty. Psychiatric this patient is able to make decisions and demonstrates good insight into disease process. Alert and Oriented x 3. pleasant and cooperative. General Notes: Patient's wounds appear to be showing signs of  improvement with new granulation and epithelization around the edges of the wounds as well. Fortunately there is no signs of active infection which is also good news. No fevers, chills, nausea, vomiting, or diarrhea. Integumentary (Hair, Skin) Wound #4 status is Open. Original cause of wound was Gradually Appeared. The wound is located on the Left,Medial Upper Leg. The wound measures 1.1cm length x 1.4cm width x 0.1cm depth; 1.21cm^2 area and 0.121cm^3 volume. Wound #6 status is Open. Original cause of wound was Gradually Appeared. The wound is located on the Right,Posterior Upper Leg. The wound measures 0.4cm length x 0.8cm width x 0.1cm depth; 0.251cm^2 area and 0.025cm^3 volume. 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 - in office Cleanse wound with mild soap and water Wound #6 Right,Posterior Upper Leg: Clean wound with Normal Saline. - in office Cleanse wound with mild soap and water Skin Barriers/Peri-Wound Care: Wound #4 Left,Medial Upper Leg: LILLIONA, BLAKENEY ([id number]) Skin Prep - periwound Wound #6 Right,Posterior Upper Leg: Skin Prep - periwound Primary Wound Dressing: Wound #4 Left,Medial Upper Leg: Silver Alginate Wound #6 Right,Posterior Upper Leg: Silver Alginate Secondary Dressing: Wound #4 Left,Medial Upper Leg: Boardered Foam Dressing Wound #6 Right,Posterior Upper Leg: Boardered Foam Dressing Dressing Change Frequency: Wound #4 Left,Medial Upper Leg: Other: - twice weekly Wound #6 Right,Posterior Upper Leg: Other: -  twice weekly Follow-up Appointments: Wound #4 Left,Medial Upper Leg: Return Appointment in 1 week. Wound #6 Right,Posterior Upper Leg: Return Appointment in 1 week. Off-Loading: Wound #4 Left,Medial Upper Leg: Turn and reposition every 2 hours Other: - memory foam cushion while sitting, rolled towel between legs when sitting, if possible. Wound #6 Right,Posterior Upper Leg: Turn and reposition every 2 hours Other: - memory foam cushion while sitting, rolled towel between legs when sitting, if possible. Home Health: Wound #4 Left,Medial Upper Leg: Continue Home Health Visits - [place] Nurse may visit PRN to address patient s wound care needs. FACE TO FACE ENCOUNTER: MEDICARE and MEDICAID PATIENTS: I certify that this patient is under my care and that I had a face-to-face encounter that meets the physician face-to-face encounter requirements with this patient on this date. The encounter with the patient was in whole or in part for the following MEDICAL CONDITION: (primary reason for [place]) MEDICAL NECESSITY: I certify, that based on my findings, NURSING services are a medically necessary home health service. HOME BOUND STATUS: I certify that my clinical findings support that this patient is homebound (i.e., Due to illness or injury, pt requires aid of supportive devices such as crutches, cane, wheelchairs, walkers, the use of special transportation or the assistance of another person  to leave their place of residence. There is a normal inability to leave the home and doing so requires considerable and taxing effort. Other absences are for medical reasons / religious services and are infrequent or of short duration when for other reasons). If current dressing causes regression in wound condition, may D/C ordered dressing product/s and apply Normal Saline Moist Dressing daily until next [place] / Other MD appointment. [place] of  regression in wound condition at [phone]. Please direct any NON-WOUND related issues/requests for orders to patient's Primary Care Physician Wound #6 Right,Posterior Upper Leg: [place] Nurse may visit PRN to address patient s wound care needs. FACE TO FACE ENCOUNTER: MEDICARE and MEDICAID PATIENTS: I certify that this patient is under my care and that I had a face-to-face encounter that meets the physician face-to-face encounter requirements with this patient on this date. The encounter with the patient was in whole or in part for the following MEDICAL CONDITION: (primary reason for [place]) MEDICAL NECESSITY: I certify, that based on my findings, NURSING services are a medically necessary home health service. HOME BOUND STATUS: I certify that my clinical findings support that this patient is homebound (i.e., Due to illness or injury, pt requires aid of supportive devices such as crutches, cane, wheelchairs, walkers, the use of special transportation or the assistance of another person to leave their place of residence. There is a normal inability to leave the home and doing so requires considerable and taxing effort. Other absences are for medical reasons / religious services and are infrequent or of short duration when for other reasons). If current dressing causes regression in wound condition, may D/C ordered dressing product/s and apply Normal Saline Moist Dressing daily until next [place] / Other MD appointment. [place] of regression in wound condition at [phone]. Please direct any NON-WOUND related issues/requests for orders to patient's Primary Care Physician 1. I would recommend that we continue with the current wound care measures utilizing the silver alginate dressing I do think that is been beneficial up to this point I see no reason to make a switch at this time. 2. I am also can recommend  appropriate offloading be continued they have been doing a good job with this. 3. I am also going to suggest that we continue to have home health come out apparently her nurse will be changing but at the same time the home health company is not discontinuing any service for her. We will see patient back for reevaluation in 1 week here in the clinic. If anything worsens or changes patient will contact our office for additional recommendations. QUANIYA, DAMAS ([id number]) Electronic Signature(s) Signed: 04/08/2019 1:52:52 PM By: Worthy Keeler PA-C Entered By: Worthy Keeler on 04/08/2019 13:52:52 [place], [name] ([id number]) -------------------------------------------------------------------------------- SuperBill Details Patient Name: [name] Date of Service: 04/08/2019 Medical Record Number: [id number] Patient Account Number: [id number] Date of Birth/Sex: December 16, 1950 (68 y.o. 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 L97.111 Non-pressure chronic ulcer of right thigh limited to breakdown of skin F03.90 Unspecified dementia without behavioral disturbance Z94.0 Kidney transplant status I10 Essential (primary)  hypertension Facility Procedures CPT4 Code: 24825003 Description: 70488 - WOUND CARE VISIT-LEV 3 EST PT Modifier: Quantity: 1 Physician Procedures CPT4 Code: 8916945 Description: 03888 - WC PHYS LEVEL 3 - EST PT Modifier: Quantity: 1 CPT4 Code: Description: ICD-10 Diagnosis Description L24.89 Irritant contact dermatitis due to other agents L98.411 Non-pressure chronic ulcer  of buttock limited to breakdown of skin L97.121 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

## 2019-04-15 ENCOUNTER — Emergency Department
Admission: EM | Admit: 2019-04-15 | Discharge: 2019-04-16 | Disposition: A | Discharge status: Home / Self Care | Payer: Medicare Other | Attending: Emergency Medicine | Admitting: Emergency Medicine

## 2019-04-15 ENCOUNTER — Encounter: Payer: Medicare Other | Attending: Physician Assistant | Admitting: Physician Assistant

## 2019-04-15 ENCOUNTER — Other Ambulatory Visit: Payer: Self-pay

## 2019-04-15 ENCOUNTER — Encounter: Payer: Self-pay | Admitting: Emergency Medicine

## 2019-04-15 DIAGNOSIS — Z8542 Personal history of malignant neoplasm of other parts of uterus: Secondary | ICD-10-CM | POA: Diagnosis not present

## 2019-04-15 DIAGNOSIS — I10 Essential (primary) hypertension: Secondary | ICD-10-CM | POA: Insufficient documentation

## 2019-04-15 DIAGNOSIS — L97121 Non-pressure chronic ulcer of left thigh limited to breakdown of skin: Secondary | ICD-10-CM | POA: Insufficient documentation

## 2019-04-15 DIAGNOSIS — N184 Chronic kidney disease, stage 4 (severe): Secondary | ICD-10-CM | POA: Insufficient documentation

## 2019-04-15 DIAGNOSIS — F0390 Unspecified dementia without behavioral disturbance: Secondary | ICD-10-CM | POA: Insufficient documentation

## 2019-04-15 DIAGNOSIS — Z91040 Latex allergy status: Secondary | ICD-10-CM | POA: Insufficient documentation

## 2019-04-15 DIAGNOSIS — L97111 Non-pressure chronic ulcer of right thigh limited to breakdown of skin: Secondary | ICD-10-CM | POA: Insufficient documentation

## 2019-04-15 DIAGNOSIS — L2489 Irritant contact dermatitis due to other agents: Secondary | ICD-10-CM | POA: Insufficient documentation

## 2019-04-15 DIAGNOSIS — I129 Hypertensive chronic kidney disease with stage 1 through stage 4 chronic kidney disease, or unspecified chronic kidney disease: Secondary | ICD-10-CM | POA: Diagnosis not present

## 2019-04-15 DIAGNOSIS — Z85528 Personal history of other malignant neoplasm of kidney: Secondary | ICD-10-CM | POA: Insufficient documentation

## 2019-04-15 DIAGNOSIS — Z87891 Personal history of nicotine dependence: Secondary | ICD-10-CM | POA: Diagnosis not present

## 2019-04-15 DIAGNOSIS — L98411 Non-pressure chronic ulcer of buttock limited to breakdown of skin: Secondary | ICD-10-CM | POA: Insufficient documentation

## 2019-04-15 DIAGNOSIS — Z79899 Other long term (current) drug therapy: Secondary | ICD-10-CM | POA: Diagnosis not present

## 2019-04-15 DIAGNOSIS — Z940 Kidney transplant status: Secondary | ICD-10-CM | POA: Insufficient documentation

## 2019-04-15 DIAGNOSIS — R799 Abnormal finding of blood chemistry, unspecified: Secondary | ICD-10-CM | POA: Diagnosis present

## 2019-04-15 DIAGNOSIS — N189 Chronic kidney disease, unspecified: Secondary | ICD-10-CM

## 2019-04-15 LAB — BASIC METABOLIC PANEL
Anion gap: 9 (ref 5–15)
BUN: 25 mg/dL — ABNORMAL HIGH (ref 8–23)
CO2: 20 mmol/L — ABNORMAL LOW (ref 22–32)
Calcium: 8.9 mg/dL (ref 8.9–10.3)
Chloride: 110 mmol/L (ref 98–111)
Creatinine, Ser: 3.1 mg/dL — ABNORMAL HIGH (ref 0.44–1.00)
GFR calc Af Amer: 17 mL/min — ABNORMAL LOW (ref >60)
GFR calc non Af Amer: 15 mL/min — ABNORMAL LOW (ref >60)
Glucose, Bld: 106 mg/dL — ABNORMAL HIGH (ref 70–99)
Potassium: 5.5 mmol/L — ABNORMAL HIGH (ref 3.5–5.1)
Sodium: 139 mmol/L (ref 135–145)

## 2019-04-15 LAB — CBC WITH DIFFERENTIAL/PLATELET
Abs Immature Granulocytes: 0.01 10*3/uL (ref 0.00–0.07)
Basophils Absolute: 0.1 10*3/uL (ref 0.0–0.1)
Basophils Relative: 1 %
Eosinophils Absolute: 0.2 10*3/uL (ref 0.0–0.5)
Eosinophils Relative: 4 %
HCT: 26.4 % — ABNORMAL LOW (ref 36.0–46.0)
Hemoglobin: 7.5 g/dL — ABNORMAL LOW (ref 12.0–15.0)
Immature Granulocytes: 0 %
Lymphocytes Relative: 24 %
Lymphs Abs: 1.2 10*3/uL (ref 0.7–4.0)
MCH: 23 pg — ABNORMAL LOW (ref 26.0–34.0)
MCHC: 28.4 g/dL — ABNORMAL LOW (ref 30.0–36.0)
MCV: 81 fL (ref 80.0–100.0)
Monocytes Absolute: 0.5 10*3/uL (ref 0.1–1.0)
Monocytes Relative: 11 %
Neutro Abs: 3 10*3/uL (ref 1.7–7.7)
Neutrophils Relative %: 60 %
Platelets: 258 10*3/uL (ref 150–400)
RBC: 3.26 MIL/uL — ABNORMAL LOW (ref 3.87–5.11)
RDW: 15.9 % — ABNORMAL HIGH (ref 11.5–15.5)
WBC: 4.9 10*3/uL (ref 4.0–10.5)
nRBC: 0 % (ref 0.0–0.2)

## 2019-04-15 NOTE — ED Triage Notes (Signed)
Pt arrives via [facility] from home, reports pt doctor called her today and requested she go to [facility] ED for elevated creatinine, pt left [facility] ED after a few hours prior to being treated. [facility] informed pt that her potassium was elevated as well.  Pt complaining of cramps and discomfort throughout body.Pt has hx of dementia.

## 2019-04-15 NOTE — ED Notes (Signed)
This RN spoke with pt daughter, [name] about pt discharge and plan of care, daughter states understanding and denies any further questions. Pt daughter states that she will be arriving to pick up pt in about 30 minutes.

## 2019-04-15 NOTE — ED Notes (Signed)
Pt is resting in bed and given warm blanket. Pt appears to be comfortable and denies any pain at this time. Respirations are equal and unlabored, no signs of acute distress.

## 2019-04-15 NOTE — ED Provider Notes (Signed)
[facility] Emergency Department Provider Note  ____________________________________________   First MD Initiated Contact with Patient 04/15/19 2303     (approximate)  I have reviewed the triage vital signs and the nursing notes.   HISTORY  Chief Complaint Abnormal Lab  Level 5 caveat:  history/ROS may be limited by the patient's documented chronic dementia.  HPI [name] is a 69 y.o. female with medical history as listed below which notably includes a prior history of renal transplant who presents tonight by private vehicle for evaluation of abnormal labs.  Reportedly she was called by her doctor's office and she was told to go to [facility] emergency department for an elevated creatinine.  Reportedly the patient left after a few hours before being treated in the ED.  She then came [facility].  She says that she believes that she was called and told to come into the emergency department but she cannot remember who called her.  Her daughter frequently helps her with her appointments but she says she brought herself tonight.  She gave me permission to speak with the daughter with any questions or concerns.  She currently says that she has no pain and no shortness of breath.  She says that she feels fine with no nausea, vomiting, no abdominal pain.  She came in only because of the call from the clinic that told her she needed to come to the emergency department.  She says she has been taking her medications and that her daughter helps her with this.  She says she has been eating and drinking.        Past Medical History:  Diagnosis Date  . Anemia   . C. difficile colitis   . Cancer of kidney ([place])   . Dementia ([place])   . GERD (gastroesophageal reflux disease)   . Hypertension   . Renal disorder   . Uterine cancer [facility])     Patient Active Problem List   Diagnosis Date Noted  . Dehydration   . Acute-on-chronic kidney injury ([place]) 02/26/2019  . FTT  (failure to thrive) in adult 02/26/2019  . Pressure injury of skin 02/04/2019  . AKI (acute kidney injury) ([place]) 02/01/2019  . Shaking 01/31/2019  . Hyperkalemia 01/31/2019  . Acute renal failure superimposed on stage 4 chronic kidney disease ([place]) 01/31/2019  . HLD (hyperlipidemia) 01/31/2019  . Acute lower UTI 11/07/2018  . C. difficile colitis 11/07/2018  . Acute encephalopathy 09/22/2018  . Renal failure (ARF), acute on chronic ([place]) 08/12/2018  . Confusion 03/23/2018  . Hypokalemia 06/01/2017  . Sepsis ([place]) 06/29/2016  . UTI (urinary tract infection) 06/29/2016  . GERD (gastroesophageal reflux disease) 06/29/2016  . Dementia ([place]) 06/29/2016  . 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 Essential hypertension 07/06/2014  . H/O kidney transplant 07/06/2014    Past Surgical History:  Procedure Laterality Date  . ABDOMINAL HYSTERECTOMY    . AV FISTULA PLACEMENT    . BREAST BIOPSY     Patient is unsure but she thinks she remember a Bx  . BREAST BIOPSY Right 2017   benign  . KIDNEY TRANSPLANT    . NEPHRECTOMY TRANSPLANTED ORGAN      Prior to Admission medications   Medication Sig Start Date End Date Taking? Authorizing Provider  amLODipine (NORVASC) 5 MG tablet Take 1 tablet (5 mg total) by mouth daily. New blood pressure medication. 03/06/19 04/05/19  Enzo Bi, MD  atorvastatin (LIPITOR) 40 MG tablet Take 40 mg by mouth  at bedtime.     [provider]  calcitRIOL (ROCALTROL) 0.25 MCG capsule Take 0.25 mcg by mouth daily.    [provider]  calcium elemental as carbonate (TUMS ULTRA 1000) 400 MG chewable tablet Chew 1,000 mg by mouth 2 (two) times daily.     [provider]  cholecalciferol (VITAMIN D3) 25 MCG (1000 UT) tablet Take 1,000 Units by mouth daily.    [provider]  citalopram (CELEXA) 20 MG tablet Take 20 mg by mouth daily.    [provider]  donepezil (ARICEPT) 5 MG tablet Take 5 mg by mouth at bedtime.      [provider]  feeding supplement, ENSURE ENLIVE, (ENSURE ENLIVE) LIQD Take 237 mLs by mouth 2 (two) times daily between meals. 09/27/18   Saundra Shelling, MD  folic acid (FOLVITE) 1 MG tablet Take 1 mg by mouth 2 (two) times daily.     [provider]  furosemide (LASIX) 20 MG tablet Hold until follow up with kidney doctor. 03/06/19   Enzo Bi, MD  metoprolol tartrate (LOPRESSOR) 25 MG tablet Take 25 mg by mouth 2 (two) times daily.    [provider]  mycophenolate (MYFORTIC) 180 MG EC tablet Take 180 mg by mouth 2 (two) times daily.    [provider]  potassium chloride (KLOR-CON) 10 MEQ tablet Hold until follow up with kidney doctor 03/06/19   Enzo Bi, MD  Saccharomyces boulardii (PROBIOTIC) 250 MG CAPS Take 1 capsule by mouth 2 (two) times daily. 04/06/19   Merlyn Lot, MD  sirolimus (RAPAMUNE) 1 MG tablet Take 3 mg by mouth daily.     [provider]  traZODone (DESYREL) 50 MG tablet Take 50 mg by mouth at bedtime.  10/11/18 10/11/19  [provider]    Allergies Ace inhibitors, Latex, Lactose intolerance (gi), and Lisinopril  Family History  Problem Relation Age of Onset  . Stroke Mother   . Alcohol abuse Father   . Breast cancer Neg Hx     Social History Social History   Tobacco Use  . Smoking status: Former Research scientist (life sciences)  . Smokeless tobacco: Never Used  Substance Use Topics  . Alcohol use: No  . Drug use: No    Review of Systems Level 5 caveat:  history/ROS may be limited by the patient's documented chronic dementia.   Constitutional: No fever/chills Eyes: No visual changes. ENT: No sore throat. Cardiovascular: Denies chest pain. Respiratory: Denies shortness of breath. Gastrointestinal: No abdominal pain.  No nausea, no vomiting.  No diarrhea.  No constipation. Genitourinary: Negative for dysuria. Musculoskeletal: Negative for neck pain.  Negative for back pain. Integumentary: Negative for rash. Neurological:  Negative for headaches, focal weakness or numbness.   ____________________________________________   PHYSICAL EXAM:  VITAL SIGNS: ED Triage Vitals  Enc Vitals Group     BP 04/15/19 2229 (!) 175/91     Pulse Rate 04/15/19 2229 74     Resp 04/15/19 2229 16     Temp 04/15/19 2229 97.8 F (36.6 C)     Temp Source 04/15/19 2229 Oral     SpO2 04/15/19 2228 100 %     Weight 04/15/19 2230 86.2 kg (190 lb)     Height 04/15/19 2230 1.524 m (5')     Head Circumference --      Peak Flow --      Pain Score --      Pain Loc --      Pain Edu? --  Excl. in [place]? --     Constitutional: Alert and oriented.  Some memory difficulties regarding the events of today but otherwise she is sharp and able to give names, numbers, locations, etc. Eyes: Conjunctivae are normal.  Head: Atraumatic. Nose: No congestion/rhinnorhea. Mouth/Throat: Patient is wearing a mask. Neck: No stridor.  No meningeal signs.   Cardiovascular: Normal rate, regular rhythm. Good peripheral circulation. Grossly normal heart sounds. Respiratory: Normal respiratory effort.  No retractions. Gastrointestinal: Soft and nontender. No distention.  Musculoskeletal: No lower extremity tenderness nor edema. No gross deformities of extremities. Neurologic:  Normal speech and language. No gross focal neurologic deficits are appreciated.  Skin:  Skin is warm, dry and intact. Psychiatric: Mood and affect are normal. Speech and behavior are normal.  ____________________________________________   LABS (all labs ordered are listed, but only abnormal results are displayed)  Labs Reviewed  BASIC METABOLIC PANEL - Abnormal; Notable for the following components:      Result Value   Potassium 5.5 (*)    CO2 20 (*)    Glucose, Bld 106 (*)    BUN 25 (*)    Creatinine, Ser 3.10 (*)    GFR calc non Af Amer 15 (*)    GFR calc Af Amer 17 (*)    All other components within normal limits  CBC WITH DIFFERENTIAL/PLATELET - Abnormal; Notable  for the following components:   RBC 3.26 (*)    Hemoglobin 7.5 (*)    HCT 26.4 (*)    MCH 23.0 (*)    MCHC 28.4 (*)    RDW 15.9 (*)    All other components within normal limits   ____________________________________________  EKG  ED ECG REPORT I, Hinda Kehr, the attending physician, personally viewed and interpreted this ECG.  Date: 04/15/2019 EKG Time: 23: 02 Rate: 56 Rhythm: Sinus bradycardia QRS Axis: normal Intervals: normal ST/T Wave abnormalities: Non-specific ST segment / T-wave changes, but no clear evidence of acute ischemia. Narrative Interpretation: no definitive evidence of acute ischemia; does not meet STEMI criteria.   ____________________________________________  RADIOLOGY I, Hinda Kehr, personally viewed and evaluated these images (plain radiographs) as part of my medical decision making, as well as reviewing the written report by the radiologist.  ED MD interpretation: No indication for emergent imaging  Official radiology report(s): No results found.  ____________________________________________   PROCEDURES   Procedure(s) performed (including Critical Care):  Procedures   ____________________________________________   INITIAL IMPRESSION / MDM / ASSESSMENT AND PLAN / ED COURSE  As part of my medical decision making, I reviewed the following data within the electronic MEDICAL RECORD NUMBER History obtained from family, Nursing notes reviewed and incorporated, Labs reviewed , EKG interpreted , Old chart reviewed, Discussed with nephrologist (Dr. Candiss Norse) and reviewed Notes from prior ED visits   Differential diagnosis includes, but is not limited to, volume depletion, transplant rejection, acute infection, acute on chronic kidney injury or failure, metabolic or electrolyte abnormality.  The patient has no complaints or concerns at this time he came in because of a phone call from her clinic.  I reviewed her labs tonight, which are notable for  potassium of 5.5 and a creatinine of 3.1.  Fortunately these results are almost unchanged from her nephrology visit a week ago where she had a potassium of 5.4 and a creatinine of 3.5.  Her CBC is generally reassuring and vital signs are stable.  She has no complaints or concerns.  I will call and speak with the nephrologist on-call who  happens to be Dr. Candiss Norse, the nephrologist who saw her in clinic last week.  I will defer additional treatment or inpatient versus outpatient management to his recommendations as there does not appear to be an emergent medical condition at this time.      Clinical Course as of Apr 14 2344  Mon Apr 15, 2019  2331 I spoke by phone with Dr. Candiss Norse with nephrology.  I went over the lab results tonight and compared them to a week ago when he saw her in clinic and he agreed that since her labs have not changed substantially in a week and she is having no complaints or concerns at this time with stable vital signs, there is no indication for further intervention.  He recommended that I encouraged her to drink plenty of fluids and follow-up with him in clinic.  He was comfortable with the potassium of 5.5 given that it was 5.4 last week and he did not feel there is need for emergent intervention.  I will contact her daughter to let her know about the plan as well.   [CF]  2337 I spoke with the patient's daughter, [name], and she was comfortable with the plan for discharge and outpatient follow-up.   [CF]    Clinical Course User Index [CF] Hinda Kehr, MD     ____________________________________________  FINAL CLINICAL IMPRESSION(S) / ED DIAGNOSES  Final diagnoses:  Chronic kidney disease, unspecified CKD stage     MEDICATIONS GIVEN DURING THIS VISIT:  Medications - No data to display   ED Discharge Orders    None      *Please note:  Torry Adamczak was evaluated in Emergency Department on 04/15/2019 for the symptoms described in the history of present  illness. She was evaluated in the context of the global COVID-19 pandemic, which necessitated consideration that the patient might be at risk for infection with the SARS-CoV-2 virus that causes COVID-19. Institutional protocols and algorithms that pertain to the evaluation of patients at risk for COVID-19 are in a state of rapid change based on information released by regulatory bodies including the CDC and federal and state organizations. These policies and algorithms were followed during the patient's care in the ED.  Some ED evaluations and interventions may be delayed as a result of limited staffing during the pandemic.*  Note:  This document was prepared using Dragon voice recognition software and may include unintentional dictation errors.   Hinda Kehr, MD 04/15/19 [phone]

## 2019-04-15 NOTE — Discharge Instructions (Signed)
As we discussed, although your lab work is abnormal, it has not changed substantially since your visit to Dr. Candiss Norse, the kidney doctor, 1 week ago.  I spoke with him by phone and he did not feel that anything needed to be changed at this time and that you do not need to stay in the hospital.  He encourage you to drink plenty of fluids and stay hydrated and continue taking all of your regular medications.  Please call his office in the morning to schedule a follow-up appointment.     Return to the emergency department if you develop new or worsening symptoms that concern you.

## 2019-04-15 NOTE — Progress Notes (Addendum)
JUSTYNA, TIMONEY ([id number]) Visit Report for 04/15/2019 Chief Complaint Document Details Patient Name: LEEYA, RUSCONI Date of Service: 04/15/2019 12:45 PM Medical Record Number: [id number] Patient Account Number: [id number] Date of Birth/Sex: 09-28-1950 (69 y.o. F) Treating RN: Primary Care Provider: Harrel Lemon Other Clinician: Referring Provider: Harrel Lemon Treating Provider/Extender: Melburn Hake, Dajanique Robley Weeks in Treatment: 20 Information Obtained from: Patient Chief Complaint Gluteal and bilateral upper leg ulcers Electronic Signature(s) Signed: 04/15/2019 1:05:11 PM By: Worthy Keeler PA-C Entered By: Worthy Keeler on 04/15/2019 13:05:10 [place] ([id number]) -------------------------------------------------------------------------------- HPI Details Patient Name: [name] Date of Service: 04/15/2019 12:45 PM Medical Record Number: [id number] Patient Account Number: [id number] Date of Birth/Sex: [birth year] (68 y.o. F) Treating RN: Primary Care Provider: Harrel Lemon Other Clinician: Referring Provider: Harrel Lemon Treating Provider/Extender: Melburn Hake, Tuyen Uncapher Weeks in Treatment: 20 History of Present Illness HPI Description: 11/22/2018 patient presents today for initial evaluation in our clinic concerning issues that she has been having with a wound in the left gluteal fold. This has been present since August and was noted initially when she was in the hospital. Fortunately there is no signs of active infection at this time. No fever chills noted. Up to this point DuoDERM has been utilized although I am not sure that is the best treatment option for her based on what I am seeing currently. This actually started as a result of moisture associated skin damage secondary to a C. difficile infection. The patient does have dementia. She is also status post having had a kidney transplant and does have hypertension as well. She does spend a lot of her time during the day  sitting which is probably a part of why this is having trouble healing although it is not really a pressure ulcer that may be contributing to the lack of significant and rapid improvement. She still is having diarrhea as well and is still on treatment for the C. difficile infection she is on Dificid. They have been using barrier cream in the gluteal region to try to help protect the area where the dressing was not. 11/29/2018 upon evaluation today patient does not appear to be doing significantly better at this time. She still had some somewhat softer stools but not really as much diarrhea as what she had when the C. difficile infection was not full cycle. Overall things seem to be doing better in that regard although she does have 2 new areas on evaluation today. There is no signs of active infection. No fevers, chills, nausea, vomiting, or diarrhea. 12/06/2018 on evaluation today patient actually appears to be doing better with regard to the wounds in the gluteal region in general. Overall the biggest issue has been that apparently one of the wounds was missed by the home health nurse and not noted to be open and its been getting stuck to her brief. Nonetheless other than that and even with that considered she still seems to actually be doing quite well as far as I am seeing today. 12/20/2018 on evaluation today patient appears to be doing very well in regard to her wounds. Fortunately there is no sign of active infection at this time which is great news. She has been tolerating the dressing changes without complication although [facility] is no longer coming out they just discontinued the patient's treatment all of a sudden standing that they were complete as far as what they could do. Nonetheless unfortunately the patient still has wounds open and has not had any dressings to  put on the area they have been doing the best they could until they came in here today. There is no evidence of  infection and things seem to be doing quite well. 01/03/2019 on evaluation today patient appears to be doing okay in some regards regarding her wounds. Specifically the main wound on the left gluteal fold in the right gluteal fold seems to be doing much better. The right gluteal fold in fact is pretty much healed. On the left this still has a little bit deeper area open at this point I think that still needs a bandage. Unfortunately she got different bandages than the silicone, Allevyn, bandages that we had recommended and ordered for her. For that reason she unfortunately does have some additional breakdown around where the bandages have been pulling at this time. This has caused several areas of skin breakdown unfortunately. Fortunately there is no signs of active infection at this time. No fevers, chills, nausea, vomiting, or diarrhea. 01/17/2019 on evaluation today patient appears to be having a lot of issues with skin tearing due to adhesive's from the border foam dressing that was sent from the medical supplier. With that being said we specifically ordered Allevyn dressings I am not sure why they sent the other and less it has a insurance issue with not actually covering the dressings that they feel like that is better for her. With that being said there does not appear to be any signs of active infection at this point which is good news. Nonetheless she is still having pain. She is seen with a caregiver here today. 01/24/2019 on evaluation today patient appears to be doing well with regard to her wounds. In fact she is significantly improved as compared to last week's evaluation. Having the right dressings, Allevyn, has been of utmost importance for her this does not seem to damage her skin which is excellent news. Overall the patient also does not seem to be having as much pain. She again is seen with her caregiver during the office visit today and her caregiver is very pleased with how things  seem to be progressing with the proper treatments. 03/18/2019 patient is currently being seen after unfortunately having been in the hospital a couple of times since we last saw her for various reasons unrelated to her wounds. Unfortunately she does still have some of the wounds present at this time. There is no signs of active infection which is good news at least. Home health has been coming out to see her it sounds like they have been using silver alginate dressings on the area. She mainly has regions open on her upper leg/thigh region bilaterally there was an area in the gluteal region but this appears to likely be closed today. 03/25/2019 upon evaluation today patient appears to be doing very well with regard to her wounds. She has been tolerating the dressing changes and fortunately there is no signs of active infection at this time. No fevers, chills, nausea, vomiting, or diarrhea. The wounds on the left medial upper leg as well as the right posterior upper leg. 04/01/2019 upon evaluation today patient appears to be doing well with regard to her wounds. I feel like she is making good progress fracture seeing some improvement in his overall quality today. Hopefully this will continue to progress accordingly. Fortunately there is no signs of active infection at this time. 04/08/2019 upon evaluation today patient actually appears to be doing better with regard to her wounds in general. She is having  much less pain and in fact did not really seem to experience any pain when I was cleaning the wounds today. This is great news and a big change even from last week. 04/15/2019 upon evaluation today patient actually appears to be doing well and in fact has 1 healed wound and 1 remaining that was the larger of the 2. ANAIH, BRANDER ([id number]) Unfortunately she has a gluteal ulcer which is new and this is on the left gluteal region. Fortunately there is no signs of active infection at this time. No fevers,  chills, nausea, vomiting, or diarrhea. Electronic Signature(s) Signed: 04/15/2019 1:21:45 PM By: Worthy Keeler PA-C Entered By: Worthy Keeler on 04/15/2019 13:21:45 TIYAH, ZELENAK ([id number]) -------------------------------------------------------------------------------- Physical Exam Details Patient Name: [name] Date of Service: 04/15/2019 12:45 PM Medical Record Number: [id number] Patient Account Number: [id number] Date of Birth/Sex: [birth year] (68 y.o. F) Treating RN: Primary Care Provider: Harrel Lemon Other Clinician: Referring Provider: Harrel Lemon Treating Provider/Extender: STONE III, Netanya Yazdani Weeks in Treatment: 20 Constitutional Obese and well-hydrated in no acute distress. Respiratory normal breathing without difficulty. Psychiatric this patient is able to make decisions and demonstrates good insight into disease process. Alert and Oriented x 3. pleasant and cooperative. Notes Upon inspection no sharp debridement was needed in the patient's wound locations today which is great news overall she seems to be doing quite well as far as improvement is concerned other than the fact that she does have one new wound that is unfortunate. Other than that where she has some pain near the original larger ulcer on the leg we have been managing is not nearly as bad today no sharp debridement necessary. Electronic Signature(s) Signed: 04/15/2019 1:22:15 PM By: Worthy Keeler PA-C Entered By: Worthy Keeler on 04/15/2019 13:22:14 DEZIAH, RENWICK ([id number]) -------------------------------------------------------------------------------- Physician Orders Details Patient Name: [name] Date of Service: 04/15/2019 12:45 PM Medical Record Number: [id number] Patient Account Number: [id number] Date of Birth/Sex: January 23, 1951 (68 y.o. 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 - in office o Cleanse wound with mild soap and water Wound #7 Left Gluteus o Clean wound with Normal Saline. - in office o Cleanse wound with mild soap and water Skin Barriers/Peri-Wound Care Wound #4 Left,Medial Upper Leg o Skin Prep - periwound Wound #7 Left Gluteus o Skin Prep - periwound Primary Wound Dressing Wound #4 Left,Medial Upper Leg o Silver Alginate Wound #7 Left Gluteus o Silver Alginate Secondary Dressing Wound #4 Left,Medial Upper Leg o Boardered Foam Dressing Wound #7 Left Gluteus o Boardered Foam Dressing Dressing Change Frequency Wound #4 Left,Medial Upper Leg o Other: - twice weekly Wound #7 Left Gluteus o Other: - twice weekly Follow-up Appointments Wound #4 Left,Medial Upper Leg o Return Appointment in 1 week. CHAKITA, MCGRAW ([id number]) Wound #7 Left Gluteus o Return Appointment in 1 week. Off-Loading Wound #4 Left,Medial Upper Leg o Turn and reposition every 2 hours o Other: - memory foam cushion  while sitting, rolled towel between legs when sitting, if possible. Wound #7 Left Gluteus o Turn and reposition every 2 hours o Other: - memory foam cushion while sitting, rolled towel between legs when sitting, if possible. Home Health Wound #4 Left,Medial Upper Leg o Continue Home Health  Visits - [place] Nurse may visit PRN to address patientos wound care needs. o FACE TO FACE ENCOUNTER: MEDICARE and MEDICAID PATIENTS: I certify that this patient is under my care and that I had a face-to- face encounter that meets the physician face-to-face encounter requirements with this patient on this date. The encounter with the patient was in whole or in part for the following MEDICAL CONDITION: (primary reason for [place]) MEDICAL NECESSITY: I certify, that based on my findings, NURSING services are a medically necessary home health service. HOME BOUND STATUS: I certify that my clinical findings support that this patient is homebound (i.e., Due to illness or injury, pt requires aid of supportive devices such as crutches, cane, wheelchairs, walkers, the use of special transportation or the assistance of another person to leave their place of residence. There is a normal inability to leave the home and doing so requires considerable and taxing effort. Other absences are for medical reasons / religious services and are infrequent or of short duration when for other reasons). o If current dressing causes regression in wound condition, may D/C ordered dressing product/s and apply Normal Saline Moist Dressing daily until next [place] / Other MD appointment. [place] of regression in wound condition at [phone]. o Please direct any NON-WOUND related issues/requests for orders to patient's Primary Care Physician Wound #7 Left [place] Visits - [place] Nurse may visit PRN to address patientos wound care needs. o FACE TO FACE ENCOUNTER: MEDICARE and MEDICAID PATIENTS: I certify that this patient is under my care and that I had a face-to- face encounter that meets the physician face-to-face encounter requirements with this patient on this date. The encounter with the patient was in whole  or in part for the following MEDICAL CONDITION: (primary reason for [place]) MEDICAL NECESSITY: I certify, that based on my findings, NURSING services are a medically necessary home health service. HOME BOUND STATUS: I certify that my clinical findings support that this patient is homebound (i.e., Due to illness or injury, pt requires aid of supportive devices such as crutches, cane, wheelchairs, walkers, the use of special transportation or the assistance of another person to leave their place of residence. There is a normal inability to leave the home and doing so requires considerable and taxing effort. Other absences are for medical reasons / religious services and are infrequent or of short duration when for other reasons). o If current dressing causes regression in wound condition, may D/C ordered dressing product/s and apply Normal Saline Moist Dressing daily until next [place] / Other MD appointment. [place] of regression in wound condition at [phone]. o Please direct any NON-WOUND related issues/requests for orders to patient's Primary Care Physician Electronic Signature(s) Signed: 04/15/2019 4:36:22 PM By: Army Melia Signed: 04/15/2019 4:51:53 PM By: Worthy Keeler PA-C Entered By: Army Melia on 04/15/2019 13:10:48 MCKYNLEIGH, MUSSELL ([id number]) -------------------------------------------------------------------------------- Problem List Details Patient Name: [name] Date of Service: 04/15/2019 12:45 PM Medical Record Number: [id number] Patient Account Number: [id number] Date of Birth/Sex: [birth year] (68 y.o. 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 03/18/2019 No Yes L97.111 Non-pressure chronic ulcer of right thigh limited to breakdown of skin 03/18/2019 No Yes F03.90 Unspecified dementia without behavioral disturbance 11/22/2018 No Yes Z94.0 Kidney transplant status 11/22/2018 No Yes I10 Essential (primary) hypertension 11/22/2018 No Yes Inactive Problems Resolved Problems Electronic Signature(s) Signed: 04/15/2019 1:05:03 PM By: Worthy Keeler PA-C Entered By: Worthy Keeler on 04/15/2019 13:05:02 [place] ([id number]) -------------------------------------------------------------------------------- Progress Note Details Patient Name: [name] Date of Service: 04/15/2019 12:45 PM Medical Record Number: [id number] Patient Account Number: [id number] Date of Birth/Sex: 03-21-1950 (68 y.o. F) Treating RN: Primary Care Provider: Harrel Lemon Other Clinician: Referring Provider: Harrel Lemon Treating Provider/Extender: Melburn Hake, Japneet Staggs Weeks in Treatment: 20 Subjective Chief Complaint Information obtained from Patient Gluteal and bilateral upper leg ulcers History of Present Illness (HPI) 11/22/2018 patient presents today for initial evaluation in our clinic concerning issues that she has been having with a wound in the left gluteal fold. This has been present since August and was noted initially when she was in the hospital. Fortunately there is no signs of active infection at this time. No fever chills noted. Up to this point DuoDERM has been utilized although I am not sure that is the best treatment option for her based on what I am seeing currently. This actually started as a result of moisture associated skin damage secondary to a C. difficile infection. The patient does have dementia. She is also status post having had a kidney transplant  and does have hypertension as well. She does spend a lot of her time during the day sitting which is probably a part of why this is having trouble healing although it is not really a pressure ulcer that may be contributing to the lack of significant and rapid improvement. She still is having diarrhea as well and is still on treatment for the C. difficile infection she is on Dificid. They have been using barrier cream in the gluteal region to try to help protect the area where the dressing was not. 11/29/2018 upon evaluation today patient does not appear to be doing significantly better at this time. She still had some somewhat softer stools but not really as much diarrhea as what she had when the C. difficile infection was not full cycle. Overall things seem to be doing better in that regard although she does have 2 new areas on evaluation today. There is no signs of active infection. No fevers, chills, nausea, vomiting, or diarrhea. 12/06/2018 on evaluation today patient actually appears to be doing better with regard to the wounds in the gluteal region in general. Overall the biggest issue has been that apparently one of the wounds was missed by the home health nurse and not noted to be open and its been getting stuck to her brief. Nonetheless other than that and even with that considered she still seems to actually be doing quite well as far as I am seeing today. 12/20/2018 on evaluation today patient appears to be doing very well in regard to her wounds. Fortunately there is no sign of active infection at this time which is great news. She has been tolerating the dressing changes  without complication although [facility] is no longer coming out they just discontinued the patient's treatment all of a sudden standing that they were complete as far as what they could do. Nonetheless unfortunately the patient still has wounds open and has not had any dressings to put on the area they have been  doing the best they could until they came in here today. There is no evidence of infection and things seem to be doing quite well. 01/03/2019 on evaluation today patient appears to be doing okay in some regards regarding her wounds. Specifically the main wound on the left gluteal fold in the right gluteal fold seems to be doing much better. The right gluteal fold in fact is pretty much healed. On the left this still has a little bit deeper area open at this point I think that still needs a bandage. Unfortunately she got different bandages than the silicone, Allevyn, bandages that we had recommended and ordered for her. For that reason she unfortunately does have some additional breakdown around where the bandages have been pulling at this time. This has caused several areas of skin breakdown unfortunately. Fortunately there is no signs of active infection at this time. No fevers, chills, nausea, vomiting, or diarrhea. 01/17/2019 on evaluation today patient appears to be having a lot of issues with skin tearing due to adhesive's from the border foam dressing that was sent from the medical supplier. With that being said we specifically ordered Allevyn dressings I am not sure why they sent the other and less it has a insurance issue with not actually covering the dressings that they feel like that is better for her. With that being said there does not appear to be any signs of active infection at this point which is good news. Nonetheless she is still having pain. She is seen with a caregiver here today. 01/24/2019 on evaluation today patient appears to be doing well with regard to her wounds. In fact she is significantly improved as compared to last week's evaluation. Having the right dressings, Allevyn, has been of utmost importance for her this does not seem to damage her skin which is excellent news. Overall the patient also does not seem to be having as much pain. She again is seen with her caregiver  during the office visit today and her caregiver is very pleased with how things seem to be progressing with the proper treatments. 03/18/2019 patient is currently being seen after unfortunately having been in the hospital a couple of times since we last saw her for various reasons unrelated to her wounds. Unfortunately she does still have some of the wounds present at this time. There is no signs of active infection which is good news at least. Home health has been coming out to see her it sounds like they have been using silver alginate dressings on the area. She mainly has regions open on her upper leg/thigh region bilaterally there was an area in the gluteal region but this appears to likely be closed today. 03/25/2019 upon evaluation today patient appears to be doing very well with regard to her wounds. She has been tolerating the dressing changes and fortunately there is no signs of active infection at this time. No fevers, chills, nausea, vomiting, or diarrhea. The wounds on the left medial upper leg as well as the right posterior upper leg. 04/01/2019 upon evaluation today patient appears to be doing well with regard to her wounds. I feel like she is making good progress  fracture seeing some improvement in his overall quality today. Hopefully this will continue to progress accordingly. Fortunately there is no signs of active infection at this time. ASHANTAE, PANGALLO ([id number]) 04/08/2019 upon evaluation today patient actually appears to be doing better with regard to her wounds in general. She is having much less pain and in fact did not really seem to experience any pain when I was cleaning the wounds today. This is great news and a big change even from last week. 04/15/2019 upon evaluation today patient actually appears to be doing well and in fact has 1 healed wound and 1 remaining that was the larger of the 2. Unfortunately she has a gluteal ulcer which is new and this is on the left gluteal  region. Fortunately there is no signs of active infection at this time. No fevers, chills, nausea, vomiting, or diarrhea. Objective Constitutional Obese and well-hydrated in no acute distress. Vitals Time Taken: 12:55 PM, Height: 59 in, Weight: 193 lbs, BMI: 39, Temperature: 98.5 F, Pulse: 63 bpm, Respiratory Rate: 18 breaths/min, Blood Pressure: 109/60 mmHg. Respiratory normal breathing without difficulty. Psychiatric this patient is able to make decisions and demonstrates good insight into disease process. Alert and Oriented x 3. pleasant and cooperative. General Notes: Upon inspection no sharp debridement was needed in the patient's wound locations today which is great news overall she seems to be doing quite well as far as improvement is concerned other than the fact that she does have one new wound that is unfortunate. Other than that where she has some pain near the original larger ulcer on the leg we have been managing is not nearly as bad today no sharp debridement necessary. Integumentary (Hair, Skin) Wound #4 status is Open. Original cause of wound was Gradually Appeared. The wound is located on the Left,Medial Upper Leg. The wound measures 1.4cm length x 2cm width x 0.1cm depth; 2.199cm^2 area and 0.22cm^3 volume. Wound #6 status is Healed - Epithelialized. Original cause of wound was Gradually Appeared. The wound is located on the Right,Posterior Upper Leg. The wound measures 0cm length x 0cm width x 0cm depth; 0cm^2 area and 0cm^3 volume. Wound #7 status is Open. Original cause of wound was Gradually Appeared. The wound is located on the Left Gluteus. The wound measures 1.3cm length x 1.1cm width x 0.1cm depth; 1.123cm^2 area and 0.112cm^3 volume. There is Fat Layer (Subcutaneous Tissue) Exposed exposed. There is no tunneling or undermining noted. There is a medium amount of serous drainage noted. The wound margin is flat and intact. There is medium (34- 66%) pink granulation  within the wound bed. There is a medium (34-66%) amount of necrotic tissue within the wound bed including Adherent Slough. 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 - in office Cleanse wound with mild soap and water Wound #7 Left Gluteus: Clean wound with Normal Saline. - in office Cleanse wound with mild soap and water Skin Barriers/Peri-Wound Care: Wound #4 Left,Medial Upper Leg: Skin Prep - periwound Wound #7 Left Gluteus: Skin Prep - periwound Primary Wound Dressing: Wound #4 Left,Medial Upper Leg: Silver Alginate Wound #7 Left Gluteus: Silver Alginate Secondary Dressing:  Wound #4 Left,Medial Upper Leg: Boardered Foam Dressing Wound #7 Left Gluteus: Boardered Foam Dressing Dressing Change Frequency: Wound #4 Left,Medial Upper Leg: Other: - twice weekly Wound #7 Left Gluteus: Other: - twice weekly Follow-up Appointments: Wound #4 Left,Medial Upper Leg: Return Appointment in 1 week. Wound #7 Left Gluteus: Return Appointment in 1 week. Off-Loading: Wound #4 Left,Medial Upper Leg: Turn and reposition every 2 hours Other: - memory foam cushion while sitting, rolled towel between legs when sitting, if possible. Wound #7 Left Gluteus: Turn and reposition every 2 hours Other: - memory foam cushion while sitting, rolled towel between legs when sitting, if possible. Home Health: Wound #4 Left,Medial Upper Leg: Continue Home Health Visits - [facility] Nurse may visit PRN to address patient s  wound care needs. FACE TO FACE ENCOUNTER: MEDICARE and MEDICAID PATIENTS: I certify that this patient is under my care and that I had a face-to-face encounter that meets the physician face-to-face encounter requirements with this patient on this date. The encounter with the patient was in whole or in part for the following MEDICAL CONDITION: (primary reason for [place]) MEDICAL NECESSITY: I certify, that based on my findings, NURSING services are a medically necessary home health service. HOME BOUND STATUS: I certify that my clinical findings support that this patient is homebound (i.e., Due to illness or injury, pt requires aid of supportive devices such as crutches, cane, wheelchairs, walkers, the use of special transportation or the assistance of another person to leave their place of residence. There is a normal inability to leave the home and doing so requires considerable and taxing effort. Other absences are for medical reasons / religious services and are infrequent or of short duration when for other reasons). If current dressing causes regression in wound condition, may D/C ordered dressing product/s and apply Normal Saline Moist Dressing daily until next [place] / Other MD appointment. [place] of regression in wound condition at [phone]. Please direct any NON-WOUND related issues/requests for orders to patient's Primary Care Physician Wound #7 Left Gluteus: [place] Nurse may visit PRN to address patient s wound care needs. FACE TO FACE ENCOUNTER: MEDICARE and MEDICAID PATIENTS: I certify that this patient is under my care and that I had a face-to-face encounter that meets the physician face-to-face encounter requirements with this patient on this date. The encounter with the patient was in whole or in part for the following MEDICAL CONDITION: (primary reason for [place]) MEDICAL NECESSITY: I  certify, that based on my findings, NURSING services are a medically necessary home health service. HOME BOUND STATUS: I certify that my clinical findings support that this patient is homebound (i.e., Due to illness or injury, pt requires aid of supportive devices such as crutches, cane, wheelchairs, walkers, the use of special transportation or the assistance of another person to leave their place of residence. There is a normal inability to leave the home and doing so requires considerable and taxing effort. Other absences are for medical reasons / religious services and are infrequent or of short duration when for other reasons). If current dressing causes regression in wound condition, may D/C ordered dressing product/s and apply Normal Saline Moist Dressing daily until next [place] / Other MD appointment. [place] of regression in wound condition at [phone]. Please direct any NON-WOUND related issues/requests for orders to patient's Primary Care Physician [place], SHERRIE ([phone]) 1. I would recommend  currently that we go ahead and initiate a continuation of the current wound care measures with regard to silver alginate I think that still likely the best way to go at this point. The patient is in agreement with the plan. 2. I am also going to suggest that we continue with appropriate offloading I think that is good to be the key to keeping new wounds from showing up. Obviously the more the patient can stay off of this the better. 3. I am in a recommend as well that we need to have smaller dressings not the large sacral over the wound currently there is really not enough room to cover the wound and still not cover part of her anus preventing bowel movements with a large sacral dressing that is really not necessary. If she can have a smaller 1 that would do much more effectively for her. We will see patient back for reevaluation in 1 week here in the  clinic. If anything worsens or changes patient will contact our office for additional recommendations. Electronic Signature(s) Signed: 04/15/2019 1:23:43 PM By: Worthy Keeler PA-C Entered By: Worthy Keeler on 04/15/2019 13:23:42 AFSA, MEANY ([id number]) -------------------------------------------------------------------------------- SuperBill Details Patient Name: [name] Date of Service: 04/15/2019 Medical Record Number: [id number] Patient Account Number: [id number] Date of Birth/Sex: 28-Sep-1950 (68 y.o. 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 F03.90 Unspecified dementia without behavioral disturbance Z94.0 Kidney transplant status I10 Essential (primary) hypertension Facility Procedures CPT4 Code: [id number] Description: 99213 - WOUND CARE VISIT-LEV 3 EST PT Modifier: Quantity: 1 Physician Procedures CPT4 Code: 1115520 Description: 80223 - WC PHYS LEVEL 3 - EST PT Modifier: Quantity: 1 CPT4 Code: Description: ICD-10 Diagnosis Description L24.89 Irritant contact dermatitis due to other agents L98.411 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

## 2019-04-15 NOTE — Progress Notes (Signed)
[name], [name] ([id number]) Visit Report for 04/15/2019 Arrival Information Details Patient Name: Sheri, Navarro Date of Service: 04/15/2019 12:45 PM Medical Record Number: [id number] Patient Account Number: [id number] Date of Birth/Sex: [birth year] (69 y.o. F) Treating RN: Primary Care Murtaza Shell: Harrel Lemon Other Clinician: Referring Delecia Vastine: Harrel Lemon Treating Alyviah Crandle/Extender: Melburn Hake, HOYT Weeks in Treatment: 20 Visit Information History Since Last Visit Added or deleted any medications: No Patient Arrived: Wheel Chair Any new allergies or adverse reactions: No Arrival Time: 12:55 Had a fall or experienced change in No Accompanied By: daughter activities of daily living that may affect Transfer Assistance: Manual risk of falls: Patient Identification Verified: Yes Signs or symptoms of abuse/neglect since last visito No Secondary Verification Process Completed: Yes Hospitalized since last visit: No Patient Requires Transmission-Based Precautions: No Implantable device outside of the clinic excluding No Patient Has Alerts: Yes cellular tissue based products placed in the center Patient Alerts: NOT Diabetic since last visit: Has Dressing in Place as Prescribed: Yes Pain Present Now: No Electronic Signature(s) Signed: 04/15/2019 4:02:24 PM By: Lorine Bears RCP, RRT, CHT Entered By: Lorine Bears on 04/15/2019 12:56:21 [name] ([id number]) -------------------------------------------------------------------------------- Clinic Level of Care Assessment Details Patient Name: [name] Date of Service: 04/15/2019 12:45 PM Medical Record Number: [id number] Patient Account Number: [id number] Date of Birth/Sex: 1950-04-02 (68 y.o. F) Treating RN: Army Melia Primary Care Kohei Antonellis: Harrel Lemon Other Clinician: Referring Koven Belinsky: Harrel Lemon Treating Donnisha Besecker/Extender: Melburn Hake, HOYT Weeks in Treatment: 20 Clinic Level of  Care Assessment Items TOOL 4 Quantity Score []  - Use when only an EandM is performed on FOLLOW-UP visit 0 ASSESSMENTS - Nursing Assessment / Reassessment X - Reassessment of Co-morbidities (includes updates in patient status) 1 10 X- 1 5 Reassessment of Adherence to Treatment Plan ASSESSMENTS - Wound and Skin Assessment / Reassessment []  - Simple Wound Assessment / Reassessment - one wound 0 X- 2 5 Complex Wound Assessment / Reassessment - multiple wounds []  - 0 Dermatologic / Skin Assessment (not related to wound area) ASSESSMENTS - Focused Assessment []  - Circumferential Edema Measurements - multi extremities 0 []  - 0 Nutritional Assessment / Counseling / Intervention []  - 0 Lower Extremity Assessment (monofilament, tuning fork, pulses) []  - 0 Peripheral Arterial Disease Assessment (using hand held doppler) ASSESSMENTS - Ostomy and/or Continence Assessment and Care []  - Incontinence Assessment and Management 0 []  - 0 Ostomy Care Assessment and Management (repouching, etc.) PROCESS - Coordination of Care X - Simple Patient / Family Education for ongoing care 1 15 []  - 0 Complex (extensive) Patient / Family Education for ongoing care []  - 0 Staff obtains Programmer, systems, Records, Test Results / Process Orders []  - 0 Staff telephones HHA, Nursing Homes / Clarify orders / etc []  - 0 Routine Transfer to another Facility (non-emergent condition) []  - 0 Routine Hospital Admission (non-emergent condition) []  - 0 New Admissions / Biomedical engineer / Ordering NPWT, Apligraf, etc. []  - 0 Emergency Hospital Admission (emergent condition) X- 1 10 Simple Discharge Coordination []  - 0 Complex (extensive) Discharge Coordination PROCESS - Special Needs []  - Pediatric / Minor Patient Management 0 []  - 0 Isolation Patient Management []  - 0 Hearing / Language / Visual special needs []  - 0 Assessment of Community assistance (transportation, D/C planning, etc.) [name], [name]  ([id number]) []  - 0 Additional assistance / Altered mentation []  - 0 Support Surface(s) Assessment (bed, cushion, seat, etc.) INTERVENTIONS - Wound Cleansing / Measurement []  - Simple Wound Cleansing - one wound 0 X- 2 5 Complex  Wound Cleansing - multiple wounds X- 1 5 Wound Imaging (photographs - any number of wounds) []  - 0 Wound Tracing (instead of photographs) []  - 0 Simple Wound Measurement - one wound X- 2 5 Complex Wound Measurement - multiple wounds INTERVENTIONS - Wound Dressings []  - Small Wound Dressing one or multiple wounds 0 X- 2 15 Medium Wound Dressing one or multiple wounds []  - 0 Large Wound Dressing one or multiple wounds []  - 0 Application of Medications - topical []  - 0 Application of Medications - injection INTERVENTIONS - Miscellaneous []  - External ear exam 0 []  - 0 Specimen Collection (cultures, biopsies, blood, body fluids, etc.) []  - 0 Specimen(s) / Culture(s) sent or taken to Lab for analysis []  - 0 Patient Transfer (multiple staff / Civil Service fast streamer / Similar devices) []  - 0 Simple Staple / Suture removal (25 or less) []  - 0 Complex Staple / Suture removal (26 or more) []  - 0 Hypo / Hyperglycemic Management (close monitor of Blood Glucose) []  - 0 Ankle / Brachial Index (ABI) - do not check if billed separately X- 1 5 Vital Signs Has the patient been seen at the hospital within the last three years: Yes Total Score: 110 Level Of Care: New/Established - Level 3 Electronic Signature(s) Signed: 04/15/2019 4:36:22 PM By: Army Melia Entered By: Army Melia on 04/15/2019 13:11:21 [name] ([id number]) -------------------------------------------------------------------------------- Encounter Discharge Information Details Patient Name: [name] Date of Service: 04/15/2019 12:45 PM Medical Record Number: [id number] Patient Account Number: [id number] Date of Birth/Sex: 04-13-1950 (68 y.o. F) Treating RN: Army Melia Primary Care  Quentyn Kolbeck: Harrel Lemon Other Clinician: Referring Allyse Fregeau: Harrel Lemon Treating Chevelle Durr/Extender: Melburn Hake, HOYT Weeks in Treatment: 20 Encounter Discharge Information Items Discharge Condition: Stable Ambulatory Status: Wheelchair Discharge Destination: Home Transportation: Private Auto Accompanied By: daughter Schedule Follow-up Appointment: Yes Clinical Summary of Care: Electronic Signature(s) Signed: 04/15/2019 4:36:22 PM By: Army Melia Entered By: Army Melia on 04/15/2019 13:12:04 [place] ([id number]) -------------------------------------------------------------------------------- Lower Extremity Assessment Details Patient Name: [name] Date of Service: 04/15/2019 12:45 PM Medical Record Number: [id number] Patient Account Number: [id number] Date of Birth/Sex: 1950/05/29 (68 y.o. F) Treating RN: Montey Hora Primary Care Jelesa Mangini: Harrel Lemon Other Clinician: Referring Annalia Metzger: Harrel Lemon Treating Seena Ritacco/Extender: Melburn Hake, HOYT Weeks in Treatment: 20 Electronic Signature(s) Signed: 04/15/2019 4:41:53 PM By: Montey Hora Entered By: Montey Hora on 04/15/2019 12:58:19 [place], [name] ([id number]) -------------------------------------------------------------------------------- Multi Wound Chart Details Patient Name: [name] Date of Service: 04/15/2019 12:45 PM Medical Record Number: [id number] Patient Account Number: [id number] Date of Birth/Sex: December 24, 1950 (68 y.o. F) Treating RN: Army Melia Primary Care Bee Marchiano: Harrel Lemon Other Clinician: Referring Jamarion [place]: Harrel Lemon Treating Foxx Klarich/Extender: Melburn Hake, HOYT Weeks in Treatment: 20 Vital Signs Height(in): 59 Pulse(bpm): 39 Weight(lbs): 193 Blood Pressure(mmHg): 109/60 Body Mass Index(BMI): 39 Temperature(F): 98.5 Respiratory Rate(breaths/min): 18 Photos: Wound Location: Left, Medial Upper Leg Right, Posterior Upper Leg Left Gluteus Wounding Event:  Gradually Appeared Gradually Appeared Gradually Appeared Primary Etiology: MASD MASD Pressure Ulcer Comorbid History: N/A N/A Hypertension, Dementia, Neuropathy Date Acquired: 12/31/2018 01/24/2019 04/08/2019 Weeks of Treatment: 14 11 0 Wound Status: Open Healed - Epithelialized Open Clustered Wound: Yes No No Measurements L x W x D (cm) 1.4x2x0.1 0x0x0 1.3x1.1x0.1 Area (cm) : 2.199 0 1.123 Volume (cm) : 0.22 0 0.112 % Reduction in Area: -74.90% 100.00% N/A % Reduction in Volume: -74.60% 100.00% N/A Classification: Full Thickness Without Exposed Full Thickness Without Exposed Category/Stage III Support Structures Support Structures Exudate Amount: N/A N/A Medium Exudate Type: N/A  N/A Serous Exudate Color: N/A N/A amber Wound Margin: N/A N/A Flat and Intact Granulation Amount: N/A N/A Medium (34-66%) Granulation Quality: N/A N/A Pink Necrotic Amount: N/A N/A Medium (34-66%) Epithelialization: N/A N/A Small (1-33%) Treatment Notes Electronic Signature(s) Signed: 04/15/2019 4:36:22 PM By: Army Melia Entered By: Army Melia on 04/15/2019 13:06:41 [place] ([id number]) -------------------------------------------------------------------------------- Multi-Disciplinary Care Plan Details Patient Name: [name] Date of Service: 04/15/2019 12:45 PM Medical Record Number: [id number] Patient Account Number: [id number] Date of Birth/Sex: 1951-02-07 (68 y.o. F) Treating RN: Army Melia Primary Care Lorry Furber: Harrel Lemon Other Clinician: Referring Ayra Hodgdon: Harrel Lemon Treating Kouper Spinella/Extender: Melburn Hake, HOYT Weeks in Treatment: 20 Active Inactive Abuse / Safety / Falls / Self Care Management Nursing Diagnoses: Potential for falls Goals: Patient/caregiver will demonstrate safe use of adaptive devices to increase mobility Date Initiated: 11/22/2018 Target Resolution Date: 12/21/2018 Goal Status: Active Interventions: Assess: immobility, friction, shearing,  incontinence upon admission and as needed Notes: Pressure Nursing Diagnoses: Knowledge deficit related to management of pressures ulcers Goals: Patient/caregiver will verbalize risk factors for pressure ulcer development Date Initiated: 11/22/2018 Target Resolution Date: 12/21/2018 Goal Status: Active Interventions: Assess: immobility, friction, shearing, incontinence upon admission and as needed Notes: Wound/Skin Impairment Nursing Diagnoses: Impaired tissue integrity Goals: Ulcer/skin breakdown will have a volume reduction of 30% by week 4 Date Initiated: 11/22/2018 Target Resolution Date: 12/21/2018 Goal Status: Active Interventions: Assess ulceration(s) every visit Notes: Electronic Signature(s) Signed: 04/15/2019 4:36:22 PM By: Army Melia Entered By: Army Melia on 04/15/2019 13:06:33 [place], [name] ([id number]) [place], [name] ([id number]) -------------------------------------------------------------------------------- Pain Assessment Details Patient Name: [name] Date of Service: 04/15/2019 12:45 PM Medical Record Number: [id number] Patient Account Number: [id number] Date of Birth/Sex: [birth year] (68 y.o. F) Treating RN: Montey Hora Primary Care Johnnette Laux: Harrel Lemon Other Clinician: Referring Darling Cieslewicz: Harrel Lemon Treating Darrick Greenlaw/Extender: Melburn Hake, HOYT Weeks in Treatment: 20 Active Problems Location of Pain Severity and Description of Pain Patient Has Paino No Site Locations Pain Management and Medication Current Pain Management: Electronic Signature(s) Signed: 04/15/2019 4:41:53 PM By: Montey Hora Entered By: Montey Hora on 04/15/2019 12:58:27 [name] ([id number]) -------------------------------------------------------------------------------- Patient/Caregiver Education Details Patient Name: [name] Date of Service: 04/15/2019 12:45 PM Medical Record Number: [id number] Patient Account Number: [id number] Date of  Birth/Gender: Jul 03, 1950 (68 y.o. F) Treating RN: Army Melia Primary Care Physician: Harrel Lemon Other Clinician: Referring Physician: Harrel Lemon Treating Physician/Extender: Sharalyn Ink in Treatment: 20 Education Assessment Education Provided To: Patient Education Topics Provided Wound/Skin Impairment: Handouts: Caring for Your Ulcer Methods: Demonstration, Explain/Verbal Responses: State content correctly Electronic Signature(s) Signed: 04/15/2019 4:36:22 PM By: Army Melia Entered By: Army Melia on 04/15/2019 13:11:32 [place] ([id number]) -------------------------------------------------------------------------------- Wound Assessment Details Patient Name: [name] Date of Service: 04/15/2019 12:45 PM Medical Record Number: [id number] Patient Account Number: [id number] Date of Birth/Sex: 10-27-1950 (68 y.o. F) Treating RN: Montey Hora Primary Care Zenaida Tesar: Harrel Lemon Other Clinician: Referring Jaun Galluzzo: Harrel Lemon Treating Rasa Degrazia/Extender: Melburn Hake, HOYT Weeks in Treatment: 20 Wound Status Wound Number: 4 Primary Etiology: MASD Wound Location: Left, Medial Upper Leg Wound Status: Open Wounding Event: Gradually Appeared Date Acquired: 12/31/2018 Weeks Of Treatment: 14 Clustered Wound: Yes Photos Photo Uploaded By: Montey Hora on 04/15/2019 13:05:19 Wound Measurements Length: (cm) 1.4 Width: (cm) 2 Depth: (cm) 0.1 Area: (cm) 2.199 Volume: (cm) 0.22 % Reduction in Area: -74.9% % Reduction in Volume: -74.6% Wound Description Classification: Full Thickness Without Exposed Support Structu res Treatment Notes Wound #4 (Left, Medial Upper Leg) Notes scell, BFD Electronic Signature(s) Signed: 04/15/2019 4:41:53  PM By: Montey Hora Entered By: Montey Hora on 04/15/2019 13:02:37 [name] ([id number]) -------------------------------------------------------------------------------- Wound Assessment  Details Patient Name: [name] Date of Service: 04/15/2019 12:45 PM Medical Record Number: [id number] Patient Account Number: [id number] Date of Birth/Sex: 26-Aug-1950 (68 y.o. F) Treating RN: Montey Hora Primary Care Carleton [place]: Harrel Lemon Other Clinician: Referring Carolynn Tuley: Harrel Lemon Treating Baron Parmelee/Extender: Melburn Hake, HOYT Weeks in Treatment: 20 Wound Status Wound Number: 6 Primary Etiology: MASD Wound Location: Right, Posterior Upper Leg Wound Status: Healed - Epithelialized Wounding Event: Gradually Appeared Date Acquired: 01/24/2019 Weeks Of Treatment: 11 Clustered Wound: No Photos Photo Uploaded By: Montey Hora on 04/15/2019 13:05:20 Wound Measurements Length: (cm) Width: (cm) Depth: (cm) Area: (cm) Volume: (cm) 0 % Reduction in Area: 100% 0 % Reduction in Volume: 100% 0 0 0 Wound Description Classification: Full Thickness Without Exposed Support Structu res Electronic Signature(s) Signed: 04/15/2019 4:41:53 PM By: Montey Hora Entered By: Montey Hora on 04/15/2019 13:02:38 [name] ([id number]) -------------------------------------------------------------------------------- Wound Assessment Details Patient Name: [name] Date of Service: 04/15/2019 12:45 PM Medical Record Number: [id number] Patient Account Number: [id number] Date of Birth/Sex: 10-Oct-1950 (68 y.o. F) Treating RN: Montey Hora Primary Care Cadyn Fann: Harrel Lemon Other Clinician: Referring Denzil Bristol: Harrel Lemon Treating Shaquan Puerta/Extender: Melburn Hake, HOYT Weeks in Treatment: 20 Wound Status Wound Number: 7 Primary Etiology: Pressure Ulcer Wound Location: Left Gluteus Wound Status: Open Wounding Event: Gradually Appeared Comorbid History: Hypertension, Dementia, Neuropathy Date Acquired: 04/08/2019 Weeks Of Treatment: 0 Clustered Wound: No Photos Photo Uploaded By: Montey Hora on 04/15/2019 13:05:34 Wound Measurements Length: (cm) 1.3 %  Redu Width: (cm) 1.1 % Redu Depth: (cm) 0.1 Epithe Area: (cm) 1.123 [place] Volume: (cm) 0.112 Under ction in Area: ction in Volume: lialization: Small (1-33%) ling: No mining: No Wound Description Classification: Category/Stage III Foul O Wound Margin: Flat and Intact Slough Exudate Amount: Medium Exudate Type: Serous Exudate Color: amber dor After Cleansing: No /Fibrino Yes Wound Bed Granulation Amount: Medium (34-66%) Exposed Structure Granulation Quality: Pink Fascia Exposed: No Necrotic Amount: Medium (34-66%) Fat Layer (Subcutaneous Tissue) Exposed: Yes Necrotic Quality: Adherent Slough Tendon Exposed: No Muscle Exposed: No Joint Exposed: No Bone Exposed: No Treatment Notes Wound #7 (Left Gluteus) Notes scell, BFD Electronic Signature(sEDEN, TOOHEY ([id number]) Signed: 04/15/2019 4:41:53 PM By: Montey Hora Entered By: Montey Hora on 04/15/2019 13:03:39 [name] ([id number]) -------------------------------------------------------------------------------- [place] Details Patient Name: [name] Date of Service: 04/15/2019 12:45 PM Medical Record Number: [id number] Patient Account Number: [id number] Date of Birth/Sex: 1951-01-15 (69 y.o. F) Treating RN: Primary Care Hance Caspers: Harrel Lemon Other Clinician: Referring Sheffield Hawker: Harrel Lemon Treating Tarquin Welcher/Extender: Melburn Hake, HOYT Weeks in Treatment: 20 Vital Signs Time Taken: 12:55 Temperature (F): 98.5 Height (in): 59 Pulse (bpm): 63 Weight (lbs): 193 Respiratory Rate (breaths/min): 18 Body Mass Index (BMI): 39 Blood Pressure (mmHg): 109/60 Reference Range: 80 - 120 mg / dl Electronic Signature(s) Signed: 04/15/2019 4:02:24 PM By: Lorine Bears RCP, RRT, CHT Entered By: Lorine Bears on 04/15/2019 12:57:03

## 2019-04-18 ENCOUNTER — Other Ambulatory Visit: Payer: Medicare Other | Admitting: Nurse Practitioner

## 2019-04-18 ENCOUNTER — Telehealth: Payer: Self-pay | Admitting: Nurse Practitioner

## 2019-04-18 ENCOUNTER — Other Ambulatory Visit: Payer: Self-pay

## 2019-04-18 NOTE — Telephone Encounter (Signed)
I have called multiple times between 2:00pm-2:15pm, busy signal, will continue to attempt to contact for scheduled initial palliative care visit

## 2019-04-19 ENCOUNTER — Telehealth: Payer: Self-pay | Admitting: Nurse Practitioner

## 2019-04-19 NOTE — Progress Notes (Signed)
[name] ([id number]) Visit Report for 04/08/2019 Arrival Information Details Patient Name: [name] Date of Service: 04/08/2019 12:30 PM Medical Record Number: [id number] Patient Account Number: [id number] Date of Birth/Sex: 06-06-1950 (69 y.o. F) Treating RN: [name] Primary Care [name]: [name] Other Clinician: Referring [name]: [name] Treating [name]/Extender: [name], [name] Weeks in Treatment: 11 Visit Information History Since Last Visit Has Dressing in Place as Prescribed: Yes Patient Arrived: Wheel Chair Pain Present Now: No Arrival Time: 12:35 Accompanied By: daughter Transfer Assistance: None Patient Identification Verified: Yes Secondary Verification Process Completed: Yes Patient Requires Transmission-Based Precautions: No Patient Has Alerts: Yes Patient Alerts: NOT Diabetic Electronic Signature(s) Signed: 04/19/2019 5:40:44 PM By: [name], BSN, RN, CWS, Kim RN, BSN Entered By: [name], BSN, RN, CWS, [name] on 04/08/2019 12:36:16 [name] ([id number]) -------------------------------------------------------------------------------- Clinic Level of Care Assessment Details Patient Name: [name] Date of Service: 04/08/2019 12:30 PM Medical Record Number: [id number] Patient Account Number: [id number] Date of Birth/Sex: 23-Jun-1950 (68 y.o. F) Treating RN: [name] Primary Care Shambhavi Salley: [name] Other Clinician: Referring [name]: [name] Treating [name]/Extender: [name], [name] Weeks in Treatment: 19 Clinic Level of Care Assessment Items TOOL 4 Quantity Score []  - Use when only an EandM is performed on FOLLOW-UP visit 0 ASSESSMENTS - Nursing Assessment / Reassessment []  - Reassessment of Co-morbidities (includes updates in patient status) 0 []  - 0 Reassessment of Adherence to Treatment Plan ASSESSMENTS - Wound and Skin Assessment / Reassessment []  - Simple Wound Assessment / Reassessment - one wound 0 X-  2 5 Complex Wound Assessment / Reassessment - multiple wounds []  - 0 Dermatologic / Skin Assessment (not related to wound area) ASSESSMENTS - Focused Assessment []  - Circumferential Edema Measurements - multi extremities 0 []  - 0 Nutritional Assessment / Counseling / Intervention []  - 0 Lower Extremity Assessment (monofilament, tuning fork, pulses) []  - 0 Peripheral Arterial Disease Assessment (using hand held doppler) ASSESSMENTS - Ostomy and/or Continence Assessment and Care []  - Incontinence Assessment and Management 0 []  - 0 Ostomy Care Assessment and Management (repouching, etc.) PROCESS - Coordination of Care X - Simple Patient / Family Education for ongoing care 1 15 []  - 0 Complex (extensive) Patient / Family Education for ongoing care X- 1 10 Staff obtains Programmer, systems, Records, Test Results / Process Orders []  - 0 Staff telephones HHA, Nursing Homes / Clarify orders / etc []  - 0 Routine Transfer to another Facility (non-emergent condition) []  - 0 Routine Hospital Admission (non-emergent condition) []  - 0 New Admissions / Biomedical engineer / Ordering NPWT, Apligraf, etc. []  - 0 Emergency Hospital Admission (emergent condition) X- 1 10 Simple Discharge Coordination []  - 0 Complex (extensive) Discharge Coordination PROCESS - Special Needs []  - Pediatric / Minor Patient Management 0 []  - 0 Isolation Patient Management []  - 0 Hearing / Language / Visual special needs []  - 0 Assessment of Community assistance (transportation, D/C planning, etc.) Emmet, [name] ([id number]) []  - 0 Additional assistance / Altered mentation []  - 0 Support Surface(s) Assessment (bed, cushion, seat, etc.) INTERVENTIONS - Wound Cleansing / Measurement []  - Simple Wound Cleansing - one wound 0 X- 2 5 Complex Wound Cleansing - multiple wounds X- 1 5 Wound Imaging (photographs - any number of wounds) []  - 0 Wound Tracing (instead of photographs) []  - 0 Simple Wound Measurement  - one wound X- 2 5 Complex Wound Measurement - multiple wounds INTERVENTIONS - Wound Dressings []  - Small Wound Dressing one or multiple wounds 0 X- 2 15 Medium  Wound Dressing one or multiple wounds []  - 0 Large Wound Dressing one or multiple wounds []  - 0 Application of Medications - topical []  - 0 Application of Medications - injection INTERVENTIONS - Miscellaneous []  - External ear exam 0 []  - 0 Specimen Collection (cultures, biopsies, blood, body fluids, etc.) []  - 0 Specimen(s) / Culture(s) sent or taken to Lab for analysis []  - 0 Patient Transfer (multiple staff / [name] Lift / Similar devices) []  - 0 Simple Staple / Suture removal (25 or less) []  - 0 Complex Staple / Suture removal (26 or more) []  - 0 Hypo / Hyperglycemic Management (close monitor of Blood Glucose) []  - 0 Ankle / Brachial Index (ABI) - do not check if billed separately X- 1 5 Vital Signs Has the patient been seen at the hospital within the last three years: Yes Total Score: 105 Level Of Care: New/Established - Level 3 Electronic Signature(s) Signed: 04/19/2019 5:40:44 PM By: [name], BSN, RN, CWS, Kim RN, BSN Entered By: [name], BSN, RN, CWS, [name] on 04/08/2019 12:55:49 [name] ([id number]) -------------------------------------------------------------------------------- Encounter Discharge Information Details Patient Name: [name] Date of Service: 04/08/2019 12:30 PM Medical Record Number: [id number] Patient Account Number: [id number] Date of Birth/Sex: 04-Mar-1950 (68 y.o. F) Treating RN: [name] Primary Care [name]: [name] Other Clinician: Referring [name]: [name] Treating [name]/Extender: [name], [name] Weeks in Treatment: 51 Encounter Discharge Information Items Discharge Condition: Stable Ambulatory Status: Ambulatory Discharge Destination: Home Transportation: Private Auto Accompanied By: daughter Schedule Follow-up Appointment: Yes Clinical Summary  of Care: Electronic Signature(s) Signed: 04/19/2019 5:40:44 PM By: [name], BSN, RN, CWS, Kim RN, BSN Entered By: [name], BSN, RN, CWS, [name] on 04/08/2019 12:57:22 NATALEE, TOMKIEWICZ ([id number]) -------------------------------------------------------------------------------- Lower Extremity Assessment Details Patient Name: [name] Date of Service: 04/08/2019 12:30 PM Medical Record Number: [id number] Patient Account Number: [id number] Date of Birth/Sex: [birth year] (68 y.o. F) Treating RN: [name] Primary Care Solange Emry: [name] Other Clinician: Referring Adalaide Jaskolski: [name] Treating Tecia Cinnamon/Extender: Sharalyn Ink in Treatment: 10 Electronic Signature(s) Signed: 04/19/2019 5:40:44 PM By: [name], BSN, RN, CWS, Kim RN, BSN Entered By: [name], BSN, RN, CWS, [name] on 04/08/2019 12:47:30 [name] ([id number]) -------------------------------------------------------------------------------- Multi Wound Chart Details Patient Name: [name] Date of Service: 04/08/2019 12:30 PM Medical Record Number: [id number] Patient Account Number: [id number] Date of Birth/Sex: 12-15-1950 (68 y.o. F) Treating RN: [name] Primary Care Freddy Spadafora: [name] Other Clinician: Referring Samaiyah Howes: [name] Treating Jeren Dufrane/Extender: [name], [name] Weeks in Treatment: 19 Vital Signs Height(in): 59 Pulse(bpm): 58 Weight(lbs): 193 Blood Pressure(mmHg): 125/59 Body Mass Index(BMI): 39 Temperature(F): 98.5 Respiratory Rate(breaths/min): 16 Photos: [N/A:N/A] Wound Location: Left, Medial Upper Leg Right, Posterior Upper Leg N/A Wounding Event: Gradually Appeared Gradually Appeared N/A Primary Etiology: MASD MASD N/A Date Acquired: 12/31/2018 01/24/2019 N/A Weeks of Treatment: 13 10 N/A Wound Status: Open Open N/A Clustered Wound: Yes No N/A Measurements L x W x D (cm) 1.1x1.4x0.1 0.4x0.8x0.1 N/A Area (cm) : 1.21 0.251 N/A Volume (cm) : 0.121 0.025 N/A %  Reduction in Area: 3.70% -14.10% N/A % Reduction in Volume: 4.00% -13.60% N/A Classification: Full Thickness Without Exposed Full Thickness Without Exposed N/A Support Structures Support Structures Treatment Notes Electronic Signature(s) Signed: 04/19/2019 5:40:44 PM By: [name], BSN, RN, CWS, Kim RN, BSN Entered By: [name], BSN, RN, CWS, [name] on 04/08/2019 12:54:18 DUHA, ABAIR ([id number]) -------------------------------------------------------------------------------- Multi-Disciplinary Care Plan Details Patient Name: [name] Date of Service: 04/08/2019 12:30 PM Medical Record Number: [id number] Patient Account Number: [id number] Date of Birth/Sex: 08/26/1950 (  69 y.o. F) Treating RN: [name] Primary Care Arisa Congleton: [name] Other Clinician: Referring Maitland Muhlbauer: [name] Treating Buddy Loeffelholz/Extender: [name], [name] Weeks in Treatment: 64 Active Inactive Abuse / Safety / Falls / Self Care Management Nursing Diagnoses: Potential for falls Goals: Patient/caregiver will demonstrate safe use of adaptive devices to increase mobility Date Initiated: 11/22/2018 Target Resolution Date: 12/21/2018 Goal Status: Active Interventions: Assess: immobility, friction, shearing, incontinence upon admission and as needed Notes: Pressure Nursing Diagnoses: Knowledge deficit related to management of pressures ulcers Goals: Patient/caregiver will verbalize risk factors for pressure ulcer development Date Initiated: 11/22/2018 Target Resolution Date: 12/21/2018 Goal Status: Active Interventions: Assess: immobility, friction, shearing, incontinence upon admission and as needed Notes: Wound/Skin Impairment Nursing Diagnoses: Impaired tissue integrity Goals: Ulcer/skin breakdown will have a volume reduction of 30% by week 4 Date Initiated: 11/22/2018 Target Resolution Date: 12/21/2018 Goal Status: Active Interventions: Assess ulceration(s) every visit Notes: Electronic  Signature(s) Signed: 04/19/2019 5:40:44 PM By: [name], BSN, RN, CWS, Kim RN, BSN Entered By: [name], BSN, RN, CWS, [name] on 04/08/2019 12:54:12 MACKENSEY, BOLTE ([id number]) [place], [name] ([id number]) -------------------------------------------------------------------------------- Pain Assessment Details Patient Name: [name] Date of Service: 04/08/2019 12:30 PM Medical Record Number: [id number] Patient Account Number: [id number] Date of Birth/Sex: April 28, 1950 (68 y.o. F) Treating RN: [name] Primary Care Erisha Paugh: [name] Other Clinician: Referring Marce Charlesworth: [name] Treating Ryett Hamman/Extender: [name], [name] Weeks in Treatment: 52 Active Problems Location of Pain Severity and Description of Pain Patient Has Paino No Site Locations Pain Management and Medication Current Pain Management: Notes Patient denies pain at this time. Electronic Signature(s) Signed: 04/19/2019 5:40:44 PM By: [name], BSN, RN, CWS, Kim RN, BSN Entered By: [name], BSN, RN, CWS, [name] on 04/08/2019 12:37:58 JADIE, ALLINGTON ([id number]) -------------------------------------------------------------------------------- Patient/Caregiver Education Details Patient Name: [name] Date of Service: 04/08/2019 12:30 PM Medical Record Number: [id number] Patient Account Number: [id number] Date of Birth/Gender: 12/18/1950 (68 y.o. F) Treating RN: [name] Primary Care Physician: [name] Other Clinician: Referring Physician: Harrel Navarro Treating Physician/Extender: Sharalyn Ink in Treatment: 33 Education Assessment Education Provided To: Patient Education Topics Provided Wound/Skin Impairment: Handouts: Caring for Your Ulcer Methods: Demonstration, Explain/Verbal Responses: State content correctly Electronic Signature(s) Signed: 04/19/2019 5:40:44 PM By: [name], BSN, RN, CWS, Kim RN, BSN Entered By: [name], BSN, RN, CWS, [name] on 04/08/2019 12:56:06 [name]  ([id number]) -------------------------------------------------------------------------------- Wound Assessment Details Patient Name: [name] Date of Service: 04/08/2019 12:30 PM Medical Record Number: [id number] Patient Account Number: [id number] Date of Birth/Sex: 24-Sep-1950 (68 y.o. F) Treating RN: [name] Primary Care Nomar Broad: [name] Other Clinician: Referring Saydi Kobel: [name] Treating Tenzin Edelman/Extender: [name], [name] Weeks in Treatment: 19 Wound Status Wound Number: 4 Primary Etiology: MASD Wound Location: Left, Medial Upper Leg Wound Status: Open Wounding Event: Gradually Appeared Date Acquired: 12/31/2018 Weeks Of Treatment: 13 Clustered Wound: Yes Photos Photo Uploaded By: [name], BSN, RN, CWS, [name] on 04/08/2019 12:49:44 Wound Measurements Length: (cm) 1.1 Width: (cm) 1.4 Depth: (cm) 0.1 Area: (cm) 1.21 Volume: (cm) 0.121 % Reduction in Area: 3.7% % Reduction in Volume: 4% Wound Description Classification: Full Thickness Without Exposed Support Structu res Treatment Notes Wound #4 (Left, Medial Upper Leg) Notes silver cell, BFD Electronic Signature(s) Signed: 04/19/2019 5:40:44 PM By: [name], BSN, RN, CWS, Kim RN, BSN Entered By: [name], BSN, RN, CWS, [name] on 04/08/2019 12:45:30 [name] ([id number]) -------------------------------------------------------------------------------- Wound Assessment Details Patient Name: [name] Date of Service: 04/08/2019 12:30 PM Medical Record Number: [id number] Patient Account Number: [id number] Date of Birth/Sex: 1950/08/30 (68  y.o. F) Treating RN: [name] Primary Care Corban Kistler: [name] Other Clinician: Referring Jonathin Heinicke: [name] Treating Jacelyn Cuen/Extender: [name], [name] Weeks in Treatment: 19 Wound Status Wound Number: 6 Primary Etiology: MASD Wound Location: Right, Posterior Upper Leg Wound Status: Open Wounding Event: Gradually Appeared Date Acquired:  01/24/2019 Weeks Of Treatment: 10 Clustered Wound: No Photos Photo Uploaded By: [name], BSN, RN, CWS, [name] on 04/08/2019 12:49:45 Wound Measurements Length: (cm) 0.4 Width: (cm) 0.8 Depth: (cm) 0.1 Area: (cm) 0.251 Volume: (cm) 0.025 % Reduction in Area: -14.1% % Reduction in Volume: -13.6% Wound Description Classification: Full Thickness Without Exposed Support Structu res Engineer, maintenance) Signed: 04/19/2019 5:40:44 PM By: [name], BSN, RN, CWS, Kim RN, BSN Entered By: [name], BSN, RN, CWS, [name] on 04/08/2019 12:45:30 [name] ([id number]) -------------------------------------------------------------------------------- [place] Details Patient Name: [name] Date of Service: 04/08/2019 12:30 PM Medical Record Number: [id number] Patient Account Number: [id number] Date of Birth/Sex: [birth year] (68 y.o. F) Treating RN: [name] Primary Care Wane Mollett: [name] Other Clinician: Referring Jacqualyn Sedgwick: [name] Treating Estelita Iten/Extender: [name], [name] Weeks in Treatment: 19 Vital Signs Time Taken: 12:36 Temperature (F): 98.5 Height (in): 59 Pulse (bpm): 58 Weight (lbs): 193 Respiratory Rate (breaths/min): 16 Body Mass Index (BMI): 39 Blood Pressure (mmHg): 125/59 Reference Range: 80 - 120 mg / dl Electronic Signature(s) Signed: 04/19/2019 5:40:44 PM By: [name], BSN, RN, CWS, Kim RN, BSN Entered By: [name], BSN, RN, CWS, [name] on 04/08/2019 12:37:37

## 2019-04-19 NOTE — Telephone Encounter (Signed)
After receiving email from Natalia Leatherwood, NP she requested that I contact daughter back to see if she could reschedule the Palliative Consult for 04/22/19 @ 5 PM.  Called daughter and relayed this information and she was in agreement with this.  Notified NP.

## 2019-04-19 NOTE — Telephone Encounter (Signed)
Rec'd call from daughter stating that the NP did not call her yesterday.  After checking into this what had happened was I had typed in the daughter's phone number incorrectly on the NP schedule to call.  I apologized to the daughter for the mistake and she was understanding and okay with rescheduling another appointment.  I have rescheduled the Telephone Palliative Consult for 05/02/19 @ 3 PM.  Notified NP.

## 2019-04-22 ENCOUNTER — Other Ambulatory Visit: Payer: Self-pay

## 2019-04-22 ENCOUNTER — Other Ambulatory Visit: Payer: Medicare Other | Admitting: Nurse Practitioner

## 2019-04-22 ENCOUNTER — Encounter: Payer: Self-pay | Admitting: Nurse Practitioner

## 2019-04-22 ENCOUNTER — Encounter: Payer: Medicare Other | Admitting: Physician Assistant

## 2019-04-22 DIAGNOSIS — L98411 Non-pressure chronic ulcer of buttock limited to breakdown of skin: Secondary | ICD-10-CM | POA: Diagnosis not present

## 2019-04-22 DIAGNOSIS — L97111 Non-pressure chronic ulcer of right thigh limited to breakdown of skin: Secondary | ICD-10-CM | POA: Diagnosis not present

## 2019-04-22 DIAGNOSIS — F0390 Unspecified dementia without behavioral disturbance: Secondary | ICD-10-CM

## 2019-04-22 DIAGNOSIS — L2489 Irritant contact dermatitis due to other agents: Secondary | ICD-10-CM | POA: Diagnosis present

## 2019-04-22 DIAGNOSIS — Z940 Kidney transplant status: Secondary | ICD-10-CM | POA: Diagnosis not present

## 2019-04-22 DIAGNOSIS — L97121 Non-pressure chronic ulcer of left thigh limited to breakdown of skin: Secondary | ICD-10-CM | POA: Diagnosis not present

## 2019-04-22 DIAGNOSIS — Z515 Encounter for palliative care: Secondary | ICD-10-CM

## 2019-04-22 DIAGNOSIS — I10 Essential (primary) hypertension: Secondary | ICD-10-CM | POA: Diagnosis not present

## 2019-04-22 NOTE — Progress Notes (Addendum)
JANIS, SOL ([id number]) Visit Report for 04/22/2019 Chief Complaint Document Details Patient Name: [name], [name] Date of Service: 04/22/2019 12:30 PM Medical Record Number: [id number] Patient Account Number: [id number] Date of Birth/Sex: 03-09-1950 (69 y.o. F) Treating RN: Primary Care Provider: Harrel Lemon Other Clinician: Referring Provider: Harrel Lemon Treating Provider/Extender: Melburn Hake, Aland Chestnutt Weeks in Treatment: 21 Information Obtained from: Patient Chief Complaint Gluteal and bilateral upper leg ulcers Electronic Signature(s) Signed: 04/22/2019 12:57:18 PM By: Worthy Keeler PA-C Entered By: Worthy Keeler on 04/22/2019 12:57:18 [place], [name] ([id number]) -------------------------------------------------------------------------------- HPI Details Patient Name: [name] Date of Service: 04/22/2019 12:30 PM Medical Record Number: [id number] Patient Account Number: [id number] Date of Birth/Sex: 06/22/1950 (68 y.o. F) Treating RN: Primary Care Provider: Harrel Lemon Other Clinician: Referring Provider: Harrel Lemon Treating Provider/Extender: Melburn Hake, Ketra Duchesne Weeks in Treatment: 21 History of Present Illness HPI Description: 11/22/2018 patient presents today for initial evaluation in our clinic concerning issues that she has been having with a wound in the left gluteal fold. This has been present since August and was noted initially when she was in the hospital. Fortunately there is no signs of active infection at this time. No fever chills noted. Up to this point DuoDERM has been utilized although I am not sure that is the best treatment option for her based on what I am seeing currently. This actually started as a result of moisture associated skin damage secondary to a C. difficile infection. The patient does have dementia. She is also status post having had a kidney transplant and does have hypertension as well. She does spend a lot of her time during the day  sitting which is probably a part of why this is having trouble healing although it is not really a pressure ulcer that may be contributing to the lack of significant and rapid improvement. She still is having diarrhea as well and is still on treatment for the C. difficile infection she is on Dificid. They have been using barrier cream in the gluteal region to try to help protect the area where the dressing was not. 11/29/2018 upon evaluation today patient does not appear to be doing significantly better at this time. She still had some somewhat softer stools but not really as much diarrhea as what she had when the C. difficile infection was not full cycle. Overall things seem to be doing better in that regard although she does have 2 new areas on evaluation today. There is no signs of active infection. No fevers, chills, nausea, vomiting, or diarrhea. 12/06/2018 on evaluation today patient actually appears to be doing better with regard to the wounds in the gluteal region in general. Overall the biggest issue has been that apparently one of the wounds was missed by the home health nurse and not noted to be open and its been getting stuck to her brief. Nonetheless other than that and even with that considered she still seems to actually be doing quite well as far as I am seeing today. 12/20/2018 on evaluation today patient appears to be doing very well in regard to her wounds. Fortunately there is no sign of active infection at this time which is great news. She has been tolerating the dressing changes without complication although [facility] is no longer coming out they just discontinued the patient's treatment all of a sudden standing that they were complete as far as what they could do. Nonetheless unfortunately the patient still has wounds open and has not had any dressings to  put on the area they have been doing the best they could until they came in here today. There is no evidence of  infection and things seem to be doing quite well. 01/03/2019 on evaluation today patient appears to be doing okay in some regards regarding her wounds. Specifically the main wound on the left gluteal fold in the right gluteal fold seems to be doing much better. The right gluteal fold in fact is pretty much healed. On the left this still has a little bit deeper area open at this point I think that still needs a bandage. Unfortunately she got different bandages than the silicone, Allevyn, bandages that we had recommended and ordered for her. For that reason she unfortunately does have some additional breakdown around where the bandages have been pulling at this time. This has caused several areas of skin breakdown unfortunately. Fortunately there is no signs of active infection at this time. No fevers, chills, nausea, vomiting, or diarrhea. 01/17/2019 on evaluation today patient appears to be having a lot of issues with skin tearing due to adhesive's from the border foam dressing that was sent from the medical supplier. With that being said we specifically ordered Allevyn dressings I am not sure why they sent the other and less it has a insurance issue with not actually covering the dressings that they feel like that is better for her. With that being said there does not appear to be any signs of active infection at this point which is good news. Nonetheless she is still having pain. She is seen with a caregiver here today. 01/24/2019 on evaluation today patient appears to be doing well with regard to her wounds. In fact she is significantly improved as compared to last week's evaluation. Having the right dressings, Allevyn, has been of utmost importance for her this does not seem to damage her skin which is excellent news. Overall the patient also does not seem to be having as much pain. She again is seen with her caregiver during the office visit today and her caregiver is very pleased with how things  seem to be progressing with the proper treatments. 03/18/2019 patient is currently being seen after unfortunately having been in the hospital a couple of times since we last saw her for various reasons unrelated to her wounds. Unfortunately she does still have some of the wounds present at this time. There is no signs of active infection which is good news at least. Home health has been coming out to see her it sounds like they have been using silver alginate dressings on the area. She mainly has regions open on her upper leg/thigh region bilaterally there was an area in the gluteal region but this appears to likely be closed today. 03/25/2019 upon evaluation today patient appears to be doing very well with regard to her wounds. She has been tolerating the dressing changes and fortunately there is no signs of active infection at this time. No fevers, chills, nausea, vomiting, or diarrhea. The wounds on the left medial upper leg as well as the right posterior upper leg. 04/01/2019 upon evaluation today patient appears to be doing well with regard to her wounds. I feel like she is making good progress fracture seeing some improvement in his overall quality today. Hopefully this will continue to progress accordingly. Fortunately there is no signs of active infection at this time. 04/08/2019 upon evaluation today patient actually appears to be doing better with regard to her wounds in general. She is having  much less pain and in fact did not really seem to experience any pain when I was cleaning the wounds today. This is great news and a big change even from last week. 04/15/2019 upon evaluation today patient actually appears to be doing well and in fact has 1 healed wound and 1 remaining that was the larger of the 2. Unfortunately she has a gluteal ulcer which is new and this is on the left gluteal region. Fortunately there is no signs of active infection at this time. No [place], Asya ([id number]) fevers,  chills, nausea, vomiting, or diarrhea. 04/22/2019 upon evaluation today patient appears to be doing fairly well with regard to her wounds. Both are looking better although not necessarily measuring a whole lot smaller. The wound surface at least is healthier. In general I am very pleased with this she is still having some discomfort. Electronic Signature(s) Signed: 04/22/2019 6:22:12 PM By: Worthy Keeler PA-C Entered By: Worthy Keeler on 04/22/2019 18:22:12 AAHANA, ELZA ([id number]) -------------------------------------------------------------------------------- Physical Exam Details Patient Name: [name] Date of Service: 04/22/2019 12:30 PM Medical Record Number: [id number] Patient Account Number: [id number] Date of Birth/Sex: 1950/08/08 (68 y.o. F) Treating RN: Primary Care Provider: Harrel Lemon Other Clinician: Referring Provider: Harrel Lemon Treating Provider/Extender: STONE III, Yeraldy Spike Weeks in Treatment: 21 Constitutional Well-nourished and well-hydrated in no acute distress. Respiratory normal breathing without difficulty. Psychiatric this patient is able to make decisions and demonstrates good insight into disease process. Alert and Oriented x 3. pleasant and cooperative. Notes Patient's wound bed again at both locations appears to be doing quite well. I am very pleased with the overall appearance which seems to be improved compared to previous is a nice beefy red color whereas before we did have some slough and film buildup. I see none of that today. Electronic Signature(s) Signed: 04/22/2019 6:22:28 PM By: Worthy Keeler PA-C Entered By: Worthy Keeler on 04/22/2019 18:22:28 BEKAH, IGOE ([id number]) -------------------------------------------------------------------------------- Physician Orders Details Patient Name: [name] Date of Service: 04/22/2019 12:30 PM Medical Record Number: [id number] Patient Account Number: [id number] Date of Birth/Sex:  May 08, 1950 (68 y.o. 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 - in office o Cleanse wound with mild soap and water Wound #7 Left Gluteus o Clean wound with Normal Saline. - in office o Cleanse wound with mild soap and water Skin Barriers/Peri-Wound Care Wound #4 Left,Medial Upper Leg o Skin Prep - periwound Wound #7 Left Gluteus o Skin Prep - periwound Primary Wound Dressing Wound #4 Left,Medial Upper Leg o Silver Collagen Wound #7 Left Gluteus o Silver Collagen Secondary Dressing Wound #4 Left,Medial Upper Leg o Boardered Foam Dressing Wound #7 Left Gluteus o Boardered Foam Dressing Dressing Change Frequency Wound #4 Left,Medial Upper Leg o Other: - twice weekly Wound #7 Left Gluteus o Other: - twice weekly Follow-up Appointments Wound #4 Left,Medial Upper Leg o Return Appointment in 1 week. [place], Melika ([id number]) Wound #7 Left Gluteus   o Return Appointment in 1 week. Off-Loading Wound #4 Left,Medial Upper Leg o Turn and reposition every 2 hours o Other: - memory foam cushion while sitting, rolled towel between legs when sitting, if possible. Wound #7 Left Gluteus o Turn and reposition every 2 hours o Other: - memory foam  cushion while sitting, rolled towel between legs when sitting, if possible. Home Health Wound #4 Left,Medial Upper Leg o Continue Home Health Visits - [place] Nurse may visit PRN to address patientos wound care needs. o FACE TO FACE ENCOUNTER: MEDICARE and MEDICAID PATIENTS: I certify that this patient is under my care and that I had a face-to- face encounter that meets the physician face-to-face encounter requirements with this patient on this date. The encounter with the patient was in whole or in part for the following MEDICAL CONDITION: (primary reason for [place]) MEDICAL NECESSITY: I certify, that based on my findings, NURSING services are a medically necessary home health service. HOME BOUND STATUS: I certify that my clinical findings support that this patient is homebound (i.e., Due to illness or injury, pt requires aid of supportive devices such as crutches, cane, wheelchairs, walkers, the use of special transportation or the assistance of another person to leave their place of residence. There is a normal inability to leave the home and doing so requires considerable and taxing effort. Other absences are for medical reasons / religious services and are infrequent or of short duration when for other reasons). o If current dressing causes regression in wound condition, may D/C ordered dressing product/s and apply Normal Saline Moist Dressing daily until next [place] / Other MD appointment. [place] of regression in wound condition at [phone]. o Please direct any NON-WOUND related issues/requests for orders to patient's Primary Care Physician Wound #7 Left [place] Visits - [place] Nurse may visit PRN to address patientos wound care needs. o FACE TO FACE ENCOUNTER: MEDICARE and MEDICAID PATIENTS: I certify that this patient is under my care and that I had a face-to- face  encounter that meets the physician face-to-face encounter requirements with this patient on this date. The encounter with the patient was in whole or in part for the following MEDICAL CONDITION: (primary reason for [place]) MEDICAL NECESSITY: I certify, that based on my findings, NURSING services are a medically necessary home health service. HOME BOUND STATUS: I certify that my clinical findings support that this patient is homebound (i.e., Due to illness or injury, pt requires aid of supportive devices such as crutches, cane, wheelchairs, walkers, the use of special transportation or the assistance of another person to leave their place of residence. There is a normal inability to leave the home and doing so requires considerable and taxing effort. Other absences are for medical reasons / religious services and are infrequent or of short duration when for other reasons). o If current dressing causes regression in wound condition, may D/C ordered dressing product/s and apply Normal Saline Moist Dressing daily until next [place] / Other MD appointment. [place] of regression in wound condition at [phone]. o Please direct any NON-WOUND related issues/requests for orders to patient's Primary Care Physician Electronic Signature(s) Signed: 04/22/2019 4:58:25 PM By: Army Melia Signed: 04/23/2019 12:55:39 AM By: Worthy Keeler PA-C Entered By: Army Melia on 04/22/2019 13:02:41 JANAYSHA, DEPAULO ([id number]) -------------------------------------------------------------------------------- Problem List Details Patient Name: [name] Date of Service: 04/22/2019 12:30 PM Medical  Record Number: [id number] Patient Account Number: [id number] Date of Birth/Sex: 20-Aug-1950 (68 y.o. 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 03/18/2019 No Yes L97.111 Non-pressure chronic ulcer of right thigh limited to breakdown of skin 03/18/2019 No Yes F03.90 Unspecified dementia without behavioral disturbance 11/22/2018 No Yes Z94.0 Kidney transplant status 11/22/2018 No Yes I10 Essential (primary) hypertension 11/22/2018 No Yes Inactive Problems Resolved Problems Electronic Signature(s) Signed: 04/22/2019 12:57:12 PM By: Worthy Keeler PA-C Entered By: Worthy Keeler on 04/22/2019 12:57:12 Olivia Lopez de Gutier[place], [name] ([id number]) -------------------------------------------------------------------------------- Progress Note Details Patient Name: [name] Date of Service: 04/22/2019 12:30 PM Medical Record Number: [id number] Patient Account Number: [id number] Date of Birth/Sex: 19-Dec-1950 (68 y.o. F) Treating RN: Primary Care Provider: Harrel Lemon Other Clinician: Referring Provider: Harrel Lemon Treating Provider/Extender: Melburn Hake, Amandy Chubbuck Weeks in Treatment: 21 Subjective Chief Complaint Information obtained from Patient Gluteal and bilateral upper leg ulcers History of Present Illness (HPI) 11/22/2018 patient presents today for initial evaluation in our clinic concerning issues that she has been having with a wound in the left gluteal fold. This has been present since August and was noted initially when she was in the hospital. Fortunately there is no signs of active infection at this time. No fever chills noted. Up to this point DuoDERM has been utilized although I am not sure that is the best treatment option for her based on what I am seeing currently. This actually started as a result of  moisture associated skin damage secondary to a C. difficile infection. The patient does have dementia. She is also status post having had a kidney transplant and does have hypertension as well. She does spend a lot of her time during the day sitting which is probably a part of why this is having trouble healing although it is not really a pressure ulcer that may be contributing to the lack of significant and rapid improvement. She still is having diarrhea as well and is still on treatment for the C. difficile infection she is on Dificid. They have been using barrier cream in the gluteal region to try to help protect the area where the dressing was not. 11/29/2018 upon evaluation today patient does not appear to be doing significantly better at this time. She still had some somewhat softer stools but not really as much diarrhea as what she had when the C. difficile infection was not full cycle. Overall things seem to be doing better in that regard although she does have 2 new areas on evaluation today. There is no signs of active infection. No fevers, chills, nausea, vomiting, or diarrhea. 12/06/2018 on evaluation today patient actually appears to be doing better with regard to the wounds in the gluteal region in general. Overall the biggest issue has been that apparently one of the wounds was missed by the home health nurse and not noted to be open and its been getting stuck to her brief. Nonetheless other than that and even with that considered she still seems to actually be doing quite well as far as I am seeing today. 12/20/2018 on evaluation today patient appears to be doing very well in regard  to her wounds. Fortunately there is no sign of active infection at this time which is great news. She has been tolerating the dressing changes without complication although [facility] is no longer coming out they just discontinued the patient's treatment all of a sudden standing that they were complete  as far as what they could do. Nonetheless unfortunately the patient still has wounds open and has not had any dressings to put on the area they have been doing the best they could until they came in here today. There is no evidence of infection and things seem to be doing quite well. 01/03/2019 on evaluation today patient appears to be doing okay in some regards regarding her wounds. Specifically the main wound on the left gluteal fold in the right gluteal fold seems to be doing much better. The right gluteal fold in fact is pretty much healed. On the left this still has a little bit deeper area open at this point I think that still needs a bandage. Unfortunately she got different bandages than the silicone, Allevyn, bandages that we had recommended and ordered for her. For that reason she unfortunately does have some additional breakdown around where the bandages have been pulling at this time. This has caused several areas of skin breakdown unfortunately. Fortunately there is no signs of active infection at this time. No fevers, chills, nausea, vomiting, or diarrhea. 01/17/2019 on evaluation today patient appears to be having a lot of issues with skin tearing due to adhesive's from the border foam dressing that was sent from the medical supplier. With that being said we specifically ordered Allevyn dressings I am not sure why they sent the other and less it has a insurance issue with not actually covering the dressings that they feel like that is better for her. With that being said there does not appear to be any signs of active infection at this point which is good news. Nonetheless she is still having pain. She is seen with a caregiver here today. 01/24/2019 on evaluation today patient appears to be doing well with regard to her wounds. In fact she is significantly improved as compared to last week's evaluation. Having the right dressings, Allevyn, has been of utmost importance for her this does  not seem to damage her skin which is excellent news. Overall the patient also does not seem to be having as much pain. She again is seen with her caregiver during the office visit today and her caregiver is very pleased with how things seem to be progressing with the proper treatments. 03/18/2019 patient is currently being seen after unfortunately having been in the hospital a couple of times since we last saw her for various reasons unrelated to her wounds. Unfortunately she does still have some of the wounds present at this time. There is no signs of active infection which is good news at least. Home health has been coming out to see her it sounds like they have been using silver alginate dressings on the area. She mainly has regions open on her upper leg/thigh region bilaterally there was an area in the gluteal region but this appears to likely be closed today. 03/25/2019 upon evaluation today patient appears to be doing very well with regard to her wounds. She has been tolerating the dressing changes and fortunately there is no signs of active infection at this time. No fevers, chills, nausea, vomiting, or diarrhea. The wounds on the left medial upper leg as well as the right posterior  upper leg. 04/01/2019 upon evaluation today patient appears to be doing well with regard to her wounds. I feel like she is making good progress fracture seeing some improvement in his overall quality today. Hopefully this will continue to progress accordingly. Fortunately there is no signs of active infection at this time. STEPHANI, JANAK ([id number]) 04/08/2019 upon evaluation today patient actually appears to be doing better with regard to her wounds in general. She is having much less pain and in fact did not really seem to experience any pain when I was cleaning the wounds today. This is great news and a big change even from last week. 04/15/2019 upon evaluation today patient actually appears to be doing well and in  fact has 1 healed wound and 1 remaining that was the larger of the 2. Unfortunately she has a gluteal ulcer which is new and this is on the left gluteal region. Fortunately there is no signs of active infection at this time. No fevers, chills, nausea, vomiting, or diarrhea. 04/22/2019 upon evaluation today patient appears to be doing fairly well with regard to her wounds. Both are looking better although not necessarily measuring a whole lot smaller. The wound surface at least is healthier. In general I am very pleased with this she is still having some discomfort. Objective Constitutional Well-nourished and well-hydrated in no acute distress. Vitals Time Taken: 12:43 PM, Height: 59 in, Weight: 193 lbs, BMI: 39, Temperature: 98.4 F, Pulse: 57 bpm, Respiratory Rate: 16 breaths/min, Blood Pressure: 117/57 mmHg. Respiratory normal breathing without difficulty. Psychiatric this patient is able to make decisions and demonstrates good insight into disease process. Alert and Oriented x 3. pleasant and cooperative. General Notes: Patient's wound bed again at both locations appears to be doing quite well. I am very pleased with the overall appearance which seems to be improved compared to previous is a nice beefy red color whereas before we did have some slough and film buildup. I see none of that today. Integumentary (Hair, Skin) Wound #4 status is Open. Original cause of wound was Gradually Appeared. The wound is located on the Left,Medial Upper Leg. The wound measures 2.7cm length x 2.4cm width x 0.1cm depth; 5.089cm^2 area and 0.509cm^3 volume. There is Fat Layer (Subcutaneous Tissue) Exposed exposed. There is no tunneling or undermining noted. There is a medium amount of serous drainage noted. The wound margin is flat and intact. There is large (67-100%) pink granulation within the wound bed. There is a small (1-33%) amount of necrotic tissue within the wound bed including Adherent Slough. Wound  #7 status is Open. Original cause of wound was Gradually Appeared. The wound is located on the Left Gluteus. The wound measures 1.8cm length x 1.2cm width x 0.1cm depth; 1.696cm^2 area and 0.17cm^3 volume. There is Fat Layer (Subcutaneous Tissue) Exposed exposed. There is no tunneling or undermining noted. There is a medium amount of serous drainage noted. The wound margin is flat and intact. There is medium (34-66%) pink granulation within the wound bed. There is a medium (34-66%) amount of necrotic tissue within the wound bed including Adherent Slough. 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 -  in office Cleanse wound with mild soap and water Wound #7 Left Gluteus: Clean wound with Normal Saline. - in office Cleanse wound with mild soap and water Skin Barriers/Peri-Wound Care: Wound #4 Left,Medial Upper Leg: Skin Prep - periwound Wound #7 Left Gluteus: Skin Prep - periwound Primary Wound Dressing: Wound #4 Left,Medial Upper Leg: Silver Collagen Wound #7 Left Gluteus: Silver Collagen Secondary Dressing: Wound #4 Left,Medial Upper Leg: Boardered Foam Dressing Wound #7 Left Gluteus: Boardered Foam Dressing Dressing Change Frequency: Wound #4 Left,Medial Upper Leg: Other: - twice weekly Wound #7 Left Gluteus: Other: - twice weekly Follow-up Appointments: Wound #4 Left,Medial Upper Leg: Return Appointment in 1 week. Wound #7 Left Gluteus: Return Appointment in 1 week. Off-Loading: Wound #4 Left,Medial  Upper Leg: Turn and reposition every 2 hours Other: - memory foam cushion while sitting, rolled towel between legs when sitting, if possible. Wound #7 Left Gluteus: Turn and reposition every 2 hours Other: - memory foam cushion while sitting, rolled towel between legs when sitting, if possible. Home Health: Wound #4 Left,Medial Upper Leg: Continue Home Health Visits - [place] Nurse may visit PRN to address patient s wound care needs. FACE TO FACE ENCOUNTER: MEDICARE and MEDICAID PATIENTS: I certify that this patient is under my care and that I had a face-to-face encounter that meets the physician face-to-face encounter requirements with this patient on this date. The encounter with the patient was in whole or in part for the following MEDICAL CONDITION: (primary reason for [place]) MEDICAL NECESSITY: I certify, that based on my findings, NURSING services are a medically necessary home health service. HOME BOUND STATUS: I certify that my clinical findings support that this patient is homebound (i.e., Due to illness or injury, pt requires aid of supportive devices such as crutches, cane, wheelchairs, walkers, the use of special transportation or the assistance of another person to leave their place of residence. There is a normal inability to leave the home and doing so requires considerable and taxing effort. Other absences are for medical reasons / religious services and are infrequent or of short duration when for other reasons). If current dressing causes regression in wound condition, may D/C ordered dressing product/s and apply Normal Saline Moist Dressing daily until next [place] / Other MD appointment. [place] of regression in wound condition at [phone]. Please direct any NON-WOUND related issues/requests for orders to patient's Primary Care Physician Wound #7 Left Gluteus: [place]  Nurse may visit PRN to address patient s wound care needs. FACE TO FACE ENCOUNTER: MEDICARE and MEDICAID PATIENTS: I certify that this patient is under my care and that I had a face-to-face encounter that meets the physician face-to-face encounter requirements with this patient on this date. The encounter with the patient was in whole or in part for the following MEDICAL CONDITION: (primary reason for [place]) MEDICAL NECESSITY: I certify, that based on my findings, NURSING services are a medically necessary home health service. HOME BOUND STATUS: I certify that my clinical findings support that this patient is homebound (i.e., Due to illness or injury, pt requires aid of supportive devices such as crutches, cane, wheelchairs, walkers, the use of special transportation or the assistance of another person to leave their place of residence. There is a normal inability to leave the home and doing so requires considerable and taxing effort. Other absences are for medical reasons / religious services and are infrequent or of  short duration when for other reasons). If current dressing causes regression in wound condition, may D/C ordered dressing product/s and apply Normal Saline Moist Dressing daily until next [place] / Other MD appointment. [place] of regression in wound condition at [phone]. Please direct any NON-WOUND related issues/requests for orders to patient's Primary Care Physician [place], NAZARETH ([id number]) 1. My suggestion at this time is can be that we go ahead and continue with the current wound care measures utilizing the alginate dressings I think that still probably the best option for the patient and her daughter is in agreement with plan. 2. I am also going to suggest they continue with appropriate offloading they have been try to be very careful about this I think that is good. 3. I am also going to suggest that the patient have these  dressings changed at least every other day and then more often if needed if saturated. We will see patient back for reevaluation in 1 week here in the clinic. If anything worsens or changes patient will contact our office for additional recommendations. Electronic Signature(s) Signed: 04/22/2019 6:23:03 PM By: Worthy Keeler PA-C Entered By: Worthy Keeler on 04/22/2019 18:23:03 BRIGHTON, DELIO ([id number]) -------------------------------------------------------------------------------- SuperBill Details Patient Name: [name] Date of Service: 04/22/2019 Medical Record Number: [id number] Patient Account Number: [id number] Date of Birth/Sex: 08-Apr-1950 (68 y.o. 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 F03.90 Unspecified dementia without behavioral disturbance Z94.0 Kidney transplant status I10 Essential (primary) hypertension Facility Procedures CPT4 Code: 24462863 Description: 99213 - WOUND CARE VISIT-LEV 3 EST PT Modifier: Quantity: 1 Physician Procedures CPT4 Code: 8177116 Description: 57903 - WC PHYS LEVEL 3 - EST PT Modifier: Quantity: 1 CPT4 Code: Description: ICD-10 Diagnosis Description L24.89 Irritant contact dermatitis due to other agents L98.411 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

## 2019-04-22 NOTE — Progress Notes (Addendum)
[facility] Consult Note Telephone: [phone]  Fax: [phone]  PATIENT NAME: [name] DOB: 1950-06-25 MRN: [id number]  PRIMARY CARE PROVIDER:   Baxter Hire, MD  REFERRING PROVIDER:  Baxter Hire, MD [place],  [place] [address]   Due to the COVID-19 crisis, this visit was done via telemedicine from my office and it was initiated and consent by this patient and or family.  I was asked by Dr Edwina Barth to see [name] for scheduled initial palliative care consult for goals of care   RECOMMENDATIONS and PLAN:  1. ACP: full code with aggressive interventions with Ms. Danne Navarro, [name] daughter further discussing with [name] to make a DNR. [name] endorses she would not want to put her mother through CPR but needs to further discuss with father. We talked about hospice services, [name], daughter in agreement to have Hospice Physicians to review case as she talks more to [name] about medical goals of care, hospice services provided.    2. Palliative care encounter; Palliative medicine team will continue to support patient, patient's family, and medical team. Visit consisted of counseling and education dealing with the complex and emotionally intense issues of symptom management and palliative care in the setting of serious and potentially life-threatening illness  I spent 70 minutes providing this consultation,  from 4:30pm to 5:40pm. More than 50% of the time in this consultation was spent coordinating communication.   HISTORY OF PRESENT ILLNESS:  [name] is a 69 y.o. year old female with multiple medical problems including dementia, Uterine cancer, kidney cancer s/p nephrectomy with kidney transplant (Deceased donor renal transplant 11/2017 done at [facility]) Immunosuppression therapy with sirolimus 3 mg daily and mycophenolate 180 mg twice a day, ckd, dementia,  hypertension, anemia, gerd, failure to thrive, AV fistula placement, abdominal hysterectomy. Saw Dr Candiss Norse nephrology 2 / 23 / 2021. Her documentation she has lost weight from 193lbs in November 2020 to 167lbs 2 / 23 / 2021. Hospitalize 1 / 12 / 2021 for acute kidney injury secondary to dehydration with altered mental status in urinary tract infection from klebsiella improve with IV hydration. A Nephrology visit family was concerned about losing weight, not eating much and not much appetite. 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 Seen in the emergency department at [facility] for elevated creatinine but left a few hours after being treated and went to [facility].I called Ms Danne Baxter, Ms Navarro's daughter for scheduled initial palliative care telephonic as video not available visit. I explain purpose of palliative care visit. Ms Danne Navarro in agreement. We talked about [name] past medical history including dementia and renal transplant. We talked about multiple ER visits. We talked about diagnosis of dementia about 5 years ago when she was having trouble with repeating the same words. Dementia has now progressed functionally over the last year she now requires someone to help her from a sitting position to standing. [name] is able to stand when someone holds her up from behind and take a few steps. Ms Danne Navarro endorses that she does bathe and dress Ms Navarro. Ms Navarro also is able to urinate in the commode when brought they are although she is incontinent at night. Ms Danne Navarro endorses Ms Navarro has to be fed. Ms Navarro has been pocketing food and it takes over an hour to feed each meal. Ms Danne Navarro has lost 26 lb in  four months. Ms Danne Navarro endorses it just seems like she forgets how to swallow. We talked about dementia and depth and the inability of her brain to signal to swallow, chew. We talked about life review. Ms Navarro is married although her  husband does not live with her. [name] has a son and a daughter. Her son has been incarcerated but currently not involved in her care. [name] does live with Ms Danne Navarro her daughter who has been making decisions. We talked about help that Ms Danne Navarro hired to come into the home. Ms Danne Navarro it has been really hard as she has had to decrease her work hours to one day a week. We talked about medical goals of care is currently she is a full code with aggressive interventions. Ms Danne Navarro endorses once understood after discussing several cpr scenarios that she does not want that for her mother. Ms Danne Navarro endorses that she will need to further discuss with her father. For now she will remain a full code and will revisit with next palliative care visit. We talked about hospice benefit under Medicare program. We talked about hospice criteria. [name] lost over 18% of body weight in the setting of progressive dementia. Ms Danne Navarro endorses she is interested in hospice. [name] was in agreement to have Hospice Physicians review the case. Discuss with Ms Danne Navarro give her time to l review hospice option and code status, medical goals with her father. Will schedule follow-up appointment in two days which will give Ms Danne Navarro time to talk to her father. We talked about role of palliative care and plan of care. Therapeutic listening and emotional support provided. Contact information provided. Questions answered to satisfaction.  Hospitalized 01/31/2019 to 02/05/2019 for intermit body shaking; +UTI, AKI on CKD IV improved with IVF/Lasix held.   Hospitalized 02/26/2019 to 03/07/2019 not eating, drinking, ARF with infection. Treated infection but creatinine did not improve. FTT, severe dementia   04/06/2019 ED visit for diarrhea with history of c-diff and recent antibiotics for UTI  04/15/2019 ED visit for abnormal labs potassium 5.5 and creatinine of 3.1 which was close to what they were a week prior in nephrology  office  Palliative Care was asked to help address goals of care.   CODE STATUS: full code  PPS: 40% HOSPICE ELIGIBILITY/DIAGNOSIS: TBD  PAST MEDICAL HISTORY:  Past Medical History:  Diagnosis Date  . Anemia   . C. difficile colitis   . Cancer of kidney ([place])   . Dementia ([place])   . GERD (gastroesophageal reflux disease)   . Hypertension   . Renal disorder   . Uterine cancer ([place])     SOCIAL HX:  Social History   Tobacco Use  . Smoking status: Former Research scientist (life sciences)  . Smokeless tobacco: Never Used  Substance Use Topics  . Alcohol use: No    ALLERGIES:  Allergies  Allergen Reactions  . Ace Inhibitors Cough  . Latex Swelling  . Lactose Intolerance (Gi)   . Lisinopril Cough     PERTINENT MEDICATIONS:  Outpatient Encounter Medications as of 04/22/2019  Medication Sig  . amLODipine (NORVASC) 5 MG tablet Take 1 tablet (5 mg total) by mouth daily. New blood pressure medication.  Marland Kitchen atorvastatin (LIPITOR) 40 MG tablet Take 40 mg by mouth at bedtime.   . calcitRIOL (ROCALTROL) 0.25 MCG capsule Take 0.25 mcg by mouth daily.  . calcium elemental as carbonate (TUMS ULTRA 1000) 400 MG chewable tablet Chew 1,000 mg by mouth 2 (two) times daily.   Marland Kitchen  cholecalciferol (VITAMIN D3) 25 MCG (1000 UT) tablet Take 1,000 Units by mouth daily.  . citalopram (CELEXA) 20 MG tablet Take 20 mg by mouth daily.  Marland Kitchen donepezil (ARICEPT) 5 MG tablet Take 5 mg by mouth at bedtime.   . feeding supplement, ENSURE ENLIVE, (ENSURE ENLIVE) LIQD Take 237 mLs by mouth 2 (two) times daily between meals.  . folic acid (FOLVITE) 1 MG tablet Take 1 mg by mouth 2 (two) times daily.   . furosemide (LASIX) 20 MG tablet Hold until follow up with kidney doctor.  . metoprolol tartrate (LOPRESSOR) 25 MG tablet Take 25 mg by mouth 2 (two) times daily.  . mycophenolate (MYFORTIC) 180 MG EC tablet Take 180 mg by mouth 2 (two) times daily.  . potassium chloride (KLOR-CON) 10 MEQ tablet Hold until follow up with kidney doctor  .  Saccharomyces boulardii (PROBIOTIC) 250 MG CAPS Take 1 capsule by mouth 2 (two) times daily.  . sirolimus (RAPAMUNE) 1 MG tablet Take 3 mg by mouth daily.   . traZODone (DESYREL) 50 MG tablet Take 50 mg by mouth at bedtime.    No facility-administered encounter medications on file as of 04/22/2019.    PHYSICAL EXAM:   Deferred Kapena Hamme Z Ronav Furney, NP

## 2019-04-23 NOTE — Progress Notes (Addendum)
[name], [place] ([id number]) Visit Report for 04/22/2019 Arrival Information Details Patient Name: [name], [name] Date of Service: 04/22/2019 12:30 PM Medical Record Number: [id number] Patient Account Number: [id number] Date of Birth/Sex: 1950/10/17 (69 y.o. F) Treating RN: Montey Hora Primary Care Zykee Avakian: Harrel Lemon Other Clinician: Referring Alfonzia Woolum: Harrel Lemon Treating Darianny Momon/Extender: Melburn Hake, HOYT Weeks in Treatment: 21 Visit Information History Since Last Visit Added or deleted any medications: No Patient Arrived: Wheel Chair Any new allergies or adverse reactions: No Arrival Time: 12:42 Had a fall or experienced change in No Accompanied By: daughter activities of daily living that may affect Transfer Assistance: Manual risk of falls: Patient Identification Verified: Yes Signs or symptoms of abuse/neglect since last visito No Secondary Verification Process Completed: Yes Hospitalized since last visit: No Patient Requires Transmission-Based Precautions: No Implantable device outside of the clinic excluding No Patient Has Alerts: Yes cellular tissue based products placed in the center Patient Alerts: NOT Diabetic since last visit: Has Dressing in Place as Prescribed: Yes Pain Present Now: No Electronic Signature(s) Signed: 04/22/2019 5:03:14 PM By: Montey Hora Entered By: Montey Hora on 04/22/2019 12:42:46 [name] ([id number]) -------------------------------------------------------------------------------- Clinic Level of Care Assessment Details Patient Name: [name] Date of Service: 04/22/2019 12:30 PM Medical Record Number: [id number] Patient Account Number: [id number] Date of Birth/Sex: Sep 06, 1950 (69 y.o. F) Treating RN: Army Melia Primary Care Dejion Grillo: Harrel Lemon Other Clinician: Referring Jaionna Weisse: Harrel Lemon Treating Lexington Krotz/Extender: Melburn Hake, HOYT Weeks in Treatment: 21 Clinic Level of Care Assessment Items TOOL  4 Quantity Score []  - Use when only an EandM is performed on FOLLOW-UP visit 0 ASSESSMENTS - Nursing Assessment / Reassessment X - Reassessment of Co-morbidities (includes updates in patient status) 1 10 X- 1 5 Reassessment of Adherence to Treatment Plan ASSESSMENTS - Wound and Skin Assessment / Reassessment []  - Simple Wound Assessment / Reassessment - one wound 0 X- 2 5 Complex Wound Assessment / Reassessment - multiple wounds []  - 0 Dermatologic / Skin Assessment (not related to wound area) ASSESSMENTS - Focused Assessment []  - Circumferential Edema Measurements - multi extremities 0 []  - 0 Nutritional Assessment / Counseling / Intervention []  - 0 Lower Extremity Assessment (monofilament, tuning fork, pulses) []  - 0 Peripheral Arterial Disease Assessment (using hand held doppler) ASSESSMENTS - Ostomy and/or Continence Assessment and Care []  - Incontinence Assessment and Management 0 []  - 0 Ostomy Care Assessment and Management (repouching, etc.) PROCESS - Coordination of Care X - Simple Patient / Family Education for ongoing care 1 15 []  - 0 Complex (extensive) Patient / Family Education for ongoing care []  - 0 Staff obtains Programmer, systems, Records, Test Results / Process Orders []  - 0 Staff telephones HHA, Nursing Homes / Clarify orders / etc []  - 0 Routine Transfer to another Facility (non-emergent condition) []  - 0 Routine Hospital Admission (non-emergent condition) []  - 0 New Admissions / Biomedical engineer / Ordering NPWT, Apligraf, etc. []  - 0 Emergency Hospital Admission (emergent condition) X- 1 10 Simple Discharge Coordination []  - 0 Complex (extensive) Discharge Coordination PROCESS - Special Needs []  - Pediatric / Minor Patient Management 0 []  - 0 Isolation Patient Management []  - 0 Hearing / Language / Visual special needs []  - 0 Assessment of Community assistance (transportation, D/C planning, etc.) [place], [place] ([id number]) []  - 0 Additional  assistance / Altered mentation []  - 0 Support Surface(s) Assessment (bed, cushion, seat, etc.) INTERVENTIONS - Wound Cleansing / Measurement []  - Simple Wound Cleansing - one wound 0 X- 2 5 Complex Wound Cleansing -  multiple wounds X- 1 5 Wound Imaging (photographs - any number of wounds) []  - 0 Wound Tracing (instead of photographs) []  - 0 Simple Wound Measurement - one wound X- 2 5 Complex Wound Measurement - multiple wounds INTERVENTIONS - Wound Dressings []  - Small Wound Dressing one or multiple wounds 0 X- 2 15 Medium Wound Dressing one or multiple wounds []  - 0 Large Wound Dressing one or multiple wounds []  - 0 Application of Medications - topical []  - 0 Application of Medications - injection INTERVENTIONS - Miscellaneous []  - External ear exam 0 []  - 0 Specimen Collection (cultures, biopsies, blood, body fluids, etc.) []  - 0 Specimen(s) / Culture(s) sent or taken to Lab for analysis []  - 0 Patient Transfer (multiple staff / Civil Service fast streamer / Similar devices) []  - 0 Simple Staple / Suture removal (25 or less) []  - 0 Complex Staple / Suture removal (26 or more) []  - 0 Hypo / Hyperglycemic Management (close monitor of Blood Glucose) []  - 0 Ankle / Brachial Index (ABI) - do not check if billed separately X- 1 5 Vital Signs Has the patient been seen at the hospital within the last three years: Yes Total Score: 110 Level Of Care: New/Established - Level 3 Electronic Signature(s) Signed: 04/22/2019 4:58:25 PM By: Army Melia Entered By: Army Melia on 04/22/2019 13:03:07 [place] ([id number]) -------------------------------------------------------------------------------- Complex / Palliative Patient Assessment Details Patient Name: [name] Date of Service: 04/22/2019 12:30 PM Medical Record Number: [id number] Patient Account Number: [id number] Date of Birth/Sex: 12/25/1950 (69 y.o. F) Treating RN: Cornell Barman Primary Care Daleisa Halperin: Harrel Lemon Other  Clinician: Referring Linnie Mcglocklin: Harrel Lemon Treating Lakeitha Basques/Extender: Melburn Hake, HOYT Weeks in Treatment: 21 Palliative Management Criteria Complex Wound Management Criteria Patient has remarkable or complex co-morbidities requiring medications or treatments that extend wound healing times. Examples: o Diabetes mellitus with chronic renal failure or end stage renal disease requiring dialysis o Advanced or poorly controlled rheumatoid arthritis o Diabetes mellitus and end stage chronic obstructive pulmonary disease o Active cancer with current chemo- or radiation therapy Dementia, unable to follow simple commands Care Approach Wound Care Plan: Complex Wound Management Electronic Signature(s) Signed: 04/23/2019 12:41:41 PM By: Gretta Cool, BSN, RN, CWS, Kim RN, BSN Signed: 04/23/2019 4:47:23 PM By: Worthy Keeler PA-C Entered By: Gretta Cool, BSN, RN, CWS, Kim on 04/23/2019 12:41:40 [name] ([id number]) -------------------------------------------------------------------------------- Encounter Discharge Information Details Patient Name: [name] Date of Service: 04/22/2019 12:30 PM Medical Record Number: [id number] Patient Account Number: [id number] Date of Birth/Sex: May 08, 1950 (69 y.o. F) Treating RN: Army Melia Primary Care Lorece Keach: Harrel Lemon Other Clinician: Referring Jyron Turman: Harrel Lemon Treating Ernan Runkles/Extender: Melburn Hake, HOYT Weeks in Treatment: 21 Encounter Discharge Information Items Discharge Condition: Stable Ambulatory Status: Wheelchair Discharge Destination: Home Transportation: Private Auto Accompanied By: daughter Schedule Follow-up Appointment: Yes Clinical Summary of Care: Electronic Signature(s) Signed: 04/22/2019 4:58:25 PM By: Army Melia Entered By: Army Melia on 04/22/2019 13:04:00 [place] ([id number]) -------------------------------------------------------------------------------- Lower Extremity Assessment  Details Patient Name: [name] Date of Service: 04/22/2019 12:30 PM Medical Record Number: [id number] Patient Account Number: [id number] Date of Birth/Sex: 03/21/1950 (69 y.o. F) Treating RN: Montey Hora Primary Care Graciemae Delisle: Harrel Lemon Other Clinician: Referring Cleopatra Sardo: Harrel Lemon Treating Montay Vanvoorhis/Extender: Melburn Hake, HOYT Weeks in Treatment: 21 Electronic Signature(s) Signed: 04/22/2019 5:03:14 PM By: Montey Hora Entered By: Montey Hora on 04/22/2019 12:42:58 DONYEA, GAFFORD ([id number]) -------------------------------------------------------------------------------- Multi Wound Chart Details Patient Name: [name] Date of Service: 04/22/2019 12:30 PM Medical Record Number: [id number] Patient Account Number: [id number] Date  of Birth/Sex: 1951-01-29 (69 y.o. F) Treating RN: Army Melia Primary Care Kleigh Hoelzer: Harrel Lemon Other Clinician: Referring Quentina Fronek: Harrel Lemon Treating Jr Milliron/Extender: Melburn Hake, HOYT Weeks in Treatment: 21 Vital Signs Height(in): 33 Pulse(bpm): 47 Weight(lbs): 193 Blood Pressure(mmHg): 117/57 Body Mass Index(BMI): 39 Temperature(F): 98.4 Respiratory Rate(breaths/min): 16 Photos: [N/A:N/A] Wound Location: Left Upper Leg - Medial Left Gluteus N/A Wounding Event: Gradually Appeared Gradually Appeared N/A Primary Etiology: MASD Pressure Ulcer N/A Comorbid History: Hypertension, Dementia, Neuropathy Hypertension, Dementia, Neuropathy N/A Date Acquired: 12/31/2018 04/08/2019 N/A Weeks of Treatment: 15 1 N/A Wound Status: Open Open N/A Clustered Wound: Yes No N/A Clustered Quantity: 1 N/A N/A Measurements L x W x D (cm) 2.7x2.4x0.1 1.8x1.2x0.1 N/A Area (cm) : 5.089 1.696 N/A Volume (cm) : 0.509 0.17 N/A % Reduction in Area: -304.90% -51.00% N/A % Reduction in Volume: -304.00% -51.80% N/A Classification: Full Thickness Without Exposed Category/Stage III N/A Support Structures Exudate Amount: Medium Medium  N/A Exudate Type: Serous Serous N/A Exudate Color: amber amber N/A Wound Margin: Flat and Intact Flat and Intact N/A Granulation Amount: Large (67-100%) Medium (34-66%) N/A Granulation Quality: Pink Pink N/A Necrotic Amount: Small (1-33%) Medium (34-66%) N/A Exposed Structures: Fat Layer (Subcutaneous Tissue) Fat Layer (Subcutaneous Tissue) N/A Exposed: Yes Exposed: Yes Fascia: No Fascia: No Tendon: No Tendon: No Muscle: No Muscle: No Joint: No Joint: No Bone: No Bone: No Epithelialization: None Small (1-33%) N/A Treatment Notes Electronic Signature(s) Signed: 04/22/2019 4:58:25 PM By: Barnie Mort, Ariyanah ([id number]) Entered By: Army Melia on 04/22/2019 13:01:03 [name] ([id number]) -------------------------------------------------------------------------------- Multi-Disciplinary Care Plan Details Patient Name: [name] Date of Service: 04/22/2019 12:30 PM Medical Record Number: [id number] Patient Account Number: [id number] Date of Birth/Sex: 1950-05-08 (69 y.o. F) Treating RN: Army Melia Primary Care Autry Droege: Harrel Lemon Other Clinician: Referring Monseratt Ledin: Harrel Lemon Treating Wilgus Deyton/Extender: Sharalyn Ink in Treatment: 21 Active Inactive Electronic Signature(s) Signed: 05/06/2019 5:05:59 PM By: Gretta Cool, BSN, RN, CWS, Kim RN, BSN Signed: 05/13/2019 11:52:58 AM By: Army Melia Previous Signature: 04/22/2019 4:58:25 PM Version By: Army Melia Entered By: Gretta Cool BSN, RN, CWS, Kim on 05/06/2019 17:05:58 ALLEIGH, MOLLICA ([id number]) -------------------------------------------------------------------------------- Pain Assessment Details Patient Name: [name] Date of Service: 04/22/2019 12:30 PM Medical Record Number: [id number] Patient Account Number: [id number] Date of Birth/Sex: 1950/07/25 (69 y.o. F) Treating RN: Montey Hora Primary Care Clevie Prout: Harrel Lemon Other Clinician: Referring Luz Burcher: Harrel Lemon Treating Donyel Nester/Extender: Melburn Hake, HOYT Weeks in Treatment: 21 Active Problems Location of Pain Severity and Description of Pain Patient Has Paino No Site Locations Pain Management and Medication Current Pain Management: Electronic Signature(s) Signed: 04/22/2019 5:03:14 PM By: Montey Hora Entered By: Montey Hora on 04/22/2019 12:43:07 [name] ([id number]) -------------------------------------------------------------------------------- Patient/Caregiver Education Details Patient Name: [name] Date of Service: 04/22/2019 12:30 PM Medical Record Number: [id number] Patient Account Number: [id number] Date of Birth/Gender: Feb 20, 1950 (69 y.o. F) Treating RN: Army Melia Primary Care Physician: Harrel Lemon Other Clinician: Referring Physician: Harrel Lemon Treating Physician/Extender: Sharalyn Ink in Treatment: 21 Education Assessment Education Provided To: Patient Education Topics Provided Wound/Skin Impairment: Handouts: Caring for Your Ulcer Methods: Demonstration, Explain/Verbal Responses: State content correctly Electronic Signature(s) Signed: 04/22/2019 4:58:25 PM By: Army Melia Entered By: Army Melia on 04/22/2019 13:03:26 [place] ([id number]) -------------------------------------------------------------------------------- Wound Assessment Details Patient Name: [name] Date of Service: 04/22/2019 12:30 PM Medical Record Number: [id number] Patient Account Number: [id number] Date of Birth/Sex: 1950-05-10 (69 y.o. F) Treating RN: Montey Hora Primary Care Berklie Dethlefs: Harrel Lemon Other Clinician: Referring Dante Cooter: Harrel Lemon Treating Fabrizio Filip/Extender:  STONE III, HOYT Weeks in Treatment: 21 Wound Status Wound Number: 4 Primary Etiology: MASD Wound Location: Left Upper Leg - Medial Wound Status: Open Wounding Event: Gradually Appeared Comorbid History: Hypertension, Dementia, Neuropathy Date Acquired:  12/31/2018 Weeks Of Treatment: 15 Clustered Wound: Yes Photos Wound Measurements Length: (cm) 2.7 Width: (cm) 2.4 Depth: (cm) 0.1 Clustered Quantity: 1 Area: (cm) 5.089 Volume: (cm) 0.509 % Reduction in Area: -304.9% % Reduction in Volume: -304% Epithelialization: None Tunneling: No Undermining: No Wound Description Classification: Full Thickness Without Exposed Support Structures Wound Margin: Flat and Intact Exudate Amount: Medium Exudate Type: Serous Exudate Color: amber Foul Odor After Cleansing: No Slough/Fibrino Yes Wound Bed Granulation Amount: Large (67-100%) Exposed Structure Granulation Quality: Pink Fascia Exposed: No Necrotic Amount: Small (1-33%) Fat Layer (Subcutaneous Tissue) Exposed: Yes Necrotic Quality: Adherent Slough Tendon Exposed: No Muscle Exposed: No Joint Exposed: No Bone Exposed: No Electronic Signature(s) Signed: 04/22/2019 5:03:14 PM By: Montey Hora Entered By: Montey Hora on 04/22/2019 12:52:19 [name] ([id number]) -------------------------------------------------------------------------------- Wound Assessment Details Patient Name: [name] Date of Service: 04/22/2019 12:30 PM Medical Record Number: [id number] Patient Account Number: [id number] Date of Birth/Sex: 04-07-1950 (69 y.o. F) Treating RN: Montey Hora Primary Care Uziel Covault: Harrel Lemon Other Clinician: Referring Rozanna Cormany: Harrel Lemon Treating Edahi Kroening/Extender: Melburn Hake, HOYT Weeks in Treatment: 21 Wound Status Wound Number: 7 Primary Etiology: Pressure Ulcer Wound Location: Left Gluteus Wound Status: Open Wounding Event: Gradually Appeared Comorbid History: Hypertension, Dementia, Neuropathy Date Acquired: 04/08/2019 Weeks Of Treatment: 1 Clustered Wound: No Photos Wound Measurements Length: (cm) 1.8 Width: (cm) 1.2 Depth: (cm) 0.1 Area: (cm) 1.696 Volume: (cm) 0.17 % Reduction in Area: -51% % Reduction in Volume:  -51.8% Epithelialization: Small (1-33%) Tunneling: No Undermining: No Wound Description Classification: Category/Stage III Wound Margin: Flat and Intact Exudate Amount: Medium Exudate Type: Serous Exudate Color: amber Foul Odor After Cleansing: No Slough/Fibrino Yes Wound Bed Granulation Amount: Medium (34-66%) Exposed Structure Granulation Quality: Pink Fascia Exposed: No Necrotic Amount: Medium (34-66%) Fat Layer (Subcutaneous Tissue) Exposed: Yes Necrotic Quality: Adherent Slough Tendon Exposed: No Muscle Exposed: No Joint Exposed: No Bone Exposed: No Electronic Signature(s) Signed: 04/22/2019 5:03:14 PM By: Montey Hora Entered By: Montey Hora on 04/22/2019 12:52:42 [name] ([id number]) -------------------------------------------------------------------------------- [place] Details Patient Name: [name] Date of Service: 04/22/2019 12:30 PM Medical Record Number: [id number] Patient Account Number: [id number] Date of Birth/Sex: 28-Jun-1950 (69 y.o. F) Treating RN: Montey Hora Primary Care Deetya Drouillard: Harrel Lemon Other Clinician: Referring Renesha Lizama: Harrel Lemon Treating Danyel Griess/Extender: Melburn Hake, HOYT Weeks in Treatment: 21 Vital Signs Time Taken: 12:43 Temperature (F): 98.4 Height (in): 59 Pulse (bpm): 57 Weight (lbs): 193 Respiratory Rate (breaths/min): 16 Body Mass Index (BMI): 39 Blood Pressure (mmHg): 117/57 Reference Range: 80 - 120 mg / dl Electronic Signature(s) Signed: 04/22/2019 5:03:14 PM By: Montey Hora Entered By: Montey Hora on 04/22/2019 12:44:43

## 2019-04-24 ENCOUNTER — Other Ambulatory Visit: Payer: Medicare Other | Admitting: Nurse Practitioner

## 2019-04-24 ENCOUNTER — Encounter: Payer: Self-pay | Admitting: Nurse Practitioner

## 2019-04-24 DIAGNOSIS — F0390 Unspecified dementia without behavioral disturbance: Secondary | ICD-10-CM

## 2019-04-24 DIAGNOSIS — Z515 Encounter for palliative care: Secondary | ICD-10-CM

## 2019-04-24 NOTE — Progress Notes (Signed)
[place] Consult Note Telephone: [phone]  Fax: [phone]  PATIENT NAME: [name] DOB: 05/17/1950 MRN: [id number]  PRIMARY CARE PROVIDER:   Baxter Hire, MD  REFERRING PROVIDER:  Dr Kerby Moors  Nephrologist [facility], Transplant Team  Due to the COVID-19 crisis, this visit was done via telemedicine from my office and it was initiated and consent by this patient and or family.  RECOMMENDATIONS and PLAN: 1.ACP: Code status changed to DNR, placed in Vynca/Epic. Wishes are for Hospice services, with medical goals to focus on comfort. Request place to Dr Edwina Barth for Hospice order.   2.Palliative care encounter; Palliative medicine team will continue to support patient, patient's family, and medical team. Visit consisted of counseling and education dealing with the complex and emotionally intense issues of symptom management and palliative care in the setting of serious and potentially life-threatening illness  I spent 65 minutes providing this consultation,  from 11:45am to 12:50pm. More than 50% of the time in this consultation was spent coordinating communication.   HISTORY OF PRESENT ILLNESS:  [name] is a 69 y.o. year old female with multiple medical problems including dementia, Uterine cancer, kidney cancer s/p nephrectomy with kidney transplant (Deceased donor renal transplant 11/2017 done [facility]) Immunosuppression therapy with sirolimus 3 mg daily and mycophenolate 180 mg twice a day, ckd, dementia, hypertension, anemia, gerd, failure to thrive, AV fistula placement, abdominal hysterectomy. Saw Dr Candiss Norse nephrology 2 / 23 / 2021. Her documentation she has lost weight from 193lbs in November 2020 to 167lbs 2 / 23 / 2021. Hospitalize 1 / 12 / 2021 for acute kidney injury secondary to dehydration with altered mental status in urinary tract infection from klebsiella improve with IV hydration. Sheri.  Navarro daughter return call for schedule palliative care follow-up visit. We talked about how [name] was today. [name] [phone] endorses that [name] is having even more difficulty. [name] pocketing her medicines this morning and it was great to faculty getting her to take them. [name] endorses that it has been very difficult to get her to eat or take any oral fluids today. We talked about chronic disease progression of dementia. We talked about Dr Candiss Norse Nephrology, his feedback concerning option of Hospice Services. We talked about Dr Kerby Moors Transplant Nephrologist with discussion concerning immunosuppression and Hospice Services. We talked about medical goals of care aggressive versus conservative versus comfort care. We talked about Hospice Services as Hospice Physicians felt like [name] was eligible though hospice would not cover immunosuppression therapy. Would need to be covered by Family / patient. Discuss with [name] work out with Dr. Kerby Moors Transplant Nephrologist that will give 90 day supply to get fill prior to signing with Hospice. She will be able to continue on medication with goal for comfort, decrease symptoms fluid overload, dyspnea. [name] verbalized understanding. We talked about appointment with Dr Candiss Norse Nephrology which is tomorrow. We talked about hemodialysis should renal function worse than. [name] endorses that wishes are not to have hemodialysis. We talked about wound care center. We talked about Hospice Services that the Hospice Nurse can continue the recommendations of wound care in the home rather than [name] going to the wound care clinic. We re-visited what Hospice would look like for services provided. We talked about what would change including focus would be on comfort care with goal to keep at home to increase quality of life, not quantity of days. We talked about continuing immunosuppression  therapy and to pick up prescription prior  to signing for Hospice Services so it mean if therapy medication will be in home. Continue immunosuppression therapy per Dr Kerby Moors then have six months supply. We talked about wound care would be performed buy hospice nurse and going to the wound care center with stop. [name] in agreement and wishes to proceed with Hospice Services. We talked about code status as she is a full code. [name] endorses that she spoke with her brother, father and herself among conversation to proceed with Hospice Services in DNR order. [name] forms completed. Will put in Epic/Vynca, put in Hospice admission packet. Discuss with [name] will contact Dr. Tillman Sers office, Dr. Keturah Barre office, Dr. Kerby Moors, Jeri Cos [facility] office for decision made to proceed with hospice, no dialysis, continue immunosuppression with medications in the home, wound care provided by Hospice Nurse. Notified [place]. Notify Dr. Tillman Sers office of wishes for Hospice order. Message left. Therapeutic listening and emotional support provided for [name]. Questions answer to satisfaction. Contact information provided.  Hospitalized 01/31/2019 to 02/05/2019 for intermit body shaking; +UTI, AKI on CKD IV improved with IVF/Lasix held.   Hospitalized 02/26/2019 to 03/07/2019 not eating, drinking, ARF with infection. Treated infection but creatinine did not improve. FTT, severe dementia   04/06/2019 ED visit for diarrhea with history of c-diff and recent antibiotics for UTI  04/15/2019 ED visit for abnormal labs potassium 5.5 and creatinine of 3.1 which was close to what they were a week prior in nephrology office  Palliative Care was asked to help to continue to address goals of care.    CODE STATUS: DNR  PPS: 30% HOSPICE ELIGIBILITY/DIAGNOSIS: Yes after review by Hospice Physicians/Dementia, abnormal weight loss  PAST MEDICAL HISTORY:  Past Medical History:  Diagnosis Date  . Anemia   . C. difficile colitis   .  Cancer of kidney ([place])   . Dementia ([place])   . GERD (gastroesophageal reflux disease)   . Hypertension   . Renal disorder   . Uterine cancer ([place])     SOCIAL HX:  Social History   Tobacco Use  . Smoking status: Former Research scientist (life sciences)  . Smokeless tobacco: Never Used  Substance Use Topics  . Alcohol use: No    ALLERGIES:  Allergies  Allergen Reactions  . Ace Inhibitors Cough  . Latex Swelling  . Lactose Intolerance (Gi)   . Lisinopril Cough     PERTINENT MEDICATIONS:  Outpatient Encounter Medications as of 04/24/2019  Medication Sig  . amLODipine (NORVASC) 5 MG tablet Take 1 tablet (5 mg total) by mouth daily. New blood pressure medication.  Marland Kitchen atorvastatin (LIPITOR) 40 MG tablet Take 40 mg by mouth at bedtime.   . calcitRIOL (ROCALTROL) 0.25 MCG capsule Take 0.25 mcg by mouth daily.  . calcium elemental as carbonate (TUMS ULTRA 1000) 400 MG chewable tablet Chew 1,000 mg by mouth 2 (two) times daily.   . cholecalciferol (VITAMIN D3) 25 MCG (1000 UT) tablet Take 1,000 Units by mouth daily.  . citalopram (CELEXA) 20 MG tablet Take 20 mg by mouth daily.  Marland Kitchen donepezil (ARICEPT) 5 MG tablet Take 5 mg by mouth at bedtime.   . feeding supplement, ENSURE ENLIVE, (ENSURE ENLIVE) LIQD Take 237 mLs by mouth 2 (two) times daily between meals.  . folic acid (FOLVITE) 1 MG tablet Take 1 mg by mouth 2 (two) times daily.   . furosemide (LASIX) 20 MG tablet Hold until follow up with kidney doctor.  . metoprolol tartrate (  LOPRESSOR) 25 MG tablet Take 25 mg by mouth 2 (two) times daily.  . mycophenolate (MYFORTIC) 180 MG EC tablet Take 180 mg by mouth 2 (two) times daily.  . potassium chloride (KLOR-CON) 10 MEQ tablet Hold until follow up with kidney doctor  . Saccharomyces boulardii (PROBIOTIC) 250 MG CAPS Take 1 capsule by mouth 2 (two) times daily.  . sirolimus (RAPAMUNE) 1 MG tablet Take 3 mg by mouth daily.   . traZODone (DESYREL) 50 MG tablet Take 50 mg by mouth at bedtime.    No  facility-administered encounter medications on file as of 04/24/2019.    PHYSICAL EXAM:   Deferred Declyn Delsol Z Alithea Lapage, NP

## 2019-04-25 ENCOUNTER — Other Ambulatory Visit: Payer: Self-pay

## 2019-04-25 ENCOUNTER — Telehealth: Payer: Self-pay | Admitting: Nurse Practitioner

## 2019-04-25 NOTE — Telephone Encounter (Signed)
Received verbal order from Caryl Pina, Dr Edwina Barth nurse for hospice order and Dr Edwina Barth agree to remain attending

## 2019-04-29 ENCOUNTER — Ambulatory Visit: Payer: Medicare Other | Admitting: Physician Assistant

## 2019-05-02 ENCOUNTER — Other Ambulatory Visit: Payer: Medicare Other | Admitting: Nurse Practitioner

## 2019-05-06 ENCOUNTER — Ambulatory Visit: Payer: Medicare Other | Admitting: Physician Assistant

## 2019-06-15 DEATH — deceased

## 2020-06-09 IMAGING — CT CT ABDOMEN AND PELVIS WITHOUT CONTRAST
2 of 4 series · 16 of 46 positions shown, 18 images · non-contrast
Comparison: CT of the abdomen pelvis dated 08/12/2018.

CLINICAL DATA: 67-year-old female with history of renal transplant
presenting with right red blood per rectum.

EXAM:
CT ABDOMEN AND PELVIS WITHOUT CONTRAST
TECHNIQUE: Multidetector CT imaging of the abdomen and pelvis was performed
following the standard protocol without IV contrast.

[Series 2: axial st · axial · 0.76mm/px · z∈[-855,-475]mm · 13 of 84 slices shown, 15 images]
[im 4/84  soft-tissue]
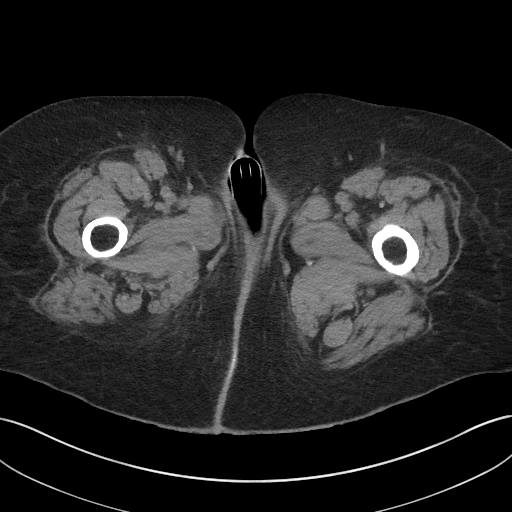
[im 4/84  bone]
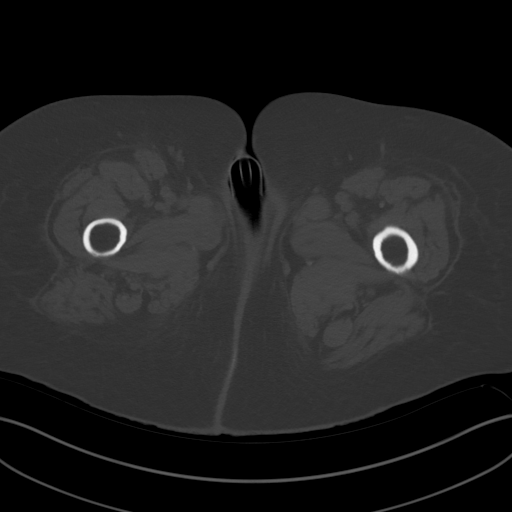
[im 11/84  soft-tissue]
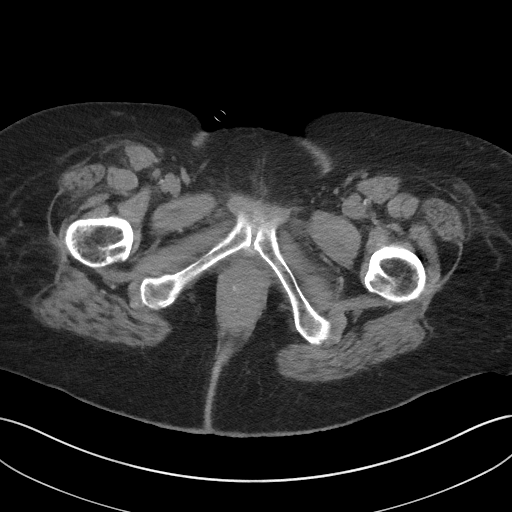
[im 18/84  soft-tissue]
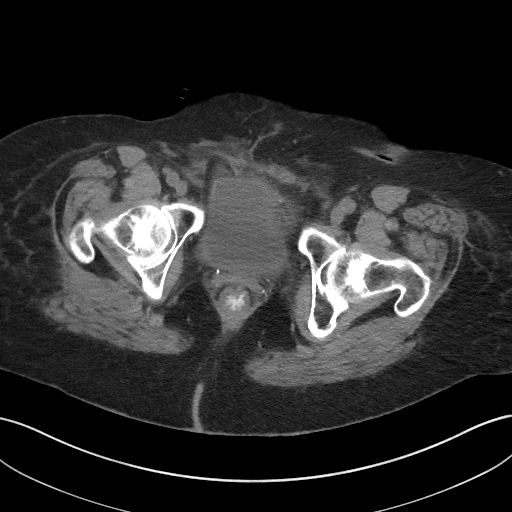
[im 25/84  soft-tissue]
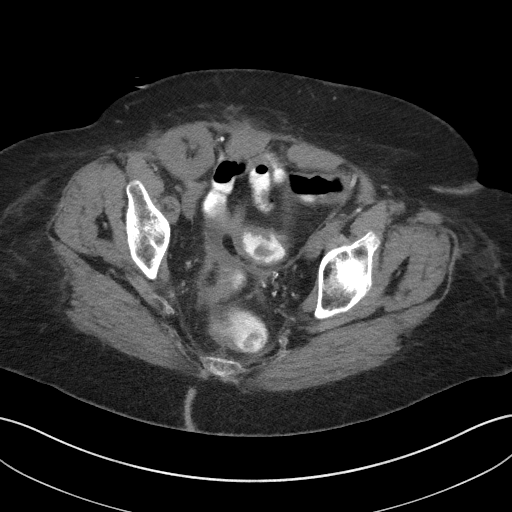
[im 28/84  soft-tissue]
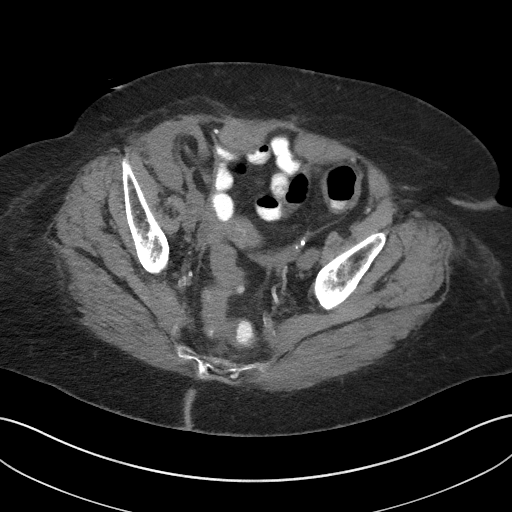
[im 35/84  soft-tissue]
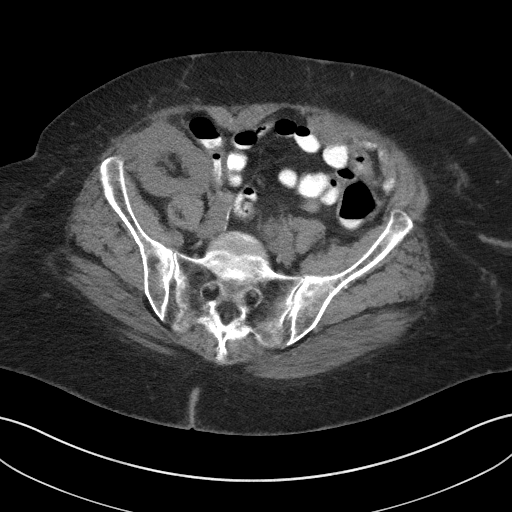
[im 42/84  soft-tissue]
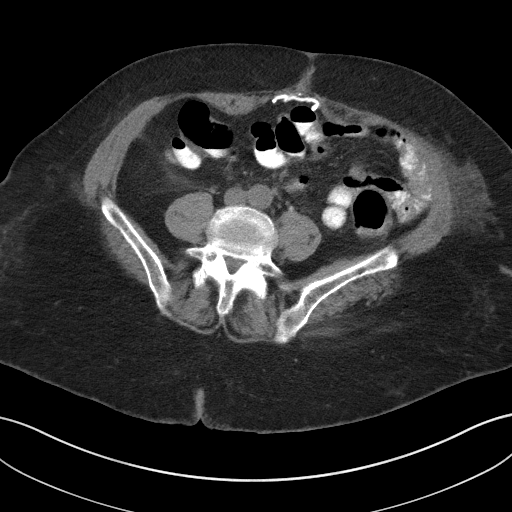
[im 49/84  soft-tissue]
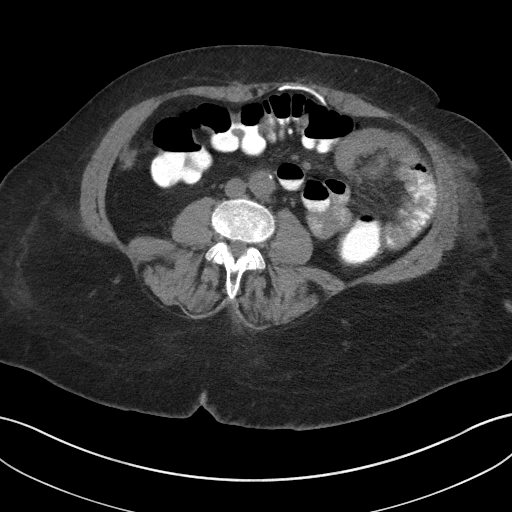
[im 56/84  soft-tissue]
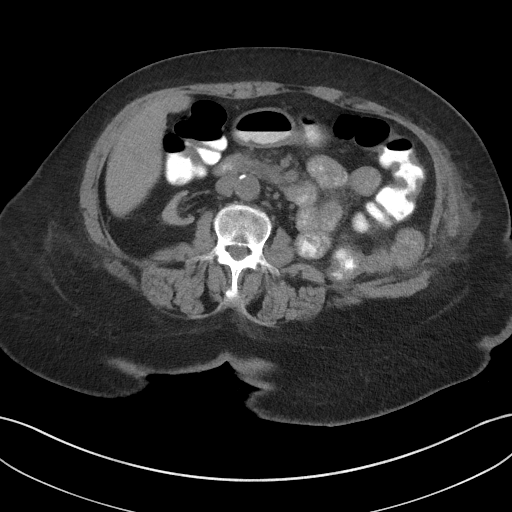
[im 56/84  bone]
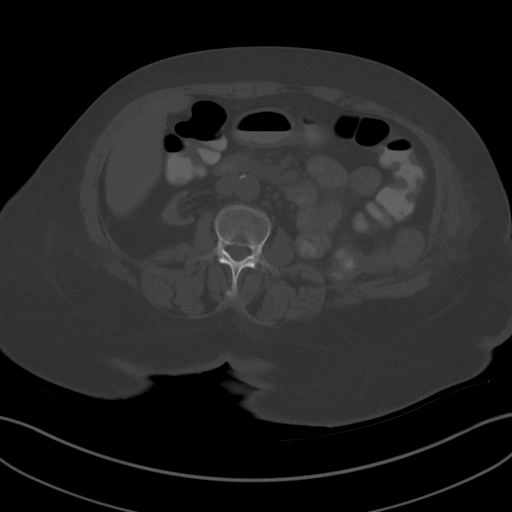
[im 59/84  soft-tissue]
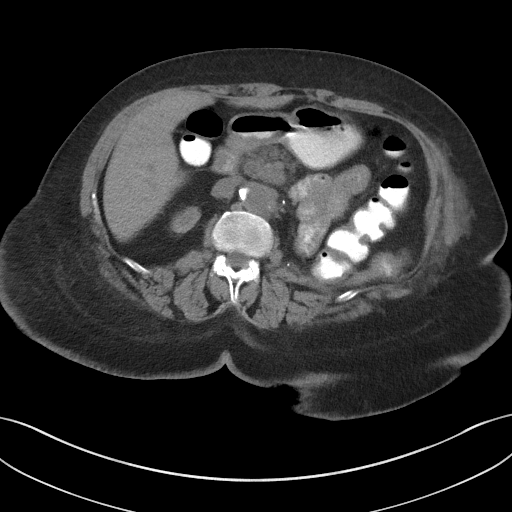
[im 66/84  soft-tissue]
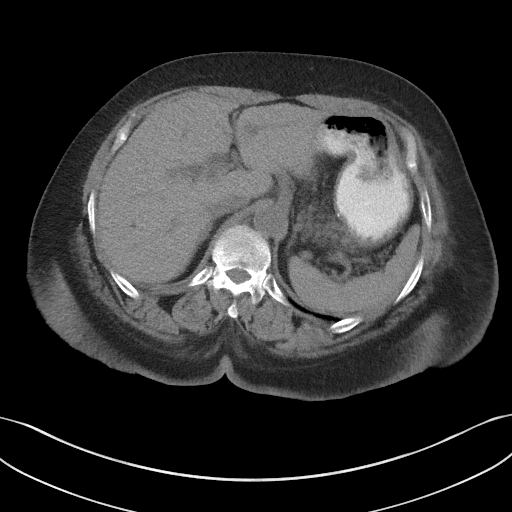
[im 73/84  soft-tissue]
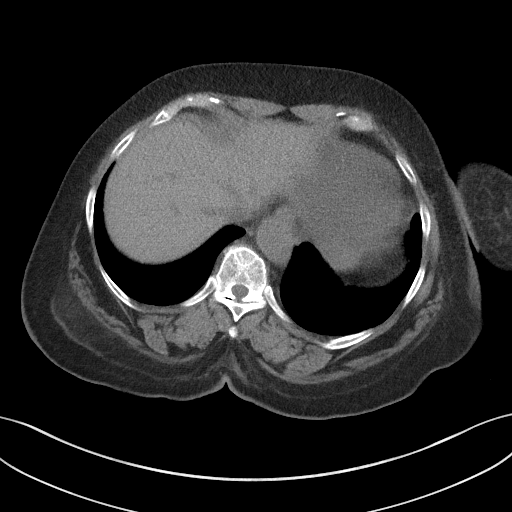
[im 80/84  soft-tissue]
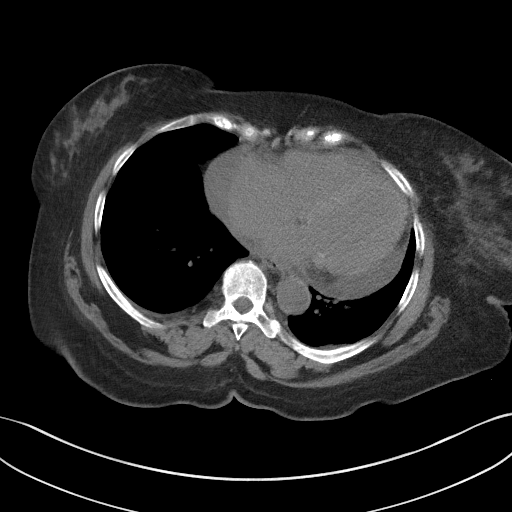

[Series 5: coronal st · coronal · 0.81mm/px · 3 of 100 slices shown]
[im 34/100  soft-tissue]
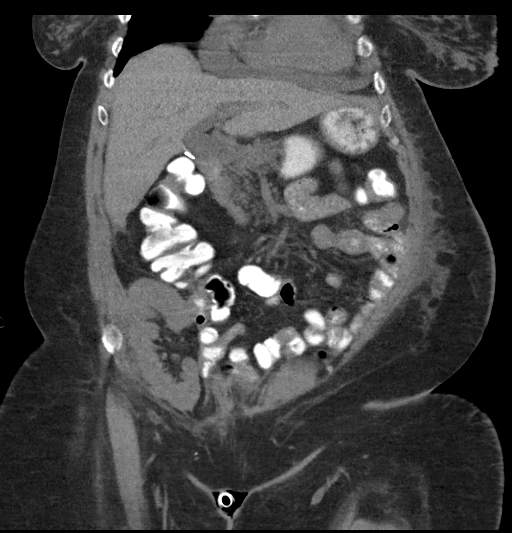
[im 45/100  soft-tissue]
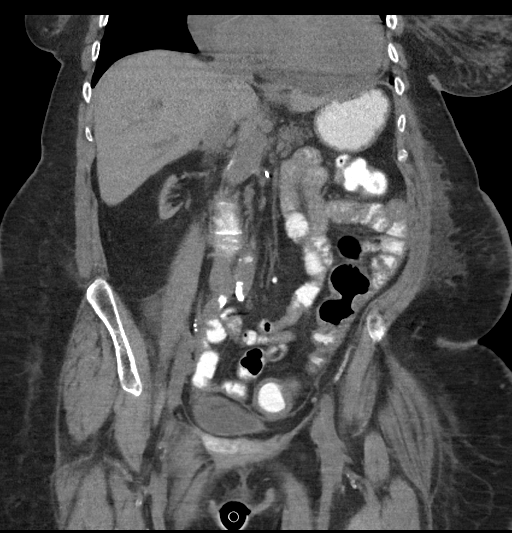
[im 56/100  soft-tissue]
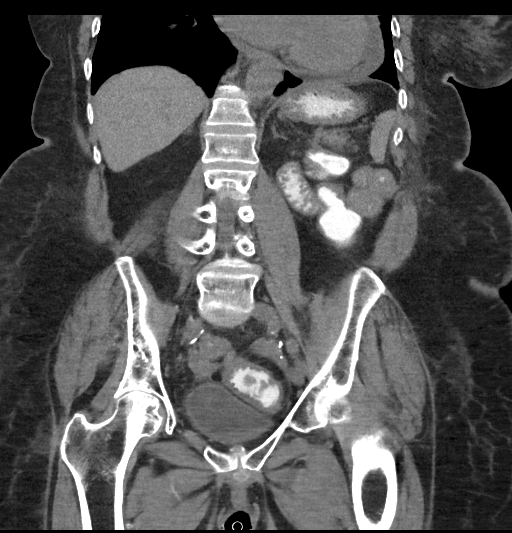

[16 of 46 positions shown; findings below may reference images not displayed]

FINDINGS: Evaluation of this exam is limited in the absence of intravenous
contrast.

Lower chest: Probable trace bilateral pleural effusions versus
pleural thickening. The visualized lung bases are otherwise clear.
There is mild cardiomegaly and coronary vascular calcification.
There is hypoattenuation of the cardiac blood pool suggestive of a
degree of anemia. Clinical correlation is recommended. There is
partially visualized pericardial effusion relatively similar to
prior CT measuring up to 2.2 cm in thickness.

No intra-abdominal free air or free fluid.

Hepatobiliary: The liver is unremarkable. Cholecystectomy. No
retained calcified stone noted in the central CBD.

Pancreas: Unremarkable. No pancreatic ductal dilatation or
surrounding inflammatory changes.

Spleen: Normal in size without focal abnormality.

Adrenals/Urinary Tract: The adrenal glands are unremarkable.
Atrophic native right kidney. Postsurgical changes of left
nephrectomy. There is a right lower quadrant transplant kidney.
There is no hydronephrosis or nephrolithiasis of the transplant
kidney. No peritransplant fluid collection. The urinary bladder is
partially distended. There is slight haziness of the bladder wall.
Correlation with urinalysis recommended to exclude cystitis.

Stomach/Bowel: Mild thickened appearance of the distal colon
concerning for mild colitis. Several small scattered sigmoid
diverticula without active inflammatory changes noted. There is no
bowel obstruction. The appendix is not visualized with certainty. No
inflammatory changes identified in the right lower quadrant.

Vascular/Lymphatic: Mild aortoiliac atherosclerotic disease. No
portal venous gas. There is no adenopathy.

Reproductive: Hysterectomy. No pelvic mass.

Other: Midline vertical anterior pelvic wall incisional scar. A
ventral hernia repair mesh noted.

Musculoskeletal: Osteopenia. No acute osseous pathology. Bilateral
femoral head avascular necrosis, right greater left. No cortical
collapse or fracture.
IMPRESSION: 1. Slight thickened appearance of the distal colon concerning for
mild colitis. No bowel obstruction.
2. Small sigmoid diverticula without active inflammatory changes.
3. Mild haziness of the bladder wall. Correlation with urinalysis
recommended to exclude cystitis.
4. Right lower quadrant transplant kidney. No hydronephrosis or
nephrolithiasis.
5. Bilateral femoral head avascular necrosis, right greater left. No
cortical collapse or fracture.
6. Aortic Atherosclerosis ([id number]).
